# Patient Record
Sex: Female | Born: 1950 | Race: Black or African American | Hispanic: No | Marital: Single | Smoking: Never smoker
Health system: Southern US, Community
[De-identification: ages and names within clinical notes are randomized; demographics above are authoritative.]

## PROBLEM LIST (undated history)

## (undated) DIAGNOSIS — A159 Respiratory tuberculosis unspecified: Secondary | ICD-10-CM

## (undated) DIAGNOSIS — H269 Unspecified cataract: Secondary | ICD-10-CM

## (undated) DIAGNOSIS — H409 Unspecified glaucoma: Secondary | ICD-10-CM

## (undated) DIAGNOSIS — N189 Chronic kidney disease, unspecified: Secondary | ICD-10-CM

## (undated) DIAGNOSIS — M8580 Other specified disorders of bone density and structure, unspecified site: Secondary | ICD-10-CM

## (undated) HISTORY — DX: Respiratory tuberculosis unspecified: A15.9

## (undated) HISTORY — DX: Chronic kidney disease, unspecified: N18.9

## (undated) HISTORY — DX: Unspecified glaucoma: H40.9

## (undated) HISTORY — DX: Unspecified cataract: H26.9

## (undated) HISTORY — DX: Other specified disorders of bone density and structure, unspecified site: M85.80

## (undated) HISTORY — PX: TUBAL LIGATION: SHX77

---

## 2000-08-18 ENCOUNTER — Other Ambulatory Visit: Admission: RE | Admit: 2000-08-18 | Discharge: 2000-08-18 | Payer: Self-pay | Admitting: Obstetrics and Gynecology

## 2002-09-20 ENCOUNTER — Other Ambulatory Visit: Admission: RE | Admit: 2002-09-20 | Discharge: 2002-09-20 | Payer: Self-pay | Admitting: Obstetrics and Gynecology

## 2004-05-21 ENCOUNTER — Ambulatory Visit (HOSPITAL_COMMUNITY): Admission: RE | Admit: 2004-05-21 | Discharge: 2004-05-21 | Payer: Self-pay | Admitting: Obstetrics and Gynecology

## 2004-06-14 ENCOUNTER — Other Ambulatory Visit: Admission: RE | Admit: 2004-06-14 | Discharge: 2004-06-14 | Payer: Self-pay | Admitting: Obstetrics and Gynecology

## 2009-04-21 ENCOUNTER — Other Ambulatory Visit: Admission: RE | Admit: 2009-04-21 | Discharge: 2009-04-21 | Payer: Self-pay | Admitting: Obstetrics and Gynecology

## 2009-04-21 ENCOUNTER — Ambulatory Visit: Payer: Self-pay | Admitting: Obstetrics and Gynecology

## 2009-04-21 ENCOUNTER — Encounter: Payer: Self-pay | Admitting: Obstetrics and Gynecology

## 2009-05-05 ENCOUNTER — Ambulatory Visit: Payer: Self-pay | Admitting: Obstetrics and Gynecology

## 2009-06-09 ENCOUNTER — Ambulatory Visit: Payer: Self-pay | Admitting: Obstetrics and Gynecology

## 2010-04-22 ENCOUNTER — Other Ambulatory Visit: Admission: RE | Admit: 2010-04-22 | Discharge: 2010-04-22 | Payer: Self-pay | Admitting: Obstetrics and Gynecology

## 2010-04-22 ENCOUNTER — Ambulatory Visit: Payer: Self-pay | Admitting: Obstetrics and Gynecology

## 2011-08-02 ENCOUNTER — Encounter: Payer: Self-pay | Admitting: Obstetrics and Gynecology

## 2012-08-07 ENCOUNTER — Encounter: Payer: Self-pay | Admitting: Women's Health

## 2012-08-08 ENCOUNTER — Other Ambulatory Visit: Payer: Self-pay | Admitting: *Deleted

## 2012-08-08 ENCOUNTER — Encounter: Payer: Self-pay | Admitting: Women's Health

## 2012-08-08 DIAGNOSIS — R928 Other abnormal and inconclusive findings on diagnostic imaging of breast: Secondary | ICD-10-CM

## 2012-08-23 ENCOUNTER — Encounter: Payer: Self-pay | Admitting: Women's Health

## 2012-08-23 ENCOUNTER — Ambulatory Visit (INDEPENDENT_AMBULATORY_CARE_PROVIDER_SITE_OTHER): Payer: PRIVATE HEALTH INSURANCE | Admitting: Women's Health

## 2012-08-23 ENCOUNTER — Other Ambulatory Visit (HOSPITAL_COMMUNITY)
Admission: RE | Admit: 2012-08-23 | Discharge: 2012-08-23 | Disposition: A | Discharge status: Home / Self Care | Payer: PRIVATE HEALTH INSURANCE | Source: Ambulatory Visit | Attending: Obstetrics and Gynecology | Admitting: Obstetrics and Gynecology

## 2012-08-23 VITALS — BP 128/80 | Ht 66.75 in | Wt 150.0 lb

## 2012-08-23 DIAGNOSIS — Z01419 Encounter for gynecological examination (general) (routine) without abnormal findings: Secondary | ICD-10-CM

## 2012-08-23 DIAGNOSIS — M899 Disorder of bone, unspecified: Secondary | ICD-10-CM

## 2012-08-23 DIAGNOSIS — Z113 Encounter for screening for infections with a predominantly sexual mode of transmission: Secondary | ICD-10-CM

## 2012-08-23 DIAGNOSIS — Z780 Asymptomatic menopausal state: Secondary | ICD-10-CM

## 2012-08-23 DIAGNOSIS — Z833 Family history of diabetes mellitus: Secondary | ICD-10-CM

## 2012-08-23 DIAGNOSIS — Z13220 Encounter for screening for lipoid disorders: Secondary | ICD-10-CM

## 2012-08-23 DIAGNOSIS — M949 Disorder of cartilage, unspecified: Secondary | ICD-10-CM

## 2012-08-23 DIAGNOSIS — M8580 Other specified disorders of bone density and structure, unspecified site: Secondary | ICD-10-CM | POA: Insufficient documentation

## 2012-08-23 DIAGNOSIS — E079 Disorder of thyroid, unspecified: Secondary | ICD-10-CM

## 2012-08-23 LAB — TSH: TSH: 1.176 u[IU]/mL (ref 0.350–4.500)

## 2012-08-23 LAB — COMPREHENSIVE METABOLIC PANEL
ALT: 9 U/L (ref 0–35)
AST: 16 U/L (ref 0–37)
Albumin: 4.5 g/dL (ref 3.5–5.2)
Alkaline Phosphatase: 84 U/L (ref 39–117)
BUN: 15 mg/dL (ref 6–23)
Calcium: 9.7 mg/dL (ref 8.4–10.5)
Chloride: 108 mEq/L (ref 96–112)
Creat: 1.01 mg/dL (ref 0.50–1.10)
Glucose, Bld: 71 mg/dL (ref 70–99)
Potassium: 4.3 mEq/L (ref 3.5–5.3)
Total Bilirubin: 0.5 mg/dL (ref 0.3–1.2)
Total Protein: 6.8 g/dL (ref 6.0–8.3)

## 2012-08-23 LAB — CBC WITH DIFFERENTIAL/PLATELET
Basophils Absolute: 0.1 10*3/uL (ref 0.0–0.1)
Eosinophils Relative: 3 % (ref 0–5)
HCT: 41.2 % (ref 36.0–46.0)
Lymphocytes Relative: 47 % — ABNORMAL HIGH (ref 12–46)
Lymphs Abs: 1.7 10*3/uL (ref 0.7–4.0)
MCH: 28 pg (ref 26.0–34.0)
MCV: 84.1 fL (ref 78.0–100.0)
Monocytes Absolute: 0.3 10*3/uL (ref 0.1–1.0)
Neutro Abs: 1.5 10*3/uL — ABNORMAL LOW (ref 1.7–7.7)
Neutrophils Relative %: 41 % — ABNORMAL LOW (ref 43–77)
Platelets: 285 10*3/uL (ref 150–400)
RBC: 4.9 MIL/uL (ref 3.87–5.11)
RDW: 14.1 % (ref 11.5–15.5)
WBC: 3.7 10*3/uL — ABNORMAL LOW (ref 4.0–10.5)

## 2012-08-23 LAB — LIPID PANEL
HDL: 42 mg/dL (ref >39)
LDL Cholesterol: 97 mg/dL (ref 0–99)
Triglycerides: 127 mg/dL (ref <150)

## 2012-08-23 LAB — HEPATITIS C ANTIBODY: HCV Ab: NEGATIVE

## 2012-08-23 MED ORDER — CLOBETASOL PROPIONATE 0.05 % EX CREA: TOPICAL_CREAM | CUTANEOUS | Status: DC

## 2012-08-23 NOTE — Progress Notes (Signed)
REESA [name] December 09, 1950 [id number]    History:    The patient presents for annual exam.  Postmenopausal/no bleeding/ no HRT/new partner. Has not had a colonoscopy. Normal pap and mammogram history. Osteopenia/2010 - DEXA T score-1.5 at spine, total hip average -0.9.   Past medical history, past surgical history, family history and social history were all reviewed and documented in the EPIC chart. Engineer, site. 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   ROS:  A  ROS was performed and pertinent positives and negatives are included in the history.  Exam:  Filed Vitals:   08/23/12 0813  BP: 128/80    General appearance:  Normal Head/Neck:  Normal, without cervical or supraclavicular adenopathy. Thyroid:  Symmetrical, normal in size, without palpable masses or nodularity. Respiratory  Effort:  Normal  Auscultation:  Clear without wheezing or rhonchi Cardiovascular  Auscultation:  Regular rate, without rubs, murmurs or gallops  Edema/varicosities:  Not grossly evident Abdominal  Soft,nontender, without masses, guarding or rebound.  Liver/spleen:  No organomegaly noted  Hernia:  None appreciated  Skin  Inspection:  Grossly normal  Palpation:  Grossly normal Neurologic/psychiatric  Orientation:  Normal with appropriate conversation.  Mood/affect:  Normal  Genitourinary    Breasts: Examined lying and sitting.     Right: Without masses, retractions, discharge or axillary adenopathy.     Left: Without masses, retractions, discharge or axillary adenopathy.   Inguinal/mons:  Normal without inguinal adenopathy  External genitalia:  Erythematous/loss of pigment bilateral inguinal creases  BUS/Urethra/Skene's glands:  Normal  Bladder:  Normal  Vagina:  Normal  Cervix:  Normal  Uterus:   normal in size, shape and contour.  Midline and mobile  Adnexa/parametria:     Rt: Without masses or tenderness.   Lt: Without masses or tenderness.  Anus and perineum: Normal  Digital  rectal exam: Normal sphincter tone without palpated masses or tenderness  Assessment/Plan:  62 y.o. WBF G6P3 for annual exam with no complaints.     Postmenopausal/no bleeding/no HRT STD screen osteopenia  Plan: Instructed to schedule colonoscopy, reviewed importance of  screening. SBE's, schedule annual mammogram which is overdue. Reviewed importance of screening. Continue regular exercise, calcium rich diet, vitamin D 2000 daily. Home safety and fall prevention discussed. Repeat DEXA, instructed to schedule here. CBC, glucose, lipid panel, UA, Pap, GC/Chlamydia, hep C, declines HIV.Paps normal 2011, new screening guidelines reviewed. Instructed to use small amount of Temovate cream to erythematous areas on labia instructed to call if no relief of symptoms.    Denise [name] WHNP, 1:35 PM 08/23/2012

## 2012-08-23 NOTE — Patient Instructions (Signed)

## 2012-08-24 LAB — URINALYSIS W MICROSCOPIC + REFLEX CULTURE
Bacteria, UA: NONE SEEN
Bilirubin Urine: NEGATIVE
Casts: NONE SEEN
Crystals: NONE SEEN
Glucose, UA: NEGATIVE mg/dL
Hgb urine dipstick: NEGATIVE
Leukocytes, UA: NEGATIVE
Protein, ur: NEGATIVE mg/dL
Specific Gravity, Urine: 1.014 (ref 1.005–1.030)
Squamous Epithelial / HPF: NONE SEEN
Urobilinogen, UA: 0.2 mg/dL (ref 0.0–1.0)
pH: 5 (ref 5.0–8.0)

## 2012-08-24 LAB — GC/CHLAMYDIA PROBE AMP
CT Probe RNA: NEGATIVE
GC Probe RNA: NEGATIVE

## 2012-09-01 DIAGNOSIS — M8580 Other specified disorders of bone density and structure, unspecified site: Secondary | ICD-10-CM

## 2012-09-01 HISTORY — DX: Other specified disorders of bone density and structure, unspecified site: M85.80

## 2012-09-20 ENCOUNTER — Other Ambulatory Visit: Payer: Self-pay | Admitting: Gynecology

## 2012-09-20 ENCOUNTER — Encounter: Payer: Self-pay | Admitting: Gynecology

## 2012-09-20 ENCOUNTER — Ambulatory Visit (INDEPENDENT_AMBULATORY_CARE_PROVIDER_SITE_OTHER): Payer: PRIVATE HEALTH INSURANCE

## 2012-09-20 DIAGNOSIS — Z780 Asymptomatic menopausal state: Secondary | ICD-10-CM

## 2012-09-20 DIAGNOSIS — M949 Disorder of cartilage, unspecified: Secondary | ICD-10-CM

## 2012-09-20 DIAGNOSIS — M899 Disorder of bone, unspecified: Secondary | ICD-10-CM

## 2012-09-20 DIAGNOSIS — M8580 Other specified disorders of bone density and structure, unspecified site: Secondary | ICD-10-CM

## 2012-09-27 ENCOUNTER — Ambulatory Visit (INDEPENDENT_AMBULATORY_CARE_PROVIDER_SITE_OTHER): Payer: PRIVATE HEALTH INSURANCE | Admitting: Family Medicine

## 2012-09-27 VITALS — BP 110/70 | HR 72 | Temp 98.2°F | Resp 18 | Ht 66.75 in | Wt 151.8 lb

## 2012-09-27 DIAGNOSIS — J0100 Acute maxillary sinusitis, unspecified: Secondary | ICD-10-CM

## 2012-09-27 MED ORDER — BENZONATATE 100 MG PO CAPS: 100.0000 mg | ORAL_CAPSULE | Freq: Three times a day (TID) | ORAL | Status: DC | PRN

## 2012-09-27 MED ORDER — AMOXICILLIN-POT CLAVULANATE 875-125 MG PO TABS: 1.0000 | ORAL_TABLET | Freq: Two times a day (BID) | ORAL | Status: DC

## 2012-09-27 MED ORDER — IPRATROPIUM BROMIDE 0.03 % NA SOLN: 2.0000 | Freq: Two times a day (BID) | NASAL | Status: DC

## 2012-09-27 NOTE — Patient Instructions (Addendum)
Acute maxillary sinusitis - Plan: amoxicillin-clavulanate (AUGMENTIN) 875-125 MG per tablet, ipratropium (ATROVENT) 0.03 % nasal spray  Sinusitis Sinusitis is redness, soreness, and swelling (inflammation) of the paranasal sinuses. Paranasal sinuses are air pockets within the bones of your face (beneath the eyes, the middle of the forehead, or above the eyes). In healthy paranasal sinuses, mucus is able to drain out, and air is able to circulate through them by way of your nose. However, when your paranasal sinuses are inflamed, mucus and air can become trapped. This can allow bacteria and other germs to grow and cause infection. Sinusitis can develop quickly and last only a short time (acute) or continue over a long period (chronic). Sinusitis that lasts for more than 12 weeks is considered chronic.  CAUSES  Causes of sinusitis include:  Allergies.  Structural abnormalities, such as displacement of the cartilage that separates your nostrils (deviated septum), which can decrease the air flow through your nose and sinuses and affect sinus drainage.  Functional abnormalities, such as when the small hairs (cilia) that line your sinuses and help remove mucus do not work properly or are not present. SYMPTOMS  Symptoms of acute and chronic sinusitis are the same. The primary symptoms are pain and pressure around the affected sinuses. Other symptoms include:  Upper toothache.  Earache.  Headache.  Bad breath.  Decreased sense of smell and taste.  A cough, which worsens when you are lying flat.  Fatigue.  Fever.  Thick drainage from your nose, which often is green and may contain pus (purulent).  Swelling and warmth over the affected sinuses. DIAGNOSIS  Your caregiver will perform a physical exam. During the exam, your caregiver may:  Look in your nose for signs of abnormal growths in your nostrils (nasal polyps).  Tap over the affected sinus to check for signs of infection.  View  the inside of your sinuses (endoscopy) with a special imaging device with a light attached (endoscope), which is inserted into your sinuses. If your caregiver suspects that you have chronic sinusitis, one or more of the following tests may be recommended:  Allergy tests.  Nasal culture A sample of mucus is taken from your nose and sent to a lab and screened for bacteria.  Nasal cytology A sample of mucus is taken from your nose and examined by your caregiver to determine if your sinusitis is related to an allergy. TREATMENT  Most cases of acute sinusitis are related to a viral infection and will resolve on their own within 10 days. Sometimes medicines are prescribed to help relieve symptoms (pain medicine, decongestants, nasal steroid sprays, or saline sprays).  However, for sinusitis related to a bacterial infection, your caregiver will prescribe antibiotic medicines. These are medicines that will help kill the bacteria causing the infection.  Rarely, sinusitis is caused by a fungal infection. In theses cases, your caregiver will prescribe antifungal medicine. For some cases of chronic sinusitis, surgery is needed. Generally, these are cases in which sinusitis recurs more than 3 times per year, despite other treatments. HOME CARE INSTRUCTIONS   Drink plenty of water. Water helps thin the mucus so your sinuses can drain more easily.  Use a humidifier.  Inhale steam 3 to 4 times a day (for example, sit in the bathroom with the shower running).  Apply a warm, moist washcloth to your face 3 to 4 times a day, or as directed by your caregiver.  Use saline nasal sprays to help moisten and clean your sinuses.  Take  over-the-counter or prescription medicines for pain, discomfort, or fever only as directed by your caregiver. SEEK IMMEDIATE MEDICAL CARE IF:  You have increasing pain or severe headaches.  You have nausea, vomiting, or drowsiness.  You have swelling around your face.  You have  vision problems.  You have a stiff neck.  You have difficulty breathing. MAKE SURE YOU:   Understand these instructions.  Will watch your condition.  Will get help right away if you are not doing well or get worse. Document Released: 06/20/2005 Document Revised: 09/12/2011 Document Reviewed: 07/05/2011 [facility] Patient Information 2013 [place], [place].

## 2012-09-27 NOTE — Progress Notes (Signed)
[address]   [place], [place]  [address]   [id number]  Subjective:    Patient ID: Denise [name], female    DOB: 16-Jun-1951, 62 y.o.   MRN: [id number]  HPI This 62 y.o. female presents for evaluation of sinus congestion.  Onset two weeks ago.  Progressively worsening.  No fever/chills/sweats.  No headache but sinus pressure.  +R ear pain; no sore throat.   +rhinorrhea yellow green.  +nasal congestion.  +PND.  +coughing a lot; +sputum unknown.  No SOB.  No v/d.  Tried Mucinex with excessive drying.  No sneezing; no itchy eyes or nose.  No body aches.  No history of seasonal allergies.  PCP: [facility]    Review of Systems  Constitutional: Negative for fever, chills, diaphoresis and fatigue.  HENT: Positive for ear pain, congestion, rhinorrhea, voice change, postnasal drip and sinus pressure. Negative for sore throat, sneezing and trouble swallowing.   Respiratory: Positive for cough and shortness of breath. Negative for wheezing.   Gastrointestinal: Negative for nausea, vomiting and diarrhea.  Neurological: Negative for headaches.        Past Medical History  Diagnosis Date  . Osteopenia 09/2012     T score of -1.2 FRAX 6.6%/0.3%    Past Surgical History  Procedure Laterality Date  . Tubal ligation      Prior to Admission medications   Medication Sig Start Date End Date Taking? Authorizing Provider  amoxicillin-clavulanate (AUGMENTIN) 875-125 MG per tablet Take 1 tablet by mouth 2 (two) times daily. 09/27/12   Ethelda Chick, MD  benzonatate (TESSALON) 100 MG capsule Take 1-2 capsules (100-200 mg total) by mouth 3 (three) times daily as needed for cough. 09/27/12   Ethelda Chick, MD  clobetasol cream (TEMOVATE) 0.05 % Apply small amt twice daily as needed 08/23/12   Harrington Challenger, NP  ipratropium (ATROVENT) 0.03 % nasal spray Place 2 sprays into the nose 2 (two) times daily. 09/27/12   Ethelda Chick, MD    No Known Allergies  History   Social History  . Marital Status:  Single    Spouse Name: N/A    Number of Children: N/A  . Years of Education: N/A   Occupational History  . Not on file.   Social History Main Topics  . Smoking status: Never Smoker   . Smokeless tobacco: Never Used  . Alcohol Use: Yes     Comment: occ- wine  . Drug Use: No  . Sexually Active: Yes   Other Topics Concern  . Not on file   Social History Narrative  . No narrative on file    Family History  Problem Relation Age of Onset  . Hypertension Mother   . Heart disease Mother   . Diabetes Father   . Hypertension Father   . Diabetes Sister   . Tuberculosis Maternal Grandmother   . Heart disease Sister     Objective:   Physical Exam  Nursing note and vitals reviewed. Constitutional: She is oriented to person, place, and time. She appears well-developed and well-nourished. No distress.  HENT:  Head: Normocephalic and atraumatic.  Right Ear: External ear normal.  Left Ear: External ear normal.  Nose: Mucosal edema and rhinorrhea present. Right sinus exhibits no maxillary sinus tenderness and no frontal sinus tenderness. Left sinus exhibits no maxillary sinus tenderness and no frontal sinus tenderness.  Mouth/Throat: Oropharynx is clear and moist.  Eyes: Conjunctivae are normal. Pupils are equal, round, and reactive to light.  Neck: Normal range of motion. Neck supple.  Cardiovascular: Normal rate, regular rhythm and normal heart sounds.   No murmur heard. Pulmonary/Chest: Effort normal and breath sounds normal.  Lymphadenopathy:    She has no cervical adenopathy.  Neurological: She is alert and oriented to person, place, and time. No cranial nerve deficit. She exhibits normal muscle tone. Coordination normal.  Skin: She is not diaphoretic.  Psychiatric: She has a normal mood and affect. Her behavior is normal.       Assessment & Plan:  Acute maxillary sinusitis - Plan: amoxicillin-clavulanate (AUGMENTIN) 875-125 MG per tablet, ipratropium (ATROVENT) 0.03 %  nasal spray   1. Acute maxillary sinusitis:  New.  Rx for Augmentin, Tessalon Perles, Atrovent nasal spray provided. Supportive care with rest, fluids, Tylenol or Motrin.  Meds ordered this encounter  Medications  . amoxicillin-clavulanate (AUGMENTIN) 875-125 MG per tablet    Sig: Take 1 tablet by mouth 2 (two) times daily.    Dispense:  20 tablet    Refill:  0  . ipratropium (ATROVENT) 0.03 % nasal spray    Sig: Place 2 sprays into the nose 2 (two) times daily.    Dispense:  30 mL    Refill:  5  . benzonatate (TESSALON) 100 MG capsule    Sig: Take 1-2 capsules (100-200 mg total) by mouth 3 (three) times daily as needed for cough.    Dispense:  60 capsule    Refill:  0

## 2012-10-09 ENCOUNTER — Telehealth: Payer: Self-pay | Admitting: *Deleted

## 2012-10-09 NOTE — Telephone Encounter (Signed)
Pt sent a letter asking questions about her bone density since she received our summary of report. She asked what her Tscores were since she had significant decreases in all areas. I went over her hips are still in normal range and her spine is barely Osteopenic. We discussed exercise and calcium and vit D intake. KW

## 2012-10-29 ENCOUNTER — Encounter: Payer: Self-pay | Admitting: Family Medicine

## 2012-10-31 ENCOUNTER — Ambulatory Visit (INDEPENDENT_AMBULATORY_CARE_PROVIDER_SITE_OTHER): Payer: PRIVATE HEALTH INSURANCE | Admitting: Family Medicine

## 2012-10-31 VITALS — BP 130/72 | HR 66 | Temp 98.5°F | Resp 16 | Ht 67.0 in | Wt 152.0 lb

## 2012-10-31 DIAGNOSIS — R0982 Postnasal drip: Secondary | ICD-10-CM

## 2012-10-31 DIAGNOSIS — R05 Cough: Secondary | ICD-10-CM

## 2012-10-31 DIAGNOSIS — R490 Dysphonia: Secondary | ICD-10-CM

## 2012-10-31 DIAGNOSIS — R059 Cough, unspecified: Secondary | ICD-10-CM

## 2012-10-31 MED ORDER — BENZONATATE 100 MG PO CAPS: 100.0000 mg | ORAL_CAPSULE | Freq: Three times a day (TID) | ORAL | Status: DC | PRN

## 2012-10-31 MED ORDER — CETIRIZINE HCL 10 MG PO TABS: 10.0000 mg | ORAL_TABLET | Freq: Every day | ORAL | Status: DC

## 2012-10-31 MED ORDER — FLUTICASONE PROPIONATE 50 MCG/ACT NA SUSP: 2.0000 | Freq: Every day | NASAL | Status: DC

## 2012-10-31 NOTE — Progress Notes (Signed)
Subjective:    Patient ID: Denise [name], female    DOB: October 21, 1950, 62 y.o.   MRN: [id number] Chief Complaint  Patient presents with  . Cough    seen for sinusitis a month ago, can't kick the cough    HPI  Denise [name] is a delightful 62 yo woman who has been having a persistent cough and hoarseness for the past month since she was diagnosed and treated for sinusitis.  The sinus pressure and nasal congestion have been relieved.  Past Medical History  Diagnosis Date  . Osteopenia 09/2012     T score of -1.2 FRAX 6.6%/0.3%   Current Outpatient Prescriptions on File Prior to Visit  Medication Sig Dispense Refill  . clobetasol cream (TEMOVATE) 0.05 % Apply small amt twice daily as needed  30 g  1   No current facility-administered medications on file prior to visit.   No Known Allergies   Review of Systems  Constitutional: Positive for fatigue. Negative for fever, chills, diaphoresis and activity change.  HENT: Positive for trouble swallowing and voice change. Negative for congestion, sore throat and rhinorrhea.   Respiratory: Positive for cough. Negative for chest tightness, shortness of breath and wheezing.   Cardiovascular: Negative for chest pain, palpitations and leg swelling.  Musculoskeletal: Negative for back pain and arthralgias.  Hematological: Negative for adenopathy.  Psychiatric/Behavioral: Negative for sleep disturbance.      BP 130/72  Pulse 66  Temp(Src) 98.5 F (36.9 C) (Oral)  Resp 16  Ht 5\' 7"  (1.702 m)  Wt 152 lb (68.947 kg)  BMI 23.8 kg/m2  SpO2 100% Objective:   Physical Exam  Constitutional: She is oriented to person, place, and time. She appears well-developed and well-nourished. She does not appear ill. No distress.  HENT:  Head: Normocephalic and atraumatic.  Right Ear: Tympanic membrane, external ear and ear canal normal.  Left Ear: Tympanic membrane, external ear and ear canal normal.  Nose: Rhinorrhea present. No mucosal edema. Right sinus  exhibits no maxillary sinus tenderness. Left sinus exhibits no maxillary sinus tenderness.  Mouth/Throat: Uvula is midline and mucous membranes are normal. Posterior oropharyngeal erythema present. No oropharyngeal exudate or posterior oropharyngeal edema.  Hoarse gravely voice.  Eyes: Conjunctivae are normal. Right eye exhibits no discharge. Left eye exhibits no discharge. No scleral icterus.  Neck: Normal range of motion. Neck supple.  Cardiovascular: Normal rate, regular rhythm, normal heart sounds and intact distal pulses.   Pulmonary/Chest: Effort normal and breath sounds normal. Not tachypneic. No respiratory distress. She has no decreased breath sounds. She has no wheezes. She has no rhonchi. She has no rales.  Lymphadenopathy:    She has no cervical adenopathy.  Neurological: She is alert and oriented to person, place, and time.  Skin: Skin is warm and dry. She is not diaphoretic. No erythema.  Psychiatric: She has a normal mood and affect. Her behavior is normal.      Assessment & Plan:  Cough  Hoarseness, persistent  Postnasal drip - suspect sxs are due to persistent PND so start flonase and zyrtec. Cont prn tessalon as worked well prior.  If sxs continue for more than a month, rec referral to ENT.  Meds ordered this encounter  Medications  . benzonatate (TESSALON) 100 MG capsule    Sig: Take 1-2 capsules (100-200 mg total) by mouth 3 (three) times daily as needed for cough.    Dispense:  60 capsule    Refill:  0  . fluticasone (FLONASE) 50 MCG/ACT  nasal spray    Sig: Place 2 sprays into the nose daily.    Dispense:  16 g    Refill:  6  .  cetirizine (ZYRTEC) 10 MG tablet    Sig: Take 1 tablet (10 mg total) by mouth daily.    Dispense:  30 tablet    Refill:  11

## 2012-11-01 ENCOUNTER — Encounter: Payer: Self-pay | Admitting: Family Medicine

## 2012-11-02 MED ORDER — CETIRIZINE HCL 10 MG PO TABS: 10.0000 mg | ORAL_TABLET | Freq: Every day | ORAL | Status: DC

## 2012-12-11 ENCOUNTER — Ambulatory Visit (INDEPENDENT_AMBULATORY_CARE_PROVIDER_SITE_OTHER): Payer: PRIVATE HEALTH INSURANCE | Admitting: Family Medicine

## 2012-12-11 ENCOUNTER — Ambulatory Visit: Payer: PRIVATE HEALTH INSURANCE

## 2012-12-11 VITALS — BP 110/68 | HR 64 | Temp 98.2°F | Resp 16 | Ht 67.5 in | Wt 155.0 lb

## 2012-12-11 DIAGNOSIS — M545 Low back pain, unspecified: Secondary | ICD-10-CM

## 2012-12-11 MED ORDER — PREDNISONE 20 MG PO TABS: ORAL_TABLET | ORAL | Status: DC

## 2012-12-11 NOTE — Progress Notes (Addendum)
62 yo woman with low back pain since Thursday 5 days ago.  No trauma.  Getting slowly worse.   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 Made worse by bending forward. No numbness in feet No urinary problems or constipaton No past hx of LBP No fevers. No rash  Objective:   SLR:  Normal Hip ROM:  Normal Abdomen:  Soft, nontender without mass or HSM Back:  nontender Skin: no rash. [facility] reading (PRIMARY) by  Dr. Milus Glazier:  Spondylosis L5 S1.  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

## 2012-12-11 NOTE — Patient Instructions (Addendum)

## 2013-01-14 ENCOUNTER — Encounter: Payer: Self-pay | Admitting: Family Medicine

## 2013-01-14 DIAGNOSIS — R490 Dysphonia: Secondary | ICD-10-CM

## 2013-01-15 ENCOUNTER — Encounter: Payer: Self-pay | Admitting: Family Medicine

## 2013-05-09 ENCOUNTER — Other Ambulatory Visit: Payer: Self-pay

## 2013-08-12 ENCOUNTER — Encounter: Payer: Self-pay | Admitting: Women's Health

## 2013-08-26 ENCOUNTER — Encounter: Payer: PRIVATE HEALTH INSURANCE | Admitting: Women's Health

## 2013-08-27 ENCOUNTER — Encounter: Payer: Self-pay | Admitting: Women's Health

## 2013-08-29 ENCOUNTER — Encounter: Payer: Self-pay | Admitting: Women's Health

## 2013-09-02 ENCOUNTER — Ambulatory Visit: Payer: PRIVATE HEALTH INSURANCE | Admitting: Internal Medicine

## 2013-09-13 ENCOUNTER — Encounter: Payer: Self-pay | Admitting: Women's Health

## 2014-03-12 ENCOUNTER — Ambulatory Visit: Payer: PRIVATE HEALTH INSURANCE | Admitting: Internal Medicine

## 2014-03-27 ENCOUNTER — Encounter: Payer: Self-pay | Admitting: Women's Health

## 2014-03-27 ENCOUNTER — Ambulatory Visit (INDEPENDENT_AMBULATORY_CARE_PROVIDER_SITE_OTHER): Payer: PRIVATE HEALTH INSURANCE | Admitting: Women's Health

## 2014-03-27 VITALS — BP 142/74 | Ht 67.0 in | Wt 154.0 lb

## 2014-03-27 DIAGNOSIS — Z01419 Encounter for gynecological examination (general) (routine) without abnormal findings: Secondary | ICD-10-CM

## 2014-03-27 DIAGNOSIS — Z780 Asymptomatic menopausal state: Secondary | ICD-10-CM

## 2014-03-27 LAB — CBC WITH DIFFERENTIAL/PLATELET
BASOS ABS: 0 10*3/uL (ref 0.0–0.1)
BASOS PCT: 1 % (ref 0–1)
Eosinophils Absolute: 0.1 10*3/uL (ref 0.0–0.7)
Eosinophils Relative: 3 % (ref 0–5)
HCT: 40.4 % (ref 36.0–46.0)
Hemoglobin: 13.5 g/dL (ref 12.0–15.0)
Lymphocytes Relative: 43 % (ref 12–46)
Lymphs Abs: 1.6 10*3/uL (ref 0.7–4.0)
MCH: 27.5 pg (ref 26.0–34.0)
MCHC: 33.4 g/dL (ref 30.0–36.0)
MCV: 82.3 fL (ref 78.0–100.0)
Monocytes Absolute: 0.4 10*3/uL (ref 0.1–1.0)
Monocytes Relative: 10 % (ref 3–12)
Neutro Abs: 1.6 10*3/uL — ABNORMAL LOW (ref 1.7–7.7)
Neutrophils Relative %: 43 % (ref 43–77)
Platelets: 294 10*3/uL (ref 150–400)
RBC: 4.91 MIL/uL (ref 3.87–5.11)
RDW: 13.9 % (ref 11.5–15.5)
WBC: 3.8 10*3/uL — ABNORMAL LOW (ref 4.0–10.5)

## 2014-03-27 LAB — COMPREHENSIVE METABOLIC PANEL WITH GFR
ALT: 10 U/L (ref 0–35)
AST: 16 U/L (ref 0–37)
Albumin: 4.3 g/dL (ref 3.5–5.2)
Alkaline Phosphatase: 92 U/L (ref 39–117)
BUN: 18 mg/dL (ref 6–23)
CO2: 25 meq/L (ref 19–32)
Calcium: 9.4 mg/dL (ref 8.4–10.5)
Chloride: 107 meq/L (ref 96–112)
Creat: 1 mg/dL (ref 0.50–1.10)
Glucose, Bld: 93 mg/dL (ref 70–99)
Potassium: 4.1 meq/L (ref 3.5–5.3)
Sodium: 140 meq/L (ref 135–145)
Total Bilirubin: 0.5 mg/dL (ref 0.2–1.2)
Total Protein: 7 g/dL (ref 6.0–8.3)

## 2014-03-27 NOTE — Patient Instructions (Signed)
Health Recommendations for Postmenopausal Women Respected and ongoing research has looked at the most common causes of death, disability, and poor quality of life in postmenopausal women. The causes include heart disease, diseases of blood vessels, diabetes, depression, cancer, and bone loss (osteoporosis). Many things can be done to help lower the chances of developing these and other common problems. CARDIOVASCULAR DISEASE Heart Disease: A heart attack is a medical emergency. Know the signs and symptoms of a heart attack. Below are things women can do to reduce their risk for heart disease.   Do not smoke. If you smoke, quit.  Aim for a healthy weight. Being overweight causes many preventable deaths. Eat a healthy and balanced diet and drink an adequate amount of liquids.  Get moving. Make a commitment to be more physically active. Aim for 30 minutes of activity on most, if not all days of the week.  Eat for heart health. Choose a diet that is low in saturated fat and cholesterol and eliminate trans fat. Include whole grains, vegetables, and fruits. Read and understand the labels on food containers before buying.  Know your numbers. Ask your caregiver to check your blood pressure, cholesterol (total, HDL, LDL, triglycerides) and blood glucose. Work with your caregiver on improving your entire clinical picture.  High blood pressure. Limit or stop your table salt intake (try salt substitute and food seasonings). Avoid salty foods and drinks. Read labels on food containers before buying. Eating well and exercising can help control high blood pressure. STROKE  Stroke is a medical emergency. Stroke may be the result of a blood clot in a blood vessel in the brain or by a brain hemorrhage (bleeding). Know the signs and symptoms of a stroke. To lower the risk of developing a stroke:  Avoid fatty foods.  Quit smoking.  Control your diabetes, blood pressure, and irregular heart rate. THROMBOPHLEBITIS  (BLOOD CLOT) OF THE LEG  Becoming overweight and leading a stationary lifestyle may also contribute to developing blood clots. Controlling your diet and exercising will help lower the risk of developing blood clots. CANCER SCREENING  Breast Cancer: Take steps to reduce your risk of breast cancer.  You should practice "breast self-awareness." This means understanding the normal appearance and feel of your breasts and should include breast self-examination. Any changes detected, no matter how small, should be reported to your caregiver.  After age 40, you should have a clinical breast exam (CBE) every year.  Starting at age 40, you should consider having a mammogram (breast X-ray) every year.  If you have a family history of breast cancer, talk to your caregiver about genetic screening.  If you are at high risk for breast cancer, talk to your caregiver about having an MRI and a mammogram every year.  Intestinal or Stomach Cancer: Tests to consider are a rectal exam, fecal occult blood, sigmoidoscopy, and colonoscopy. Women who are high risk may need to be screened at an earlier age and more often.  Cervical Cancer:  Beginning at age 30, you should have a Pap test every 3 years as long as the past 3 Pap tests have been normal.  If you have had past treatment for cervical cancer or a condition that could lead to cancer, you need Pap tests and screening for cancer for at least 20 years after your treatment.  If you had a hysterectomy for a problem that was not cancer or a condition that could lead to cancer, then you no longer need Pap tests.    If you are between ages 65 and 70, and you have had normal Pap tests going back 10 years, you no longer need Pap tests.  If Pap tests have been discontinued, risk factors (such as a new sexual partner) need to be reassessed to determine if screening should be resumed.  Some medical problems can increase the chance of getting cervical cancer. In these  cases, your caregiver may recommend more frequent screening and Pap tests.  Uterine Cancer: If you have vaginal bleeding after reaching menopause, you should notify your caregiver.  Ovarian Cancer: Other than yearly pelvic exams, there are no reliable tests available to screen for ovarian cancer at this time except for yearly pelvic exams.  Lung Cancer: Yearly chest X-rays can detect lung cancer and should be done on high risk women, such as cigarette smokers and women with chronic lung disease (emphysema).  Skin Cancer: A complete body skin exam should be done at your yearly examination. Avoid overexposure to the sun and ultraviolet light lamps. Use a strong sun block cream when in the sun. All of these things are important for lowering the risk of skin cancer. MENOPAUSE Menopause Symptoms: Hormone therapy products are effective for treating symptoms associated with menopause:  Moderate to severe hot flashes.  Night sweats.  Mood swings.  Headaches.  Tiredness.  Loss of sex drive.  Insomnia.  Other symptoms. Hormone replacement carries certain risks, especially in older women. Women who use or are thinking about using estrogen or estrogen with progestin treatments should discuss that with their caregiver. Your caregiver will help you understand the benefits and risks. The ideal dose of hormone replacement therapy is not known. The Food and Drug Administration (FDA) has concluded that hormone therapy should be used only at the lowest doses and for the shortest amount of time to reach treatment goals.  OSTEOPOROSIS Protecting Against Bone Loss and Preventing Fracture If you use hormone therapy for prevention of bone loss (osteoporosis), the risks for bone loss must outweigh the risk of the therapy. Ask your caregiver about other medications known to be safe and effective for preventing bone loss and fractures. To guard against bone loss or fractures, the following is recommended:  If  you are younger than age 50, take 1000 mg of calcium and at least 600 mg of Vitamin D per day.  If you are older than age 50 but younger than age 70, take 1200 mg of calcium and at least 600 mg of Vitamin D per day.  If you are older than age 70, take 1200 mg of calcium and at least 800 mg of Vitamin D per day. Smoking and excessive alcohol intake increases the risk of osteoporosis. Eat foods rich in calcium and vitamin D and do weight bearing exercises several times a week as your caregiver suggests. DIABETES Diabetes Mellitus: If you have type I or type 2 diabetes, you should keep your blood sugar under control with diet, exercise, and recommended medication. Avoid starchy and fatty foods, and too many sweets. Being overweight can make diabetes control more difficult. COGNITION AND MEMORY Cognition and Memory: Menopausal hormone therapy is not recommended for the prevention of cognitive disorders such as Alzheimer's disease or memory loss.  DEPRESSION  Depression may occur at any age, but it is common in elderly women. This may be because of physical, medical, social (loneliness), or financial problems and needs. If you are experiencing depression because of medical problems and control of symptoms, talk to your caregiver about this. Physical   activity and exercise may help with mood and sleep. Community and volunteer involvement may improve your sense of value and worth. If you have depression and you feel that the problem is getting worse or becoming severe, talk to your caregiver about which treatment options are best for you. ACCIDENTS  Accidents are common and can be serious in elderly woman. Prepare your house to prevent accidents. Eliminate throw rugs, place hand bars in bath, shower, and toilet areas. Avoid wearing high heeled shoes or walking on wet, snowy, and icy areas. Limit or stop driving if you have vision or hearing problems, or if you feel you are unsteady with your movements and  reflexes. HEPATITIS C Hepatitis C is a type of viral infection affecting the liver. It is spread mainly through contact with blood from an infected person. It can be treated, but if left untreated, it can lead to severe liver damage over the years. Many people who are infected do not know that the virus is in their blood. If you are a "baby-boomer", it is recommended that you have one screening test for Hepatitis C. IMMUNIZATIONS  Several immunizations are important to consider having during your senior years, including:   Tetanus, diphtheria, and pertussis booster shot.  Influenza every year before the flu season begins.  Pneumonia vaccine.  Shingles vaccine.  Others, as indicated based on your specific needs. Talk to your caregiver about these. Document Released: 08/12/2005 Document Revised: 11/04/2013 Document Reviewed: 04/07/2008 [employer] Patient Information 2015 [employer]. This information is not intended to replace advice given to you by your health care provider. Make sure you discuss any questions you have with your health care provider.  

## 2014-03-27 NOTE — Progress Notes (Signed)
Denise [name] 1950-10-13 [id number]    History:    Presents for annual exam.  Postmenopausal/no bleeding/no HRT. Interested in scheduling colonoscopy. Normal pap and mammogram history. Osteopenia/2014 - DEXA T score-1.2 at spine FRAX 6.6%/0.3%.  No PCP.  Borderline BP today.       Past medical history, past surgical history, family history and social history were all reviewed and documented in the EPIC chart.  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   ROS:  A  12 point ROS was performed and pertinent positives and negatives are included.  Exam:  Filed Vitals:   03/27/14 0818  BP: 142/74    General appearance:  Normal Thyroid:  Symmetrical, normal in size, without palpable masses or nodularity. Respiratory  Auscultation:  Clear without wheezing or rhonchi Cardiovascular  Auscultation:  Regular rate, without rubs, murmurs or gallops  Edema/varicosities:  Not grossly evident Abdominal  Soft,nontender, without masses, guarding or rebound.  Liver/spleen:  No organomegaly noted  Hernia:  None appreciated  Skin  Inspection:  Grossly normal   Breasts: Examined lying and sitting.     Right: Without masses, retractions, discharge or axillary adenopathy.     Left: Without masses, retractions, discharge or axillary adenopathy. Gentitourinary   Inguinal/mons:  Normal without inguinal adenopathy  External genitalia:  Normal- mild erythema  BUS/Urethra/Skene's glands:  Normal  Vagina:  Normal  Cervix:  Normal  Uterus:  Normal in size, shape and contour.  Midline and mobile  Adnexa/parametria:     Rt: Without masses or tenderness.   Lt: Without masses or tenderness.  Anus and perineum: Normal  Digital rectal exam: Normal sphincter tone without palpated masses or tenderness  Assessment/Plan:  63 y.o. G6P3 WBF for annual exam.  Postmenopausal/no bleeding/no HRT  Osteopenia 2014 DEXA T  score -1.2 at spine FRAX 6.6%/0.3%. Borderline BP- no PCP   Plan: Instructed to schedule colonoscopy, reviewed importance of screening, [facility] number given.  Continue annual mammograms, SBEs, regular exercise, calcium rich diet, vitamin D 2000 daily. Home safety and fall prevention discussed.  CBC, CMP, Vitamin D,and  UA pending.  Paps normal 2014, new screening guidelines reviewed.  Encouraged to get Zostavax.  Influenza vaccine given.  Instructed to followup with primary care if blood pressure continues greater than 130/80.    Denise [name], 8:42 AM 03/27/2014

## 2014-03-28 LAB — URINALYSIS W MICROSCOPIC + REFLEX CULTURE
BILIRUBIN URINE: NEGATIVE
Bacteria, UA: NONE SEEN
Casts: NONE SEEN
Crystals: NONE SEEN
Glucose, UA: NEGATIVE mg/dL
HGB URINE DIPSTICK: NEGATIVE
KETONES UR: NEGATIVE mg/dL
Leukocytes, UA: NEGATIVE
Nitrite: NEGATIVE
PH: 5.5 (ref 5.0–8.0)
Protein, ur: NEGATIVE mg/dL
Specific Gravity, Urine: 1.012 (ref 1.005–1.030)
Squamous Epithelial / HPF: NONE SEEN
UROBILINOGEN UA: 1 mg/dL (ref 0.0–1.0)

## 2014-03-28 LAB — VITAMIN D 25 HYDROXY (VIT D DEFICIENCY, FRACTURES): Vit D, 25-Hydroxy: 27 ng/mL — ABNORMAL LOW (ref 30–89)

## 2014-03-31 ENCOUNTER — Other Ambulatory Visit: Payer: Self-pay | Admitting: Women's Health

## 2014-03-31 DIAGNOSIS — E559 Vitamin D deficiency, unspecified: Secondary | ICD-10-CM

## 2014-03-31 MED ORDER — VITAMIN D (ERGOCALCIFEROL) 1.25 MG (50000 UNIT) PO CAPS: 50000.0000 [IU] | ORAL_CAPSULE | ORAL | Status: DC

## 2014-05-05 ENCOUNTER — Encounter: Payer: Self-pay | Admitting: Women's Health

## 2014-06-30 ENCOUNTER — Other Ambulatory Visit: Payer: Self-pay

## 2014-07-01 ENCOUNTER — Other Ambulatory Visit: Payer: Self-pay

## 2014-08-05 ENCOUNTER — Other Ambulatory Visit: Payer: PRIVATE HEALTH INSURANCE

## 2014-08-05 DIAGNOSIS — E559 Vitamin D deficiency, unspecified: Secondary | ICD-10-CM

## 2014-08-07 ENCOUNTER — Encounter: Payer: Self-pay | Admitting: Women's Health

## 2014-08-08 ENCOUNTER — Telehealth: Payer: Self-pay

## 2014-08-08 ENCOUNTER — Encounter: Payer: Self-pay | Admitting: Women's Health

## 2014-08-08 LAB — VITAMIN D 1,25 DIHYDROXY
VITAMIN D2 1, 25 (OH): 31 pg/mL
Vitamin D 1, 25 (OH)2 Total: 50 pg/mL (ref 18–72)
Vitamin D3 1, 25 (OH)2: 19 pg/mL

## 2014-08-08 NOTE — Telephone Encounter (Addendum)
Patient emailed to ask about Vit D results from 08/05/14.  They appear to be normal and your previous recommendation was to have her take 2000 units Vit D from now on.  Ok to just let her know that? Just wanted to be sure you did not have anything else you wanted me to tell her.  I saved her email so that I can just reply back to her through Advice Request.

## 2014-08-08 NOTE — Telephone Encounter (Signed)
Yes, 2000 daily would be good

## 2014-08-08 NOTE — Telephone Encounter (Signed)
Emailed patient back and informed her result normal and to use 2000 Vit D3 daily.

## 2015-02-24 ENCOUNTER — Ambulatory Visit (INDEPENDENT_AMBULATORY_CARE_PROVIDER_SITE_OTHER): Payer: PRIVATE HEALTH INSURANCE | Admitting: Physician Assistant

## 2015-02-24 VITALS — BP 122/88 | HR 52 | Temp 98.3°F | Resp 16 | Ht 67.0 in | Wt 159.2 lb

## 2015-02-24 DIAGNOSIS — R21 Rash and other nonspecific skin eruption: Secondary | ICD-10-CM

## 2015-02-24 MED ORDER — CLOBETASOL PROPIONATE 0.05 % EX CREA: TOPICAL_CREAM | CUTANEOUS | Status: DC

## 2015-02-24 MED ORDER — DOXYCYCLINE HYCLATE 100 MG PO CAPS: 100.0000 mg | ORAL_CAPSULE | Freq: Two times a day (BID) | ORAL | Status: AC

## 2015-02-24 NOTE — Progress Notes (Signed)
[facility] [address] [place] [address] [phone]  Date:  02/24/2015   Name:  Denise [name]   DOB:  May 10, 1951   MRN:  [id number]  PCP:  No PCP Per Patient    History of Present Illness:  Denise [name] is a 64 y.o. female patient who presents to [facility] for for rash on abdomen and left leg for 10 days.  Patient first noticed a rash on her abdomen.  It was pruritic.  No drainage or pain.  She applied clobetasol cream once which helped.  Then 3 days ago, she had erythematous swelling that is also pruritic on the outside of left thigh.  She did not notice a bite, or tick.  It has a red ring.  There is no fever, body aches, nausea, or vomiting.  No new use of medication.  She has no pets.  She works out daily.       Patient Active Problem List   Diagnosis Date Noted  . Osteopenia 08/23/2012    Past Medical History  Diagnosis Date  . Osteopenia 09/2012     T score of -1.2 FRAX 6.6%/0.3%  . Tuberculosis     Past Surgical History  Procedure Laterality Date  . Tubal ligation      Social History  Substance Use Topics  . Smoking status: Never Smoker   . Smokeless tobacco: Never Used  . Alcohol Use: Yes     Comment: occ- wine    Family History  Problem Relation Age of Onset  . Hypertension Mother   . Heart disease Mother   . Diabetes Father   . Hypertension Father   . Diabetes Sister   . Tuberculosis Maternal Grandmother   . Heart disease Sister     No Known Allergies  Medication list has been reviewed and updated.  Current Outpatient Prescriptions on File Prior to Visit  Medication Sig Dispense Refill  . cetirizine (ZYRTEC) 10 MG tablet Take 10 mg by mouth as needed.    . fluticasone (FLONASE) 50 MCG/ACT nasal spray Place 2 sprays into the nose as needed.    . Vitamin D, Ergocalciferol, (DRISDOL) 50000 UNITS CAPS capsule Take 1 capsule (50,000 Units total) by mouth every 7 (seven) days. (Patient not taking: Reported on 02/24/2015) 12  capsule 0   No current facility-administered medications on file prior to visit.    ROS ROS otherwise unremarkable unless listed above.   Physical Examination: BP 122/88 mmHg  Pulse 52  Temp(Src) 98.3 F (36.8 C) (Oral)  Resp 16  Ht 5\' 7"  (1.702 m)  Wt 159 lb 3.2 oz (72.213 kg)  BMI 24.93 kg/m2  SpO2 92% Ideal Body Weight: Weight in (lb) to have BMI = 25: 159.3  Physical Exam  Constitutional: She is oriented to person, place, and time. She appears well-developed and well-nourished. No distress.  HENT:  Head: Normocephalic and atraumatic.  Eyes: EOM are normal. Pupils are equal, round, and reactive to light.  Cardiovascular: Normal rate and regular rhythm.  Exam reveals no gallop and no friction rub.   No murmur heard. Pulmonary/Chest: Effort normal and breath sounds normal. No respiratory distress. She has no wheezes.  Neurological: She is alert and oriented to person, place, and time.  Skin: Skin is warm and dry. She is not diaphoretic.  -Erythematous annular raised non-tender lesion on left lateral thigh with mildly erythematous annular surrounding of wider circumference..  No drainage.   -Left abdomen with three small non-raised hyperpigmented lesions.  No drainage, non-tender.  Linear formation.  Psychiatric: She has a normal mood and affect. Her behavior is normal.     Assessment and Plan: 64 year old female is here today for chief complaint of rash.  Separate rashing.  Insect (bug, tic).  Will treat for infection and possible lyme exposure.    1. Rash - doxycycline (VIBRAMYCIN) 100 MG capsule; Take 1 capsule (100 mg total) by mouth 2 (two) times daily.  Dispense: 20 capsule; Refill: 0 - clobetasol cream (TEMOVATE) 0.05 %; Apply small amt twice daily as needed  Dispense: 30 g; Refill: 1   Ivar Drape, PA-C [facility] 02/24/2015 1:15 PM

## 2015-02-24 NOTE — Patient Instructions (Addendum)
Please use the clobetasol twice per day.  Make sure you are hydrating well. Rash A rash is a change in the color or texture of your skin. There are many different types of rashes. You may have other problems that accompany your rash. CAUSES   Infections.  Allergic reactions. This can include allergies to pets or foods.  Certain medicines.  Exposure to certain chemicals, soaps, or cosmetics.  Heat.  Exposure to poisonous plants.  Tumors, both cancerous and noncancerous. SYMPTOMS   Redness.  Scaly skin.  Itchy skin.  Dry or cracked skin.  Bumps.  Blisters.  Pain. DIAGNOSIS  Your caregiver may do a physical exam to determine what type of rash you have. A skin sample (biopsy) may be taken and examined under a microscope. TREATMENT  Treatment depends on the type of rash you have. Your caregiver may prescribe certain medicines. For serious conditions, you may need to see a skin doctor (dermatologist). HOME CARE INSTRUCTIONS   Avoid the substance that caused your rash.  Do not scratch your rash. This can cause infection.  You may take cool baths to help stop itching.  Only take over-the-counter or prescription medicines as directed by your caregiver.  Keep all follow-up appointments as directed by your caregiver. SEEK IMMEDIATE MEDICAL CARE IF:  You have increasing pain, swelling, or redness.  You have a fever.  You have new or severe symptoms.  You have body aches, diarrhea, or vomiting.  Your rash is not better after 3 days. MAKE SURE YOU:  Understand these instructions.  Will watch your condition.  Will get help right away if you are not doing well or get worse. Document Released: 06/10/2002 Document Revised: 09/12/2011 Document Reviewed: 04/04/2011 [facility] Patient Information 2015 [place], [place]. This information is not intended to replace advice given to you by your health care provider. Make sure you discuss any questions you have with your health  care provider.

## 2015-03-17 ENCOUNTER — Encounter: Payer: Self-pay | Admitting: Physician Assistant

## 2015-03-17 ENCOUNTER — Ambulatory Visit (INDEPENDENT_AMBULATORY_CARE_PROVIDER_SITE_OTHER): Payer: PRIVATE HEALTH INSURANCE | Admitting: Physician Assistant

## 2015-03-17 VITALS — BP 124/70 | HR 54 | Temp 98.3°F | Resp 16 | Ht 67.0 in | Wt 155.0 lb

## 2015-03-17 DIAGNOSIS — H9193 Unspecified hearing loss, bilateral: Secondary | ICD-10-CM | POA: Diagnosis not present

## 2015-03-17 DIAGNOSIS — Z1211 Encounter for screening for malignant neoplasm of colon: Secondary | ICD-10-CM | POA: Diagnosis not present

## 2015-03-17 DIAGNOSIS — H9313 Tinnitus, bilateral: Secondary | ICD-10-CM

## 2015-03-17 DIAGNOSIS — Z23 Encounter for immunization: Secondary | ICD-10-CM

## 2015-03-17 NOTE — Patient Instructions (Addendum)
Await the contact for ear nose and throat doctor's appointment.    Colonoscopy A colonoscopy is an exam to look at your colon. This exam can help find lumps (tumors), growths (polyps), bleeding, and redness and puffiness (inflammation) in your colon.  BEFORE THE PROCEDURE  Ask your doctor about changing or stopping your regular medicines.  You may need to drink a large amount of a special liquid (oral bowel prep). You start drinking this the day before your procedure. It will cause you to have watery poop (stool). This cleans out your colon.  Do not eat or drink anything else once you have started the bowel prep, unless your doctor tells you it is safe to do so.  Make plans for someone to drive you home after the procedure. PROCEDURE  You will be given medicine to help you relax (sedative).  You will lie on your side with your knees bent.  A tube with a camera on the end is put in the opening of your butt (anus) and into your colon. Pictures are sent to a computer screen. Your doctor will look for anything that is not normal.  Your doctor may take a tissue sample (biopsy) from your colon to be looked at more closely.  The exam is finished when your doctor has viewed all of the colon. AFTER THE PROCEDURE  Do not drive for 24 hours after the exam.  You may have a small amount of blood in your poop. This is normal.  You may pass gas and have belly (abdominal) cramps. This is normal.  Ask when your test results will be ready. Make sure you get your test results. Document Released: 07/23/2010 Document Revised: 06/25/2013 Document Reviewed: 02/25/2013 [facility] Patient Information 2015 [place], [place]. This information is not intended to replace advice given to you by your health care provider. Make sure you discuss any questions you have with your health care provider.

## 2015-03-18 ENCOUNTER — Encounter: Payer: Self-pay | Admitting: Gastroenterology

## 2015-03-19 NOTE — Progress Notes (Signed)
[facility] [address], [place] [address] [phone]  Date:  03/17/2015   Name:  Denise [name]   DOB:  02/12/1951   MRN:  [id number]  PCP:  No PCP Per Patient    History of Present Illness:  Denise [name] is a 64 y.o. female patient who presents to [facility] to establish care and to discuss hearing loss.  She has difficulty with hearing for several months.  It occurs in noisy environments where it becomes hard to distinct voices.  She also complains of difficulty with hearing over the phone.  She has no ear pain, fullness, but does complain of buzzing sound.  She has no hx of ear infection or UR sxs.  No dysequilibrium.    Patient Active Problem List   Diagnosis Date Noted  . Osteopenia 08/23/2012    Past Medical History  Diagnosis Date  . Osteopenia 09/2012     T score of -1.2 FRAX 6.6%/0.3%  . Tuberculosis     Past Surgical History  Procedure Laterality Date  . Tubal ligation      Social History  Substance Use Topics  . Smoking status: Never Smoker   . Smokeless tobacco: Never Used  . Alcohol Use: Yes     Comment: occ- wine    Family History  Problem Relation Age of Onset  . Hypertension Mother   . Heart disease Mother   . Diabetes Father   . Hypertension Father   . Diabetes Sister   . Tuberculosis Maternal Grandmother   . Heart disease Sister     No Known Allergies  Medication list has been reviewed and updated.  Current Outpatient Prescriptions on File Prior to Visit  Medication Sig Dispense Refill  . CALCIUM CARBONATE PO Take 2,000 mg by mouth.    . Travoprost, BAK Free, (TRAVATAN) 0.004 % SOLN ophthalmic solution 1 drop at bedtime.     No current facility-administered medications on file prior to visit.    ROS ROS otherwise unremarkable unless listed above.   Physical Examination: BP 124/70 mmHg  Pulse 54  Temp(Src) 98.3 F (36.8 C)  Resp 16  Ht 5\' 7"  (1.702 m)  Wt 155 lb (70.308 kg)  BMI 24.27 kg/m2 Ideal Body  Weight: Weight in (lb) to have BMI = 25: 159.3  Physical Exam  Constitutional: She is oriented to person, place, and time. She appears well-developed and well-nourished. No distress.  HENT:  Head: Normocephalic and atraumatic.  Right Ear: Tympanic membrane, external ear and ear canal normal.  Left Ear: Tympanic membrane, external ear and ear canal normal.  AC>BC Successful whisper test   Eyes: Conjunctivae and EOM are normal. Pupils are equal, round, and reactive to light.  Cardiovascular: Normal rate, regular rhythm and normal heart sounds.  Exam reveals no gallop and no friction rub.   No murmur heard. Pulses:      Radial pulses are 2+ on the right side, and 2+ on the left side.       Posterior tibial pulses are 2+ on the right side, and 2+ on the left side.  Pulmonary/Chest: Effort normal. No respiratory distress.  Neurological: She is alert and oriented to person, place, and time.  Skin: She is not diaphoretic.  Psychiatric: She has a normal mood and affect. Her behavior is normal.     Assessment and Plan: 64 year old female is here today for chief complaint of hearing loss, and to establish care.  -Colonoscopy needed at this time.  She has agreed to this screening.  Referral sent -Flu test given -Referral to ENT with consult appreciated at this time.   Hearing difficulty of both ears - Plan: Ambulatory referral to ENT  Need for prophylactic vaccination and inoculation against influenza - Plan: Flu Vaccine QUAD 36+ mos IM  Tinnitus, bilateral - Plan: Ambulatory referral to ENT  Screening for colon cancer - Plan: Ambulatory referral to Gastroenterology     Ivar Drape, PA-C [facility] 03/19/2015 1:08 PM

## 2015-04-02 ENCOUNTER — Encounter: Payer: Self-pay | Admitting: Women's Health

## 2015-04-02 ENCOUNTER — Other Ambulatory Visit (HOSPITAL_COMMUNITY)
Admission: RE | Admit: 2015-04-02 | Discharge: 2015-04-02 | Disposition: A | Discharge status: Home / Self Care | Payer: PRIVATE HEALTH INSURANCE | Source: Ambulatory Visit | Attending: Women's Health | Admitting: Women's Health

## 2015-04-02 ENCOUNTER — Encounter: Payer: Self-pay | Admitting: Physician Assistant

## 2015-04-02 ENCOUNTER — Ambulatory Visit (INDEPENDENT_AMBULATORY_CARE_PROVIDER_SITE_OTHER): Payer: PRIVATE HEALTH INSURANCE | Admitting: Women's Health

## 2015-04-02 VITALS — BP 122/80 | Ht 67.0 in | Wt 154.0 lb

## 2015-04-02 DIAGNOSIS — Z1151 Encounter for screening for human papillomavirus (HPV): Secondary | ICD-10-CM | POA: Diagnosis not present

## 2015-04-02 DIAGNOSIS — Z01419 Encounter for gynecological examination (general) (routine) without abnormal findings: Secondary | ICD-10-CM | POA: Insufficient documentation

## 2015-04-02 LAB — CBC WITH DIFFERENTIAL/PLATELET
BASOS PCT: 1 % (ref 0–1)
Basophils Absolute: 0 10*3/uL (ref 0.0–0.1)
EOS ABS: 0.1 10*3/uL (ref 0.0–0.7)
Eosinophils Relative: 2 % (ref 0–5)
HCT: 42.2 % (ref 36.0–46.0)
Hemoglobin: 14.2 g/dL (ref 12.0–15.0)
Lymphocytes Relative: 43 % (ref 12–46)
Lymphs Abs: 1.6 10*3/uL (ref 0.7–4.0)
MCH: 28 pg (ref 26.0–34.0)
MCHC: 33.6 g/dL (ref 30.0–36.0)
MCV: 83.2 fL (ref 78.0–100.0)
MONO ABS: 0.2 10*3/uL (ref 0.1–1.0)
MONOS PCT: 6 % (ref 3–12)
MPV: 9.9 fL (ref 8.6–12.4)
Neutro Abs: 1.8 10*3/uL (ref 1.7–7.7)
Neutrophils Relative %: 48 % (ref 43–77)
PLATELETS: 293 10*3/uL (ref 150–400)
RBC: 5.07 MIL/uL (ref 3.87–5.11)
RDW: 14 % (ref 11.5–15.5)
WBC: 3.8 10*3/uL — AB (ref 4.0–10.5)

## 2015-04-02 LAB — COMPREHENSIVE METABOLIC PANEL
ALT: 10 U/L (ref 6–29)
AST: 15 U/L (ref 10–35)
Albumin: 4.2 g/dL (ref 3.6–5.1)
Alkaline Phosphatase: 86 U/L (ref 33–130)
BUN: 10 mg/dL (ref 7–25)
CHLORIDE: 108 mmol/L (ref 98–110)
CO2: 24 mmol/L (ref 20–31)
CREATININE: 1.04 mg/dL — AB (ref 0.50–0.99)
Calcium: 9.6 mg/dL (ref 8.6–10.4)
GLUCOSE: 90 mg/dL (ref 65–99)
Potassium: 3.8 mmol/L (ref 3.5–5.3)
SODIUM: 141 mmol/L (ref 135–146)
Total Bilirubin: 0.7 mg/dL (ref 0.2–1.2)
Total Protein: 6.8 g/dL (ref 6.1–8.1)

## 2015-04-02 LAB — LIPID PANEL
Cholesterol: 186 mg/dL (ref 125–200)
HDL: 45 mg/dL — ABNORMAL LOW (ref >=46)
LDL CALC: 122 mg/dL (ref <130)
TRIGLYCERIDES: 93 mg/dL (ref <150)
Total CHOL/HDL Ratio: 4.1 Ratio (ref <=5.0)
VLDL: 19 mg/dL (ref <30)

## 2015-04-02 LAB — TSH: TSH: 1.456 u[IU]/mL (ref 0.350–4.500)

## 2015-04-02 NOTE — Patient Instructions (Signed)
Health Recommendations for Postmenopausal Women Respected and ongoing research has looked at the most common causes of death, disability, and poor quality of life in postmenopausal women. The causes include heart disease, diseases of blood vessels, diabetes, depression, cancer, and bone loss (osteoporosis). Many things can be done to help lower the chances of developing these and other common problems. CARDIOVASCULAR DISEASE Heart Disease: A heart attack is a medical emergency. Know the signs and symptoms of a heart attack. Below are things women can do to reduce their risk for heart disease.   Do not smoke. If you smoke, quit.  Aim for a healthy weight. Being overweight causes many preventable deaths. Eat a healthy and balanced diet and drink an adequate amount of liquids.  Get moving. Make a commitment to be more physically active. Aim for 30 minutes of activity on most, if not all days of the week.  Eat for heart health. Choose a diet that is low in saturated fat and cholesterol and eliminate trans fat. Include whole grains, vegetables, and fruits. Read and understand the labels on food containers before buying.  Know your numbers. Ask your caregiver to check your blood pressure, cholesterol (total, HDL, LDL, triglycerides) and blood glucose. Work with your caregiver on improving your entire clinical picture.  High blood pressure. Limit or stop your table salt intake (try salt substitute and food seasonings). Avoid salty foods and drinks. Read labels on food containers before buying. Eating well and exercising can help control high blood pressure. STROKE  Stroke is a medical emergency. Stroke may be the result of a blood clot in a blood vessel in the brain or by a brain hemorrhage (bleeding). Know the signs and symptoms of a stroke. To lower the risk of developing a stroke:  Avoid fatty foods.  Quit smoking.  Control your diabetes, blood pressure, and irregular heart rate. THROMBOPHLEBITIS  (BLOOD CLOT) OF THE LEG  Becoming overweight and leading a stationary lifestyle may also contribute to developing blood clots. Controlling your diet and exercising will help lower the risk of developing blood clots. CANCER SCREENING  Breast Cancer: Take steps to reduce your risk of breast cancer.  You should practice "breast self-awareness." This means understanding the normal appearance and feel of your breasts and should include breast self-examination. Any changes detected, no matter how small, should be reported to your caregiver.  After age 40, you should have a clinical breast exam (CBE) every year.  Starting at age 40, you should consider having a mammogram (breast X-ray) every year.  If you have a family history of breast cancer, talk to your caregiver about genetic screening.  If you are at high risk for breast cancer, talk to your caregiver about having an MRI and a mammogram every year.  Intestinal or Stomach Cancer: Tests to consider are a rectal exam, fecal occult blood, sigmoidoscopy, and colonoscopy. Women who are high risk may need to be screened at an earlier age and more often.  Cervical Cancer:  Beginning at age 30, you should have a Pap test every 3 years as long as the past 3 Pap tests have been normal.  If you have had past treatment for cervical cancer or a condition that could lead to cancer, you need Pap tests and screening for cancer for at least 20 years after your treatment.  If you had a hysterectomy for a problem that was not cancer or a condition that could lead to cancer, then you no longer need Pap tests.    If you are between ages 65 and 70, and you have had normal Pap tests going back 10 years, you no longer need Pap tests.  If Pap tests have been discontinued, risk factors (such as a new sexual partner) need to be reassessed to determine if screening should be resumed.  Some medical problems can increase the chance of getting cervical cancer. In these  cases, your caregiver may recommend more frequent screening and Pap tests.  Uterine Cancer: If you have vaginal bleeding after reaching menopause, you should notify your caregiver.  Ovarian Cancer: Other than yearly pelvic exams, there are no reliable tests available to screen for ovarian cancer at this time except for yearly pelvic exams.  Lung Cancer: Yearly chest X-rays can detect lung cancer and should be done on high risk women, such as cigarette smokers and women with chronic lung disease (emphysema).  Skin Cancer: A complete body skin exam should be done at your yearly examination. Avoid overexposure to the sun and ultraviolet light lamps. Use a strong sun block cream when in the sun. All of these things are important for lowering the risk of skin cancer. MENOPAUSE Menopause Symptoms: Hormone therapy products are effective for treating symptoms associated with menopause:  Moderate to severe hot flashes.  Night sweats.  Mood swings.  Headaches.  Tiredness.  Loss of sex drive.  Insomnia.  Other symptoms. Hormone replacement carries certain risks, especially in older women. Women who use or are thinking about using estrogen or estrogen with progestin treatments should discuss that with their caregiver. Your caregiver will help you understand the benefits and risks. The ideal dose of hormone replacement therapy is not known. The Food and Drug Administration (FDA) has concluded that hormone therapy should be used only at the lowest doses and for the shortest amount of time to reach treatment goals.  OSTEOPOROSIS Protecting Against Bone Loss and Preventing Fracture If you use hormone therapy for prevention of bone loss (osteoporosis), the risks for bone loss must outweigh the risk of the therapy. Ask your caregiver about other medications known to be safe and effective for preventing bone loss and fractures. To guard against bone loss or fractures, the following is recommended:  If  you are younger than age 50, take 1000 mg of calcium and at least 600 mg of Vitamin D per day.  If you are older than age 50 but younger than age 70, take 1200 mg of calcium and at least 600 mg of Vitamin D per day.  If you are older than age 70, take 1200 mg of calcium and at least 800 mg of Vitamin D per day. Smoking and excessive alcohol intake increases the risk of osteoporosis. Eat foods rich in calcium and vitamin D and do weight bearing exercises several times a week as your caregiver suggests. DIABETES Diabetes Mellitus: If you have type I or type 2 diabetes, you should keep your blood sugar under control with diet, exercise, and recommended medication. Avoid starchy and fatty foods, and too many sweets. Being overweight can make diabetes control more difficult. COGNITION AND MEMORY Cognition and Memory: Menopausal hormone therapy is not recommended for the prevention of cognitive disorders such as Alzheimer's disease or memory loss.  DEPRESSION  Depression may occur at any age, but it is common in elderly women. This may be because of physical, medical, social (loneliness), or financial problems and needs. If you are experiencing depression because of medical problems and control of symptoms, talk to your caregiver about this. Physical   activity and exercise may help with mood and sleep. Community and volunteer involvement may improve your sense of value and worth. If you have depression and you feel that the problem is getting worse or becoming severe, talk to your caregiver about which treatment options are best for you. ACCIDENTS  Accidents are common and can be serious in elderly woman. Prepare your house to prevent accidents. Eliminate throw rugs, place hand bars in bath, shower, and toilet areas. Avoid wearing high heeled shoes or walking on wet, snowy, and icy areas. Limit or stop driving if you have vision or hearing problems, or if you feel you are unsteady with your movements and  reflexes. HEPATITIS C Hepatitis C is a type of viral infection affecting the liver. It is spread mainly through contact with blood from an infected person. It can be treated, but if left untreated, it can lead to severe liver damage over the years. Many people who are infected do not know that the virus is in their blood. If you are a "baby-boomer", it is recommended that you have one screening test for Hepatitis C. IMMUNIZATIONS  Several immunizations are important to consider having during your senior years, including:   Tetanus, diphtheria, and pertussis booster shot.  Influenza every year before the flu season begins.  Pneumonia vaccine.  Shingles vaccine.  Others, as indicated based on your specific needs. Talk to your caregiver about these. Document Released: 08/12/2005 Document Revised: 11/04/2013 Document Reviewed: 04/07/2008 [employer] Patient Information 2015 [employer]. This information is not intended to replace advice given to you by your health care provider. Make sure you discuss any questions you have with your health care provider.  

## 2015-04-02 NOTE — Progress Notes (Signed)
Denise [name] [birth year] [id number]    History:    Presents for annual exam.  Postmenopausal/no HRT. Normal Pap and mammogram history. Same partner greater than 5 years, widow (Husband died of heart disease and diabetes.)  09/2012 T score -1.2, hip average -0.9 FRAX 6.6%/0.3%. Has not had a colonoscopy but has it scheduled. Has not had Zostavax, has annual flu vaccine.  Past medical history, past surgical history, family history and social history were all reviewed and documented in the EPIC chart. 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 Has 3 children son in [place], another son in [place] daughter in [place]. 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  ROS:  A ROS was performed and pertinent positives and negatives are included.  Exam:  Filed Vitals:   04/02/15 0822  BP: 122/80    General appearance:  Normal Thyroid:  Symmetrical, normal in size, without palpable masses or nodularity. Respiratory  Auscultation:  Clear without wheezing or rhonchi Cardiovascular  Auscultation:  Regular rate, without rubs, murmurs or gallops  Edema/varicosities:  Not grossly evident Abdominal  Soft,nontender, without masses, guarding or rebound.  Liver/spleen:  No organomegaly noted  Hernia:  None appreciated  Skin  Inspection:  Grossly normal   Breasts: Examined lying and sitting.     Right: Without masses, retractions, discharge or axillary adenopathy.     Left: Without masses, retractions, discharge or axillary adenopathy. Gentitourinary   Inguinal/mons:  Normal without inguinal adenopathy  External genitalia:  Normal  BUS/Urethra/Skene's glands:  Normal  Vagina:  Normal  Cervix:  Normal  Uterus:  normal in size, shape and contour.  Midline and mobile  Adnexa/parametria:     Rt: Without masses or tenderness.   Lt: Without masses or tenderness.  Anus and perineum: Normal  Digital rectal exam: Normal sphincter tone without palpated masses or tenderness  Assessment/Plan:  64 y.o. WBF G3 P3 for  annual exam with no complaints.  Plan: SBE's had annual screening mammogram yesterday, calcium rich diet, vitamin D 2000 daily encouraged. Home safety, fall prevention and importance of regular weightbearing exercise reviewed. Keep scheduled colonoscopy. CBC, CMP, lipid panel, vitamin D, TSH, UA, Pap with HR HPV typing, new screening guidelines reviewed. Zostavax encouraged. Will repeat DEXA next year.  Huel Cote [facility], 8:49 AM 04/02/2015

## 2015-04-02 NOTE — Addendum Note (Signed)
Addended by: Burnett Kanaris on: 04/02/2015 09:00 AM   Modules accepted: Orders

## 2015-04-03 ENCOUNTER — Other Ambulatory Visit: Payer: Self-pay | Admitting: Women's Health

## 2015-04-03 DIAGNOSIS — R7989 Other specified abnormal findings of blood chemistry: Secondary | ICD-10-CM

## 2015-04-03 DIAGNOSIS — E559 Vitamin D deficiency, unspecified: Secondary | ICD-10-CM

## 2015-04-03 LAB — URINALYSIS W MICROSCOPIC + REFLEX CULTURE
Bilirubin Urine: NEGATIVE
Casts: NONE SEEN [LPF]
Crystals: NONE SEEN [HPF]
GLUCOSE, UA: NEGATIVE
HGB URINE DIPSTICK: NEGATIVE
Ketones, ur: NEGATIVE
NITRITE: NEGATIVE
PH: 6 (ref 5.0–8.0)
Protein, ur: NEGATIVE
RBC / HPF: NONE SEEN RBC/HPF (ref <=2)
SPECIFIC GRAVITY, URINE: 1.006 (ref 1.001–1.035)
SQUAMOUS EPITHELIAL / LPF: NONE SEEN [HPF] (ref <=5)
WBC, UA: NONE SEEN WBC/HPF (ref <=5)
YEAST: NONE SEEN [HPF]

## 2015-04-03 LAB — CYTOLOGY - PAP

## 2015-04-03 LAB — VITAMIN D 25 HYDROXY (VIT D DEFICIENCY, FRACTURES): Vit D, 25-Hydroxy: 25 ng/mL — ABNORMAL LOW (ref 30–100)

## 2015-04-03 MED ORDER — VITAMIN D (ERGOCALCIFEROL) 1.25 MG (50000 UNIT) PO CAPS: 50000.0000 [IU] | ORAL_CAPSULE | ORAL | Status: DC

## 2015-04-05 LAB — URINE CULTURE

## 2015-04-06 ENCOUNTER — Other Ambulatory Visit: Payer: Self-pay | Admitting: Women's Health

## 2015-04-06 MED ORDER — NITROFURANTOIN MONOHYD MACRO 100 MG PO CAPS: 100.0000 mg | ORAL_CAPSULE | Freq: Two times a day (BID) | ORAL | Status: DC

## 2015-04-07 ENCOUNTER — Other Ambulatory Visit: Payer: Self-pay | Admitting: Physician Assistant

## 2015-04-07 ENCOUNTER — Encounter: Payer: Self-pay | Admitting: Women's Health

## 2015-04-07 ENCOUNTER — Telehealth: Payer: Self-pay

## 2015-04-07 NOTE — Telephone Encounter (Signed)
Pt is wanting a call back from [place]

## 2015-04-07 NOTE — Telephone Encounter (Signed)
Spoke with patient, who was seen by optometry.  Patient was under the impression that the doctor felt that her transient vision change was due to possible blood flow lapse, and had the vision change into a wavy pattern that resolved.  She had assumed that she wanted to have a carotid ultrasound.   The note states that Dr. Dara Lords she thought it may be stress related.   We will proceed with the carotid ultrasound.  This has been ordered at this time.

## 2015-04-20 ENCOUNTER — Telehealth: Payer: Self-pay

## 2015-04-20 ENCOUNTER — Ambulatory Visit (AMBULATORY_SURGERY_CENTER): Payer: Self-pay | Admitting: *Deleted

## 2015-04-20 VITALS — Ht 67.0 in | Wt 155.0 lb

## 2015-04-20 DIAGNOSIS — Z1211 Encounter for screening for malignant neoplasm of colon: Secondary | ICD-10-CM

## 2015-04-20 MED ORDER — NA SULFATE-K SULFATE-MG SULF 17.5-3.13-1.6 GM/177ML PO SOLN
1.0000 | Freq: Once | ORAL | Status: DC
Start: 2015-04-20 — End: 2015-05-04

## 2015-04-20 NOTE — Telephone Encounter (Signed)
Advised documentation is in the pick up draw.

## 2015-04-20 NOTE — Telephone Encounter (Signed)
Pt is needing to get documentation that she has received the flu shot here   Best number [phone]

## 2015-04-20 NOTE — Telephone Encounter (Signed)
In pick up.

## 2015-04-20 NOTE — Progress Notes (Signed)
No egg or soy allergy No issues with past sedation No diet pills No home 02 emmi video to e mail  

## 2015-04-21 ENCOUNTER — Telehealth: Payer: Self-pay

## 2015-04-21 NOTE — Telephone Encounter (Signed)
Spoke to pt. Informed pt it is okay to have colonoscopy done before scheduling carotid ultrasound.

## 2015-04-21 NOTE — Telephone Encounter (Signed)
Pt states that stephanie english has scheduled her for a carotid ultrasound and she also has a colonoscopy scheduled is it ok to have the colonoscopy  Best number [phone]

## 2015-05-04 ENCOUNTER — Ambulatory Visit (AMBULATORY_SURGERY_CENTER): Payer: PRIVATE HEALTH INSURANCE | Admitting: Gastroenterology

## 2015-05-04 ENCOUNTER — Encounter: Payer: Self-pay | Admitting: Gastroenterology

## 2015-05-04 VITALS — BP 141/68 | HR 48 | Temp 96.2°F | Resp 15 | Ht 67.0 in | Wt 155.0 lb

## 2015-05-04 DIAGNOSIS — Z1211 Encounter for screening for malignant neoplasm of colon: Secondary | ICD-10-CM | POA: Diagnosis not present

## 2015-05-04 MED ORDER — SODIUM CHLORIDE 0.9 % IV SOLN: 500.0000 mL | INTRAVENOUS | Status: DC

## 2015-05-04 NOTE — Patient Instructions (Addendum)
Small hemorrhoids seen today, handout given.  Appointments made today.   YOU HAD AN ENDOSCOPIC PROCEDURE TODAY AT [facility]:   Refer to the procedure report that was given to you for any specific questions about what was found during the examination.  If the procedure report does not answer your questions, please call your gastroenterologist to clarify.  If you requested that your care partner not be given the details of your procedure findings, then the procedure report has been included in a sealed envelope for you to review at your convenience later.  YOU SHOULD EXPECT: Some feelings of bloating in the abdomen. Passage of more gas than usual.  Walking can help get rid of the air that was put into your GI tract during the procedure and reduce the bloating. If you had a lower endoscopy (such as a colonoscopy or flexible sigmoidoscopy) you may notice spotting of blood in your stool or on the toilet paper. If you underwent a bowel prep for your procedure, you may not have a normal bowel movement for a few days.  Please Note:  You might notice some irritation and congestion in your nose or some drainage.  This is from the oxygen used during your procedure.  There is no need for concern and it should clear up in a day or so.  SYMPTOMS TO REPORT IMMEDIATELY:   Following lower endoscopy (colonoscopy or flexible sigmoidoscopy):  Excessive amounts of blood in the stool  Significant tenderness or worsening of abdominal pains  Swelling of the abdomen that is new, acute  Fever of 100F or higher  For urgent or emergent issues, a gastroenterologist can be reached at any hour by calling [phone].   DIET: Your first meal following the procedure should be a small meal and then it is ok to progress to your normal diet. Heavy or fried foods are harder to digest and may make you feel nauseous or bloated.  Likewise, meals heavy in dairy and vegetables can increase bloating.  Drink plenty  of fluids but you should avoid alcoholic beverages for 24 hours.  ACTIVITY:  You should plan to take it easy for the rest of today and you should NOT DRIVE or use heavy machinery until tomorrow (because of the sedation medicines used during the test).    FOLLOW UP: Our staff will call the number listed on your records the next business day following your procedure to check on you and address any questions or concerns that you may have regarding the information given to you following your procedure. If we do not reach you, we will leave a message.  However, if you are feeling well and you are not experiencing any problems, there is no need to return our call.  We will assume that you have returned to your regular daily activities without incident.  If any biopsies were taken you will be contacted by phone or by letter within the next 1-3 weeks.  Please call us at [phone] if you have not heard about the biopsies in 3 weeks.    SIGNATURES/CONFIDENTIALITY: You and/or your care partner have signed paperwork which will be entered into your electronic medical record.  These signatures attest to the fact that that the information above on your After Visit Summary has been reviewed and is understood.  Full responsibility of the confidentiality of this discharge information lies with you and/or your care-partner.

## 2015-05-04 NOTE — Op Note (Signed)
[place]  [address] [place], [id number]   COLONOSCOPY PROCEDURE REPORT  PATIENT: Denise, [name]  MR#: [id number] BIRTHDATE: 08/15/1950 , 47  yrs. old GENDER: female ENDOSCOPIST: Harl Bowie, MD REFERRED ST:[id number] English, PA PROCEDURE DATE:  05/04/2015 PROCEDURE:   Colonoscopy, screening First Screening Colonoscopy - Avg.  risk and is 50 yrs.  old or older Yes.  Prior Negative Screening - Now for repeat screening. N/A  History of Adenoma - Now for follow-up colonoscopy & has been > or = to 3 yrs.  N/A  Polyps removed today? No Recommend repeat exam, <10 yrs? Yes poor prep ASA CLASS:   Class II INDICATIONS:Screening for colonic neoplasia. MEDICATIONS: Propofol 300 mg IV  DESCRIPTION OF PROCEDURE:   After the risks benefits and alternatives of the procedure were thoroughly explained, informed consent was obtained.  The digital rectal exam revealed no abnormalities of the rectum.   The LB PFC-H190 [id number]  endoscope was introduced through the anus and advanced to the terminal ileum which was intubated for a short distance. No adverse events experienced.   The quality of the prep was suboptimal  The instrument was then slowly withdrawn as the colon was fully examined. Estimated blood loss is zero unless otherwise noted in this procedure report.   COLON FINDINGS: Boston prep score 2-1-2 (5); fair prep with suboptimal visualization of mucosa, 25-30% mucosa was covered with fibrinous material and seeds which couldn't be suctioned.sigmoid diverticulosis.  Small internal hemorrhoids.  Retroflexed views revealed internal hemorrhoids. The time to cecum = 14.0 Withdrawal time = 18.3   The scope was withdrawn and the procedure completed. COMPLICATIONS: There were no immediate complications.  ENDOSCOPIC IMPRESSION: Boston prep score 2-1-2 (5); fair prep with suboptimal visualization of mucosa, 25-30% mucosa was covered with fibrinous material and seeds  which couldn't be suctioned.Sigmoid diverticulosis.  Small internal hemorrhoids  RECOMMENDATIONS: Repeat colonoscopy with extended bowel prep Avoid high fiber diet for 7-10 days prior to the procedure  eSigned:  Harl Bowie, MD 05/04/2015 10:30 AM

## 2015-05-04 NOTE — Progress Notes (Signed)
To recovery, report to Myers, RN, VSS. 

## 2015-05-05 ENCOUNTER — Telehealth: Payer: Self-pay

## 2015-05-05 NOTE — Telephone Encounter (Signed)
  Follow up Call-  Call back number 05/04/2015  Post procedure Call Back phone  # [phone]  Permission to leave phone message Yes     Patient questions:  Do you have a fever, pain , or abdominal swelling? No. Pain Score  0 *  Have you tolerated food without any problems? Yes.    Have you been able to return to your normal activities? Yes.    Do you have any questions about your discharge instructions: Diet   No. Medications  No. Follow up visit  No.  Do you have questions or concerns about your Care? No.  Actions: * If pain score is 4 or above: No action needed, pain <4.

## 2015-07-01 ENCOUNTER — Ambulatory Visit (AMBULATORY_SURGERY_CENTER): Payer: Self-pay

## 2015-07-01 VITALS — Ht 68.0 in | Wt 155.0 lb

## 2015-07-01 DIAGNOSIS — Z1211 Encounter for screening for malignant neoplasm of colon: Secondary | ICD-10-CM

## 2015-07-01 NOTE — Progress Notes (Signed)
No allergies to eggs No diet/weight loss meds No home oxygen No past problems with anesthesia  Has email and internet; refused emmi

## 2015-07-10 ENCOUNTER — Ambulatory Visit (AMBULATORY_SURGERY_CENTER): Payer: PRIVATE HEALTH INSURANCE | Admitting: Gastroenterology

## 2015-07-10 ENCOUNTER — Encounter: Payer: Self-pay | Admitting: Gastroenterology

## 2015-07-10 VITALS — BP 127/71 | HR 53 | Temp 96.4°F | Resp 20 | Ht 68.0 in | Wt 155.0 lb

## 2015-07-10 DIAGNOSIS — Z1211 Encounter for screening for malignant neoplasm of colon: Secondary | ICD-10-CM | POA: Diagnosis present

## 2015-07-10 DIAGNOSIS — K635 Polyp of colon: Secondary | ICD-10-CM | POA: Diagnosis not present

## 2015-07-10 DIAGNOSIS — D124 Benign neoplasm of descending colon: Secondary | ICD-10-CM

## 2015-07-10 DIAGNOSIS — D125 Benign neoplasm of sigmoid colon: Secondary | ICD-10-CM

## 2015-07-10 MED ORDER — SODIUM CHLORIDE 0.9 % IV SOLN: 500.0000 mL | INTRAVENOUS | Status: DC

## 2015-07-10 NOTE — Progress Notes (Signed)
No problems noted in the recovery room. maw 

## 2015-07-10 NOTE — Progress Notes (Signed)
After first set of viatal signs taken in the recovery room, pt requested to urinate.  Pt placed on the bed pan.  Pt voided and bedpan was removed. maw

## 2015-07-10 NOTE — Patient Instructions (Signed)
YOU HAD AN ENDOSCOPIC PROCEDURE TODAY AT THE [facility]:   Refer to the procedure report that was given to you for any specific questions about what was found during the examination.  If the procedure report does not answer your questions, please call your gastroenterologist to clarify.  If you requested that your care partner not be given the details of your procedure findings, then the procedure report has been included in a sealed envelope for you to review at your convenience later.  YOU SHOULD EXPECT: Some feelings of bloating in the abdomen. Passage of more gas than usual.  Walking can help get rid of the air that was put into your GI tract during the procedure and reduce the bloating. If you had a lower endoscopy (such as a colonoscopy or flexible sigmoidoscopy) you may notice spotting of blood in your stool or on the toilet paper. If you underwent a bowel prep for your procedure, you may not have a normal bowel movement for a few days.  Please Note:  You might notice some irritation and congestion in your nose or some drainage.  This is from the oxygen used during your procedure.  There is no need for concern and it should clear up in a day or so.  SYMPTOMS TO REPORT IMMEDIATELY:   Following lower endoscopy (colonoscopy or flexible sigmoidoscopy):  Excessive amounts of blood in the stool  Significant tenderness or worsening of abdominal pains  Swelling of the abdomen that is new, acute  Fever of 100F or higher   For urgent or emergent issues, a gastroenterologist can be reached at any hour by calling [phone].   DIET: Your first meal following the procedure should be a small meal and then it is ok to progress to your normal diet. Heavy or fried foods are harder to digest and may make you feel nauseous or bloated.  Likewise, meals heavy in dairy and vegetables can increase bloating.  Drink plenty of fluids but you should avoid alcoholic beverages for 24  hours.  ACTIVITY:  You should plan to take it easy for the rest of today and you should NOT DRIVE or use heavy machinery until tomorrow (because of the sedation medicines used during the test).    FOLLOW UP: Our staff will call the number listed on your records the next business day following your procedure to check on you and address any questions or concerns that you may have regarding the information given to you following your procedure. If we do not reach you, we will leave a message.  However, if you are feeling well and you are not experiencing any problems, there is no need to return our call.  We will assume that you have returned to your regular daily activities without incident.  If any biopsies were taken you will be contacted by phone or by letter within the next 1-3 weeks.  Please call us at [phone] if you have not heard about the biopsies in 3 weeks.    SIGNATURES/CONFIDENTIALITY: You and/or your care partner have signed paperwork which will be entered into your electronic medical record.  These signatures attest to the fact that that the information above on your After Visit Summary has been reviewed and is understood.  Full responsibility of the confidentiality of this discharge information lies with you and/or your care-partner.    Handouts were given to your care partner on polyps, diverticulosis, hemorrhoids, and a high fiber diet with liberal fluid intake.  You   may resume your current medications today. Await biopsy results. Please call if any questions or concerns.   

## 2015-07-10 NOTE — Progress Notes (Signed)
Called to room to assist during endoscopic procedure.  Patient ID and intended procedure confirmed with present staff. Received instructions for my participation in the procedure from the performing physician.  

## 2015-07-10 NOTE — Progress Notes (Signed)
Report to PACU, RN, vss, BBS= Clear.  

## 2015-07-10 NOTE — Op Note (Signed)
[place]  [address]. [place], [address]   COLONOSCOPY PROCEDURE REPORT  PATIENT: Denise [name], Denise [name]  MR#: [id number] BIRTHDATE: 12-02-1950 , 57  yrs. old GENDER: female ENDOSCOPIST: Harl Bowie, MD REFERRED [id number] [place], NP PROCEDURE DATE:  07/10/2015 PROCEDURE:   Colonoscopy, screening and Colonoscopy with cold biopsy polypectomy First Screening Colonoscopy - Avg.  risk and is 50 yrs.  old or older - No.  Prior Negative Screening - Now for repeat screening. Less than 10 yrs Prior Negative Screening - Now for repeat screening.  Inadequate prep  History of Adenoma - Now for follow-up colonoscopy & has been > or = to 3 yrs.  N/A  Polyps removed today? Yes ASA CLASS:   Class II INDICATIONS:Screening for colonic neoplasia and Colorectal Neoplasm Risk Assessment for this procedure is average risk. MEDICATIONS: Propofol 200 mg IV  DESCRIPTION OF PROCEDURE:   After the risks benefits and alternatives of the procedure were thoroughly explained, informed consent was obtained.  The digital rectal exam revealed no abnormalities of the rectum.   The LB PFC-H190 [id number]  endoscope was introduced through the anus and advanced to the cecum, which was identified by both the appendix and ileocecal valve. No adverse events experienced.   The quality of the prep was good.  The instrument was then slowly withdrawn as the colon was fully examined. Estimated blood loss is zero unless otherwise noted in this procedure report.   COLON FINDINGS: Two sessile polyps ranging between 3-8mm in size were found in the sigmoid colon and descending colon. Polypectomies were performed with cold forceps.  The resection was complete, the polyp tissue was completely retrieved and sent to histology.   There was mild diverticulosis noted in the sigmoid colon, descending colon, and ascending colon.  Retroflexed views revealed internal hemorrhoids. The time to cecum = 6.4  Withdrawal time = 13.9   The scope was withdrawn and the procedure completed. COMPLICATIONS: There were no immediate complications.  ENDOSCOPIC IMPRESSION: 1.   Two sessile polyps ranging between 3-34mm in size were found in the sigmoid colon and descending colon; polypectomies were performed with cold forceps 2.   Mild diverticulosis was noted in the sigmoid colon, descending colon, and ascending colon  RECOMMENDATIONS: 1.  If the polyp(s) removed today are proven to be adenomatous (pre-cancerous) polyps, you will need a repeat colonoscopy in 5 years.  Otherwise you should continue to follow colorectal cancer screening guidelines for "routine risk" patients with colonoscopy in 10 years.  You will receive a letter within 1-2 weeks with the results of your biopsy as well as final recommendations.  Please call my office if you have not received a letter after 3 weeks. 2.  Await pathology results  eSigned:  Harl Bowie, MD 07/10/2015 4:12 PM

## 2015-07-14 ENCOUNTER — Telehealth: Payer: Self-pay

## 2015-07-14 NOTE — Telephone Encounter (Signed)
  Follow up Call-  Call back number 07/10/2015 05/04/2015  Post procedure Call Back phone  # [phone]  Permission to leave phone message Yes Yes     Patient questions:  Do you have a fever, pain , or abdominal swelling? No. Pain Score  0 *  Have you tolerated food without any problems? Yes.    Have you been able to return to your normal activities? Yes.    Do you have any questions about your discharge instructions: Diet   No. Medications  No. Follow up visit  No.  Do you have questions or concerns about your Care? No.  Actions: * If pain score is 4 or above: No action needed, pain <4.

## 2015-07-27 ENCOUNTER — Encounter: Payer: Self-pay | Admitting: Gastroenterology

## 2015-08-21 ENCOUNTER — Encounter: Payer: Self-pay | Admitting: Physician Assistant

## 2015-11-12 ENCOUNTER — Ambulatory Visit (INDEPENDENT_AMBULATORY_CARE_PROVIDER_SITE_OTHER): Payer: PRIVATE HEALTH INSURANCE | Admitting: Gynecology

## 2015-11-12 ENCOUNTER — Encounter: Payer: Self-pay | Admitting: Gynecology

## 2015-11-12 VITALS — BP 118/76

## 2015-11-12 DIAGNOSIS — N814 Uterovaginal prolapse, unspecified: Secondary | ICD-10-CM

## 2015-11-12 DIAGNOSIS — N816 Rectocele: Secondary | ICD-10-CM | POA: Diagnosis not present

## 2015-11-12 DIAGNOSIS — R8271 Bacteriuria: Secondary | ICD-10-CM | POA: Diagnosis not present

## 2015-11-12 DIAGNOSIS — N952 Postmenopausal atrophic vaginitis: Secondary | ICD-10-CM | POA: Diagnosis not present

## 2015-11-12 DIAGNOSIS — N8111 Cystocele, midline: Secondary | ICD-10-CM | POA: Diagnosis not present

## 2015-11-12 LAB — URINALYSIS W MICROSCOPIC + REFLEX CULTURE
BILIRUBIN URINE: NEGATIVE
CRYSTALS: NONE SEEN [HPF]
Casts: NONE SEEN [LPF]
GLUCOSE, UA: NEGATIVE
Hgb urine dipstick: NEGATIVE
Ketones, ur: NEGATIVE
LEUKOCYTES UA: NEGATIVE
Nitrite: NEGATIVE
PH: 7 (ref 5.0–8.0)
PROTEIN: NEGATIVE
RBC / HPF: NONE SEEN RBC/HPF (ref <=2)
Specific Gravity, Urine: 1.015 (ref 1.001–1.035)
Yeast: NONE SEEN [HPF]

## 2015-11-12 MED ORDER — SULFAMETHOXAZOLE-TRIMETHOPRIM 800-160 MG PO TABS: 1.0000 | ORAL_TABLET | Freq: Two times a day (BID) | ORAL | Status: DC

## 2015-11-12 NOTE — Addendum Note (Signed)
Addended by: Nelva Nay on: 11/12/2015 12:42 PM   Modules accepted: Orders

## 2015-11-12 NOTE — Patient Instructions (Signed)
Take the antibiotic twice daily for 3 days.  Call if you want to pursue either a trial of pessary or want to proceed with surgery.

## 2015-11-12 NOTE — Progress Notes (Addendum)
    Denise [name] 05/21/1951 [id number]        64 y.o.  [id number] presents with a several week history of pelvic pressure particularly with urination. No frequency, dysuria, urgency or low back pain. No fever or chills. No persistent constipation or diarrhea. No history of same before.  Past medical history,surgical history, problem list, medications, allergies, family history and social history were all reviewed and documented in the EPIC chart.  Directed ROS with pertinent positives and negatives documented in the history of present illness/assessment and plan.  Exam: Caryn Bee assistant Filed Vitals:   11/12/15 1028  BP: 118/76   General appearance:  Normal Spine straight without CVA tenderness Abdomen soft nontender without masses guarding rebound Pelvic external BUS vagina with atrophic changes and first degree cystocele, first-degree uterine prolapse and first-degree rectocele. Cervix grossly normal. Uterus retroverted grossly normal size midline mobile nontender. Adnexa without masses or tenderness.  Assessment/Plan:  65 y.o. [id number] with pelvic relaxation as evidenced by cystocele/rectocele and uterine prolapse. Urinalysis shows 0-5 WBC with few bacteria. Will cover with Septra DS 1 by mouth twice a day 3 days to see if this is not contributing to her symptoms. I reviewed in detail to include pictures as far as what cystocele, rectocele and uterine prolapse are.  Options for management include observation, trial of pessary and surgery also reviewed. Which involved with pessary and surgery discussed. The pros/cons of each choice reviewed.  She is not having any issues as far as stool trapping, urinary incontinence or feeling something protruding from the vagina.  Patient's going to monitor her symptoms for now to see if the antibiotics help at all as well as how bothersome these symptoms are and if they continue to be an issue and she wants to pursue pessary or surgery which would  include TVH BSO A&P repair she will call and we will make arrangements for this.  Greater than 50% of my time was spent in direct face to face counseling and coordination of care with the patient.    Anastasio Auerbach MD, 10:51 AM 11/12/2015

## 2015-11-14 LAB — URINE CULTURE
COLONY COUNT: NO GROWTH
Organism ID, Bacteria: NO GROWTH

## 2016-04-05 ENCOUNTER — Encounter: Payer: Self-pay | Admitting: Women's Health

## 2016-04-05 ENCOUNTER — Ambulatory Visit (INDEPENDENT_AMBULATORY_CARE_PROVIDER_SITE_OTHER): Payer: PRIVATE HEALTH INSURANCE | Admitting: Women's Health

## 2016-04-05 VITALS — BP 112/74 | Ht 66.0 in | Wt 150.6 lb

## 2016-04-05 DIAGNOSIS — Z13220 Encounter for screening for lipoid disorders: Secondary | ICD-10-CM | POA: Diagnosis not present

## 2016-04-05 DIAGNOSIS — M8580 Other specified disorders of bone density and structure, unspecified site: Secondary | ICD-10-CM | POA: Diagnosis not present

## 2016-04-05 DIAGNOSIS — Z13820 Encounter for screening for osteoporosis: Secondary | ICD-10-CM | POA: Diagnosis not present

## 2016-04-05 DIAGNOSIS — Z01419 Encounter for gynecological examination (general) (routine) without abnormal findings: Secondary | ICD-10-CM | POA: Diagnosis not present

## 2016-04-05 LAB — CBC WITH DIFFERENTIAL/PLATELET
BASOS ABS: 39 {cells}/uL (ref 0–200)
BASOS PCT: 1 %
EOS ABS: 39 {cells}/uL (ref 15–500)
Eosinophils Relative: 1 %
HCT: 40.3 % (ref 35.0–45.0)
Hemoglobin: 13.6 g/dL (ref 11.7–15.5)
LYMPHS ABS: 1638 {cells}/uL (ref 850–3900)
Lymphocytes Relative: 42 %
MCH: 28.2 pg (ref 27.0–33.0)
MCHC: 33.7 g/dL (ref 32.0–36.0)
MCV: 83.6 fL (ref 80.0–100.0)
MONO ABS: 234 {cells}/uL (ref 200–950)
MPV: 9.8 fL (ref 7.5–12.5)
Monocytes Relative: 6 %
NEUTROS ABS: 1950 {cells}/uL (ref 1500–7800)
NEUTROS PCT: 50 %
PLATELETS: 280 10*3/uL (ref 140–400)
RBC: 4.82 MIL/uL (ref 3.80–5.10)
RDW: 13.6 % (ref 11.0–15.0)
WBC: 3.9 10*3/uL (ref 3.8–10.8)

## 2016-04-05 NOTE — Progress Notes (Signed)
Denise [name] [birth year] [id number]    History:    Presents for annual exam. Postmenopausal/no HRT/no bleeding. Normal Pap and mammogram history, had normal ultrasound follow-up after last mammogram in 2016. 2014 T score -1.2, hip average 0.9 FRAX 6.6%/0.3%. Glaucoma followed by ophthalmologist. 07/2015 2 benign colon polyps 10 year follow-up. Has not received Zostavax. Same partner greater than 5 years.  Past medical history, past surgical history, family history and social history were all reviewed and documented in the EPIC chart. [employer]. 3 children 1 lives in [place], 1 lives in [place] daughter lives here. 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  ROS:  A ROS was performed and pertinent positives and negatives are included.  Exam:  Vitals:   04/05/16 0830  BP: 112/74  Weight: 150 lb 9.6 oz (68.3 kg)  Height: 5\' 6"  (1.676 m)   Body mass index is 24.31 kg/m.   General appearance:  Normal Thyroid:  Symmetrical, normal in size, without palpable masses or nodularity. Respiratory  Auscultation:  Clear without wheezing or rhonchi Cardiovascular  Auscultation:  Regular rate, without rubs, murmurs or gallops  Edema/varicosities:  Not grossly evident Abdominal  Soft,nontender, without masses, guarding or rebound.  Liver/spleen:  No organomegaly noted  Hernia:  None appreciated  Skin  Inspection:  Grossly normal   Breasts: Examined lying and sitting.     Right: Without masses, retractions, discharge or axillary adenopathy.     Left: Without masses, retractions, discharge or axillary adenopathy. Gentitourinary   Inguinal/mons:  Normal without inguinal adenopathy  External genitalia:  Normal  BUS/Urethra/Skene's glands:  Normal  Vagina:  Normal  Cervix:  Normal  Uterus:  normal in size, shape and contour.  Midline and mobile  Adnexa/parametria:     Rt: Without masses or tenderness.   Lt: Without masses or tenderness.  Anus and perineum: Normal  Digital rectal  exam: Normal sphincter tone without palpated masses or tenderness  Assessment/Plan:  65 y.o. WBF G 6 P3 for annual exam with no complaints.  Postmenopausal/no HRT/no bleeding Glaucoma-ophthalmologist manages Osteopenia without elevated FRAX  Plan: Zostavax recommended, SBE's, continue annual screening mammogram 3-D tomography reviewed and encouraged. Exercise, calcium rich diet, vitamin D 2000 daily encouraged. Home safety, fall prevention and importance of weightbearing exercise reviewed. Repeat DEXA, instructed to schedule. CBC, lipid panel, CMP, vitamin D, UA, Pap normal with negative HR HPV new screening guidelines reviewed.  [place], 9:18 AM 04/05/2016

## 2016-04-05 NOTE — Patient Instructions (Signed)

## 2016-04-06 ENCOUNTER — Other Ambulatory Visit: Payer: Self-pay | Admitting: *Deleted

## 2016-04-06 LAB — LIPID PANEL
CHOL/HDL RATIO: 3.7 ratio (ref <=5.0)
Cholesterol: 173 mg/dL (ref 125–200)
HDL: 47 mg/dL (ref >=46)
LDL Cholesterol: 113 mg/dL (ref <130)
Triglycerides: 64 mg/dL (ref <150)
VLDL: 13 mg/dL (ref <30)

## 2016-04-06 LAB — COMPREHENSIVE METABOLIC PANEL
ALT: 10 U/L (ref 6–29)
AST: 16 U/L (ref 10–35)
Albumin: 4.3 g/dL (ref 3.6–5.1)
Alkaline Phosphatase: 80 U/L (ref 33–130)
BUN: 9 mg/dL (ref 7–25)
CO2: 25 mmol/L (ref 20–31)
Calcium: 9.1 mg/dL (ref 8.6–10.4)
Chloride: 111 mmol/L — ABNORMAL HIGH (ref 98–110)
Creat: 1.06 mg/dL — ABNORMAL HIGH (ref 0.50–0.99)
GLUCOSE: 91 mg/dL (ref 65–99)
POTASSIUM: 4 mmol/L (ref 3.5–5.3)
Sodium: 144 mmol/L (ref 135–146)
Total Bilirubin: 0.6 mg/dL (ref 0.2–1.2)
Total Protein: 6.9 g/dL (ref 6.1–8.1)

## 2016-04-06 LAB — URINALYSIS W MICROSCOPIC + REFLEX CULTURE
BILIRUBIN URINE: NEGATIVE
Bacteria, UA: NONE SEEN [HPF]
CASTS: NONE SEEN [LPF]
CRYSTALS: NONE SEEN [HPF]
Glucose, UA: NEGATIVE
Hgb urine dipstick: NEGATIVE
Ketones, ur: NEGATIVE
Leukocytes, UA: NEGATIVE
Nitrite: NEGATIVE
Protein, ur: NEGATIVE
RBC / HPF: NONE SEEN RBC/HPF (ref <=2)
SPECIFIC GRAVITY, URINE: 1.008 (ref 1.001–1.035)
Squamous Epithelial / LPF: NONE SEEN [HPF] (ref <=5)
WBC UA: NONE SEEN WBC/HPF (ref <=5)
YEAST: NONE SEEN [HPF]
pH: 5.5 (ref 5.0–8.0)

## 2016-04-06 LAB — VITAMIN D 25 HYDROXY (VIT D DEFICIENCY, FRACTURES): Vit D, 25-Hydroxy: 24 ng/mL — ABNORMAL LOW (ref 30–100)

## 2016-04-06 MED ORDER — VITAMIN D (ERGOCALCIFEROL) 1.25 MG (50000 UNIT) PO CAPS: ORAL_CAPSULE | ORAL | 0 refills | Status: DC

## 2016-04-07 ENCOUNTER — Encounter: Payer: Self-pay | Admitting: Women's Health

## 2016-04-26 ENCOUNTER — Other Ambulatory Visit: Payer: Self-pay | Admitting: Gynecology

## 2016-04-26 DIAGNOSIS — Z13820 Encounter for screening for osteoporosis: Secondary | ICD-10-CM

## 2016-04-26 DIAGNOSIS — M8580 Other specified disorders of bone density and structure, unspecified site: Secondary | ICD-10-CM

## 2016-05-07 ENCOUNTER — Encounter: Payer: Self-pay | Admitting: Women's Health

## 2016-05-10 ENCOUNTER — Encounter: Payer: Self-pay | Admitting: Physician Assistant

## 2016-05-10 ENCOUNTER — Encounter: Payer: Self-pay | Admitting: Gynecology

## 2016-05-10 ENCOUNTER — Ambulatory Visit (INDEPENDENT_AMBULATORY_CARE_PROVIDER_SITE_OTHER): Payer: PRIVATE HEALTH INSURANCE

## 2016-05-10 ENCOUNTER — Telehealth: Payer: Self-pay | Admitting: Gynecology

## 2016-05-10 DIAGNOSIS — Z13820 Encounter for screening for osteoporosis: Secondary | ICD-10-CM | POA: Diagnosis not present

## 2016-05-10 DIAGNOSIS — H93293 Other abnormal auditory perceptions, bilateral: Secondary | ICD-10-CM | POA: Insufficient documentation

## 2016-05-10 DIAGNOSIS — M8580 Other specified disorders of bone density and structure, unspecified site: Secondary | ICD-10-CM

## 2016-05-10 DIAGNOSIS — H47239 Glaucomatous optic atrophy, unspecified eye: Secondary | ICD-10-CM | POA: Insufficient documentation

## 2016-05-10 DIAGNOSIS — I7090 Unspecified atherosclerosis: Secondary | ICD-10-CM | POA: Insufficient documentation

## 2016-05-10 NOTE — Telephone Encounter (Signed)
Tell patient her bone density does show some loss at the spine. Not to the point that I would recommend treatment with medication at this point. I would make sure she is taking her extra vitamin D and recheck her vitamin D level in several months when she is done with the prescription. Plan on repeating the bone density in 2 years

## 2016-05-10 NOTE — Telephone Encounter (Signed)
Pt informed with the below note, pt understood she will need to come back to have vitamin d level rechecked.

## 2016-06-04 ENCOUNTER — Encounter: Payer: Self-pay | Admitting: Women's Health

## 2016-07-02 ENCOUNTER — Other Ambulatory Visit: Payer: Self-pay | Admitting: Women's Health

## 2017-02-03 ENCOUNTER — Encounter: Payer: Self-pay | Admitting: Physician Assistant

## 2017-04-06 ENCOUNTER — Encounter: Payer: PRIVATE HEALTH INSURANCE | Admitting: Women's Health

## 2017-04-10 ENCOUNTER — Encounter: Payer: PRIVATE HEALTH INSURANCE | Admitting: Women's Health

## 2017-05-01 ENCOUNTER — Encounter: Payer: Self-pay | Admitting: Women's Health

## 2017-05-01 ENCOUNTER — Ambulatory Visit (INDEPENDENT_AMBULATORY_CARE_PROVIDER_SITE_OTHER): Payer: PRIVATE HEALTH INSURANCE | Admitting: Women's Health

## 2017-05-01 VITALS — BP 122/80 | Ht 66.0 in | Wt 141.0 lb

## 2017-05-01 DIAGNOSIS — E559 Vitamin D deficiency, unspecified: Secondary | ICD-10-CM

## 2017-05-01 DIAGNOSIS — Z13220 Encounter for screening for lipoid disorders: Secondary | ICD-10-CM

## 2017-05-01 DIAGNOSIS — Z23 Encounter for immunization: Secondary | ICD-10-CM | POA: Diagnosis not present

## 2017-05-01 DIAGNOSIS — Z01419 Encounter for gynecological examination (general) (routine) without abnormal findings: Secondary | ICD-10-CM | POA: Diagnosis not present

## 2017-05-01 NOTE — Patient Instructions (Signed)
shingrex  Vaccine  2 series 6 mo apart  Health Maintenance for Postmenopausal Women Menopause is a normal process in which your reproductive ability comes to an end. This process happens gradually over a span of months to years, usually between the ages of 36 and 9. Menopause is complete when you have missed 12 consecutive menstrual periods. It is important to talk with your health care provider about some of the most common conditions that affect postmenopausal women, such as heart disease, cancer, and bone loss (osteoporosis). Adopting a healthy lifestyle and getting preventive care can help to promote your health and wellness. Those actions can also lower your chances of developing some of these common conditions. What should I know about menopause? During menopause, you may experience a number of symptoms, such as:  Moderate-to-severe hot flashes.  Night sweats.  Decrease in sex drive.  Mood swings.  Headaches.  Tiredness.  Irritability.  Memory problems.  Insomnia.  Choosing to treat or not to treat menopausal changes is an individual decision that you make with your health care provider. What should I know about hormone replacement therapy and supplements? Hormone therapy products are effective for treating symptoms that are associated with menopause, such as hot flashes and night sweats. Hormone replacement carries certain risks, especially as you become older. If you are thinking about using estrogen or estrogen with progestin treatments, discuss the benefits and risks with your health care provider. What should I know about heart disease and stroke? Heart disease, heart attack, and stroke become more likely as you age. This may be due, in part, to the hormonal changes that your body experiences during menopause. These can affect how your body processes dietary fats, triglycerides, and cholesterol. Heart attack and stroke are both medical emergencies. There are many things  that you can do to help prevent heart disease and stroke:  Have your blood pressure checked at least every 1-2 years. High blood pressure causes heart disease and increases the risk of stroke.  If you are 34-20 years old, ask your health care provider if you should take aspirin to prevent a heart attack or a stroke.  Do not use any tobacco products, including cigarettes, chewing tobacco, or electronic cigarettes. If you need help quitting, ask your health care provider.  It is important to eat a healthy diet and maintain a healthy weight. ? Be sure to include plenty of vegetables, fruits, low-fat dairy products, and lean protein. ? Avoid eating foods that are high in solid fats, added sugars, or salt (sodium).  Get regular exercise. This is one of the most important things that you can do for your health. ? Try to exercise for at least 150 minutes each week. The type of exercise that you do should increase your heart rate and make you sweat. This is known as moderate-intensity exercise. ? Try to do strengthening exercises at least twice each week. Do these in addition to the moderate-intensity exercise.  Know your numbers.Ask your health care provider to check your cholesterol and your blood glucose. Continue to have your blood tested as directed by your health care provider.  What should I know about cancer screening? There are several types of cancer. Take the following steps to reduce your risk and to catch any cancer development as early as possible. Breast Cancer  Practice breast self-awareness. ? This means understanding how your breasts normally appear and feel. ? It also means doing regular breast self-exams. Let your health care provider know about any  changes, no matter how small.  If you are 63 or older, have a clinician do a breast exam (clinical breast exam or CBE) every year. Depending on your age, family history, and medical history, it may be recommended that you also have  a yearly breast X-ray (mammogram).  If you have a family history of breast cancer, talk with your health care provider about genetic screening.  If you are at high risk for breast cancer, talk with your health care provider about having an MRI and a mammogram every year.  Breast cancer (BRCA) gene test is recommended for women who have family members with BRCA-related cancers. Results of the assessment will determine the need for genetic counseling and BRCA1 and for BRCA2 testing. BRCA-related cancers include these types: ? Breast. This occurs in males or females. ? Ovarian. ? Tubal. This may also be called fallopian tube cancer. ? Cancer of the abdominal or pelvic lining (peritoneal cancer). ? Prostate. ? Pancreatic.  Cervical, Uterine, and Ovarian Cancer Your health care provider may recommend that you be screened regularly for cancer of the pelvic organs. These include your ovaries, uterus, and vagina. This screening involves a pelvic exam, which includes checking for microscopic changes to the surface of your cervix (Pap test).  For women ages 21-65, health care providers may recommend a pelvic exam and a Pap test every three years. For women ages 30-65, they may recommend the Pap test and pelvic exam, combined with testing for human papilloma virus (HPV), every five years. Some types of HPV increase your risk of cervical cancer. Testing for HPV may also be done on women of any age who have unclear Pap test results.  Other health care providers may not recommend any screening for nonpregnant women who are considered low risk for pelvic cancer and have no symptoms. Ask your health care provider if a screening pelvic exam is right for you.  If you have had past treatment for cervical cancer or a condition that could lead to cancer, you need Pap tests and screening for cancer for at least 20 years after your treatment. If Pap tests have been discontinued for you, your risk factors (such as  having a new sexual partner) need to be reassessed to determine if you should start having screenings again. Some women have medical problems that increase the chance of getting cervical cancer. In these cases, your health care provider may recommend that you have screening and Pap tests more often.  If you have a family history of uterine cancer or ovarian cancer, talk with your health care provider about genetic screening.  If you have vaginal bleeding after reaching menopause, tell your health care provider.  There are currently no reliable tests available to screen for ovarian cancer.  Lung Cancer Lung cancer screening is recommended for adults 23-43 years old who are at high risk for lung cancer because of a history of smoking. A yearly low-dose CT scan of the lungs is recommended if you:  Currently smoke.  Have a history of at least 30 pack-years of smoking and you currently smoke or have quit within the past 15 years. A pack-year is smoking an average of one pack of cigarettes per day for one year.  Yearly screening should:  Continue until it has been 15 years since you quit.  Stop if you develop a health problem that would prevent you from having lung cancer treatment.  Colorectal Cancer  This type of cancer can be detected and can often  be prevented.  Routine colorectal cancer screening usually begins at age 55 and continues through age 23.  If you have risk factors for colon cancer, your health care provider may recommend that you be screened at an earlier age.  If you have a family history of colorectal cancer, talk with your health care provider about genetic screening.  Your health care provider may also recommend using home test kits to check for hidden blood in your stool.  A small camera at the end of a tube can be used to examine your colon directly (sigmoidoscopy or colonoscopy). This is done to check for the earliest forms of colorectal cancer.  Direct  examination of the colon should be repeated every 5-10 years until age 82. However, if early forms of precancerous polyps or small growths are found or if you have a family history or genetic risk for colorectal cancer, you may need to be screened more often.  Skin Cancer  Check your skin from head to toe regularly.  Monitor any moles. Be sure to tell your health care provider: ? About any new moles or changes in moles, especially if there is a change in a mole's shape or color. ? If you have a mole that is larger than the size of a pencil eraser.  If any of your family members has a history of skin cancer, especially at a young age, talk with your health care provider about genetic screening.  Always use sunscreen. Apply sunscreen liberally and repeatedly throughout the day.  Whenever you are outside, protect yourself by wearing long sleeves, pants, a wide-brimmed hat, and sunglasses.  What should I know about osteoporosis? Osteoporosis is a condition in which bone destruction happens more quickly than new bone creation. After menopause, you may be at an increased risk for osteoporosis. To help prevent osteoporosis or the bone fractures that can happen because of osteoporosis, the following is recommended:  If you are 53-79 years old, get at least 1,000 mg of calcium and at least 600 mg of vitamin D per day.  If you are older than age 60 but younger than age 20, get at least 1,200 mg of calcium and at least 600 mg of vitamin D per day.  If you are older than age 25, get at least 1,200 mg of calcium and at least 800 mg of vitamin D per day.  Smoking and excessive alcohol intake increase the risk of osteoporosis. Eat foods that are rich in calcium and vitamin D, and do weight-bearing exercises several times each week as directed by your health care provider. What should I know about how menopause affects my mental health? Depression may occur at any age, but it is more common as you become  older. Common symptoms of depression include:  Low or sad mood.  Changes in sleep patterns.  Changes in appetite or eating patterns.  Feeling an overall lack of motivation or enjoyment of activities that you previously enjoyed.  Frequent crying spells.  Talk with your health care provider if you think that you are experiencing depression. What should I know about immunizations? It is important that you get and maintain your immunizations. These include:  Tetanus, diphtheria, and pertussis (Tdap) booster vaccine.  Influenza every year before the flu season begins.  Pneumonia vaccine.  Shingles vaccine.  Your health care provider may also recommend other immunizations. This information is not intended to replace advice given to you by your health care provider. Make sure you discuss any questions  you have with your health care provider. Document Released: 08/12/2005 Document Revised: 01/08/2016 Document Reviewed: 03/24/2015 Elsevier Interactive Patient Education  2018 [employer].

## 2017-05-01 NOTE — Progress Notes (Signed)
Denise [name] May 07, 1951 [id number]    History: 66 year old SBF G6P3 presents for annual exam without complaints. Postmenopausal/no HRT/no bleeding. Normal pap and mammogram history, mammogram . Bone density 05/2016 - spine -1.6 normal at hip FRAX 6.5%/0.2%. Taking Vitamin D and calcium supplements. Colonoscopy 07/2015, benign polyps, with 10 year follow up. Received influenza vaccine. Hasn't received Shingrex or Tdap. Same partner great than 5 years. Followed by ophthalmologist for glaucoma. Has decreased hearing, not progressing. Has 9 lb intentional weight loss from last annual visit on 04/2016 due to improved diet and has fitbit, averaging 7000 steps/day.   Past medical history, past surgical history, family history and social history were all reviewed and documented in the EPIC chart. [employer]. 3 children in [place], [place], and here. 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  ROS:  A ROS was performed and pertinent positives and negatives are included.  Exam:  Vitals:   05/01/17 0838  BP: 122/80  Weight: 141 lb (64 kg)  Height: 5\' 6"  (1.676 m)   Body mass index is 22.76 kg/m.   General appearance:  Normal Thyroid:  Symmetrical, normal in size, without palpable masses or nodularity. Respiratory  Auscultation:  Clear without wheezing or rhonchi Cardiovascular  Auscultation:  Regular rate, without rubs, murmurs or gallops  Edema/varicosities:  Not grossly evident Abdominal  Soft,nontender, without masses, guarding or rebound.  Liver/spleen:  No organomegaly noted  Hernia:  None appreciated  Skin  Inspection:  Grossly normal   Breasts: Examined lying and sitting.     Right: Without masses, retractions, discharge or axillary adenopathy.     Left: Without masses, retractions, discharge or axillary adenopathy. Gentitourinary   Inguinal/mons:  Normal without inguinal adenopathy  External genitalia:  Mild atrophy and dryness   BUS/Urethra/Skene's glands:   Normal  Vagina:  Normal  Cervix:  Normal  Uterus:  normal in size, shape and contour.  Midline and mobile  Adnexa/parametria:     Rt: Without masses or tenderness.   Lt: Without masses or tenderness.  Anus and perineum: Normal/rectal exam negative   Assessment/Plan:  66 y.o.  SBF G9P3 presents for annual exam without complaints.  Postmenopasual/no HRT/without bleeding Osteopenia without elevated FRAX Glaucoma - followed by ophthalmologist Tdap  Mild CKD - elevated baseline creatinine 1.06, stable  Plan: Shingrex recommended, will establish a PCP and receive immunization there. Administered Tdap today. Encouraged SBEs, continue annual screening mammogram, annual eye exams, Vitamin D 2000 U daily, calcium rich/low protein diet, exercise, fall prevention, home and personal safety. Reviewed bone density results from 2017. Pap normal 2016, reviewed new screening guidelines. CBC, UA with culture, Vitamin D, lipid panel, CMP.    [place], 9:14 AM 05/01/2017

## 2017-05-02 ENCOUNTER — Other Ambulatory Visit: Payer: Self-pay | Admitting: Women's Health

## 2017-05-02 LAB — COMPREHENSIVE METABOLIC PANEL
AG Ratio: 1.8 (calc) (ref 1.0–2.5)
ALT: 8 U/L (ref 6–29)
AST: 13 U/L (ref 10–35)
Albumin: 4.1 g/dL (ref 3.6–5.1)
Alkaline phosphatase (APISO): 68 U/L (ref 33–130)
BILIRUBIN TOTAL: 0.5 mg/dL (ref 0.2–1.2)
BUN: 14 mg/dL (ref 7–25)
CALCIUM: 9.2 mg/dL (ref 8.6–10.4)
CO2: 27 mmol/L (ref 20–32)
CREATININE: 0.97 mg/dL (ref 0.50–0.99)
Chloride: 110 mmol/L (ref 98–110)
GLUCOSE: 102 mg/dL — AB (ref 65–99)
Globulin: 2.3 g/dL (calc) (ref 1.9–3.7)
POTASSIUM: 4.5 mmol/L (ref 3.5–5.3)
Sodium: 143 mmol/L (ref 135–146)
Total Protein: 6.4 g/dL (ref 6.1–8.1)

## 2017-05-02 LAB — CBC WITH DIFFERENTIAL/PLATELET
BASOS ABS: 50 {cells}/uL (ref 0–200)
Basophils Relative: 1 %
EOS ABS: 140 {cells}/uL (ref 15–500)
Eosinophils Relative: 2.8 %
HCT: 39.5 % (ref 35.0–45.0)
HEMOGLOBIN: 12.9 g/dL (ref 11.7–15.5)
Lymphs Abs: 1835 cells/uL (ref 850–3900)
MCH: 27.5 pg (ref 27.0–33.0)
MCHC: 32.7 g/dL (ref 32.0–36.0)
MCV: 84.2 fL (ref 80.0–100.0)
MONOS PCT: 7.4 %
MPV: 10.7 fL (ref 7.5–12.5)
NEUTROS ABS: 2605 {cells}/uL (ref 1500–7800)
Neutrophils Relative %: 52.1 %
Platelets: 299 10*3/uL (ref 140–400)
RBC: 4.69 10*6/uL (ref 3.80–5.10)
RDW: 12.8 % (ref 11.0–15.0)
Total Lymphocyte: 36.7 %
WBC: 5 10*3/uL (ref 3.8–10.8)
WBCMIX: 370 {cells}/uL (ref 200–950)

## 2017-05-02 LAB — LIPID PANEL
CHOL/HDL RATIO: 3.2 (calc) (ref <5.0)
Cholesterol: 172 mg/dL (ref <200)
HDL: 53 mg/dL (ref >50)
LDL CHOLESTEROL (CALC): 103 mg/dL — AB
NON-HDL CHOLESTEROL (CALC): 119 mg/dL (ref <130)
TRIGLYCERIDES: 75 mg/dL (ref <150)

## 2017-05-02 LAB — VITAMIN D 25 HYDROXY (VIT D DEFICIENCY, FRACTURES): VIT D 25 HYDROXY: 22 ng/mL — AB (ref 30–100)

## 2017-05-02 MED ORDER — VITAMIN D (ERGOCALCIFEROL) 1.25 MG (50000 UNIT) PO CAPS: 50000.0000 [IU] | ORAL_CAPSULE | ORAL | 0 refills | Status: DC

## 2017-05-03 LAB — URINALYSIS W MICROSCOPIC + REFLEX CULTURE
BACTERIA UA: NONE SEEN /HPF
Bilirubin Urine: NEGATIVE
Glucose, UA: NEGATIVE
Hgb urine dipstick: NEGATIVE
Hyaline Cast: NONE SEEN /LPF
KETONES UR: NEGATIVE
Nitrites, Initial: NEGATIVE
Protein, ur: NEGATIVE
SPECIFIC GRAVITY, URINE: 1.019 (ref 1.001–1.03)
Squamous Epithelial / LPF: NONE SEEN /HPF (ref <=5)

## 2017-05-03 LAB — URINE CULTURE
MICRO NUMBER: 81214854
RESULT: NO GROWTH
SPECIMEN QUALITY: ADEQUATE

## 2017-05-03 LAB — CULTURE INDICATED

## 2017-05-04 ENCOUNTER — Encounter: Payer: PRIVATE HEALTH INSURANCE | Admitting: Physician Assistant

## 2017-07-22 ENCOUNTER — Telehealth: Payer: Self-pay | Admitting: Physician Assistant

## 2017-07-22 NOTE — Telephone Encounter (Signed)
**  TRIED TO CALL PT TO RESCHEDULE--NO VOICEMAIL SET UP COULD NOT LEAVE MESSAGE--PT NEEDS TO BE RESCHDULE TO A DIFFERENT DAY OR A DIFFERENT PROVIDER**CC

## 2017-07-24 ENCOUNTER — Inpatient Hospital Stay: Payer: PRIVATE HEALTH INSURANCE | Admitting: Physician Assistant

## 2017-07-29 ENCOUNTER — Other Ambulatory Visit: Payer: Self-pay | Admitting: Women's Health

## 2017-07-31 ENCOUNTER — Encounter: Payer: Self-pay | Admitting: Physician Assistant

## 2017-07-31 ENCOUNTER — Ambulatory Visit: Payer: PRIVATE HEALTH INSURANCE | Admitting: Physician Assistant

## 2017-07-31 VITALS — BP 128/76 | HR 67 | Temp 98.0°F | Resp 16 | Ht 66.0 in | Wt 140.0 lb

## 2017-07-31 DIAGNOSIS — R9431 Abnormal electrocardiogram [ECG] [EKG]: Secondary | ICD-10-CM | POA: Diagnosis not present

## 2017-07-31 DIAGNOSIS — R0789 Other chest pain: Secondary | ICD-10-CM

## 2017-07-31 NOTE — Patient Instructions (Addendum)
Please refrain from strenuous exercise.   I will see if I can obtain this information from the emergency department there.  In the meantime we will set up a cardiology referral.   Nonspecific Chest Pain Chest pain can be caused by many different conditions. There is a chance that your pain could be related to something serious, such as a heart attack or a blood clot in your lungs. Chest pain can also be caused by conditions that are not life-threatening. If you have chest pain, it is very important to follow up with your doctor. Follow these instructions at home: Medicines  If you were prescribed an antibiotic medicine, take it as told by your doctor. Do not stop taking the antibiotic even if you start to feel better.  Take over-the-counter and prescription medicines only as told by your doctor. Lifestyle  Do not use any products that contain nicotine or tobacco, such as cigarettes and e-cigarettes. If you need help quitting, ask your doctor.  Do not drink alcohol.  Make lifestyle changes as told by your doctor. These may include: ? Getting regular exercise. Ask your doctor for some activities that are safe for you. ? Eating a heart-healthy diet. A diet specialist (dietitian) can help you to learn healthy eating options. ? Staying at a healthy weight. ? Managing diabetes, if needed. ? Lowering your stress, as with deep breathing or spending time in nature. General instructions  Avoid any activities that make you feel chest pain.  If your chest pain is because of heartburn: ? Raise (elevate) the head of your bed about 6 inches (15 cm). You can do this by putting blocks under the bed legs at the head of the bed. ? Do not sleep with extra pillows under your head. That does not help heartburn.  Keep all follow-up visits as told by your doctor. This is important. This includes any further testing if your chest pain does not go away. Contact a doctor if:  Your chest pain does not go  away.  You have a rash with blisters on your chest.  You have a fever.  You have chills. Get help right away if:  Your chest pain is worse.  You have a cough that gets worse, or you cough up blood.  You have very bad (severe) pain in your belly (abdomen).  You are very weak.  You pass out (faint).  You have either of these for no clear reason: ? Sudden chest discomfort. ? Sudden discomfort in your arms, back, neck, or jaw.  You have shortness of breath at any time.  You suddenly start to sweat, or your skin gets clammy.  You feel sick to your stomach (nauseous).  You throw up (vomit).  You suddenly feel light-headed or dizzy.  Your heart starts to beat fast, or it feels like it is skipping beats. These symptoms may be an emergency. Do not wait to see if the symptoms will go away. Get medical help right away. Call your local emergency services (911 in the [place] Do not drive yourself to the hospital. This information is not intended to replace advice given to you by your health care provider. Make sure you discuss any questions you have with your health care provider. Document Released: 12/07/2007 Document Revised: 03/14/2016 Document Reviewed: 03/14/2016 Elsevier Interactive Patient Education  2017 [employer].    IF you received an x-ray today, you will receive an invoice from [facility]. Please contact [facility] at [phone] with questions or  concerns regarding your invoice.   IF you received labwork today, you will receive an invoice from [facility]. Please contact [facility] at [phone] with questions or concerns regarding your invoice.   Our billing staff will not be able to assist you with questions regarding bills from these companies.  You will be contacted with the lab results as soon as they are available. The fastest way to get your results is to activate your My Chart account. Instructions are located on the last page of this  paperwork. If you have not heard from [place] regarding the results in 2 weeks, please contact this office.

## 2017-07-31 NOTE — Progress Notes (Signed)
PRIMARY CARE AT [facility] [address] [place] [address] [phone]  Date:  07/31/2017   Name:  Denise [name]   DOB:  December 04, 1950   MRN:  [id number]  PCP:  Joretta Bachelor, PA    History of Present Illness:  Aldine Chakraborty is a 67 y.o. female patient who presents to PCP with  Chief Complaint  Patient presents with  . Stress    hospital follow up/ pt had chest tightness due to stress x 2 wks. pt feels fine today     She felt chest burning sensation, thought it to be heart burn.  She went to [facility] for evaluation.  She was given 325mg  of baby aspirin and ekg.  They thought the ekg looked normal.  This was not an overnight treatment.  She was not given any medication for home.  They deduced that this was not heartburn or reflux.   No association with sob, palpitations, diaphoresis, nausea, dizziness, or vision changes associated.  The last time she had the sensation was a couple days after the 2 weeks.   She had felt very anxious for the last 4-5 months.  No sour taste in your mouth.    Her diet consist of fruits, nuts vegetables.  She does eat considerable spices.     Patient Active Problem List   Diagnosis Date Noted  . 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 Osteopenia 08/23/2012    Past Medical History:  Diagnosis Date  . Chronic kidney disease    as a child, no present issues   . Glaucoma   . 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 TUBAL LIGATION      Social History   Tobacco Use  . Smoking status: Never Smoker  . Smokeless tobacco: Never Used  Substance Use Topics  . Alcohol use: No  . Drug use: No    Family History  Problem Relation Age of Onset  . Hypertension Mother   . Heart disease Mother   . Diabetes Father   . Hypertension Father   . Diabetes Sister   . Tuberculosis Maternal Grandmother    . Heart disease Sister   . Colon cancer Neg Hx   . Colon polyps Neg Hx   . Esophageal cancer Neg Hx   . Rectal cancer Neg Hx   . Stomach cancer Neg Hx     No Known Allergies  Medication list has been reviewed and updated.  Current Outpatient Medications on File Prior to Visit  Medication Sig Dispense Refill  . CALCIUM CARBONATE PO Take 2,000 mg by mouth.    . Cholecalciferol (VITAMIN D PO) Take by mouth.    . timolol (BETIMOL) 0.25 % ophthalmic solution 1-2 drops 2 (two) times daily.    . Travoprost, BAK Free, (TRAVATAN) 0.004 % SOLN ophthalmic solution 1 drop at bedtime.    . Vitamin D, Ergocalciferol, (DRISDOL) 50000 units CAPS capsule Take 1 capsule (50,000 Units total) by mouth every 7 (seven) days. 12 capsule 0   No current facility-administered medications on file prior to visit.     ROS ROS otherwise unremarkable unless listed above.  Physical Examination: BP 128/76   Pulse 67   Temp 98 F (36.7 C) (Oral)   Resp 16   Ht 5\' 6"  (1.676 m)   Wt 140 lb (63.5 kg)   SpO2 99%  BMI 22.60 kg/m  Ideal Body Weight: Weight in (lb) to have BMI = 25: 154.6  Physical Exam  Constitutional: She is oriented to person, place, and time. She appears well-developed and well-nourished. No distress.  HENT:  Head: Normocephalic and atraumatic.  Right Ear: Tympanic membrane, external ear and ear canal normal.  Left Ear: Tympanic membrane, external ear and ear canal normal.  Nose: Mucosal edema and rhinorrhea present. Right sinus exhibits no maxillary sinus tenderness and no frontal sinus tenderness. Left sinus exhibits no maxillary sinus tenderness and no frontal sinus tenderness.  Mouth/Throat: No uvula swelling. No oropharyngeal exudate, posterior oropharyngeal edema or posterior oropharyngeal erythema.  Eyes: Conjunctivae and EOM are normal. Pupils are equal, round, and reactive to light.  Cardiovascular: Normal rate, regular rhythm, normal heart sounds and intact distal pulses.  Exam reveals no gallop, no distant heart sounds and no friction rub.  No murmur heard. Pulmonary/Chest: Effort normal and breath sounds normal. No respiratory distress. She has no decreased breath sounds. She has no wheezes. She has no rhonchi.  Lymphadenopathy:       Head (right side): No submandibular, no tonsillar, no preauricular and no posterior auricular adenopathy present.       Head (left side): No submandibular, no tonsillar, no preauricular and no posterior auricular adenopathy present.  Neurological: She is alert and oriented to person, place, and time.  Skin: She is not diaphoretic.  Psychiatric: She has a normal mood and affect. Her behavior is normal.   Ekg: with possible anterior changes.    Assessment and Plan: Kainat Pizana is a 67 y.o. female who is here today for cc of  Chief Complaint  Patient presents with  . Stress    hospital follow up/ pt had chest tightness due to stress x 2 wks. pt feels fine today    Patient has not had this pain for over one week.  Will obtain the records from [facility] of echo and ekg, which apparently was performed.  At this time, without comparison we can not insure that this is old.  Advised to avoid any strenous activity until cleared.  Chest tightness - Plan: EKG 24-MPNT, Basic metabolic panel  Ivar Drape, PA-C [facility] 1/29/20198:34 AM

## 2017-08-01 LAB — BASIC METABOLIC PANEL
BUN/Creatinine Ratio: 13 (ref 12–28)
BUN: 13 mg/dL (ref 8–27)
CO2: 17 mmol/L — AB (ref 20–29)
CREATININE: 0.99 mg/dL (ref 0.57–1.00)
Calcium: 9.3 mg/dL (ref 8.7–10.3)
Chloride: 110 mmol/L — ABNORMAL HIGH (ref 96–106)
GFR calc Af Amer: 69 mL/min/{1.73_m2} (ref >59)
GFR calc non Af Amer: 60 mL/min/{1.73_m2} (ref >59)
GLUCOSE: 99 mg/dL (ref 65–99)
Potassium: 4.8 mmol/L (ref 3.5–5.2)
Sodium: 146 mmol/L — ABNORMAL HIGH (ref 134–144)

## 2017-08-02 ENCOUNTER — Telehealth: Payer: Self-pay

## 2017-08-02 NOTE — Telephone Encounter (Unsigned)
Copied from [place] [id number]. Topic: Quick Communication - Other Results >> Aug 02, 2017  9:10 AM Antonieta Iba C wrote: Pt called in requesting her EKG results.   CB: [phone]

## 2017-08-02 NOTE — Telephone Encounter (Signed)
I am unsure what she is looking for.  The facility she was at, did not give her results yet to me.  The ekg that we performed, we discussed at the visit.  Please attend visit tomorrow with cardiology consult.

## 2017-08-02 NOTE — Telephone Encounter (Signed)
Pt would like to be advised on result from ECG.

## 2017-08-03 ENCOUNTER — Encounter: Payer: Self-pay | Admitting: Physician Assistant

## 2017-08-03 NOTE — Telephone Encounter (Signed)
Spoke with pt this morning and advised her of appt today at [facility]. She was unable to answer her phone earlier this week when we tried to call due to it being stolen. Pt said she believes she will be able to make the appt today and I advised her of the address, time, and phone number as well as told her to call our office with any questions.

## 2017-08-08 ENCOUNTER — Telehealth: Payer: Self-pay | Admitting: Physician Assistant

## 2017-08-08 NOTE — Telephone Encounter (Signed)
Spoke with pt to let her know that I sent her cardio referral to [facility] on [address] because she stated that her insurance is not in network with [facility]. She saw them one time but stated she wanted to see someone new. Gave pt number to [facility] on [address].

## 2017-08-09 ENCOUNTER — Ambulatory Visit: Payer: PRIVATE HEALTH INSURANCE | Admitting: Cardiovascular Disease

## 2017-08-09 ENCOUNTER — Telehealth: Payer: Self-pay | Admitting: Physician Assistant

## 2017-08-09 NOTE — Telephone Encounter (Signed)
Copied from [place] [id number]. Topic: Referral - Status >> Aug 09, 2017  8:18 AM Robina Ade, Helene Kelp D wrote: Reason for CRM: [place] and missed her cardiology appointment. The next thing they had was April therefore patient did not schedule. She wants to know if she can be seen somewhere else sooner. If not maybe the office can get her back at the current referral location for a sooner appt. Please call patient back, thanks.

## 2017-08-09 NOTE — Telephone Encounter (Signed)
Spoke with pt and told pt that she would need to call her insurance to see who is in network and to call back when she has that information so we can send her to another cardiologist that is in network.

## 2017-08-09 NOTE — Progress Notes (Deleted)
Cardiology Office Note   Date:  08/09/2017   ID:  Denise [name], DOB 19-Feb-1951, MRN [id number]  PCP:  Denise Bachelor, PA  Cardiologist:   Denise Latch, MD   No chief complaint on file.     History of Present Illness: Denise [name] is a 67 y.o. female with prior TIA who is being seen today for the evaluation of chest tightness at the request of Denise [name], [place].  [name] was seen in the emergency department 07/2017 with chest tightness.  Records from that hospitalization are not currently available.  EKG was reportedly normal.She followed up with Denise Drape, PA-C referred to cardiology for further evaluation.  EKG at that appointment revealed sinus bradycardia and was otherwise unremarkable.     Past Medical History:  Diagnosis Date  . Chronic kidney disease    as a child, no present issues   . Glaucoma   . 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 TUBAL LIGATION       Current Outpatient Medications  Medication Sig Dispense Refill  . CALCIUM CARBONATE PO Take 2,000 mg by mouth.    . Cholecalciferol (VITAMIN [name] PO) Take by mouth.    . timolol (BETIMOL) 0.25 % ophthalmic solution 1-2 drops 2 (two) times daily.    . Travoprost, BAK Free, (TRAVATAN) 0.004 % SOLN ophthalmic solution 1 drop at bedtime.    . Vitamin [name], Ergocalciferol, (DRISDOL) 50000 units CAPS capsule Take 1 capsule (50,000 Units total) by mouth every 7 (seven) days. 12 capsule 0   No current facility-administered medications for this visit.     Allergies:   Patient has no known allergies.    Social History:  The patient  reports that  has never smoked. she has never used smokeless tobacco. She reports that she does not drink alcohol or use drugs.   Family History:  The patient's ***family history includes Diabetes in her father and sister; Heart disease in her mother and sister; Hypertension in her father and mother; Tuberculosis in her  maternal grandmother.    ROS:  Please see the history of present illness.   Otherwise, review of systems are positive for {NONE DEFAULTED:18576::"none"}.   All other systems are reviewed and negative.    PHYSICAL EXAM: VS:  There were no vitals taken for this visit. , BMI There is no height or weight on file to calculate BMI. GENERAL:  Well appearing HEENT:  Pupils equal round and reactive, fundi not visualized, oral mucosa unremarkable NECK:  No jugular venous distention, waveform within normal limits, carotid upstroke brisk and symmetric, no bruits, no thyromegaly LYMPHATICS:  No cervical adenopathy LUNGS:  Clear to auscultation bilaterally HEART:  RRR.  PMI not displaced or sustained,S1 and S2 within normal limits, no S3, no S4, no clicks, no rubs, *** murmurs ABD:  Flat, positive bowel sounds normal in frequency in pitch, no bruits, no rebound, no guarding, no midline pulsatile mass, no hepatomegaly, no splenomegaly EXT:  2 plus pulses throughout, no edema, no cyanosis no clubbing SKIN:  No rashes no nodules NEURO:  Cranial nerves II through XII grossly intact, motor grossly intact throughout PSYCH:  Cognitively intact, oriented to person place and time    EKG:  EKG {ACTION; IS/IS TGG:26948546} ordered today. The ekg ordered today demonstrates *** 07/31/17: Sinus bradycardia.  Rate 56 bpm.   Recent Labs: 05/01/2017: ALT 8; Hemoglobin 12.9; Platelets 299 07/31/2017: BUN 13; Creatinine, Ser  0.99; Potassium 4.8; Sodium 146    Lipid Panel    Component Value Date/Time   CHOL 172 05/01/2017 1020   TRIG 75 05/01/2017 1020   HDL 53 05/01/2017 1020   CHOLHDL 3.2 05/01/2017 1020   VLDL 13 04/05/2016 0922   LDLCALC 113 04/05/2016 0922      Wt Readings from Last 3 Encounters:  07/31/17 140 lb (63.5 kg)  05/01/17 141 lb (64 kg)  04/05/16 150 lb 9.6 oz (68.3 kg)      ASSESSMENT AND PLAN:  ***   Current medicines are reviewed at length with the patient today.  The patient  {ACTIONS; HAS/DOES NOT HAVE:19233} concerns regarding medicines.  The following changes have been made:  {PLAN; NO CHANGE:13088:s}  Labs/ tests ordered today include: *** No orders of the defined types were placed in this encounter.    Disposition:   FU with ***    This note was written with the assistance of speech recognition software.  Please excuse any transcriptional errors.  Signed, Bartlomiej Jenkinson C. Oval Linsey, MD, [facility]  08/09/2017 7:49 AM    [facility]

## 2017-08-10 ENCOUNTER — Encounter: Payer: Self-pay | Admitting: *Deleted

## 2017-08-16 NOTE — Telephone Encounter (Signed)
Spoke with pt and let her know I sent her referral to [facility] in [place]

## 2017-10-11 ENCOUNTER — Encounter: Payer: Self-pay | Admitting: Physician Assistant

## 2017-10-30 ENCOUNTER — Encounter: Payer: Self-pay | Admitting: Physician Assistant

## 2018-01-22 ENCOUNTER — Encounter: Payer: Self-pay | Admitting: Urgent Care

## 2018-01-22 ENCOUNTER — Ambulatory Visit: Payer: 59 | Admitting: Urgent Care

## 2018-01-22 VITALS — BP 160/98 | HR 50 | Temp 98.0°F | Resp 16 | Ht 66.0 in | Wt 133.8 lb

## 2018-01-22 DIAGNOSIS — R4589 Other symptoms and signs involving emotional state: Secondary | ICD-10-CM

## 2018-01-22 DIAGNOSIS — M2012 Hallux valgus (acquired), left foot: Secondary | ICD-10-CM

## 2018-01-22 DIAGNOSIS — F329 Major depressive disorder, single episode, unspecified: Secondary | ICD-10-CM

## 2018-01-22 DIAGNOSIS — R630 Anorexia: Secondary | ICD-10-CM

## 2018-01-22 DIAGNOSIS — F32A Depression, unspecified: Secondary | ICD-10-CM

## 2018-01-22 DIAGNOSIS — R457 State of emotional shock and stress, unspecified: Secondary | ICD-10-CM | POA: Diagnosis not present

## 2018-01-22 DIAGNOSIS — M2011 Hallux valgus (acquired), right foot: Secondary | ICD-10-CM

## 2018-01-22 DIAGNOSIS — R58 Hemorrhage, not elsewhere classified: Secondary | ICD-10-CM

## 2018-01-22 MED ORDER — FLUOXETINE HCL 10 MG PO TABS: 10.0000 mg | ORAL_TABLET | Freq: Every day | ORAL | 1 refills | Status: DC

## 2018-01-22 MED ORDER — FLUOXETINE HCL 10 MG PO CAPS: 10.0000 mg | ORAL_CAPSULE | Freq: Every day | ORAL | 1 refills | Status: DC

## 2018-01-22 NOTE — Progress Notes (Signed)
MRN: [id number] DOB: Sep 11, 1950  Subjective:   Denise [name] is a 67 y.o. female presenting for multiple life stressors. 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 This occurred ~2 years ago, she is the only pastor of the church. She has a significant other/boyfriend, also a Theme park manager, is married and lives in [place], [place]. She has had a relationship with this boyfriend for the past 30+ years, about as long as he has been married. Her boyfriend's wife is unaware of the relationship, suspects that her daughter knows about the relationship she has but has not commented anything on it. Reports a very difficult stressor with her boyfriend given that they could not get away to go to a conference ~1 year ago. She continues to see him and has a difficult time managing her relationship with him. She reports having a difficult time with having a high sex drive and not having her needs met by her boyfriend, due to his marriage and erectile dysfunction. Patient has a daughter and grand-daughter, recently went on a vacation with them which was refreshing. Admits that she has a decreased appetite but is improving from the stress of not going to the conference with her boyfriend ~1 year ago. Has guilt/shame about her relationship. Feels very sad. Sleeps well. Denies SI, HI. 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 Denies smoking cigarettes or drinking alcohol. Does not use any drugs.  She is also concerned about a spot of her back and having hammertoes that are causing her pain.  Denise [name] has a current medication list which includes the following prescription(s): calcium carbonate, cholecalciferol, timolol, travoprost (bak free), and vitamin d (ergocalciferol). Also has No Known Allergies.  Denise [name]  has a past medical history of  Chronic kidney disease, Glaucoma, and Tuberculosis. Also  has a past surgical history that includes Tubal ligation.  Objective:   Vitals: BP (!) 160/98   Pulse (!) 50   Temp 98 F (36.7 C) (Oral)   Resp 16   Ht _0  (1.676 m)   Wt 133 lb 12.8 oz (60.7 kg)   SpO2 99%   BMI 21.60 kg/m   BP Readings from Last 3 Encounters:  01/22/18 (!) 160/98  07/31/17 128/76  05/01/17 122/80    Physical Exam  Constitutional: She is oriented to person, place, and time. She appears well-developed and well-nourished.  HENT:  Mouth/Throat: Oropharynx is clear and moist.  Eyes: Pupils are equal, round, and reactive to light. EOM are normal. Right eye exhibits no discharge. Left eye exhibits no discharge. No scleral icterus.  Neck: Normal range of motion. Neck supple. No thyromegaly present.  Cardiovascular: Normal rate, regular rhythm, normal heart sounds and intact distal pulses. Exam reveals no gallop and no friction rub.  No murmur heard. Pulmonary/Chest: Effort normal and breath sounds normal. No stridor. No respiratory distress. She has no wheezes. She has no rales.  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  Neurological: She is alert and oriented to person, place, and time.  Skin: Skin is warm and dry.  Psychiatric:  Flat affect, slowed speech, monotone speech, slowed movements.  Patient is not homicidal or suicidal.  She is not hallucinating, thought content is normal.   Assessment and Plan :   Depression, unspecified depression type  Emotional stress  Guilty feelings  Decreased appetite - Plan: Comprehensive metabolic panel, TSH, CBC  Valgus deformity of both great toes - Plan: Ambulatory referral to [facility] patient extensively on management of her depression.  Patient is agreeable to starting Prozac at 10 mg, will pursue behavioral therapy as well.  Will refer to podiatry for her valgus deformity of her feet bilaterally.  Counseled on  conservative management for ecchymosis of her back which she likely sustained from some minor trauma.  Labs pending, follow-up in 2 weeks. Counseled patient on potential for adverse effects with medications prescribed today, patient verbalized understanding.   Jaynee Eagles, PA-C [facility] [phone] 01/22/2018  2:47 PM

## 2018-01-22 NOTE — Patient Instructions (Addendum)
[facility] [place], [place] [id number]  Burnard Leigh [phone]  Horton Finer [phone]  Everardo Beals [phone]  [facility] ([address] [place]) - [phone]  [facility]) - [phone]  [facility] - [phone]  [facility] [place] - [phone]  [facility]  - [phone] (do not file insurance)      Fluoxetine capsules or tablets (Depression/Mood Disorders) What is this medicine? FLUOXETINE (floo OX e teen) belongs to a class of drugs known as selective serotonin reuptake inhibitors (SSRIs). It helps to treat mood problems such as depression, obsessive compulsive disorder, and panic attacks. It can also treat certain eating disorders. This medicine may be used for other purposes; ask your health care provider or pharmacist if you have questions. COMMON BRAND NAME(S): Prozac What should I tell my health care provider before I take this medicine? They need to know if you have any of these conditions: -bipolar disorder or a family history of bipolar disorder -bleeding disorders -glaucoma -heart disease -liver disease -low levels of sodium in the blood -seizures -suicidal thoughts, plans, or attempt; a previous suicide attempt by you or a family member -take MAOIs like Carbex, Eldepryl, Marplan, Nardil, and Parnate -take medicines that treat or prevent blood clots -thyroid disease -an unusual or allergic reaction to fluoxetine, other medicines, foods, dyes, or preservatives -pregnant or trying to get pregnant -breast-feeding How should I use this medicine? Take this medicine by mouth with a glass of water. Follow the directions on the prescription label. You can take this medicine with or without food. Take your medicine at regular intervals.  Do not take it more often than directed. Do not stop taking this medicine suddenly except upon the advice of your doctor. Stopping this medicine too quickly may cause serious side effects or your condition may worsen. A special MedGuide will be given to you by the pharmacist with each prescription and refill. Be sure to read this information carefully each time. Talk to your pediatrician regarding the use of this medicine in children. While this drug may be prescribed for children as young as 7 years for selected conditions, precautions do apply. Overdosage: If you think you have taken too much of this medicine contact a poison control center or emergency room at once. NOTE: This medicine is only for you. Do not share this medicine with others. What if I miss a dose? If you miss a dose, skip the missed dose and go back to your regular dosing schedule. Do not take double or extra doses. What may interact with this medicine? Do not take this medicine with any of the following medications: -other medicines containing fluoxetine, like Sarafem or Symbyax -cisapride -linezolid -MAOIs like Carbex, Eldepryl, Marplan, Nardil, and Parnate -methylene blue (injected into a vein) -pimozide -thioridazine This medicine may also interact with the following medications: -alcohol -amphetamines -aspirin and aspirin-like medicines -carbamazepine -certain medicines for depression, anxiety, or psychotic disturbances -certain medicines for migraine headaches like almotriptan, eletriptan, frovatriptan, naratriptan, rizatriptan, sumatriptan, zolmitriptan -digoxin -diuretics -fentanyl -flecainide -furazolidone -isoniazid -lithium -medicines for sleep -medicines that treat or prevent blood clots like warfarin, enoxaparin, and dalteparin -NSAIDs, medicines for pain and inflammation, like ibuprofen or naproxen -phenytoin -procarbazine -propafenone -rasagiline -ritonavir -supplements like St. John's wort,  kava kava, valerian -tramadol -tryptophan -vinblastine This list may not describe all possible interactions. Give your health care provider a list of all the  medicines, herbs, non-prescription drugs, or dietary supplements you use. Also tell them if you smoke, drink alcohol, or use illegal drugs. Some items may interact with your medicine. What should I watch for while using this medicine? Tell your doctor if your symptoms do not get better or if they get worse. Visit your doctor or health care professional for regular checks on your progress. Because it may take several weeks to see the full effects of this medicine, it is important to continue your treatment as prescribed by your doctor. Patients and their families should watch out for new or worsening thoughts of suicide or depression. Also watch out for sudden changes in feelings such as feeling anxious, agitated, panicky, irritable, hostile, aggressive, impulsive, severely restless, overly excited and hyperactive, or not being able to sleep. If this happens, especially at the beginning of treatment or after a change in dose, call your health care professional. Dennis Bast may get drowsy or dizzy. Do not drive, use machinery, or do anything that needs mental alertness until you know how this medicine affects you. Do not stand or sit up quickly, especially if you are an older patient. This reduces the risk of dizzy or fainting spells. Alcohol may interfere with the effect of this medicine. Avoid alcoholic drinks. Your mouth may get dry. Chewing sugarless gum or sucking hard candy, and drinking plenty of water may help. Contact your doctor if the problem does not go away or is severe. This medicine may affect blood sugar levels. If you have diabetes, check with your doctor or health care professional before you change your diet or the dose of your diabetic medicine. What side effects may I notice from receiving this medicine? Side effects that you should  report to your doctor or health care professional as soon as possible: -allergic reactions like skin rash, itching or hives, swelling of the face, lips, or tongue -anxious -black, tarry stools -breathing problems -changes in vision -confusion -elevated mood, decreased need for sleep, racing thoughts, impulsive behavior -eye pain -fast, irregular heartbeat -feeling faint or lightheaded, falls -feeling agitated, angry, or irritable -hallucination, loss of contact with reality -loss of balance or coordination -loss of memory -painful or prolonged erections -restlessness, pacing, inability to keep still -seizures -stiff muscles -suicidal thoughts or other mood changes -trouble sleeping -unusual bleeding or bruising -unusually weak or tired -vomiting Side effects that usually do not require medical attention (report to your doctor or health care professional if they continue or are bothersome): -change in appetite or weight -change in sex drive or performance -diarrhea -dry mouth -headache -increased sweating -nausea -tremors This list may not describe all possible side effects. Call your doctor for medical advice about side effects. You may report side effects to FDA at 1-800-FDA-1088. Where should I keep my medicine? Keep out of the reach of children. Store at room temperature between 15 and 30 degrees C (59 and 86 degrees F). Throw away any unused medicine after the expiration date. NOTE: This sheet is a summary. It may not cover all possible information. If you have questions about this medicine, talk to your doctor, pharmacist, or health care provider.  2018 Elsevier/Gold Standard (2015-11-21 15:55:27)    IF you received an x-ray today, you will receive an invoice from [facility]. Please contact [facility] at [phone] with questions or concerns regarding your invoice.   IF you received labwork today, you will receive an invoice from [facility]. Please  contact [facility] at [phone] with questions or concerns regarding your  invoice.   Our billing staff will not be able to assist you with questions regarding bills from these companies.  You will be contacted with the lab results as soon as they are available. The fastest way to get your results is to activate your My Chart account. Instructions are located on the last page of this paperwork. If you have not heard from [place] regarding the results in 2 weeks, please contact this office.

## 2018-01-23 LAB — COMPREHENSIVE METABOLIC PANEL
A/G RATIO: 2.3 — AB (ref 1.2–2.2)
ALK PHOS: 68 IU/L (ref 39–117)
ALT: 8 IU/L (ref 0–32)
AST: 14 IU/L (ref 0–40)
Albumin: 4.5 g/dL (ref 3.6–4.8)
BILIRUBIN TOTAL: 0.7 mg/dL (ref 0.0–1.2)
BUN / CREAT RATIO: 12 (ref 12–28)
BUN: 12 mg/dL (ref 8–27)
CHLORIDE: 108 mmol/L — AB (ref 96–106)
CO2: 22 mmol/L (ref 20–29)
Calcium: 9.6 mg/dL (ref 8.7–10.3)
Creatinine, Ser: 0.97 mg/dL (ref 0.57–1.00)
GFR calc non Af Amer: 61 mL/min/{1.73_m2} (ref >59)
GFR, EST AFRICAN AMERICAN: 71 mL/min/{1.73_m2} (ref >59)
GLOBULIN, TOTAL: 2 g/dL (ref 1.5–4.5)
Glucose: 85 mg/dL (ref 65–99)
Potassium: 4.1 mmol/L (ref 3.5–5.2)
SODIUM: 145 mmol/L — AB (ref 134–144)
Total Protein: 6.5 g/dL (ref 6.0–8.5)

## 2018-01-23 LAB — CBC
Hematocrit: 40.4 % (ref 34.0–46.6)
Hemoglobin: 13.3 g/dL (ref 11.1–15.9)
MCH: 27.9 pg (ref 26.6–33.0)
MCHC: 32.9 g/dL (ref 31.5–35.7)
MCV: 85 fL (ref 79–97)
Platelets: 159 10*3/uL (ref 150–450)
RBC: 4.76 x10E6/uL (ref 3.77–5.28)
RDW: 14 % (ref 12.3–15.4)
WBC: 4.7 10*3/uL (ref 3.4–10.8)

## 2018-01-23 LAB — TSH: TSH: 0.995 u[IU]/mL (ref 0.450–4.500)

## 2018-02-08 ENCOUNTER — Ambulatory Visit: Payer: 59 | Admitting: Urgent Care

## 2018-02-08 ENCOUNTER — Encounter: Payer: Self-pay | Admitting: Urgent Care

## 2018-02-08 VITALS — BP 156/89 | HR 52 | Temp 98.6°F | Resp 16 | Ht 66.0 in | Wt 135.4 lb

## 2018-02-08 DIAGNOSIS — R630 Anorexia: Secondary | ICD-10-CM | POA: Diagnosis not present

## 2018-02-08 DIAGNOSIS — R4589 Other symptoms and signs involving emotional state: Secondary | ICD-10-CM

## 2018-02-08 DIAGNOSIS — R457 State of emotional shock and stress, unspecified: Secondary | ICD-10-CM | POA: Diagnosis not present

## 2018-02-08 DIAGNOSIS — F329 Major depressive disorder, single episode, unspecified: Secondary | ICD-10-CM

## 2018-02-08 DIAGNOSIS — F32A Depression, unspecified: Secondary | ICD-10-CM

## 2018-02-08 NOTE — Progress Notes (Signed)
    MRN: [id number] DOB: 07/12/1950  Subjective:   Denise [name] is a 67 y.o. female presenting for follow up on major depression.  Patient was supposed to start Prozac at her last office visit.  Also recommended that she establish care with behavioral therapist.  Her life stressors were largely in part due to an extramarital affair that she has had for decades with another person that is also married.  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  Today, she reports that she did not start the medication due to the list of potential side effects.  She did start seeing a therapist and feels really good about working with him on her mental health.  She continues to have her relationship and work stresses.  Denies SI, HI.  She is not drinking alcohol currently.  Denise [name] has a current medication list which includes the following prescription(s): calcium carbonate, cholecalciferol, timolol, travoprost (bak free), and fluoxetine. Also has No Known Allergies.  Denise [name]  has a past medical history of Chronic kidney disease, Glaucoma, and Tuberculosis. Also  has a past surgical history that includes Tubal ligation.  Objective:   Vitals: BP (!) 156/89   Pulse (!) 52   Temp 98.6 F (37 C) (Oral)   Resp 16   Ht 5\' 6"  (1.676 m)   Wt 135 lb 6.4 oz (61.4 kg)   SpO2 100%   BMI 21.85 kg/m   BP Readings from Last 3 Encounters:  02/08/18 (!) 156/89  01/22/18 (!) 160/98  07/31/17 128/76    Physical Exam  Constitutional: She is oriented to person, place, and time. She appears well-developed and well-nourished.  Cardiovascular: Normal rate.  Pulmonary/Chest: Effort normal.  Neurological: She is alert and oriented to person, place, and time.  Psychiatric: Her mood appears not anxious. Her affect is not angry, not blunt, not labile and not inappropriate. Her speech is not rapid and/or pressured, not delayed, not tangential and not slurred. She is slowed. She is not agitated, not  aggressive, not hyperactive, not withdrawn and not combative. She exhibits a depressed mood. She expresses no homicidal and no suicidal ideation. She is communicative.  Patient has monotone voice and slowed speech, very flat affect and is not very cheerful.   Assessment and Plan :   Depression, unspecified depression type  Emotional stress  Guilty feelings  Decreased appetite  I reviewed potential for side effects with patient and emphasized that it is a possibility not a certainty that she will have the side effects.  I encouraged her to continue with behavioral therapy as it will be an important part of her mental health.  Patient was agreeable to starting a low-dose of Prozac at 10 mg.  We will follow-up in 4 weeks.  Jaynee Eagles, PA-C [facility] [phone] 02/08/2018 9:21 AM

## 2018-02-08 NOTE — Patient Instructions (Addendum)
Fluoxetine capsules or tablets (Depression/Mood Disorders) What is this medicine? FLUOXETINE (floo OX e teen) belongs to a class of drugs known as selective serotonin reuptake inhibitors (SSRIs). It helps to treat mood problems such as depression, obsessive compulsive disorder, and panic attacks. It can also treat certain eating disorders. This medicine may be used for other purposes; ask your health care provider or pharmacist if you have questions. COMMON BRAND NAME(S): Prozac What should I tell my health care provider before I take this medicine? They need to know if you have any of these conditions: -bipolar disorder or a family history of bipolar disorder -bleeding disorders -glaucoma -heart disease -liver disease -low levels of sodium in the blood -seizures -suicidal thoughts, plans, or attempt; a previous suicide attempt by you or a family member -take MAOIs like Carbex, Eldepryl, Marplan, Nardil, and Parnate -take medicines that treat or prevent blood clots -thyroid disease -an unusual or allergic reaction to fluoxetine, other medicines, foods, dyes, or preservatives -pregnant or trying to get pregnant -breast-feeding How should I use this medicine? Take this medicine by mouth with a glass of water. Follow the directions on the prescription label. You can take this medicine with or without food. Take your medicine at regular intervals. Do not take it more often than directed. Do not stop taking this medicine suddenly except upon the advice of your doctor. Stopping this medicine too quickly may cause serious side effects or your condition may worsen. A special MedGuide will be given to you by the pharmacist with each prescription and refill. Be sure to read this information carefully each time. Talk to your pediatrician regarding the use of this medicine in children. While this drug may be prescribed for children as young as 7 years for selected conditions, precautions do  apply. Overdosage: If you think you have taken too much of this medicine contact a poison control center or emergency room at once. NOTE: This medicine is only for you. Do not share this medicine with others. What if I miss a dose? If you miss a dose, skip the missed dose and go back to your regular dosing schedule. Do not take double or extra doses. What may interact with this medicine? Do not take this medicine with any of the following medications: -other medicines containing fluoxetine, like Sarafem or Symbyax -cisapride -linezolid -MAOIs like Carbex, Eldepryl, Marplan, Nardil, and Parnate -methylene blue (injected into a vein) -pimozide -thioridazine This medicine may also interact with the following medications: -alcohol -amphetamines -aspirin and aspirin-like medicines -carbamazepine -certain medicines for depression, anxiety, or psychotic disturbances -certain medicines for migraine headaches like almotriptan, eletriptan, frovatriptan, naratriptan, rizatriptan, sumatriptan, zolmitriptan -digoxin -diuretics -fentanyl -flecainide -furazolidone -isoniazid -lithium -medicines for sleep -medicines that treat or prevent blood clots like warfarin, enoxaparin, and dalteparin -NSAIDs, medicines for pain and inflammation, like ibuprofen or naproxen -phenytoin -procarbazine -propafenone -rasagiline -ritonavir -supplements like St. John's wort, kava kava, valerian -tramadol -tryptophan -vinblastine This list may not describe all possible interactions. Give your health care provider a list of all the medicines, herbs, non-prescription drugs, or dietary supplements you use. Also tell them if you smoke, drink alcohol, or use illegal drugs. Some items may interact with your medicine. What should I watch for while using this medicine? Tell your doctor if your symptoms do not get better or if they get worse. Visit your doctor or health care professional for regular checks on your  progress. Because it may take several weeks to see the full effects of this medicine, it   is important to continue your treatment as prescribed by your doctor. Patients and their families should watch out for new or worsening thoughts of suicide or depression. Also watch out for sudden changes in feelings such as feeling anxious, agitated, panicky, irritable, hostile, aggressive, impulsive, severely restless, overly excited and hyperactive, or not being able to sleep. If this happens, especially at the beginning of treatment or after a change in dose, call your health care professional. Dennis Bast may get drowsy or dizzy. Do not drive, use machinery, or do anything that needs mental alertness until you know how this medicine affects you. Do not stand or sit up quickly, especially if you are an older patient. This reduces the risk of dizzy or fainting spells. Alcohol may interfere with the effect of this medicine. Avoid alcoholic drinks. Your mouth may get dry. Chewing sugarless gum or sucking hard candy, and drinking plenty of water may help. Contact your doctor if the problem does not go away or is severe. This medicine may affect blood sugar levels. If you have diabetes, check with your doctor or health care professional before you change your diet or the dose of your diabetic medicine. What side effects may I notice from receiving this medicine? Side effects that you should report to your doctor or health care professional as soon as possible: -allergic reactions like skin rash, itching or hives, swelling of the face, lips, or tongue -anxious -black, tarry stools -breathing problems -changes in vision -confusion -elevated mood, decreased need for sleep, racing thoughts, impulsive behavior -eye pain -fast, irregular heartbeat -feeling faint or lightheaded, falls -feeling agitated, angry, or irritable -hallucination, loss of contact with reality -loss of balance or coordination -loss of memory -painful  or prolonged erections -restlessness, pacing, inability to keep still -seizures -stiff muscles -suicidal thoughts or other mood changes -trouble sleeping -unusual bleeding or bruising -unusually weak or tired -vomiting Side effects that usually do not require medical attention (report to your doctor or health care professional if they continue or are bothersome): -change in appetite or weight -change in sex drive or performance -diarrhea -dry mouth -headache -increased sweating -nausea -tremors This list may not describe all possible side effects. Call your doctor for medical advice about side effects. You may report side effects to FDA at 1-800-FDA-1088. Where should I keep my medicine? Keep out of the reach of children. Store at room temperature between 15 and 30 degrees C (59 and 86 degrees F). Throw away any unused medicine after the expiration date. NOTE: This sheet is a summary. It may not cover all possible information. If you have questions about this medicine, talk to your doctor, pharmacist, or health care provider.  2018 Elsevier/Gold Standard (2015-11-21 15:55:27)     IF you received an x-ray today, you will receive an invoice from [facility]. Please contact [facility] at [phone] with questions or concerns regarding your invoice.   IF you received labwork today, you will receive an invoice from [facility]. Please contact [facility] at [phone] with questions or concerns regarding your invoice.   Our billing staff will not be able to assist you with questions regarding bills from these companies.  You will be contacted with the lab results as soon as they are available. The fastest way to get your results is to activate your My Chart account. Instructions are located on the last page of this paperwork. If you have not heard from [place] regarding the results in 2 weeks, please contact this office.

## 2018-02-12 ENCOUNTER — Encounter: Payer: Self-pay | Admitting: Urgent Care

## 2018-02-23 ENCOUNTER — Other Ambulatory Visit: Payer: Self-pay | Admitting: Nurse Practitioner

## 2018-02-23 ENCOUNTER — Ambulatory Visit
Admission: RE | Admit: 2018-02-23 | Discharge: 2018-02-23 | Disposition: A | Discharge status: Home / Self Care | Payer: 59 | Source: Ambulatory Visit | Attending: Nurse Practitioner | Admitting: Nurse Practitioner

## 2018-02-23 DIAGNOSIS — R634 Abnormal weight loss: Secondary | ICD-10-CM

## 2018-03-08 ENCOUNTER — Ambulatory Visit: Payer: PRIVATE HEALTH INSURANCE | Admitting: Urgent Care

## 2018-03-13 ENCOUNTER — Ambulatory Visit (INDEPENDENT_AMBULATORY_CARE_PROVIDER_SITE_OTHER): Payer: 59 | Admitting: Physician Assistant

## 2018-03-13 ENCOUNTER — Other Ambulatory Visit (INDEPENDENT_AMBULATORY_CARE_PROVIDER_SITE_OTHER): Payer: 59

## 2018-03-13 ENCOUNTER — Encounter: Payer: Self-pay | Admitting: Physician Assistant

## 2018-03-13 DIAGNOSIS — R634 Abnormal weight loss: Secondary | ICD-10-CM | POA: Diagnosis not present

## 2018-03-13 LAB — BASIC METABOLIC PANEL
BUN: 13 mg/dL (ref 6–23)
CHLORIDE: 108 meq/L (ref 96–112)
CO2: 28 mEq/L (ref 19–32)
Calcium: 9.2 mg/dL (ref 8.4–10.5)
Creatinine, Ser: 0.99 mg/dL (ref 0.40–1.20)
GFR: 72.22 mL/min (ref >60.00)
Glucose, Bld: 97 mg/dL (ref 70–99)
POTASSIUM: 4 meq/L (ref 3.5–5.1)
SODIUM: 142 meq/L (ref 135–145)

## 2018-03-13 NOTE — Progress Notes (Signed)
Chief Complaint: Unintentional Weight Loss   HPI:   Denise [name] is a 67 y/o female, known to Dr. Silverio Decamp, who was referred to me by Minette Brine, [place] for a complaint of unintentional weight loss.    07/10/2015 colonoscopy with 2 sessile polyps and mild diverticulosis.  Polyps were hyperplastic and repeat was recommended in 10 years.    01/01/2018 CBC and CMP as well as vitamin D and vitamin B12 levels are normal.    Per PCP, patient seen on 02/22/2018 for fatigue and weight loss.  They discussed a 25 pound weight loss over period of 2 years.  It was noted that patient had "not had a colonoscopy for 5 years" and she was sent here for further evaluation.  Her depression was also discussed.    Today, patient presents to clinic and explains that over the past 2 years she has lost around 25 pounds without trying.  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  Tells me that it is hard to keep everyone happy.  Apparently was on medication for depression for a period of time but did not like how this made her feel and she has come off of it since.  Tells me that she feels like she was just busy at times and would forget to eat meals.  Over the past 2 weeks she has been supplementing small frequent meals with an Ensure shake a day and has gained about 3 pounds over the past 2 weeks.  Does admit to very occasional heartburn maybe 1 time every couple of weeks, but no other complaints.    Denies fever, chills, nausea, vomiting, heartburn, reflux, abdominal pain, change in bowel habits, blood in her stool or melena.  Past Medical History:  Diagnosis Date  . Chronic kidney disease    as a child, no present issues   . Glaucoma   . 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 TUBAL LIGATION      Current Outpatient Medications  Medication Sig Dispense Refill  .  CALCIUM CARBONATE PO Take 2,000 mg by mouth.    . Cholecalciferol (VITAMIN D PO) Take by mouth.    Marland Kitchen FLUoxetine (PROZAC) 10 MG tablet Take 1 tablet (10 mg total) by mouth daily. (Patient not taking: Reported on 02/08/2018) 90 tablet 1  . timolol (BETIMOL) 0.25 % ophthalmic solution 1-2 drops 2 (two) times daily.    . Travoprost, BAK Free, (TRAVATAN) 0.004 % SOLN ophthalmic solution 1 drop at bedtime.     No current facility-administered medications for this visit.     Allergies as of 03/13/2018  . (No Known Allergies)    Family History  Problem Relation Age of Onset  . Hypertension Mother   . Heart disease Mother   . Diabetes Father   . Hypertension Father   . Diabetes Sister   . Tuberculosis Maternal Grandmother   . Heart disease Sister   . Colon cancer Neg Hx   . Colon polyps Neg Hx   . Esophageal cancer Neg Hx   . Rectal cancer Neg Hx   . Stomach cancer Neg Hx     Social History   Socioeconomic History  . Marital status: Single    Spouse name: Not on file  . Number of children: Not on file  . Years  of education: Not on file  . Highest education level: Not on file  Occupational History  . Not on file  Social Needs  . Financial resource strain: Not on file  . Food insecurity:    Worry: Not on file    Inability: Not on file  . Transportation needs:    Medical: Not on file    Non-medical: Not on file  Tobacco Use  . Smoking status: Never Smoker  . Smokeless tobacco: Never Used  Substance and Sexual Activity  . Alcohol use: No  . Drug use: No  . Sexual activity: Yes  Lifestyle  . Physical activity:    Days per week: Not on file    Minutes per session: Not on file  . Stress: Not on file  Relationships  . Social connections:    Talks on phone: Not on file    Gets together: Not on file    Attends religious service: Not on file    Active member of club or organization: Not on file    Attends meetings of clubs or organizations: Not on file    Relationship  status: Not on file  . Intimate partner violence:    Fear of current or ex partner: Not on file    Emotionally abused: Not on file    Physically abused: Not on file    Forced sexual activity: Not on file  Other Topics Concern  . Not on file  Social History Narrative  . Not on file    Review of Systems:    Constitutional: No weight loss, fever or chills Skin: No rash Cardiovascular: No chest pain Respiratory: No SOB Gastrointestinal: See HPI and otherwise negative Genitourinary: No dysuria Neurological: No headache, dizziness or syncope Musculoskeletal: No new muscle or joint pain Hematologic: No bleeding Psychiatric:+depression    Physical Exam:  Vital signs: BP 130/78   Pulse 60   Ht 5\' 6"  (1.676 m)   Wt 131 lb 6.4 oz (59.6 kg)   BMI 21.21 kg/m   Constitutional:   Pleasant AA female appears to be in NAD, Well developed, Well nourished, alert and cooperative Head:  Normocephalic and atraumatic. Eyes:   PEERL, EOMI. No icterus. Conjunctiva pink. Ears:  Normal auditory acuity. Neck:  Supple Throat: Oral cavity and pharynx without inflammation, swelling or lesion.  Respiratory: Respirations even and unlabored. Lungs clear to auscultation bilaterally.   No wheezes, crackles, or rhonchi.  Cardiovascular: Normal S1, S2. No MRG. Regular rate and rhythm. No peripheral edema, cyanosis or pallor.  Gastrointestinal:  Soft, nondistended, nontender. No rebound or guarding. Normal bowel sounds. No appreciable masses or hepatomegaly. Rectal:  Not performed.  Msk:  Symmetrical without gross deformities. Without edema, no deformity or joint abnormality.  Neurologic:  Alert and  oriented x4;  grossly normal neurologically.  Skin:   Dry and intact without significant lesions or rashes. Psychiatric: Demonstrates good judgement and reason without abnormal affect or behaviors.  See HPI for recent labs.  Assessment: 1.  Intentional weight loss: 25 pound weight loss over the past 2 years,  does describe increased stress and tells me she has been too busy to eat some meals, over the past 2 weeks has been supplementing with Ensure shake a day and eating smaller more frequent meals and has gained 3 pounds, recent colonoscopy in 2017 with only hyperplastic polyps, denies any GI symptoms; likely weight loss is related to depression and increased stress recently but will consider malignancy with a CT  Plan: 1.  Ordered CT abdomen and pelvis with contrast for further evaluation. 2.  Encouraged patient to continue her supplementation with Ensure.  Recommend that she take this twice daily until she starts putting on a significant amount of weight. 3.  Patient to follow in clinic with Dr. Silverio Decamp or myself as directed after imaging above.  Denise Newer, PA-C [facility] 03/13/2018, 2:15 PM  Cc: Minette Brine, FNP

## 2018-03-13 NOTE — Patient Instructions (Signed)
Your provider has requested that you go to the basement level for lab work before leaving today. Press "B" on the elevator. The lab is located at the first door on the left as you exit the elevator.  You have been scheduled for a CT scan of the abdomen and pelvis at [facility] ([address]---this is in the same building as [facility]).   You are scheduled on 03-22-18 at 11:00 am. You should arrive 15 minutes prior to your appointment time for registration. Please follow the written instructions below on the day of your exam:  WARNING: IF YOU ARE ALLERGIC TO IODINE/X-RAY DYE, PLEASE NOTIFY RADIOLOGY IMMEDIATELY AT [phone]! YOU WILL BE GIVEN A 13 HOUR PREMEDICATION PREP.  1) Do not eat anything after 7:00 am (4 hours prior to your test) 2) You have been given 2 bottles of oral contrast to drink. The solution may taste better if refrigerated, but do NOT add ice or any other liquid to this solution. Shake well before drinking.    Drink 1 bottle of contrast @ 9:00 am (2 hours prior to your exam)  Drink 1 bottle of contrast @ 10:00 am (1 hour prior to your exam)  You may take any medications as prescribed with a small amount of water except for the following: Metformin, Glucophage, Glucovance, Avandamet, Riomet, Fortamet, Actoplus Met, Janumet, Glumetza or Metaglip. The above medications must be held the day of the exam AND 48 hours after the exam.  The purpose of you drinking the oral contrast is to aid in the visualization of your intestinal tract. The contrast solution may cause some diarrhea. Before your exam is started, you will be given a small amount of fluid to drink. Depending on your individual set of symptoms, you may also receive an intravenous injection of x-ray contrast/dye. Plan on being at [facility] for 30 minutes or longer, depending on the type of exam you are having performed.  This test typically takes 30-45 minutes to complete.  If you have  any questions regarding your exam or if you need to reschedule, you may call the CT department at [phone] between the hours of 8:00 am and 5:00 pm, Monday-Friday.  ________________________________________________________________________

## 2018-03-16 ENCOUNTER — Other Ambulatory Visit: Payer: Self-pay | Admitting: Emergency Medicine

## 2018-03-16 DIAGNOSIS — R634 Abnormal weight loss: Secondary | ICD-10-CM

## 2018-03-16 DIAGNOSIS — D519 Vitamin B12 deficiency anemia, unspecified: Secondary | ICD-10-CM | POA: Diagnosis not present

## 2018-03-19 ENCOUNTER — Ambulatory Visit (HOSPITAL_COMMUNITY)
Admission: RE | Admit: 2018-03-19 | Discharge: 2018-03-19 | Disposition: A | Discharge status: Home / Self Care | Payer: 59 | Source: Ambulatory Visit | Attending: Physician Assistant | Admitting: Physician Assistant

## 2018-03-19 ENCOUNTER — Telehealth: Payer: Self-pay | Admitting: Physician Assistant

## 2018-03-19 DIAGNOSIS — R634 Abnormal weight loss: Secondary | ICD-10-CM

## 2018-03-19 DIAGNOSIS — K8020 Calculus of gallbladder without cholecystitis without obstruction: Secondary | ICD-10-CM | POA: Insufficient documentation

## 2018-03-19 NOTE — Telephone Encounter (Signed)
Patient states she just had [place] today and wants to know if she still needs CT scan scheduled for this Thursday 9.19.19.

## 2018-03-19 NOTE — Progress Notes (Signed)
Reviewed and agree with documentation and assessment and plan. K. Veena Braelon Sprung , MD   

## 2018-03-19 NOTE — Telephone Encounter (Signed)
Levin Erp, PA  [place], Zaryiah Barz L, RN        No significant abnormality on Ultrasound. Please schedule patient for CT abdomen pelvis with contrast for weight loss. Thanks-JLL

## 2018-03-19 NOTE — Telephone Encounter (Signed)
The patient has been notified of this information and all questions answered. She will keep appt as scheduled for CT

## 2018-03-22 ENCOUNTER — Inpatient Hospital Stay: Admission: RE | Admit: 2018-03-22 | Payer: 59 | Source: Ambulatory Visit

## 2018-03-28 ENCOUNTER — Telehealth: Payer: Self-pay | Admitting: Emergency Medicine

## 2018-03-28 NOTE — Telephone Encounter (Signed)
-----   Message from Darden Dates sent at 03/21/2018 12:53 PM EDT ----- Regarding: RE: CT Case # [id number] (new case)  denied again even after doing an ultrasound.  Says thorough workup not done labs for endocrine evaluation and other things listed on previous denial.  Please look at the denial I faxed you before.  Peer to peer can be done, but it has to be set up.  You have to call and set up a time for their dr to call [facility]. [phone]. Pt scheduled for tomorrow. So frustrating! I will be going to lunch at 1:30.  You can call me before then with questions. Thanks, Amy ----- Message ----- From: Wyline Beady, CMA Sent: 03/15/2018  10:30 AM EDT To: Darden Dates Subject: RE: CT                                         Hey Amy I spoke to Sarita and she is ok with her having an abd [place] before the CT. I will let patient know.    ----- Message ----- From: Darden Dates Sent: 03/15/2018   7:48 AM EDT To: Wyline Beady, CMA Subject: FW: CT                                         Obinna Ehresman, This case is still denied after reconsideration due to the previous reasons even after going over chest xray and labs.  I have 3 other cases like this... Ultimately if an ultrasound has not been done prior to a CT, it will get denied. Now Anderson Malta may be able to call to see if she can do a peer to peer.  Just give her the fax I sent for you.  I'll be here today, but not tomorrow, so just let me know what she decides. Thanks, Amy   CPT Codes CPT Code Units Description CPT Status Denial Rationale Description Cpt Modifier [phone] 1 CT ABDOMEN and PELVIS; with contrast Denied Based on [employer] Imaging Guidelines, we cannot approve this request. Guidelines may support advanced imaging if a thorough workup fails to reveal the cause of weight loss. A thorough workup should include laboratory tests including a thorough endocrine evaluation, upper and lower GI endoscopy, abdominal ultrasound, and a  chest x-ray. The clinical information provided does not show proof of a thorough workup and, therefore, the request is not indicated at this time.  [id number] 1 CT ABDOMEN and PELVIS; with contrast Denied Following consideration of the original adverse decision, the initial decision is upheld with the same rationale. ----- Message ----- From: Darden Dates Sent: 03/14/2018   1:13 PM EDT To: Wyline Beady, CMA Subject: CT                                             Going to fax you a denial on CT.  They want her to have some other test done first. (no ultrasound, etc... ) All indicated on the fax. Pt not sched till the 19th so we have a little time. Thanks, Amy

## 2018-03-28 NOTE — Telephone Encounter (Signed)
Dr. Laurance Flatten,   Patients insurance will not cover her CT scan at this point they are asking that patient has more of a work up done as far as endocrinology. Patient informed. Nothing else from a GI standpoint at this time per Ellouise Newer, PA-C.    Thank you,   Tinnie Gens, [place]

## 2018-04-04 NOTE — Telephone Encounter (Signed)
Hi Desiree, Thank you for the information about this patient. Unfortunately we are just beginning on Epic yesterday so I am just seeing this message.  Was the CT scan/Ultrasound ordered from GI?  I see they are wanting more of an endocrine work up, are there any specific labs they are requesting?

## 2018-04-05 NOTE — Telephone Encounter (Addendum)
We did order the CT but insurance will not cover it, so we ordered an abd ultrasound which was unremarkable. Insurance did not indicate which labs unfortunately they were pretty vague.   Thanks for your help!

## 2018-04-16 ENCOUNTER — Ambulatory Visit (INDEPENDENT_AMBULATORY_CARE_PROVIDER_SITE_OTHER): Payer: PRIVATE HEALTH INSURANCE | Admitting: Nurse Practitioner

## 2018-04-16 VITALS — BP 138/90 | HR 74 | Temp 98.2°F | Ht 66.0 in | Wt 132.6 lb

## 2018-04-16 DIAGNOSIS — D519 Vitamin B12 deficiency anemia, unspecified: Secondary | ICD-10-CM | POA: Diagnosis not present

## 2018-04-16 MED ORDER — CYANOCOBALAMIN 1000 MCG/ML IJ SOLN
1000.0000 ug | Freq: Once | INTRAMUSCULAR | Status: AC
  Administered 2018-04-16: 1000 ug via INTRAMUSCULAR

## 2018-04-26 ENCOUNTER — Telehealth: Payer: Self-pay

## 2018-04-26 NOTE — Telephone Encounter (Signed)
I went to [facility]. They said they were not affiliated with [facility]. Asked if i had been there before. De Nandigam did my colonoscopy. Shelah Lewandowsky did the ultrasound my insurance ordered instead of CT scan. Called me next day to say all was well.   I noticed that [facility] uses the My Chart portal and [facility] uses [facility]. Should i have returned to you for further instructions. What do i do next God's blessings Wallace Cogliano  This message was in the patient portal on [facility] please advise. YRL,RMA

## 2018-04-27 ENCOUNTER — Telehealth: Payer: Self-pay

## 2018-04-27 ENCOUNTER — Encounter: Payer: Self-pay | Admitting: Nurse Practitioner

## 2018-04-27 NOTE — Telephone Encounter (Signed)
Patient called she stated she has an appointment on 11/4 with her obgyn and she wanted to know should she keep her appointment with them or just have you check everything when she comes in for her appointment.  Returned pt call and advised her to keep her appointment with the obgyn. YRL,RMA

## 2018-05-04 ENCOUNTER — Encounter: Payer: Self-pay | Admitting: Women's Health

## 2018-05-04 LAB — HM MAMMOGRAPHY: HM MAMMO: NORMAL (ref 0–4)

## 2018-05-07 ENCOUNTER — Encounter: Payer: Self-pay | Admitting: Women's Health

## 2018-05-07 ENCOUNTER — Ambulatory Visit: Payer: PRIVATE HEALTH INSURANCE | Admitting: Women's Health

## 2018-05-07 VITALS — BP 120/76 | Ht 67.0 in | Wt 133.0 lb

## 2018-05-07 DIAGNOSIS — Z13820 Encounter for screening for osteoporosis: Secondary | ICD-10-CM

## 2018-05-07 DIAGNOSIS — Z01419 Encounter for gynecological examination (general) (routine) without abnormal findings: Secondary | ICD-10-CM | POA: Diagnosis not present

## 2018-05-07 DIAGNOSIS — Z23 Encounter for immunization: Secondary | ICD-10-CM | POA: Diagnosis not present

## 2018-05-07 DIAGNOSIS — Z13220 Encounter for screening for lipoid disorders: Secondary | ICD-10-CM | POA: Diagnosis not present

## 2018-05-07 DIAGNOSIS — E559 Vitamin D deficiency, unspecified: Secondary | ICD-10-CM

## 2018-05-07 DIAGNOSIS — Z1151 Encounter for screening for human papillomavirus (HPV): Secondary | ICD-10-CM

## 2018-05-07 NOTE — Patient Instructions (Addendum)
shingrex vaccine    Health Maintenance for Postmenopausal Women Menopause is a normal process in which your reproductive ability comes to an end. This process happens gradually over a span of months to years, usually between the ages of 29 and 74. Menopause is complete when you have missed 12 consecutive menstrual periods. It is important to talk with your health care provider about some of the most common conditions that affect postmenopausal women, such as heart disease, cancer, and bone loss (osteoporosis). Adopting a healthy lifestyle and getting preventive care can help to promote your health and wellness. Those actions can also lower your chances of developing some of these common conditions. What should I know about menopause? During menopause, you may experience a number of symptoms, such as:  Moderate-to-severe hot flashes.  Night sweats.  Decrease in sex drive.  Mood swings.  Headaches.  Tiredness.  Irritability.  Memory problems.  Insomnia.  Choosing to treat or not to treat menopausal changes is an individual decision that you make with your health care provider. What should I know about hormone replacement therapy and supplements? Hormone therapy products are effective for treating symptoms that are associated with menopause, such as hot flashes and night sweats. Hormone replacement carries certain risks, especially as you become older. If you are thinking about using estrogen or estrogen with progestin treatments, discuss the benefits and risks with your health care provider. What should I know about heart disease and stroke? Heart disease, heart attack, and stroke become more likely as you age. This may be due, in part, to the hormonal changes that your body experiences during menopause. These can affect how your body processes dietary fats, triglycerides, and cholesterol. Heart attack and stroke are both medical emergencies. There are many things that you can do to help  prevent heart disease and stroke:  Have your blood pressure checked at least every 1-2 years. High blood pressure causes heart disease and increases the risk of stroke.  If you are 60-55 years old, ask your health care provider if you should take aspirin to prevent a heart attack or a stroke.  Do not use any tobacco products, including cigarettes, chewing tobacco, or electronic cigarettes. If you need help quitting, ask your health care provider.  It is important to eat a healthy diet and maintain a healthy weight. ? Be sure to include plenty of vegetables, fruits, low-fat dairy products, and lean protein. ? Avoid eating foods that are high in solid fats, added sugars, or salt (sodium).  Get regular exercise. This is one of the most important things that you can do for your health. ? Try to exercise for at least 150 minutes each week. The type of exercise that you do should increase your heart rate and make you sweat. This is known as moderate-intensity exercise. ? Try to do strengthening exercises at least twice each week. Do these in addition to the moderate-intensity exercise.  Know your numbers.Ask your health care provider to check your cholesterol and your blood glucose. Continue to have your blood tested as directed by your health care provider.  What should I know about cancer screening? There are several types of cancer. Take the following steps to reduce your risk and to catch any cancer development as early as possible. Breast Cancer  Practice breast self-awareness. ? This means understanding how your breasts normally appear and feel. ? It also means doing regular breast self-exams. Let your health care provider know about any changes, no matter how small.  you are 40 or older, have a clinician do a breast exam (clinical breast exam or CBE) every year. Depending on your age, family history, and medical history, it may be recommended that you also have a yearly breast X-ray  (mammogram).  If you have a family history of breast cancer, talk with your health care provider about genetic screening.  If you are at high risk for breast cancer, talk with your health care provider about having an MRI and a mammogram every year.  Breast cancer (BRCA) gene test is recommended for women who have family members with BRCA-related cancers. Results of the assessment will determine the need for genetic counseling and BRCA1 and for BRCA2 testing. BRCA-related cancers include these types: ? Breast. This occurs in males or females. ? Ovarian. ? Tubal. This may also be called fallopian tube cancer. ? Cancer of the abdominal or pelvic lining (peritoneal cancer). ? Prostate. ? Pancreatic.  Cervical, Uterine, and Ovarian Cancer Your health care provider may recommend that you be screened regularly for cancer of the pelvic organs. These include your ovaries, uterus, and vagina. This screening involves a pelvic exam, which includes checking for microscopic changes to the surface of your cervix (Pap test).  For women ages 21-65, health care providers may recommend a pelvic exam and a Pap test every three years. For women ages 30-65, they may recommend the Pap test and pelvic exam, combined with testing for human papilloma virus (HPV), every five years. Some types of HPV increase your risk of cervical cancer. Testing for HPV may also be done on women of any age who have unclear Pap test results.  Other health care providers may not recommend any screening for nonpregnant women who are considered low risk for pelvic cancer and have no symptoms. Ask your health care provider if a screening pelvic exam is right for you.  If you have had past treatment for cervical cancer or a condition that could lead to cancer, you need Pap tests and screening for cancer for at least 20 years after your treatment. If Pap tests have been discontinued for you, your risk factors (such as having a new sexual  partner) need to be reassessed to determine if you should start having screenings again. Some women have medical problems that increase the chance of getting cervical cancer. In these cases, your health care provider may recommend that you have screening and Pap tests more often.  If you have a family history of uterine cancer or ovarian cancer, talk with your health care provider about genetic screening.  If you have vaginal bleeding after reaching menopause, tell your health care provider.  There are currently no reliable tests available to screen for ovarian cancer.  Lung Cancer Lung cancer screening is recommended for adults 55-80 years old who are at high risk for lung cancer because of a history of smoking. A yearly low-dose CT scan of the lungs is recommended if you:  Currently smoke.  Have a history of at least 30 pack-years of smoking and you currently smoke or have quit within the past 15 years. A pack-year is smoking an average of one pack of cigarettes per day for one year.  Yearly screening should:  Continue until it has been 15 years since you quit.  Stop if you develop a health problem that would prevent you from having lung cancer treatment.  Colorectal Cancer  This type of cancer can be detected and can often be prevented.  Routine colorectal cancer screening   screening usually begins at age 50 and continues through age 75.  If you have risk factors for colon cancer, your health care provider may recommend that you be screened at an earlier age.  If you have a family history of colorectal cancer, talk with your health care provider about genetic screening.  Your health care provider may also recommend using home test kits to check for hidden blood in your stool.  A small camera at the end of a tube can be used to examine your colon directly (sigmoidoscopy or colonoscopy). This is done to check for the earliest forms of colorectal cancer.  Direct examination of the colon  should be repeated every 5-10 years until age 75. However, if early forms of precancerous polyps or small growths are found or if you have a family history or genetic risk for colorectal cancer, you may need to be screened more often.  Skin Cancer  Check your skin from head to toe regularly.  Monitor any moles. Be sure to tell your health care provider: ? About any new moles or changes in moles, especially if there is a change in a mole's shape or color. ? If you have a mole that is larger than the size of a pencil eraser.  If any of your family members has a history of skin cancer, especially at a young age, talk with your health care provider about genetic screening.  Always use sunscreen. Apply sunscreen liberally and repeatedly throughout the day.  Whenever you are outside, protect yourself by wearing long sleeves, pants, a wide-brimmed hat, and sunglasses.  What should I know about osteoporosis? Osteoporosis is a condition in which bone destruction happens more quickly than new bone creation. After menopause, you may be at an increased risk for osteoporosis. To help prevent osteoporosis or the bone fractures that can happen because of osteoporosis, the following is recommended:  If you are 19-50 years old, get at least 1,000 mg of calcium and at least 600 mg of vitamin D per day.  If you are older than age 50 but younger than age 70, get at least 1,200 mg of calcium and at least 600 mg of vitamin D per day.  If you are older than age 70, get at least 1,200 mg of calcium and at least 800 mg of vitamin D per day.  Smoking and excessive alcohol intake increase the risk of osteoporosis. Eat foods that are rich in calcium and vitamin D, and do weight-bearing exercises several times each week as directed by your health care provider. What should I know about how menopause affects my mental health? Depression may occur at any age, but it is more common as you become older. Common symptoms of  depression include:  Low or sad mood.  Changes in sleep patterns.  Changes in appetite or eating patterns.  Feeling an overall lack of motivation or enjoyment of activities that you previously enjoyed.  Frequent crying spells.  Talk with your health care provider if you think that you are experiencing depression. What should I know about immunizations? It is important that you get and maintain your immunizations. These include:  Tetanus, diphtheria, and pertussis (Tdap) booster vaccine.  Influenza every year before the flu season begins.  Pneumonia vaccine.  Shingles vaccine.  Your health care provider may also recommend other immunizations. This information is not intended to replace advice given to you by your health care provider. Make sure you discuss any questions you have with your health care   provider. Document Released: 08/12/2005 Document Revised: 01/08/2016 Document Reviewed: 03/24/2015 Elsevier Interactive Patient Education  2018 [employer].

## 2018-05-07 NOTE — Progress Notes (Signed)
Denise [name] April 05, 1951 [id number]    History:    Presents for annual exam. postmenopausal on no HRT with no bleeding.  Normal Pap and mammogram history.  2017 benign colon polyp.  2017 T score -1.6 at spine normal at hip FRAX 6.5% / 0.2%.  Same partner.  History of glaucoma, TIAs.  Past medical history, past surgical history, family history and social history were all reviewed and documented in the EPIC chart.  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  3 children.  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  ROS:  A ROS was performed and pertinent positives and negatives are included.  Exam:  Vitals:   05/07/18 0843  Weight: 133 lb (60.3 kg)  Height: 5\' 7"  (1.702 m)   Body mass index is 20.83 kg/m.   General appearance:  Normal Thyroid:  Symmetrical, normal in size, without palpable masses or nodularity. Respiratory  Auscultation:  Clear without wheezing or rhonchi Cardiovascular  Auscultation:  Regular rate, without rubs, murmurs or gallops  Edema/varicosities:  Not grossly evident Abdominal  Soft,nontender, without masses, guarding or rebound.  Liver/spleen:  No organomegaly noted  Hernia:  None appreciated  Skin  Inspection:  Grossly normal   Breasts: Examined lying and sitting.     Right: Without masses, retractions, discharge or axillary adenopathy.     Left: Without masses, retractions, discharge or axillary adenopathy. Gentitourinary   Inguinal/mons:  Normal without inguinal adenopathy  External genitalia:  Normal  BUS/Urethra/Skene's glands:  Normal  Vagina:  Normal  Cervix:  Normal  Uterus:   normal in size, shape and contour.  Midline and mobile  Adnexa/parametria:     Rt: Without masses or tenderness.   Lt: Without masses or tenderness.  Anus and perineum: Normal  Digital rectal exam: Normal sphincter tone without palpated masses or  tenderness  Assessment/Plan:  67 y.o. SBF G6, P3 for annual exam with no complaints.  Postmenopausal/no HRT/no bleeding Osteopenia without elevated FRAX Glaucoma-ophthalmologist manages  Plan: SBE's, continue annual 3D screening mammogram had one last week which was overdue.  Reviewed importance of increasing regular bearing and balance type exercise.  Home safety, fall prevention discussed.  Calcium rich diet and vitamin D 2000 daily encouraged.  Repeat DEXA.  Shingrex vaccine reviewed and encouraged.  Instructed to follow-up with primary care for Pneumovax.  Flu vaccine given today.  CBC, CMP, lipid panel, vitamin D, Pap with HR HPV typing, new screening guidelines reviewed.   [place], 8:46 AM 05/07/2018

## 2018-05-07 NOTE — Addendum Note (Signed)
Addended by: Nelva Nay on: 05/07/2018 09:50 AM   Modules accepted: Orders

## 2018-05-08 LAB — VITAMIN D 25 HYDROXY (VIT D DEFICIENCY, FRACTURES): VIT D 25 HYDROXY: 57 ng/mL (ref 30–100)

## 2018-05-08 LAB — COMPREHENSIVE METABOLIC PANEL
AG RATIO: 1.5 (calc) (ref 1.0–2.5)
ALT: 11 U/L (ref 6–29)
AST: 16 U/L (ref 10–35)
Albumin: 4 g/dL (ref 3.6–5.1)
Alkaline phosphatase (APISO): 63 U/L (ref 33–130)
BILIRUBIN TOTAL: 0.7 mg/dL (ref 0.2–1.2)
BUN/Creatinine Ratio: 14 (calc) (ref 6–22)
BUN: 15 mg/dL (ref 7–25)
CALCIUM: 9.4 mg/dL (ref 8.6–10.4)
CO2: 29 mmol/L (ref 20–32)
Chloride: 107 mmol/L (ref 98–110)
Creat: 1.05 mg/dL — ABNORMAL HIGH (ref 0.50–0.99)
Globulin: 2.6 g/dL (calc) (ref 1.9–3.7)
Glucose, Bld: 82 mg/dL (ref 65–99)
Potassium: 3.8 mmol/L (ref 3.5–5.3)
Sodium: 142 mmol/L (ref 135–146)
TOTAL PROTEIN: 6.6 g/dL (ref 6.1–8.1)

## 2018-05-08 LAB — CBC WITH DIFFERENTIAL/PLATELET
BASOS ABS: 30 {cells}/uL (ref 0–200)
Basophils Relative: 0.7 %
EOS ABS: 103 {cells}/uL (ref 15–500)
EOS PCT: 2.4 %
HEMATOCRIT: 39.3 % (ref 35.0–45.0)
HEMOGLOBIN: 13.2 g/dL (ref 11.7–15.5)
Lymphs Abs: 1668 cells/uL (ref 850–3900)
MCH: 28 pg (ref 27.0–33.0)
MCHC: 33.6 g/dL (ref 32.0–36.0)
MCV: 83.4 fL (ref 80.0–100.0)
MONOS PCT: 6.1 %
MPV: 10.5 fL (ref 7.5–12.5)
NEUTROS ABS: 2236 {cells}/uL (ref 1500–7800)
Neutrophils Relative %: 52 %
Platelets: 322 10*3/uL (ref 140–400)
RBC: 4.71 10*6/uL (ref 3.80–5.10)
RDW: 12.7 % (ref 11.0–15.0)
Total Lymphocyte: 38.8 %
WBC mixed population: 262 cells/uL (ref 200–950)
WBC: 4.3 10*3/uL (ref 3.8–10.8)

## 2018-05-08 LAB — PAP, TP IMAGING W/ HPV RNA, RFLX HPV TYPE 16,18/45: HPV DNA High Risk: NOT DETECTED

## 2018-05-08 LAB — LIPID PANEL
CHOLESTEROL: 163 mg/dL (ref <200)
HDL: 43 mg/dL — AB (ref >50)
LDL Cholesterol (Calc): 104 mg/dL (calc) — ABNORMAL HIGH
Non-HDL Cholesterol (Calc): 120 mg/dL (calc) (ref <130)
TRIGLYCERIDES: 75 mg/dL (ref <150)
Total CHOL/HDL Ratio: 3.8 (calc) (ref <5.0)

## 2018-05-24 ENCOUNTER — Encounter: Payer: Self-pay | Admitting: Nurse Practitioner

## 2018-05-24 ENCOUNTER — Ambulatory Visit (INDEPENDENT_AMBULATORY_CARE_PROVIDER_SITE_OTHER): Payer: 59 | Admitting: Nurse Practitioner

## 2018-05-24 VITALS — BP 122/76 | HR 62 | Temp 97.5°F | Ht 65.5 in | Wt 135.2 lb

## 2018-05-24 DIAGNOSIS — E559 Vitamin D deficiency, unspecified: Secondary | ICD-10-CM

## 2018-05-24 DIAGNOSIS — E538 Deficiency of other specified B group vitamins: Secondary | ICD-10-CM

## 2018-05-24 DIAGNOSIS — Z0000 Encounter for general adult medical examination without abnormal findings: Secondary | ICD-10-CM

## 2018-05-24 DIAGNOSIS — Z23 Encounter for immunization: Secondary | ICD-10-CM

## 2018-05-24 DIAGNOSIS — K5900 Constipation, unspecified: Secondary | ICD-10-CM

## 2018-05-24 DIAGNOSIS — R7303 Prediabetes: Secondary | ICD-10-CM

## 2018-05-24 LAB — POCT URINALYSIS DIPSTICK
Bilirubin, UA: NEGATIVE
GLUCOSE UA: NEGATIVE
KETONES UA: NEGATIVE
Nitrite, UA: NEGATIVE
PH UA: 7 (ref 5.0–8.0)
Protein, UA: NEGATIVE
Spec Grav, UA: 1.015 (ref 1.010–1.025)
UROBILINOGEN UA: 0.2 U/dL

## 2018-05-24 NOTE — Patient Instructions (Addendum)
Health Maintenance, Female Adopting a healthy lifestyle and getting preventive care can go a long way to promote health and wellness. Talk with your health care provider about what schedule of regular examinations is right for you. This is a good chance for you to check in with your provider about disease prevention and staying healthy. In between checkups, there are plenty of things you can do on your own. Experts have done a lot of research about which lifestyle changes and preventive measures are most likely to keep you healthy. Ask your health care provider for more information. Weight and diet Eat a healthy diet  Be sure to include plenty of vegetables, fruits, low-fat dairy products, and lean protein.  Do not eat a lot of foods high in solid fats, added sugars, or salt.  Get regular exercise. This is one of the most important things you can do for your health. ? Most adults should exercise for at least 150 minutes each week. The exercise should increase your heart rate and make you sweat (moderate-intensity exercise). ? Most adults should also do strengthening exercises at least twice a week. This is in addition to the moderate-intensity exercise.  Maintain a healthy weight  Body mass index (BMI) is a measurement that can be used to identify possible weight problems. It estimates body fat based on height and weight. Your health care provider can help determine your BMI and help you achieve or maintain a healthy weight.  For females 20 years of age and older: ? A BMI below 18.5 is considered underweight. ? A BMI of 18.5 to 24.9 is normal. ? A BMI of 25 to 29.9 is considered overweight. ? A BMI of 30 and above is considered obese.  Watch levels of cholesterol and blood lipids  You should start having your blood tested for lipids and cholesterol at 66 years of age, then have this test every 5 years.  You may need to have your cholesterol levels checked more often if: ? Your lipid or  cholesterol levels are high. ? You are older than 66 years of age. ? You are at high risk for heart disease.  Cancer screening Lung Cancer  Lung cancer screening is recommended for adults 55-80 years old who are at high risk for lung cancer because of a history of smoking.  A yearly low-dose CT scan of the lungs is recommended for people who: ? Currently smoke. ? Have quit within the past 15 years. ? Have at least a 30-pack-year history of smoking. A pack year is smoking an average of one pack of cigarettes a day for 1 year.  Yearly screening should continue until it has been 15 years since you quit.  Yearly screening should stop if you develop a health problem that would prevent you from having lung cancer treatment.  Breast Cancer  Practice breast self-awareness. This means understanding how your breasts normally appear and feel.  It also means doing regular breast self-exams. Let your health care provider know about any changes, no matter how small.  If you are in your 20s or 30s, you should have a clinical breast exam (CBE) by a health care provider every 1-3 years as part of a regular health exam.  If you are 40 or older, have a CBE every year. Also consider having a breast X-ray (mammogram) every year.  If you have a family history of breast cancer, talk to your health care provider about genetic screening.  If you are at high risk   for breast cancer, talk to your health care provider about having an MRI and a mammogram every year.  Breast cancer gene (BRCA) assessment is recommended for women who have family members with BRCA-related cancers. BRCA-related cancers include: ? Breast. ? Ovarian. ? Tubal. ? Peritoneal cancers.  Results of the assessment will determine the need for genetic counseling and BRCA1 and BRCA2 testing.  Cervical Cancer Your health care provider may recommend that you be screened regularly for cancer of the pelvic organs (ovaries, uterus, and  vagina). This screening involves a pelvic examination, including checking for microscopic changes to the surface of your cervix (Pap test). You may be encouraged to have this screening done every 3 years, beginning at age 22.  For women ages 56-65, health care providers may recommend pelvic exams and Pap testing every 3 years, or they may recommend the Pap and pelvic exam, combined with testing for human papilloma virus (HPV), every 5 years. Some types of HPV increase your risk of cervical cancer. Testing for HPV may also be done on women of any age with unclear Pap test results.  Other health care providers may not recommend any screening for nonpregnant women who are considered low risk for pelvic cancer and who do not have symptoms. Ask your health care provider if a screening pelvic exam is right for you.  If you have had past treatment for cervical cancer or a condition that could lead to cancer, you need Pap tests and screening for cancer for at least 20 years after your treatment. If Pap tests have been discontinued, your risk factors (such as having a new sexual partner) need to be reassessed to determine if screening should resume. Some women have medical problems that increase the chance of getting cervical cancer. In these cases, your health care provider may recommend more frequent screening and Pap tests.  Colorectal Cancer  This type of cancer can be detected and often prevented.  Routine colorectal cancer screening usually begins at 66 years of age and continues through 66 years of age.  Your health care provider may recommend screening at an earlier age if you have risk factors for colon cancer.  Your health care provider may also recommend using home test kits to check for hidden blood in the stool.  A small camera at the end of a tube can be used to examine your colon directly (sigmoidoscopy or colonoscopy). This is done to check for the earliest forms of colorectal  cancer.  Routine screening usually begins at age 33.  Direct examination of the colon should be repeated every 5-10 years through 66 years of age. However, you may need to be screened more often if early forms of precancerous polyps or small growths are found.  Skin Cancer  Check your skin from head to toe regularly.  Tell your health care provider about any new moles or changes in moles, especially if there is a change in a mole's shape or color.  Also tell your health care provider if you have a mole that is larger than the size of a pencil eraser.  Always use sunscreen. Apply sunscreen liberally and repeatedly throughout the day.  Protect yourself by wearing long sleeves, pants, a wide-brimmed hat, and sunglasses whenever you are outside.  Heart disease, diabetes, and high blood pressure  High blood pressure causes heart disease and increases the risk of stroke. High blood pressure is more likely to develop in: ? People who have blood pressure in the high end of  the normal range (130-139/85-89 mm Hg). ? People who are overweight or obese. ? People who are African American.  If you are 21-29 years of age, have your blood pressure checked every 3-5 years. If you are 3 years of age or older, have your blood pressure checked every year. You should have your blood pressure measured twice-once when you are at a hospital or clinic, and once when you are not at a hospital or clinic. Record the average of the two measurements. To check your blood pressure when you are not at a hospital or clinic, you can use: ? An automated blood pressure machine at a pharmacy. ? A home blood pressure monitor.  If you are between 17 years and 37 years old, ask your health care provider if you should take aspirin to prevent strokes.  Have regular diabetes screenings. This involves taking a blood sample to check your fasting blood sugar level. ? If you are at a normal weight and have a low risk for diabetes,  have this test once every three years after 66 years of age. ? If you are overweight and have a high risk for diabetes, consider being tested at a younger age or more often. Preventing infection Hepatitis B  If you have a higher risk for hepatitis B, you should be screened for this virus. You are considered at high risk for hepatitis B if: ? You were born in a country where hepatitis B is common. Ask your health care provider which countries are considered high risk. ? Your parents were born in a high-risk country, and you have not been immunized against hepatitis B (hepatitis B vaccine). ? You have HIV or AIDS. ? You use needles to inject street drugs. ? You live with someone who has hepatitis B. ? You have had sex with someone who has hepatitis B. ? You get hemodialysis treatment. ? You take certain medicines for conditions, including cancer, organ transplantation, and autoimmune conditions.  Hepatitis C  Blood testing is recommended for: ? Everyone born from [birth year] through 1964. ? Anyone with known risk factors for hepatitis C.  Sexually transmitted infections (STIs)  You should be screened for sexually transmitted infections (STIs) including gonorrhea and chlamydia if: ? You are sexually active and are younger than 66 years of age. ? You are older than 66 years of age and your health care provider tells you that you are at risk for this type of infection. ? Your sexual activity has changed since you were last screened and you are at an increased risk for chlamydia or gonorrhea. Ask your health care provider if you are at risk.  If you do not have HIV, but are at risk, it may be recommended that you take a prescription medicine daily to prevent HIV infection. This is called pre-exposure prophylaxis (PrEP). You are considered at risk if: ? You are sexually active and do not regularly use condoms or know the HIV status of your partner(s). ? You take drugs by injection. ? You are  sexually active with a partner who has HIV.  Talk with your health care provider about whether you are at high risk of being infected with HIV. If you choose to begin PrEP, you should first be tested for HIV. You should then be tested every 3 months for as long as you are taking PrEP. Pregnancy  If you are premenopausal and you may become pregnant, ask your health care provider about preconception counseling.  If you may become  pregnant, take 400 to 800 micrograms (mcg) of folic acid every day.  If you want to prevent pregnancy, talk to your health care provider about birth control (contraception). Osteoporosis and menopause  Osteoporosis is a disease in which the bones lose minerals and strength with aging. This can result in serious bone fractures. Your risk for osteoporosis can be identified using a bone density scan.  If you are 3 years of age or older, or if you are at risk for osteoporosis and fractures, ask your health care provider if you should be screened.  Ask your health care provider whether you should take a calcium or vitamin D supplement to lower your risk for osteoporosis.  Menopause may have certain physical symptoms and risks.  Hormone replacement therapy may reduce some of these symptoms and risks. Talk to your health care provider about whether hormone replacement therapy is right for you. Follow these instructions at home:  Schedule regular health, dental, and eye exams.  Stay current with your immunizations.  Do not use any tobacco products including cigarettes, chewing tobacco, or electronic cigarettes.  If you are pregnant, do not drink alcohol.  If you are breastfeeding, limit how much and how often you drink alcohol.  Limit alcohol intake to no more than 1 drink per day for nonpregnant women. One drink equals 12 ounces of beer, 5 ounces of wine, or 1 ounces of hard liquor.  Do not use street drugs.  Do not share needles.  Ask your health care  provider for help if you need support or information about quitting drugs.  Tell your health care provider if you often feel depressed.  Tell your health care provider if you have ever been abused or do not feel safe at home. This information is not intended to replace advice given to you by your health care provider. Make sure you discuss any questions you have with your health care provider. Document Released: 01/03/2011 Document Revised: 11/26/2015 Document Reviewed: 03/24/2015 Elsevier Interactive Patient Education  2018 [employer] Maintenance  Topic Date Due  . PNA vac Low Risk Adult (1 of 2 - PCV13) 06/04/2017  . MAMMOGRAM  04/05/2018  . PAP SMEAR  05/07/2021  . COLONOSCOPY  07/09/2025  . TETANUS/TDAP  05/02/2027  . INFLUENZA VACCINE  Completed  . DEXA SCAN  Completed  . Hepatitis C Screening  Completed  . HIV Screening  Completed    Constipation, Adult Constipation is when a person:  Poops (has a bowel movement) fewer times in a week than normal.  Has a hard time pooping.  Has poop that is dry, hard, or bigger than normal.  Follow these instructions at home: Eating and drinking   Eat foods that have a lot of fiber, such as: ? Fresh fruits and vegetables. ? Whole grains. ? Beans.  Eat less of foods that are high in fat, low in fiber, or overly processed, such as: ? [place] fries. ? Hamburgers. ? Cookies. ? Candy. ? Soda.  Drink enough fluid to keep your pee (urine) clear or pale yellow. General instructions  Exercise regularly or as told by your doctor.  Go to the restroom when you feel like you need to poop. Do not hold it in.  Take over-the-counter and prescription medicines only as told by your doctor. These include any fiber supplements.  Do pelvic floor retraining exercises, such as: ? Doing deep breathing while relaxing your lower belly (abdomen). ? Relaxing your pelvic floor while pooping.  Watch your  condition for any  changes.  Keep all follow-up visits as told by your doctor. This is important. Contact a doctor if:  You have pain that gets worse.  You have a fever.  You have not pooped for 4 days.  You throw up (vomit).  You are not hungry.  You lose weight.  You are bleeding from the anus.  You have thin, pencil-like poop (stool). Get help right away if:  You have a fever, and your symptoms suddenly get worse.  You leak poop or have blood in your poop.  Your belly feels hard or bigger than normal (is bloated).  You have very bad belly pain.  You feel dizzy or you faint. This information is not intended to replace advice given to you by your health care provider. Make sure you discuss any questions you have with your health care provider. Document Released: 12/07/2007 Document Revised: 01/08/2016 Document Reviewed: 12/09/2015 Elsevier Interactive Patient Education  2018 [employer].

## 2018-05-24 NOTE — Progress Notes (Addendum)
Subjective:     Patient ID: Denise [name] , female    DOB: 1951-06-07 , 67 y.o.   MRN: [id number]   Chief Complaint  Patient presents with  . Annual Exam   The patient states she uses abstinence and post menopausal status for birth control. Last LMP was No LMP recorded. Patient is postmenopausal.. Negative for Dysmenorrhea and Negative for Menorrhagia Mammogram last done 2-3 weeks at [facility] - normal per patient.  [facility].  Negative for: breast discharge, breast lump(s), breast pain and breast self exam.  Pertinent negatives include abnormal bleeding (hematology), anxiety, decreased libido, depression, difficulty falling sleep, dyspareunia, history of infertility, nocturia, sexual dysfunction, sleep disturbances, urinary incontinence, urinary urgency, vaginal discharge and vaginal itching. Diet regular.The patient states her exercise level is  3 times per week   . The patient's tobacco use is:  Social History   Tobacco Use  Smoking Status Never Smoker  Smokeless Tobacco Never Used  . She has been exposed to passive smoke. The patient's alcohol use is:  Social History   Substance and Sexual Activity  Alcohol Use Yes   Comment: Rare  . Additional information: Last pap October 2019, next one scheduled for 2020.   HPI  HPI   Past Medical History:  Diagnosis Date  . Chronic kidney disease    as a child, no present issues   . Glaucoma   . 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 Hypertension Mother   . Heart disease Mother   . Diabetes Father   . Hypertension Father   . Diabetes Sister   . Tuberculosis Maternal Grandmother   . Heart disease Sister   . Colitis Daughter   . Colon cancer Neg Hx   . Colon polyps Neg Hx   . Esophageal cancer Neg Hx   . Rectal cancer Neg Hx   . Stomach cancer Neg Hx      Current Outpatient Medications:  .  Cholecalciferol (VITAMIN D PO), Take by mouth., Disp: , Rfl:  .  timolol (BETIMOL)  0.25 % ophthalmic solution, 1-2 drops 2 (two) times daily., Disp: , Rfl:  .  Travoprost, BAK Free, (TRAVATAN) 0.004 % SOLN ophthalmic solution, 1 drop at bedtime., Disp: , Rfl:    No Known Allergies   Review of Systems  Constitutional: Positive for fatigue.  HENT: Negative.   Eyes: Negative.   Respiratory: Negative.   Cardiovascular: Negative.   Gastrointestinal: Positive for constipation.  Endocrine: Negative.   Genitourinary: Negative.   Musculoskeletal: Negative.   Skin: Negative.   Allergic/Immunologic: Negative.   Neurological: Negative.   Hematological: Negative.   Psychiatric/Behavioral: Negative.      Today's Vitals   05/24/18 1129  BP: 122/76  Pulse: 62  Temp: (!) 97.5 F (36.4 C)  TempSrc: Oral  SpO2: 93%  Weight: 135 lb 3.2 oz (61.3 kg)  Height: 5' 5.5" (1.664 m)  PainSc: 0-No pain   Body mass index is 22.16 kg/m.   Objective:  Physical Exam Vitals signs reviewed.  Constitutional:      Appearance: She is well-developed.  HENT:     Head: Normocephalic and atraumatic.     Right Ear: Hearing, tympanic membrane, ear canal and external ear normal.     Left Ear: Hearing, tympanic membrane, ear canal and external ear normal.     Nose: Nose normal.     Mouth/Throat:     Mouth: Mucous membranes  are moist.  Eyes:     General: Lids are normal.     Conjunctiva/sclera: Conjunctivae normal.     Pupils: Pupils are equal, round, and reactive to light.     Funduscopic exam:    Right eye: No papilledema.        Left eye: No papilledema.  Neck:     Musculoskeletal: Full passive range of motion without pain, normal range of motion and neck supple.     Thyroid: No thyroid mass.     Vascular: No carotid bruit.  Cardiovascular:     Rate and Rhythm: Normal rate and regular rhythm.     Pulses: Normal pulses.     Heart sounds: Normal heart sounds. No murmur.  Pulmonary:     Effort: Pulmonary effort is normal. No respiratory distress.     Breath sounds: Normal breath  sounds.  Abdominal:     General: Abdomen is flat. Bowel sounds are normal.     Palpations: Abdomen is soft.  Musculoskeletal: Normal range of motion.  Skin:    General: Skin is warm and dry.     Capillary Refill: Capillary refill takes less than 2 seconds.  Neurological:     General: No focal deficit present.     Mental Status: She is alert and oriented to person, place, and time.     Cranial Nerves: No cranial nerve deficit.     Sensory: No sensory deficit.  Psychiatric:        Behavior: Behavior normal.        Thought Content: Thought content normal.        Judgment: Judgment normal.         Assessment And Plan:     1. Encounter for general adult medical examination w/o abnormal findings . Behavior modifications discussed and diet history reviewed.   . Pt will continue to exercise regularly and modify diet with low GI, plant based foods and decrease intake of processed foods.  . Recommend intake of daily multivitamin, Vitamin D, and calcium.  . Recommend mammogram (up to date) and colonoscopy (up to date) for preventive screenings, as well as recommend immunizations that include influenza, TDAP, and Shingles  - POCT Urinalysis Dipstick (81002)  2. Encounter for immunization  Pneumonia 23 injection given in office  Discussed will get second one in 1 year  Also sent Rx for Shingrix to pharmacy advised to wait at least a month and do not go when feeling sick.  3. Vitamin B12 deficiency  Treated with vitamin b12 injections  Feels better  Will recheck levels today and consider continued injections  4. Vitamin D deficiency  Will recheck levels  5. Prediabetes  Chronic  No medications at this time.   6. Constipation, unspecified constipation type  Encouraged to increase water intake and stool softner as needed       Minette Brine, FNP

## 2018-05-25 LAB — BMP8+EGFR
BUN/Creatinine Ratio: 15 (ref 12–28)
BUN: 17 mg/dL (ref 8–27)
CO2: 23 mmol/L (ref 20–29)
Calcium: 9.5 mg/dL (ref 8.7–10.3)
Chloride: 105 mmol/L (ref 96–106)
Creatinine, Ser: 1.17 mg/dL — ABNORMAL HIGH (ref 0.57–1.00)
GFR calc Af Amer: 57 mL/min/{1.73_m2} — ABNORMAL LOW (ref >59)
GFR calc non Af Amer: 49 mL/min/{1.73_m2} — ABNORMAL LOW (ref >59)
GLUCOSE: 98 mg/dL (ref 65–99)
POTASSIUM: 4.3 mmol/L (ref 3.5–5.2)
Sodium: 145 mmol/L — ABNORMAL HIGH (ref 134–144)

## 2018-05-25 LAB — HEMOGLOBIN A1C
ESTIMATED AVERAGE GLUCOSE: 126 mg/dL
Hgb A1c MFr Bld: 6 % — ABNORMAL HIGH (ref 4.8–5.6)

## 2018-05-25 LAB — VITAMIN D 25 HYDROXY (VIT D DEFICIENCY, FRACTURES): Vit D, 25-Hydroxy: 66.7 ng/mL (ref 30.0–100.0)

## 2018-05-25 LAB — VITAMIN B12: Vitamin B-12: 370 pg/mL (ref 232–1245)

## 2018-05-27 ENCOUNTER — Encounter: Payer: Self-pay | Admitting: Nurse Practitioner

## 2018-05-28 ENCOUNTER — Encounter: Payer: Self-pay | Admitting: Nurse Practitioner

## 2018-06-05 ENCOUNTER — Encounter: Payer: Self-pay | Admitting: Nurse Practitioner

## 2018-06-07 ENCOUNTER — Encounter: Payer: Self-pay | Admitting: Nurse Practitioner

## 2018-06-21 ENCOUNTER — Other Ambulatory Visit: Payer: Self-pay | Admitting: Gynecology

## 2018-06-21 ENCOUNTER — Ambulatory Visit: Payer: 59

## 2018-06-21 ENCOUNTER — Encounter: Payer: Self-pay | Admitting: Nurse Practitioner

## 2018-06-21 ENCOUNTER — Encounter: Payer: Self-pay | Admitting: Gynecology

## 2018-06-21 DIAGNOSIS — M8589 Other specified disorders of bone density and structure, multiple sites: Secondary | ICD-10-CM

## 2018-06-21 DIAGNOSIS — Z780 Asymptomatic menopausal state: Secondary | ICD-10-CM

## 2018-06-21 DIAGNOSIS — Z13820 Encounter for screening for osteoporosis: Secondary | ICD-10-CM

## 2018-06-24 MED ORDER — ZOSTER VAC RECOMB ADJUVANTED 50 MCG/0.5ML IM SUSR: 0.5000 mL | Freq: Once | INTRAMUSCULAR | 0 refills | Status: AC

## 2018-08-04 ENCOUNTER — Other Ambulatory Visit: Payer: Self-pay | Admitting: Nurse Practitioner

## 2018-08-08 ENCOUNTER — Encounter: Payer: Self-pay | Admitting: Nurse Practitioner

## 2018-08-08 NOTE — Telephone Encounter (Signed)
Call patient and schedule weekly B12 injections for 3 weeks then once monthly x 2 months.

## 2018-08-17 ENCOUNTER — Ambulatory Visit (INDEPENDENT_AMBULATORY_CARE_PROVIDER_SITE_OTHER): Payer: 59 | Admitting: Nurse Practitioner

## 2018-08-17 VITALS — BP 130/80 | HR 69 | Temp 98.1°F | Ht 64.25 in | Wt 135.4 lb

## 2018-08-17 DIAGNOSIS — E538 Deficiency of other specified B group vitamins: Secondary | ICD-10-CM | POA: Diagnosis not present

## 2018-08-17 DIAGNOSIS — D519 Vitamin B12 deficiency anemia, unspecified: Secondary | ICD-10-CM | POA: Diagnosis not present

## 2018-08-17 MED ORDER — CYANOCOBALAMIN 1000 MCG/ML IJ SOLN
1000.0000 ug | Freq: Once | INTRAMUSCULAR | Status: AC
  Administered 2018-08-17: 1000 ug via INTRAMUSCULAR

## 2018-08-17 NOTE — Progress Notes (Signed)
  Subjective:     Patient ID: Denise [name] , female    DOB: Sep 07, 1950 , 68 y.o.   MRN: [id number]   Chief Complaint  Patient presents with  . B12 Injection    patient presents today to have her first b12 injection    HPI  She is here today for her 1st Vitamin B12 injection    Past Medical History:  Diagnosis Date  . Chronic kidney disease    as a child, no present issues   . Glaucoma   . Osteopenia 06/2018   T score -1.9 FRAX 3% / 0.2%  . 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 Hypertension Mother   . Heart disease Mother   . Diabetes Father   . Hypertension Father   . Diabetes Sister   . Tuberculosis Maternal Grandmother   . Heart disease Sister   . Colitis Daughter   . Colon cancer Neg Hx   . Colon polyps Neg Hx   . Esophageal cancer Neg Hx   . Rectal cancer Neg Hx   . Stomach cancer Neg Hx      Current Outpatient Medications:  .  cyanocobalamin 100 MCG tablet, Take 100 mcg by mouth daily., Disp: , Rfl:  .  timolol (BETIMOL) 0.25 % ophthalmic solution, 1-2 drops 2 (two) times daily., Disp: , Rfl:  .  Travoprost, BAK Free, (TRAVATAN) 0.004 % SOLN ophthalmic solution, 1 drop at bedtime., Disp: , Rfl:  .  Vitamin D, Ergocalciferol, (DRISDOL) 1.25 MG (50000 UT) CAPS capsule, TAKE 1 CAPSULE BY MOUTH 2 TIMES EVERY WEEK, Disp: 24 capsule, Rfl: 1   No Known Allergies   Review of Systems  Constitutional: Negative for fatigue.  Respiratory: Negative.  Negative for cough.   Cardiovascular: Negative.  Negative for chest pain, palpitations and leg swelling.  Endocrine: Negative for polydipsia, polyphagia and polyuria.  Musculoskeletal: Negative.      Today's Vitals   08/17/18 1439  BP: 130/80  Pulse: 69  Temp: 98.1 F (36.7 C)  TempSrc: Oral  SpO2: 97%  Weight: 135 lb 6.4 oz (61.4 kg)  Height: 5' 4.25" (1.632 m)   Body mass index is 23.06 kg/m.   Objective:  Physical Exam Vitals signs reviewed.  Constitutional:       Appearance: Normal appearance.  Cardiovascular:     Rate and Rhythm: Normal rate and regular rhythm.     Pulses: Normal pulses.     Heart sounds: Normal heart sounds. No murmur.  Pulmonary:     Effort: Pulmonary effort is normal.     Breath sounds: Normal breath sounds.  Neurological:     Mental Status: She is alert.         Assessment And Plan:     1. Vitamin B12 deficiency  Continues to have a sub therapeutic vitmain B12 at 370  Will do the vitamin b12 injections once a week for 3 weeks then once per month until April will recheck levels at that time.    Minette Brine, FNP

## 2018-08-17 NOTE — Patient Instructions (Signed)
Vitamin B12 Deficiency Vitamin B12 deficiency occurs when the body does not have enough vitamin B12. Vitamin B12 is an important vitamin. The body needs vitamin B12:  To make red blood cells.  To make DNA. This is the genetic material inside cells.  To help the nerves work properly so they can carry messages from the brain to the body. Vitamin B12 deficiency can cause various health problems, such as a low red blood cell count (anemia) or nerve damage. What are the causes? This condition may be caused by:  Not eating enough foods that contain vitamin B12.  Not having enough stomach acid and digestive fluids to properly absorb vitamin B12 from the food that you eat.  Certain digestive system diseases that make it hard to absorb vitamin B12. These diseases include Crohn disease, chronic pancreatitis, and cystic fibrosis.  Pernicious anemia. This is a condition in which the body does not make enough of a protein (intrinsic factor), resulting in too few red blood cells.  Having a surgery in which part of the stomach or small intestine is removed.  Taking certain medicines that make it hard for the body to absorb vitamin B12. These medicines include: ? Heartburn medicine (antacids and proton pump inhibitors). ? An antibiotic medicine called neomycin. ? Some medicines that are used to treat diabetes, tuberculosis, gout, or high cholesterol. What increases the risk? The following factors may make you more likely to develop a B12 deficiency:  Being older than age 69.  Eating a vegetarian or vegan diet, especially while you are pregnant.  Eating a poor diet while you are pregnant.  Taking certain drugs.  Having alcoholism. What are the signs or symptoms? In some cases, there are no symptoms of this condition. If the condition leads to anemia or nerve damage, various symptoms can occur, such as:  Weakness.  Fatigue.  Loss of appetite.  Weight loss.  Numbness or tingling in your  hands and feet.  Redness and burning of the tongue.  Confusion or memory problems.  Depression.  Sensory problems, such as color blindness, ringing in the ears, or loss of taste.  Diarrhea or constipation.  Trouble walking. If anemia is severe, symptoms can include:  Shortness of breath.  Dizziness.  Rapid heart rate (tachycardia).  How is this diagnosed? This condition may be diagnosed with a blood test to measure the level of vitamin B12 in your blood. You may have other tests to help find the cause of your vitamin B12 deficiency. These tests may include:  A complete blood count (CBC). This is a group of tests that measure certain characteristics of blood cells.  A blood test to measure intrinsic factor.  An endoscopy. In this procedure, a thin tube with a camera on the end is used to look into your stomach or intestines. How is this treated? Treatment for this condition depends on the cause. Common treatment options include:  Changing your eating and drinking habits, such as: ? Eating more foods that contain vitamin B12. ? Drinking less alcohol or no alcohol.  Taking vitamin B12 supplements. Your health care provider will tell you which dosage is best for you.  Getting vitamin B12 injections. Follow these instructions at home:  Take supplements only as told by your health care provider. Follow the directions carefully.  Get any injections that are prescribed by your health care provider.  Do not miss your appointments.  Eat lots of healthy foods that contain vitamin B12. Ask your health care provider if  Follow these instructions at home:   Take supplements only as told by your health care provider. Follow the directions carefully.   Get any injections that are prescribed by your health care provider.  Do not miss your appointments.   Eat lots of healthy foods that contain vitamin B12. Ask your health care provider if you should work with a dietitian. Foods that contain vitamin B12 include:  ? Meat.  ? Meat from birds (poultry).  ? Fish.  ? Eggs.  ? Cereal and dairy products that are fortified. This means that vitamin B12 has been added to the food. Check the label on the package to see if the food is fortified.   Do not abuse alcohol.   Keep all follow-up visits as told by your  health care provider. This is important.  Contact a health care provider if:   Your symptoms come back.  Get help right away if:   You develop shortness of breath.   You have chest pain.   You become dizzy or you lose consciousness.  This information is not intended to replace advice given to you by your health care provider. Make sure you discuss any questions you have with your health care provider.  Document Released: 09/12/2011 Document Revised: 12/02/2015 Document Reviewed: 11/05/2014  Elsevier Interactive Patient Education  2019 [employer]vier Inc.

## 2018-08-24 ENCOUNTER — Encounter: Payer: Self-pay | Admitting: Nurse Practitioner

## 2018-08-24 ENCOUNTER — Ambulatory Visit (INDEPENDENT_AMBULATORY_CARE_PROVIDER_SITE_OTHER): Payer: 59 | Admitting: Nurse Practitioner

## 2018-08-24 ENCOUNTER — Other Ambulatory Visit: Payer: Self-pay

## 2018-08-24 VITALS — BP 122/66 | HR 73 | Temp 98.2°F | Ht 66.0 in | Wt 136.8 lb

## 2018-08-24 DIAGNOSIS — E538 Deficiency of other specified B group vitamins: Secondary | ICD-10-CM

## 2018-08-24 DIAGNOSIS — R350 Frequency of micturition: Secondary | ICD-10-CM | POA: Diagnosis not present

## 2018-08-24 MED ORDER — CYANOCOBALAMIN 1000 MCG/ML IJ SOLN
1000.0000 ug | Freq: Once | INTRAMUSCULAR | Status: AC
  Administered 2018-08-24: 1000 ug via INTRAMUSCULAR

## 2018-08-24 NOTE — Progress Notes (Addendum)
Subjective:     Patient ID: Denise [name] , female    DOB: 17-Sep-1950 , 67 y.o.   MRN: [id number]   Chief Complaint  Patient presents with  . B12 Injection    HPI  Here for Vitamin B12 injection - she feels her brain skills are sharpening.  She is having  Urinary Frequency   This is a new problem. The current episode started in the past 7 days. The problem occurs intermittently. The problem has been unchanged. She is not sexually active. There is no history of pyelonephritis. Associated symptoms include frequency. She has tried nothing for the symptoms.     Past Medical History:  Diagnosis Date  . Chronic kidney disease    as a child, no present issues   . Glaucoma   . Osteopenia 06/2018   T score -1.9 FRAX 3% / 0.2%  . 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 Hypertension Mother   . Heart disease Mother   . Diabetes Father   . Hypertension Father   . Diabetes Sister   . Tuberculosis Maternal Grandmother   . Heart disease Sister   . Colitis Daughter   . Colon cancer Neg Hx   . Colon polyps Neg Hx   . Esophageal cancer Neg Hx   . Rectal cancer Neg Hx   . Stomach cancer Neg Hx      Current Outpatient Medications:  .  timolol (BETIMOL) 0.25 % ophthalmic solution, 1-2 drops 2 (two) times daily., Disp: , Rfl:  .  Travoprost, BAK Free, (TRAVATAN) 0.004 % SOLN ophthalmic solution, 1 drop at bedtime., Disp: , Rfl:  .  Vitamin D, Ergocalciferol, (DRISDOL) 1.25 MG (50000 UT) CAPS capsule, TAKE 1 CAPSULE BY MOUTH 2 TIMES EVERY WEEK, Disp: 24 capsule, Rfl: 1 .  cyanocobalamin 100 MCG tablet, Take 100 mcg by mouth daily., Disp: , Rfl:    No Known Allergies   Review of Systems  Constitutional: Negative for fatigue.  HENT: Negative for sore throat.   Respiratory: Negative for cough.   Cardiovascular: Negative.  Negative for chest pain, palpitations and leg swelling.  Genitourinary: Positive for difficulty urinating and frequency.      Today's Vitals   08/24/18 1435  BP: 122/66  Pulse: 73  Temp: 98.2 F (36.8 C)  TempSrc: Oral  SpO2: 95%  Weight: 136 lb 12.8 oz (62.1 kg)  Height: 5\' 6"  (1.676 m)   Body mass index is 22.08 kg/m.   Objective:  Physical Exam Vitals signs reviewed.  Constitutional:      Appearance: Normal appearance. She is well-developed.  Eyes:     Pupils: Pupils are equal, round, and reactive to light.  Cardiovascular:     Rate and Rhythm: Normal rate and regular rhythm.     Pulses: Normal pulses.     Heart sounds: Normal heart sounds. No murmur.  Pulmonary:     Effort: Pulmonary effort is normal.     Breath sounds: Normal breath sounds.  Skin:    General: Skin is warm and dry.     Capillary Refill: Capillary refill takes less than 2 seconds.  Neurological:     General: No focal deficit present.     Mental Status: She is alert and oriented to person, place, and time.     Cranial Nerves: No cranial nerve deficit.  Psychiatric:        Mood and Affect: Mood normal.  Behavior: Behavior normal.        Thought Content: Thought content normal.        Judgment: Judgment normal.         Assessment And Plan:     1. Vitamin B12 deficiency  2nd vitamin B12 injection today, she will have her third in one week then monthly  Feels has helped with her memory and circulation of her hands - cyanocobalamin ((VITAMIN B-12)) injection 1,000 mcg    2. Urinary frequency  Encouraged to increase water intake.   If symptoms worsen will check urinalysis    Minette Brine, FNP

## 2018-08-24 NOTE — Progress Notes (Signed)
b12

## 2018-08-28 ENCOUNTER — Encounter: Payer: Self-pay | Admitting: Nurse Practitioner

## 2018-08-31 ENCOUNTER — Ambulatory Visit: Payer: 59 | Admitting: Nurse Practitioner

## 2018-09-03 ENCOUNTER — Ambulatory Visit: Payer: 59 | Admitting: Nurse Practitioner

## 2018-09-05 ENCOUNTER — Encounter: Payer: Self-pay | Admitting: Nurse Practitioner

## 2018-09-05 ENCOUNTER — Ambulatory Visit (INDEPENDENT_AMBULATORY_CARE_PROVIDER_SITE_OTHER): Payer: 59 | Admitting: Nurse Practitioner

## 2018-09-05 VITALS — BP 100/60 | HR 61 | Ht 66.0 in | Wt 134.2 lb

## 2018-09-05 DIAGNOSIS — D519 Vitamin B12 deficiency anemia, unspecified: Secondary | ICD-10-CM | POA: Diagnosis not present

## 2018-09-05 MED ORDER — CYANOCOBALAMIN 1000 MCG/ML IJ SOLN
1000.0000 ug | Freq: Once | INTRAMUSCULAR | Status: AC
  Administered 2018-09-05: 1000 ug via INTRAMUSCULAR

## 2018-09-15 ENCOUNTER — Encounter: Payer: Self-pay | Admitting: Nurse Practitioner

## 2018-09-25 ENCOUNTER — Encounter: Payer: Self-pay | Admitting: Nurse Practitioner

## 2018-09-25 NOTE — Progress Notes (Signed)
Patient was not seen by me today  Subjective:     Patient ID: Denise [name] , female    DOB: 1950-11-26 , 68 y.o.   MRN: [id number]   Chief Complaint  Patient presents with  . vitamin b12    HPI  Here for vitamin B12 injection, she is doing well and feels her memory is better    Past Medical History:  Diagnosis Date  . Chronic kidney disease    as a child, no present issues   . Glaucoma   . Osteopenia 06/2018   T score -1.9 FRAX 3% / 0.2%  . 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 Hypertension Mother   . Heart disease Mother   . Diabetes Father   . Hypertension Father   . Diabetes Sister   . Tuberculosis Maternal Grandmother   . Heart disease Sister   . Colitis Daughter   . Colon cancer Neg Hx   . Colon polyps Neg Hx   . Esophageal cancer Neg Hx   . Rectal cancer Neg Hx   . Stomach cancer Neg Hx      Current Outpatient Medications:  .  timolol (BETIMOL) 0.25 % ophthalmic solution, 1-2 drops 2 (two) times daily., Disp: , Rfl:  .  Travoprost, BAK Free, (TRAVATAN) 0.004 % SOLN ophthalmic solution, 1 drop at bedtime., Disp: , Rfl:  .  Vitamin D, Ergocalciferol, (DRISDOL) 1.25 MG (50000 UT) CAPS capsule, TAKE 1 CAPSULE BY MOUTH 2 TIMES EVERY WEEK, Disp: 24 capsule, Rfl: 1   No Known Allergies   Review of Systems  Constitutional: Negative for fatigue.  HENT: Negative for sore throat.   Respiratory: Negative for cough.   Cardiovascular: Negative.  Negative for chest pain, palpitations and leg swelling.  Genitourinary: Negative for difficulty urinating.  Neurological: Negative for dizziness.  Psychiatric/Behavioral: Negative.      Today's Vitals   09/05/18 1428  BP: 100/60  Pulse: 61  SpO2: 95%  Weight: 134 lb 3.2 oz (60.9 kg)  Height: 5\' 6"  (1.676 m)   Body mass index is 21.66 kg/m.   Objective:  Physical Exam Vitals signs reviewed.  Constitutional:      Appearance: Normal appearance.  Cardiovascular:   Rate and Rhythm: Normal rate and regular rhythm.     Pulses: Normal pulses.     Heart sounds: Normal heart sounds. No murmur.  Pulmonary:     Effort: Pulmonary effort is normal.     Breath sounds: Normal breath sounds.  Skin:    General: Skin is warm.     Capillary Refill: Capillary refill takes less than 2 seconds.  Neurological:     Mental Status: She is alert.         Assessment And Plan:     1. Anemia due to vitamin B12 deficiency, unspecified B12 deficiency type  Doing well  Will check her vitamin b12 at next visit - cyanocobalamin ((VITAMIN B-12)) injection 1,000 mcg      Minette Brine, FNP

## 2018-10-01 ENCOUNTER — Other Ambulatory Visit: Payer: Self-pay | Admitting: Nurse Practitioner

## 2018-10-01 ENCOUNTER — Other Ambulatory Visit: Payer: Self-pay

## 2018-10-01 ENCOUNTER — Other Ambulatory Visit: Payer: PRIVATE HEALTH INSURANCE

## 2018-10-01 DIAGNOSIS — E538 Deficiency of other specified B group vitamins: Secondary | ICD-10-CM

## 2018-10-01 DIAGNOSIS — R350 Frequency of micturition: Secondary | ICD-10-CM | POA: Insufficient documentation

## 2018-10-02 LAB — VITAMIN B12: Vitamin B-12: 555 pg/mL (ref 232–1245)

## 2018-11-22 ENCOUNTER — Ambulatory Visit: Payer: 59 | Admitting: Nurse Practitioner

## 2018-12-03 ENCOUNTER — Encounter: Payer: Self-pay | Admitting: Nurse Practitioner

## 2018-12-03 ENCOUNTER — Other Ambulatory Visit: Payer: Self-pay

## 2018-12-03 ENCOUNTER — Ambulatory Visit: Payer: PRIVATE HEALTH INSURANCE | Admitting: Nurse Practitioner

## 2018-12-03 VITALS — BP 110/78 | HR 63 | Temp 98.5°F | Ht 66.0 in | Wt 130.2 lb

## 2018-12-03 DIAGNOSIS — E559 Vitamin D deficiency, unspecified: Secondary | ICD-10-CM | POA: Diagnosis not present

## 2018-12-03 DIAGNOSIS — R7303 Prediabetes: Secondary | ICD-10-CM

## 2018-12-03 DIAGNOSIS — R5383 Other fatigue: Secondary | ICD-10-CM | POA: Insufficient documentation

## 2018-12-03 DIAGNOSIS — R634 Abnormal weight loss: Secondary | ICD-10-CM

## 2018-12-03 DIAGNOSIS — D519 Vitamin B12 deficiency anemia, unspecified: Secondary | ICD-10-CM

## 2018-12-03 NOTE — Progress Notes (Signed)
Subjective:     Patient ID: Denise [name] , female    DOB: [birth year] , 68 y.o.   MRN: [id number]   Chief Complaint  Patient presents with  . Anemia    patient presents today for a recheck on her Vitamin B12    HPI  She is taking over the counter vitamin B 12.  Feels like   Anemia  Presents for follow-up (vitamin b deficiency) visit. Symptoms include malaise/fatigue. There has been no abdominal pain, anorexia or palpitations. There are no compliance problems.  Side effects of medications include fatigue.     Past Medical History:  Diagnosis Date  . Chronic kidney disease    as a child, no present issues   . Glaucoma   . Osteopenia 06/2018   T score -1.9 FRAX 3% / 0.2%  . 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 Hypertension Mother   . Heart disease Mother   . Diabetes Father   . Hypertension Father   . Diabetes Sister   . Tuberculosis Maternal Grandmother   . Heart disease Sister   . Colitis Daughter   . Colon cancer Neg Hx   . Colon polyps Neg Hx   . Esophageal cancer Neg Hx   . Rectal cancer Neg Hx   . Stomach cancer Neg Hx      Current Outpatient Medications:  .  Cholecalciferol (VITAMIN D3) 10 MCG (400 UNIT) CAPS, Take 1 capsule by mouth daily. Pt unaware of dose, Disp: , Rfl:  .  timolol (BETIMOL) 0.25 % ophthalmic solution, 1-2 drops daily. , Disp: , Rfl:  .  Travoprost, BAK Free, (TRAVATAN) 0.004 % SOLN ophthalmic solution, 1 drop at bedtime., Disp: , Rfl:  .  Vitamin D, Ergocalciferol, (DRISDOL) 1.25 MG (50000 UT) CAPS capsule, TAKE 1 CAPSULE BY MOUTH 2 TIMES EVERY WEEK (Patient not taking: Reported on 12/03/2018), Disp: 24 capsule, Rfl: 1   No Known Allergies   Review of Systems  Constitutional: Positive for fatigue and malaise/fatigue.  Respiratory: Negative for cough and wheezing.   Cardiovascular: Negative for chest pain, palpitations and leg swelling.  Gastrointestinal: Negative for abdominal pain and  anorexia.  Endocrine: Negative for polydipsia, polyphagia and polyuria.  Neurological: Negative for dizziness and headaches.     Today's Vitals   12/03/18 1140  BP: 110/78  Pulse: 63  Temp: 98.5 F (36.9 C)  TempSrc: Oral  Weight: 130 lb 3.2 oz (59.1 kg)  Height: '5\' 6"'  (1.676 m)  PainSc: 0-No pain   Body mass index is 21.01 kg/m.   Objective:  Physical Exam Vitals signs reviewed.  Constitutional:      Appearance: Normal appearance.  Cardiovascular:     Rate and Rhythm: Normal rate and regular rhythm.     Pulses: Normal pulses.     Heart sounds: Normal heart sounds.  Skin:    Capillary Refill: Capillary refill takes less than 2 seconds.  Neurological:     General: No focal deficit present.     Mental Status: She is alert and oriented to person, place, and time.  Psychiatric:        Mood and Affect: Mood normal.         Assessment And Plan:   1. Anemia due to vitamin B12 deficiency, unspecified B12 deficiency type  Had improved at last visit  She is now having the fatigue again   Taking an over the counter supplement - Vitamin  B12  2. Vitamin D deficiency  Will check vitamin D level and supplement as needed.     Also encouraged to spend 15 minutes in the sun daily.  - Vitamin D (25 hydroxy)  3. Prediabetes  Chronic, controlled  Continue with current medications  Encouraged to limit intake of sugary foods and drinks  Encouraged to increase physical activity to 150 minutes per week as tolerated - BMP8+eGFR  4. Fatigue, unspecified type  Reoccurring, will recheck her vitamin B12 as this could be the cause   Minette Brine, FNP    THE PATIENT IS ENCOURAGED TO PRACTICE SOCIAL DISTANCING DUE TO THE COVID-19 PANDEMIC.

## 2018-12-03 NOTE — Patient Instructions (Signed)
Vitamin B12 Deficiency Vitamin B12 deficiency means that your body is not getting enough vitamin B12. Your body needs vitamin B12 for important bodily functions. If you do not have enough vitamin B12 in your body, you can have health problems. Follow these instructions at home:  Take supplements only as told by your doctor. Follow the directions carefully.  Get any shots (injections) as told by your doctor. Do not miss your visits to the doctor.  Eat lots of healthy foods that contain vitamin B12. Ask your doctor if you should work with someone who is trained in how food affects health (dietitian). Foods that contain vitamin B12 include: ? Meat. ? Meat from birds (poultry). ? Fish. ? Eggs. ? Cereal and dairy products that are fortified. This means that vitamin B12 has been added to the food. Check the label on the package to see if the food is fortified.  Do not drink too much (do not abuse) alcohol.  Keep all follow-up visits as told by your doctor. This is important. Contact a doctor if:  Your symptoms come back. Get help right away if:  You have trouble breathing.  You have chest pain.  You get dizzy.  You pass out (lose consciousness). This information is not intended to replace advice given to you by your health care provider. Make sure you discuss any questions you have with your health care provider. Document Released: 06/09/2011 Document Revised: 11/26/2015 Document Reviewed: 11/05/2014 Elsevier Interactive Patient Education  2019 Elsevier Inc.  

## 2018-12-04 LAB — HEMOGLOBIN A1C
Est. average glucose Bld gHb Est-mCnc: 117 mg/dL
Hgb A1c MFr Bld: 5.7 % — ABNORMAL HIGH (ref 4.8–5.6)

## 2018-12-04 LAB — BMP8+EGFR
BUN/Creatinine Ratio: 15 (ref 12–28)
BUN: 14 mg/dL (ref 8–27)
CO2: 20 mmol/L (ref 20–29)
Calcium: 10.1 mg/dL (ref 8.7–10.3)
Chloride: 108 mmol/L — ABNORMAL HIGH (ref 96–106)
Creatinine, Ser: 0.91 mg/dL (ref 0.57–1.00)
GFR calc Af Amer: 76 mL/min/{1.73_m2} (ref >59)
GFR calc non Af Amer: 66 mL/min/{1.73_m2} (ref >59)
Glucose: 103 mg/dL — ABNORMAL HIGH (ref 65–99)
Potassium: 5 mmol/L (ref 3.5–5.2)
Sodium: 145 mmol/L — ABNORMAL HIGH (ref 134–144)

## 2018-12-04 LAB — VITAMIN B12: Vitamin B-12: 409 pg/mL (ref 232–1245)

## 2018-12-04 LAB — VITAMIN D 25 HYDROXY (VIT D DEFICIENCY, FRACTURES): Vit D, 25-Hydroxy: 33.5 ng/mL (ref 30.0–100.0)

## 2019-01-09 ENCOUNTER — Encounter: Payer: 59 | Attending: Nurse Practitioner | Admitting: Registered"

## 2019-01-09 ENCOUNTER — Other Ambulatory Visit: Payer: Self-pay

## 2019-01-09 DIAGNOSIS — R634 Abnormal weight loss: Secondary | ICD-10-CM | POA: Diagnosis present

## 2019-01-09 NOTE — Patient Instructions (Addendum)
[email or web address] for evaluating supplements  Ideas for higher calorie foods while also keeping carbohydrates under 40 g: Use higher fat dressings, gravies, sauces. Chicken nuggets in ranch dressing When having the trout dinner, have side that will add calories, butter on your potatoes, extra sauce if it comes with the fish. Continue using whole milk on your cereal. Look for whole milk yogurt product Perfect Protein bar (peanut butter) found in the refrigerated section may be a convenient way to get over 300 calories Nut butters  Boost High protein (not MAX) or similar product

## 2019-01-09 NOTE — Progress Notes (Signed)
Medical Nutrition Therapy:  Appt start time: 4076 end time:  1200.   Assessment:  Primary concerns today: Concerned about weight loss, would like to get up to at least 140 lb.   Wt Readings from Last 3 Encounters:  01/09/19 132 lb 11.2 oz (60.2 kg)  12/03/18 130 lb 3.2 oz (59.1 kg)  09/05/18 134 lb 3.2 oz (60.9 kg)   Pt states because of some stress she was experiencing she lost a significant amount of weight, dropped from 150 lbs to 130 lbs. Pt states she has a good friend that has helped her de-stress. 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  Pt states sometimes she forgets to eat, gets busy in between meals. Pt states she still has an appetite because when she does sit down for a meal doesn't have an issue eating. Pt states she feels she needs some better habits, lives alone.  RD observed clavicle protrusion, appears malnourished. Pt states she believes this has been a physical change with her recent weight loss.  Sleep: 6-7 hours. sometimes busy or watching TV. Usually goes to sleep 12:30 - 1 am, habit developed a long time ago when husband stayed up late watching movies.  Patient also had question about buying supplements at the [facility] store.  Relevant problems: prediabetes: A1c 5.7%  MEDICATIONS: reviewed, mostly supplements   DIETARY INTAKE:  24-hr recall:  B ( AM): Boost Plus or Original  Snk (11 AM): fruit or cheezit crackers or nabs, chocolate  L ( PM): whopper, fries, fruit punch Snk ( PM): whatever is in the fridge, peppers (variety of colors) D (8 PM): taco salad OR vegetables from [employer] OR trout, turnip greens and red potatoes OR Ingles meat & 2 vegetables Snk ( PM): chips Beverages: fruit punch, water   Usual physical activity: "not as much as I should" walk around in carport, walk hallway in house, planks  Provided sample Orgain protein snack bar: Best by: 10/03/2019 Lot #: [id number]  Estimated energy needs to gain weight: 1600-1800 calories   Progress Towards Goal(s):   New goals.   Nutritional Diagnosis:  NI-1.4 Inadequate energy intake As related to low calorie meals/ skipped meals.  As evidenced by dietary recall and unintended weight loss.    Intervention:  Nutrition Education. Discussed was to increase calories without increasing carbohydrate intake too much d/t pre-diabetes. Discussed need for increased protein while regaining weight. Discussed role of sleep in overall health.  Plan: [email or web address] for evaluating supplements  Ideas for higher calorie foods while also keeping carbohydrates under 40 g: Use higher fat dressings, gravies, sauces. Chicken nuggets in ranch dressing When having the trout dinner, have side that will add calories, butter on your potatoes, extra sauce if it comes with the fish. Continue using whole milk on your cereal. Look for whole milk yogurt product Perfect Protein bar (peanut butter) found in the refrigerated section may be a convenient way to get over 300 calories Nut butters Boost High protein (not MAX) or similar product  Teaching Method Utilized:  Visual Auditory  Handouts given during visit include:  Sleep Hygiene  Barriers to learning/adherence to lifestyle change: none  Demonstrated degree of understanding via:  Teach Back   Monitoring/Evaluation:  Dietary intake, exercise, and body weight prn.

## 2019-04-10 ENCOUNTER — Encounter: Payer: Self-pay | Admitting: Gynecology

## 2019-05-13 ENCOUNTER — Ambulatory Visit (INDEPENDENT_AMBULATORY_CARE_PROVIDER_SITE_OTHER): Payer: 59 | Admitting: Women's Health

## 2019-05-13 ENCOUNTER — Encounter: Payer: Self-pay | Admitting: Women's Health

## 2019-05-13 ENCOUNTER — Other Ambulatory Visit: Payer: Self-pay

## 2019-05-13 VITALS — BP 124/72 | Ht 65.0 in | Wt 132.0 lb

## 2019-05-13 DIAGNOSIS — E538 Deficiency of other specified B group vitamins: Secondary | ICD-10-CM

## 2019-05-13 DIAGNOSIS — Z01419 Encounter for gynecological examination (general) (routine) without abnormal findings: Secondary | ICD-10-CM

## 2019-05-13 DIAGNOSIS — Z23 Encounter for immunization: Secondary | ICD-10-CM

## 2019-05-13 DIAGNOSIS — Z13220 Encounter for screening for lipoid disorders: Secondary | ICD-10-CM

## 2019-05-13 IMAGING — US US ABDOMEN COMPLETE
1 series · 14 of 25 positions shown · non-contrast
Comparison: None.

CLINICAL DATA: Weight loss

EXAM:
ABDOMEN ULTRASOUND COMPLETE

[Series 1: us abdomen complete · 0.17mm/px · 14 of 111 slices shown]
[im 1/111]
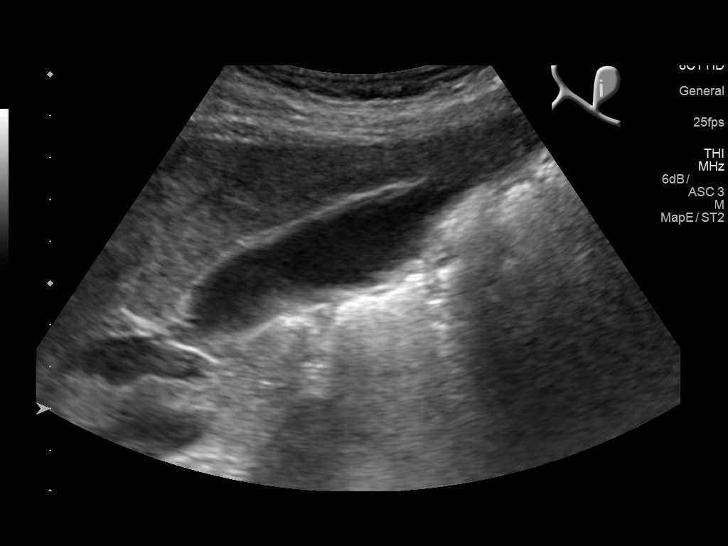
[im 10/111]
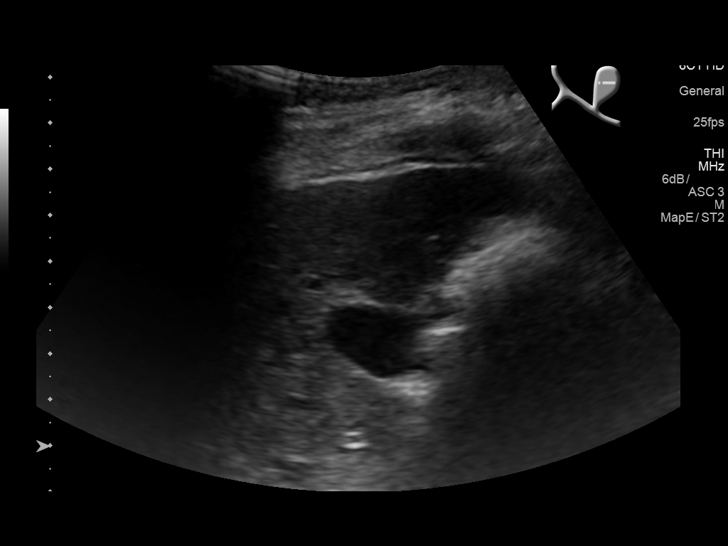
[im 19/111]
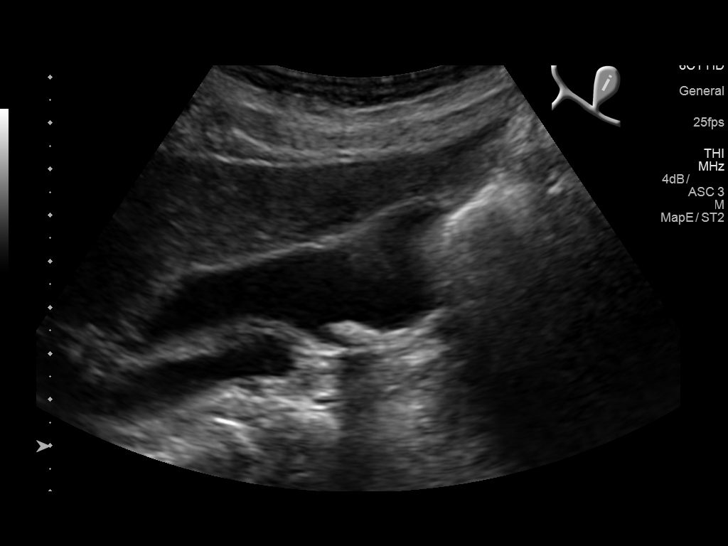
[im 28/111]
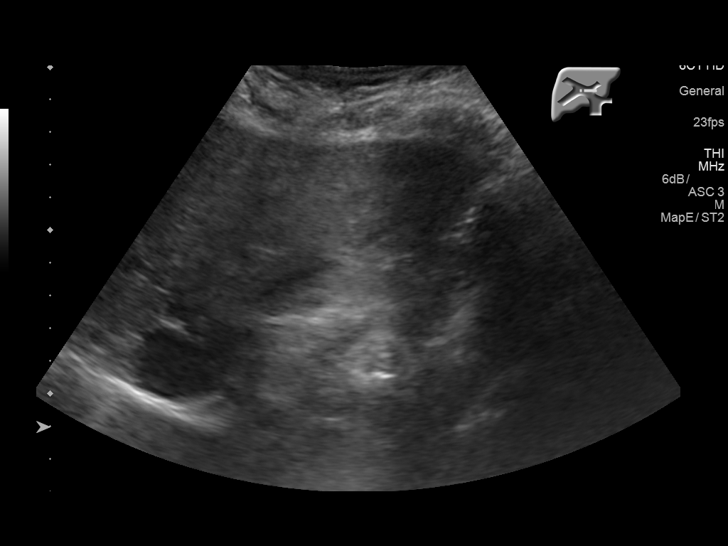
[im 37/111]
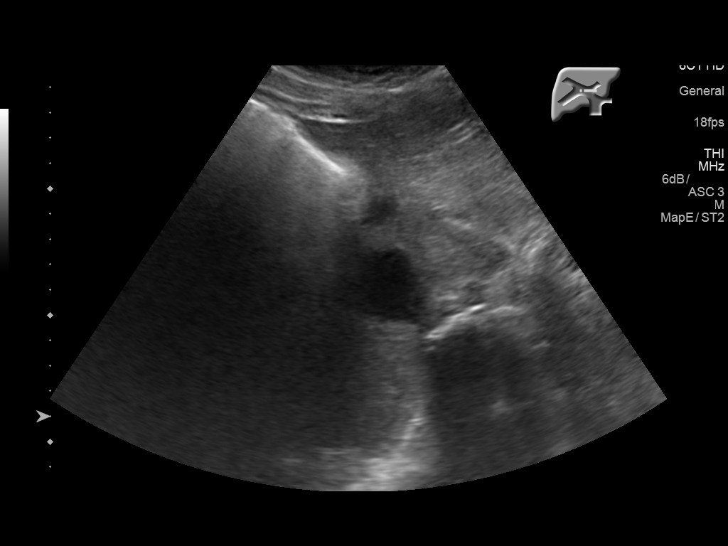
[im 42/111]
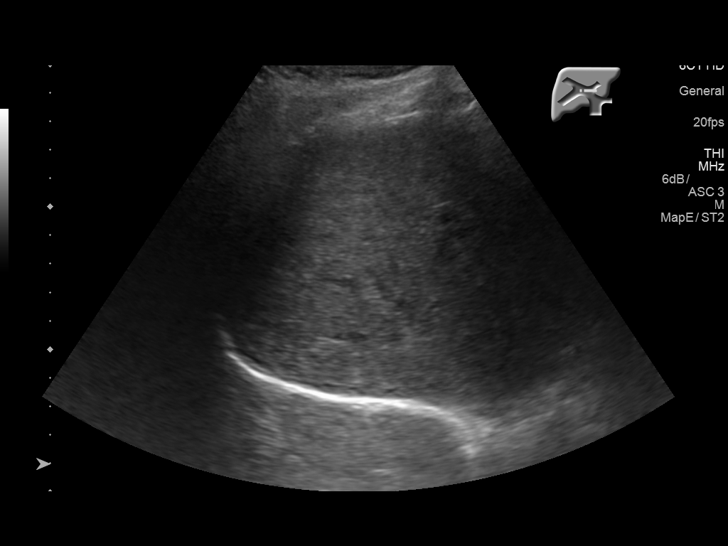
[im 51/111]
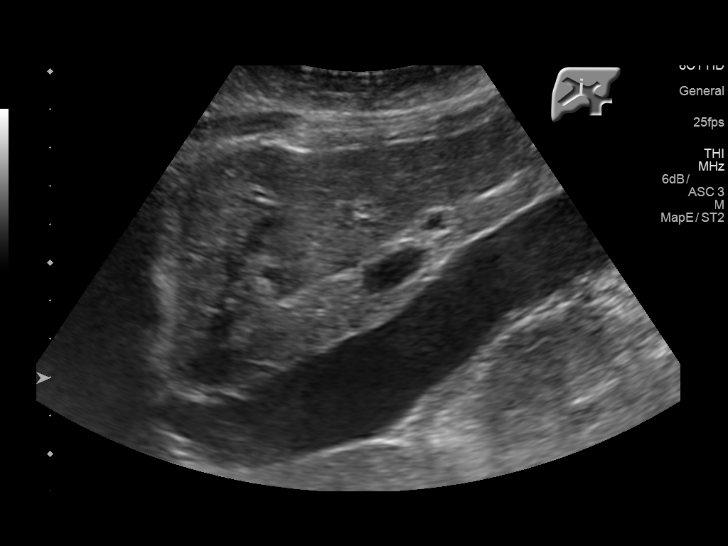
[im 60/111]
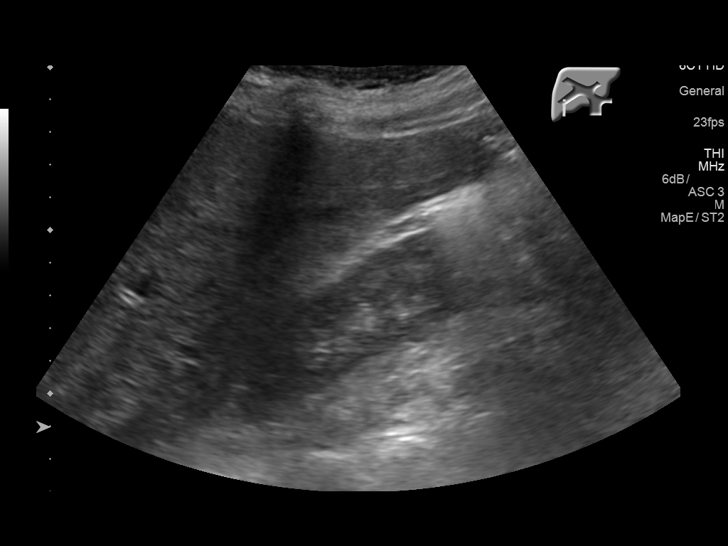
[im 69/111]
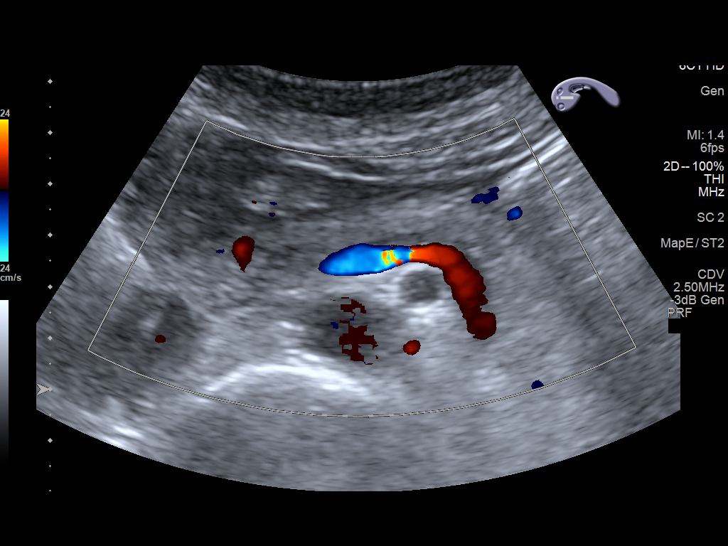
[im 74/111]
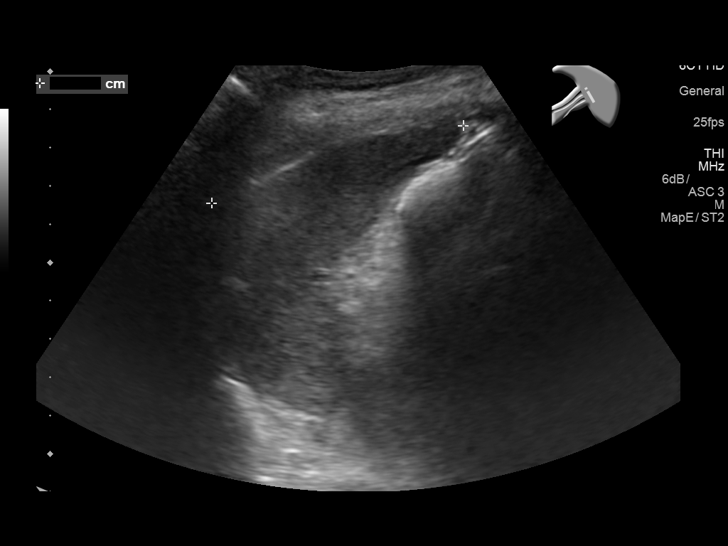
[im 83/111]
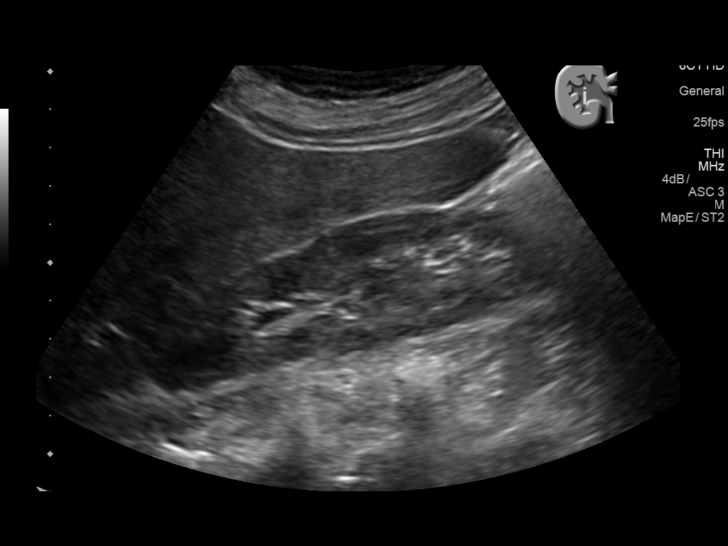
[im 92/111]
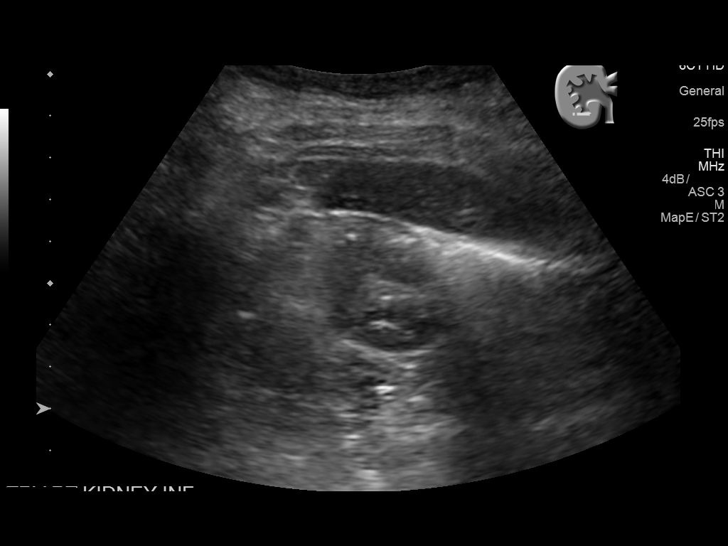
[im 101/111]
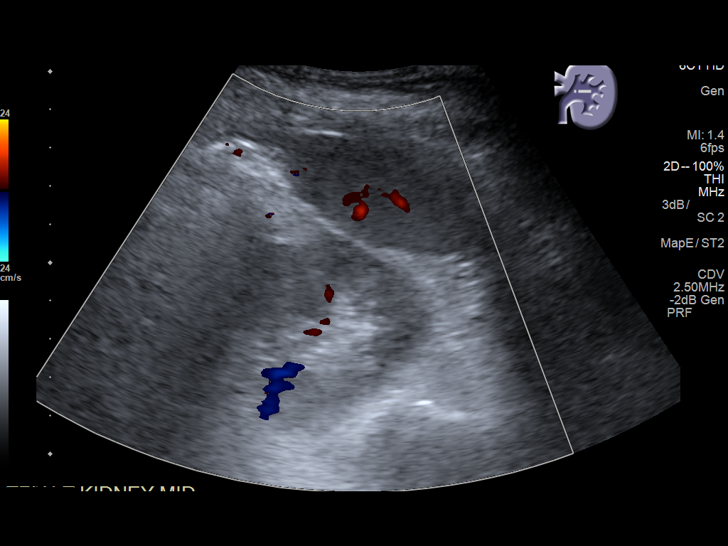
[im 111/111]
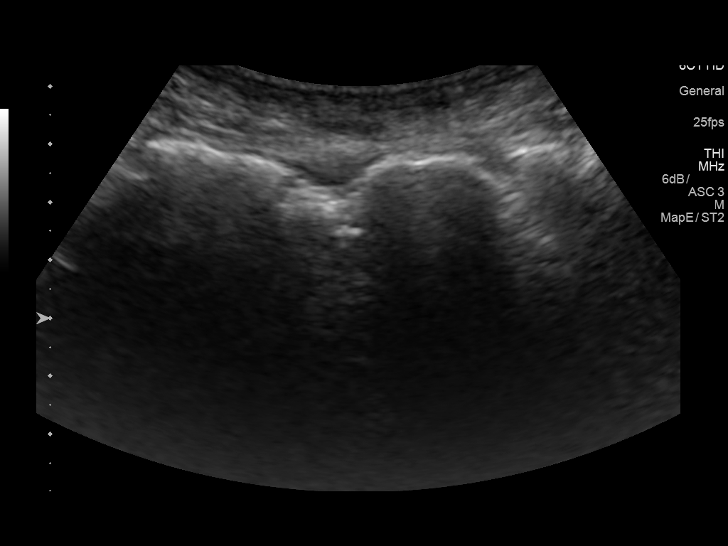

[14 of 25 positions shown; findings below may reference images not displayed]

FINDINGS: Gallbladder: The gallbladder is adequately distended. There are
multiple echogenic mobile shadowing stones measuring up to 5 mm in
diameter. There is no gallbladder wall thickening, pericholecystic
fluid, or positive sonographic Murphy's sign.

Common bile duct: Diameter: 2 mm

Liver: No focal lesion identified. Within normal limits in
parenchymal echogenicity. Portal vein is patent on color Doppler
imaging with normal direction of blood flow towards the liver.

IVC: No abnormality visualized.

Pancreas: Visualized portion unremarkable.

Spleen: Size and appearance within normal limits.

Right Kidney: Length: 11.6 cm. Echogenicity within normal limits. No
mass or hydronephrosis visualized.

Left Kidney: Length: 10.6 cm. Echogenicity within normal limits. No
mass or hydronephrosis visualized.

Abdominal aorta: No aneurysm visualized.

Other findings: There is no ascites.
IMPRESSION: Gallstones without sonographic evidence of acute cholecystitis.

No acute abnormality observed elsewhere within the abdomen.

## 2019-05-13 NOTE — Patient Instructions (Signed)
shingrex vaccine  2 series vaccine - get at pharmacy  Pnuemonia vaccine you get at a primary care  Good to see you today!  Low-Sodium Eating Plan Sodium, which is an element that makes up salt, helps you maintain a healthy balance of fluids in your body. Too much sodium can increase your blood pressure and cause fluid and waste to be held in your body. Your health care provider or dietitian may recommend following this plan if you have high blood pressure (hypertension), kidney disease, liver disease, or heart failure. Eating less sodium can help lower your blood pressure, reduce swelling, and protect your heart, liver, and kidneys. What are tips for following this plan? General guidelines  Most people on this plan should limit their sodium intake to 1,500-2,000 mg (milligrams) of sodium each day. Reading food labels   The Nutrition Facts label lists the amount of sodium in one serving of the food. If you eat more than one serving, you must multiply the listed amount of sodium by the number of servings.  Choose foods with less than 140 mg of sodium per serving.  Avoid foods with 300 mg of sodium or more per serving. Shopping  Look for lower-sodium products, often labeled as "low-sodium" or "no salt added."  Always check the sodium content even if foods are labeled as "unsalted" or "no salt added".  Buy fresh foods. ? Avoid canned foods and premade or frozen meals. ? Avoid canned, cured, or processed meats  Buy breads that have less than 80 mg of sodium per slice. Cooking  Eat more home-cooked food and less restaurant, buffet, and fast food.  Avoid adding salt when cooking. Use salt-free seasonings or herbs instead of table salt or sea salt. Check with your health care provider or pharmacist before using salt substitutes.  Cook with plant-based oils, such as canola, sunflower, or olive oil. Meal planning  When eating at a restaurant, ask that your food be prepared with less  salt or no salt, if possible.  Avoid foods that contain MSG (monosodium glutamate). MSG is sometimes added to [place] food, bouillon, and some canned foods. What foods are recommended? The items listed may not be a complete list. Talk with your dietitian about what dietary choices are best for you. Grains Low-sodium cereals, including oats, puffed wheat and rice, and shredded wheat. Low-sodium crackers. Unsalted rice. Unsalted pasta. Low-sodium bread. Whole-grain breads and whole-grain pasta. Vegetables Fresh or frozen vegetables. "No salt added" canned vegetables. "No salt added" tomato sauce and paste. Low-sodium or reduced-sodium tomato and vegetable juice. Fruits Fresh, frozen, or canned fruit. Fruit juice. Meats and other protein foods Fresh or frozen (no salt added) meat, poultry, seafood, and fish. Low-sodium canned tuna and salmon. Unsalted nuts. Dried peas, beans, and lentils without added salt. Unsalted canned beans. Eggs. Unsalted nut butters. Dairy Milk. Soy milk. Cheese that is naturally low in sodium, such as ricotta cheese, fresh mozzarella, or Swiss cheese Low-sodium or reduced-sodium cheese. Cream cheese. Yogurt. Fats and oils Unsalted butter. Unsalted margarine with no trans fat. Vegetable oils such as canola or olive oils. Seasonings and other foods Fresh and dried herbs and spices. Salt-free seasonings. Low-sodium mustard and ketchup. Sodium-free salad dressing. Sodium-free light mayonnaise. Fresh or refrigerated horseradish. Lemon juice. Vinegar. Homemade, reduced-sodium, or low-sodium soups. Unsalted popcorn and pretzels. Low-salt or salt-free chips. What foods are not recommended? The items listed may not be a complete list. Talk with your dietitian about what dietary choices are best for you. Grains  Instant hot cereals. Bread stuffing, pancake, and biscuit mixes. Croutons. Seasoned rice or pasta mixes. Noodle soup cups. Boxed or frozen macaroni and cheese. Regular salted  crackers. Self-rising flour. Vegetables Sauerkraut, pickled vegetables, and relishes. Olives. [place] fries. Onion rings. Regular canned vegetables (not low-sodium or reduced-sodium). Regular canned tomato sauce and paste (not low-sodium or reduced-sodium). Regular tomato and vegetable juice (not low-sodium or reduced-sodium). Frozen vegetables in sauces. Meats and other protein foods Meat or fish that is salted, canned, smoked, spiced, or pickled. Bacon, ham, sausage, hotdogs, corned beef, chipped beef, packaged lunch meats, salt pork, jerky, pickled herring, anchovies, regular canned tuna, sardines, salted nuts. Dairy Processed cheese and cheese spreads. Cheese curds. Blue cheese. Feta cheese. String cheese. Regular cottage cheese. Buttermilk. Canned milk. Fats and oils Salted butter. Regular margarine. Ghee. Bacon fat. Seasonings and other foods Onion salt, garlic salt, seasoned salt, table salt, and sea salt. Canned and packaged gravies. Worcestershire sauce. Tartar sauce. Barbecue sauce. Teriyaki sauce. Soy sauce, including reduced-sodium. Steak sauce. Fish sauce. Oyster sauce. Cocktail sauce. Horseradish that you find on the shelf. Regular ketchup and mustard. Meat flavorings and tenderizers. Bouillon cubes. Hot sauce and Tabasco sauce. Premade or packaged marinades. Premade or packaged taco seasonings. Relishes. Regular salad dressings. Salsa. Potato and tortilla chips. Corn chips and puffs. Salted popcorn and pretzels. Canned or dried soups. Pizza. Frozen entrees and pot pies. Summary  Eating less sodium can help lower your blood pressure, reduce swelling, and protect your heart, liver, and kidneys.  Most people on this plan should limit their sodium intake to 1,500-2,000 mg (milligrams) of sodium each day.  Canned, boxed, and frozen foods are high in sodium. Restaurant foods, fast foods, and pizza are also very high in sodium. You also get sodium by adding salt to food.  Try to cook at  home, eat more fresh fruits and vegetables, and eat less fast food, canned, processed, or prepared foods. This information is not intended to replace advice given to you by your health care provider. Make sure you discuss any questions you have with your health care provider. Document Released: 12/10/2001 Document Revised: 06/02/2017 Document Reviewed: 06/13/2016 Elsevier Patient Education  2020 [place] Maintenance After Age 33 After age 58, you are at a higher risk for certain long-term diseases and infections as well as injuries from falls. Falls are a major cause of broken bones and head injuries in people who are older than age 72. Getting regular preventive care can help to keep you healthy and well. Preventive care includes getting regular testing and making lifestyle changes as recommended by your health care provider. Talk with your health care provider about:  Which screenings and tests you should have. A screening is a test that checks for a disease when you have no symptoms.  A diet and exercise plan that is right for you. What should I know about screenings and tests to prevent falls? Screening and testing are the best ways to find a health problem early. Early diagnosis and treatment give you the best chance of managing medical conditions that are common after age 12. Certain conditions and lifestyle choices may make you more likely to have a fall. Your health care provider may recommend:  Regular vision checks. Poor vision and conditions such as cataracts can make you more likely to have a fall. If you wear glasses, make sure to get your prescription updated if your vision changes.  Medicine review. Work with your health care provider to regularly  review all of the medicines you are taking, including over-the-counter medicines. Ask your health care provider about any side effects that may make you more likely to have a fall. Tell your health care provider if any medicines  that you take make you feel dizzy or sleepy.  Osteoporosis screening. Osteoporosis is a condition that causes the bones to get weaker. This can make the bones weak and cause them to break more easily.  Blood pressure screening. Blood pressure changes and medicines to control blood pressure can make you feel dizzy.  Strength and balance checks. Your health care provider may recommend certain tests to check your strength and balance while standing, walking, or changing positions.  Foot health exam. Foot pain and numbness, as well as not wearing proper footwear, can make you more likely to have a fall.  Depression screening. You may be more likely to have a fall if you have a fear of falling, feel emotionally low, or feel unable to do activities that you used to do.  Alcohol use screening. Using too much alcohol can affect your balance and may make you more likely to have a fall. What actions can I take to lower my risk of falls? General instructions  Talk with your health care provider about your risks for falling. Tell your health care provider if: ? You fall. Be sure to tell your health care provider about all falls, even ones that seem minor. ? You feel dizzy, sleepy, or off-balance.  Take over-the-counter and prescription medicines only as told by your health care provider. These include any supplements.  Eat a healthy diet and maintain a healthy weight. A healthy diet includes low-fat dairy products, low-fat (lean) meats, and fiber from whole grains, beans, and lots of fruits and vegetables. Home safety  Remove any tripping hazards, such as rugs, cords, and clutter.  Install safety equipment such as grab bars in bathrooms and safety rails on stairs.  Keep rooms and walkways well-lit. Activity   Follow a regular exercise program to stay fit. This will help you maintain your balance. Ask your health care provider what types of exercise are appropriate for you.  If you need a cane  or walker, use it as recommended by your health care provider.  Wear supportive shoes that have nonskid soles. Lifestyle  Do not drink alcohol if your health care provider tells you not to drink.  If you drink alcohol, limit how much you have: ? 0-1 drink a day for women. ? 0-2 drinks a day for men.  Be aware of how much alcohol is in your drink. In the [place] one drink equals one typical bottle of beer (12 oz), one-half glass of wine (5 oz), or one shot of hard liquor (1 oz).  Do not use any products that contain nicotine or tobacco, such as cigarettes and e-cigarettes. If you need help quitting, ask your health care provider. Summary  Having a healthy lifestyle and getting preventive care can help to protect your health and wellness after age 37.  Screening and testing are the best way to find a health problem early and help you avoid having a fall. Early diagnosis and treatment give you the best chance for managing medical conditions that are more common for people who are older than age 35.  Falls are a major cause of broken bones and head injuries in people who are older than age 74. Take precautions to prevent a fall at home.  Work with your health care  provider to learn what changes you can make to improve your health and wellness and to prevent falls. This information is not intended to replace advice given to you by your health care provider. Make sure you discuss any questions you have with your health care provider. Document Released: 05/03/2017 Document Revised: 10/11/2018 Document Reviewed: 05/03/2017 Elsevier Patient Education  2020 [employer].

## 2019-05-13 NOTE — Progress Notes (Signed)
Denise [name] 1950-08-07 [id number]    History:    Presents for annual exam.  Postmenopausal on no HRT with no bleeding.  Normal Pap and mammogram history.  Same partner many years.  2019 T score -1.9 at right hip, -1.5 hip average FRAX 3.2% / 0.2%.  2017 benign colon polyp 5-year follow-up.  Has has not had Shingrix or Pneumovax.  Past medical history, past surgical history, family history and social history were all reviewed and documented in the EPIC chart.  [employer].  3 children all doing well.  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  ROS:  A ROS was performed and pertinent positives and negatives are included.  Exam:  Vitals:   05/13/19 0838  BP: 124/72  Weight: 132 lb (59.9 kg)  Height: 5\' 5"  (1.651 m)   Body mass index is 21.97 kg/m.   General appearance:  Normal Thyroid:  Symmetrical, normal in size, without palpable masses or nodularity. Respiratory  Auscultation:  Clear without wheezing or rhonchi Cardiovascular  Auscultation:  Regular rate, without rubs, murmurs or gallops  Edema/varicosities: Bilateral ankle edema states always, worse in the evening  Abdominal  Soft,nontender, without masses, guarding or rebound.  Liver/spleen:  No organomegaly noted  Hernia:  None appreciated  Skin  Inspection:  Grossly normal   Breasts: Examined lying and sitting.     Right: Without masses, retractions, discharge or axillary adenopathy.     Left: Without masses, retractions, discharge or axillary adenopathy. Gentitourinary   Inguinal/mons:  Normal without inguinal adenopathy  External genitalia:  Normal  BUS/Urethra/Skene's glands:  Normal  Vagina:  Normal  Cervix:  Normal +1 cystocele  Uterus: normal in size, shape and contour.  Midline and mobile  Adnexa/parametria:     Rt: Without masses or tenderness.   Lt: Without masses or tenderness.  Anus and perineum: Normal  Digital rectal exam: Normal sphincter tone without palpated masses or  tenderness  Assessment/Plan:  68 y.o. S BF G6 P3 for annual exam with no complaints.  Postmenopausal on no HRT with no bleeding +1 cystocele mild stress incontinence Osteopenia without elevated FRAX Bilateral ankle edema-instructed to follow-up with primary care Glaucoma-ophthalmologist manages  Plan: Low-sodium diet, follow-up with primary care due to ankle edema which she states has been a problem for years.  Reviewed blood pressure normal and has been.  SBEs, continue annual screening mammogram due this month instructed to schedule.  Encouraged  weightbearing and balance type exercise such as yoga.  Home safety, fall prevention discussed.  Vitamin D 1000 daily encouraged, history of normal vitamin D.  Shingrix and Pneumovax discussed, instructed to get Shingrix at the pharmacy and Pneumovax at primary care.  Flu vaccine given today.  CBC, CMP, lipid panel, B12, history of vitamin B12 deficiency.  Pap normal 2019, new screening guidelines reviewed.   [place], 9:07 AM 05/13/2019

## 2019-05-14 ENCOUNTER — Encounter: Payer: PRIVATE HEALTH INSURANCE | Admitting: Women's Health

## 2019-05-14 ENCOUNTER — Other Ambulatory Visit (INDEPENDENT_AMBULATORY_CARE_PROVIDER_SITE_OTHER): Payer: Self-pay | Admitting: Nurse Practitioner

## 2019-05-14 LAB — LIPID PANEL
Cholesterol: 154 mg/dL (ref <200)
HDL: 57 mg/dL (ref >=50)
LDL Cholesterol (Calc): 84 mg/dL (calc)
Non-HDL Cholesterol (Calc): 97 mg/dL (calc) (ref <130)
Total CHOL/HDL Ratio: 2.7 (calc) (ref <5.0)
Triglycerides: 44 mg/dL (ref <150)

## 2019-05-14 LAB — COMPREHENSIVE METABOLIC PANEL
AG Ratio: 1.8 (calc) (ref 1.0–2.5)
ALT: 9 U/L (ref 6–29)
AST: 15 U/L (ref 10–35)
Albumin: 3.9 g/dL (ref 3.6–5.1)
Alkaline phosphatase (APISO): 55 U/L (ref 37–153)
BUN/Creatinine Ratio: 13 (calc) (ref 6–22)
BUN: 13 mg/dL (ref 7–25)
CO2: 26 mmol/L (ref 20–32)
Calcium: 9.4 mg/dL (ref 8.6–10.4)
Chloride: 109 mmol/L (ref 98–110)
Creat: 1.04 mg/dL — ABNORMAL HIGH (ref 0.50–0.99)
Globulin: 2.2 g/dL (calc) (ref 1.9–3.7)
Glucose, Bld: 102 mg/dL — ABNORMAL HIGH (ref 65–99)
Potassium: 4.1 mmol/L (ref 3.5–5.3)
Sodium: 142 mmol/L (ref 135–146)
Total Bilirubin: 0.7 mg/dL (ref 0.2–1.2)
Total Protein: 6.1 g/dL (ref 6.1–8.1)

## 2019-05-14 LAB — CBC WITH DIFFERENTIAL/PLATELET
Absolute Monocytes: 312 cells/uL (ref 200–950)
Basophils Absolute: 40 cells/uL (ref 0–200)
Basophils Relative: 1 %
Eosinophils Absolute: 112 cells/uL (ref 15–500)
Eosinophils Relative: 2.8 %
HCT: 38.3 % (ref 35.0–45.0)
Hemoglobin: 12.7 g/dL (ref 11.7–15.5)
Lymphs Abs: 1380 cells/uL (ref 850–3900)
MCH: 28.8 pg (ref 27.0–33.0)
MCHC: 33.2 g/dL (ref 32.0–36.0)
MCV: 86.8 fL (ref 80.0–100.0)
MPV: 10.7 fL (ref 7.5–12.5)
Monocytes Relative: 7.8 %
Neutro Abs: 2156 cells/uL (ref 1500–7800)
Neutrophils Relative %: 53.9 %
Platelets: 288 10*3/uL (ref 140–400)
RBC: 4.41 10*6/uL (ref 3.80–5.10)
RDW: 12.7 % (ref 11.0–15.0)
Total Lymphocyte: 34.5 %
WBC: 4 10*3/uL (ref 3.8–10.8)

## 2019-05-14 LAB — VITAMIN B12: Vitamin B-12: 429 pg/mL (ref 200–1100)

## 2019-05-27 ENCOUNTER — Encounter: Payer: 59 | Admitting: Nurse Practitioner

## 2019-06-06 LAB — HM MAMMOGRAPHY

## 2019-06-17 ENCOUNTER — Encounter: Payer: Self-pay | Admitting: Nurse Practitioner

## 2019-07-04 ENCOUNTER — Telehealth: Payer: Self-pay

## 2019-07-04 ENCOUNTER — Encounter: Payer: 59 | Admitting: Nurse Practitioner

## 2019-07-04 NOTE — Telephone Encounter (Signed)
LVM for pt to call the office to reschedule missed physical appt

## 2019-07-17 ENCOUNTER — Encounter: Payer: Self-pay | Admitting: Nurse Practitioner

## 2019-07-31 ENCOUNTER — Other Ambulatory Visit: Payer: Self-pay

## 2019-07-31 ENCOUNTER — Encounter: Payer: Self-pay | Admitting: Nurse Practitioner

## 2019-07-31 ENCOUNTER — Ambulatory Visit (INDEPENDENT_AMBULATORY_CARE_PROVIDER_SITE_OTHER): Payer: 59 | Admitting: Nurse Practitioner

## 2019-07-31 VITALS — BP 112/76 | HR 71 | Temp 98.9°F | Ht 66.0 in | Wt 136.8 lb

## 2019-07-31 DIAGNOSIS — R600 Localized edema: Secondary | ICD-10-CM

## 2019-07-31 DIAGNOSIS — Z0000 Encounter for general adult medical examination without abnormal findings: Secondary | ICD-10-CM

## 2019-07-31 DIAGNOSIS — Z23 Encounter for immunization: Secondary | ICD-10-CM

## 2019-07-31 LAB — POCT URINALYSIS DIPSTICK
Bilirubin, UA: NEGATIVE
Glucose, UA: NEGATIVE
Ketones, UA: NEGATIVE
Leukocytes, UA: NEGATIVE
Nitrite, UA: NEGATIVE
Protein, UA: NEGATIVE
Spec Grav, UA: >=1.03 — AB (ref 1.010–1.025)
Urobilinogen, UA: 1 E.U./dL
pH, UA: 6.5 (ref 5.0–8.0)

## 2019-07-31 MED ORDER — PNEUMOCOCCAL 13-VAL CONJ VACC IM SUSP: 0.5000 mL | INTRAMUSCULAR | 0 refills | Status: AC

## 2019-07-31 NOTE — Progress Notes (Signed)
Subjective:     Patient ID: Denise [name] , female    DOB: 05/05/1951 , 69 y.o.   MRN: [id number]   Chief Complaint  Patient presents with  . Annual Exam   The patient states she uses abstinence and post menopausal status for birth control.  Mammogram last  at [facility] - on 06/06/2019. She also had her GYN visit with  Elon Alas.  Negative for: breast discharge, breast lump(s), breast pain and breast self exam.  Pertinent negatives include abnormal bleeding (hematology), anxiety, decreased libido, depression, difficulty falling sleep, dyspareunia, history of infertility, nocturia, sexual dysfunction, sleep disturbances, urinary incontinence, urinary urgency, vaginal discharge and vaginal itching. Diet regular. The patient states her exercise level is minimal she is going to the chiropractor for her low back pain.    The patient's tobacco use is:  Social History   Tobacco Use  Smoking Status Never Smoker  Smokeless Tobacco Never Used   She has been exposed to passive smoke. The patient's alcohol use is:  Social History   Substance and Sexual Activity  Alcohol Use Yes   Comment: Rare   Additional information: Last pap October 2019.  HPI  Here for HM  She is taking an oral vitamin B supplement.  She feels she is doing well with her memory and fatigue.  Wt Readings from Last 3 Encounters: 07/31/19 : 136 lb 12.8 oz (62.1 kg) 05/13/19 : 132 lb (59.9 kg) 01/09/19 : 132 lb 11.2 oz (60.2 kg)     Past Medical History:  Diagnosis Date  . Chronic kidney disease    as a child, no present issues   . Glaucoma   . Osteopenia 06/2018   T score -1.9 FRAX 3% / 0.2%  . 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 Hypertension Mother   . Heart disease Mother   . Diabetes Father   . Hypertension Father   . Diabetes Sister   . Tuberculosis Maternal Grandmother   . Heart disease Sister   . Colitis Daughter   . Colon cancer Neg Hx   . Colon polyps  Neg Hx   . Esophageal cancer Neg Hx   . Rectal cancer Neg Hx   . Stomach cancer Neg Hx      Current Outpatient Medications:  .  Cholecalciferol (VITAMIN D3) 10 MCG (400 UNIT) CAPS, Take 1 capsule by mouth daily. Pt unaware of dose, Disp: , Rfl:  .  timolol (BETIMOL) 0.25 % ophthalmic solution, 1-2 drops daily. , Disp: , Rfl:  .  Travoprost, BAK Free, (TRAVATAN) 0.004 % SOLN ophthalmic solution, 1 drop at bedtime., Disp: , Rfl:    No Known Allergies   Review of Systems  Constitutional: Negative.  Negative for fatigue.  HENT: Negative.   Eyes: Negative.   Respiratory: Negative.   Cardiovascular: Negative.   Gastrointestinal: Negative.  Negative for constipation.  Endocrine: Negative.   Genitourinary: Negative.   Musculoskeletal: Negative.   Skin: Negative.   Allergic/Immunologic: Negative.   Neurological: Negative.   Hematological: Negative.   Psychiatric/Behavioral: Negative.      Today's Vitals   07/31/19 1409  BP: 112/76  Pulse: 71  Temp: 98.9 F (37.2 C)  TempSrc: Oral  Weight: 136 lb 12.8 oz (62.1 kg)  Height: 5\' 6"  (1.676 m)  PainSc: 0-No pain   Body mass index is 22.08 kg/m.   Objective:  Physical Exam Vitals reviewed.  Constitutional:  Appearance: She is well-developed.  HENT:     Head: Normocephalic and atraumatic.     Right Ear: Hearing, tympanic membrane, ear canal and external ear normal. There is no impacted cerumen.     Left Ear: Hearing, tympanic membrane, ear canal and external ear normal. There is no impacted cerumen.  Eyes:     General: Lids are normal.     Extraocular Movements: Extraocular movements intact.     Conjunctiva/sclera: Conjunctivae normal.     Funduscopic exam:    Right eye: No papilledema.        Left eye: No papilledema.  Neck:     Thyroid: No thyroid mass.     Vascular: No carotid bruit.  Cardiovascular:     Rate and Rhythm: Normal rate and regular rhythm.     Pulses: Normal pulses.     Heart sounds: Normal heart  sounds. No murmur.  Pulmonary:     Effort: Pulmonary effort is normal. No respiratory distress.     Breath sounds: Normal breath sounds.  Abdominal:     General: Abdomen is flat. Bowel sounds are normal.     Palpations: Abdomen is soft.  Musculoskeletal:        General: Normal range of motion.     Cervical back: Full passive range of motion without pain, normal range of motion and neck supple.     Right lower leg: Edema (1+) present.     Left lower leg: Edema (1+) present.  Skin:    General: Skin is warm and dry.     Capillary Refill: Capillary refill takes less than 2 seconds.  Neurological:     General: No focal deficit present.     Mental Status: She is alert and oriented to person, place, and time.     Cranial Nerves: No cranial nerve deficit.     Sensory: No sensory deficit.  Psychiatric:        Behavior: Behavior normal.        Thought Content: Thought content normal.        Judgment: Judgment normal.         Assessment And Plan:     1. Encounter for general adult medical examination w/o abnormal findings . Behavior modifications discussed and diet history reviewed.   . Pt will continue to exercise regularly and modify diet with low GI, plant based foods and decrease intake of processed foods.  . Recommend intake of daily multivitamin, Vitamin D, and calcium.  . Recommend mammogram (up to date) and colonoscopy (up to date) for preventive screenings, as well as recommend immunizations that include influenza, TDAP, and Shingles  - POCT Urinalysis Dipstick (81002)  2. Encounter for immunization  Pneumonia 13 immunization sent to pharmacy.   3. Localized edema  Bilateral lower extremities with 1 + edema  Negative for dyspnea, wheezes, or crackles  Advised to elevate her feet when possible and limit salt intake  She is to also get support socks.      Minette Brine, FNP

## 2019-07-31 NOTE — Patient Instructions (Signed)
Health Maintenance  Topic Date Due  . PNA vac Low Risk Adult (1 of 2 - PCV13) 06/04/2017  . MAMMOGRAM  06/05/2021  . COLONOSCOPY  07/09/2025  . TETANUS/TDAP  05/02/2027  . INFLUENZA VACCINE  Completed  . DEXA SCAN  Completed  . Hepatitis C Screening  Completed   Health Maintenance After Age 68 After age 27, you are at a higher risk for certain long-term diseases and infections as well as injuries from falls. Falls are a major cause of broken bones and head injuries in people who are older than age 53. Getting regular preventive care can help to keep you healthy and well. Preventive care includes getting regular testing and making lifestyle changes as recommended by your health care provider. Talk with your health care provider about:  Which screenings and tests you should have. A screening is a test that checks for a disease when you have no symptoms.  A diet and exercise plan that is right for you. What should I know about screenings and tests to prevent falls? Screening and testing are the best ways to find a health problem early. Early diagnosis and treatment give you the best chance of managing medical conditions that are common after age 56. Certain conditions and lifestyle choices may make you more likely to have a fall. Your health care provider may recommend:  Regular vision checks. Poor vision and conditions such as cataracts can make you more likely to have a fall. If you wear glasses, make sure to get your prescription updated if your vision changes.  Medicine review. Work with your health care provider to regularly review all of the medicines you are taking, including over-the-counter medicines. Ask your health care provider about any side effects that may make you more likely to have a fall. Tell your health care provider if any medicines that you take make you feel dizzy or sleepy.  Osteoporosis screening. Osteoporosis is a condition that causes the bones to get weaker. This can  make the bones weak and cause them to break more easily.  Blood pressure screening. Blood pressure changes and medicines to control blood pressure can make you feel dizzy.  Strength and balance checks. Your health care provider may recommend certain tests to check your strength and balance while standing, walking, or changing positions.  Foot health exam. Foot pain and numbness, as well as not wearing proper footwear, can make you more likely to have a fall.  Depression screening. You may be more likely to have a fall if you have a fear of falling, feel emotionally low, or feel unable to do activities that you used to do.  Alcohol use screening. Using too much alcohol can affect your balance and may make you more likely to have a fall. What actions can I take to lower my risk of falls? General instructions  Talk with your health care provider about your risks for falling. Tell your health care provider if: ? You fall. Be sure to tell your health care provider about all falls, even ones that seem minor. ? You feel dizzy, sleepy, or off-balance.  Take over-the-counter and prescription medicines only as told by your health care provider. These include any supplements.  Eat a healthy diet and maintain a healthy weight. A healthy diet includes low-fat dairy products, low-fat (lean) meats, and fiber from whole grains, beans, and lots of fruits and vegetables. Home safety  Remove any tripping hazards, such as rugs, cords, and clutter.  Install safety equipment  such as grab bars in bathrooms and safety rails on stairs.  Keep rooms and walkways well-lit. Activity   Follow a regular exercise program to stay fit. This will help you maintain your balance. Ask your health care provider what types of exercise are appropriate for you.  If you need a cane or walker, use it as recommended by your health care provider.  Wear supportive shoes that have nonskid soles. Lifestyle  Do not drink  alcohol if your health care provider tells you not to drink.  If you drink alcohol, limit how much you have: ? 0-1 drink a day for women. ? 0-2 drinks a day for men.  Be aware of how much alcohol is in your drink. In the [place] one drink equals one typical bottle of beer (12 oz), one-half glass of wine (5 oz), or one shot of hard liquor (1 oz).  Do not use any products that contain nicotine or tobacco, such as cigarettes and e-cigarettes. If you need help quitting, ask your health care provider. Summary  Having a healthy lifestyle and getting preventive care can help to protect your health and wellness after age 35.  Screening and testing are the best way to find a health problem early and help you avoid having a fall. Early diagnosis and treatment give you the best chance for managing medical conditions that are more common for people who are older than age 74.  Falls are a major cause of broken bones and head injuries in people who are older than age 39. Take precautions to prevent a fall at home.  Work with your health care provider to learn what changes you can make to improve your health and wellness and to prevent falls. This information is not intended to replace advice given to you by your health care provider. Make sure you discuss any questions you have with your health care provider. Document Revised: 10/11/2018 Document Reviewed: 05/03/2017 Elsevier Patient Education  [place].   Edema  Edema is when you have too much fluid in your body or under your skin. Edema may make your legs, feet, and ankles swell up. Swelling is also common in looser tissues, like around your eyes. This is a common condition. It gets more common as you get older. There are many possible causes of edema. Eating too much salt (sodium) and being on your feet or sitting for a long time can cause edema in your legs, feet, and ankles. Hot weather may make edema worse. Edema is usually painless. Your  skin may look swollen or shiny. Follow these instructions at home:  Keep the swollen body part raised (elevated) above the level of your heart when you are sitting or lying down.  Do not sit still or stand for a long time.  Do not wear tight clothes. Do not wear garters on your upper legs.  Exercise your legs. This can help the swelling go down.  Wear elastic bandages or support stockings as told by your doctor.  Eat a low-salt (low-sodium) diet to reduce fluid as told by your doctor.  Depending on the cause of your swelling, you may need to limit how much fluid you drink (fluid restriction).  Take over-the-counter and prescription medicines only as told by your doctor. Contact a doctor if:  Treatment is not working.  You have heart, liver, or kidney disease and have symptoms of edema.  You have sudden and unexplained weight gain. Get help right away if:  You have  shortness of breath or chest pain.  You cannot breathe when you lie down.  You have pain, redness, or warmth in the swollen areas.  You have heart, liver, or kidney disease and get edema all of a sudden.  You have a fever and your symptoms get worse all of a sudden. Summary  Edema is when you have too much fluid in your body or under your skin.  Edema may make your legs, feet, and ankles swell up. Swelling is also common in looser tissues, like around your eyes.  Raise (elevate) the swollen body part above the level of your heart when you are sitting or lying down.  Follow your doctor's instructions about diet and how much fluid you can drink (fluid restriction). This information is not intended to replace advice given to you by your health care provider. Make sure you discuss any questions you have with your health care provider. Document Revised: 06/23/2017 Document Reviewed: 07/08/2016 Elsevier Patient Education  2020 [place] SUPPORT SOCKS DURING THE DAY AND KEEP FEET ELEVATED WHEN POSSIBLE    IF ANY SHORTNESS OF BREATH OR NO IMPROVEMENT OVERNIGHT WHEN ELEVATING RETURN TO OFFICE.

## 2019-11-11 ENCOUNTER — Encounter: Payer: Self-pay | Admitting: Nurse Practitioner

## 2019-11-11 ENCOUNTER — Ambulatory Visit: Payer: 59 | Admitting: Nurse Practitioner

## 2019-11-11 ENCOUNTER — Other Ambulatory Visit: Payer: Self-pay

## 2019-11-11 VITALS — BP 132/88 | HR 87 | Temp 98.5°F | Ht 66.0 in | Wt 131.2 lb

## 2019-11-11 DIAGNOSIS — M2560 Stiffness of unspecified joint, not elsewhere classified: Secondary | ICD-10-CM | POA: Diagnosis not present

## 2019-11-11 NOTE — Progress Notes (Signed)
This visit occurred during the SARS-CoV-2 public health emergency.  Safety protocols were in place, including screening questions prior to the visit, additional usage of staff PPE, and extensive cleaning of exam room while observing appropriate contact time as indicated for disinfecting solutions.  Subjective:     Patient ID: Denise [name] , female    DOB: [birth year] , 69 y.o.   MRN: [id number]   Chief Complaint  Patient presents with  . stiffness    patient's daughter stated she noticed her mom has been tensing up when walking.  . mole    patient's has a spot on her back that she wants to be looked at     HPI  She has a "mole" on her back.  She reports having stiffness. Reports in the last 2-3 weeks.  She is walking around the garage. She is going to the chiropractor, took xrays and working with him for the last 6 months.  Sometimes feels like a backpack on her back but knows not anything.  Some days she does better with water.    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 She is writing smaller, taking her longer to get dressed.      Past Medical History:  Diagnosis Date  . Chronic kidney disease    as a child, no present issues   . Glaucoma   . Osteopenia 06/2018   T score -1.9 FRAX 3% / 0.2%  . 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 Hypertension Mother   . Heart disease Mother   . Diabetes Father   . Hypertension Father   . Diabetes Sister   . Tuberculosis Maternal Grandmother   . Heart disease Sister   . Colitis Daughter   . Colon cancer Neg Hx   . Colon polyps Neg Hx   . Esophageal cancer Neg Hx   . Rectal cancer Neg Hx   . Stomach cancer Neg Hx      Current Outpatient Medications:  .  Cholecalciferol (VITAMIN D3) 10 MCG (400 UNIT) CAPS, Take 1 capsule by mouth daily. Pt unaware of dose, Disp: , Rfl:  .  timolol (BETIMOL) 0.25 % ophthalmic solution, 1-2 drops daily. , Disp: , Rfl:   .  Travoprost, BAK Free, (TRAVATAN) 0.004 % SOLN ophthalmic solution, 1 drop at bedtime., Disp: , Rfl:    No Known Allergies   Review of Systems  Constitutional: Negative.   Respiratory: Negative.   Cardiovascular: Negative.  Negative for chest pain, palpitations and leg swelling.  Musculoskeletal:       Gait is slower per daughter and patient  Neurological: Negative for dizziness and headaches.  Psychiatric/Behavioral: Negative.  Negative for behavioral problems.     Today's Vitals   11/11/19 1212  BP: 132/88  Pulse: 87  Temp: 98.5 F (36.9 C)  TempSrc: Oral  Weight: 131 lb 3.2 oz (59.5 kg)  Height: '5\' 6"'  (1.676 m)  PainSc: 0-No pain   Body mass index is 21.18 kg/m.   Objective:  Physical Exam Vitals reviewed.  Constitutional:      General: She is not in acute distress.    Appearance: Normal appearance. She is obese.  Cardiovascular:     Rate and Rhythm: Normal rate and regular rhythm.     Pulses: Normal pulses.     Heart sounds: Normal heart sounds. No murmur.  Pulmonary:     Effort:  Pulmonary effort is normal. No respiratory distress.     Breath sounds: Normal breath sounds.  Skin:    Capillary Refill: Capillary refill takes less than 2 seconds.  Neurological:     General: No focal deficit present.     Mental Status: She is alert and oriented to person, place, and time.     Cranial Nerves: No cranial nerve deficit.     Coordination: Romberg sign positive (slightly positive).     Comments: She is slow to respond with some of her questions.  Mild cogwheeling to her right wrist.   Psychiatric:        Mood and Affect: Mood normal.        Behavior: Behavior normal.        Thought Content: Thought content normal.        Judgment: Judgment normal.         Assessment And Plan:     1. Joint stiffness  I will check for autoimmune panel.   She has mild small steps when walking   She has mild cogwheeling to right wrist.   - CMP14+EGFR - Vitamin B12 -  TSH - Ambulatory referral to Neurology - Autoimmune Profile - Sed Rate (ESR)   Minette Brine, FNP    THE PATIENT IS ENCOURAGED TO PRACTICE SOCIAL DISTANCING DUE TO THE COVID-19 PANDEMIC.

## 2019-11-12 LAB — CMP14+EGFR
ALT: 9 IU/L (ref 0–32)
AST: 20 IU/L (ref 0–40)
Albumin/Globulin Ratio: 1.6 (ref 1.2–2.2)
Albumin: 4.2 g/dL (ref 3.8–4.8)
Alkaline Phosphatase: 78 IU/L (ref 39–117)
BUN/Creatinine Ratio: 14 (ref 12–28)
BUN: 14 mg/dL (ref 8–27)
Bilirubin Total: 0.4 mg/dL (ref 0.0–1.2)
CO2: 23 mmol/L (ref 20–29)
Calcium: 9.1 mg/dL (ref 8.7–10.3)
Chloride: 107 mmol/L — ABNORMAL HIGH (ref 96–106)
Creatinine, Ser: 0.99 mg/dL (ref 0.57–1.00)
GFR calc Af Amer: 68 mL/min/{1.73_m2} (ref >59)
GFR calc non Af Amer: 59 mL/min/{1.73_m2} — ABNORMAL LOW (ref >59)
Globulin, Total: 2.6 g/dL (ref 1.5–4.5)
Glucose: 98 mg/dL (ref 65–99)
Potassium: 4.4 mmol/L (ref 3.5–5.2)
Sodium: 143 mmol/L (ref 134–144)
Total Protein: 6.8 g/dL (ref 6.0–8.5)

## 2019-11-12 LAB — VITAMIN B12: Vitamin B-12: 299 pg/mL (ref 232–1245)

## 2019-11-12 LAB — AUTOIMMUNE PROFILE
Anti Nuclear Antibody (ANA): NEGATIVE
Complement C3, Serum: 107 mg/dL (ref 82–167)
dsDNA Ab: <1 IU/mL (ref 0–9)

## 2019-11-12 LAB — SEDIMENTATION RATE: Sed Rate: 2 mm/hr (ref 0–40)

## 2019-11-12 LAB — TSH: TSH: 0.942 u[IU]/mL (ref 0.450–4.500)

## 2019-11-13 ENCOUNTER — Encounter: Payer: Self-pay | Admitting: Nurse Practitioner

## 2019-11-21 ENCOUNTER — Ambulatory Visit: Payer: 59 | Admitting: Nurse Practitioner

## 2019-11-28 ENCOUNTER — Ambulatory Visit (INDEPENDENT_AMBULATORY_CARE_PROVIDER_SITE_OTHER): Payer: 59 | Admitting: Nurse Practitioner

## 2019-11-28 ENCOUNTER — Other Ambulatory Visit: Payer: Self-pay

## 2019-11-28 VITALS — BP 132/84 | HR 78 | Temp 98.5°F | Ht 63.6 in | Wt 134.6 lb

## 2019-11-28 DIAGNOSIS — D519 Vitamin B12 deficiency anemia, unspecified: Secondary | ICD-10-CM | POA: Diagnosis not present

## 2019-11-28 MED ORDER — CYANOCOBALAMIN 1000 MCG/ML IJ SOLN
1000.0000 ug | Freq: Once | INTRAMUSCULAR | Status: AC
  Administered 2019-11-28: 1000 ug via INTRAMUSCULAR

## 2019-12-04 ENCOUNTER — Telehealth: Payer: Self-pay | Admitting: Neurology

## 2019-12-04 ENCOUNTER — Other Ambulatory Visit: Payer: Self-pay

## 2019-12-04 ENCOUNTER — Ambulatory Visit: Payer: 59 | Admitting: Neurology

## 2019-12-04 ENCOUNTER — Encounter: Payer: Self-pay | Admitting: Neurology

## 2019-12-04 VITALS — BP 135/86 | HR 71 | Ht 66.0 in | Wt 134.0 lb

## 2019-12-04 DIAGNOSIS — G20 Parkinson's disease: Secondary | ICD-10-CM

## 2019-12-04 DIAGNOSIS — R2689 Other abnormalities of gait and mobility: Secondary | ICD-10-CM | POA: Diagnosis not present

## 2019-12-04 DIAGNOSIS — R269 Unspecified abnormalities of gait and mobility: Secondary | ICD-10-CM | POA: Diagnosis not present

## 2019-12-04 NOTE — Telephone Encounter (Signed)
cigna order sent to [facility]. They will obtain the auth and reach out to the patient to schedule.  °

## 2019-12-04 NOTE — Progress Notes (Signed)
[id number]    Provider:  Dr Jaynee Eagles Requesting Provider: Minette Brine, FNP Primary Care Provider:  Minette Brine, FNP  CC:  Unsteady gait  HPI:  Denise [name] is a 69 y.o. female here as requested by Minette Brine, [place] for trouble ambulating. PMHx CKD, obesity, TB, osteopenia, glaucoma with optic atrophy, B12 deficiency, fatigue, pre-diabetes.  I reviewed Denise [name]'s notes, daughter and nursing has her mom has been "tensing up when walking", stiffness, she has been going to a chiropractor and working with them for 6 months, sometimes she feels like a backpack on her back but no did not anything, someday she does better with water, she is writing smaller, taking longer to get dressed, gait slower, examination was largely unremarkable including cardiovascular, pulmonary, however a neurologic exam Romberg sign was slightly positive, slow to respond with some of her questions, mild cogwheeling to her right wrist.  She is here alone. She reports her best friend noticed her walking was "off" a little, she is doing better since seeing a chiropractor, she reports 3 years ago she was under a lot of stress and she kept to herself and that's when she started noticing symptoms, slowly progressive,, her daughter also noticed. She is a poor historian, she walks slumped over, she is slower than she used to be, she gets in the habit of not picking feet up enough, no tremors, no falls, she does not use a cane, her handwriting is getting smaller, she has always had a soft voise, no vivid dreams,no dizziness or postural symptoms, smell impaired for a long time, about 20 years lost sense of smell, no problem with taste, she wears adult briefs for incontinence, no difficulty swallowing, she has low B12 and she is going to take injections, no constipation, no vision changes. No history of dopamine blocking drugs. No neck pain. No other focal neurologic deficits, associated symptoms, inciting events or  modifiable factors.  Reviewed notes, labs and imaging from outside physicians, which showed:  IMPRESSION: Personally reviewed images from 2014 x-ray lumbar spine and agree with the following  1.  Lateral projection is off axis significantly reducing sensitivity and specificity.  Suspected anterolisthesis at L5-S1. I cannot exclude pars defects at L5.  Lower lumbar facet arthropathy. 2. Prominence of stool throughout the colon suggests constipation. 3.  Expanded left pubic body is suggested on the frontal projection, possibly post-traumatic or from spurring.  Sed rate 2, CMP unremarkable slightly decreased GFR, vitamin B12 was 299 (in the normal range but may still be B12 deficiency), TSH normal, autoimmune panel double-stranded DNA/complement C3/ANA all negative or within normal range. Review of Systems: Patient complains of symptoms per HPI as well as the following symptoms: walking is "off". Pertinent negatives and positives per HPI. All others negative.   Social History   Socioeconomic History  . Marital status: Single    Spouse name: Not on file  . Number of children: 3  . Years of education: Not on file  . Highest education level: Master's degree (e.g., MA, MS, MEng, MEd, MSW, MBA)  Occupational History  . Not on file  Tobacco Use  . Smoking status: Never Smoker  . Smokeless tobacco: Never Used  Substance and Sexual Activity  . Alcohol use: Yes    Comment: Rare  . Drug use: No  . Sexual activity: Yes    Birth control/protection: Post-menopausal  Other Topics Concern  . Not on file  Social History Narrative   Lives alone   Right handed  Caffeine: none   Social Determinants of Radio broadcast assistant Strain:   . Difficulty of Paying Living Expenses:   Food Insecurity:   . Worried About Charity fundraiser in the Last Year:   . Arboriculturist in the Last Year:   Transportation Needs:   . Film/video editor (Medical):   Marland Kitchen Lack of Transportation  (Non-Medical):   Physical Activity:   . Days of Exercise per Week:   . Minutes of Exercise per Session:   Stress:   . Feeling of Stress :   Social Connections:   . Frequency of Communication with Friends and Family:   . Frequency of Social Gatherings with Friends and Family:   . Attends Religious Services:   . Active Member of Clubs or Organizations:   . Attends Archivist Meetings:   Marland Kitchen Marital Status:   Intimate Partner Violence:   . Fear of Current or Ex-Partner:   . Emotionally Abused:   Marland Kitchen Physically Abused:   . Sexually Abused:     Family History  Problem Relation Age of Onset  . Hypertension Mother   . Heart disease Mother   . Diabetes Father   . Hypertension Father   . Other Father        had a condition that was "a cousin" to ALS   . Diabetes Sister   . Tuberculosis Maternal Grandmother   . Heart disease Sister   . Colitis Daughter   . Colon cancer Neg Hx   . Colon polyps Neg Hx   . Esophageal cancer Neg Hx   . Rectal cancer Neg Hx   . Stomach cancer Neg Hx     Past Medical History:  Diagnosis Date  . Chronic kidney disease    as a child, no present issues   . Glaucoma   . Osteopenia 06/2018   T score -1.9 FRAX 3% / 0.2%  . 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 Gait abnormality 12/04/2019  . Prediabetes 12/03/2018  . Vitamin D deficiency 12/03/2018  . Anemia due to vitamin B12 deficiency 12/03/2018  . Fatigue 12/03/2018  . Urinary frequency 10/01/2018  . Vitamin B12 deficiency 10/01/2018  . 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 Osteopenia 08/23/2012    Past Surgical History:  Procedure Laterality Date  . TUBAL LIGATION      Current Outpatient Medications  Medication Sig Dispense Refill  . Cholecalciferol (VITAMIN D3) 10 MCG (400 UNIT) CAPS Take 1 capsule by mouth daily. Pt unaware of  dose    . timolol (BETIMOL) 0.25 % ophthalmic solution 1-2 drops daily.     . Travoprost, BAK Free, (TRAVATAN) 0.004 % SOLN ophthalmic solution 1 drop at bedtime.     No current facility-administered medications for this visit.    Allergies as of 12/04/2019  . (No Known Allergies)    Vitals: BP 135/86 (BP Location: Left Arm, Patient Position: Sitting)   Pulse 71   Ht 5\' 6"  (1.676 m)   Wt 134 lb (60.8 kg)   BMI 21.63 kg/m  Last Weight:  Wt Readings from Last 1 Encounters:  12/04/19 134 lb (60.8 kg)   Last Height:   Ht Readings from Last 1 Encounters:  12/04/19 5\' 6"  (1.676 m)     Physical exam: Exam: Gen: NAD, conversant, thin and appears  slightly rail            CV: RRR, no MRG. No Carotid Bruits. + peripheral edema distally, warm, nontender Eyes: Conjunctivae clear without exudates or hemorrhage  Neuro: Detailed Neurologic Exam  Speech:    Speech is normal; fluent and spontaneous with normal comprehension.  Cognition:    The patient is oriented to person, month, year, name of location     recent and remote memory impaired    language fluent;     normal attention, concentration,     fund of knowledge: (knows president, VP), overall may be decreased slightly Cranial Nerves:    The pupils are equal, round, and reactive to light.Attempted fundoscopy could not visulize due to small pupils.  Visual fields are full to finger confrontation. Extraocular movements are intact. Trigeminal sensation is intact and the muscles of mastication are normal. The face is symmetric. Hypomimia. The palate elevates in the midline. Hearing intact to voice. Voice is nypophonic. Shoulder shrug is normal. The tongue has normal motion without fasciculations.   Coordination:    Normal finger to nose without dysmetria or ataxia but bradykinetic,  rapid alternating movements not significantly impaired  Gait:    Stooped, reduced arm swing, slightly shuffling low clearance and narrow gait.    Motor Observation:    Postural tremor, high frequency low amplitude Tone:    Possibly some very minimal cogwheeling right arm with left hand facilitation  Posture:    Posture is normal. normal erect    Strength:    Strength is V/V in the upper and lower limbs.      Sensation: intact to LT     Reflex Exam:  DTR's:    Deep tendon reflexes in the upper and lower extremities are slightly brisk bilaterally.   Toes:    The toes are equivocal bilaterally.   Clonus:    Clonus is absent.    Assessment/Plan:  69 y.o. female here as requested by Minette Brine, FNP for trouble ambulating. PMHx CKD, obesity, TB, osteopenia, glaucoma with optic atrophy, B12 deficiency, fatigue, pre-diabetes.  Patient with hypophonia, hypokalemia, micrographia, shuffling gait, decreased arm swing, stooped posture, bradykinetic, suspect disorder in the Parkinson's disease spectrum(idiopathic PD, lewy body etc)  or possibly NPH or vascular dementia causing gait apraxia.  I discussed with patient, we will order an MRI of the brain with and without contrast as well as a DaTscan.  MRI of the brain w/wo contrast to evaluate for causes of parkinsonism such as vascular disease or NPH NM DAT Scan to evaluate for Parkinson's Disease vs other cause of symptoms such as previous medication use (no hx as far as she know of dopamine blocking drugs but she is a poor historian and here alone), she also has a postural tremor (essential tremor?) not a resting tremor.  Discussed fall risks and precautions  Will see her back in 3-4 months and will schedule an extended appointment to review findings. Recommend she come with daughter.  Orders Placed This Encounter  Procedures  . MR BRAIN W WO CONTRAST  . NM BRAIN DATSCAN TUMOR LOC INFLAM SPECT 1 DAY   Cc: Minette Brine, FNP,  Minette Brine, FNP  Sarina Ill, [facility] [address] [place], [place] [address]  Phone [phone] Fax  [phone]

## 2019-12-04 NOTE — Patient Instructions (Addendum)
MRI of the brain to examine the brain structure DAT Scan to evaluate for Parkinson's Disease   Fall Prevention in the Home, Adult Falls can cause injuries. They can happen to people of all ages. There are many things you can do to make your home safe and to help prevent falls. Ask for help when making these changes, if needed. What actions can I take to prevent falls? General Instructions  Use good lighting in all rooms. Replace any light bulbs that burn out.  Turn on the lights when you go into a dark area. Use night-lights.  Keep items that you use often in easy-to-reach places. Lower the shelves around your home if necessary.  Set up your furniture so you have a clear path. Avoid moving your furniture around.  Do not have throw rugs and other things on the floor that can make you trip.  Avoid walking on wet floors.  If any of your floors are uneven, fix them.  Add color or contrast paint or tape to clearly mark and help you see: ? Any grab bars or handrails. ? First and last steps of stairways. ? Where the edge of each step is.  If you use a stepladder: ? Make sure that it is fully opened. Do not climb a closed stepladder. ? Make sure that both sides of the stepladder are locked into place. ? Ask someone to hold the stepladder for you while you use it.  If there are any pets around you, be aware of where they are. What can I do in the bathroom?      Keep the floor dry. Clean up any water that spills onto the floor as soon as it happens.  Remove soap buildup in the tub or shower regularly.  Use non-skid mats or decals on the floor of the tub or shower.  Attach bath mats securely with double-sided, non-slip rug tape.  If you need to sit down in the shower, use a plastic, non-slip stool.  Install grab bars by the toilet and in the tub and shower. Do not use towel bars as grab bars. What can I do in the bedroom?  Make sure that you have a light by your bed that is  easy to reach.  Do not use any sheets or blankets that are too big for your bed. They should not hang down onto the floor.  Have a firm chair that has side arms. You can use this for support while you get dressed. What can I do in the kitchen?  Clean up any spills right away.  If you need to reach something above you, use a strong step stool that has a grab bar.  Keep electrical cords out of the way.  Do not use floor polish or wax that makes floors slippery. If you must use wax, use non-skid floor wax. What can I do with my stairs?  Do not leave any items on the stairs.  Make sure that you have a light switch at the top of the stairs and the bottom of the stairs. If you do not have them, ask someone to add them for you.  Make sure that there are handrails on both sides of the stairs, and use them. Fix handrails that are broken or loose. Make sure that handrails are as long as the stairways.  Install non-slip stair treads on all stairs in your home.  Avoid having throw rugs at the top or bottom of the stairs. If you  do have throw rugs, attach them to the floor with carpet tape.  Choose a carpet that does not hide the edge of the steps on the stairway.  Check any carpeting to make sure that it is firmly attached to the stairs. Fix any carpet that is loose or worn. What can I do on the outside of my home?  Use bright outdoor lighting.  Regularly fix the edges of walkways and driveways and fix any cracks.  Remove anything that might make you trip as you walk through a door, such as a raised step or threshold.  Trim any bushes or trees on the path to your home.  Regularly check to see if handrails are loose or broken. Make sure that both sides of any steps have handrails.  Install guardrails along the edges of any raised decks and porches.  Clear walking paths of anything that might make someone trip, such as tools or rocks.  Have any leaves, snow, or ice cleared  regularly.  Use sand or salt on walking paths during winter.  Clean up any spills in your garage right away. This includes grease or oil spills. What other actions can I take?  Wear shoes that: ? Have a low heel. Do not wear high heels. ? Have rubber bottoms. ? Are comfortable and fit you well. ? Are closed at the toe. Do not wear open-toe sandals.  Use tools that help you move around (mobility aids) if they are needed. These include: ? Canes. ? Walkers. ? Scooters. ? Crutches.  Review your medicines with your doctor. Some medicines can make you feel dizzy. This can increase your chance of falling. Ask your doctor what other things you can do to help prevent falls. Where to find more information  Centers for Disease Control and Prevention, STEADI: https://garcia.biz/  [employer] on Aging: BrainJudge.co.uk Contact a doctor if:  You are afraid of falling at home.  You feel weak, drowsy, or dizzy at home.  You fall at home. Summary  There are many simple things that you can do to make your home safe and to help prevent falls.  Ways to make your home safe include removing tripping hazards and installing grab bars in the bathroom.  Ask for help when making these changes in your home. This information is not intended to replace advice given to you by your health care provider. Make sure you discuss any questions you have with your health care provider. Document Revised: 10/11/2018 Document Reviewed: 02/02/2017 Elsevier Patient Education  2020 [employer].

## 2019-12-05 ENCOUNTER — Ambulatory Visit: Payer: 59 | Admitting: Nurse Practitioner

## 2019-12-05 NOTE — Telephone Encounter (Signed)
Novella RobAY:[id number] (exp. 12/05/19 to 03/04/20)

## 2019-12-06 ENCOUNTER — Encounter: Payer: Self-pay | Admitting: Nurse Practitioner

## 2019-12-06 ENCOUNTER — Telehealth: Payer: Self-pay | Admitting: Neurology

## 2019-12-06 ENCOUNTER — Ambulatory Visit
Admission: RE | Admit: 2019-12-06 | Discharge: 2019-12-06 | Disposition: A | Discharge status: Home / Self Care | Payer: 59 | Source: Ambulatory Visit | Attending: Neurology | Admitting: Neurology

## 2019-12-06 DIAGNOSIS — G20 Parkinson's disease: Secondary | ICD-10-CM | POA: Diagnosis not present

## 2019-12-06 MED ORDER — GADOBENATE DIMEGLUMINE 529 MG/ML IV SOLN
12.0000 mL | Freq: Once | INTRAVENOUS | Status: AC | PRN
  Administered 2019-12-06: 12 mL via INTRAVENOUS

## 2019-12-06 NOTE — Progress Notes (Signed)
  Subjective:     Patient ID: Denise [name] , female    DOB: 08-19-1950 , 70 y.o.   MRN: [id number]   No chief complaint on file.   HPI  She is here today for her 1st of 4 weekly Vitamin B12 injection. She is to see Neurology for evaluation of her gait    Past Medical History:  Diagnosis Date  . Chronic kidney disease    as a child, no present issues   . Glaucoma   . Osteopenia 06/2018   T score -1.9 FRAX 3% / 0.2%  . 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 Hypertension Mother   . Heart disease Mother   . Diabetes Father   . Hypertension Father   . Other Father        had a condition that was "a cousin" to ALS   . Diabetes Sister   . Tuberculosis Maternal Grandmother   . Heart disease Sister   . Colitis Daughter   . Colon cancer Neg Hx   . Colon polyps Neg Hx   . Esophageal cancer Neg Hx   . Rectal cancer Neg Hx   . Stomach cancer Neg Hx      Current Outpatient Medications:  .  Cholecalciferol (VITAMIN D3) 10 MCG (400 UNIT) CAPS, Take 1 capsule by mouth daily. Pt unaware of dose, Disp: , Rfl:  .  timolol (BETIMOL) 0.25 % ophthalmic solution, 1-2 drops daily. , Disp: , Rfl:  .  Travoprost, BAK Free, (TRAVATAN) 0.004 % SOLN ophthalmic solution, 1 drop at bedtime., Disp: , Rfl:    No Known Allergies   Review of Systems  Constitutional: Negative for fatigue.  Respiratory: Negative.  Negative for cough.   Cardiovascular: Negative.  Negative for chest pain, palpitations and leg swelling.  Endocrine: Negative for polydipsia, polyphagia and polyuria.  Musculoskeletal: Negative.      Today's Vitals   11/28/19 1149  BP: 132/84  Pulse: 78  Temp: 98.5 F (36.9 C)  TempSrc: Oral  SpO2: 94%  Weight: 134 lb 9.6 oz (61.1 kg)  Height: 5' 3.6" (1.615 m)   Body mass index is 23.4 kg/m.   Objective:  Physical Exam Vitals reviewed.  Constitutional:      Appearance: Normal appearance.  Cardiovascular:     Rate and Rhythm:  Normal rate and regular rhythm.     Pulses: Normal pulses.     Heart sounds: Normal heart sounds. No murmur.  Pulmonary:     Effort: Pulmonary effort is normal.     Breath sounds: Normal breath sounds.  Skin:    General: Skin is warm and dry.     Capillary Refill: Capillary refill takes less than 2 seconds.  Neurological:     General: No focal deficit present.     Mental Status: She is alert and oriented to person, place, and time.  Psychiatric:        Mood and Affect: Mood normal.        Behavior: Behavior normal.        Thought Content: Thought content normal.        Judgment: Judgment normal.         Assessment And Plan:     1. Vitamin B12 deficiency  This is her second time around getting vitamin B12 injections, she is having fatigue   She will return in one week    Minette Brine, FNP

## 2019-12-06 NOTE — Telephone Encounter (Signed)
Pt has called stating Dr Jaynee Eagles told her that she would call in something to help her relax for her upcoming MRI.  Pt received a call with an opportunity to have her MRI this afternoon.  Pt is asking if On call Dr can call in something for her to take for the afternoon MRI today. Pt uses [id number] , please call

## 2019-12-07 MED ORDER — ALPRAZOLAM 0.5 MG PO TABS
ORAL_TABLET | ORAL | 0 refills | Status: DC
Start: 2019-12-07 — End: 2019-12-30

## 2019-12-07 NOTE — Addendum Note (Signed)
Addended by: Marcial Pacas on: 12/07/2019 09:52 AM   Modules accepted: Orders

## 2019-12-07 NOTE — Telephone Encounter (Signed)
I erx xanax 0.5mg  3 tablets.  Take 1-2 tablets 30 minutes prior to MRI, may repeat once as needed. Must have driver.

## 2019-12-10 ENCOUNTER — Encounter: Payer: Self-pay | Admitting: Nurse Practitioner

## 2019-12-10 ENCOUNTER — Telehealth: Payer: Self-pay | Admitting: *Deleted

## 2019-12-10 ENCOUNTER — Ambulatory Visit (INDEPENDENT_AMBULATORY_CARE_PROVIDER_SITE_OTHER): Payer: 59 | Admitting: Nurse Practitioner

## 2019-12-10 ENCOUNTER — Other Ambulatory Visit: Payer: Self-pay

## 2019-12-10 VITALS — BP 130/72 | HR 82 | Temp 98.2°F | Ht 63.6 in | Wt 131.8 lb

## 2019-12-10 DIAGNOSIS — R609 Edema, unspecified: Secondary | ICD-10-CM

## 2019-12-10 DIAGNOSIS — D519 Vitamin B12 deficiency anemia, unspecified: Secondary | ICD-10-CM

## 2019-12-10 MED ORDER — CYANOCOBALAMIN 1000 MCG/ML IJ SOLN
1000.0000 ug | Freq: Once | INTRAMUSCULAR | Status: AC
  Administered 2019-12-10: 1000 ug via INTRAMUSCULAR

## 2019-12-10 NOTE — Telephone Encounter (Signed)
Spoke with pt and discussed MRI results per Dr. Brett Fairy indicating no acute findings. Described findings consistent with age but we did see general atrophy most noted in the temporal lobes. Pt aware the DAT scan Josem Kaufmann will be worked on by Hinton Dyer and we will follow up in October with Dr. Jaynee Eagles. Pt verbalized appreciation and understanding. Her questions were answered.

## 2019-12-10 NOTE — Patient Instructions (Signed)
Edema  Edema is when you have too much fluid in your body or under your skin. Edema may make your legs, feet, and ankles swell up. Swelling is also common in looser tissues, like around your eyes. This is a common condition. It gets more common as you get older. There are many possible causes of edema. Eating too much salt (sodium) and being on your feet or sitting for a long time can cause edema in your legs, feet, and ankles. Hot weather may make edema worse. Edema is usually painless. Your skin may look swollen or shiny. Follow these instructions at home:  Keep the swollen body part raised (elevated) above the level of your heart when you are sitting or lying down.  Do not sit still or stand for a long time.  Do not wear tight clothes. Do not wear garters on your upper legs.  Exercise your legs. This can help the swelling go down.  Wear elastic bandages or support stockings as told by your doctor.  Eat a low-salt (low-sodium) diet to reduce fluid as told by your doctor.  Depending on the cause of your swelling, you may need to limit how much fluid you drink (fluid restriction).  Take over-the-counter and prescription medicines only as told by your doctor. Contact a doctor if:  Treatment is not working.  You have heart, liver, or kidney disease and have symptoms of edema.  You have sudden and unexplained weight gain. Get help right away if:  You have shortness of breath or chest pain.  You cannot breathe when you lie down.  You have pain, redness, or warmth in the swollen areas.  You have heart, liver, or kidney disease and get edema all of a sudden.  You have a fever and your symptoms get worse all of a sudden. Summary  Edema is when you have too much fluid in your body or under your skin.  Edema may make your legs, feet, and ankles swell up. Swelling is also common in looser tissues, like around your eyes.  Raise (elevate) the swollen body part above the level of your  heart when you are sitting or lying down.  Follow your doctor's instructions about diet and how much fluid you can drink (fluid restriction). This information is not intended to replace advice given to you by your health care provider. Make sure you discuss any questions you have with your health care provider. Document Revised: 06/23/2017 Document Reviewed: 07/08/2016 Elsevier Patient Education  2020 Elsevier Inc.  

## 2019-12-10 NOTE — Telephone Encounter (Signed)
-----   Message from Larey Seat, MD sent at 12/10/2019 12:47 PM EDT ----- This MRI of the brain with and without contrast shows the following: 1.   Some scattered T2/FLAIR hyperintense foci in the subcortical and deep white matter of the hemispheres.  This is nonspecific but most likely represents very mild chronic microvascular ischemic change. 2.   Mild generalized cortical atrophy most noted in the medial temporal lobes. 3.   There are no acute findings and there is a normal enhancement pattern.   INTERPRETING PHYSICIAN:  Richard A. Felecia Shelling, MD, PhD, Charlynn Grimes

## 2019-12-10 NOTE — Progress Notes (Signed)
Subjective:     Patient ID: Denise [name] , female    DOB: [birth year] , 69 y.o.   MRN: [id number]   Chief Complaint  Patient presents with  . B12 Injection    patient presents today for her second dose of vitamin B12  . MRI Results    patient had her a MRI done on Friday.     HPI  She is here today for her 2nd of 4 weekly Vitamin B12 injection, she can feel an improvement in her energy and maybe a little in her memory. She is here today with her daughter. She is also awaiting the results of Dr. Jaynee Eagles    Past Medical History:  Diagnosis Date  . Chronic kidney disease    as a child, no present issues   . Glaucoma   . Osteopenia 06/2018   T score -1.9 FRAX 3% / 0.2%  . 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 Hypertension Mother   . Heart disease Mother   . Diabetes Father   . Hypertension Father   . Other Father        had a condition that was "a cousin" to ALS   . Diabetes Sister   . Tuberculosis Maternal Grandmother   . Heart disease Sister   . Colitis Daughter   . Colon cancer Neg Hx   . Colon polyps Neg Hx   . Esophageal cancer Neg Hx   . Rectal cancer Neg Hx   . Stomach cancer Neg Hx      Current Outpatient Medications:  .  Cholecalciferol (VITAMIN D3) 10 MCG (400 UNIT) CAPS, Take 1 capsule by mouth daily. Pt unaware of dose, Disp: , Rfl:  .  timolol (BETIMOL) 0.25 % ophthalmic solution, 1-2 drops daily. , Disp: , Rfl:  .  Travoprost, BAK Free, (TRAVATAN) 0.004 % SOLN ophthalmic solution, 1 drop at bedtime., Disp: , Rfl:  .  ALPRAZolam (XANAX) 0.5 MG tablet, Take 1-2 tablets 30 minutes prior to MRI, may repeat once as needed. Must have driver. (Patient not taking: Reported on 12/10/2019), Disp: 3 tablet, Rfl: 0   No Known Allergies   Review of Systems  Constitutional: Negative for fatigue.  Respiratory: Negative.  Negative for cough.   Cardiovascular: Negative.  Negative for chest pain, palpitations and leg swelling.   Endocrine: Negative for polydipsia, polyphagia and polyuria.  Musculoskeletal: Negative.   Psychiatric/Behavioral: Negative.      Today's Vitals   12/10/19 0910  BP: 130/72  Pulse: 82  Temp: 98.2 F (36.8 C)  Weight: 131 lb 12.8 oz (59.8 kg)  Height: 5' 3.6" (1.615 m)  PainSc: 0-No pain   Body mass index is 22.91 kg/m.   Objective:  Physical Exam Vitals reviewed.  Constitutional:      Appearance: Normal appearance.     Comments: thin  Cardiovascular:     Rate and Rhythm: Normal rate and regular rhythm.     Pulses: Normal pulses.     Heart sounds: Normal heart sounds. No murmur.  Pulmonary:     Effort: Pulmonary effort is normal.     Breath sounds: Normal breath sounds.  Musculoskeletal:     Comments: Gait remains slow and steady with her daughter by her side  Skin:    General: Skin is warm and dry.     Capillary Refill: Capillary refill takes less than 2 seconds.  Neurological:     General: No  focal deficit present.     Mental Status: She is alert and oriented to person, place, and time.  Psychiatric:        Mood and Affect: Mood normal.        Behavior: Behavior normal.        Thought Content: Thought content normal.        Judgment: Judgment normal.         Assessment And Plan:     1. Anemia due to vitamin B12 deficiency, unspecified B12 deficiency type  She had #2 of 4 weekly vitamin B12 injections  She will return on 6/21 when I return from [place] - cyanocobalamin ((VITAMIN B-12)) injection 1,000 mcg  2. Edema, unspecified type  She has 1+ non pitting edema  Advised to elevate when possible and wear support socks  She is to also increase her water intake  Limit salt intake.    Minette Brine, FNP

## 2019-12-10 NOTE — Progress Notes (Signed)
This MRI of the brain with and without contrast shows the following: 1.   Some scattered T2/FLAIR hyperintense foci in the subcortical and deep white matter of the hemispheres.  This is nonspecific but most likely represents very mild chronic microvascular ischemic change. 2.   Mild generalized cortical atrophy most noted in the medial temporal lobes. 3.   There are no acute findings and there is a normal enhancement pattern.   INTERPRETING PHYSICIAN:  Richard A. Felecia Shelling, MD, PhD, Charlynn Grimes

## 2019-12-17 ENCOUNTER — Encounter: Payer: Self-pay | Admitting: Nurse Practitioner

## 2019-12-23 ENCOUNTER — Other Ambulatory Visit: Payer: Self-pay

## 2019-12-23 ENCOUNTER — Encounter: Payer: Self-pay | Admitting: Nurse Practitioner

## 2019-12-23 ENCOUNTER — Ambulatory Visit (INDEPENDENT_AMBULATORY_CARE_PROVIDER_SITE_OTHER): Payer: 59 | Admitting: Nurse Practitioner

## 2019-12-23 VITALS — BP 116/80 | HR 62 | Temp 98.5°F | Ht 63.6 in | Wt 132.4 lb

## 2019-12-23 DIAGNOSIS — R601 Generalized edema: Secondary | ICD-10-CM | POA: Diagnosis not present

## 2019-12-23 DIAGNOSIS — D519 Vitamin B12 deficiency anemia, unspecified: Secondary | ICD-10-CM | POA: Diagnosis not present

## 2019-12-23 DIAGNOSIS — R21 Rash and other nonspecific skin eruption: Secondary | ICD-10-CM | POA: Diagnosis not present

## 2019-12-23 MED ORDER — NYSTATIN-TRIAMCINOLONE 100000-0.1 UNIT/GM-% EX OINT: 1.0000 "application " | TOPICAL_OINTMENT | Freq: Two times a day (BID) | CUTANEOUS | 0 refills | Status: DC

## 2019-12-23 MED ORDER — CYANOCOBALAMIN 1000 MCG/ML IJ SOLN
1000.0000 ug | Freq: Once | INTRAMUSCULAR | Status: AC
  Administered 2019-12-23: 1000 ug via INTRAMUSCULAR

## 2019-12-23 NOTE — Patient Instructions (Signed)
   Elevate your feet when sitting for long periods  Wear support socks during the day remove at night.

## 2019-12-23 NOTE — Progress Notes (Addendum)
Subjective:     Patient ID: Denise [name] , female    DOB: 07/07/1950 , 69 y.o.   MRN: [id number]   Chief Complaint  Patient presents with  . B12 Injection    patient presents today for her 3rd injection    HPI  She is here today for her 3rd weekly Vitamin B12 injection. She states that she notices some changes in memory and energy level. She got the report for her MRI from the radiologist and they stated that the scan looked normal. She is going to have an addition l test and is awaiting insurance and scheduling. She is walking in her driveway and tolerating the walking fine. She is elevating her legs at night and during the day and is encouraged to drink waters throughout the day. She continues to have some swelling in her legs.     Past Medical History:  Diagnosis Date  . Chronic kidney disease    as a child, no present issues   . Glaucoma   . Osteopenia 06/2018   T score -1.9 FRAX 3% / 0.2%  . 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 Hypertension Mother   . Heart disease Mother   . Diabetes Father   . Hypertension Father   . Other Father        had a condition that was "a cousin" to ALS   . Diabetes Sister   . Tuberculosis Maternal Grandmother   . Heart disease Sister   . Colitis Daughter   . Colon cancer Neg Hx   . Colon polyps Neg Hx   . Esophageal cancer Neg Hx   . Rectal cancer Neg Hx   . Stomach cancer Neg Hx      Current Outpatient Medications:  .  Cholecalciferol (VITAMIN D3) 10 MCG (400 UNIT) CAPS, Take 1 capsule by mouth daily. Pt unaware of dose, Disp: , Rfl:  .  timolol (BETIMOL) 0.25 % ophthalmic solution, 1-2 drops daily. , Disp: , Rfl:  .  Travoprost, BAK Free, (TRAVATAN) 0.004 % SOLN ophthalmic solution, 1 drop at bedtime., Disp: , Rfl:  .  vitamin B-12 (CYANOCOBALAMIN) 50 MCG tablet, Take 50 mcg by mouth daily., Disp: , Rfl:  .  ALPRAZolam (XANAX) 0.5 MG tablet, Take 1-2 tablets 30 minutes prior to MRI, may  repeat once as needed. Must have driver. (Patient not taking: Reported on 12/10/2019), Disp: 3 tablet, Rfl: 0  Current Facility-Administered Medications:  .  cyanocobalamin ((VITAMIN B-12)) injection 1,000 mcg, 1,000 mcg, Intramuscular, Once, Minette Brine, FNP   No Known Allergies   Review of Systems  Respiratory: Negative.   Cardiovascular: Positive for leg swelling. Negative for chest pain and palpitations.  Gastrointestinal: Negative.   Endocrine: Negative for polydipsia, polyphagia and polyuria.  Genitourinary: Negative.   Musculoskeletal: Negative.   Skin: Negative.   Allergic/Immunologic: Negative.   Neurological: Negative.  Negative for tremors, speech difficulty and weakness.  Hematological: Negative.   Psychiatric/Behavioral: Negative.      Today's Vitals   12/23/19 1148  BP: 116/80  Pulse: 62  Temp: 98.5 F (36.9 C)  TempSrc: Oral  Weight: 132 lb 6.4 oz (60.1 kg)  Height: 5' 3.6" (1.615 m)  PainSc: 0-No pain   Body mass index is 23.01 kg/m.   Objective:  Physical Exam Vitals reviewed.  Constitutional:      General: She is not in acute distress.    Appearance: Normal appearance.  Comments: thin  Cardiovascular:     Rate and Rhythm: Normal rate and regular rhythm.     Pulses: Normal pulses.     Heart sounds: Normal heart sounds. No murmur heard.   Pulmonary:     Effort: Pulmonary effort is normal. No respiratory distress.     Breath sounds: Normal breath sounds.  Musculoskeletal:     Right lower leg: Edema (trace - 1+) present.     Left lower leg: Edema (trace - 1+) present.  Skin:    General: Skin is warm and dry.     Capillary Refill: Capillary refill takes less than 2 seconds.     Findings: Rash (lower extremity with erythema area) present.  Neurological:     General: No focal deficit present.     Mental Status: She is alert and oriented to person, place, and time.     Cranial Nerves: No cranial nerve deficit.  Psychiatric:        Mood and  Affect: Mood normal.        Behavior: Behavior normal.        Thought Content: Thought content normal.        Judgment: Judgment normal.         Assessment And Plan:     1. Anemia due to vitamin B12 deficiency, unspecified B12 deficiency type  Doing well with vitamin B12 injection, this is her 3rd one for weekly  Will check levels next visit - cyanocobalamin ((VITAMIN B-12)) injection 1,000 mcg  2. Rash and nonspecific skin eruption  Erythematous rash to her lower extremity  - nystatin-triamcinolone ointment (MYCOLOG); Apply 1 application topically 2 (two) times daily.  Dispense: 30 g; Refill: 0  3. Generalized edema  Bilateral lower extremity edema trace - 1+, this is slightly improved since her last visit.  I have advised her again to wear support socks throughout the day and take off when going to bed      Marylu Lund, RN    Minette Brine, DNP, FNP-BC

## 2019-12-24 ENCOUNTER — Telehealth: Payer: Self-pay | Admitting: Neurology

## 2019-12-24 NOTE — Telephone Encounter (Signed)
MRI results sent to PCP's in-basket Othelia Pulling NP).

## 2019-12-24 NOTE — Telephone Encounter (Signed)
Pt is asking that once the results to her MRI are available that the results please be sent to her PCP Dr Minette Brine

## 2019-12-30 ENCOUNTER — Encounter: Payer: Self-pay | Admitting: Nurse Practitioner

## 2019-12-30 ENCOUNTER — Ambulatory Visit (INDEPENDENT_AMBULATORY_CARE_PROVIDER_SITE_OTHER): Payer: 59 | Admitting: Nurse Practitioner

## 2019-12-30 ENCOUNTER — Other Ambulatory Visit: Payer: Self-pay

## 2019-12-30 VITALS — BP 120/62 | HR 60 | Temp 98.1°F | Ht 64.8 in | Wt 130.2 lb

## 2019-12-30 DIAGNOSIS — D519 Vitamin B12 deficiency anemia, unspecified: Secondary | ICD-10-CM

## 2019-12-30 DIAGNOSIS — R601 Generalized edema: Secondary | ICD-10-CM | POA: Diagnosis not present

## 2019-12-30 DIAGNOSIS — L03119 Cellulitis of unspecified part of limb: Secondary | ICD-10-CM

## 2019-12-30 MED ORDER — CEPHALEXIN 500 MG PO CAPS: 500.0000 mg | ORAL_CAPSULE | Freq: Four times a day (QID) | ORAL | 0 refills | Status: DC

## 2019-12-30 MED ORDER — HYDROCHLOROTHIAZIDE 12.5 MG PO TABS: 12.5000 mg | ORAL_TABLET | Freq: Every day | ORAL | 2 refills | Status: DC

## 2019-12-30 MED ORDER — TRIAMCINOLONE ACETONIDE 0.5 % EX OINT: 1.0000 "application " | TOPICAL_OINTMENT | Freq: Two times a day (BID) | CUTANEOUS | 0 refills | Status: DC

## 2019-12-30 MED ORDER — CYANOCOBALAMIN 1000 MCG/ML IJ SOLN
1000.0000 ug | Freq: Once | INTRAMUSCULAR | Status: AC
  Administered 2019-12-30: 1000 ug via INTRAMUSCULAR

## 2019-12-30 NOTE — Progress Notes (Signed)
Subjective:     Patient ID: Denise [name] , female    DOB: 04/19/1951 , 69 y.o.   MRN: [id number]   Chief Complaint  Patient presents with   B12 Injection   Ankle Pain    HPI  She is here today for her 4th weekly Vitamin B12 injection. She states that she notices some changes in memory and energy level. She has a very edematous and weeping rash that erupted after she used the new cream. She does report using apple cider vinegar on the areas and  also used a burn that she cannot name. She does report that her memory is improving and activity level is better.     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 Pt unaware of dose, Disp: , Rfl:    nystatin-triamcinolone ointment (MYCOLOG), Apply 1 application topically 2 (two) times daily., Disp: 30 g, Rfl: 0   timolol (BETIMOL) 0.25 % ophthalmic solution, 1-2 drops daily. , Disp: , Rfl:    Travoprost, BAK Free, (TRAVATAN) 0.004 % SOLN ophthalmic solution, 1 drop at bedtime., Disp: , Rfl:    vitamin B-12 (CYANOCOBALAMIN) 50 MCG tablet, Take 50 mcg by mouth daily., Disp: , Rfl:   Current Facility-Administered Medications:    cyanocobalamin ((VITAMIN B-12)) injection 1,000 mcg, 1,000 mcg,  Intramuscular, Once, Minette Brine, FNP   No Known Allergies   Review of Systems  Constitutional: Negative for fatigue.  Respiratory: Negative.   Cardiovascular: Positive for leg swelling. Negative for chest pain and palpitations.  Gastrointestinal: Negative.   Genitourinary: Negative.   Musculoskeletal: Negative.  Negative for arthralgias and gait problem.  Skin: Positive for color change and rash.       Edematous and weeping rash on bilateral feet and ankles.  Allergic/Immunologic: Negative.   Neurological: Negative.  Negative for tremors, speech difficulty and weakness.  Hematological: Negative.   Psychiatric/Behavioral: Negative.      Today's Vitals   12/30/19 1159  BP: 120/62  Pulse: 60  Temp: 98.1 F (36.7 C)  Weight: 130 lb 3.2 oz (59.1 kg)  Height: 5' 4.8" (1.646 m)  PainSc: 0-No pain   Body mass index is 21.8 kg/m.   Objective:  Physical Exam Vitals reviewed.  Constitutional:      Appearance: Normal appearance.     Comments: thin  Cardiovascular:     Rate and Rhythm: Normal rate and regular rhythm.     Pulses: Normal pulses.  Heart sounds: Normal heart sounds. No murmur heard.   Pulmonary:     Effort: Pulmonary effort is normal. No respiratory distress.     Breath sounds: Normal breath sounds.  Musculoskeletal:     Right lower leg: Edema (trace - 1+) present.     Left lower leg: Edema (trace - 1+) present.  Skin:    General: Skin is warm and dry.     Capillary Refill: Capillary refill takes less than 2 seconds.     Findings: Lesion and rash (lower extremity with erythema area) present. Rash is crusting, pustular and vesicular.     Comments: Weeping erythematous rash  Neurological:     General: No focal deficit present.     Mental Status: She is alert and oriented to person, place, and time.     Cranial Nerves: No cranial nerve deficit.  Psychiatric:        Mood and Affect: Mood normal.        Behavior: Behavior normal.        Thought Content:  Thought content normal.        Judgment: Judgment normal.         Assessment And Plan:   1. Anemia due to vitamin B12 deficiency, unspecified B12 deficiency type  Will check vitamin B12 levels today and administer injection  Memory and fatigue improving - cyanocobalamin ((VITAMIN B-12)) injection 1,000 mcg - Vitamin B12  2. Cellulitis of lower extremity, unspecified laterality  Bilateral lower extremities with erythema present up to bottom of calves  Will treat with triamcinolone cream  3. Generalized edema  She is to continue to keep her feet elevated and wear support socks for a few hours per day as tolerated.     Marylu Lund, RN    Minette Brine, DNP, FNP-BC

## 2019-12-30 NOTE — Patient Instructions (Addendum)
   Cleanse legs with warm dial soap and water by patting the area one to two times   Do not shower to aggravate the blisters  Apply cream only to reddened area.   Elevate your feet over the next few days.

## 2019-12-31 LAB — VITAMIN B12: Vitamin B-12: 908 pg/mL (ref 232–1245)

## 2020-01-02 ENCOUNTER — Ambulatory Visit (INDEPENDENT_AMBULATORY_CARE_PROVIDER_SITE_OTHER): Payer: 59 | Admitting: Nurse Practitioner

## 2020-01-02 ENCOUNTER — Other Ambulatory Visit: Payer: Self-pay

## 2020-01-02 ENCOUNTER — Encounter: Payer: Self-pay | Admitting: Nurse Practitioner

## 2020-01-02 VITALS — BP 120/72 | HR 68 | Temp 98.1°F | Ht 64.8 in | Wt 130.3 lb

## 2020-01-02 DIAGNOSIS — R238 Other skin changes: Secondary | ICD-10-CM

## 2020-01-02 DIAGNOSIS — L03119 Cellulitis of unspecified part of limb: Secondary | ICD-10-CM | POA: Diagnosis not present

## 2020-01-02 MED ORDER — CEFTRIAXONE SODIUM 1 G IJ SOLR
1.0000 g | Freq: Once | INTRAMUSCULAR | Status: AC
  Administered 2020-01-02: 1 g via INTRAMUSCULAR

## 2020-01-02 NOTE — Progress Notes (Signed)
This visit occurred during the SARS-CoV-2 public health emergency.  Safety protocols were in place, including screening questions prior to the visit, additional usage of staff PPE, and extensive cleaning of exam room while observing appropriate contact time as indicated for disinfecting solutions.  Subjective:     Patient ID: Denise [name] , female    DOB: [birth year] , 69 y.o.   MRN: [id number]   Chief Complaint  Patient presents with  . Recurrent Skin Infections    HPI  Here to have her legs rechecked which have become worse. Now she has worsening redness going up the leg and more blisters. She also has itching present  She is here today with her daughter.      Past Medical History:  Diagnosis Date  . Chronic kidney disease    as a child, no present issues   . Glaucoma   . Osteopenia 06/2018   T score -1.9 FRAX 3% / 0.2%  . 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 Hypertension Mother   . Heart disease Mother   . Diabetes Father   . Hypertension Father   . Other Father        had a condition that was "a cousin" to ALS   . Diabetes Sister   . Tuberculosis Maternal Grandmother   . Heart disease Sister   . Colitis Daughter   . Colon cancer Neg Hx   . Colon polyps Neg Hx   . Esophageal cancer Neg Hx   . Rectal cancer Neg Hx   . Stomach cancer Neg Hx      Current Outpatient Medications:  .  cephALEXin (KEFLEX) 500 MG capsule, Take 1 capsule (500 mg total) by mouth 4 (four) times daily for 10 days., Disp: 40 capsule, Rfl: 0 .  Cholecalciferol (VITAMIN D3) 10 MCG (400 UNIT) CAPS, Take 1 capsule by mouth daily. Pt unaware of dose, Disp: , Rfl:  .  hydrochlorothiazide (HYDRODIURIL) 12.5 MG tablet, Take 1 tablet (12.5 mg total) by mouth daily., Disp: 30 tablet, Rfl: 2 .  nystatin-triamcinolone ointment (MYCOLOG), Apply 1 application topically 2 (two) times daily., Disp: 30 g, Rfl: 0 .  timolol (BETIMOL) 0.25 % ophthalmic solution,  1-2 drops daily. , Disp: , Rfl:  .  Travoprost, BAK Free, (TRAVATAN) 0.004 % SOLN ophthalmic solution, 1 drop at bedtime., Disp: , Rfl:  .  triamcinolone ointment (KENALOG) 0.5 %, Apply 1 application topically 2 (two) times daily., Disp: 30 g, Rfl: 0 .  vitamin B-12 (CYANOCOBALAMIN) 50 MCG tablet, Take 50 mcg by mouth daily., Disp: , Rfl:   Current Facility-Administered Medications:  .  cefTRIAXone (ROCEPHIN) injection 1 g, 1 g, Intramuscular, Once, Minette Brine, FNP   No Known Allergies   Review of Systems  Constitutional: Negative.   Respiratory: Negative.   Cardiovascular: Negative.   Skin: Positive for rash.       Blisters to both feet and redness going up leg with redness Also complaining of itching to her legs  Psychiatric/Behavioral: Negative.      Today's Vitals   01/02/20 1220  BP: 120/72  Pulse: 68  Temp: 98.1 F (36.7 C)  TempSrc: Oral  Weight: 130 lb 4.7 oz (59.1 kg)  Height: 5' 4.8" (1.646 m)  PainSc: 0-No pain   Body mass index is 21.82 kg/m.   Objective:  Physical Exam Constitutional:      General: She is not in acute distress.  Appearance: Normal appearance.  Skin:    General: Skin is warm.     Capillary Refill: Capillary refill takes less than 2 seconds.     Findings: Erythema (bilateral lower extremties has erythema present) and wound (blisters to both lower extremities, some areas are gray in color and she has an open area to her right malleolus area.  ) present.          Comments: She has more blisters on her feet with some graying and yellow color  Neurological:     General: No focal deficit present.     Mental Status: She is alert and oriented to person, place, and time.     Cranial Nerves: No cranial nerve deficit.  Psychiatric:        Mood and Affect: Mood normal.        Behavior: Behavior normal.        Thought Content: Thought content normal.        Judgment: Judgment normal.         Assessment And Plan:     1. Cellulitis of  lower extremity, unspecified laterality  Continues to have erythema to bilateral legs appears to moving up leg.  Will treat her with rocephin 1 gram and she is to continue cephalexin - Ambulatory referral to [facility] - cefTRIAXone (ROCEPHIN) injection 1 g  2. Blisters of multiple sites  Multiple blisters to bilateral ankles and feet few are open  Will refer to home health for wound care.   She is to follow up with me next week - Ambulatory referral to [place], [place] DISTANCING DUE TO THE COVID-19 PANDEMIC.

## 2020-01-03 ENCOUNTER — Encounter: Payer: Self-pay | Admitting: Nurse Practitioner

## 2020-01-03 ENCOUNTER — Other Ambulatory Visit: Payer: Self-pay | Admitting: Nurse Practitioner

## 2020-01-03 DIAGNOSIS — R601 Generalized edema: Secondary | ICD-10-CM

## 2020-01-03 DIAGNOSIS — L03119 Cellulitis of unspecified part of limb: Secondary | ICD-10-CM

## 2020-01-07 ENCOUNTER — Ambulatory Visit (INDEPENDENT_AMBULATORY_CARE_PROVIDER_SITE_OTHER): Payer: 59 | Admitting: Nurse Practitioner

## 2020-01-07 ENCOUNTER — Other Ambulatory Visit: Payer: Self-pay

## 2020-01-07 VITALS — BP 120/60 | HR 74 | Temp 98.9°F | Ht 64.8 in | Wt 126.2 lb

## 2020-01-07 DIAGNOSIS — R600 Localized edema: Secondary | ICD-10-CM | POA: Diagnosis not present

## 2020-01-07 DIAGNOSIS — L299 Pruritus, unspecified: Secondary | ICD-10-CM | POA: Diagnosis not present

## 2020-01-07 DIAGNOSIS — R21 Rash and other nonspecific skin eruption: Secondary | ICD-10-CM

## 2020-01-07 NOTE — Progress Notes (Signed)
This visit occurred during the SARS-CoV-2 public health emergency.  Safety protocols were in place, including screening questions prior to the visit, additional usage of staff PPE, and extensive cleaning of exam room while observing appropriate contact time as indicated for disinfecting solutions.  Subjective:     Patient ID: Denise [name] , female    DOB: 08-04-1950 , 69 y.o.   MRN: [id number]   Chief Complaint  Patient presents with  . Recurrent Skin Infections    HPI  She is here today to follow up with her rash to her bilateral lower extremities.  Over the weekend after having her Rocephin injection she had a worsening rash to the groin and her back. She was advised to take Benadryl and was changed from cephalexin to doxycycline. So far she is doing better. She continues to intermittently taking benadryl for the itching. She is here today with her daughter    Past Medical History:  Diagnosis Date  . Chronic kidney disease    as a child, no present issues   . Glaucoma   . Osteopenia 06/2018   T score -1.9 FRAX 3% / 0.2%  . 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 Hypertension Mother   . Heart disease Mother   . Diabetes Father   . Hypertension Father   . Other Father        had a condition that was "a cousin" to ALS   . Diabetes Sister   . Tuberculosis Maternal Grandmother   . Heart disease Sister   . Colitis Daughter   . Colon cancer Neg Hx   . Colon polyps Neg Hx   . Esophageal cancer Neg Hx   . Rectal cancer Neg Hx   . Stomach cancer Neg Hx      Current Outpatient Medications:  .  Cholecalciferol (VITAMIN D3) 10 MCG (400 UNIT) CAPS, Take 1 capsule by mouth daily. Pt unaware of dose, Disp: , Rfl:  .  diphenhydrAMINE HCl (BENADRYL ALLERGY PO), Take by mouth., Disp: , Rfl:  .  doxycycline (VIBRAMYCIN) 50 MG capsule, Take 50 mg by mouth 2 (two) times daily., Disp: , Rfl:  .  hydrochlorothiazide (HYDRODIURIL) 12.5 MG tablet,  TAKE 1 TABLET BY MOUTH EVERY DAY, Disp: 90 tablet, Rfl: 1 .  timolol (BETIMOL) 0.25 % ophthalmic solution, 1-2 drops daily. , Disp: , Rfl:  .  Travoprost, BAK Free, (TRAVATAN) 0.004 % SOLN ophthalmic solution, 1 drop at bedtime., Disp: , Rfl:  .  vitamin B-12 (CYANOCOBALAMIN) 50 MCG tablet, Take 50 mcg by mouth daily., Disp: , Rfl:  .  triamcinolone ointment (KENALOG) 0.5 %, APPLY TO AFFECTED AREA TWICE A DAY (Patient not taking: Reported on 01/07/2020), Disp: 30 g, Rfl: 0   Allergies  Allergen Reactions  . Nystatin-Triamcinolone Swelling    BLISTERS  . Cephalexin Itching and Rash     Review of Systems  Constitutional: Negative.   Respiratory: Negative.  Negative for cough and shortness of breath.   Cardiovascular: Positive for leg swelling (bilateral leg swelling). Negative for chest pain and palpitations.  Skin: Positive for rash (bilateral lower extremities).       Erythema to bilateral lower extremities     Today's Vitals   01/07/20 1130  BP: 120/60  Pulse: 74  Temp: 98.9 F (37.2 C)  TempSrc: Oral  Weight: 126 lb 3.2 oz (57.2 kg)  Height: 5' 4.8" (1.646 m)  PainSc: 0-No pain  Body mass index is 21.13 kg/m.   Objective:  Physical Exam Constitutional:      General: She is not in acute distress.    Appearance: Normal appearance.  Pulmonary:     Effort: Pulmonary effort is normal.  Skin:    General: Skin is warm.     Capillary Refill: Capillary refill takes less than 2 seconds.     Findings: Erythema (bilateral lower extremties has erythema present with stocking glove appearance. she has improved blisters but still has skin peeling with some scabbed areas.) and wound (blisters to both lower extremities, some areas are gray in color and she has an open area to her right malleolus area.  ) present.          Comments: Darkened scabbing area  Neurological:     General: No focal deficit present.     Mental Status: She is alert and oriented to person, place, and time.    Psychiatric:        Mood and Affect: Mood normal.        Behavior: Behavior normal.        Thought Content: Thought content normal.        Judgment: Judgment normal.         Assessment And Plan:     1. Rash and nonspecific skin eruption  Erythematous rash to bilateral lower extremities with stocking glove appearance  Improved since last visit but still has some gray area with scabbing and peeling skin  She is to use dial soap to cleanse lightly and apply bacitracin to open areas.  - Ambulatory referral to Dermatology  2. Pruritus  Continues to have bullous rash present to feet - Ambulatory referral to Dermatology  3. Lower extremity edema  This is improving however concerns about vascular issues.   Encouraged to continue to elevate feet when possible.  - Ambulatory referral to Vascular Surgery   Minette Brine, FNP    THE PATIENT IS ENCOURAGED TO PRACTICE SOCIAL DISTANCING DUE TO THE COVID-19 PANDEMIC.

## 2020-01-07 NOTE — Patient Instructions (Signed)
Vitamin B12 Deficiency Vitamin B12 deficiency occurs when the body does not have enough vitamin B12, which is an important vitamin. The body needs this vitamin:  To make red blood cells.  To make DNA. This is the genetic material inside cells.  To help the nerves work properly so they can carry messages from the brain to the body. Vitamin B12 deficiency can cause various health problems, such as a low red blood cell count (anemia) or nerve damage. What are the causes? This condition may be caused by:  Not eating enough foods that contain vitamin B12.  Not having enough stomach acid and digestive fluids to properly absorb vitamin B12 from the food that you eat.  Certain digestive system diseases that make it hard to absorb vitamin B12. These diseases include Crohn's disease, chronic pancreatitis, and cystic fibrosis.  A condition in which the body does not make enough of a protein (intrinsic factor), resulting in too few red blood cells (pernicious anemia).  Having a surgery in which part of the stomach or small intestine is removed.  Taking certain medicines that make it hard for the body to absorb vitamin B12. These medicines include: ? Heartburn medicines (antacids and proton pump inhibitors). ? Certain antibiotic medicines. ? Some medicines that are used to treat diabetes, tuberculosis, gout, or high cholesterol. What increases the risk? The following factors may make you more likely to develop a B12 deficiency:  Being older than age 69.  Eating a vegetarian or vegan diet, especially while you are pregnant.  Eating a poor diet while you are pregnant.  Taking certain medicines.  Having alcoholism. What are the signs or symptoms? In some cases, there are no symptoms of this condition. If the condition leads to anemia or nerve damage, various symptoms can occur, such as:  Weakness.  Fatigue.  Loss of appetite.  Weight loss.  Numbness or tingling in your hands and  feet.  Redness and burning of the tongue.  Confusion or memory problems.  Depression.  Sensory problems, such as color blindness, ringing in the ears, or loss of taste.  Diarrhea or constipation.  Trouble walking. If anemia is severe, symptoms can include:  Shortness of breath.  Dizziness.  Rapid heart rate (tachycardia). How is this diagnosed? This condition may be diagnosed with a blood test to measure the level of vitamin B12 in your blood. You may also have other tests, including:  A group of tests that measure certain characteristics of blood cells (complete blood count, CBC).  A blood test to measure intrinsic factor.  A procedure where a thin tube with a camera on the end is used to look into your stomach or intestines (endoscopy). Other tests may be needed to discover the cause of B12 deficiency. How is this treated? Treatment for this condition depends on the cause. This condition may be treated by:  Changing your eating and drinking habits, such as: ? Eating more foods that contain vitamin B12. ? Drinking less alcohol or no alcohol.  Getting vitamin B12 injections.  Taking vitamin B12 supplements. Your health care provider will tell you which dosage is best for you. Follow these instructions at home: Eating and drinking   Eat lots of healthy foods that contain vitamin B12, including: ? Meats and poultry. This includes beef, pork, chicken, [place], and organ meats, such as liver. ? Seafood. This includes clams, rainbow trout, salmon, tuna, and haddock. ? Eggs. ? Cereal and dairy products that are fortified. This means that vitamin B12  has been added to the food. Check the label on the package to see if the food is fortified. The items listed above may not be a complete list of recommended foods and beverages. Contact a dietitian for more information. General instructions  Get any injections that are prescribed by your health care provider.  Take  supplements only as told by your health care provider. Follow the directions carefully.  Do not drink alcohol if your health care provider tells you not to. In some cases, you may only be asked to limit alcohol use.  Keep all follow-up visits as told by your health care provider. This is important. Contact a health care provider if:  Your symptoms come back. Get help right away if you:  Develop shortness of breath.  Have a rapid heart rate.  Have chest pain.  Become dizzy or lose consciousness. Summary  Vitamin B12 deficiency occurs when the body does not have enough vitamin B12.  The main causes of vitamin B12 deficiency include dietary deficiency, digestive diseases, pernicious anemia, and having a surgery in which part of the stomach or small intestine is removed.  In some cases, there are no symptoms of this condition. If the condition leads to anemia or nerve damage, various symptoms can occur, such as weakness, shortness of breath, and numbness.  Treatment may include getting vitamin B12 injections or taking vitamin B12 supplements. Eat lots of healthy foods that contain vitamin B12. This information is not intended to replace advice given to you by your health care provider. Make sure you discuss any questions you have with your health care provider. Document Revised: 12/07/2018 Document Reviewed: 02/27/2018 Elsevier Patient Education  [name].   Take the liquid or sublingual type of vitamin B12 supplement.

## 2020-01-08 ENCOUNTER — Encounter: Payer: Self-pay | Admitting: Nurse Practitioner

## 2020-01-19 ENCOUNTER — Encounter: Payer: Self-pay | Admitting: Nurse Practitioner

## 2020-01-23 ENCOUNTER — Encounter: Payer: Self-pay | Admitting: Nurse Practitioner

## 2020-01-28 ENCOUNTER — Ambulatory Visit: Payer: 59 | Admitting: Nurse Practitioner

## 2020-01-30 ENCOUNTER — Ambulatory Visit (HOSPITAL_COMMUNITY)
Admission: RE | Admit: 2020-01-30 | Discharge: 2020-01-30 | Disposition: A | Discharge status: Home / Self Care | Payer: 59 | Source: Ambulatory Visit | Attending: Neurology | Admitting: Neurology

## 2020-01-30 ENCOUNTER — Other Ambulatory Visit: Payer: Self-pay

## 2020-01-30 DIAGNOSIS — G20 Parkinson's disease: Secondary | ICD-10-CM | POA: Insufficient documentation

## 2020-01-30 DIAGNOSIS — R269 Unspecified abnormalities of gait and mobility: Secondary | ICD-10-CM

## 2020-01-30 DIAGNOSIS — R2689 Other abnormalities of gait and mobility: Secondary | ICD-10-CM

## 2020-01-30 DIAGNOSIS — G20C Parkinsonism, unspecified: Secondary | ICD-10-CM

## 2020-01-30 MED ORDER — IODINE STRONG (LUGOLS) 5 % PO SOLN: 0.8000 mL | Freq: Once | ORAL | Status: AC

## 2020-01-30 MED ORDER — IOFLUPANE I 123 185 MBQ/2.5ML IV SOLN
4.1800 | Freq: Once | INTRAVENOUS | Status: AC | PRN
  Administered 2020-01-30: 4.18 via INTRAVENOUS
  Filled 2020-01-30: qty 5

## 2020-01-30 MED ORDER — IODINE STRONG (LUGOLS) 5 % PO SOLN
ORAL | Status: AC
  Administered 2020-01-30: 0.8 mL via ORAL
  Filled 2020-01-30: qty 1

## 2020-02-03 ENCOUNTER — Telehealth: Payer: Self-pay | Admitting: Neurology

## 2020-02-03 NOTE — Telephone Encounter (Signed)
I called Denise [name] back at [facility] and spoke with Truman Hayward. They have sent the results to Dr Jaynee Eagles and wanted to make sure they were received. They have been received electronically. D/t provider being out of office, I am sending result to Dr. Brett Fairy to review and complete result note for the patient.

## 2020-02-03 NOTE — Telephone Encounter (Signed)
Pt calling to check on results. Would like a call from the nurse.

## 2020-02-03 NOTE — Progress Notes (Signed)
Parkinson's disease is likely present given the absence of tracer in the putamina, posterior striata,

## 2020-02-03 NOTE — Telephone Encounter (Signed)
[email or web address]  is asking for a call from [facility] to give results. Please call [phone] and anyone can provide results

## 2020-02-03 NOTE — Telephone Encounter (Signed)
I called the pt and let her know that the results will be reviewed by a [facility] MD first and then we will call her with the results. May take a couple days or so. She verbalized appreciation.

## 2020-02-05 ENCOUNTER — Telehealth: Payer: Self-pay | Admitting: Neurology

## 2020-02-05 NOTE — Telephone Encounter (Signed)
Pt calling for results. Would like a call from the nurse.

## 2020-02-05 NOTE — Telephone Encounter (Signed)
I called the pt and spoke with her and her daughter (?). I discussed the results of the DAT scan but also advised the patient that this is not in and of itself a diagnosis of Parkinson's disease and that the results of the test along with other clinical findings will need to be discussed with Dr. Jaynee Eagles at the office as Parkinson's disease requires a clinical diagnosis. The current f/u appt is scheduled for 04/09/20 but they would really like to come in sooner. At this time we did not have sooner openings so I advised they would be placed on a wait list and I would discuss w/ Dr. Jaynee Eagles when she returns to the office. Their questions were answered during the call and they verbalized appreciation.   Result note from Dr. Brett Fairy: Parkinson's disease is likely present given the absence of tracer in the putamina, posterior striata,

## 2020-02-10 NOTE — Telephone Encounter (Signed)
Wednesday 930 so I have a little extra time thanks

## 2020-02-10 NOTE — Telephone Encounter (Signed)
I called the pt and offered appt this Wed 8/11 at 9:30 AM arrival 9:15 AM. Pt will check with her daughter and let me know asap.

## 2020-02-10 NOTE — Telephone Encounter (Signed)
I spoke with the pt. Her daughter is unable to come on Wednesdays and 1/2 day Thursday. I spoke with Dr. Jaynee Eagles. Pt was scheduled for next Monday 02/17/20 @ 3:30 pm arrival 3:15. She will call if this conflicts with her daughter's schedule. She verbalized appreciation.

## 2020-02-16 NOTE — Progress Notes (Signed)
[facility]    Provider:  Dr Jaynee Eagles Requesting Provider: Minette Brine, FNP Primary Care Provider:  Minette Brine, FNP  CC:  Unsteady gait  Interval history February 17, 2020: Patient was seen alone last time for gait abnormality and parkinsonism, she is here today for follow-up and discussion and also brings her daughter.  Daughter provides much information today as well.  We discussed findings which included MRI of the brain which showed some mild chronic microvascular changes and mild atrophy.  However DaTscan was consistent with a parkinsonian syndrome.  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  We had an extended visit today, reviewed case with daughter, discussed findings on MRI of the brain and findings on DaTscan.  We discussed pathophysiology of Parkinson's disease, natural progression, treatment, answered all questions.  We discussed different treatment options, deep brain stimulation, clinical trials, skin checks for cancer which are increased in Parkinson's disease use, risk for falls, we reviewed thoughts of information with local resources including Parkinson's classes, Parkinson's support groups, online information.  We also discussed side effects of medications, dyskinesias, signs and symptoms of Parkinson's disease, she is not experiencing any swallowing difficulties at this time, we discussed safety and falls.  We had a very long discussion, we started her on Sinemet, answered all questions, we will see her back in about 6 weeks to see how she is doing.   HPI:  Denise [name] is a 69 y.o. female here as requested by Minette Brine, [place] for trouble ambulating. PMHx CKD, obesity, TB, osteopenia, glaucoma with optic atrophy, B12 deficiency, fatigue, pre-diabetes.  I reviewed Denise [name]'s notes, daughter and nursing has her mom has been "tensing up when walking", stiffness, she has been going to a chiropractor and working with  them for 6 months, sometimes she feels like a backpack on her back but no did not anything, someday she does better with water, she is writing smaller, taking longer to get dressed, gait slower, examination was largely unremarkable including cardiovascular, pulmonary, however a neurologic exam Romberg sign was slightly positive, slow to respond with some of her questions, mild cogwheeling to her right wrist.  She is here alone. She reports her best friend noticed her walking was "off" a little, she is doing better since seeing a chiropractor, she reports 3 years ago she was under a lot of stress and she kept to herself and that's when she started noticing symptoms, slowly progressive,, her daughter also noticed. She is a poor historian, she walks slumped over, she is slower than she used to be, she gets in the habit of not picking feet up enough, no tremors, no falls, she does not use a cane, her handwriting is getting smaller, she has always had a soft voise, no vivid dreams,no dizziness or postural symptoms, smell impaired for a long time, about 20 years lost sense of smell, no problem with taste, she wears adult briefs for incontinence, no difficulty swallowing, she has low B12 and she is going to take injections, no constipation, no vision changes. No history of dopamine blocking drugs. No neck pain. No other focal neurologic deficits, associated symptoms, inciting events or modifiable factors.  Reviewed notes, labs and imaging from outside physicians, which showed:  IMPRESSION: Personally reviewed images from 2014 x-ray lumbar spine and agree with the following  1.  Lateral projection is off axis significantly reducing sensitivity and specificity.  Suspected anterolisthesis at L5-S1. I cannot exclude pars defects at L5.  Lower  lumbar facet arthropathy. 2. Prominence of stool throughout the colon suggests constipation. 3.  Expanded left pubic body is suggested on the frontal projection, possibly  post-traumatic or from spurring.  Sed rate 2, CMP unremarkable slightly decreased GFR, vitamin B12 was 299 (in the normal range but may still be B12 deficiency), TSH normal, autoimmune panel double-stranded DNA/complement C3/ANA all negative or within normal range. Review of Systems: Patient complains of symptoms per HPI as well as the following symptoms: walking is "off". Pertinent negatives and positives per HPI. All others negative.   Social History   Socioeconomic History  . Marital status: Single    Spouse name: Not on file  . Number of children: 3  . Years of education: Not on file  . Highest education level: Master's degree (e.g., MA, MS, MEng, MEd, MSW, MBA)  Occupational History  . Not on file  Tobacco Use  . Smoking status: Never Smoker  . Smokeless tobacco: Never Used  Vaping Use  . Vaping Use: Never used  Substance and Sexual Activity  . Alcohol use: Never  . Drug use: No  . Sexual activity: Yes    Birth control/protection: Post-menopausal  Other Topics Concern  . Not on file  Social History Narrative   Lives alone   Right handed   Caffeine: none   Social Determinants of Health   Financial Resource Strain:   . Difficulty of Paying Living Expenses:   Food Insecurity:   . Worried About Charity fundraiser in the Last Year:   . Arboriculturist in the Last Year:   Transportation Needs:   . Film/video editor (Medical):   Marland Kitchen Lack of Transportation (Non-Medical):   Physical Activity:   . Days of Exercise per Week:   . Minutes of Exercise per Session:   Stress:   . Feeling of Stress :   Social Connections:   . Frequency of Communication with Friends and Family:   . Frequency of Social Gatherings with Friends and Family:   . Attends Religious Services:   . Active Member of Clubs or Organizations:   . Attends Archivist Meetings:   Marland Kitchen Marital Status:   Intimate Partner Violence:   . Fear of Current or Ex-Partner:   . Emotionally Abused:   Marland Kitchen  Physically Abused:   . Sexually Abused:     Family History  Problem Relation Age of Onset  . Hypertension Mother   . Heart disease Mother   . Diabetes Father   . Hypertension Father   . Other Father        had a condition that was "a cousin" to ALS   . Diabetes Sister   . Tuberculosis Maternal Grandmother   . Heart disease Sister   . Colitis Daughter   . Colon cancer Neg Hx   . Colon polyps Neg Hx   . Esophageal cancer Neg Hx   . Rectal cancer Neg Hx   . Stomach cancer Neg Hx     Past Medical History:  Diagnosis Date  . Chronic kidney disease    as a child, no present issues   . Glaucoma   . Osteopenia 06/2018   T score -1.9 FRAX 3% / 0.2%  . 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 Parkinson's disease ([place]) 02/17/2020  . Gait abnormality 12/04/2019  . Prediabetes 12/03/2018  . Vitamin D deficiency 12/03/2018  . Anemia due to  vitamin B12 deficiency 12/03/2018  . Fatigue 12/03/2018  . Urinary frequency 10/01/2018  . Vitamin B12 deficiency 10/01/2018  . 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 Osteopenia 08/23/2012    Past Surgical History:  Procedure Laterality Date  . TUBAL LIGATION      Current Outpatient Medications  Medication Sig Dispense Refill  . Cholecalciferol (VITAMIN D3) 10 MCG (400 UNIT) CAPS Take 1 capsule by mouth daily. Pt unaware of dose    . hydrochlorothiazide (HYDRODIURIL) 12.5 MG tablet TAKE 1 TABLET BY MOUTH EVERY DAY 90 tablet 1  . timolol (BETIMOL) 0.25 % ophthalmic solution 1-2 drops daily.     . Travoprost, BAK Free, (TRAVATAN) 0.004 % SOLN ophthalmic solution 1 drop at bedtime.    . triamcinolone ointment (KENALOG) 0.5 % APPLY TO AFFECTED AREA TWICE A DAY 30 g 0  . carbidopa-levodopa (SINEMET IR) 25-100 MG tablet Take 1 tablet by mouth 3 (three) times daily. Take 4 hours apart such as 8am,  noon and 4pm. Separate from protein. Can each with crackers or bread or juice. 60 tablet 2   No current facility-administered medications for this visit.    Allergies as of 02/17/2020 - Review Complete 02/17/2020  Allergen Reaction Noted  . Nystatin-triamcinolone Swelling 01/02/2020  . Cephalexin Itching and Rash 01/07/2020    Vitals: BP 132/74 (BP Location: Right Arm, Patient Position: Sitting)   Pulse 60   Ht 5\' 6"  (1.676 m)   Wt 128 lb (58.1 kg)   BMI 20.66 kg/m  Last Weight:  Wt Readings from Last 1 Encounters:  02/17/20 128 lb (58.1 kg)   Last Height:   Ht Readings from Last 1 Encounters:  02/17/20 5\' 6"  (1.676 m)     Physical exam: Exam: Gen: NAD, conversant, thin and appears slightly rail            CV: RRR, no MRG. No Carotid Bruits. + peripheral edema distally, warm, nontender Eyes: Conjunctivae clear without exudates or hemorrhage  Neuro: Detailed Neurologic Exam  Speech:    Speech is normal; fluent and spontaneous with normal comprehension.  Cognition:    The patient is oriented to person, month, year, name of location     recent and remote memory impaired    language fluent;     normal attention, concentration,     fund of knowledge: (knows president, VP), overall may be decreased slightly Cranial Nerves:    The pupils are equal, round, and reactive to light.Attempted fundoscopy could not visulize due to small pupils.  Visual fields are full to finger confrontation. Extraocular movements are intact. Trigeminal sensation is intact and the muscles of mastication are normal. The face is symmetric. Hypomimia. The palate elevates in the midline. Hearing intact to voice. Voice is nypophonic. Shoulder shrug is normal. The tongue has normal motion without fasciculations.   Coordination:    Normal finger to nose without dysmetria or ataxia but bradykinetic,  rapid alternating movements not significantly impaired  Gait:    Stooped, reduced arm swing, slightly  shuffling low clearance and narrow gait.   Motor Observation:    Postural tremor, high frequency low amplitude Tone:    Possibly some very minimal cogwheeling right arm with left hand facilitation  Posture:    Posture is normal. normal erect    Strength:    Strength is V/V in the upper and lower limbs.      Sensation: intact to LT  Reflex Exam:  DTR's:    Deep tendon reflexes in the upper and lower extremities are slightly brisk bilaterally.   Toes:    The toes are equivocal bilaterally.   Clonus:    Clonus is absent.    Assessment/Plan:  69 y.o. female here as requested by Minette Brine, FNP for trouble ambulating. PMHx CKD, obesity, TB, osteopenia, glaucoma with optic atrophy, B12 deficiency, fatigue, pre-diabetes.  Patient with hypophonia, hypokalemia, micrographia, shuffling gait, decreased arm swing, stooped posture, bradykinetic, suspect disorder in the Parkinson's disease spectrum(idiopathic PD, lewy body etc)  or possibly NPH or vascular dementia causing gait apraxia.  I discussed with patient, DaTScan c/w Parkinsonian disorder and correlates with clinical findings.   MRI of the brain w/wo contrast: very mild chronic microvascular ischemic change and mild generalized cortical atrophy most noted in the medial temporal lobes.  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  PT: [place] for gait abnormality and Parkinson's disease [place]: Young patient with likely idiopathic Parkinson's Disease, will refer to [place] to establish care, evaluation and treatment, to discuss future DBS,genetic testing and she is also interested in any clinical trials available) but I will continue to follow her as well  Discussed: Start  Carbidopa/Levodopa Regular skin checks Exercise can slow progression of PD, gave information on local resources, PD groups and classes in the area Discussed safety with falls in the home and outside the home, drive during the day only to close places you know well, may consider using a walking aid, at night keep lights on maybe even a commode by the bed Hydrate to avoid dehydration urinary tract infections  Discussed fall risks and precautions  Will see her back in 6 weeks   Orders Placed This Encounter  Procedures  . Ambulatory referral to Physical Therapy  . Ambulatory referral to Neurology   Meds ordered this encounter  Medications  . DISCONTD: carbidopa-levodopa (SINEMET IR) 25-100 MG tablet    Sig: Take 1 tablet by mouth 3 (three) times daily. Take 4 hours apart such as 8am, noon and 4pm. Separate from protein. Can each with crackers or bread or juice.    Dispense:  60 tablet    Refill:  2  . carbidopa-levodopa (SINEMET IR) 25-100 MG tablet    Sig: Take 1 tablet by mouth 3 (three) times daily. Take 4 hours apart such as 8am, noon and 4pm. Separate from protein. Can each with crackers or bread or juice.    Dispense:  60 tablet    Refill:  2    Cc: Minette Brine, FNP,  Minette Brine, FNP  [facility], [facility] [address] [place], [place] [address]  Phone [phone] Fax [phone]  I spent more than 60 minutes of face-to-face and non-face-to-face time with patient on the  1. Parkinson's disease ([place])   2. Gait abnormality    diagnosis.  This included previsit chart review, lab review, study review, order entry, electronic health record documentation, patient education on the different diagnostic and therapeutic options, counseling and coordination of care, risks and benefits of management, compliance, or risk factor reduction

## 2020-02-17 ENCOUNTER — Encounter: Payer: Self-pay | Admitting: Neurology

## 2020-02-17 ENCOUNTER — Ambulatory Visit: Payer: 59 | Admitting: Neurology

## 2020-02-17 VITALS — BP 132/74 | HR 60 | Ht 66.0 in | Wt 128.0 lb

## 2020-02-17 DIAGNOSIS — G20 Parkinson's disease: Secondary | ICD-10-CM

## 2020-02-17 DIAGNOSIS — R269 Unspecified abnormalities of gait and mobility: Secondary | ICD-10-CM

## 2020-02-17 DIAGNOSIS — G20A1 Parkinson's disease without dyskinesia, without mention of fluctuations: Secondary | ICD-10-CM | POA: Insufficient documentation

## 2020-02-17 MED ORDER — CARBIDOPA-LEVODOPA 25-100 MG PO TABS: 1.0000 | ORAL_TABLET | Freq: Three times a day (TID) | ORAL | 2 refills | Status: DC

## 2020-02-17 MED ORDER — CARBIDOPA-LEVODOPA 25-100 MG PO TABS
1.0000 | ORAL_TABLET | Freq: Three times a day (TID) | ORAL | 2 refills | Status: DC
Start: 2020-02-17 — End: 2020-02-17

## 2020-02-17 NOTE — Patient Instructions (Addendum)
Physical Therapy: [facility] Start Carbidopa/Levodopa Regular skin checks Hydrate Exercise in PT as well as in the documents provided Safety with falls in the home and outside the home, drive during the day only to close places you know well, may consider using a walking aid, at night keep lights on maybe even a commode by the bed Fall safety Hydrate to avoid dehydration urinary tract infections Referral to [place] team (genetic testing, clinical trials, Deep Brain Stimulation in the future)  Parkinson's Disease Parkinson's disease is a type of movement disorder. It is a long-term condition that gets worse over time (is progressive). Each person with Parkinson's disease is affected differently. This condition limits your ability to control movements and move your body normally. The condition can range from mild to severe. Parkinson's disease tends to get worse slowly over several years. What are the causes? Parkinson's disease results from a loss of brain cells (neurons) that make a brain chemical called dopamine. Dopamine is needed to control movement. As the condition gets worse, neurons make less dopamine. This makes it hard to move or control your movements. The exact cause of the loss of neurons and why they make less dopamine is not known. Factors related to genes and the environment may contribute to the cause of Parkinson's disease. What increases the risk? The following factors may make you more likely to develop this condition:  Being female.  Being age 65 or older.  Having a family history of Parkinson's disease.  Having had a traumatic brain injury.  Having experienced depression.  Having been exposed to toxins, such as pesticides. What are the signs or symptoms? Symptoms of this condition can vary. The main symptoms are related to movement. These include:  A tremor or shaking while you are resting that you cannot control.  Stiffness in your neck, arms,  and legs (rigidity).  Slowing of movement. You may lose facial expressions and have trouble making small movements that are needed to button clothing or brush your teeth.  An abnormal walk. You may walk with short, shuffling steps.  Loss of balance and stability when standing. You may sway, fall backward, and have trouble making turns. Other symptoms include:  Mental or cognitive changes including depression, anxiety, having false beliefs (delusions), or seeing, hearing, or feeling things that do not exist (hallucinations).  Trouble speaking or swallowing.  Changes in bowel or bladder functions including constipation, having to go urgently or frequently, or not being able to control your bowel or bladder.  Changes in sleep habits or trouble sleeping. Parkinson's disease may be graded by severity of your condition as mild, moderate, or advanced. Parkinson's disease progression is different for everyone. You may not progress to the advanced stage.  Mild Parkinson's disease involves: ? Movement problems that do not affect daily activities. ? Movement problems on one side of the body.  Moderate Parkinson's disease involves: ? Movement problems on both sides of the body. ? Slowing of movement. ? Coordination and balance problems.  Advanced Parkinson's disease involves: ? Extreme difficulty walking. ? Inability to live alone safely. ? Signs of dementia, such as having trouble remembering things, doing daily tasks such as getting dressed, and problem solving. How is this diagnosed? This condition is diagnosed by a specialist. A diagnosis may be made based on symptoms, your medical history, and a physical exam. You may also have brain imaging tests to check for a loss of dopamine-producing areas of the brain. How is this treated? There is no  cure for Parkinson's disease. Treatment focuses on managing your symptoms. Treatment may include:  Medicines. Everyone responds to medicines  differently. Your response may change over time. Work with your health care provider to find the best medicines for you.  Speech, occupational, and physical therapy.  Deep brain stimulation surgery to reduce tremors and other involuntary movements. Follow these instructions at home: Medicines  Take over-the-counter and prescription medicines only as told by your health care provider.  Avoid taking medicines that can affect thinking, such as pain or sleeping medicines. Eating and drinking  Follow instructions from your health care provider about eating or drinking restrictions.  Do not drink alcohol. Activity  Talk with your health care provider about if it is safe for you to drive.  Do exercises as told by your health care provider or physical therapist. Lifestyle      Install grab bars and railings in your home to prevent falls.  Do not use any products that contain nicotine or tobacco, such as cigarettes, e-cigarettes, and chewing tobacco. If you need help quitting, ask your health care provider.  Consider joining a support group for people with Parkinson's disease. General instructions  Work with your health care provider to determine what you need help with and what your safety needs are.  Keep all follow-up visits as told by your health care provider, including any visits with a physical therapist, speech therapist, or occupational therapist. This is important. Contact a health care provider if:  Medicines do not help your symptoms.  You are unsteady or have fallen at home.  You need more support to function well at home.  You have trouble swallowing.  You have severe constipation.  You are having problems with side effects from your medicines.  You feel confused, anxious, or depressed. Get help right away if you:  Are injured after a fall.  See or hear things that are not real.  Cannot swallow without choking.  Have chest pain or trouble  breathing.  Do not feel safe at home.  Have thoughts about hurting yourself or others. If you ever feel like you may hurt yourself or others, or have thoughts about taking your own life, get help right away. You can go to your nearest emergency department or call:  Your local emergency services (911 in the [place]  A suicide crisis helpline, such as the [facility] at (959) 272-6124. This is open 24 hours a day. Summary  Parkinson's disease is a long-term condition that gets worse over time. This condition limits your ability to control your movements and move your body normally.  There is no cure for Parkinson's disease. Treatment focuses on managing your symptoms.  Work with your health care provider to determine what you need help with and what your safety needs are.  Keep all follow-up visits as told by your health care provider, including any visits with a physical therapist, speech therapist, or occupational therapist. This is important. This information is not intended to replace advice given to you by your health care provider. Make sure you discuss any questions you have with your health care provider. Document Revised: 09/06/2018 Document Reviewed: 09/06/2018 Elsevier Patient Education  [place]; Levodopa tablets What is this medicine? CARBIDOPA;LEVODOPA (kar bi DOE pa; lee voe DOE pa) is used to treat the symptoms of Parkinson's disease. This medicine may be used for other purposes; ask your health care provider or pharmacist if you have questions. COMMON BRAND NAME(S):  Atamet, SINEMET What should I tell my health care provider before I take this medicine? They need to know if you have any of these conditions:  depression or other mental illness  diabetes  glaucoma  heart disease, including history of a heart attack  history of irregular heartbeat  kidney disease  liver disease  lung or breathing disease, like  asthma  narcolepsy  sleep apnea  stomach or intestine problems  an unusual or allergic reaction to levodopa, carbidopa, other medicines, foods, dyes, or preservatives  pregnant or trying to get pregnant  breast-feeding How should I use this medicine? Take this medicine by mouth with a glass of water. Follow the directions on the prescription label. Take your doses at regular intervals. Do not take your medicine more often than directed. Do not stop taking except on the advice of your doctor or health care professional. Talk to your pediatrician regarding the use of this medicine in children. Special care may be needed. Overdosage: If you think you have taken too much of this medicine contact a poison control center or emergency room at once. NOTE: This medicine is only for you. Do not share this medicine with others. What if I miss a dose? If you miss a dose, take it as soon as you can. If it is almost time for your next dose, take only that dose. Do not take double or extra doses. What may interact with this medicine? Do not take this medicine with any of the following medications:  MAOIs like Marplan, Nardil, and Parnate  reserpine  tetrabenazine This medicine may also interact with the following medications:  alcohol  droperidol  entacapone  iron supplements or multivitamins with iron  isoniazid, INH  linezolid  medicines for depression, anxiety, or psychotic disturbances  medicines for high blood pressure  medicines for sleep  metoclopramide  papaverine  procarbazine  tedizolid  rasagiline  selegiline  tolcapone This list may not describe all possible interactions. Give your health care provider a list of all the medicines, herbs, non-prescription drugs, or dietary supplements you use. Also tell them if you smoke, drink alcohol, or use illegal drugs. Some items may interact with your medicine. What should I watch for while using this medicine? Visit  your health care professional for regular checks on your progress. Tell your health care professional if your symptoms do not start to get better or if they get worse. Do not stop taking except on your health care professional's advice. You may develop a severe reaction. Your health care professional will tell you how much medicine to take. You may experience a wearing of effect prior to the time for your next dose of this medicine. You may also experience an on-off effect where the medicine apparently stops working for anything from a minute to several hours, then suddenly starts working again. Tell your doctor or health care professional if any of these symptoms happen to you. Your dose may need to be changed. A high protein diet can slow or prevent absorption of this medicine. Avoid high protein foods near the time of taking this medicine to help to prevent these problems. Take this medicine at least 30 minutes before eating or one hour after meals. You may want to eat higher protein foods later in the day or in small amounts. Discuss your diet with your doctor or health care professional or nutritionist. You may get drowsy or dizzy. Do not drive, use machinery, or do anything that needs mental alertness until  you know how this drug affects you. Do not stand or sit up quickly, especially if you are an older patient. This reduces the risk of dizzy or fainting spells. Alcohol may interfere with the effect of this medicine. Avoid alcoholic drinks. When taking this medicine, you may fall asleep without notice. You may be doing activities like driving a car, talking, or eating. You may not feel drowsy before it happens. Contact your health care provider right away if this happens to you. There have been reports of increased sexual urges or other strong urges such as gambling while taking this medicine. If you experience any of these while taking this medicine, you should report this to your health care provider  as soon as possible. If you are diabetic, this medicine may interfere with the accuracy of some tests for sugar or ketones in the urine (does not interfere with blood tests). Check with your doctor or health care professional before changing the dose of your diabetic medicine. This medicine may discolor the urine or sweat, making it look darker or red in color. This is of no cause for concern. However, this may stain clothing or fabrics. This medicine may cause a decrease in vitamin B6. You should make sure that you get enough vitamin B6 while you are taking this medicine. Discuss the foods you eat and the vitamins you take with your health care professional. What side effects may I notice from receiving this medicine? Side effects that you should report to your doctor or health care professional as soon as possible:  allergic reactions like skin rash, itching or hives, swelling of the face, lips, or tongue  changes in emotions or moods  falling asleep during normal activities like driving  fast, irregular heartbeat  feeling faint or lightheaded, falls  fever  hallucinations  new or increased gambling urges, sexual urges, uncontrolled spending, binge or compulsive eating, or other urges  stomach pain  trouble passing urine or change in the amount of urine  uncontrollable movements of the arms, face, head, mouth, neck, or upper body Side effects that usually do not require medical attention (report to your doctor or health care professional if they continue or are bothersome):  dizziness  headache  loss of appetite  nausea  trouble sleeping This list may not describe all possible side effects. Call your doctor for medical advice about side effects. You may report side effects to FDA at 1-800-FDA-1088. Where should I keep my medicine? Keep out of the reach of children. Store at room temperature between 15 and 30 degrees C (59 and 86 degrees F). Protect from light. Throw away  any unused medicine after the expiration date. NOTE: This sheet is a summary. It may not cover all possible information. If you have questions about this medicine, talk to your doctor, pharmacist, or health care provider.  2020 Elsevier/Gold Standard (2019-02-25 19:49:59)

## 2020-02-24 ENCOUNTER — Ambulatory Visit: Payer: 59 | Admitting: Physical Therapy

## 2020-02-28 ENCOUNTER — Other Ambulatory Visit: Payer: Self-pay

## 2020-02-28 ENCOUNTER — Ambulatory Visit: Payer: 59 | Attending: Neurology | Admitting: Physical Therapy

## 2020-02-28 DIAGNOSIS — R2689 Other abnormalities of gait and mobility: Secondary | ICD-10-CM | POA: Insufficient documentation

## 2020-02-28 DIAGNOSIS — R29818 Other symptoms and signs involving the nervous system: Secondary | ICD-10-CM | POA: Diagnosis present

## 2020-02-28 DIAGNOSIS — R2681 Unsteadiness on feet: Secondary | ICD-10-CM | POA: Diagnosis present

## 2020-02-28 DIAGNOSIS — R293 Abnormal posture: Secondary | ICD-10-CM | POA: Insufficient documentation

## 2020-02-28 DIAGNOSIS — M6281 Muscle weakness (generalized): Secondary | ICD-10-CM | POA: Insufficient documentation

## 2020-02-28 NOTE — Therapy (Signed)
[facility] [address] [place], [place], [address] Phone: [phone]   Fax:  [phone]  Physical Therapy Evaluation  Patient Details  Name: Denise [name] MRN: [id number] Date of Birth: 10/12/1950 Referring Provider (PT): Dr. Jaynee Eagles   Encounter Date: 02/28/2020   PT End of Session - 02/28/20 1422    Visit Number 1    Number of Visits 17    Date for PT Re-Evaluation 17/51/02   90 day cert for 8-wk POC   Authorization Type [place]; VL 30    PT Start Time 0803    PT Stop Time 0845    PT Time Calculation (min) 42 min    Activity Tolerance Patient tolerated treatment well    Behavior During Therapy [facility] for tasks assessed/performed           Past Medical History:  Diagnosis Date  . Chronic kidney disease    as a child, no present issues   . Glaucoma   . Osteopenia 06/2018   T score -1.9 FRAX 3% / 0.2%  . 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 TUBAL LIGATION      There were no vitals filed for this visit.    Subjective Assessment - 02/28/20 0805    Subjective I shuffle my feet sometimes, freeze up sometimes.  Try to get up early to loosen up some.  With the Sinemet, I am not having as much of the freezing episodes.  This has been going on at least 6 months. Feel my left side is stiffer and harder to move.  No falls.  Does not use assistive device    Pertinent History glaucoma, osteopenia, TB (as a child)    Patient Stated Goals Pt's goals for therapy are to get stronger in legs and hands.    Currently in Pain? --   Sometimes pain with waking up or when needing more medication (calves)             [facility] PT Assessment - 02/28/20 0811      Assessment   Medical Diagnosis Parkinson's disease    Referring Provider (PT) Dr. Jaynee Eagles    Onset Date/Surgical Date 02/17/20    Hand Dominance Right      Precautions   Precautions Fall      Balance Screen    Has the patient fallen in the past 6 months No    Has the patient had a decrease in activity level because of a fear of falling?  Yes    Is the patient reluctant to leave their home because of a fear of falling?  No      Home Environment   Living Environment Private residence    Living Arrangements Alone    Available Help at Discharge Family;Friend(s)    Type of [place] to enter    Entrance Stairs-Number of Steps 2    Entrance Stairs-Rails None    Home Layout One level    Home Equipment None    Additional Comments Sometimes get "stuck" on the steps coming into/out of home      Prior Function   Level of Independence Independent    Vocation Full time employment    Systems analyst; has to stand for sermons    Leisure Enjoys walking (long driveway), bowling      Observation/Other Assessments   Focus on Therapeutic Outcomes (FOTO)  NA      Posture/Postural Control   Posture/Postural Control Postural limitations    Postural Limitations Forward head;Rounded Shoulders      Tone   Assessment Location Right Lower Extremity;Left Lower Extremity      ROM / Strength   AROM / PROM / Strength AROM;Strength      AROM   Overall AROM  Deficits    Overall AROM Comments Not full L knee extension (sitting), decreased ankle dorsiflexion LLE      Strength   Overall Strength Deficits    Strength Assessment Site Hip;Knee;Ankle    Right/Left Hip Right;Left    Right Hip Flexion 4+/5    Left Hip Flexion 4+/5    Right/Left Knee Right;Left    Right Knee Flexion 4+/5    Right Knee Extension 4+/5    Left Knee Flexion 4+/5    Left Knee Extension 4+/5    Right/Left Ankle Right;Left    Right Ankle Dorsiflexion 4/5    Left Ankle Dorsiflexion 4/5      Transfers   Transfers Sit to Stand;Stand to Sit    Sit to Stand 6: Modified independent (Device/Increase time);Without upper extremity assist;From chair/3-in-1    Five time sit to stand comments  15.85    Stand  to Sit 6: Modified independent (Device/Increase time);Without upper extremity assist;To chair/3-in-1    Comments Upon standing, pt's hips/knees remain flexed      Ambulation/Gait   Ambulation/Gait Yes    Ambulation/Gait Assistance 6: Modified independent (Device/Increase time)    Ambulation Distance (Feet) 200 Feet    Assistive device None    Gait Pattern Step-through pattern;Decreased arm swing - left;Decreased step length - right;Decreased step length - left;Right foot flat;Left foot flat;Shuffle;Festinating;Decreased trunk rotation;Trunk flexed;Narrow base of support;Poor foot clearance - left;Poor foot clearance - right   holds LUE in flexion, shoulder hiked    Ambulation Surface Level;Indoor    Gait velocity 12.25 sec = 2.68 ft/sec    Gait Comments Cued gait for longer step length, to "not hear feet scuffing the floor", with pt noted to have improved foot clearance and step length, x 60 ft, 2 reps      Standardized Balance Assessment   Standardized Balance Assessment Mini-BESTest;Timed Up and Go Test      Mini-BESTest   Sit To Stand Normal: Comes to stand without use of hands and stabilizes independently.    Rise to Toes < 3 s.   1.74   Stand on one leg (left) Moderate: < 20 s   0.56, 1.94   Stand on one leg (right) Moderate: < 20 s   1.69, 2.13   Stand on one leg - lowest score 1    Compensatory Stepping Correction - Forward Moderate: More than one step is required to recover equilibrium    Compensatory Stepping Correction - Backward No step, OR would fall if not caught, OR falls spontaneously.    Compensatory Stepping Correction - Left Lateral Moderate: Several steps to recover equilibrium    Compensatory Stepping Correction - Right Lateral Moderate: Several steps to recover equilibrium    Stepping Corredtion Lateral - lowest score 1    Stance - Feet together, eyes open, firm surface  Normal: 30s    Stance - Feet together, eyes closed, foam surface  Severe: Unable    Incline -  Eyes Closed Moderate: Stands independently < 30s OR aligns with surface    Change in Gait Speed Normal: Significantly changes walkling speed without imbalance    Walk  with head turns - Horizontal Moderate: performs head turns with reduction in gait speed.    Walk with pivot turns Moderate:Turns with feet close SLOW (>4 steps) with good balance.    Step over obstacles Moderate: Steps over box but touches box OR displays cautious behavior by slowing gait.    Timed UP & GO with Dual Task Moderate: Dual Task affects either counting OR walking (>10%) when compared to the TUG without Dual Task.    Mini-BEST total score 14      Timed Up and Go Test   Normal TUG (seconds) 17.03    Manual TUG (seconds) 16.66    Cognitive TUG (seconds) 20.28    TUG Comments Scores >13.5-15 seconds indicates increased fall risk.      RLE Tone   RLE Tone Within Functional Limits      LLE Tone   LLE Tone Mild                      Objective measurements completed on examination: See above findings.               PT Education - 02/28/20 1422    Education Details Eval results, POC    Person(s) Educated Patient    Methods Explanation    Comprehension Verbalized understanding            PT Short Term Goals - 02/28/20 1433      PT SHORT TERM GOAL #1   Title Pt will be independent with HEP for improved strength, balance, transfers, and gait.  TARGET 03/27/2020 (may be delayed due to scheduling)    Time 4    Period Weeks    Status New      PT SHORT TERM GOAL #2   Title Pt will improve 5x sit<>stand to less than or equal to 12.5 sec to demonstrate improved functional strength and transfer efficiency.    Baseline 15.85    Time 4    Period Weeks    Status New      PT SHORT TERM GOAL #3   Title Pt will improve TUG score to less than or equal to 13.5 seconds for decreased fall risk.    Baseline 17.03    Time 4    Period Weeks    Status New      PT SHORT TERM GOAL #4   Title Pt  will recover posterior balance in push and release test in 2 or less steps independently, for improved balance recovery.    Baseline would fall if unaided at eval    Time 4    Period Weeks    Status New      PT SHORT TERM GOAL #5   Title Pt will verbalize understanding of tips to reduce freezing with gait, steps, and turns.    Baseline reports freezing at times with gait and with 2 steps/no rail to enter home    Time 4    Period Weeks    Status New             PT Long Term Goals - 02/28/20 1437      PT LONG TERM GOAL #1   Title Pt will be independent with HEP for improved strength, balance, transfers, and gait. 04/24/2020    Time 8    Period Weeks    Status New      PT LONG TERM GOAL #2   Title Pt will perform 8 of 10 reps of sit<>stand  from <18" surface with minimal to no UE support and no LOB, for improved functional strength and transfer efficiency.    Time 8    Period Weeks    Status New      PT LONG TERM GOAL #3   Title Pt will improve TUG cognitive score to less than or equal to 15 seconds for decreased fall risk.    Baseline 20.28 sec    Time 8    Period Weeks    Status New      PT LONG TERM GOAL #4   Title Pt will improve MiniBESTest score to at least 20/28 for decreased fall risk.    Baseline 14/28 at eval    Time 8    Period Weeks    Status New      PT LONG TERM GOAL #5   Title Pt will ambulate at least 1000 ft, indoors/outdoor surfaces independently (versus appropriate assistive device) for improved community gait.    Time 8    Period Weeks    Status New      Additional Long Term Goals   Additional Long Term Goals Yes      PT LONG TERM GOAL #6   Title Pt will verbalize understanding of local Parkinson's disease resources, including options for continue community fitness.    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  Pt has recently started Sinemet in the past several weeks.  She presents with decreased balance, decreased functional muscle strength, decreased timing and coordination of gait, decreased ability for dual tasking with gait, bradykinesia, abnormal posture, postural instability.  Pt is at high risk per falls per TUG and MiniBESTest scores.  She is a Theme park manager, working full-time and enjoys walking outside in her driveway for exercise.  She will continue to benefit from skilled PT to address the above stated deficits to decrease fall risk and improve overall functional mobility.    Personal Factors and Comorbidities Comorbidity 3+;Time since onset of injury/illness/exacerbation;Other   Recent addition of Sinemet to medications   Comorbidities glaucoma, osteopenia, TB (as a child)    Examination-Activity Limitations Transfers;Locomotion Level;Stand    Examination-Participation Restrictions Church;Community Activity    Stability/Clinical Decision Making Evolving/Moderate complexity    Clinical Decision Making Moderate    Rehab Potential Good    PT Frequency 2x / week    PT Duration 8 weeks   plus eval   PT Treatment/Interventions ADLs/Self Care Home Management;Gait training;Functional mobility training;Stair training;Therapeutic activities;Therapeutic exercise;Balance training;Neuromuscular re-education;DME Instruction;Patient/family education    PT Next Visit Plan Initiate PWR! Moves in sitting, standing; gait training with focus on posture, step length, and arm swing.    Recommended Other Services Occupational therapy to address UE coordination, handwriting , fine motor (requested OT order in eval to Dr. Jaynee Eagles)           Patient will benefit from skilled therapeutic intervention in order to improve the following deficits and impairments:  Abnormal gait, Difficulty walking, Impaired tone, Decreased balance, Impaired flexibility,  Decreased mobility, Decreased strength, Postural dysfunction  Visit Diagnosis: Other abnormalities of gait and mobility  Unsteadiness on feet  Muscle weakness (generalized)  Abnormal posture  Other symptoms and signs  involving the nervous system     Problem List Patient Active Problem List   Diagnosis Date Noted  . Parkinson's disease ([place]) 02/17/2020  . Gait abnormality 12/04/2019  . Prediabetes 12/03/2018  . Vitamin D deficiency 12/03/2018  . Anemia due to vitamin B12 deficiency 12/03/2018  . Fatigue 12/03/2018  . Urinary frequency 10/01/2018  . Vitamin B12 deficiency 10/01/2018  . 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 Osteopenia 08/23/2012    Gisela Lea W. 02/28/2020, 2:42 PM  Frazier Butt., PT   [facility] [address] [place], [place], [address] Phone: [phone]   Fax:  [phone]  Name: Braedyn Riggle MRN: [id number] Date of Birth: Feb 10, 1951

## 2020-03-02 ENCOUNTER — Ambulatory Visit: Payer: 59 | Admitting: Physical Therapy

## 2020-03-02 ENCOUNTER — Telehealth: Payer: Self-pay | Admitting: Physical Therapy

## 2020-03-02 ENCOUNTER — Other Ambulatory Visit: Payer: Self-pay

## 2020-03-02 DIAGNOSIS — R2681 Unsteadiness on feet: Secondary | ICD-10-CM

## 2020-03-02 DIAGNOSIS — R2689 Other abnormalities of gait and mobility: Secondary | ICD-10-CM

## 2020-03-02 DIAGNOSIS — R29818 Other symptoms and signs involving the nervous system: Secondary | ICD-10-CM

## 2020-03-02 DIAGNOSIS — M6281 Muscle weakness (generalized): Secondary | ICD-10-CM

## 2020-03-02 DIAGNOSIS — R293 Abnormal posture: Secondary | ICD-10-CM

## 2020-03-02 NOTE — Telephone Encounter (Signed)
Dr. Jaynee Eagles, [name] was evaluated by PT at [facility]. The patient would benefit from an OT evaluation for  UE coordination, handwriting, fine motor tasks.   If you agree, please place an order in [facility] workque in [facility] or fax the order to [phone]. Thank you, [name], PT, DPT 03/02/20 6:57 PM   [facility] [address] [place], [place]  [address] Phone:  [phone] Fax:  [phone]

## 2020-03-02 NOTE — Therapy (Signed)
[facility] [address] [place], [place], [address] Phone: [phone]   Fax:  [phone]  Physical Therapy Treatment  Patient Details  Name: Denise [name] MRN: [id number] Date of Birth: [birth year] Referring Provider (PT): Dr. Jaynee Eagles   Encounter Date: 03/02/2020   PT End of Session - 03/02/20 1312    Visit Number 2    Number of Visits 17    Date for PT Re-Evaluation 63/33/54   90 day cert for 8-wk [place]; VL 30    PT Start Time 1230    PT Stop Time 1311    PT Time Calculation (min) 41 min    Activity Tolerance Patient tolerated treatment well    Behavior During Therapy [facility] for tasks assessed/performed           Past Medical History:  Diagnosis Date  . Chronic kidney disease    as a child, no present issues   . Glaucoma   . Osteopenia 06/2018   T score -1.9 FRAX 3% / 0.2%  . 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 TUBAL LIGATION      There were no vitals filed for this visit.   Subjective Assessment - 03/02/20 1233    Subjective No changes/no falls since eval on Friday. Being more attentive to pick her feet up.    Pertinent History glaucoma, osteopenia, TB (as a child)    Patient Stated Goals Pt's goals for therapy are to get stronger in legs and hands.    Currently in Pain? No/denies                    NMR:     Pt performs PWR! Moves in sitting position:    PWR! Up for improved posture 2 x 10 reps   PWR! Rock for improved weighshifting 2 x 5 reps B, alternating sides, cues to look up at hand   PWR! Twist for improved trunk rotation 2 x 5 reps B cues to reset tall in the middle before twisting to opposite side.   PWR! Step for improved step initiation 2 x 5 reps B, step out and out and then step in and in, cues for incr foot clearance, esp stepping out to the L   Demo and verbal cues provided for  technique, intensity of movement.  Cues throughout to perform with wide hands/fingers. Pt rating RPE at 7/10 when working. Educated patient on purpose and rationale of each exercise and how it relates to functional mobility/impairments related to PD. Pt reporting feel good with each exercise.        [facility] PT Treatment/Exercise - 03/02/20 0001      Ambulation/Gait   Ambulation/Gait Yes    Ambulation/Gait Assistance 6: Modified independent (Device/Increase time)    Assistive device None    Gait Pattern Step-through pattern;Decreased arm swing - left;Decreased step length - right;Decreased step length - left;Right foot flat;Left foot flat;Shuffle;Festinating;Decreased trunk rotation;Trunk flexed;Narrow base of support;Poor foot clearance - left;Poor foot clearance - right    Ambulation Surface Level;Indoor    Gait Comments cues for heel strike and stride length ambulating in and out of clinic                   PT Education - 03/02/20 1312    Education Details seated PWR moves for HEP. pt reporting incr difficulty with handwriting - discussed OT  referral and purpose of OT (per eval, order was placed to Dr. Jaynee Eagles for OT, however unable to see in [facility], therapist to send another request)    Person(s) Educated Patient    Methods Explanation;Demonstration;Handout;Verbal cues    Comprehension Verbalized understanding;Returned demonstration;Verbal cues required            PT Short Term Goals - 02/28/20 1433      PT SHORT TERM GOAL #1   Title Pt will be independent with HEP for improved strength, balance, transfers, and gait.  TARGET 03/27/2020 (may be delayed due to scheduling)    Time 4    Period Weeks    Status New      PT SHORT TERM GOAL #2   Title Pt will improve 5x sit<>stand to less than or equal to 12.5 sec to demonstrate improved functional strength and transfer efficiency.    Baseline 15.85    Time 4    Period Weeks    Status New      PT SHORT TERM GOAL #3    Title Pt will improve TUG score to less than or equal to 13.5 seconds for decreased fall risk.    Baseline 17.03    Time 4    Period Weeks    Status New      PT SHORT TERM GOAL #4   Title Pt will recover posterior balance in push and release test in 2 or less steps independently, for improved balance recovery.    Baseline would fall if unaided at eval    Time 4    Period Weeks    Status New      PT SHORT TERM GOAL #5   Title Pt will verbalize understanding of tips to reduce freezing with gait, steps, and turns.    Baseline reports freezing at times with gait and with 2 steps/no rail to enter home    Time 4    Period Weeks    Status New             PT Long Term Goals - 02/28/20 1437      PT LONG TERM GOAL #1   Title Pt will be independent with HEP for improved strength, balance, transfers, and gait. 04/24/2020    Time 8    Period Weeks    Status New      PT LONG TERM GOAL #2   Title Pt will perform 8 of 10 reps of sit<>stand from <18" surface with minimal to no UE support and no LOB, for improved functional strength and transfer efficiency.    Time 8    Period Weeks    Status New      PT LONG TERM GOAL #3   Title Pt will improve TUG cognitive score to less than or equal to 15 seconds for decreased fall risk.    Baseline 20.28 sec    Time 8    Period Weeks    Status New      PT LONG TERM GOAL #4   Title Pt will improve MiniBESTest score to at least 20/28 for decreased fall risk.    Baseline 14/28 at eval    Time 8    Period Weeks    Status New      PT LONG TERM GOAL #5   Title Pt will ambulate at least 1000 ft, indoors/outdoor surfaces independently (versus appropriate assistive device) for improved community gait.    Time 8    Period Weeks    Status  New      Additional Long Term Goals   Additional Long Term Goals Yes      PT LONG TERM GOAL #6   Title Pt will verbalize understanding of local Parkinson's disease resources, including options for continue  community fitness.    Time 8    Period Weeks    Status New                  Patient will benefit from skilled therapeutic intervention in order to improve the following deficits and impairments:     Visit Diagnosis: Other abnormalities of gait and mobility  Unsteadiness on feet  Muscle weakness (generalized)  Abnormal posture  Other symptoms and signs involving the nervous system     Problem List Patient Active Problem List   Diagnosis Date Noted  . Parkinson's disease ([place]) 02/17/2020  . Gait abnormality 12/04/2019  . Prediabetes 12/03/2018  . Vitamin D deficiency 12/03/2018  . Anemia due to vitamin B12 deficiency 12/03/2018  . Fatigue 12/03/2018  . Urinary frequency 10/01/2018  . Vitamin B12 deficiency 10/01/2018  . 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 Osteopenia 08/23/2012    Arliss Journey, PT, DPT  03/02/2020, 1:19 PM  [facility] [address] [place], [place], [address] Phone: [phone]   Fax:  [phone]  Name: Denise [name] MRN: [id number] Date of Birth: 1950-10-04

## 2020-03-02 NOTE — Patient Instructions (Addendum)
° °  PWR Up x10 reps Step, Twist, Step - x5 reps B  1-2x per day.

## 2020-03-03 ENCOUNTER — Other Ambulatory Visit: Payer: Self-pay | Admitting: Neurology

## 2020-03-03 DIAGNOSIS — Z736 Limitation of activities due to disability: Secondary | ICD-10-CM

## 2020-03-03 DIAGNOSIS — R278 Other lack of coordination: Secondary | ICD-10-CM

## 2020-03-03 NOTE — Telephone Encounter (Signed)
Of course, THANK YOU ! Done. Denise [name]

## 2020-03-05 ENCOUNTER — Ambulatory Visit: Payer: 59 | Attending: Neurology | Admitting: Physical Therapy

## 2020-03-05 ENCOUNTER — Encounter: Payer: Self-pay | Admitting: Physical Therapy

## 2020-03-05 ENCOUNTER — Other Ambulatory Visit: Payer: Self-pay

## 2020-03-05 DIAGNOSIS — M6281 Muscle weakness (generalized): Secondary | ICD-10-CM | POA: Diagnosis present

## 2020-03-05 DIAGNOSIS — R29818 Other symptoms and signs involving the nervous system: Secondary | ICD-10-CM

## 2020-03-05 DIAGNOSIS — R293 Abnormal posture: Secondary | ICD-10-CM | POA: Diagnosis present

## 2020-03-05 DIAGNOSIS — R278 Other lack of coordination: Secondary | ICD-10-CM | POA: Insufficient documentation

## 2020-03-05 DIAGNOSIS — R2689 Other abnormalities of gait and mobility: Secondary | ICD-10-CM | POA: Diagnosis not present

## 2020-03-05 DIAGNOSIS — R2681 Unsteadiness on feet: Secondary | ICD-10-CM | POA: Diagnosis present

## 2020-03-05 NOTE — Patient Instructions (Signed)
PELVIC TILT: Anterior    Start in slumped position. Roll pelvis forward to arch back. 10 reps per set, 3 sets per day, 7 days per week   Copyright  VHI. All rights reserved.   Exercise for stooped posture    Stand, back to wall with head, shoulders, buttocks and heels all touching wall. Hold position 30___ seconds, then take two steps away from wall. Step back to wall and correct position if needed. Repeat __2__ times. Do _2___ sessions per day.  CAN PLACE CHAIR IN FRONT OF YOU FOR STABILITY   http://[email or web address].exer.us/688   Copyright  VHI. All rights reserved.

## 2020-03-05 NOTE — Therapy (Signed)
[facility] [address] [place], [place], [address] Phone: [phone]   Fax:  [phone]  Physical Therapy Treatment  Patient Details  Name: Denise [name] MRN: [id number] Date of Birth: 05-20-1951 Referring Provider (PT): Dr. 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   Subjective Assessment - 03/05/20 1236    Subjective Pt reports feeling "really good" after last session.  Denies falls or changes.    Pertinent History glaucoma, osteopenia, TB (as a child)    Patient Stated Goals Pt's goals for therapy are to get stronger in legs and hands.    Currently in Pain? No/denies                             [facility] Adult PT Treatment/Exercise - 03/05/20 0001      Transfers   Transfers Sit to Stand;Stand to Sit    Sit to Stand 5: Supervision    Stand to Sit 5: Supervision      Ambulation/Gait   Ambulation/Gait Yes    Ambulation/Gait Assistance 5: Supervision    Ambulation/Gait Assistance Details in/out of clinic and between activities    Assistive device None    Gait Pattern Step-through pattern;Decreased arm swing - left;Decreased step length - right;Decreased step length - left;Right foot flat;Left  foot flat;Shuffle;Festinating;Decreased trunk rotation;Trunk flexed;Narrow base of support;Poor foot clearance - left;Poor foot clearance - right    Ambulation Surface Level;Indoor      Posture/Postural Control   Posture/Postural Control Postural limitations    Postural Limitations Forward head;Rounded Shoulders    Posture Comments Performed seated anterior/posterior tilts x 15 reps.  Pt tends to sit with significant posterior tilt but can correct with cuest.  Provided as HEP.  Performed standing at wall for upright posture x 60 sec x 2 reps.  Provided as HEP and discussed proper posture with pt.      Exercises   Exercises Knee/Hip      Knee/Hip Exercises: Aerobic   Nustep Workload 1 all 4 extremities x 8 minutes with steps per minute>75 through out.               [facility]) - 03/05/20 1559    PWR! exercises Moves in sitting    PWR! Up 10    PWR! Rock 10    PWR! Twist 10    PWR! Step 10    Comments Pt required verbal tactile and visual cues for technique and intensity.  Tends to have difficulty with multiple steps and remembering all components.                 PT Short Term Goals - 02/28/20 1433      PT SHORT TERM GOAL #1   Title Pt will be independent with HEP for improved strength, balance, transfers, and gait.  TARGET 03/27/2020 (may be delayed due to scheduling)    Time 4    Period Weeks    Status New      PT SHORT TERM GOAL #2   Title Pt will improve 5x sit<>stand to less than or equal to 12.5 sec to demonstrate improved functional strength and transfer efficiency.    Baseline 15.85    Time 4    Period Weeks    Status New      PT SHORT TERM GOAL #3   Title Pt will improve TUG score to less than or equal to 13.5 seconds for decreased fall risk.    Baseline 17.03    Time 4    Period Weeks    Status New      PT SHORT TERM GOAL #4   Title Pt will recover posterior balance in push and release test in 2 or less steps independently, for improved balance  recovery.    Baseline would fall if unaided at eval    Time 4    Period Weeks    Status New      PT SHORT TERM GOAL #5   Title Pt will verbalize understanding of tips to reduce freezing with gait, steps, and turns.    Baseline reports freezing at times with gait and with 2 steps/no rail to enter home    Time 4    Period Weeks    Status New             PT Long Term Goals - 02/28/20 1437      PT LONG TERM GOAL #1   Title Pt will be independent with HEP for improved strength, balance, transfers, and gait. 04/24/2020    Time 8    Period Weeks    Status New      PT LONG TERM GOAL #2   Title Pt will perform 8 of 10 reps of sit<>stand from <18" surface with minimal to no UE support and no LOB, for improved functional strength and transfer efficiency.    Time 8    Period Weeks    Status New      PT LONG TERM GOAL #3   Title Pt will improve TUG cognitive score to less than or equal to 15 seconds for decreased fall risk.    Baseline 20.28 sec    Time 8    Period Weeks    Status New      PT LONG TERM GOAL #4   Title Pt will improve MiniBESTest score to at least 20/28 for decreased fall risk.    Baseline 14/28 at eval    Time 8    Period Weeks    Status New      PT LONG TERM GOAL #5   Title Pt will ambulate at least 1000 ft, indoors/outdoor surfaces independently (versus appropriate assistive device) for improved community gait.    Time 8    Period Weeks    Status New      Additional Long Term Goals   Additional Long Term Goals Yes      PT LONG TERM GOAL #6   Title Pt  will verbalize understanding of local Parkinson's disease resources, including options for continue community fitness.    Time 8    Period Weeks    Status New                 Plan - 03/05/20 1601    Clinical Impression Statement Session focused on endurance, posture and PWR Moves in sitting.  Pt responds wells to cues for technique and posture.  Cont PT per POC.    Personal Factors and  Comorbidities Comorbidity 3+;Time since onset of injury/illness/exacerbation;Other   Recent addition of Sinemet to medications   Comorbidities glaucoma, osteopenia, TB (as a child)    Examination-Activity Limitations Transfers;Locomotion Level;Stand    Examination-Participation Restrictions Church;Community Activity    Stability/Clinical Decision Making Evolving/Moderate complexity    Rehab Potential Good    PT Frequency 2x / week    PT Duration 8 weeks   plus eval   PT Treatment/Interventions ADLs/Self Care Home Management;Gait training;Functional mobility training;Stair training;Therapeutic activities;Therapeutic exercise;Balance training;Neuromuscular re-education;DME Instruction;Patient/family education    PT Next Visit Plan Review Postural HEP provided last session, trial standing PWR moves; gait training with focus on posture, step length, and arm swing.    Consulted and Agree with Plan of Care Patient           Patient will benefit from skilled therapeutic intervention in order to improve the following deficits and impairments:  Abnormal gait, Difficulty walking, Impaired tone, Decreased balance, Impaired flexibility, Decreased mobility, Decreased strength, Postural dysfunction  Visit Diagnosis: Other abnormalities of gait and mobility  Unsteadiness on feet  Muscle weakness (generalized)  Abnormal posture  Other symptoms and signs involving the nervous system     Problem List Patient Active Problem List   Diagnosis Date Noted   Parkinson's disease ([place]) 02/17/2020   Gait abnormality 12/04/2019   Prediabetes 12/03/2018   Vitamin D deficiency 12/03/2018   Anemia due to vitamin B12 deficiency 12/03/2018   Fatigue 12/03/2018   Urinary frequency 10/01/2018   Vitamin B12 deficiency 10/01/2018   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

## 2020-03-10 ENCOUNTER — Ambulatory Visit: Payer: 59 | Admitting: Occupational Therapy

## 2020-03-11 ENCOUNTER — Other Ambulatory Visit: Payer: Self-pay

## 2020-03-11 DIAGNOSIS — M7989 Other specified soft tissue disorders: Secondary | ICD-10-CM

## 2020-03-13 ENCOUNTER — Other Ambulatory Visit: Payer: Self-pay

## 2020-03-13 ENCOUNTER — Ambulatory Visit: Payer: 59 | Admitting: Occupational Therapy

## 2020-03-13 ENCOUNTER — Encounter: Payer: Self-pay | Admitting: Occupational Therapy

## 2020-03-13 DIAGNOSIS — R2681 Unsteadiness on feet: Secondary | ICD-10-CM

## 2020-03-13 DIAGNOSIS — R293 Abnormal posture: Secondary | ICD-10-CM

## 2020-03-13 DIAGNOSIS — R278 Other lack of coordination: Secondary | ICD-10-CM

## 2020-03-13 DIAGNOSIS — R2689 Other abnormalities of gait and mobility: Secondary | ICD-10-CM | POA: Diagnosis not present

## 2020-03-13 DIAGNOSIS — R29818 Other symptoms and signs involving the nervous system: Secondary | ICD-10-CM

## 2020-03-13 NOTE — Therapy (Signed)
[facility] [address] [place], [place], [address] Phone: [phone]   Fax:  [phone]  Occupational Therapy Evaluation  Patient Details  Name: Denise [name] MRN: [id number] Date of Birth: January 22, 1951 Referring Provider (OT): Dr. Jaynee Eagles   Encounter Date: 03/13/2020   OT End of Session - 03/13/20 1358    Visit Number 1    Number of Visits 9    Date for OT Re-Evaluation 05/08/20    Authorization Type [employer]    OT Start Time 1237    OT Stop Time 1315    OT Time Calculation (min) 38 min    Behavior During Therapy [facility] for tasks assessed/performed           Past Medical History:  Diagnosis Date  . Chronic kidney disease    as a child, no present issues   . Glaucoma   . Osteopenia 06/2018   T score -1.9 FRAX 3% / 0.2%  . 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 TUBAL LIGATION      There were no vitals filed for this visit.   Subjective Assessment - 03/13/20 1241    Subjective  Pt reports she has not taken her medication yet as she has not eaten, therapist discussed with pt not to take meds with protein    Patient Stated Goals improve handwriting,    Currently in Pain? No/denies             [facility] OT Assessment - 03/13/20 1245      Assessment   Medical Diagnosis Parkinson's disease    Referring Provider (OT) Dr. Jaynee Eagles    Onset Date/Surgical Date 03/03/20    Hand Dominance Right      Precautions   Precautions Fall      Balance Screen   Has the patient fallen in the past 6 months No    Has the patient had a decrease in activity level because of a fear of falling?  No    Is the patient reluctant to leave their home because of a fear of falling?  No      Home  Environment   Family/patient expects to be discharged to: Private residence    [place] One level    Alternate Level Stairs - Number of Steps 1    Bathroom  Shower/Tub Walk-in Shower    Lives With Alone      Prior Function   Level of Independence Independent    Vocation Full time employment    Systems analyst; has to stand for [employer] Enjoys walking (long driveway), bowling      ADL   Eating/Feeding Modified independent   difficulty cutting food   Grooming Modified independent    Upper Body Bathing Modified independent   increased time   Lower Body Bathing Modified independent   increased time   Upper Body Dressing Increased time;Independent    Lower Body Dressing Increased time;Modified independent    Toilet Transfer Modified independent    Tub/Shower Transfer Modified independent    ADL comments increased time for ADLs      IADL   Shopping Takes care of all shopping needs independently    [place] alone or with occasional assistance    Meal Prep Able to complete simple warm meal prep    Medication Management Is responsible for taking medication  in correct dosages at correct time    Financial Management Manages financial matters independently (budgets, writes checks, pays rent, bills goes to bank), collects and keeps track of income      Mobility   Mobility Status Independent      Written Expression   Dominant Hand Right    Handwriting 90% legible   moderate micrographia     Vision Assessment   Vision Assessment Vision not tested      Cognition   Overall Cognitive Status Within Functional Limits for tasks assessed      Observation/Other Assessments   Simulated Eating Time (seconds) 12.50    Donning Doffing Jacket Time (seconds) 18.19    Donning Doffing Jacket Comments 3 button/ unbutton 27.69      Posture/Postural Control   Posture/Postural Control Postural limitations    Postural Limitations Forward head;Rounded Shoulders      Coordination   9 Hole Peg Test Right;Left    Right 9 Hole Peg Test 29.09    Left 9 Hole Peg Test 32.87    Box and Blocks RUE 50, LUE 52     Coordination bradykinesia in RUE with box/blocks      ROM / Strength   AROM / PROM / Strength AROM      AROM   Overall AROM  Deficits    Overall AROM Comments RUE shoulder flexion 140, elbow ext -15, LUE shoulder flexion 135, elbow extension -5, remaining ROM grossly WFLs                                OT Long Term Goals - 03/13/20 1346      OT LONG TERM GOAL #1   Title I with PD specific HEP    Time 4    Period Weeks    Status New    Target Date 05/08/20   delayed start in tx     OT LONG TERM GOAL #2   Title Pt will verbalize understanding of adapted strategies to maximize safety and I with ADLs/ IADLs .    Time 4    Period Weeks    Status New      OT LONG TERM GOAL #3   Title Pt will verbalize understanding of ways to prevent future PD related complications and PD community resources.    Time 4    Period Weeks    Status New      OT LONG TERM GOAL #4   Title Pt will write a short paragraph with 100% legibility and no significant decrease in letter size    Time 4    Period Weeks    Status New      OT LONG TERM GOAL #5   Title Pt will demonstrate increased ease with dressing as evidenced  by decreasing PPT#4(don/ doff jacket) to 15 secs or less    Baseline 18.19 secs    Time 4    Period Weeks    Status New      Long Term Additional Goals   Additional Long Term Goals Yes      OT LONG TERM GOAL #6   Title Pt will demonstrate ability to retrieve a lightweight object from overhead shelf with  -10 elbow extension with RUE    Baseline sh flex140, elbow ext -15    Time 4    Period Weeks    Status New  Plan - 03/13/20 1342    Clinical Impression Statement Pt is a 69 year old female who presents to [facility] with history of recently diagnosed Parkinson's disease, 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  She presents with decreased balance, decreased coordination, bradykinesia, abnormal posture, decreased ROM which impedes performance of ADLs/  IADLS, Pt can benefit from skilled OT to address these deficits in order to maximize safety and I with daily activities..    She is a Theme park manager, working full-time and enjoys walking outside in her driveway for exercise.    OT Occupational Profile and History Problem Focused Assessment - Including review of records relating to presenting problem    Occupational performance deficits (Please refer to evaluation for details): ADL's;IADL's;Work;Leisure;Social Participation    Body Structure / Function / Physical Skills ADL;Balance;Mobility;Strength;Flexibility;UE functional use;FMC;Gait;Coordination;Decreased knowledge of precautions;GMC;ROM;Decreased knowledge of use of DME;Dexterity;IADL    Rehab Potential Good    Clinical Decision Making Limited treatment options, no task modification necessary    Comorbidities Affecting Occupational Performance: May have comorbidities impacting occupational performance    Modification or Assistance to Complete Evaluation  No modification of tasks or assist necessary to complete eval    OT Frequency 2x / week    OT Duration 4 weeks    OT Treatment/Interventions Self-care/ADL training;Therapeutic exercise;Balance training;Functional Mobility Training;Manual Therapy;Neuromuscular education;Therapeutic activities;DME and/or AE instruction;Patient/family education;Passive range of motion;Moist Heat;Cognitive remediation/compensation;Energy conservation    Plan PD specific HEP: coordination, PWR! moves, handwriting strategies    Consulted and Agree with Plan of Care Patient           Patient will benefit from skilled therapeutic intervention in order to improve the following deficits and impairments:   Body Structure / Function / Physical Skills: ADL, Balance, Mobility, Strength, Flexibility, UE functional use, FMC, Gait, Coordination, Decreased knowledge of precautions, GMC, ROM, Decreased knowledge of use of DME, Dexterity, IADL       Visit Diagnosis: Other symptoms  and signs involving the nervous system  Other lack of coordination  Other abnormalities of gait and mobility  Unsteadiness on feet  Abnormal posture    Problem List Patient Active Problem List   Diagnosis Date Noted  . Parkinson's disease ([place]) 02/17/2020  . Gait abnormality 12/04/2019  . Prediabetes 12/03/2018  . Vitamin D deficiency 12/03/2018  . Anemia due to vitamin B12 deficiency 12/03/2018  . Fatigue 12/03/2018  . Urinary frequency 10/01/2018  . Vitamin B12 deficiency 10/01/2018  . 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 Osteopenia 08/23/2012    Ritu Gagliardo 03/13/2020, 2:00 PM Theone Murdoch, OTR/L [phone] Phone: [phone] 2:01 PM 03/13/20 [facility] [address] [place], [place], [address] Phone: [phone]   Fax:  [phone]  Name: Brandelyn Henne MRN: [id number] Date of Birth: 28-Mar-1951

## 2020-03-17 ENCOUNTER — Ambulatory Visit: Payer: 59 | Admitting: Physical Therapy

## 2020-03-17 ENCOUNTER — Other Ambulatory Visit: Payer: Self-pay

## 2020-03-17 DIAGNOSIS — R2689 Other abnormalities of gait and mobility: Secondary | ICD-10-CM

## 2020-03-17 DIAGNOSIS — R293 Abnormal posture: Secondary | ICD-10-CM

## 2020-03-17 DIAGNOSIS — R29818 Other symptoms and signs involving the nervous system: Secondary | ICD-10-CM

## 2020-03-17 DIAGNOSIS — R2681 Unsteadiness on feet: Secondary | ICD-10-CM

## 2020-03-17 DIAGNOSIS — R278 Other lack of coordination: Secondary | ICD-10-CM

## 2020-03-17 NOTE — Therapy (Signed)
[facility] [address] [place], [place], [address] Phone: [phone]   Fax:  [phone]  Physical Therapy Treatment  Patient Details  Name: Denise [name] MRN: [id number] Date of Birth: 10/05/1950 Referring Provider (PT): Dr. Jaynee Eagles   Encounter Date: 03/17/2020   PT End of Session - 03/17/20 1314    Visit Number 4    Number of Visits 17    Date for PT Re-Evaluation 53/66/44   90 day cert for 8-wk [place]; VL 30    Authorization - Visit Number 4    Authorization - Number of Visits 30    PT Start Time 1231   3 minutes non billable due to pt using restroom   PT Stop Time 1313    PT Time Calculation (min) 42 min    Activity Tolerance Patient tolerated treatment well    Behavior During Therapy [facility] for tasks assessed/performed           Past Medical History:  Diagnosis Date  . Chronic kidney disease    as a child, no present issues   . Glaucoma   . Osteopenia 06/2018   T score -1.9 FRAX 3% / 0.2%  . 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 TUBAL LIGATION      There were no vitals filed for this visit.   Subjective Assessment - 03/17/20 1233    Subjective Still feeling a little bit stiff in the morning. Exercises are going wonderful. People have even told her that she is walking a little bit straighter.    Pertinent History glaucoma, osteopenia, TB (as a child)    Patient Stated Goals Pt's goals for therapy are to get stronger in legs and hands.                             [facility] Adult PT Treatment/Exercise - 03/17/20 1247      Ambulation/Gait   Ambulation/Gait Yes    Ambulation/Gait Assistance 5: Supervision    Ambulation/Gait Assistance Details cues for posture, picking up feet (heel strike) and incr step length B, during 2nd lap of gait pt beginning to demonstrate recirprocal arm swing B. during 1st lap, pt  with decr arm swing B and needing frequent cues to look up. Attempted walking poles with gait for therapist to facilitate arm swing and trunk rotation, however pt demonstrated smaller steps. Did better without use of walking poles.     Ambulation Distance (Feet) 460 Feet   plus 1 x 230' at end of session   Assistive device None    Gait Pattern Step-through pattern;Decreased arm swing - left;Decreased step length - right;Decreased step length - left;Right foot flat;Left foot flat;Shuffle;Festinating;Decreased trunk rotation;Trunk flexed;Narrow base of support;Poor foot clearance - left;Poor foot clearance - right    Ambulation Surface Level;Indoor      Exercises   Exercises Other Exercises    Other Exercises  Reviewed seated anterior/posterior pelvic tilts from HEP x10 reps. Also reviewed standing at wall for upright posture, 3x 30 seconds, cues for proper technique and to relax shoulders and perform with a wider BOS            Pt performs PWR! Moves in standing position   PWR! Up for improved posture 2 x 10 reps   PWR! Rock for improved weighshifting x10 reps weight shifting through hips and  then adding in arm reach and looking up towards hands 2 x 5 reps B  PWR! Twist for improved trunk rotation 2 x5 reps B  PWR! Step for improved step initiation 2 x 5 reps B, needing verbal and demo cues for incr foot clearance and technique, use of chair in front for fingertips for balance for improved foot clearance   Demo and verbal cues provided for technique and intensity of movement.          PT Education - 03/17/20 1314    Education Details gait training, reviewed postural exercises added to HEP from last session    Person(s) Educated Patient    Methods Explanation;Demonstration;Verbal cues    Comprehension Verbalized understanding;Returned demonstration            PT Short Term Goals - 02/28/20 1433      PT SHORT TERM GOAL #1   Title Pt will be independent with HEP for improved  strength, balance, transfers, and gait.  TARGET 03/27/2020 (may be delayed due to scheduling)    Time 4    Period Weeks    Status New      PT SHORT TERM GOAL #2   Title Pt will improve 5x sit<>stand to less than or equal to 12.5 sec to demonstrate improved functional strength and transfer efficiency.    Baseline 15.85    Time 4    Period Weeks    Status New      PT SHORT TERM GOAL #3   Title Pt will improve TUG score to less than or equal to 13.5 seconds for decreased fall risk.    Baseline 17.03    Time 4    Period Weeks    Status New      PT SHORT TERM GOAL #4   Title Pt will recover posterior balance in push and release test in 2 or less steps independently, for improved balance recovery.    Baseline would fall if unaided at eval    Time 4    Period Weeks    Status New      PT SHORT TERM GOAL #5   Title Pt will verbalize understanding of tips to reduce freezing with gait, steps, and turns.    Baseline reports freezing at times with gait and with 2 steps/no rail to enter home    Time 4    Period Weeks    Status New             PT Long Term Goals - 02/28/20 1437      PT LONG TERM GOAL #1   Title Pt will be independent with HEP for improved strength, balance, transfers, and gait. 04/24/2020    Time 8    Period Weeks    Status New      PT LONG TERM GOAL #2   Title Pt will perform 8 of 10 reps of sit<>stand from <18" surface with minimal to no UE support and no LOB, for improved functional strength and transfer efficiency.    Time 8    Period Weeks    Status New      PT LONG TERM GOAL #3   Title Pt will improve TUG cognitive score to less than or equal to 15 seconds for decreased fall risk.    Baseline 20.28 sec    Time 8    Period Weeks    Status New      PT LONG TERM GOAL #4   Title Pt will improve  MiniBESTest score to at least 20/28 for decreased fall risk.    Baseline 14/28 at eval    Time 8    Period Weeks    Status New      PT LONG TERM GOAL #5    Title Pt will ambulate at least 1000 ft, indoors/outdoor surfaces independently (versus appropriate assistive device) for improved community gait.    Time 8    Period Weeks    Status New      Additional Long Term Goals   Additional Long Term Goals Yes      PT LONG TERM GOAL #6   Title Pt will verbalize understanding of local Parkinson's disease resources, including options for continue community fitness.    Time 8    Period Weeks    Status New                 Plan - 03/17/20 1318    Clinical Impression Statement Today's skilled session focused on gait training and initiating PWR moves in standing.Pt with improvements in gait at end of session with reciprocal arm swing, posture, and step length/foot clearance B. Pt with incr difficulty with foot clearance with standing PWR! step for step initiation - needing single UE support for balance. Pt reports feeling much better at the end of the session. Will continue to progress towards LTGs.    Personal Factors and Comorbidities Comorbidity 3+;Time since onset of injury/illness/exacerbation;Other   Recent addition of Sinemet to medications   Comorbidities glaucoma, osteopenia, TB (as a child)    Examination-Activity Limitations Transfers;Locomotion Level;Stand    Examination-Participation Restrictions Church;Community Activity    Stability/Clinical Decision Making Evolving/Moderate complexity    Rehab Potential Good    PT Frequency 2x / week    PT Duration 8 weeks   plus eval   PT Treatment/Interventions ADLs/Self Care Home Management;Gait training;Functional mobility training;Stair training;Therapeutic activities;Therapeutic exercise;Balance training;Neuromuscular re-education;DME Instruction;Patient/family education    PT Next Visit Plan did pt get scheduled for PT for last 2 weeks of october (back to back with OT?) standing PWR moves; gait training with focus on posture, step length, and arm swing (add walking program for home as  appropriate and maybe try gait with metronome?)    Consulted and Agree with Plan of Care Patient           Patient will benefit from skilled therapeutic intervention in order to improve the following deficits and impairments:  Abnormal gait, Difficulty walking, Impaired tone, Decreased balance, Impaired flexibility, Decreased mobility, Decreased strength, Postural dysfunction  Visit Diagnosis: Other symptoms and signs involving the nervous system  Other lack of coordination  Other abnormalities of gait and mobility  Unsteadiness on feet  Abnormal posture     Problem List Patient Active Problem List   Diagnosis Date Noted  . Parkinson's disease ([place]) 02/17/2020  . Gait abnormality 12/04/2019  . Prediabetes 12/03/2018  . Vitamin D deficiency 12/03/2018  . Anemia due to vitamin B12 deficiency 12/03/2018  . Fatigue 12/03/2018  . Urinary frequency 10/01/2018  . Vitamin B12 deficiency 10/01/2018  . 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 Osteopenia 08/23/2012    Arliss Journey, PT, DPT  03/17/2020, 2:14 PM  [facility] [address] [place], [place], [address] Phone: [phone]   Fax:  [phone]  Name: Denise [name] MRN: [id number] Date of Birth: Mar 26, 1951

## 2020-03-19 ENCOUNTER — Ambulatory Visit: Payer: 59 | Admitting: Physical Therapy

## 2020-03-19 ENCOUNTER — Other Ambulatory Visit: Payer: Self-pay

## 2020-03-19 DIAGNOSIS — R29818 Other symptoms and signs involving the nervous system: Secondary | ICD-10-CM

## 2020-03-19 DIAGNOSIS — R2681 Unsteadiness on feet: Secondary | ICD-10-CM

## 2020-03-19 DIAGNOSIS — R2689 Other abnormalities of gait and mobility: Secondary | ICD-10-CM | POA: Diagnosis not present

## 2020-03-19 NOTE — Therapy (Signed)
[facility] [address] [place], [place], [address] Phone: [phone]   Fax:  [phone]  Physical Therapy Treatment  Patient Details  Name: Denise [name] MRN: [id number] Date of Birth: 04-16-1951 Referring Provider (PT): Dr. Jaynee Eagles   Encounter Date: 03/19/2020   PT End of Session - 03/19/20 2103    Visit Number 5    Number of Visits 17    Date for PT Re-Evaluation 17/00/17   90 day cert for 8-wk [place]; VL 30    Authorization - Visit Number 5    Authorization - Number of Visits 30    PT Start Time 4944    PT Stop Time 1443    PT Time Calculation (min) 41 min    Activity Tolerance Patient tolerated treatment well    Behavior During Therapy [facility] for tasks assessed/performed           Past Medical History:  Diagnosis Date  . Chronic kidney disease    as a child, no present issues   . Glaucoma   . Osteopenia 06/2018   T score -1.9 FRAX 3% / 0.2%  . 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 TUBAL LIGATION      There were no vitals filed for this visit.   Subjective Assessment - 03/19/20 1405    Subjective Exercises going well.  No changes since last visit.    Pertinent History glaucoma, osteopenia, TB (as a child)    Patient Stated Goals Pt's goals for therapy are to get stronger in legs and hands.    Currently in Pain? Yes    Pain Score 2     Pain Location Hip    Pain Orientation Right    Pain Descriptors / Indicators Numbness    Pain Onset More than a month ago    Pain Frequency Intermittent    Aggravating Factors  riding/sitting long periods (getting up into standing)    Pain Relieving Factors improving posture, moving and exercises                             [facility] Adult PT Treatment/Exercise - 03/19/20 0001      Ambulation/Gait   Ambulation/Gait Yes    Ambulation/Gait Assistance 5: Supervision     Ambulation/Gait Assistance Details Cues for posture and step length    Ambulation Distance (Feet) 400 Feet   230, 50 ft x 4, 50 ft x 2   Assistive device None    Gait Pattern Step-through pattern;Decreased arm swing - left;Decreased step length - right;Decreased step length - left;Right foot flat;Left foot flat;Decreased trunk rotation;Trunk flexed;Narrow base of support;Poor foot clearance - left;Poor foot clearance - right    Ambulation Surface Level;Indoor    Gait Comments Cues provided for reciprocal arm swing to coordinate with increased step length.  Discussed walking program for home with attention to posture, arm swing, step length.      Neuro Re-ed    Neuro Re-ed Details  Stagger stance rocking forward and back with added single arm swing (1 UE support) x 10 reps, then reciprocal arm swing with rocking x 5 reps each foot position            Pt performs PWR! Moves in standing position    PWR! Up for improved posture, initial 3 reps slow sustained, then 10 reps  PWR! Rock for improved weighshifting x10 reps weight shifting through hips with added arm reach and looking up towards hands    PWR! Twist for improved trunk rotation 2 x5 reps bilat   PWR! Step for improved step initiation 10 reps bilat, needing verbal and demo cues for incr foot clearance and technique, use of chair in front for fingertips for balance for improved foot clearance    Demo and verbal cues provided for technique and intensity of movement     Balance Exercises - 03/19/20 0001      Balance Exercises: Standing   Stepping Strategy Anterior;Lateral;UE support;10 reps   Stepping over 2" flat obstacle for improved step length   Retro Gait Upper extremity support;3 reps   Forward/back walking at [facility] Solid surface;Upper extremity assist 1;Dynamic;Forwards   4 reps   Other Standing Exercises --             PT Education - 03/19/20 2103    Education Details Walking program for home     Person(s) Educated Patient    Methods Explanation;Demonstration;Handout    Comprehension Verbalized understanding;Returned demonstration            PT Short Term Goals - 02/28/20 1433      PT SHORT TERM GOAL #1   Title Pt will be independent with HEP for improved strength, balance, transfers, and gait.  TARGET 03/27/2020 (may be delayed due to scheduling)    Time 4    Period Weeks    Status New      PT SHORT TERM GOAL #2   Title Pt will improve 5x sit<>stand to less than or equal to 12.5 sec to demonstrate improved functional strength and transfer efficiency.    Baseline 15.85    Time 4    Period Weeks    Status New      PT SHORT TERM GOAL #3   Title Pt will improve TUG score to less than or equal to 13.5 seconds for decreased fall risk.    Baseline 17.03    Time 4    Period Weeks    Status New      PT SHORT TERM GOAL #4   Title Pt will recover posterior balance in push and release test in 2 or less steps independently, for improved balance recovery.    Baseline would fall if unaided at eval    Time 4    Period Weeks    Status New      PT SHORT TERM GOAL #5   Title Pt will verbalize understanding of tips to reduce freezing with gait, steps, and turns.    Baseline reports freezing at times with gait and with 2 steps/no rail to enter home    Time 4    Period Weeks    Status New             PT Long Term Goals - 02/28/20 1437      PT LONG TERM GOAL #1   Title Pt will be independent with HEP for improved strength, balance, transfers, and gait. 04/24/2020    Time 8    Period Weeks    Status New      PT LONG TERM GOAL #2   Title Pt will perform 8 of 10 reps of sit<>stand from <18" surface with minimal to no UE support and no LOB, for improved functional strength and transfer efficiency.    Time 8    Period Weeks    Status  New      PT LONG TERM GOAL #3   Title Pt will improve TUG cognitive score to less than or equal to 15 seconds for decreased fall risk.     Baseline 20.28 sec    Time 8    Period Weeks    Status New      PT LONG TERM GOAL #4   Title Pt will improve MiniBESTest score to at least 20/28 for decreased fall risk.    Baseline 14/28 at eval    Time 8    Period Weeks    Status New      PT LONG TERM GOAL #5   Title Pt will ambulate at least 1000 ft, indoors/outdoor surfaces independently (versus appropriate assistive device) for improved community gait.    Time 8    Period Weeks    Status New      Additional Long Term Goals   Additional Long Term Goals Yes      PT LONG TERM GOAL #6   Title Pt will verbalize understanding of local Parkinson's disease resources, including options for continue community fitness.    Time 8    Period Weeks    Status New                 Plan - 03/19/20 2104    Clinical Impression Statement Skilled PT focused on standing PWR! Moves and standing exercises for arm swing and step length as well as gait training.  With cues for attention to posture, arm swing and step lenght, pt albe to note improved gait and provided instructions fofr walking program for home.  Will need 1-2 more sessions of practice and then give HEP for standing PWR! Moves.    Personal Factors and Comorbidities Comorbidity 3+;Time since onset of injury/illness/exacerbation;Other   Recent addition of Sinemet to medications   Comorbidities glaucoma, osteopenia, TB (as a child)    Examination-Activity Limitations Transfers;Locomotion Level;Stand    Examination-Participation Restrictions Church;Community Activity    Stability/Clinical Decision Making Evolving/Moderate complexity    Rehab Potential Good    PT Frequency 2x / week    PT Duration 8 weeks   plus eval   PT Treatment/Interventions ADLs/Self Care Home Management;Gait training;Functional mobility training;Stair training;Therapeutic activities;Therapeutic exercise;Balance training;Neuromuscular re-education;DME Instruction;Patient/family education    PT Next Visit Plan  Standing PWR moves (add to HEP when appropriate); gait training with focus on posture, step length, and arm swing (review walking program for home)    Consulted and Agree with Plan of Care Patient           Patient will benefit from skilled therapeutic intervention in order to improve the following deficits and impairments:  Abnormal gait, Difficulty walking, Impaired tone, Decreased balance, Impaired flexibility, Decreased mobility, Decreased strength, Postural dysfunction  Visit Diagnosis: Other abnormalities of gait and mobility  Unsteadiness on feet  Other symptoms and signs involving the nervous system     Problem List Patient Active Problem List   Diagnosis Date Noted  . Parkinson's disease ([place]) 02/17/2020  . Gait abnormality 12/04/2019  . Prediabetes 12/03/2018  . Vitamin D deficiency 12/03/2018  . Anemia due to vitamin B12 deficiency 12/03/2018  . Fatigue 12/03/2018  . Urinary frequency 10/01/2018  . Vitamin B12 deficiency 10/01/2018  . 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 Osteopenia 08/23/2012    Kathaleen Dudziak W. 03/19/2020, 9:07 PM  Frazier Butt., PT   [facility] [address] [place], [place], [address] Phone: [phone]   Fax:  [phone]  Name: Ellar Hakala MRN: [id number] Date of Birth: 1950-10-07

## 2020-03-19 NOTE — Patient Instructions (Signed)
WALKING  Walking is a great form of exercise to increase your strength, endurance and overall fitness.  A walking program can help you start slowly and gradually build endurance as you go.  Everyone's ability is different, so each person's starting point will be different.  You do not have to follow them exactly.  The are just samples. You should simply find out what's right for you and stick to that program.   In the beginning, you'll start off walking 2-3 times a day for short distances.  As you get stronger, you'll be walking further at just 1-2 times per day.  A. You Can Walk For A Certain Length Of Time Each Day    Walk 2-3 minutes 3 times per day.  Increase 1-2 minutes every 2 days (3 times per day).  Work up to 15-20 minutes (1-2 times per day).   Example:   Day 1-2 2-3 minutes 3 times per day   Day 7-8 6-8 minutes 2-3 times per day   Day 13-14 12-15 minutes 1-2 times per day  B. You Can Walk For a Certain Distance Each Day     Distance can be substituted for time.  Gradually increase your distance every several days.    Example:   4 laps in the house    4 lengths of the driveway   3 trips around the block

## 2020-03-23 ENCOUNTER — Other Ambulatory Visit: Payer: Self-pay

## 2020-03-23 ENCOUNTER — Ambulatory Visit (HOSPITAL_COMMUNITY)
Admission: RE | Admit: 2020-03-23 | Discharge: 2020-03-23 | Disposition: A | Discharge status: Home / Self Care | Payer: 59 | Source: Ambulatory Visit | Attending: Surgery | Admitting: Surgery

## 2020-03-23 ENCOUNTER — Ambulatory Visit: Payer: 59 | Admitting: Physician Assistant

## 2020-03-23 VITALS — BP 159/88 | HR 49 | Temp 98.8°F | Resp 20 | Ht 66.0 in | Wt 127.7 lb

## 2020-03-23 DIAGNOSIS — M7989 Other specified soft tissue disorders: Secondary | ICD-10-CM | POA: Diagnosis not present

## 2020-03-23 NOTE — Progress Notes (Signed)
Requested by:  Minette Brine, [place],  [place] [address]  Reason for consultation: Bilateral lower extremity edema  History of Present Illness   Denise [name] is a 69 y.o. (1950/09/08) female who presents for evaluation of bilateral lower ankle and foot edema. The patient states this is worse in the evening and has resolved with elevation. She denies lower extremity pain claudication or rest pain. No history of cardiac disease. Her daughter accompanies her today. She states her mother was treated recently for skin condition of the lower legs and the steroid cream prescribed caused an allergic reaction. This has been discontinued and the patient and her daughter state her skin is much improved.  Venous symptoms include: bilateral ankle and foot swelling. Negative bleeding, ulcer  Onset/duration:  6 months Occupation:  Local [employer] Aggravating factors:  + sitting, standing Alleviating factors:  + elevation Compression:  yes Helps:  intermittently Pain medications:  none Previous vein procedures:  None  History of DVT:  no  Recent diagnosis of Parkinson's disease  Past Medical History:  Diagnosis Date  . Chronic kidney disease    as a child, no present issues   . Glaucoma   . Osteopenia 06/2018   T score -1.9 FRAX 3% / 0.2%  . 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 TUBAL LIGATION      Social History   Socioeconomic History  . Marital status: Single    Spouse name: Not on file  . Number of children: 3  . Years of education: Not on file  . Highest education level: Master's degree (e.g., MA, MS, MEng, MEd, MSW, MBA)  Occupational History  . Not on file  Tobacco Use  . Smoking status: Never Smoker  . Smokeless tobacco: Never Used  Vaping Use  . Vaping Use: Never used  Substance and Sexual Activity  . Alcohol use: Never  . Drug use: No  . Sexual activity: Yes    Birth control/protection:  Post-menopausal  Other Topics Concern  . Not on file  Social History Narrative   Lives alone   Right handed   Caffeine: none   Social Determinants of Health   Financial Resource Strain:   . Difficulty of Paying Living Expenses: Not on file  Food Insecurity:   . Worried About Charity fundraiser in the Last Year: Not on file  . Ran Out of Food in the Last Year: Not on file  Transportation Needs:   . Lack of Transportation (Medical): Not on file  . Lack of Transportation (Non-Medical): Not on file  Physical Activity:   . Days of Exercise per Week: Not on file  . Minutes of Exercise per Session: Not on file  Stress:   . Feeling of Stress : Not on file  Social Connections:   . Frequency of Communication with Friends and Family: Not on file  . Frequency of Social Gatherings with Friends and Family: Not on file  . Attends Religious Services: Not on file  . Active Member of Clubs or Organizations: Not on file  . Attends Archivist Meetings: Not on file  . Marital Status: Not on file  Intimate Partner Violence:   . Fear of Current or Ex-Partner: Not on file  . Emotionally Abused: Not on file  . Physically Abused: Not on file  . Sexually Abused: Not on file    Family History  Problem Relation  Age of Onset  . Hypertension Mother   . Heart disease Mother   . Diabetes Father   . Hypertension Father   . Other Father        had a condition that was "a cousin" to ALS   . Diabetes Sister   . Tuberculosis Maternal Grandmother   . Heart disease Sister   . Colitis Daughter   . Colon cancer Neg Hx   . Colon polyps Neg Hx   . Esophageal cancer Neg Hx   . Rectal cancer Neg Hx   . Stomach cancer Neg Hx     Current Outpatient Medications  Medication Sig Dispense Refill  . carbidopa-levodopa (SINEMET IR) 25-100 MG tablet Take 1 tablet by mouth 3 (three) times daily. Take 4 hours apart such as 8am, noon and 4pm. Separate from protein. Can each with crackers or bread or  juice. 60 tablet 2  . Cholecalciferol (VITAMIN D3) 10 MCG (400 UNIT) CAPS Take 1 capsule by mouth daily. Pt unaware of dose    . hydrochlorothiazide (HYDRODIURIL) 12.5 MG tablet TAKE 1 TABLET BY MOUTH EVERY DAY 90 tablet 1  . timolol (BETIMOL) 0.25 % ophthalmic solution 1-2 drops daily.     . Travoprost, BAK Free, (TRAVATAN) 0.004 % SOLN ophthalmic solution 1 drop at bedtime.    . triamcinolone ointment (KENALOG) 0.5 % APPLY TO AFFECTED AREA TWICE A DAY (Patient not taking: Reported on 02/28/2020) 30 g 0   No current facility-administered medications for this visit.    Allergies  Allergen Reactions  . Nystatin-Triamcinolone Swelling    BLISTERS  . Cephalexin Itching and Rash    REVIEW OF SYSTEMS (negative unless checked):   Cardiac:  []  Chest pain or chest pressure? []  Shortness of breath upon activity? []  Shortness of breath when lying flat? []  Irregular heart rhythm?  Vascular:  []  Pain in calf, thigh, or hip brought on by walking? []  Pain in feet at night that wakes you up from your sleep? []  Blood clot in your veins? [x]  Leg swelling?  Pulmonary:  []  Oxygen at home? []  Productive cough? []  Wheezing?  Neurologic:  []  Sudden weakness in arms or legs? []  Sudden numbness in arms or legs? []  Sudden onset of difficult speaking or slurred speech? []  Temporary loss of vision in one eye? []  Problems with dizziness?  Gastrointestinal:  []  Blood in stool? []  Vomited blood?  Genitourinary:  []  Burning when urinating? []  Blood in urine?  Psychiatric:  []  Major depression  Hematologic:  []  Bleeding problems? []  Problems with blood clotting?  Dermatologic:  []  Rashes or ulcers?  Constitutional:  []  Fever or chills?  Ear/Nose/Throat:  []  Change in hearing? []  Nose bleeds? []  Sore throat?  Musculoskeletal:  []  Back pain? []  Joint pain? []  Muscle pain?   Physical Examination     Vitals:   03/23/20 1239  BP: (!) 159/88  Pulse: (!) 49  Resp: 20  Temp:  98.8 F (37.1 C)  SpO2: 100%   General:  WDWN in NAD; vital signs documented above Gait: Not observed HENT: WNL, normocephalic Pulmonary: normal non-labored breathing , without Rales, rhonchi,  wheezing Cardiac: regular HR, without  Murmurs without carotid bruits Abdomen: soft, NT, no masses Skin: without rashes Vascular Exam/Pulses:  Right Left  Radial 2+ (normal) 2+ (normal)  Ulnar  not evaluated  not evaluated  Femoral 2+ (normal) 2+ (normal)  Popliteal 1+ (weak) 1+ (weak)  DP 2+ (normal) 2+ (normal)  PT  not palpable  not palpable  Extremities: without varicose veins, without reticular veins, with edema, without stasis pigmentation, without lipodermatosclerosis, without ulcers Musculoskeletal: no muscle wasting or atrophy  Neurologic: A&O X 3;  No focal weakness or paresthesias are detected Psychiatric:  The pt has Normal affect.  Non-invasive Vascular Imaging   BLE Venous Insufficiency Duplex (03/23/2020):  Right:  - No evidence of deep vein thrombosis from the common femoral through the  popliteal veins.  - No evidence of superficial venous thrombosis.  - The deep venous system is not competent.  - The great saphenous vein is competent.  - The small saphenous vein is competent.    Left:  - No evidence of deep vein thrombosis from the common femoral through the  popliteal veins.  - No evidence of superficial venous thrombosis.  - The great saphenous vein is competent.  - The small saphenous vein is competent.  - The deep system is competent.    Medical Decision Making    Denise [name] is a 69 y.o. female who presents with: BLE chronic venous insufficiency, right lower extremity deep venous incompetency, reflux. No evidence of DVT. No suggestion of arterial insufficiency.   Based on the patient's history and examination, I recommend: Elevation and continued use of compressive stockings. We discussed applying these prior to getting out of bed in the  morning.  Recommend daily elevation and monitoring lower extremity skin for nonhealing wounds.  Provided patient with written education material. Follow-up as necessary.  Thank you for allowing [place] to participate in this patient's care.   Barbie Banner, PA-C [facility] Office: [phone]  03/23/2020, 12:20 PM  Clinic MD: Trula Slade

## 2020-03-24 ENCOUNTER — Ambulatory Visit: Payer: 59 | Admitting: Physical Therapy

## 2020-03-26 ENCOUNTER — Ambulatory Visit: Payer: 59 | Admitting: Physical Therapy

## 2020-03-26 ENCOUNTER — Other Ambulatory Visit: Payer: Self-pay

## 2020-03-26 ENCOUNTER — Encounter: Payer: Self-pay | Admitting: Physical Therapy

## 2020-03-26 DIAGNOSIS — R2681 Unsteadiness on feet: Secondary | ICD-10-CM

## 2020-03-26 DIAGNOSIS — R2689 Other abnormalities of gait and mobility: Secondary | ICD-10-CM | POA: Diagnosis not present

## 2020-03-26 DIAGNOSIS — R293 Abnormal posture: Secondary | ICD-10-CM
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
## 2020-03-26 NOTE — Patient Instructions (Signed)
  Perform once per day, 10-20 reps (provided paper handout)

## 2020-03-30 ENCOUNTER — Encounter: Payer: Self-pay | Admitting: Physical Therapy

## 2020-03-30 ENCOUNTER — Ambulatory Visit: Payer: 59 | Admitting: Physical Therapy

## 2020-03-30 ENCOUNTER — Other Ambulatory Visit: Payer: Self-pay

## 2020-03-30 DIAGNOSIS — R2689 Other abnormalities of gait and mobility: Secondary | ICD-10-CM | POA: Diagnosis not present

## 2020-03-30 DIAGNOSIS — R2681 Unsteadiness on feet: Secondary | ICD-10-CM

## 2020-03-30 DIAGNOSIS — R293 Abnormal posture: Secondary | ICD-10-CM

## 2020-03-30 NOTE — Therapy (Signed)
[facility] [address] [place], [place], [address] Phone: [phone]   Fax:  [phone]  Physical Therapy Treatment  Patient Details  Name: Denise [name] MRN: [id number] Date of Birth: 01/20/1951 Referring Provider (PT): Dr. Ahern   Encounter Date: 03/30/2020   PT End of Session - 03/30/20 1604    Visit Number 7    Number of Visits 17    Date for PT Re-Evaluation 05/28/20   90 day cert for 8-wk POC   Authorization Type [employer]; VL 30    Authorization - Visit Number 7    Authorization - Number of Visits 30    PT Start Time 1233    PT Stop Time 1315    PT Time Calculation (min) 42 min    Activity Tolerance Patient tolerated treatment well    Behavior During Therapy WFL for tasks assessed/performed           Past Medical History:  Diagnosis Date  . Chronic kidney disease    as a child, no present issues   . Glaucoma   . Osteopenia 06/2018   T score -1.9 FRAX 3% / 0.2%  . 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 TUBAL LIGATION      There were no vitals filed for this visit.   Subjective Assessment - 03/30/20 1239    Subjective Denies any falls or changes.  Denies any pain.  States she was going to a chiropractor 1x/week prior to starting PT because he said she was "out of alignment" but denies any pain.    Pertinent History glaucoma, osteopenia, TB (as a child)    Patient Stated Goals Pt's goals for therapy are to get stronger in legs and hands.    Currently in Pain? No/denies    Pain Onset More than a month ago                             [facility] Adult PT Treatment/Exercise - 03/30/20 0001      Transfers   Transfers Sit to Stand;Stand to Sit    Sit to Stand 5: Supervision    Five time sit to stand comments  13.40    Stand to Sit 5: Supervision    Comments tends to keep bil knees and hips bent upon standing and also places LLE in  increased flexion, almost under chair to stabilitze to stand      Posture/Postural Control   Posture/Postural Control Postural limitations    Postural Limitations Forward head;Rounded Shoulders    Posture Comments Pt tends to sit with significant posterior tilt but can correct with cues.    Performed standing at wall for upright posture x 60 sec x 2 reps.      Standardized Balance Assessment   Standardized Balance Assessment Timed Up and Go Test      Timed Up and Go Test   TUG Normal TUG    Normal TUG (seconds) 9.47      Exercises   Exercises Other Exercises    Other Exercises  seated anterior/posterior pelvic tilts x 15 then AA x 15 with PTA using sheet.  Performed seated edge of mat.             PWR ([facility]) - 03/30/20 1602    PWR! exercises Moves in sitting    PWR! Up 20 sitting    PWR!   Rock 20 sitting    PWR! Twist 20 sitting    PWR! Step 20 sitting    Comments Needed verbal and visual cues for technique and intensity                 PT Short Term Goals - 03/30/20 1248      PT SHORT TERM GOAL #1   Title Pt will be independent with HEP for improved strength, balance, transfers, and gait.  TARGET 03/27/2020 (may be delayed due to scheduling)    Time 4    Period Weeks    Status Achieved      PT SHORT TERM GOAL #2   Title Pt will improve 5x sit<>stand to less than or equal to 12.5 sec to demonstrate improved functional strength and transfer efficiency.    Baseline 13.40 seconds 03/30/20    Time 4    Period Weeks    Status Not Met      PT SHORT TERM GOAL #3   Title Pt will improve TUG score to less than or equal to 13.5 seconds for decreased fall risk.    Baseline 9.47 seconds    Time 4    Period Weeks    Status Achieved      PT SHORT TERM GOAL #4   Title Pt will recover posterior balance in push and release test in 2 or less steps independently, for improved balance recovery.    Baseline would fall if unaided at eval    Time 4    Period Weeks    Status  New      PT SHORT TERM GOAL #5   Title Pt will verbalize understanding of tips to reduce freezing with gait, steps, and turns.    Baseline reports freezing at times with gait and with 2 steps/no rail to enter home    Time 4    Period Weeks    Status New             PT Long Term Goals - 02/28/20 1437      PT LONG TERM GOAL #1   Title Pt will be independent with HEP for improved strength, balance, transfers, and gait. 04/24/2020    Time 8    Period Weeks    Status New      PT LONG TERM GOAL #2   Title Pt will perform 8 of 10 reps of sit<>stand from <18" surface with minimal to no UE support and no LOB, for improved functional strength and transfer efficiency.    Time 8    Period Weeks    Status New      PT LONG TERM GOAL #3   Title Pt will improve TUG cognitive score to less than or equal to 15 seconds for decreased fall risk.    Baseline 20.28 sec    Time 8    Period Weeks    Status New      PT LONG TERM GOAL #4   Title Pt will improve MiniBESTest score to at least 20/28 for decreased fall risk.    Baseline 14/28 at eval    Time 8    Period Weeks    Status New      PT LONG TERM GOAL #5   Title Pt will ambulate at least 1000 ft, indoors/outdoor surfaces independently (versus appropriate assistive device) for improved community gait.    Time 8    Period Weeks    Status New      Additional Long  Term Goals   Additional Long Term Goals Yes      PT LONG TERM GOAL #6   Title Pt will verbalize understanding of local Parkinson's disease resources, including options for continue community fitness.    Time 8    Period Weeks    Status New                 Plan - 03/30/20 1607    Clinical Impression Statement Pt met STG 1 and 3.  STG 2 unmet due to pt pushing back with L leg under chair and not coming up to full upright standing.  Need to check 2 remaining STG's.  Cont per poc.    Personal Factors and Comorbidities Comorbidity 3+;Time since onset of  injury/illness/exacerbation;Other   Recent addition of Sinemet to medications   Comorbidities glaucoma, osteopenia, TB (as a child)    Examination-Activity Limitations Transfers;Locomotion Level;Stand    Examination-Participation Restrictions Church;Community Activity    Stability/Clinical Decision Making Evolving/Moderate complexity    Rehab Potential Good    PT Frequency 2x / week    PT Duration 8 weeks   plus eval   PT Treatment/Interventions ADLs/Self Care Home Management;Gait training;Functional mobility training;Stair training;Therapeutic activities;Therapeutic exercise;Balance training;Neuromuscular re-education;DME Instruction;Patient/family education    PT Next Visit Plan Check STG 4 and 5. Review standing PWR moves (may need to make sure she is working at high enough intensity); gait training with focus on posture, step length, and arm swing (try again cues for taking less # of steps)    Consulted and Agree with Plan of Care Patient           Patient will benefit from skilled therapeutic intervention in order to improve the following deficits and impairments:  Abnormal gait, Difficulty walking, Impaired tone, Decreased balance, Impaired flexibility, Decreased mobility, Decreased strength, Postural dysfunction  Visit Diagnosis: Other abnormalities of gait and mobility  Unsteadiness on feet  Abnormal posture     Problem List Patient Active Problem List   Diagnosis Date Noted  . Parkinson's disease ([facility]) 02/17/2020  . Gait abnormality 12/04/2019  . Prediabetes 12/03/2018  . Vitamin D deficiency 12/03/2018  . Anemia due to vitamin B12 deficiency 12/03/2018  . Fatigue 12/03/2018  . Urinary frequency 10/01/2018  . Vitamin B12 deficiency 10/01/2018  . 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 Osteopenia 08/23/2012     Terry , PTA [facility] 03/30/20 4:10 PM Phone: [phone] Fax: [phone]   [facility] [address] [place], [place], [address] Phone: [phone]   Fax:  [phone]  Name: Denise [name] MRN: [id number] Date of Birth: 05/13/1951   

## 2020-04-02 ENCOUNTER — Encounter: Payer: Self-pay | Admitting: Occupational Therapy

## 2020-04-02 ENCOUNTER — Other Ambulatory Visit: Payer: Self-pay

## 2020-04-02 ENCOUNTER — Ambulatory Visit: Payer: 59 | Admitting: Physical Therapy

## 2020-04-02 ENCOUNTER — Ambulatory Visit: Payer: 59 | Admitting: Occupational Therapy

## 2020-04-02 DIAGNOSIS — R2689 Other abnormalities of gait and mobility: Secondary | ICD-10-CM

## 2020-04-02 DIAGNOSIS — R278 Other lack of coordination: Secondary | ICD-10-CM

## 2020-04-02 DIAGNOSIS — M6281 Muscle weakness (generalized): Secondary | ICD-10-CM

## 2020-04-02 DIAGNOSIS — R2681 Unsteadiness on feet: Secondary | ICD-10-CM

## 2020-04-02 DIAGNOSIS — R29818 Other symptoms and signs involving the nervous system: Secondary | ICD-10-CM

## 2020-04-02 DIAGNOSIS — R293 Abnormal posture: Secondary | ICD-10-CM

## 2020-04-02 NOTE — Patient Instructions (Signed)

## 2020-04-02 NOTE — Therapy (Signed)
[facility] [address] [place], [place], [address] Phone: [phone]   Fax:  [phone]  Occupational Therapy Treatment  Patient Details  Name: Denise [name] MRN: [id number] Date of Birth: Oct 30, 1950 Referring Provider (OT): Dr. Jaynee Eagles   Encounter Date: 04/02/2020   OT End of Session - 04/02/20 1455    Visit Number 2    Number of Visits 9    Date for OT Re-Evaluation 05/08/20    Authorization Type [employer]    OT Start Time 0240    OT Stop Time 1530    OT Time Calculation (min) 43 min           Past Medical History:  Diagnosis Date  . Chronic kidney disease    as a child, no present issues   . Glaucoma   . Osteopenia 06/2018   T score -1.9 FRAX 3% / 0.2%  . 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 TUBAL LIGATION      There were no vitals filed for this visit.   Subjective Assessment - 04/02/20 1520    Subjective  Pt reports she had hip pain this morning but it has since subsided and she denies pain currently.    Patient Stated Goals improve handwriting,    Currently in Pain? No/denies               Treatment:  Coordination HEP see pt instructions  Handwriting Changes and practices with Parkinson's. Issued red foam grip  Small Pegboard design and RUE coordination - no drops and required increased time.        OT Education - 04/02/20 1513    Education Details Provided Red foam grip for pen. Issued Coordination HEP    Person(s) Educated Patient    Methods Explanation;Demonstration    Comprehension Verbalized understanding;Returned demonstration               OT Long Term Goals - 03/13/20 1346      OT LONG TERM GOAL #1   Title I with PD specific HEP    Time 4    Period Weeks    Status New    Target Date 05/08/20   delayed start in tx     OT LONG TERM GOAL #2   Title Pt will verbalize understanding of adapted  strategies to maximize safety and I with ADLs/ IADLs .    Time 4    Period Weeks    Status New      OT LONG TERM GOAL #3   Title Pt will verbalize understanding of ways to prevent future PD related complications and PD community resources.    Time 4    Period Weeks    Status New      OT LONG TERM GOAL #4   Title Pt will write a short paragraph with 100% legibility and no significant decrease in letter size    Time 4    Period Weeks    Status New      OT LONG TERM GOAL #5   Title Pt will demonstrate increased ease with dressing as eveidnced by decreasing PPT#4(don/ doff jacket) to 15 secs or less    Baseline 18.19 secs    Time 4    Period Weeks    Status New      Long Term Additional Goals   Additional Long Term Goals Yes      OT LONG  TERM GOAL #6   Title Pt will demonstrate ability to retrieve a lightweight object from overhead shelf with  -10 elbow extension with RUE    Baseline sh flex140, elbow ext -15    Time 4    Period Weeks    Status New                 Plan - 04/02/20 1457    Clinical Impression Statement Patient is working towards goals. Started with HEP coordination today and working on handwriting. Continues to benefit from skilled OT for addressing needs.    OT Occupational Profile and History Problem Focused Assessment - Including review of records relating to presenting problem    Occupational performance deficits (Please refer to evaluation for details): ADL's;IADL's;Work;Leisure;Social Participation    Body Structure / Function / Physical Skills ADL;Balance;Mobility;Strength;Flexibility;UE functional use;FMC;Gait;Coordination;Decreased knowledge of precautions;GMC;ROM;Decreased knowledge of use of DME;Dexterity;IADL    Rehab Potential Good    Clinical Decision Making Limited treatment options, no task modification necessary    Comorbidities Affecting Occupational Performance: May have comorbidities impacting occupational performance    Modification  or Assistance to Complete Evaluation  No modification of tasks or assist necessary to complete eval    OT Frequency 2x / week    OT Duration 4 weeks    OT Treatment/Interventions Self-care/ADL training;Therapeutic exercise;Balance training;Functional Mobility Training;Manual Therapy;Neuromuscular education;Therapeutic activities;DME and/or AE instruction;Patient/family education;Passive range of motion;Moist Heat;Cognitive remediation/compensation;Energy conservation    Plan PWR! moves, handwriting strategies, coordination RUE    Consulted and Agree with Plan of Care Patient           Patient will benefit from skilled therapeutic intervention in order to improve the following deficits and impairments:   Body Structure / Function / Physical Skills: ADL, Balance, Mobility, Strength, Flexibility, UE functional use, FMC, Gait, Coordination, Decreased knowledge of precautions, GMC, ROM, Decreased knowledge of use of DME, Dexterity, IADL       Visit Diagnosis: Muscle weakness (generalized)  Other lack of coordination  Other abnormalities of gait and mobility  Abnormal posture    Problem List Patient Active Problem List   Diagnosis Date Noted  . Parkinson's disease ([place]) 02/17/2020  . Gait abnormality 12/04/2019  . Prediabetes 12/03/2018  . Vitamin D deficiency 12/03/2018  . Anemia due to vitamin B12 deficiency 12/03/2018  . Fatigue 12/03/2018  . Urinary frequency 10/01/2018  . Vitamin B12 deficiency 10/01/2018  . 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 Osteopenia 08/23/2012    Zachery Conch MOT, OTR/L  04/02/2020, 3:32 PM  [facility] [address] [place], [place], [address] Phone: [phone]   Fax:  [phone]  Name: Denise [name] MRN: [id number] Date of Birth: Sep 27, 1950

## 2020-04-02 NOTE — Therapy (Signed)
[facility] [address] [place], [place], [address] Phone: [phone]   Fax:  [phone]  Physical Therapy Treatment  Patient Details  Name: Denise [name] MRN: [id number] Date of Birth: [birth year] Referring Provider (PT): Dr. Jaynee Eagles   Encounter Date: 04/02/2020   PT End of Session - 04/02/20 1449    Visit Number 8    Number of Visits 17    Date for PT Re-Evaluation 68/03/21   90 day cert for 8-wk [facility]; VL 30    Authorization - Visit Number 8    Authorization - Number of Visits 30    PT Start Time 2248    PT Stop Time 1358    PT Time Calculation (min) 42 min    Activity Tolerance Patient tolerated treatment well    Behavior During Therapy [facility] for tasks assessed/performed           Past Medical History:  Diagnosis Date  . Chronic kidney disease    as a child, no present issues   . Glaucoma   . Osteopenia 06/2018   T score -1.9 FRAX 3% / 0.2%  . 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 TUBAL LIGATION      There were no vitals filed for this visit.   Subjective Assessment - 04/02/20 1319    Subjective reports the exercises are going well. had one episode yesterday when she felt like she got stuck. notices that she does not feel as stiff when she does the exercises.    Pertinent History glaucoma, osteopenia, TB (as a child)    Patient Stated Goals Pt's goals for therapy are to get stronger in legs and hands.    Currently in Pain? No/denies    Pain Onset More than a month ago                             [facility] Adult PT Treatment/Exercise - 04/02/20 1332      High Level Balance   High Level Balance Comments posterior push and release: 3 small steps x3 reps, anterior push and release: 2 smaller shuffled steps x2 reps - educated on purpose of stepping strategy for balance      Therapeutic Activites     Therapeutic Activities Other Therapeutic Activities    Other Therapeutic Activities provided handout on ways to prevent freezing episodes, reviewed each with pt and pt verbalized understanding using teach back method, able to demo understanding              Pt performs PWR! Moves in standing position:    PWR! Up for improved posture x10 reps   PWR! Rock for improved weighshifting x10 reps B  PWR! Twist for improved trunk rotation x5 reps B  PWR! Step for improved step initiation x5 reps B - verbal cues for incr foot clearance as pt with tendency to scuff feet on the floor, x5 reps B with use of 2" foam beams for incr foot clearance, then performed x5 reps B over 4" beams for incr foot clearance - one time pt caught L foot on beam but able to recorrect balance   Cues provided for intensity and "big" hands throughout and cues to count out loud with therapist.    NMR: -alternating forward stepping over 2" black foam beam for incr foot clearance x10 reps B and  then progressed to x10 reps B stepping over 4" beam -alternating posterior stepping strategy x10 reps B, cues for incr foot clearance, posture and step length     Balance Exercises - 04/02/20 0001      Balance Exercises: Standing   Stepping Strategy Anterior;Posterior;Foam/compliant surface;10 reps;Limitations    Stepping Strategy Limitations on blue foam beam, x10 reps B each direction, cues for sequencing and incr foot clearance    Other Standing Exercises Comments forward stepping and then backwards stepping (skipping midline) x10 reps B -cues for posture             PT Education - 04/02/20 1327    Education Details handout to decr freezing episodes, progress towards goals    Person(s) Educated Patient    Methods Explanation;Demonstration;Handout    Comprehension Verbalized understanding;Returned demonstration            PT Short Term Goals - 04/02/20 1327      PT SHORT TERM GOAL #1   Title Pt will be  independent with HEP for improved strength, balance, transfers, and gait.  TARGET 03/27/2020 (may be delayed due to scheduling)    Time 4    Period Weeks    Status Achieved      PT SHORT TERM GOAL #2   Title Pt will improve 5x sit<>stand to less than or equal to 12.5 sec to demonstrate improved functional strength and transfer efficiency.    Baseline 13.40 seconds 03/30/20    Time 4    Period Weeks    Status Not Met      PT SHORT TERM GOAL #3   Title Pt will improve TUG score to less than or equal to 13.5 seconds for decreased fall risk.    Baseline 9.47 seconds    Time 4    Period Weeks    Status Achieved      PT SHORT TERM GOAL #4   Title Pt will recover posterior balance in push and release test in 2 or less steps independently, for improved balance recovery.    Baseline posterior push and release: 3 small steps    Time 4    Period Weeks    Status Not Met      PT SHORT TERM GOAL #5   Title Pt will verbalize understanding of tips to reduce freezing with gait, steps, and turns.    Baseline educated on 04/02/20 and provided handout, pt able to verbalize understanding using teach back method    Time 4    Period Weeks    Status Achieved             PT Long Term Goals - 02/28/20 1437      PT LONG TERM GOAL #1   Title Pt will be independent with HEP for improved strength, balance, transfers, and gait. 04/24/2020    Time 8    Period Weeks    Status New      PT LONG TERM GOAL #2   Title Pt will perform 8 of 10 reps of sit<>stand from <18" surface with minimal to no UE support and no LOB, for improved functional strength and transfer efficiency.    Time 8    Period Weeks    Status New      PT LONG TERM GOAL #3   Title Pt will improve TUG cognitive score to less than or equal to 15 seconds for decreased fall risk.    Baseline 20.28 sec    Time 8  Period Weeks    Status New      PT LONG TERM GOAL #4   Title Pt will improve MiniBESTest score to at least 20/28 for  decreased fall risk.    Baseline 14/28 at eval    Time 8    Period Weeks    Status New      PT LONG TERM GOAL #5   Title Pt will ambulate at least 1000 ft, indoors/outdoor surfaces independently (versus appropriate assistive device) for improved community gait.    Time 8    Period Weeks    Status New      Additional Long Term Goals   Additional Long Term Goals Yes      PT LONG TERM GOAL #6   Title Pt will verbalize understanding of local Parkinson's disease resources, including options for continue community fitness.    Time 8    Period Weeks    Status New                 Plan - 04/02/20 1452    Clinical Impression Statement Checked remaining STGs today. Provided pt with handout for tips to overcome freezing episodes, pt able to verbalize and demo understanding. Did not meet LTG #4 with posterior push and release test - had an improvement from eval, but took 3 small steps in order to regain balance. Remainder of session focuesd on PWR standing moves and balance strategies for stepping and incr foot clearance. Will continue to progress towards LTGs.    Personal Factors and Comorbidities Comorbidity 3+;Time since onset of injury/illness/exacerbation;Other   Recent addition of Sinemet to medications   Comorbidities glaucoma, osteopenia, TB (as a child)    Examination-Activity Limitations Transfers;Locomotion Level;Stand    Examination-Participation Restrictions Church;Community Activity    Stability/Clinical Decision Making Evolving/Moderate complexity    Rehab Potential Good    PT Frequency 2x / week    PT Duration 8 weeks   plus eval   PT Treatment/Interventions ADLs/Self Care Home Management;Gait training;Functional mobility training;Stair training;Therapeutic activities;Therapeutic exercise;Balance training;Neuromuscular re-education;DME Instruction;Patient/family education    PT Next Visit Plan stepping strategies. try standing PWR flow. gait training with focus on posture,  step length, and arm swing (try again cues for taking less # of steps). postural exercises    Consulted and Agree with Plan of Care Patient           Patient will benefit from skilled therapeutic intervention in order to improve the following deficits and impairments:  Abnormal gait, Difficulty walking, Impaired tone, Decreased balance, Impaired flexibility, Decreased mobility, Decreased strength, Postural dysfunction  Visit Diagnosis: Other abnormalities of gait and mobility  Unsteadiness on feet  Abnormal posture  Other symptoms and signs involving the nervous system     Problem List Patient Active Problem List   Diagnosis Date Noted  . Parkinson's disease ([place]) 02/17/2020  . Gait abnormality 12/04/2019  . Prediabetes 12/03/2018  . Vitamin D deficiency 12/03/2018  . Anemia due to vitamin B12 deficiency 12/03/2018  . Fatigue 12/03/2018  . Urinary frequency 10/01/2018  . Vitamin B12 deficiency 10/01/2018  . 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 Osteopenia 08/23/2012    Arliss Journey, PT, DPT  04/02/2020, 2:54 PM  [facility] [address] [place], [place], [address] Phone: [phone]   Fax:  [phone]  Name: Yameli Delamater MRN: [id number] Date of Birth: 13-Dec-1950

## 2020-04-02 NOTE — Patient Instructions (Signed)
  Coordination Activities  Perform the following activities for 20 minutes 1-2 times per day with right hand(s).   Rotate ball in fingertips (clockwise and counter-clockwise).  Toss ball between hands.  Toss ball in air and catch with the same hand.  Flip cards 1 at a time as fast as you can.  Deal cards with your thumb (Hold deck in hand and push card off top with thumb).  Shuffle cards.  Pick up coins, buttons, marbles, dried beans/pasta of different sizes and place in container.  Pick up coins and place in container or coin bank.  Pick up coins and stack.  Pick up coins one at a time until you get 5-10 in your hand, then move coins from palm to fingertips to stack one at a time.  Practice writing and/or typing.

## 2020-04-06 ENCOUNTER — Other Ambulatory Visit: Payer: Self-pay

## 2020-04-06 ENCOUNTER — Ambulatory Visit: Payer: 59 | Attending: Neurology | Admitting: Physical Therapy

## 2020-04-06 DIAGNOSIS — R278 Other lack of coordination: Secondary | ICD-10-CM | POA: Insufficient documentation

## 2020-04-06 DIAGNOSIS — M6281 Muscle weakness (generalized): Secondary | ICD-10-CM | POA: Diagnosis present

## 2020-04-06 DIAGNOSIS — R2681 Unsteadiness on feet: Secondary | ICD-10-CM | POA: Diagnosis present

## 2020-04-06 DIAGNOSIS — R2689 Other abnormalities of gait and mobility: Secondary | ICD-10-CM | POA: Insufficient documentation

## 2020-04-06 DIAGNOSIS — R29818 Other symptoms and signs involving the nervous system: Secondary | ICD-10-CM | POA: Diagnosis present

## 2020-04-06 DIAGNOSIS — R293 Abnormal posture: Secondary | ICD-10-CM | POA: Insufficient documentation

## 2020-04-06 NOTE — Therapy (Signed)
[facility] [address] [place], [place], [address] Phone: [phone]   Fax:  [phone]  Physical Therapy Treatment  Patient Details  Name: Denise [name] MRN: [id number] Date of Birth: 02-24-1951 Referring Provider (PT): Dr. Jaynee Eagles   Encounter Date: 04/06/2020   PT End of Session - 04/06/20 1314    Visit Number 9    Number of Visits 17    Date for PT Re-Evaluation 71/06/26   90 day cert for 8-wk [place]; VL 30    Authorization - Visit Number 9    Authorization - Number of Visits 30    PT Start Time 1230    PT Stop Time 1310    PT Time Calculation (min) 40 min    Activity Tolerance Patient tolerated treatment well    Behavior During Therapy [facility] for tasks assessed/performed           Past Medical History:  Diagnosis Date  . Chronic kidney disease    as a child, no present issues   . Glaucoma   . Osteopenia 06/2018   T score -1.9 FRAX 3% / 0.2%  . 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 TUBAL LIGATION      There were no vitals filed for this visit.   Subjective Assessment - 04/06/20 1233    Subjective No changes, no pain since last visit.  No falls.    Pertinent History glaucoma, osteopenia, TB (as a child)    Patient Stated Goals Pt's goals for therapy are to get stronger in legs and hands.    Currently in Pain? No/denies    Pain Onset More than a month ago                             [facility] Adult PT Treatment/Exercise - 04/06/20 0001      Ambulation/Gait   Ambulation/Gait Yes    Ambulation/Gait Assistance 5: Supervision    Ambulation/Gait Assistance Details Used bilateral walking poles to facilitate arm swing x 2 laps, then 1 lap poles removed.  1st lap, pt takes small steps, able to increase stride length 2nd and 3rd laps    Ambulation Distance (Feet) 345 Feet   480 ft-cues for counting strides    Assistive device None   bilateral walking poles   Gait Pattern Step-through pattern;Decreased arm swing - left;Decreased step length - right;Decreased step length - left;Right foot flat;Left foot flat;Decreased trunk rotation;Trunk flexed;Narrow base of support;Poor foot clearance - left;Poor foot clearance - right    Ambulation Surface Level;Indoor    Gait Comments Gait along hallway, 30-40 ft, turning at end of hallway.  For distance of 20 ft, PT counts steps (without cues)-pt takes 13 steps; with VCs to take longer strides, pt takes 10-11 steps in same distance.               Balance Exercises - 04/06/20 0001      Balance Exercises: Standing   Stepping Strategy Anterior;Posterior;Foam/compliant surface;10 reps;Limitations    Stepping Strategy Limitations on Airex x10 reps Bilat each direction, cues for sequencing, weightshift, and incr foot clearance    Rockerboard Anterior/posterior;EO;10 reps;Limitations    Rockerboard Limitations Ankle, hip strategy x 10 reps each, then forward step off board/return to midline x 10, back step off board, return to midline x 10 reps with UE support.  Other Standing Exercises Standing on Airex, forward>back step and weightshift x 10 reps with cues for foot clearance, step length.    Other Standing Exercises Comments On solid surface posterior step and weigthshift for step strategy, x 10 reps each leg.  Then progressed to alternating legs with light perturbations x 10 reps each.  Cues for widened BOS upon return to midline for more stable balance.             PT Education - 04/06/20 1309    Education Details Counting steps (for taking longer/less steps) in the hallway in the house    Person(s) Educated Patient    Methods Explanation;Demonstration;Verbal cues    Comprehension Verbalized understanding;Returned demonstration            PT Short Term Goals - 04/02/20 1327      PT SHORT TERM GOAL #1   Title Pt will be independent with HEP for  improved strength, balance, transfers, and gait.  TARGET 03/27/2020 (may be delayed due to scheduling)    Time 4    Period Weeks    Status Achieved      PT SHORT TERM GOAL #2   Title Pt will improve 5x sit<>stand to less than or equal to 12.5 sec to demonstrate improved functional strength and transfer efficiency.    Baseline 13.40 seconds 03/30/20    Time 4    Period Weeks    Status Not Met      PT SHORT TERM GOAL #3   Title Pt will improve TUG score to less than or equal to 13.5 seconds for decreased fall risk.    Baseline 9.47 seconds    Time 4    Period Weeks    Status Achieved      PT SHORT TERM GOAL #4   Title Pt will recover posterior balance in push and release test in 2 or less steps independently, for improved balance recovery.    Baseline posterior push and release: 3 small steps    Time 4    Period Weeks    Status Not Met      PT SHORT TERM GOAL #5   Title Pt will verbalize understanding of tips to reduce freezing with gait, steps, and turns.    Baseline educated on 04/02/20 and provided handout, pt able to verbalize understanding using teach back method    Time 4    Period Weeks    Status Achieved             PT Long Term Goals - 02/28/20 1437      PT LONG TERM GOAL #1   Title Pt will be independent with HEP for improved strength, balance, transfers, and gait. 04/24/2020    Time 8    Period Weeks    Status New      PT LONG TERM GOAL #2   Title Pt will perform 8 of 10 reps of sit<>stand from <18" surface with minimal to no UE support and no LOB, for improved functional strength and transfer efficiency.    Time 8    Period Weeks    Status New      PT LONG TERM GOAL #3   Title Pt will improve TUG cognitive score to less than or equal to 15 seconds for decreased fall risk.    Baseline 20.28 sec    Time 8    Period Weeks    Status New      PT LONG TERM GOAL #4  Title Pt will improve MiniBESTest score to at least 20/28 for decreased fall risk.     Baseline 14/28 at eval    Time 8    Period Weeks    Status New      PT LONG TERM GOAL #5   Title Pt will ambulate at least 1000 ft, indoors/outdoor surfaces independently (versus appropriate assistive device) for improved community gait.    Time 8    Period Weeks    Status New      Additional Long Term Goals   Additional Long Term Goals Yes      PT LONG TERM GOAL #6   Title Pt will verbalize understanding of local Parkinson's disease resources, including options for continue community fitness.    Time 8    Period Weeks    Status New                 Plan - 04/06/20 1314    Clinical Impression Statement Treatment session focused heavily on step and weigthshifting for improved step strategies in posterior (and anterior) direction.  Pt with UE support throughout, with added unpredictability towards end of session.  Also, based on pt's request, worked on gait with increased step lengtha nd reciprocal arm swing.  Pt will continue to benefit from skilled PT to further address balance and gait for improved mobility and decreased falls.    Personal Factors and Comorbidities Comorbidity 3+;Time since onset of injury/illness/exacerbation;Other   Recent addition of Sinemet to medications   Comorbidities glaucoma, osteopenia, TB (as a child)    Examination-Activity Limitations Transfers;Locomotion Level;Stand    Examination-Participation Restrictions Church;Community Activity    Stability/Clinical Decision Making Evolving/Moderate complexity    Rehab Potential Good    PT Frequency 2x / week    PT Duration 8 weeks   plus eval   PT Treatment/Interventions ADLs/Self Care Home Management;Gait training;Functional mobility training;Stair training;Therapeutic activities;Therapeutic exercise;Balance training;Neuromuscular re-education;DME Instruction;Patient/family education    PT Next Visit Plan 10th visit progress note; stepping strategies (especially posterior-add to HEP). try standing PWR  flow. gait training with focus on posture, step length, and arm swing (try again cues for taking less # of steps). postural exercises    Consulted and Agree with Plan of Care Patient           Patient will benefit from skilled therapeutic intervention in order to improve the following deficits and impairments:  Abnormal gait, Difficulty walking, Impaired tone, Decreased balance, Impaired flexibility, Decreased mobility, Decreased strength, Postural dysfunction  Visit Diagnosis: Other abnormalities of gait and mobility  Unsteadiness on feet  Other symptoms and signs involving the nervous system     Problem List Patient Active Problem List   Diagnosis Date Noted  . Parkinson's disease ([place]) 02/17/2020  . Gait abnormality 12/04/2019  . Prediabetes 12/03/2018  . Vitamin D deficiency 12/03/2018  . Anemia due to vitamin B12 deficiency 12/03/2018  . Fatigue 12/03/2018  . Urinary frequency 10/01/2018  . Vitamin B12 deficiency 10/01/2018  . 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 Osteopenia 08/23/2012    Demani Mcbrien W. 04/06/2020, 9:57 PM  Frazier Butt., PT   [facility] [address] [place], [place], [address] Phone: [phone]   Fax:  [phone]  Name: Verdene Creson MRN: [id number] Date of Birth: Aug 30, 1950

## 2020-04-07 ENCOUNTER — Ambulatory Visit (INDEPENDENT_AMBULATORY_CARE_PROVIDER_SITE_OTHER): Payer: 59 | Admitting: Nurse Practitioner

## 2020-04-07 ENCOUNTER — Telehealth: Payer: Self-pay | Admitting: *Deleted

## 2020-04-07 ENCOUNTER — Encounter: Payer: Self-pay | Admitting: Nurse Practitioner

## 2020-04-07 VITALS — BP 116/80 | HR 63 | Temp 98.0°F | Ht 66.0 in | Wt 128.2 lb

## 2020-04-07 DIAGNOSIS — I2510 Atherosclerotic heart disease of native coronary artery without angina pectoris: Secondary | ICD-10-CM

## 2020-04-07 DIAGNOSIS — D519 Vitamin B12 deficiency anemia, unspecified: Secondary | ICD-10-CM | POA: Diagnosis not present

## 2020-04-07 DIAGNOSIS — Z23 Encounter for immunization: Secondary | ICD-10-CM | POA: Diagnosis not present

## 2020-04-07 MED ORDER — ATORVASTATIN CALCIUM 10 MG PO TABS: ORAL_TABLET | ORAL | 1 refills | Status: DC

## 2020-04-07 MED ORDER — PNEUMOCOCCAL 13-VAL CONJ VACC IM SUSP: 0.5000 mL | INTRAMUSCULAR | 0 refills | Status: AC

## 2020-04-07 NOTE — Patient Instructions (Signed)
Influenza Virus Vaccine (Flucelvax) What is this medicine? INFLUENZA VIRUS VACCINE (in floo EN zuh VAHY ruhs vak SEEN) helps to reduce the risk of getting influenza also known as the flu. The vaccine only helps protect you against some strains of the flu. This medicine may be used for other purposes; ask your health care provider or pharmacist if you have questions. COMMON BRAND NAME(S): FLUCELVAX What should I tell my health care provider before I take this medicine? They need to know if you have any of these conditions:  bleeding disorder like hemophilia  fever or infection  Guillain-Barre syndrome or other neurological problems  immune system problems  infection with the human immunodeficiency virus (HIV) or AIDS  low blood platelet counts  multiple sclerosis  an unusual or allergic reaction to influenza virus vaccine, other medicines, foods, dyes or preservatives  pregnant or trying to get pregnant  breast-feeding How should I use this medicine? This vaccine is for injection into a muscle. It is given by a health care professional. A copy of Vaccine Information Statements will be given before each vaccination. Read this sheet carefully each time. The sheet may change frequently. Talk to your pediatrician regarding the use of this medicine in children. Special care may be needed. Overdosage: If you think you've taken too much of this medicine contact a poison control center or emergency room at once. Overdosage: If you think you have taken too much of this medicine contact a poison control center or emergency room at once. NOTE: This medicine is only for you. Do not share this medicine with others. What if I miss a dose? This does not apply. What may interact with this medicine?  chemotherapy or radiation therapy  medicines that lower your immune system like etanercept, anakinra, infliximab, and adalimumab  medicines that treat or prevent blood clots like  warfarin  phenytoin  steroid medicines like prednisone or cortisone  theophylline  vaccines This list may not describe all possible interactions. Give your health care provider a list of all the medicines, herbs, non-prescription drugs, or dietary supplements you use. Also tell them if you smoke, drink alcohol, or use illegal drugs. Some items may interact with your medicine. What should I watch for while using this medicine? Report any side effects that do not go away within 3 days to your doctor or health care professional. Call your health care provider if any unusual symptoms occur within 6 weeks of receiving this vaccine. You may still catch the flu, but the illness is not usually as bad. You cannot get the flu from the vaccine. The vaccine will not protect against colds or other illnesses that may cause fever. The vaccine is needed every year. What side effects may I notice from receiving this medicine? Side effects that you should report to your doctor or health care professional as soon as possible:  allergic reactions like skin rash, itching or hives, swelling of the face, lips, or tongue Side effects that usually do not require medical attention (Report these to your doctor or health care professional if they continue or are bothersome.):  fever  headache  muscle aches and pains  pain, tenderness, redness, or swelling at the injection site  tiredness This list may not describe all possible side effects. Call your doctor for medical advice about side effects. You may report side effects to FDA at 1-800-FDA-1088. Where should I keep my medicine? The vaccine will be given by a health care professional in a clinic, pharmacy, doctor's   office, or other health care setting. You will not be given vaccine doses to store at home. NOTE: This sheet is a summary. It may not cover all possible information. If you have questions about this medicine, talk to your doctor, pharmacist, or  health care provider.  2020 Elsevier/Gold Standard (2011-06-01 14:06:47)  

## 2020-04-07 NOTE — Progress Notes (Signed)
I,Yamilka Roman [employer] as a Education administrator for [employer], FNP.,have documented all relevant documentation on the behalf of Minette Brine, FNP,as directed by  Minette Brine, FNP while in the presence of Minette Brine, [place].  This visit occurred during the SARS-CoV-2 public health emergency.  Safety protocols were in place, including screening questions prior to the visit, additional usage of staff PPE, and extensive cleaning of exam room while observing appropriate contact time as indicated for disinfecting solutions.  Subjective:     Patient ID: Denise [name] , female    DOB: Jan 10, 1951 , 69 y.o.   MRN: [id number]   Chief Complaint  Patient presents with  . B12 Injection    HPI  Patient presents today for a vitamin b12 injection, she does not feel as tired as before. She does not feel she needs an injection today. Her last visit her Vitamin B12 was within normal range.   She is scheduled to see Dr. Jaynee Eagles for her parkinsonism, she is currently on carbidopa/levodopa and feels like she is doing well.   Her bilateral legs are looking better since her last visit and she has seen the Dermatologist.   She is here today with her daughter.     Past Medical History:  Diagnosis Date  . Chronic kidney disease    as a child, no present issues   . Glaucoma   . Osteopenia 06/2018   T score -1.9 FRAX 3% / 0.2%  . 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 Hypertension Mother   . Heart disease Mother   . Diabetes Father   . Hypertension Father   . Other Father        had a condition that was "a cousin" to ALS   . Diabetes Sister   . Tuberculosis Maternal Grandmother   . Heart disease Sister   . Colitis Daughter   . Colon cancer Neg Hx   . Colon polyps Neg Hx   . Esophageal cancer Neg Hx   . Rectal cancer Neg Hx   . Stomach cancer Neg Hx      Current Outpatient Medications:  .  Cholecalciferol (VITAMIN D3) 10 MCG (400 UNIT) CAPS, Take 1  capsule by mouth daily. Pt unaware of dose, Disp: , Rfl:  .  hydrochlorothiazide (HYDRODIURIL) 12.5 MG tablet, TAKE 1 TABLET BY MOUTH EVERY DAY, Disp: 90 tablet, Rfl: 1 .  timolol (BETIMOL) 0.25 % ophthalmic solution, 1-2 drops daily. , Disp: , Rfl:  .  Travoprost, BAK Free, (TRAVATAN) 0.004 % SOLN ophthalmic solution, 1 drop at bedtime., Disp: , Rfl:  .  atorvastatin (LIPITOR) 10 MG tablet, Take 1 tablet by mouth at bedtime Mon - Fri, Disp: 60 tablet, Rfl: 1 .  carbidopa-levodopa (SINEMET IR) 25-100 MG tablet, TAKE 1 TABLET BY MOUTH 3 TIMES DAILY. TAKE 4 HOURS APART SUCH AS 8AM, NOON AND 4PM. SEPARATE FROM PROTEIN. CAN EACH WITH CRACKERS OR BREAD OR JUICE., Disp: 12 tablet, Rfl: 0 .  triamcinolone ointment (KENALOG) 0.5 %, APPLY TO AFFECTED AREA TWICE A DAY (Patient not taking: Reported on 02/28/2020), Disp: 30 g, Rfl: 0   Allergies  Allergen Reactions  . Nystatin-Triamcinolone Swelling    BLISTERS  . Cephalexin Itching and Rash     Review of Systems  Constitutional: Negative.   Respiratory: Negative.   Cardiovascular: Negative.  Negative for chest pain, palpitations and leg swelling.  Neurological: Negative for dizziness and headaches.  Psychiatric/Behavioral: Negative.  Today's Vitals   04/07/20 1525  BP: 116/80  Pulse: 63  Temp: 98 F (36.7 C)  TempSrc: Oral  Weight: 128 lb 3.2 oz (58.2 kg)  Height: 5\' 6"  (1.676 m)  PainSc: 0-No pain   Body mass index is 20.69 kg/m.   Objective:  Physical Exam Vitals reviewed.  Constitutional:      General: She is not in acute distress.    Appearance: Normal appearance.  Cardiovascular:     Rate and Rhythm: Normal rate and regular rhythm.     Pulses: Normal pulses.     Heart sounds: Normal heart sounds. No murmur heard.   Pulmonary:     Effort: Pulmonary effort is normal. No respiratory distress.     Breath sounds: Normal breath sounds. No wheezing.  Skin:    General: Skin is warm and dry.  Neurological:     General: No  focal deficit present.     Mental Status: She is alert and oriented to person, place, and time.     Cranial Nerves: No cranial nerve deficit.  Psychiatric:        Mood and Affect: Mood normal.        Behavior: Behavior normal.        Thought Content: Thought content normal.        Judgment: Judgment normal.         Assessment And Plan:     1. Anemia due to vitamin B12 deficiency, unspecified B12 deficiency type  Chronic, she is not receiving a Vitamin B12 injection today she feels okay.    2. Atherosclerotic cardiovascular disease  She is to take atorvastatin M-F  She is encouraged to avoid fried and fatty foods.  - atorvastatin (LIPITOR) 10 MG tablet; Take 1 tablet by mouth at bedtime Mon - Fri  Dispense: 60 tablet; Refill: 1  3. Need for influenza vaccination  Influenza vaccine administered  Encouraged to take Tylenol as needed for fever or muscle aches. - Flu Vaccine QUAD High Dose(Fluad)  4. Encounter for immunization - pneumococcal 13-valent conjugate vaccine (PREVNAR 13) SUSP injection; Inject 0.5 mLs into the muscle tomorrow at 10 am for 1 dose.  Dispense: 0.5 mL; Refill: 0    Patient was given opportunity to ask questions. Patient verbalized understanding of the plan and was able to repeat key elements of the plan. All questions were answered to their satisfaction.   Teola Bradley, FNP, have reviewed all documentation for this visit. The documentation on 04/26/20 for the exam, diagnosis, procedures, and orders are all accurate and complete.   THE PATIENT IS ENCOURAGED TO PRACTICE SOCIAL DISTANCING DUE TO THE COVID-19 PANDEMIC.

## 2020-04-07 NOTE — Telephone Encounter (Signed)
Not able to reach pt left voice mail.

## 2020-04-08 ENCOUNTER — Ambulatory Visit: Payer: 59 | Admitting: Nurse Practitioner

## 2020-04-09 ENCOUNTER — Other Ambulatory Visit: Payer: Self-pay

## 2020-04-09 ENCOUNTER — Ambulatory Visit: Payer: 59 | Admitting: Neurology

## 2020-04-09 ENCOUNTER — Ambulatory Visit: Payer: 59 | Admitting: Physical Therapy

## 2020-04-09 DIAGNOSIS — R2689 Other abnormalities of gait and mobility: Secondary | ICD-10-CM | POA: Diagnosis not present

## 2020-04-09 DIAGNOSIS — R29818 Other symptoms and signs involving the nervous system: Secondary | ICD-10-CM

## 2020-04-09 DIAGNOSIS — R2681 Unsteadiness on feet: Secondary | ICD-10-CM

## 2020-04-09 NOTE — Patient Instructions (Signed)
Access Code: [email or web address] URL: [email or web address] Date: 04/09/2020 Prepared by: Mady Haagensen  Exercises Alternating Step Backward with Support - 1 x daily - 5 x weekly - 1-2 sets - 10 reps

## 2020-04-09 NOTE — Therapy (Signed)
[facility] [address] [place], [place], [address] Phone: [phone]   Fax:  [phone]  Physical Therapy Treatment/10th Visit Progress Note  Patient Details  Name: Denise [name] MRN: [id number] Date of Birth: 02/11/1951 Referring Provider (PT): Dr. Jaynee Eagles   Encounter Date: 04/09/2020   PT End of Session - 04/09/20 1725    Visit Number 10    Number of Visits 17    Date for PT Re-Evaluation 63/84/53   90 day cert for 8-wk [place]; VL 30    Authorization - Visit Number 10    Authorization - Number of Visits 30    PT Start Time 6468    PT Stop Time 1446    PT Time Calculation (min) 40 min    Activity Tolerance Patient tolerated treatment well    Behavior During Therapy [facility] for tasks assessed/performed           Past Medical History:  Diagnosis Date  . Chronic kidney disease    as a child, no present issues   . Glaucoma   . Osteopenia 06/2018   T score -1.9 FRAX 3% / 0.2%  . 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 TUBAL LIGATION      There were no vitals filed for this visit.   Subjective Assessment - 04/09/20 1406    Subjective Working on focusing on counting steps for longer strides.    Pertinent History glaucoma, osteopenia, TB (as a child)    Patient Stated Goals Pt's goals for therapy are to get stronger in legs and hands.    Currently in Pain? No/denies    Pain Onset --                             [facility] Adult PT Treatment/Exercise - 04/09/20 0001      Ambulation/Gait   Ambulation/Gait Yes    Ambulation/Gait Assistance 5: Supervision    Ambulation Distance (Feet) 400 Feet   x 2   Assistive device None    Gait Pattern Step-through pattern;Decreased arm swing - left;Decreased step length - right;Decreased step length - left;Right foot flat;Left foot flat;Decreased trunk rotation;Trunk flexed;Narrow  base of support;Poor foot clearance - left;Poor foot clearance - right    Ambulation Surface Level;Indoor    Gait Comments  For distance of 20 ft in hallway, PT counts steps 9-10 in distance (last visit 10-13 steps).           Neuro Re-education:  Standing PWR! Moves Flow: PWR! Up>PWR! Rock>PWR! Twist>PWR! Step, x 5 reps with cues for technique, intensity  Multidirectional stepping activities, using visual/verbal prompts, then progressing to pt calling out stepping direction and performing.  Cues to maintain posture and intensity of stepping.  Explained stepping activities to help prevent LOB, including trying to make intense, robust step with return to midline, working on step strategy for balance recovery.     Balance Exercises - 04/09/20 0001      Balance Exercises: Standing   Stepping Strategy Anterior;Posterior;Lateral;UE support;Foam/compliant surface;Limitations;10 reps   Solid surface x 10, foam surface x 10   Stepping Strategy Limitations on Airex x10 reps, alternating legs each direction, cues for sequencing, weightshift, and incr foot clearance    Other Standing Exercises Standing on Airex, forward>back step and weightshift x 10 reps with cues for foot clearance, step length.  Other Standing Exercises Comments On solid surface, PT provides light perturbations x 10 reps for patient to respond with posterior step and weigthshift, light UE support at counter.  Cues for widened BOS upon return to midline and improved foot clearance for more stable balance.             PT Education - 04/09/20 1725    Education Details Provided handout for posterior step and weigthshift; verbally discussed adding PWR! Moves Flow in standing once a week to HEP routine    Person(s) Educated Patient    Methods Explanation;Demonstration;Handout    Comprehension Verbalized understanding;Returned demonstration            PT Short Term Goals - 04/02/20 1327      PT SHORT TERM GOAL #1   Title  Pt will be independent with HEP for improved strength, balance, transfers, and gait.  TARGET 03/27/2020 (may be delayed due to scheduling)    Time 4    Period Weeks    Status Achieved      PT SHORT TERM GOAL #2   Title Pt will improve 5x sit<>stand to less than or equal to 12.5 sec to demonstrate improved functional strength and transfer efficiency.    Baseline 13.40 seconds 03/30/20    Time 4    Period Weeks    Status Not Met      PT SHORT TERM GOAL #3   Title Pt will improve TUG score to less than or equal to 13.5 seconds for decreased fall risk.    Baseline 9.47 seconds    Time 4    Period Weeks    Status Achieved      PT SHORT TERM GOAL #4   Title Pt will recover posterior balance in push and release test in 2 or less steps independently, for improved balance recovery.    Baseline posterior push and release: 3 small steps    Time 4    Period Weeks    Status Not Met      PT SHORT TERM GOAL #5   Title Pt will verbalize understanding of tips to reduce freezing with gait, steps, and turns.    Baseline educated on 04/02/20 and provided handout, pt able to verbalize understanding using teach back method    Time 4    Period Weeks    Status Achieved             PT Long Term Goals - 02/28/20 1437      PT LONG TERM GOAL #1   Title Pt will be independent with HEP for improved strength, balance, transfers, and gait. 04/24/2020    Time 8    Period Weeks    Status New      PT LONG TERM GOAL #2   Title Pt will perform 8 of 10 reps of sit<>stand from <18" surface with minimal to no UE support and no LOB, for improved functional strength and transfer efficiency.    Time 8    Period Weeks    Status New      PT LONG TERM GOAL #3   Title Pt will improve TUG cognitive score to less than or equal to 15 seconds for decreased fall risk.    Baseline 20.28 sec    Time 8    Period Weeks    Status New      PT LONG TERM GOAL #4   Title Pt will improve MiniBESTest score to at least  20/28 for decreased fall risk.  Baseline 14/28 at eval    Time 8    Period Weeks    Status New      PT LONG TERM GOAL #5   Title Pt will ambulate at least 1000 ft, indoors/outdoor surfaces independently (versus appropriate assistive device) for improved community gait.    Time 8    Period Weeks    Status New      Additional Long Term Goals   Additional Long Term Goals Yes      PT LONG TERM GOAL #6   Title Pt will verbalize understanding of local Parkinson's disease resources, including options for continue community fitness.    Time 8    Period Weeks    Status New                 Plan - 04/09/20 1729    Clinical Impression Statement 10th Visit Progress Note, covering dates 02/28/2020-04/09/2020.  Subjective reports:  Pt feels she is moving better when she's focused on larger movement patterns.  Objective measures:  TUG 9.47 seconds (improved from 17.03 sec), 5x sit<>stand:  13.4 sec (improved from 15.85 sec).  Pt takes >2 steps to recover in posterior direction in push and release test.  Pt met 3 of 5 STGs assessed last week and is progressing towards LTGs.  Pt responds well to cues from therapist and appears to be putting them into practice at home, and she does better with repetition in sessions.  With novel tasks and dual tasks, she slows pace of movements patterns.  She will continue to benefit from skilled PT to further address balance/gait deficits and work towards [place].    Personal Factors and Comorbidities Comorbidity 3+;Time since onset of injury/illness/exacerbation;Other   Recent addition of Sinemet to medications   Comorbidities glaucoma, osteopenia, TB (as a child)    Examination-Activity Limitations Transfers;Locomotion Level;Stand    Examination-Participation Restrictions Church;Community Activity    Stability/Clinical Decision Making Evolving/Moderate complexity    Rehab Potential Good    PT Frequency 2x / week    PT Duration 8 weeks   plus eval   PT  Treatment/Interventions ADLs/Self Care Home Management;Gait training;Functional mobility training;Stair training;Therapeutic activities;Therapeutic exercise;Balance training;Neuromuscular re-education;DME Instruction;Patient/family education    PT Next Visit Plan Continue stepping strategies (especially posterior-review addition to HEP). try standing PWR flow again; gait training with focus on posture, step length, and arm swing (responds well to cues for taking less # of steps). Dual tasking activities, working towards [place].    Consulted and Agree with Plan of Care Patient           Patient will benefit from skilled therapeutic intervention in order to improve the following deficits and impairments:  Abnormal gait, Difficulty walking, Impaired tone, Decreased balance, Impaired flexibility, Decreased mobility, Decreased strength, Postural dysfunction  Visit Diagnosis: Other abnormalities of gait and mobility  Unsteadiness on feet  Other symptoms and signs involving the nervous system     Problem List Patient Active Problem List   Diagnosis Date Noted  . Parkinson's disease ([place]) 02/17/2020  . Gait abnormality 12/04/2019  . Prediabetes 12/03/2018  . Vitamin D deficiency 12/03/2018  . Anemia due to vitamin B12 deficiency 12/03/2018  . Fatigue 12/03/2018  . Urinary frequency 10/01/2018  . Vitamin B12 deficiency 10/01/2018  . 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 Osteopenia 08/23/2012    Bensen [place]adderdon W. 04/09/2020, 5:35 PM Frazier Butt., PT  [facility]  [facility] [address] [place], [place], [address] Phone: [phone]   Fax:  [phone]  Name: Cindel Daugherty MRN: [id number] Date of Birth: 01/16/1951

## 2020-04-13 ENCOUNTER — Ambulatory Visit: Payer: 59 | Admitting: Physical Therapy

## 2020-04-14 ENCOUNTER — Other Ambulatory Visit: Payer: Self-pay

## 2020-04-14 ENCOUNTER — Encounter: Payer: 59 | Admitting: Occupational Therapy

## 2020-04-14 ENCOUNTER — Ambulatory Visit: Payer: 59 | Admitting: Physical Therapy

## 2020-04-14 DIAGNOSIS — R2689 Other abnormalities of gait and mobility: Secondary | ICD-10-CM | POA: Diagnosis not present

## 2020-04-14 DIAGNOSIS — R29818 Other symptoms and signs involving the nervous system: Secondary | ICD-10-CM

## 2020-04-14 DIAGNOSIS — R2681 Unsteadiness on feet: Secondary | ICD-10-CM

## 2020-04-14 DIAGNOSIS — M6281 Muscle weakness (generalized): Secondary | ICD-10-CM

## 2020-04-14 NOTE — Therapy (Signed)
[facility] [address] [place], [place], [address] Phone: [phone]   Fax:  [phone]  Physical Therapy Treatment  Patient Details  Name: Denise [name] MRN: [id number] Date of Birth: [birth year] Referring Provider (PT): Dr. Jaynee Eagles   Encounter Date: 04/14/2020   PT End of Session - 04/14/20 1026    Visit Number 11    Number of Visits 17    Date for PT Re-Evaluation 88/28/00   90 day cert for 8-wk [place]; VL 30    Authorization - Visit Number 11    Authorization - Number of Visits 30    PT Start Time [phone]    PT Stop Time 1014    PT Time Calculation (min) 43 min    Activity Tolerance Patient tolerated treatment well    Behavior During Therapy Denise [name] for tasks assessed/performed           Past Medical History:  Diagnosis Date  . Chronic kidney disease    as a child, no present issues   . Glaucoma   . Osteopenia 06/2018   T score -1.9 FRAX 3% / 0.2%  . 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 TUBAL LIGATION      There were no vitals filed for this visit.   Subjective Assessment - 04/14/20 0933    Subjective Things are going well at home. No changes. Has been counting her steps in her head and it has been making a difference.    Pertinent History glaucoma, osteopenia, TB (as a child)    Patient Stated Goals Pt's goals for therapy are to get stronger in legs and hands.    Currently in Pain? No/denies                             [facility] Adult PT Treatment/Exercise - 04/14/20 0957      Ambulation/Gait   Ambulation/Gait Yes    Ambulation/Gait Assistance 5: Supervision    Ambulation/Gait Assistance Details with dual tasking - asking pt to name animals from [place], pt demonstrating more slower pace at times, with verbal and demo cues intermittently to maintain larger movement patterns during gait, pt with difficulty  thinking of an animal at times needing verbal cues from therapist    Ambulation Distance (Feet) 300 Feet    Assistive device None    Gait Pattern Step-through pattern;Decreased arm swing - left;Decreased step length - right;Decreased step length - left;Right foot flat;Left foot flat;Decreased trunk rotation;Trunk flexed;Narrow base of support;Poor foot clearance - left;Poor foot clearance - right    Ambulation Surface Level;Indoor    Gait Comments for 20' in hallway x3 reps - counting pt steps, able to perform in 9-10 (same as last session on 04/09/20)                Balance Exercises - 04/14/20 0001      Balance Exercises: Standing   Stepping Strategy Anterior;Posterior;Foam/compliant surface;Limitations    Stepping Strategy Limitations on blue foam beam - stepping fwd > backwards (skipping midline) x12 reps B, cues for weight shift and stepping strategy, beginning with single UE support and progressing to none    Rockerboard Anterior/posterior    Rockerboard Limitations Alternating stepping on and off board in both posterior direction x10 reps and anterior direction x10 reps, cues for wider BOS on board, single UE support and  progressing to no UE support   Step Over Hurdles / Cones alternating stepping over 2 forward smaller orange hurdles x10 reps alternating B, cues for posture and foot clearance   Other Standing Exercises standing with orange hurdle anteriorly and 2 foam beams of 4" height laterally, naming out directions for pt to step over obstacles and in backwards direction, multiple reps, attempted 3 step commands vs. 2 with pt with incr difficulty remembering 3rd step and needing reminder cues            NMR:  Standing PWR! Moves Flow: PWR! Up>PWR! Rock>PWR! Twist>PWR! Step, x 6 reps with cues for technique, intensity   -reviewed posterior stepping exercise to pt's HEP: with single UE support at countertop and stepping back, cues for wider BOS between feet for improved  balance x10 reps B      PT Short Term Goals - 04/02/20 1327      PT SHORT TERM GOAL #1   Title Pt will be independent with HEP for improved strength, balance, transfers, and gait.  TARGET 03/27/2020 (may be delayed due to scheduling)    Time 4    Period Weeks    Status Achieved      PT SHORT TERM GOAL #2   Title Pt will improve 5x sit<>stand to less than or equal to 12.5 sec to demonstrate improved functional strength and transfer efficiency.    Baseline 13.40 seconds 03/30/20    Time 4    Period Weeks    Status Not Met      PT SHORT TERM GOAL #3   Title Pt will improve TUG score to less than or equal to 13.5 seconds for decreased fall risk.    Baseline 9.47 seconds    Time 4    Period Weeks    Status Achieved      PT SHORT TERM GOAL #4   Title Pt will recover posterior balance in push and release test in 2 or less steps independently, for improved balance recovery.    Baseline posterior push and release: 3 small steps    Time 4    Period Weeks    Status Not Met      PT SHORT TERM GOAL #5   Title Pt will verbalize understanding of tips to reduce freezing with gait, steps, and turns.    Baseline educated on 04/02/20 and provided handout, pt able to verbalize understanding using teach back method    Time 4    Period Weeks    Status Achieved             PT Long Term Goals - 02/28/20 1437      PT LONG TERM GOAL #1   Title Pt will be independent with HEP for improved strength, balance, transfers, and gait. 04/24/2020    Time 8    Period Weeks    Status New      PT LONG TERM GOAL #2   Title Pt will perform 8 of 10 reps of sit<>stand from <18" surface with minimal to no UE support and no LOB, for improved functional strength and transfer efficiency.    Time 8    Period Weeks    Status New      PT LONG TERM GOAL #3   Title Pt will improve TUG cognitive score to less than or equal to 15 seconds for decreased fall risk.    Baseline 20.28 sec    Time 8    Period Weeks  Status New      PT LONG TERM GOAL #4   Title Pt will improve MiniBESTest score to at least 20/28 for decreased fall risk.    Baseline 14/28 at eval    Time 8    Period Weeks    Status New      PT LONG TERM GOAL #5   Title Pt will ambulate at least 1000 ft, indoors/outdoor surfaces independently (versus appropriate assistive device) for improved community gait.    Time 8    Period Weeks    Status New      Additional Long Term Goals   Additional Long Term Goals Yes      PT LONG TERM GOAL #6   Title Pt will verbalize understanding of local Parkinson's disease resources, including options for continue community fitness.    Time 8    Period Weeks    Status New                 Plan - 04/14/20 1031    Clinical Impression Statement Today's skilled session continued to work on cognitive dual tasking with gait and balance activities and stepping strategies for balance. With cognitive dual tasking with gait pt does demo decr LUE arm swing and more slowed movement patterns without visual/demo cues. Pt with incr difficulty with following 3 step commands for additional cognitive challenge with stepping strategies for balance. Will continue to progress towards LTGs.    Personal Factors and Comorbidities Comorbidity 3+;Time since onset of injury/illness/exacerbation;Other   Recent addition of Sinemet to medications   Comorbidities glaucoma, osteopenia, TB (as a child)    Examination-Activity Limitations Transfers;Locomotion Level;Stand    Examination-Participation Restrictions Church;Community Activity    Stability/Clinical Decision Making Evolving/Moderate complexity    Rehab Potential Good    PT Frequency 2x / week    PT Duration 8 weeks   plus eval   PT Treatment/Interventions ADLs/Self Care Home Management;Gait training;Functional mobility training;Stair training;Therapeutic activities;Therapeutic exercise;Balance training;Neuromuscular re-education;DME Instruction;Patient/family  education    PT Next Visit Plan Continue stepping strategies. try standing PWR flow again or try PWR on compliant surfaces for balance; gait training with focus on posture, step length, and arm swing (responds well to cues for taking less # of steps). Dual tasking activities, working towards [place].    Consulted and Agree with Plan of Care Patient           Patient will benefit from skilled therapeutic intervention in order to improve the following deficits and impairments:  Abnormal gait, Difficulty walking, Impaired tone, Decreased balance, Impaired flexibility, Decreased mobility, Decreased strength, Postural dysfunction  Visit Diagnosis: Other abnormalities of gait and mobility  Unsteadiness on feet  Other symptoms and signs involving the nervous system  Muscle weakness (generalized)     Problem List Patient Active Problem List   Diagnosis Date Noted  . Parkinson's disease ([place]) 02/17/2020  . Gait abnormality 12/04/2019  . Prediabetes 12/03/2018  . Vitamin D deficiency 12/03/2018  . Anemia due to vitamin B12 deficiency 12/03/2018  . Fatigue 12/03/2018  . Urinary frequency 10/01/2018  . Vitamin B12 deficiency 10/01/2018  . 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 Osteopenia 08/23/2012    Arliss Journey, PT, DPT  04/14/2020, 10:35 AM  [facility] [address] [place], [place], [address] Phone: [phone]   Fax:  [phone]  Name: Shaneque Merkle MRN: [id number] Date of Birth:  Oct 28, 1950

## 2020-04-17 ENCOUNTER — Other Ambulatory Visit: Payer: Self-pay

## 2020-04-17 ENCOUNTER — Ambulatory Visit: Payer: 59 | Admitting: Physical Therapy

## 2020-04-17 ENCOUNTER — Ambulatory Visit: Payer: 59 | Admitting: Occupational Therapy

## 2020-04-17 DIAGNOSIS — R278 Other lack of coordination: Secondary | ICD-10-CM

## 2020-04-17 DIAGNOSIS — R293 Abnormal posture: Secondary | ICD-10-CM

## 2020-04-17 DIAGNOSIS — M6281 Muscle weakness (generalized): Secondary | ICD-10-CM

## 2020-04-17 DIAGNOSIS — R2689 Other abnormalities of gait and mobility: Secondary | ICD-10-CM | POA: Diagnosis not present

## 2020-04-17 DIAGNOSIS — R29818 Other symptoms and signs involving the nervous system: Secondary | ICD-10-CM

## 2020-04-17 NOTE — Therapy (Signed)
[facility] [address] [place], [place], [address] Phone: [phone]   Fax:  [phone]  Physical Therapy Treatment  Patient Details  Name: Denise [name] MRN: [id number] Date of Birth: January 20, 1951 Referring Provider (PT): Dr. Jaynee Eagles   Encounter Date: 04/17/2020   PT End of Session - 04/17/20 1014    Visit Number 12    Number of Visits 17    Date for PT Re-Evaluation 61/22/44   90 day cert for 8-wk [place]; VL 30    Authorization - Visit Number 12    Authorization - Number of Visits 30    PT Start Time 9753    PT Stop Time 0051   pt needing to leave early to go to a funeral   PT Time Calculation (min) 30 min    Equipment Utilized During Treatment Gait belt    Activity Tolerance Patient tolerated treatment well    Behavior During Therapy [facility] for tasks assessed/performed           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   Subjective Assessment - 04/17/20 0936    Subjective Balance feels like its getting better. Has been incorporating the standing PWR flow.    Pertinent History glaucoma, osteopenia, TB (as a child)    Patient Stated Goals Pt's goals for therapy are to get stronger in legs and hands.    Currently in Pain? No/denies                       NMR:    Pt performs PWR! Moves in standing position standing on blue air ex for compliant surface. Chair anteriorly in case needed for balance.   PWR! Up for improved posture x10 reps  PWR! Rock for improved weighshifting x10 reps both sides, cues to look up at hands  Cues for loud counting for each rep.       [facility]  Treatment/Exercise - 04/17/20 0001      Ambulation/Gait   Ambulation/Gait Yes    Ambulation/Gait Assistance 5: Supervision    Ambulation/Gait Assistance Details on grass surfaces - working on big walking with verbal and demo cues for arm swing, then performing dual tasking on grass focusing on big steps while tossing small ball, cues for big hands when throwing - pt slowly down gait speed. then performing gait on pavement and into clinic - naming foods alphabetically, needing cues throughout for arm swing, pt with tendency to lose arm swing during cognitive tasks    Ambulation Distance (Feet) 400 Feet   x1, 500 x 1   Assistive device None    Gait Pattern Step-through pattern;Decreased arm swing - left;Decreased step length - right;Decreased step length - left;Right foot flat;Left foot flat;Decreased trunk rotation;Trunk flexed;Narrow base of support;Poor foot clearance - left;Poor foot clearance - right    Ambulation Surface Level;Indoor;Unlevel;Outdoor;Paved;Grass      Neuro Re-ed    Neuro Re-ed Details  multi directional stepping activity following instructions on sheet: reading forwards down and then backwards up and progressing to doing opposite  of right/left direction, cues to say out loud the stepping direction and for incr foot clearance and weight shift                  PT Education - 04/17/20 1014    Education Details performing standing PWR flow 3x per week.    Person(s) Educated Patient    Methods Explanation    Comprehension Verbalized understanding            PT Short Term Goals - 04/02/20 1327      PT SHORT TERM GOAL #1   Title Pt will be independent with HEP for improved strength, balance, transfers, and gait.  TARGET 03/27/2020 (may be delayed due to scheduling)    Time 4    Period Weeks    Status Achieved      PT SHORT TERM GOAL #2   Title Pt will improve 5x sit<>stand to less than or equal to 12.5 sec to demonstrate improved functional strength and transfer  efficiency.    Baseline 13.40 seconds 03/30/20    Time 4    Period Weeks    Status Not Met      PT SHORT TERM GOAL #3   Title Pt will improve TUG score to less than or equal to 13.5 seconds for decreased fall risk.    Baseline 9.47 seconds    Time 4    Period Weeks    Status Achieved      PT SHORT TERM GOAL #4   Title Pt will recover posterior balance in push and release test in 2 or less steps independently, for improved balance recovery.    Baseline posterior push and release: 3 small steps    Time 4    Period Weeks    Status Not Met      PT SHORT TERM GOAL #5   Title Pt will verbalize understanding of tips to reduce freezing with gait, steps, and turns.    Baseline educated on 04/02/20 and provided handout, pt able to verbalize understanding using teach back method    Time 4    Period Weeks    Status Achieved             PT Long Term Goals - 02/28/20 1437      PT LONG TERM GOAL #1   Title Pt will be independent with HEP for improved strength, balance, transfers, and gait. 04/24/2020    Time 8    Period Weeks    Status New      PT LONG TERM GOAL #2   Title Pt will perform 8 of 10 reps of sit<>stand from <18" surface with minimal to no UE support and no LOB, for improved functional strength and transfer efficiency.    Time 8    Period Weeks    Status New      PT LONG TERM GOAL #3   Title Pt will improve TUG cognitive score to less than or equal to 15 seconds for decreased fall risk.    Baseline 20.28 sec    Time 8    Period Weeks    Status New      PT LONG TERM GOAL #4   Title Pt will improve MiniBESTest score to at least 20/28 for decreased fall risk.    Baseline 14/28 at eval    Time 8    Period Weeks    Status New      PT LONG TERM GOAL #5   Title Pt will  ambulate at least 1000 ft, indoors/outdoor surfaces independently (versus appropriate assistive device) for improved community gait.    Time 8    Period Weeks    Status New      Additional Long  Term Goals   Additional Long Term Goals Yes      PT LONG TERM GOAL #6   Title Pt will verbalize understanding of local Parkinson's disease resources, including options for continue community fitness.    Time 8    Period Weeks    Status New                 Plan - 04/17/20 1022    Clinical Impression Statement Pt needing to leave session early to make it in time for a funeral. Today's skilled session focused on standing PWR moves on compliant surfaces, gait and balance training with cognitive dual tasking. With cognitive task, pt demonstrates decr L>R arm swing during gait, needing verbal and demo cues to maintain arm swing. Pt also with slower gait speed with dual tasking of tossing and catching a ball. Will continue to progress towards LTGs.    Personal Factors and Comorbidities Comorbidity 3+;Time since onset of injury/illness/exacerbation;Other   Recent addition of Sinemet to medications   Comorbidities glaucoma, osteopenia, TB (as a child)    Examination-Activity Limitations Transfers;Locomotion Level;Stand    Examination-Participation Restrictions Church;Community Activity    Stability/Clinical Decision Making Evolving/Moderate complexity    Rehab Potential Good    PT Frequency 2x / week    PT Duration 8 weeks   plus eval   PT Treatment/Interventions ADLs/Self Care Home Management;Gait training;Functional mobility training;Stair training;Therapeutic activities;Therapeutic exercise;Balance training;Neuromuscular re-education;DME Instruction;Patient/family education    PT Next Visit Plan pt has 3 sessions left- begin to finalize HEP and community program. Continue stepping strategies. dual tasking with gait/balance. try PWR on compliant surfaces for balance; gait training with focus on posture, step length, and arm swing (responds well to cues for taking less # of steps). working towards [employer].    Consulted and Agree with Plan of Care Patient           Patient will benefit from  skilled therapeutic intervention in order to improve the following deficits and impairments:  Abnormal gait, Difficulty walking, Impaired tone, Decreased balance, Impaired flexibility, Decreased mobility, Decreased strength, Postural dysfunction  Visit Diagnosis: Other lack of coordination  Muscle weakness (generalized)  Abnormal posture  Other symptoms and signs involving the nervous system     Problem List Patient Active Problem List   Diagnosis Date Noted   Parkinson's disease ([place]) 02/17/2020   Gait abnormality 12/04/2019   Prediabetes 12/03/2018   Vitamin D deficiency 12/03/2018   Anemia due to vitamin B12 deficiency 12/03/2018   Fatigue 12/03/2018   Urinary frequency 10/01/2018   Vitamin B12 deficiency 10/01/2018   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

## 2020-04-17 NOTE — Therapy (Signed)
[facility] [address] [place], [place], [address] Phone: [phone]   Fax:  [phone]  Occupational Therapy Treatment  Patient Details  Name: Denise [name] MRN: [id number] Date of Birth: 21-Dec-1950 Referring Provider (OT): Dr. Jaynee Eagles   Encounter Date: 04/17/2020   OT End of Session - 04/17/20 0905    Visit Number 3    Number of Visits 9    Date for OT Re-Evaluation 05/08/20    Authorization Type [employer]    OT Start Time [phone]    OT Stop Time 0930    OT Time Calculation (min) 40 min           Past Medical History:  Diagnosis Date  . Chronic kidney disease    as a child, no present issues   . Glaucoma   . Osteopenia 06/2018   T score -1.9 FRAX 3% / 0.2%  . 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 TUBAL LIGATION      There were no vitals filed for this visit.   Subjective Assessment - 04/17/20 0920    Subjective  [name] pain    Pertinent History Newly diagnosed PD    Limitations Pt drives from an hour away    Patient Stated Goals improve handwriting,    Currently in Pain? No/denies              Treatment:Reviewed coordination HEP and discussed how the movements can carryover to functional activities.                  OT Education - 04/17/20 0910    Education Details Reviewed PWR! basic 4 seated, pt has handout at home 10 reps, PWR hands 5-10 reps each, reveiwed coordination HEP with big movements    Person(s) Educated Patient    Methods Explanation;Demonstration;Handout    Comprehension Verbalized understanding;Returned demonstration               OT Long Term Goals - 03/13/20 1346      OT LONG TERM GOAL #1   Title I with PD specific HEP    Time 4    Period Weeks    Status New    Target Date 05/08/20   delayed start in tx     OT LONG TERM GOAL #2   Title Pt will verbalize understanding of adapted strategies  to maximize safety and I with ADLs/ IADLs .    Time 4    Period Weeks    Status New      OT LONG TERM GOAL #3   Title Pt will verbalize understanding of ways to prevent future PD related complications and PD community resources.    Time 4    Period Weeks    Status New      OT LONG TERM GOAL #4   Title Pt will write a short paragraph with 100% legibility and no significant decrease in letter size    Time 4    Period Weeks    Status New      OT LONG TERM GOAL #5   Title Pt will demonstrate increased ease with dressing as eveidnced by decreasing PPT#4(don/ doff jacket) to 15 secs or less    Baseline 18.19 secs    Time 4    Period Weeks    Status New      Long Term Additional Goals   Additional Long Term Goals Yes  OT LONG TERM GOAL #6   Title Pt will demonstrate ability to retrieve a lightweight object from overhead shelf with  -10 elbow extension with RUE    Baseline sh flex140, elbow ext -15    Time 4    Period Weeks    Status New                 Plan - 04/17/20 0912    Clinical Impression Statement Pt is progressing towards goals. She responds well to verbal cues for larger amplitude movements with functional activity.    OT Occupational Profile and History Problem Focused Assessment - Including review of records relating to presenting problem    Occupational performance deficits (Please refer to evaluation for details): ADL's;IADL's;Work;Leisure;Social Participation    Body Structure / Function / Physical Skills ADL;Balance;Mobility;Strength;Flexibility;UE functional use;FMC;Gait;Coordination;Decreased knowledge of precautions;GMC;ROM;Decreased knowledge of use of DME;Dexterity;IADL    Rehab Potential Good    Clinical Decision Making Limited treatment options, no task modification necessary    Comorbidities Affecting Occupational Performance: May have comorbidities impacting occupational performance    Modification or Assistance to Complete Evaluation  No  modification of tasks or assist necessary to complete eval    OT Frequency 2x / week    OT Duration 4 weeks    OT Treatment/Interventions Self-care/ADL training;Therapeutic exercise;Balance training;Functional Mobility Training;Manual Therapy;Neuromuscular education;Therapeutic activities;DME and/or AE instruction;Patient/family education;Passive range of motion;Moist Heat;Cognitive remediation/compensation;Energy conservation    Plan consider PWR! modified quadraped, big movements with ADLs, pt is newly diagnosed with PD- pt drives from 1 hr away, only scheduled for 4 weeks   Consulted and Agree with Plan of Care Patient           Patient will benefit from skilled therapeutic intervention in order to improve the following deficits and impairments:   Body Structure / Function / Physical Skills: ADL, Balance, Mobility, Strength, Flexibility, UE functional use, FMC, Gait, Coordination, Decreased knowledge of precautions, GMC, ROM, Decreased knowledge of use of DME, Dexterity, IADL       Visit Diagnosis: Muscle weakness (generalized)  Other lack of coordination  Abnormal posture  Other symptoms and signs involving the nervous system    Problem List Patient Active Problem List   Diagnosis Date Noted  . Parkinson's disease ([place]) 02/17/2020  . Gait abnormality 12/04/2019  . Prediabetes 12/03/2018  . Vitamin D deficiency 12/03/2018  . Anemia due to vitamin B12 deficiency 12/03/2018  . Fatigue 12/03/2018  . Urinary frequency 10/01/2018  . Vitamin B12 deficiency 10/01/2018  . 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 Osteopenia 08/23/2012    Denise [name] 04/17/2020, 9:21 AM Theone Murdoch, OTR/L [phone] Phone: [phone] 9:25 AM 04/17/20 [facility] [address] [place], [place], [address] Phone:  [phone]   Fax:  [phone]  Name: Denise [name] MRN: [id number] Date of Birth: 06-02-1951

## 2020-04-17 NOTE — Patient Instructions (Addendum)

## 2020-04-20 ENCOUNTER — Other Ambulatory Visit: Payer: Self-pay

## 2020-04-20 ENCOUNTER — Ambulatory Visit: Payer: 59 | Admitting: Occupational Therapy

## 2020-04-20 ENCOUNTER — Encounter: Payer: Self-pay | Admitting: Occupational Therapy

## 2020-04-20 DIAGNOSIS — R293 Abnormal posture: Secondary | ICD-10-CM

## 2020-04-20 DIAGNOSIS — R278 Other lack of coordination: Secondary | ICD-10-CM

## 2020-04-20 DIAGNOSIS — R29818 Other symptoms and signs involving the nervous system: Secondary | ICD-10-CM

## 2020-04-20 DIAGNOSIS — R2689 Other abnormalities of gait and mobility: Secondary | ICD-10-CM | POA: Diagnosis not present

## 2020-04-20 DIAGNOSIS — R2681 Unsteadiness on feet: Secondary | ICD-10-CM

## 2020-04-20 NOTE — Therapy (Signed)
[facility] [address] [place], [place], [address] Phone: [phone]   Fax:  [phone]  Occupational Therapy Treatment  Patient Details  Name: Denise [name] MRN: [id number] Date of Birth: 1950-12-01 Referring Provider (OT): Dr. Jaynee Eagles   Encounter Date: 04/20/2020   OT End of Session - 04/20/20 1107    Visit Number 4    Number of Visits 9    Date for OT Re-Evaluation 05/08/20    Authorization Type [employer]    OT Start Time 1106    OT Stop Time 1145    OT Time Calculation (min) 39 min    Activity Tolerance Patient tolerated treatment well    Behavior During Therapy [facility] for tasks assessed/performed           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   Subjective Assessment - 04/20/20 1107    Subjective  "wow, this is great"    Pertinent History Newly diagnosed PD    Limitations Pt drives from an hour away    Patient Stated Goals improve handwriting,    Currently in Pain? No/denies              Practiced the following with use of large amplitude movement strategies after initial instruction with min cueing:  Opening bottles, grasping cylinder objects, simulated eating, donning/doffing jacket, fastening/unfastening buttons, sit>stand, scooting from table, functional reach.    Reviewed flipping cards and dealing cards with thumb with each hand with use of large amplitude movement strategies and pt returned demo with min cueing for incr movement amplitude.         OT Education - 04/20/20 1211    Education Details Large amplitude movement strategies for ADLs/IADLs; PD movement symptoms and use of large ampllitude  movements to decr risk for future complications, exercise for brain change, and incr ease    Person(s) Educated Patient    Methods Explanation;Demonstration;Verbal cues;Handout    Comprehension Returned demonstration;Verbalized understanding;Verbal cues required               OT Long Term Goals - 03/13/20 1346      OT LONG TERM GOAL #1   Title I with PD specific HEP    Time 4    Period Weeks    Status New    Target Date 05/08/20   delayed start in tx     OT LONG TERM GOAL #2   Title Pt will verbalize understanding of adapted strategies to maximize safety and I with ADLs/ IADLs .    Time 4    Period Weeks    Status New      OT LONG TERM GOAL #3   Title Pt will verbalize understanding of ways to prevent future PD related complications and PD community resources.    Time 4    Period Weeks    Status New      OT LONG TERM GOAL #4   Title Pt will write a short paragraph with 100% legibility and no significant decrease in letter size    Time 4    Period Weeks  Status New      OT LONG TERM GOAL #5   Title Pt will demonstrate increased ease with dressing as eveidnced by decreasing PPT#4(don/ doff jacket) to 15 secs or less    Baseline 18.19 secs    Time 4    Period Weeks    Status New      Long Term Additional Goals   Additional Long Term Goals Yes      OT LONG TERM GOAL #6   Title Pt will demonstrate ability to retrieve a lightweight object from overhead shelf with  -10 elbow extension with RUE    Baseline sh flex140, elbow ext -15    Time 4    Period Weeks    Status New                 Plan - 04/20/20 1212    Clinical Impression Statement Pt demo improved ease/movement quality with use of large amplitude movement strategies for ADLs/functional tasks after instruction.  Pt will benefit from reinforcement and cueing for carryover.    OT Occupational Profile and History Problem Focused Assessment - Including review of records relating to presenting problem      Occupational performance deficits (Please refer to evaluation for details): ADL's;IADL's;Work;Leisure;Social Participation    Body Structure / Function / Physical Skills ADL;Balance;Mobility;Strength;Flexibility;UE functional use;FMC;Gait;Coordination;Decreased knowledge of precautions;GMC;ROM;Decreased knowledge of use of DME;Dexterity;IADL    Rehab Potential Good    Clinical Decision Making Limited treatment options, no task modification necessary    Comorbidities Affecting Occupational Performance: May have comorbidities impacting occupational performance    Modification or Assistance to Complete Evaluation  No modification of tasks or assist necessary to complete eval    OT Frequency 2x / week    OT Duration 4 weeks    OT Treatment/Interventions Self-care/ADL training;Therapeutic exercise;Balance training;Functional Mobility Training;Manual Therapy;Neuromuscular education;Therapeutic activities;DME and/or AE instruction;Patient/family education;Passive range of motion;Moist Heat;Cognitive remediation/compensation;Energy conservation    Plan consider PWR! modified quadraped, continue with big movements with ADLs.    Consulted and Agree with Plan of Care Patient           Patient will benefit from skilled therapeutic intervention in order to improve the following deficits and impairments:   Body Structure / Function / Physical Skills: ADL, Balance, Mobility, Strength, Flexibility, UE functional use, FMC, Gait, Coordination, Decreased knowledge of precautions, GMC, ROM, Decreased knowledge of use of DME, Dexterity, IADL       Visit Diagnosis: Other lack of coordination  Abnormal posture  Other symptoms and signs involving the nervous system  Other abnormalities of gait and mobility  Unsteadiness on feet    Problem List Patient Active Problem List   Diagnosis Date Noted   Parkinson's disease ([place]) 02/17/2020   Gait abnormality 12/04/2019   Prediabetes 12/03/2018    Vitamin D deficiency 12/03/2018   Anemia due to vitamin B12 deficiency 12/03/2018   Fatigue 12/03/2018   Urinary frequency 10/01/2018   Vitamin B12 deficiency 10/01/2018   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 [place], [place], [address] Phone: [phone]   Fax:  [phone]  Name: Denise [name] MRN: [id number] Date of Birth: 1951/04/27   Vianne Bulls, OTR/L [facility] [address] [place], [place]  [address] [phone] phone [phone] 04/20/20 12:18 PM

## 2020-04-20 NOTE — Patient Instructions (Signed)
    Performing Daily Activities with Big Movements  Pick at least 2 activities a day and perform with BIG, DELIBERATE movements/effort.  Try different activities each day. This can make the activity easier and turn daily activities into exercise to prevent problems in the future!  If you are standing during the activity, make sure to keep feet apart and stand with good/big/PWR! UP posture.  Examples:  Dressing - Push arms in sleeves, twist when putting on jacket, push foot into pants, open hands to pull down shirt/put on socks/pull up pants  Buttoning - Open hands big (PWR! Hands) before fastening each button  Bathing - Wash/dry with long strokes  Brushing your teeth - Big, slow movements  Cutting food - Long deliberate cuts  Eating - Hold utensil in the middle, not the end  Picking up a cup/bottle - Open hand up big and get object all the way in palm  Opening jar/bottle - Move as much as you can with each turn  Putting on seatbelt - Twist when reaching  Hanging up clothes/getting clothes down from closet - Reach with big effort  Putting away groceries/dishes - Reach with big effort  Wiping counter/table - Move in big, long strokes  Stirring while cooking - Exaggerate movement  Cleaning windows - Move in big, long strokes  Sweeping - Move arms in big, long strokes  Vacuuming - Push with big movement  Folding clothes - Exaggerate arm movements  Washing car - Move in big, long strokes  Raking - Move arms in big, long strokes  Changing light bulb - Move as much as you can with each turn  Using a screwdriver - Move as much as you can with each turn  Walking into a store/restaurant - Walk with big steps, swing arms if able  Standing up from a chair/recliner/sofa - Scoot forward, lean forward, and stand with big effort

## 2020-04-22 ENCOUNTER — Other Ambulatory Visit: Payer: Self-pay | Admitting: Neurology

## 2020-04-22 ENCOUNTER — Telehealth: Payer: Self-pay | Admitting: Neurology

## 2020-04-22 MED ORDER — CARBIDOPA-LEVODOPA 25-100 MG PO TABS: ORAL_TABLET | ORAL | 0 refills | Status: DC

## 2020-04-22 NOTE — Addendum Note (Signed)
Addended by: Gildardo Griffes on: 04/22/2020 01:41 PM   Modules accepted: Orders

## 2020-04-22 NOTE — Telephone Encounter (Signed)
Pt has called to report she is down to 4 carbidopa-levodopa (SINEMET IR) 25-100 MG tablet which means she has enough for today but would 12 called in to last her until Monday the 25th.  Pt is asking they be called into [id number]

## 2020-04-22 NOTE — Telephone Encounter (Signed)
Refills sent to [facility].

## 2020-04-23 ENCOUNTER — Ambulatory Visit: Payer: 59 | Admitting: Occupational Therapy

## 2020-04-23 ENCOUNTER — Ambulatory Visit: Payer: 59 | Admitting: Physical Therapy

## 2020-04-23 ENCOUNTER — Other Ambulatory Visit: Payer: Self-pay

## 2020-04-23 DIAGNOSIS — R2689 Other abnormalities of gait and mobility: Secondary | ICD-10-CM | POA: Diagnosis not present

## 2020-04-23 DIAGNOSIS — R278 Other lack of coordination: Secondary | ICD-10-CM

## 2020-04-23 DIAGNOSIS — R29818 Other symptoms and signs involving the nervous system: Secondary | ICD-10-CM

## 2020-04-23 DIAGNOSIS — M6281 Muscle weakness (generalized): Secondary | ICD-10-CM

## 2020-04-23 DIAGNOSIS — R293 Abnormal posture: Secondary | ICD-10-CM

## 2020-04-23 DIAGNOSIS — R2681 Unsteadiness on feet: Secondary | ICD-10-CM

## 2020-04-23 NOTE — Therapy (Signed)
[facility] [address] [place], [place], [address] Phone: [phone]   Fax:  [phone]  Occupational Therapy Treatment  Patient Details  Name: Denise [name] MRN: [id number] Date of Birth: 07/27/1950 Referring Provider (OT): Dr. Jaynee Eagles   Encounter Date: 04/23/2020   OT End of Session - 04/23/20 1216    Visit Number 5    Number of Visits 9    Date for OT Re-Evaluation 05/08/20    Authorization Type [employer]    Authorization Time Period week 3/4, plan to renew after next week, will need to check progress towards goals    OT Start Time 1152    OT Stop Time 1230    OT Time Calculation (min) 38 min    Activity Tolerance Patient tolerated treatment well    Behavior During Therapy [facility] for tasks assessed/performed           Past Medical History:  Diagnosis Date  . Chronic kidney disease    as a child, no present issues   . Glaucoma   . Osteopenia 06/2018   T score -1.9 FRAX 3% / 0.2%  . 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 TUBAL LIGATION      There were no vitals filed for this visit.   Subjective Assessment - 04/23/20 1201    Subjective  deneis pain    Pertinent History Newly diagnosed PD    Limitations Pt drives from an hour away    Patient Stated Goals improve handwriting,    Currently in Pain? No/denies                     Treatment : Arm bike x 5 mins level 1 for conditioning.           OT Education - 04/23/20 1155    Education Details PWR! basic 4 supine 10 reps each, min v.c and demonstration, reveiwed handwriting strategies and coban wrapped pen issued, reveiwed strategies for cutting food with big movements    Person(s) Educated Patient    Methods Explanation;Demonstration;Verbal cues;Handout    Comprehension Returned demonstration;Verbalized understanding;Verbal cues required               OT Long Term  Goals - 04/23/20 1304      OT LONG TERM GOAL #1   Title I with PD specific HEP    Time 4    Period Weeks    Status New      OT LONG TERM GOAL #2   Title Pt will verbalize understanding of adapted strategies to maximize safety and I with ADLs/ IADLs .    Time 4    Period Weeks    Status New      OT LONG TERM GOAL #3   Title Pt will verbalize understanding of ways to prevent future PD related complications and PD community resources.    Time 4    Period Weeks    Status New      OT LONG TERM GOAL #4   Title Pt will write a short paragraph with 100% legibility and no significant decrease in letter size    Time 4    Period Weeks    Status Achieved      OT LONG TERM GOAL #5   Title Pt will demonstrate increased ease with dressing as eveidnced by decreasing PPT#4(don/ doff jacket) to 15 secs or less    Baseline  18.19 secs    Time 4    Period Weeks    Status New      OT LONG TERM GOAL #6   Title Pt will demonstrate ability to retrieve a lightweight object from overhead shelf with  -10 elbow extension with RUE    Baseline sh flex140, elbow ext -15    Time 4    Period Weeks    Status New                  Patient will benefit from skilled therapeutic intervention in order to improve the following deficits and impairments:           Visit Diagnosis: Other lack of coordination  Abnormal posture  Other symptoms and signs involving the nervous system  Other abnormalities of gait and mobility    Problem List Patient Active Problem List   Diagnosis Date Noted  . Parkinson's disease ([place]) 02/17/2020  . Gait abnormality 12/04/2019  . Prediabetes 12/03/2018  . Vitamin D deficiency 12/03/2018  . Anemia due to vitamin B12 deficiency 12/03/2018  . Fatigue 12/03/2018  . Urinary frequency 10/01/2018  . Vitamin B12 deficiency 10/01/2018  . 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 Abnormal auditory perception of both  ears 05/10/2016  . Osteopenia 08/23/2012    Denise [name] 04/23/2020, 1:04 PM  [facility] [address] [place], [place], [address] Phone: [phone]   Fax:  [phone]  Name: Denise [name] MRN: [id number] Date of Birth: [birth year]
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
## 2020-04-23 NOTE — Patient Instructions (Signed)
Optimal Fitness Program after Therapy for People with Parkinson's Disease  1)  Therapy Home Exercise Program  -Do these Exercises DAILY as instructed by your therapist  -Big, deliberate effort with exercises  -These exercises are important to perform consistently, even when therapist has  finished, because these therapy exercises often address your specific  Parkinson's difficulties   2)  Walking  -  Work up to walking 3-5 times per week, 20-30 minutes per day  -This can be done at home, driveway, quiet street or an indoor track  -Focus should be on your Best posture, arm swing, step length for your best walking pattern  3)  Aerobic Exercise  -Work up to 3-5 times per week, 30 minutes per day  -This can be stationary bike, seated stepper machine, elliptical machine  -Work up to 7-8/10 intensity during the exercise, at minimal to moderate     Resistance

## 2020-04-27 ENCOUNTER — Other Ambulatory Visit: Payer: Self-pay

## 2020-04-27 ENCOUNTER — Ambulatory Visit: Payer: 59 | Admitting: Occupational Therapy

## 2020-04-27 ENCOUNTER — Encounter: Payer: Self-pay | Admitting: Occupational Therapy

## 2020-04-27 ENCOUNTER — Ambulatory Visit: Payer: 59 | Admitting: Physical Therapy

## 2020-04-27 DIAGNOSIS — R2689 Other abnormalities of gait and mobility: Secondary | ICD-10-CM

## 2020-04-27 DIAGNOSIS — R278 Other lack of coordination: Secondary | ICD-10-CM

## 2020-04-27 DIAGNOSIS — R2681 Unsteadiness on feet: Secondary | ICD-10-CM

## 2020-04-27 DIAGNOSIS — R29818 Other symptoms and signs involving the nervous system: Secondary | ICD-10-CM

## 2020-04-27 DIAGNOSIS — M6281 Muscle weakness (generalized): Secondary | ICD-10-CM

## 2020-04-27 DIAGNOSIS — R293 Abnormal posture: Secondary | ICD-10-CM

## 2020-04-27 NOTE — Therapy (Signed)
[facility] [address] [place], [place], [address] Phone: [phone]   Fax:  [phone]  Physical Therapy Treatment  Patient Details  Name: Sakina Briones MRN: [id number] Date of Birth: 1950/08/30 Referring Provider (PT): Dr. Jaynee Eagles   Encounter Date: 04/27/2020   PT End of Session - 04/27/20 2232    Visit Number 14    Number of Visits 17    Date for PT Re-Evaluation 73/22/02   90 day cert for 8-wk [place]; VL 30 for PT    Authorization - Visit Number 14    Authorization - Number of Visits 30    PT Start Time 1232    PT Stop Time 1313    PT Time Calculation (min) 41 min    Equipment Utilized During Treatment Gait belt    Activity Tolerance Patient tolerated treatment well    Behavior During Therapy WFL for tasks assessed/performed           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   Subjective Assessment - 04/27/20 1235    Subjective Feels like she still needs work on her balance - does her exercises everyday. Still has to look into the [employer] membership.    Pertinent History glaucoma, osteopenia, TB (as a child)    Patient Stated Goals Pt's goals for therapy are to get stronger in legs and hands.    Currently in Pain? No/denies              [facility] PT Assessment - 04/27/20 1243      Mini-BESTest   Sit To Stand Normal: Comes to stand without use of hands and stabilizes independently.    Rise to Toes Moderate: Heels up, but not full range (smaller than when holding hands), OR noticeable instability for 3 s.   5.37 seconds, not full range   Stand on one leg (left) Moderate: < 20 s    4.44, 6.66   Stand on one leg (right) Moderate: < 20 s   5.12, 3.84   Stand on one leg - lowest score 1    Compensatory Stepping Correction - Forward Normal: Recovers independently with a single, large step (second realignement is allowed).    Compensatory Stepping Correction - Backward Moderate: More than one step is required to recover equilibrium   takes shorter, more shuffled steps   Compensatory Stepping Correction - Left Lateral Moderate: Several steps to recover equilibrium    Compensatory Stepping Correction - Right Lateral Moderate: Several steps to recover equilibrium    Stepping Corredtion Lateral - lowest score 1    Stance - Feet together, eyes open, firm surface  Normal: 30s    Stance - Feet together, eyes closed, foam surface  Moderate: < 30s   12.91 seconds   Incline - Eyes Closed Moderate: Stands independently < 30s OR aligns with surface   17.31 seconds   Change in Gait Speed Normal: Significantly changes walkling speed without imbalance    Walk with head turns - Horizontal Normal: performs head turns with no change in gait speed and good  balance    Walk with pivot turns Moderate:Turns with feet close SLOW (>4 steps) with good balance.    Step over obstacles Normal: Able to step over box with minimal change of gait speed and with good balance.    Timed UP & GO with Dual Task Moderate: Dual Task affects either counting OR walking (>10%) when compared to the TUG without Dual Task.    Mini-BEST total score 20      Timed Up and Go Test   Normal TUG (seconds) 12.41    Cognitive TUG (seconds) 19.94   starting at the number 77, counting back by 3                        [facility] Adult PT Treatment/Exercise - 04/27/20 1243      Therapeutic Activites    Therapeutic Activities Other Therapeutic Activities    Other Therapeutic Activities Discussed community fitness plan post discharge - pt reports that she has not yet had the chance to look into the [employer] or silver  sneakers. Provided pt with handout of local PD resources (pt states that she has misplaced one that she received previously). Pt asking if anyone will be following up with her from this clinic after discharge - therapist educating the purpose of PD screens vs. evals performed in ~6 months after discharge as a check in or to re-continue with therapy. Pt stating that she would not be ready to discharge later this week as she feels like she still needs to work on her balance. After beginning to assess goals, discussed with pt adding additional visits to [place]. Therapist double checked and pt's visit limit of 30 is for PT only vs. PT and OT combined                    PT Education - 04/27/20 1241    Education Details provided handout for community resources for PD (pt states that she lost her previous handout), extending POC to continue to work on dynamic gait, dual tasking, and balance strategies to decr fall risk    Person(s) Educated Patient    Methods Explanation;Handout    Comprehension Verbalized understanding            PT Short Term Goals - 04/02/20 1327      PT SHORT TERM GOAL #1   Title Pt will be independent with HEP for improved strength, balance, transfers, and gait.  TARGET 03/27/2020 (may be delayed due to scheduling)    Time 4    Period Weeks    Status Achieved      PT SHORT TERM GOAL #2   Title Pt will improve 5x sit<>stand to less than or equal to 12.5 sec to demonstrate improved functional strength and transfer efficiency.    Baseline 13.40 seconds 03/30/20    Time 4    Period Weeks    Status Not Met      PT SHORT TERM GOAL #3   Title Pt will improve TUG score to less than or equal to 13.5 seconds for decreased fall risk.    Baseline 9.47 seconds    Time 4    Period Weeks    Status Achieved      PT SHORT TERM GOAL #4   Title Pt will recover posterior balance in push and release test in 2 or less steps independently, for improved balance recovery.    Baseline  posterior push and release: 3 small steps  Time 4    Period Weeks    Status Not Met      PT SHORT TERM GOAL #5   Title Pt will verbalize understanding of tips to reduce freezing with gait, steps, and turns.    Baseline educated on 04/02/20 and provided handout, pt able to verbalize understanding using teach back method    Time 4    Period Weeks    Status Achieved             PT Long Term Goals - 04/27/20 1311      PT LONG TERM GOAL #1   Title Pt will be independent with HEP for improved strength, balance, transfers, and gait. Extended to 05/01/2020, as 1 week remains in POC    Time 8    Period Weeks    Status New      PT LONG TERM GOAL #2   Title Pt will perform 8 of 10 reps of sit<>stand from <18" surface with minimal to no UE support and no LOB, for improved functional strength and transfer efficiency.    Time 8    Period Weeks    Status New      PT LONG TERM GOAL #3   Title Pt will improve TUG cognitive score to less than or equal to 15 seconds for decreased fall risk.    Baseline 19.94 seconds on 04/27/20    Time 8    Period Weeks    Status Not Met      PT LONG TERM GOAL #4   Title Pt will improve MiniBESTest score to at least 20/28 for decreased fall risk.    Baseline 14/28 at eval, 20/28 on 04/27/20    Time 8    Period Weeks    Status Achieved      PT LONG TERM GOAL #5   Title Pt will ambulate at least 1000 ft, indoors/outdoor surfaces independently (versus appropriate assistive device) for improved community gait.    Time 8    Period Weeks    Status New      PT LONG TERM GOAL #6   Title Pt will verbalize understanding of local Parkinson's disease resources, including options for continue community fitness.    Time 8    Period Weeks    Status New                 Plan - 04/27/20 2246    Clinical Impression Statement Began to check pt's LTGs today - pt scoring a 20/28 on the miniBEST and pt meeting LTG #4. However, pt continues with difficulty  with stepping strategies in the posterior and lateral directions, needing to take 2-3 small shuffled steps in order to maintain balance. Pt also with difficulty with turns with performing with feet scuffing the floor, slow steps and with a more narrow BOS. Pt continues with difficulty with cognitive dual tasking during gait, pt's time of 19.94 seconds during the cog TUG indicates that pt is at an incr risk for falls. Was originally planning to D/C later this week, but after beginning to check LTGs and pt reporting she is not ready for discharge, will plan to re-cert for an additional 2x week for 3 weeks to continue to work on gait, dual tasking, and balance strategies to improve functional mobility and decr fall risk. Will assess remaining LTGs at next visit.    Personal Factors and Comorbidities Comorbidity 3+;Time since onset of injury/illness/exacerbation;Other   Recent addition of Sinemet to medications  Comorbidities glaucoma, osteopenia, TB (as a child)    Examination-Activity Limitations Transfers;Locomotion Level;Stand    Examination-Participation Restrictions Church;Community Activity    Stability/Clinical Decision Making Evolving/Moderate complexity    Rehab Potential Good    PT Frequency 2x / week    PT Duration 8 weeks   plus eval   PT Treatment/Interventions ADLs/Self Care Home Management;Gait training;Functional mobility training;Stair training;Therapeutic activities;Therapeutic exercise;Balance training;Neuromuscular re-education;DME Instruction;Patient/family education    PT Next Visit Plan check remaining LTGs. plan to re-cert for additional 2x week for 3 weeks. stepping strategies, cognitive dual tasking, turning practice.    Consulted and Agree with Plan of Care Patient           Patient will benefit from skilled therapeutic intervention in order to improve the following deficits and impairments:  Abnormal gait, Difficulty walking, Impaired tone, Decreased balance, Impaired  flexibility, Decreased mobility, Decreased strength, Postural dysfunction  Visit Diagnosis: Other symptoms and signs involving the nervous system  Other lack of coordination  Muscle weakness (generalized)  Unsteadiness on feet     Problem List Patient Active Problem List   Diagnosis Date Noted   Parkinson's disease ([place]) 02/17/2020   Gait abnormality 12/04/2019   Prediabetes 12/03/2018   Vitamin D deficiency 12/03/2018   Anemia due to vitamin B12 deficiency 12/03/2018   Fatigue 12/03/2018   Urinary frequency 10/01/2018   Vitamin B12 deficiency 10/01/2018   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

## 2020-04-27 NOTE — Therapy (Signed)
[facility] [address] [place], [place], [address] Phone: [phone]   Fax:  [phone]  Occupational Therapy Treatment  Patient Details  Name: Denise [name] MRN: [id number] Date of Birth: [birth year] Referring Provider (OT): Dr. Jaynee Eagles   Encounter Date: 04/27/2020   OT End of Session - 04/27/20 1106    Visit Number 6    Number of Visits 9    Date for OT Re-Evaluation 05/08/20    Authorization Type [employer]    Authorization Time Period week 4/4, plan to renew after this week, will need to check progress towards goals    OT Start Time 1106    OT Stop Time 1145    OT Time Calculation (min) 39 min    Activity Tolerance Patient tolerated treatment well    Behavior During Therapy [facility] for tasks assessed/performed           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   Subjective Assessment - 04/27/20 1106    Subjective  pt reports incr ease with use of large amplitude movements    Pertinent History Newly diagnosed PD    Limitations Pt drives from an hour away    Patient Stated Goals improve handwriting,    Currently in Pain? No/denies           Began checking goals and discussing progress--see below.  Practiced donning/doffing turtleneck sweater with min cueing for incr movement amplitude with hands and keeping feet apart.  Also recommended pt try putting head in first.  Practiced simulated donning/doffing jacket with large amplitude movements with min cueing initially for technique.  Picking up items from floor with min v.c. for large amplitude movement strategies after initial instruction and for big reach.  Making  direction turns in small space while carrying light item in BUEs with min cueing for large amplitude movement strategies after initial education.  Standing, diagonal step and reach with trunk rotation and wt. Shift with functional reach to grasp/release cylinder objects with min cueing for large amplitude movements  Standing, functional reaching with each UE to grasp/release cylinder objects with trunk rotation with min cueing for incr movement amplitude.      OT Long Term Goals - 04/27/20 1129      OT LONG TERM GOAL #1   Title I with PD specific HEP    Time 4    Period Weeks    Status New      OT LONG TERM GOAL #2   Title Pt will verbalize understanding of adapted strategies to maximize safety and I with ADLs/ IADLs .    Time 4    Period Weeks    Status New      OT LONG TERM GOAL #3   Title Pt will verbalize understanding of ways to prevent future PD related complications and PD community resources.    Time 4    Period Weeks    Status New      OT LONG TERM GOAL #4   Title Pt will write a short paragraph with 100% legibility and no significant decrease in letter size  Time 4    Period Weeks    Status Achieved      OT LONG TERM GOAL #5   Title Pt will demonstrate increased ease with dressing as eveidnced by decreasing PPT#4(don/ doff jacket) to 15 secs or less    Baseline 18.19 secs    Time 4    Period Weeks    Status On-going   04/27/20:  17.75sec     OT LONG TERM GOAL #6   Title Pt will demonstrate ability to retrieve a lightweight object from overhead shelf with  -10 elbow extension with RUE    Baseline sh flex140, elbow ext -15    Time 4    Period Weeks    Status Achieved   04/27/20:  145* with elbow ext WNL                Plan - 04/27/20 1107    Clinical Impression Statement Pt is progressing towards goals with improved movement amplitude and quality.  Pt would benefit from continued reinforcement in a variety of contexts to improve carryover.  Pt met  LTG #6.    OT Occupational Profile and History Problem Focused Assessment - Including review of records relating to presenting problem    Occupational performance deficits (Please refer to evaluation for details): ADL's;IADL's;Work;Leisure;Social Participation    Body Structure / Function / Physical Skills ADL;Balance;Mobility;Strength;Flexibility;UE functional use;FMC;Gait;Coordination;Decreased knowledge of precautions;GMC;ROM;Decreased knowledge of use of DME;Dexterity;IADL    Rehab Potential Good    Clinical Decision Making Limited treatment options, no task modification necessary    Comorbidities Affecting Occupational Performance: May have comorbidities impacting occupational performance    Modification or Assistance to Complete Evaluation  No modification of tasks or assist necessary to complete eval    OT Frequency 2x / week    OT Duration 4 weeks    OT Treatment/Interventions Self-care/ADL training;Therapeutic exercise;Balance training;Functional Mobility Training;Manual Therapy;Neuromuscular education;Therapeutic activities;DME and/or AE instruction;Patient/family education;Passive range of motion;Moist Heat;Cognitive remediation/compensation;Energy conservation    Plan check remaining goals and renew.  Consider PWR! modified quadraped, continue with big movements with ADLs (step and reach, ambulation with direction changes, picking up items from floor, etc).    Consulted and Agree with Plan of Care Patient           Patient will benefit from skilled therapeutic intervention in order to improve the following deficits and impairments:   Body Structure / Function / Physical Skills: ADL, Balance, Mobility, Strength, Flexibility, UE functional use, FMC, Gait, Coordination, Decreased knowledge of precautions, GMC, ROM, Decreased knowledge of use of DME, Dexterity, IADL       Visit Diagnosis: Other symptoms and signs involving the nervous system  Other lack of coordination  Muscle  weakness (generalized)  Unsteadiness on feet  Other abnormalities of gait and mobility  Abnormal posture    Problem List Patient Active Problem List   Diagnosis Date Noted   Parkinson's disease ([place]) 02/17/2020   Gait abnormality 12/04/2019   Prediabetes 12/03/2018   Vitamin D deficiency 12/03/2018   Anemia due to vitamin B12 deficiency 12/03/2018   Fatigue 12/03/2018   Urinary frequency 10/01/2018   Vitamin B12 deficiency 10/01/2018   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 [place], [place], [address] Phone: [phone]   Fax:  [phone]  Name: Ezzie Senat MRN: [id number]  Date of Birth: 01-14-1951   Vianne Bulls, OTR/L [facility] [address] [place], [place]  [address] [phone] phone [phone] 04/27/20 3:25 PM

## 2020-04-30 ENCOUNTER — Encounter: Payer: Self-pay | Admitting: Occupational Therapy

## 2020-04-30 ENCOUNTER — Other Ambulatory Visit: Payer: Self-pay

## 2020-04-30 ENCOUNTER — Ambulatory Visit: Payer: 59 | Admitting: Physical Therapy

## 2020-04-30 ENCOUNTER — Ambulatory Visit: Payer: 59 | Admitting: Occupational Therapy

## 2020-04-30 DIAGNOSIS — R29818 Other symptoms and signs involving the nervous system: Secondary | ICD-10-CM

## 2020-04-30 DIAGNOSIS — R2689 Other abnormalities of gait and mobility: Secondary | ICD-10-CM

## 2020-04-30 DIAGNOSIS — M6281 Muscle weakness (generalized): Secondary | ICD-10-CM

## 2020-04-30 DIAGNOSIS — R2681 Unsteadiness on feet: Secondary | ICD-10-CM

## 2020-04-30 DIAGNOSIS — R278 Other lack of coordination: Secondary | ICD-10-CM

## 2020-04-30 NOTE — Therapy (Signed)
[facility] [address] [place], [place], [address] Phone: [phone]   Fax:  [phone]  Occupational Therapy Treatment  Patient Details  Name: Denise [name] MRN: [id number] Date of Birth: 23-Sep-1950 Referring Provider (OT): Dr. Jaynee Eagles   Encounter Date: 04/30/2020   OT End of Session - 04/30/20 1628    Visit Number 7    Number of Visits 15    Date for OT Re-Evaluation 05/08/20    Authorization Type [employer]    Authorization Time Period renewed 04/30/20    Authorization - Visit Number 7    Authorization - Number of Visits 30    OT Start Time 7939    OT Stop Time 1145    OT Time Calculation (min) 40 min    Activity Tolerance Patient tolerated treatment well    Behavior During Therapy [facility] for tasks assessed/performed           Past Medical History:  Diagnosis Date  . Chronic kidney disease    as a child, no present issues   . Glaucoma   . Osteopenia 06/2018   T score -1.9 FRAX 3% / 0.2%  . 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 TUBAL LIGATION      There were no vitals filed for this visit.   Subjective Assessment - 04/30/20 1105    Subjective  pt reports incr ease with use of large amplitude movements    Pertinent History Newly diagnosed PD    Limitations Pt drives from an hour away    Patient Stated Goals improve handwriting,    Currently in Pain? No/denies                   Treatment:Dynamic step and reach to flip large playing cards, min v.c for larger amplitude movements Therapist checked standing functional reach and  discussed plans to continue therapy. Arm bike x 6 mins level 1 for conditioning, pt maintained 40 rpm Stacking coins with right and left UE's, min v.c for larger amplitude movements             OT Education - 04/30/20 1113    Education Details modified quadraped basic 4 standing at tabletop/ sink, 10  reps each min v.c    Person(s) Educated Patient    Methods Explanation;Demonstration;Verbal cues;Handout    Comprehension Returned demonstration;Verbalized understanding;Verbal cues required               OT Long Term Goals - 04/30/20 1106      OT LONG TERM GOAL #1   Title I with PD specific HEP    Time 5    Period Weeks    Status On-going   ongoing needs reinforcement 04/29/20- pt demonstrates understanding of inital     OT LONG TERM GOAL #2   Title Pt will verbalize understanding of adapted strategies to maximize safety and I with ADLs/ IADLs .    Time 5    Period Weeks    Status On-going   can benefit from continued reinforcement-04/30/20   Target Date 06/04/20      OT LONG TERM GOAL #3   Title Pt will verbalize understanding of ways to prevent future PD related complications and PD community resources.    Time 5    Period Weeks    Status On-going      OT LONG TERM GOAL #4   Title Pt will write a short  paragraph with 100% legibility and no significant decrease in letter size    Time --    Period Weeks    Status Achieved      OT LONG TERM GOAL #5   Title Pt will demonstrate increased ease with dressing as eveidnced by decreasing PPT#4(don/ doff jacket) to 15 secs or less    Baseline 18.19 secs    Time 5    Period Weeks    Status On-going   04/27/20:  17.75sec     Long Term Additional Goals   Additional Long Term Goals Yes      OT LONG TERM GOAL #6   Title Pt will demonstrate ability to retrieve a lightweight object from overhead shelf with  -10 elbow extension with RUE    Baseline sh flex140, elbow ext -15    Time --    Period Weeks    Status Achieved   04/27/20:  145* with elbow ext WNL     OT LONG TERM GOAL #7   Title Pt will increase bilateral UE standing functional reach to 10 inches or greater to minimize fall risk and to increase I with ADLs.    Baseline RUE 8 in, LUE 9 inches    Time 5    Period Weeks    Status New                 Plan  - 04/30/20 1125    Clinical Impression Statement Pt demonstrates excellent overall progress. She can benefit from continued skilled occupational therapy to reinfoce ADL strategies and larger amplitude movements with functional activity.    OT Occupational Profile and History Problem Focused Assessment - Including review of records relating to presenting problem    Occupational performance deficits (Please refer to evaluation for details): ADL's;IADL's;Work;Leisure;Social Participation    Body Structure / Function / Physical Skills ADL;Balance;Mobility;Strength;Flexibility;UE functional use;FMC;Gait;Coordination;Decreased knowledge of precautions;GMC;ROM;Decreased knowledge of use of DME;Dexterity;IADL    OT Frequency 2x / week   starting next week or 8 visits total   OT Duration 4 weeks    OT Treatment/Interventions Self-care/ADL training;Therapeutic exercise;Balance training;Functional Mobility Training;Manual Therapy;Neuromuscular education;Therapeutic activities;DME and/or AE instruction;Patient/family education;Passive range of motion;Moist Heat;Cognitive remediation/compensation;Energy conservation    Plan check remaining goals and renew.  Consider PWR! modified quadraped, continue with big movements with ADLs (step and reach, ambulation with direction changes, picking up items from floor, etc).           Patient will benefit from skilled therapeutic intervention in order to improve the following deficits and impairments:   Body Structure / Function / Physical Skills: ADL, Balance, Mobility, Strength, Flexibility, UE functional use, FMC, Gait, Coordination, Decreased knowledge of precautions, GMC, ROM, Decreased knowledge of use of DME, Dexterity, IADL       Visit Diagnosis: Other symptoms and signs involving the nervous system  Other lack of coordination  Muscle weakness (generalized)  Unsteadiness on feet    Problem List Patient Active Problem List   Diagnosis Date Noted  .  Parkinson's disease ([place]) 02/17/2020  . Gait abnormality 12/04/2019  . Prediabetes 12/03/2018  . Vitamin D deficiency 12/03/2018  . Anemia due to vitamin B12 deficiency 12/03/2018  . Fatigue 12/03/2018  . Urinary frequency 10/01/2018  . Vitamin B12 deficiency 10/01/2018  . 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 Osteopenia 08/23/2012    Mekel Haverstock 04/30/2020, 4:29 PM  [facility] [address]  [place], [place], [address] Phone: [phone]   Fax:  [phone]  Name: Tonesha Tsou MRN: [id number] Date of Birth: [birth year]

## 2020-05-01 NOTE — Therapy (Signed)
[facility] [address] [place], [place], [address] Phone: [phone]   Fax:  [phone]  Physical Therapy Treatment  Patient Details  Name: Denise [name] MRN: [id number] Date of Birth: [birth year] Referring Provider (PT): Dr. Jaynee Eagles   Encounter Date: 04/30/2020   PT End of Session - 05/01/20 1351    Visit Number 15    Number of Visits 23    Date for PT Re-Evaluation 49/70/26   60 day recert for 4-wk POC   Authorization Type [employer]; VL 30 for PT    Authorization - Visit Number 15    Authorization - Number of Visits 30    PT Start Time 1020    PT Stop Time 1100    PT Time Calculation (min) 40 min    Equipment Utilized During Treatment Gait belt    Activity Tolerance Patient tolerated treatment well    Behavior During Therapy [facility] for tasks assessed/performed           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   Subjective Assessment - 04/30/20 1026    Subjective Doing exercises everyday; no pain.    Pertinent History glaucoma, osteopenia, TB (as a child)    Patient Stated Goals Pt's goals for therapy are to get stronger in legs and hands.    Currently in Pain? No/denies                             [facility] Adult PT Treatment/Exercise - 04/30/20 1020      Transfers   Transfers Sit to Stand;Stand to Sit    Sit to Stand 6: Modified independent (Device/Increase time);Without upper extremity assist;From bed;From chair/3-in-1    Stand to Sit 6: Modified independent (Device/Increase time);Without upper extremity assist;To bed;To chair/3-in-1    Number of Reps 10 reps;Other sets (comment)   3 sets, from bed, 18"  chair, 16" chair   Transfer Cueing Provided cues for ease of lower surface transfers:  use of rocking/momentum, make sure to get "nose over toes", forward boost with arms for added momentum to stand.    Comments From 16" surface, pt needs cues and extra time/reps for increased forward lean for upright standing.      Ambulation/Gait   Ambulation/Gait Yes    Ambulation/Gait Assistance 5: Supervision;6: Modified independent (Device/Increase time)    Ambulation/Gait Assistance Details Gait 80 ft x 6 reps, including turning practice and cues for attention to increased step length, arm swing    Ambulation Distance (Feet) 400 Feet    Assistive device None    Gait Pattern Step-through pattern;Decreased arm swing - left;Decreased step length - right;Decreased step length - left;Right foot flat;Left foot flat;Decreased trunk rotation;Trunk flexed;Narrow base of support;Poor foot clearance - left;Poor foot clearance - right    Ambulation Surface Level;Indoor    Gait Comments Cues for turning to change directions, for slower pace, marching/weigthshfiting turn to avoid pivot turn.  With cues and practice, pt able to return demo marching turn for improved turning to change directions.  Neuro Re-education: Performed standing PWR! Moves Flow, near counter for support if needed.  PWR! UP>PWR! Rock>PWR! Twist>PWR! Step, x 5 reps in sequence.  Pt needs verbal, visual cues for sequencing, big effort/intensity, and deliberate foot placement, extended elbows and open hands.    Balance Exercises - 04/30/20 1020      Balance Exercises: Standing   Stepping Strategy Anterior;Posterior;Foam/compliant surface;Limitations    Stepping Strategy Limitations On [employer], cues for foot clearance, deliberate stepping               PT Short Term Goals - 04/02/20 1327      PT SHORT TERM GOAL #1   Title Pt will be independent with HEP for improved strength, balance, transfers, and gait.  TARGET 03/27/2020 (may  be delayed due to scheduling)    Time 4    Period Weeks    Status Achieved      PT SHORT TERM GOAL #2   Title Pt will improve 5x sit<>stand to less than or equal to 12.5 sec to demonstrate improved functional strength and transfer efficiency.    Baseline 13.40 seconds 03/30/20    Time 4    Period Weeks    Status Not Met      PT SHORT TERM GOAL #3   Title Pt will improve TUG score to less than or equal to 13.5 seconds for decreased fall risk.    Baseline 9.47 seconds    Time 4    Period Weeks    Status Achieved      PT SHORT TERM GOAL #4   Title Pt will recover posterior balance in push and release test in 2 or less steps independently, for improved balance recovery.    Baseline posterior push and release: 3 small steps    Time 4    Period Weeks    Status Not Met      PT SHORT TERM GOAL #5   Title Pt will verbalize understanding of tips to reduce freezing with gait, steps, and turns.    Baseline educated on 04/02/20 and provided handout, pt able to verbalize understanding using teach back method    Time 4    Period Weeks    Status Achieved             PT Long Term Goals - 05/01/20 1352      PT LONG TERM GOAL #1   Title Pt will be independent with HEP for improved strength, balance, transfers, and gait. Extended to 05/01/2020, as 1 week remains in [place]    Baseline Cues at times needed for PWR! Moves Flow (most recent update to HEP)    Time 8    Period Weeks    Status Partially Met      PT LONG TERM GOAL #2   Title Pt will perform 8 of 10 reps of sit<>stand from <18" surface with minimal to no UE support and no LOB, for improved functional strength and transfer efficiency.    Baseline able to perform, but with extra time    Time 8    Period Weeks    Status Partially Met      PT LONG TERM GOAL #3   Title Pt will improve TUG cognitive score to less than or equal to 15 seconds for decreased fall risk.    Baseline 19.94 seconds on 04/27/20    Time 8    Period Weeks     Status Not Met      PT LONG TERM GOAL #  4   Title Pt will improve MiniBESTest score to at least 20/28 for decreased fall risk.    Baseline 14/28 at eval, 20/28 on 04/27/20    Time 8    Period Weeks    Status Achieved      PT LONG TERM GOAL #5   Title Pt will ambulate at least 1000 ft, indoors/outdoor surfaces independently (versus appropriate assistive device) for improved community gait.    Baseline mod I (increased time)    Time 8    Period Weeks    Status Partially Met      PT LONG TERM GOAL #6   Title Pt will verbalize understanding of local Parkinson's disease resources, including options for continue community fitness.    Baseline Initiated, needs reinforcement    Time 8    Period Weeks    Status On-going          Updated LTGs:      PT Long Term Goals - 05/01/20 1402      PT LONG TERM GOAL #1   Title Pt will be independent with HEP for improved strength, balance, transfers, and gait.  TARGET 05/29/2020    Baseline Cues at times needed for PWR! Moves Flow (most recent update to HEP)    Time 4    Period Weeks    Status On-going      PT LONG TERM GOAL #2   Title Pt will perform 8 of 10 reps of sit<>stand from <18" surface with minimal to no UE support and no LOB, for improved functional strength and transfer efficiency.    Baseline able to perform, but with extra time    Time 4    Period Weeks    Status On-going      PT LONG TERM GOAL #3   Title Pt will improve TUG cognitive score to less than or equal to 15 seconds for decreased fall risk.    Baseline 19.94 seconds on 04/27/20    Time 4    Period Weeks    Status On-going      PT LONG TERM GOAL #4   Title Pt will improve MiniBESTest score to at least 23/28 for decreased fall risk.    Baseline 14/28 at eval, 20/28 on 04/27/20    Time 4    Period Weeks    Status Revised      PT LONG TERM GOAL #5   Title Pt will ambulate at least 1000 ft, indoors/outdoor surfaces independently (versus appropriate assistive  device) for improved community gait.    Baseline mod I (increased time)    Time 4    Period Weeks    Status On-going      PT LONG TERM GOAL #6   Title Pt will verbalize understanding of local Parkinson's disease resources, including options for continue community fitness.    Baseline Initiated, needs reinforcement    Time 4    Period Weeks    Status On-going                Plan - 05/01/20 1354    Clinical Impression Statement Checked remaining LTGs this visit, with pt partially meeting LTG 1 for HEP, LTG 2 for sit<>stand, and LTG 5 for Parkinson's resources.  She will benefit from reinforcement with each of these activities, as well as balance and dual tasking activities, as noted by MiniBESTest score of 20/28 and TUG cognitive score of 19.94 sec.  She demonstrates motivation for exercise performance at home, but with  addition of cognitive or challenging tasks, she slows pace of overall movement patterns.  She will continue to benefit from additional skilled PT to continue to address gait, dual tasking, balance for improved overall functional mobility and decreased fall risk.    Personal Factors and Comorbidities Comorbidity 3+;Time since onset of injury/illness/exacerbation;Other   Recent addition of Sinemet to medications   Comorbidities glaucoma, osteopenia, TB (as a child)    Examination-Activity Limitations Transfers;Locomotion Level;Stand    Examination-Participation Restrictions Church;Community Activity    Stability/Clinical Decision Making Evolving/Moderate complexity    Rehab Potential Good    PT Frequency 2x / week    PT Duration 4 weeks   per recert 69/45/0388   PT Treatment/Interventions ADLs/Self Care Home Management;Gait training;Functional mobility training;Stair training;Therapeutic activities;Therapeutic exercise;Balance training;Neuromuscular re-education;DME Instruction;Patient/family education    PT Next Visit Plan Note updated LTS; recert completed.  Continue  work on stepping and balance strategies, cognitive dual tasking, turning practice    Consulted and Agree with Plan of Care Patient           Patient will benefit from skilled therapeutic intervention in order to improve the following deficits and impairments:  Abnormal gait, Difficulty walking, Impaired tone, Decreased balance, Impaired flexibility, Decreased mobility, Decreased strength, Postural dysfunction  Visit Diagnosis: Other abnormalities of gait and mobility  Unsteadiness on feet  Muscle weakness (generalized)  Other symptoms and signs involving the nervous system     Problem List Patient Active Problem List   Diagnosis Date Noted   Parkinson's disease ([place]) 02/17/2020   Gait abnormality 12/04/2019   Prediabetes 12/03/2018   Vitamin D deficiency 12/03/2018   Anemia due to vitamin B12 deficiency 12/03/2018   Fatigue 12/03/2018   Urinary frequency 10/01/2018   Vitamin B12 deficiency 10/01/2018   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 05/01/2020, 1:58 PM Frazier Butt., PT   [facility] [address] [place], [place], [address] Phone: [phone]   Fax:  [phone]  Name: Denise [name] MRN: [id number] Date of Birth: [birth year]

## 2020-05-06 ENCOUNTER — Other Ambulatory Visit: Payer: Self-pay

## 2020-05-06 ENCOUNTER — Encounter: Payer: Self-pay | Admitting: Physical Therapy

## 2020-05-06 ENCOUNTER — Ambulatory Visit: Payer: 59 | Attending: Neurology | Admitting: Physical Therapy

## 2020-05-06 DIAGNOSIS — M6281 Muscle weakness (generalized): Secondary | ICD-10-CM

## 2020-05-06 DIAGNOSIS — R2689 Other abnormalities of gait and mobility: Secondary | ICD-10-CM

## 2020-05-06 DIAGNOSIS — R293 Abnormal posture: Secondary | ICD-10-CM | POA: Diagnosis present

## 2020-05-06 DIAGNOSIS — R278 Other lack of coordination: Secondary | ICD-10-CM | POA: Diagnosis present

## 2020-05-06 DIAGNOSIS — R2681 Unsteadiness on feet: Secondary | ICD-10-CM | POA: Insufficient documentation

## 2020-05-06 DIAGNOSIS — R29818 Other symptoms and signs involving the nervous system: Secondary | ICD-10-CM | POA: Diagnosis present

## 2020-05-06 NOTE — Therapy (Signed)
[facility] [address] [place], [place], [address] Phone: [phone]   Fax:  [phone]  Physical Therapy Treatment  Patient Details  Name: Denise [name] MRN: [id number] Date of Birth: September 07, 1950 Referring Provider (PT): Dr. Jaynee Eagles   Encounter Date: 05/06/2020   PT End of Session - 05/06/20 1648    Visit Number 16    Number of Visits 23    Date for PT Re-Evaluation 16/07/37   60 day recert for 4-wk [place]; VL 30 for PT    Authorization - Visit Number 16    Authorization - Number of Visits 30    PT Start Time 1400    PT Stop Time 1442    PT Time Calculation (min) 42 min    Activity Tolerance Patient tolerated treatment well    Behavior During Therapy [facility] for tasks assessed/performed           Past Medical History:  Diagnosis Date  . Chronic kidney disease    as a child, no present issues   . Glaucoma   . Osteopenia 06/2018   T score -1.9 FRAX 3% / 0.2%  . 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 TUBAL LIGATION      There were no vitals filed for this visit.   Subjective Assessment - 05/06/20 1402    Subjective No falls. Has not had the chance to look into the [employer]/silver sneakers.    Pertinent History glaucoma, osteopenia, TB (as a child)    Patient Stated Goals Pt's goals for therapy are to get stronger in legs and hands.    Currently in Pain? No/denies                       Neuro Re-education: Performed standing PWR! Moves Flow, near counter for support if needed.  PWR! UP>PWR! Rock>PWR! Twist>PWR! Step, x 5 reps in sequence.  Pt needs verbal, visual cues for sequencing, big effort/intensity, and deliberate foot placement, extended elbows and open hands. Pt performing PWR Step with decr B foot clearance despite cueing to hold onto the counter for UE support.  Performed an additional x10 reps B PWR Step with  use of 4" foam beam on each side of pt for incr foot clearance B, pt demonstrating improvements in foot clearance, removed foam beam and performed an additional x5 reps B with pt able to continue with incr foot clearance and intensity of movement. Pt reports she will focus more on this when performing at home.        [facility] Adult PT Treatment/Exercise - 05/06/20 1430      Transfers   Comments TUG shuttle: x3 reps with focus on big walking and marching while turning to sit back down to avoid pivoting on feet. then performing 3 reps with pt naming food alphabetical order a-z with pt decr gait speed and performing with decr arm swing and taking incr time.      High Level Balance   High Level Balance Comments multi directional stepping: following direction sheet, stepping over small obstacles in forward and lateral directions (2" obstacle) for incr foot clearance with stepping - beginning reading instructions top to bottom and then bottom to top (with pt with incr difficulty in this direction, taking multiple tries to perform correctly), removed obstacles and then adding in arm movements (arms out in PWR Up for forward and lateral  and forward for stepping backwards), with pt needing verbal and demo cues for proper sequencing and cues for incr foot clearance and keeping a wider BOS when stepping back to midline.                    PT Short Term Goals - 04/02/20 1327      PT SHORT TERM GOAL #1   Title Pt will be independent with HEP for improved strength, balance, transfers, and gait.  TARGET 03/27/2020 (may be delayed due to scheduling)    Time 4    Period Weeks    Status Achieved      PT SHORT TERM GOAL #2   Title Pt will improve 5x sit<>stand to less than or equal to 12.5 sec to demonstrate improved functional strength and transfer efficiency.    Baseline 13.40 seconds 03/30/20    Time 4    Period Weeks    Status Not Met      PT SHORT TERM GOAL #3   Title Pt will improve TUG score  to less than or equal to 13.5 seconds for decreased fall risk.    Baseline 9.47 seconds    Time 4    Period Weeks    Status Achieved      PT SHORT TERM GOAL #4   Title Pt will recover posterior balance in push and release test in 2 or less steps independently, for improved balance recovery.    Baseline posterior push and release: 3 small steps    Time 4    Period Weeks    Status Not Met      PT SHORT TERM GOAL #5   Title Pt will verbalize understanding of tips to reduce freezing with gait, steps, and turns.    Baseline educated on 04/02/20 and provided handout, pt able to verbalize understanding using teach back method    Time 4    Period Weeks    Status Achieved             PT Long Term Goals - 05/01/20 1402      PT LONG TERM GOAL #1   Title Pt will be independent with HEP for improved strength, balance, transfers, and gait.  TARGET 05/29/2020    Baseline Cues at times needed for PWR! Moves Flow (most recent update to HEP)    Time 4    Period Weeks    Status On-going      PT LONG TERM GOAL #2   Title Pt will perform 8 of 10 reps of sit<>stand from <18" surface with minimal to no UE support and no LOB, for improved functional strength and transfer efficiency.    Baseline able to perform, but with extra time    Time 4    Period Weeks    Status On-going      PT LONG TERM GOAL #3   Title Pt will improve TUG cognitive score to less than or equal to 15 seconds for decreased fall risk.    Baseline 19.94 seconds on 04/27/20    Time 4    Period Weeks    Status On-going      PT LONG TERM GOAL #4   Title Pt will improve MiniBESTest score to at least 23/28 for decreased fall risk.    Baseline 14/28 at eval, 20/28 on 04/27/20    Time 4    Period Weeks    Status Revised      PT LONG TERM GOAL #5  Title Pt will ambulate at least 1000 ft, indoors/outdoor surfaces independently (versus appropriate assistive device) for improved community gait.    Baseline mod I (increased  time)    Time 4    Period Weeks    Status On-going      PT LONG TERM GOAL #6   Title Pt will verbalize understanding of local Parkinson's disease resources, including options for continue community fitness.    Baseline Initiated, needs reinforcement    Time 4    Period Weeks    Status On-going                 Plan - 05/06/20 1653    Clinical Impression Statement During PWR moves flow, pt demonstrating decr foot clearance B with PWR Step! Performed PWR Step with 4" obstacle to step over laterally for improved foot clearance and pt able to maintain foot clearance and intensity of movement with obstacle removed. Pt with difficulty with cog dual tasking during TUG shuttle activity with slowed gait and decr arm swing. Will continue to progress towards LTGs.    Personal Factors and Comorbidities Comorbidity 3+;Time since onset of injury/illness/exacerbation;Other   Recent addition of Sinemet to medications   Comorbidities glaucoma, osteopenia, TB (as a child)    Examination-Activity Limitations Transfers;Locomotion Level;Stand    Examination-Participation Restrictions Church;Community Activity    Stability/Clinical Decision Making Evolving/Moderate complexity    Rehab Potential Good    PT Frequency 2x / week    PT Duration 4 weeks   per recert 67/73/7366   PT Treatment/Interventions ADLs/Self Care Home Management;Gait training;Functional mobility training;Stair training;Therapeutic activities;Therapeutic exercise;Balance training;Neuromuscular re-education;DME Instruction;Patient/family education    PT Next Visit Plan try PWR moves on compliant surfaces, try sit <> stands from lower surfaces.  Continue work on stepping and balance strategies, cognitive dual tasking, turning practice    Consulted and Agree with Plan of Care Patient           Patient will benefit from skilled therapeutic intervention in order to improve the following deficits and impairments:  Abnormal gait, Difficulty  walking, Impaired tone, Decreased balance, Impaired flexibility, Decreased mobility, Decreased strength, Postural dysfunction  Visit Diagnosis: Other abnormalities of gait and mobility  Unsteadiness on feet  Muscle weakness (generalized)  Other symptoms and signs involving the nervous system     Problem List Patient Active Problem List   Diagnosis Date Noted  . Parkinson's disease ([place]) 02/17/2020  . Gait abnormality 12/04/2019  . Prediabetes 12/03/2018  . Vitamin D deficiency 12/03/2018  . Anemia due to vitamin B12 deficiency 12/03/2018  . Fatigue 12/03/2018  . Urinary frequency 10/01/2018  . Vitamin B12 deficiency 10/01/2018  . 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 Osteopenia 08/23/2012    Arliss Journey, PT, DPT  05/06/2020, 4:54 PM  [facility] [address] [place], [place], [address] Phone: [phone]   Fax:  [phone]  Name: Denise [name] MRN: [id number] Date of Birth: 1950/12/25

## 2020-05-13 ENCOUNTER — Ambulatory Visit: Payer: 59 | Admitting: Physical Therapy

## 2020-05-13 ENCOUNTER — Other Ambulatory Visit: Payer: Self-pay

## 2020-05-13 DIAGNOSIS — R2689 Other abnormalities of gait and mobility: Secondary | ICD-10-CM | POA: Diagnosis not present

## 2020-05-13 DIAGNOSIS — M6281 Muscle weakness (generalized): Secondary | ICD-10-CM

## 2020-05-13 DIAGNOSIS — R2681 Unsteadiness on feet: Secondary | ICD-10-CM

## 2020-05-13 DIAGNOSIS — R29818 Other symptoms and signs involving the nervous system: Secondary | ICD-10-CM

## 2020-05-13 NOTE — Therapy (Signed)
[facility] [address] [place], [place], [address] Phone: [phone]   Fax:  [phone]  Physical Therapy Treatment  Patient Details  Name: Denise [name] MRN: [id number] Date of Birth: [birth year] Referring Provider (PT): Dr. Jaynee Eagles   Encounter Date: 05/13/2020   PT End of Session - 05/13/20 1501    Visit Number 17    Number of Visits 23    Date for PT Re-Evaluation 26/41/58   60 day recert for 4-wk [place]; VL 30 for PT    Authorization - Visit Number 17    Authorization - Number of Visits 30    PT Start Time 1406    PT Stop Time 1447    PT Time Calculation (min) 41 min    Equipment Utilized During Treatment Gait belt    Activity Tolerance Patient tolerated treatment well    Behavior During Therapy WFL for tasks assessed/performed           Past Medical History:  Diagnosis Date  . Chronic kidney disease    as a child, no present issues   . Glaucoma   . Osteopenia 06/2018   T score -1.9 FRAX 3% / 0.2%  . 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 TUBAL LIGATION      There were no vitals filed for this visit.   Subjective Assessment - 05/13/20 1408    Subjective Exercises have been going well. Has been looking into the silver sneakers at the [facility] in [place].    Pertinent History glaucoma, osteopenia, TB (as a child)    Patient Stated Goals Pt's goals for therapy are to get stronger in legs and hands.    Currently in Pain? No/denies                             [facility] Adult PT Treatment/Exercise - 05/13/20 0001      Ambulation/Gait   Ambulation/Gait Yes    Ambulation/Gait Assistance 5: Supervision;4: Min guard    Ambulation/Gait Assistance Details outdoors on grass/paved surfaces - demo and verbal cues initially for big walking and performing cognitive dual tasking (naming animals in the ocean, animals found in  [place], starting at 67 and counting backwards by 3), pt needing cues intermittently to maintain arm swing, pt with tendency to have decr L arm swing and slowed gait    Ambulation Distance (Feet) 500 Feet    Assistive device None    Gait Pattern Step-through pattern;Decreased arm swing - left;Decreased step length - right;Decreased step length - left;Right foot flat;Left foot flat;Decreased trunk rotation;Trunk flexed;Narrow base of support;Poor foot clearance - left;Poor foot clearance - right    Ambulation Surface Level;Indoor;Unlevel;Outdoor;Paved;Grass    Gait Comments performing additional dynamic gait x50' forward marching with cues for incr step height for SLS - min guard,and 2 x 50' retro gait with cues for incr step length B - min guard for balance            Pt performs PWR! Moves in standing position on air ex for compliant surface:    PWR! Up for improved posture x10 reps- initial demo cues   PWR! Rock for improved weighshifting -x10 reps B with cues to look up at hand, chair anteriorly as needed for balance   PWR! Twist for improved trunk rotation -x10 reps B, therapist posterior to pt for  balance and providing verbal/tactile cues for tall posture in between each  PWR! Step for improved step initiation - x10 reps B, holding onto counter for support, cues for incr foot clearance.       At countertop on level ground: PWR! Rock x10 reps B lifting up opposite leg for dynamic SLS, pt needing to have one hand on counter for balance support.     Balance Exercises - 05/13/20 0001      Balance Exercises: Standing   Wall Bumps Hip;Eyes opened;Eyes closed;Limitations    Wall Bumps Limitations on blue air ex, x10 reps eyes open, 2 x 5 reps eyes closed    Other Standing Exercises standing on blue foam beam at bottom of stair case: alternating SLS taps to 2nd step, beginning with UE support and progressing to none x10 reps B with min guard for balance    Other Standing Exercises  Comments on blue air ex: x10 reps heel toe raises with BUE support             PT Education - 05/13/20 1502    Education Details pt asking what exercises for HEP work on her balance - discussed about standing PWR moves and posterior stepping for stepping balance strategy, discussed with pt adding additional balance work for home at next session.    Person(s) Educated Patient    Methods Explanation    Comprehension Verbalized understanding            PT Short Term Goals - 04/02/20 1327      PT SHORT TERM GOAL #1   Title Pt will be independent with HEP for improved strength, balance, transfers, and gait.  TARGET 03/27/2020 (may be delayed due to scheduling)    Time 4    Period Weeks    Status Achieved      PT SHORT TERM GOAL #2   Title Pt will improve 5x sit<>stand to less than or equal to 12.5 sec to demonstrate improved functional strength and transfer efficiency.    Baseline 13.40 seconds 03/30/20    Time 4    Period Weeks    Status Not Met      PT SHORT TERM GOAL #3   Title Pt will improve TUG score to less than or equal to 13.5 seconds for decreased fall risk.    Baseline 9.47 seconds    Time 4    Period Weeks    Status Achieved      PT SHORT TERM GOAL #4   Title Pt will recover posterior balance in push and release test in 2 or less steps independently, for improved balance recovery.    Baseline posterior push and release: 3 small steps    Time 4    Period Weeks    Status Not Met      PT SHORT TERM GOAL #5   Title Pt will verbalize understanding of tips to reduce freezing with gait, steps, and turns.    Baseline educated on 04/02/20 and provided handout, pt able to verbalize understanding using teach back method    Time 4    Period Weeks    Status Achieved             PT Long Term Goals - 05/01/20 1402      PT LONG TERM GOAL #1   Title Pt will be independent with HEP for improved strength, balance, transfers, and gait.  TARGET 05/29/2020    Baseline Cues  at times needed for PWR! Moves Flow (  most recent update to HEP)    Time 4    Period Weeks    Status On-going      PT LONG TERM GOAL #2   Title Pt will perform 8 of 10 reps of sit<>stand from <18" surface with minimal to no UE support and no LOB, for improved functional strength and transfer efficiency.    Baseline able to perform, but with extra time    Time 4    Period Weeks    Status On-going      PT LONG TERM GOAL #3   Title Pt will improve TUG cognitive score to less than or equal to 15 seconds for decreased fall risk.    Baseline 19.94 seconds on 04/27/20    Time 4    Period Weeks    Status On-going      PT LONG TERM GOAL #4   Title Pt will improve MiniBESTest score to at least 23/28 for decreased fall risk.    Baseline 14/28 at eval, 20/28 on 04/27/20    Time 4    Period Weeks    Status Revised      PT LONG TERM GOAL #5   Title Pt will ambulate at least 1000 ft, indoors/outdoor surfaces independently (versus appropriate assistive device) for improved community gait.    Baseline mod I (increased time)    Time 4    Period Weeks    Status On-going      PT LONG TERM GOAL #6   Title Pt will verbalize understanding of local Parkinson's disease resources, including options for continue community fitness.    Baseline Initiated, needs reinforcement    Time 4    Period Weeks    Status On-going                 Plan - 05/13/20 1510    Clinical Impression Statement Today's skilled session focused on balance strategies on compliant surfaces and dynamic gait/cog dual tasking on outdoor paved and grass surfaces. With cognitive dual tasking - pt with tendency to demonstrate more slowed gait and decr L arm swing, esp when ambulating on unlevel surfaces such as grass, but no LOB. Will continue to progress towards LTGs.    Personal Factors and Comorbidities Comorbidity 3+;Time since onset of injury/illness/exacerbation;Other   Recent addition of Sinemet to medications    Comorbidities glaucoma, osteopenia, TB (as a child)    Examination-Activity Limitations Transfers;Locomotion Level;Stand    Examination-Participation Restrictions Church;Community Activity    Stability/Clinical Decision Making Evolving/Moderate complexity    Rehab Potential Good    PT Frequency 2x / week    PT Duration 4 weeks   per recert 03/26/3006   PT Treatment/Interventions ADLs/Self Care Home Management;Gait training;Functional mobility training;Stair training;Therapeutic activities;Therapeutic exercise;Balance training;Neuromuscular re-education;DME Instruction;Patient/family education    PT Next Visit Plan add additional balance exerciess for home - try compliant surfaces/SLS. continue PWR moves on compliant surfaces, try sit <> stands from lower surfaces.  Continue work on stepping/SLS and balance strategies, cognitive dual tasking, turning practice    Consulted and Agree with Plan of Care Patient           Patient will benefit from skilled therapeutic intervention in order to improve the following deficits and impairments:  Abnormal gait, Difficulty walking, Impaired tone, Decreased balance, Impaired flexibility, Decreased mobility, Decreased strength, Postural dysfunction  Visit Diagnosis: Other abnormalities of gait and mobility  Unsteadiness on feet  Muscle weakness (generalized)  Other symptoms and signs involving the nervous system  Problem List Patient Active Problem List   Diagnosis Date Noted  . Parkinson's disease ([place]) 02/17/2020  . Gait abnormality 12/04/2019  . Prediabetes 12/03/2018  . Vitamin D deficiency 12/03/2018  . Anemia due to vitamin B12 deficiency 12/03/2018  . Fatigue 12/03/2018  . Urinary frequency 10/01/2018  . Vitamin B12 deficiency 10/01/2018  . 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 Osteopenia 08/23/2012    Arliss Journey, PT,  DPT  05/13/2020, 3:12 PM  [facility] [address] [place], [place], [address] Phone: [phone]   Fax:  [phone]  Name: Denise [name] MRN: [id number] Date of Birth: [birth year]

## 2020-05-19 ENCOUNTER — Ambulatory Visit: Payer: 59 | Admitting: Occupational Therapy

## 2020-05-19 ENCOUNTER — Encounter: Payer: Self-pay | Admitting: Physical Therapy

## 2020-05-19 ENCOUNTER — Ambulatory Visit: Payer: 59 | Admitting: Physical Therapy

## 2020-05-19 ENCOUNTER — Other Ambulatory Visit: Payer: Self-pay

## 2020-05-19 ENCOUNTER — Encounter: Payer: Self-pay | Admitting: Occupational Therapy

## 2020-05-19 DIAGNOSIS — R2681 Unsteadiness on feet: Secondary | ICD-10-CM

## 2020-05-19 DIAGNOSIS — R29818 Other symptoms and signs involving the nervous system: Secondary | ICD-10-CM

## 2020-05-19 DIAGNOSIS — R2689 Other abnormalities of gait and mobility: Secondary | ICD-10-CM

## 2020-05-19 DIAGNOSIS — R293 Abnormal posture: Secondary | ICD-10-CM

## 2020-05-19 DIAGNOSIS — R278 Other lack of coordination: Secondary | ICD-10-CM

## 2020-05-19 NOTE — Patient Instructions (Addendum)
Ways to prevent future Parkinson's related complications:  1.   Exercise regularly.  Perform your therapy exercises and incorporate safe aerobic exercise when possible (swimming, stationary bike, arm bike, seated stepper)  2.   Focus on BIGGER movements during daily activities- really reach overhead, straighten elbows and extend fingers  3.   When dressing or reaching for your seatbelt make sure to use your body to assist by twisting and looking at where you are reaching while you reach--this can help to minimize stress on the shoulder and reduce the risk of a rotator cuff tear  4.   Anytime you reach or move shoulder, make sure you have good upright posture.  5.  When you reach for something overhead, make sure your thumb is facing up.  This is a better position for your shoulder.  6.  Swing your arms when you walk!  People with PD are at increased risk for frozen shoulder and swinging your arms can reduce this risk.  7.  Keep you feet apart when you are standing to allow you to have better balance and reach further (which can help with shoulder rigidity).  Also make sure your feet are apart when you are sitting before you stand up.        Online Resources for Power over Parkinson's Group November 2021  . Local [place] Online Groups  o Power over [employer] Group :   - Power Over Parkinson's Patient Education Group will be Wednesday, November 10th at 2pm via [place].   - Upcoming Power over Parkinson's Meetings:  2nd Wednesdays of the month at 2 pm:       December 8th, January 12th - Amy Marriott, PT at [facility] has resumed the lead of this group starting in July.  Contact Amy at [email or web address] if interested in participating in this online group o Parkinson's Care Partners Group:    3rd Mondays, Contact Corwin Levins o Atypical Parkinsonian Patient Group:   4th Wednesdays, Contact Corwin Levins o If you are interested in  participating in these online groups with Judson Roch, please contact her directly for how to join those meetings.  Her contact information is [email or web address].  She will send you a link to join the [employer].  (Please note that Corwin Levins , MSW, LCSW, has resigned her position at [facility], but will continue to lead the online groups temporarily)  . [place]:  [email or web address] o PD Health at Home continues:  Mindfulness Mondays, Expert Briefing Tuesdays, Wellness Wednesdays, Take Time Thursdays, Fitness Fridays  o Upcoming Webinar:  The Skinny on Skin and Bone Health in Parkinson's.  Wednesday, December 1st at 1 pm o Please check out their website to sign up for emails and see their full online offerings  . [place]:  [email or web address]  o Upcoming Webinar:   Steps Closer to Stopping Parkinson's:  2021 Research Review, Thursday, November 18th at 12 noon. o Check out additional information on their website to see their full online offerings  . [place]:  [email or web address] o Upcoming Webinar:  Non-Motor Symptom Medications in Parkinson's.  Wednesday, November 10th at 2 pm. o Care Partner Monthly Meetup.  With Robin Searing Phinney.  First Tuesday of each month, 2 pm o Check out additional information to Live Well Today on their website  . Parkinson and Movement Disorders (PMD) Alliance:  www.pmdalliance.org o [employer]Life Online:  Online Education Events o Sign up for emails, which are sent weekly to give you  updates on programming and online offerings  . Parkinson's Association of the [place]:  www.parkinsonassociation.org o Information on online support groups, online exercises including Yoga, Parkinson's exercises and more-LOTS of information on links to PD resources and online events o Virtual Support Group through Parkinson's Association of the [place]; next one is scheduled for Wednesday, June 03, 2020  at 2 pm. (These are typically scheduled for the 1st Wednesday of the month at 2 pm).  Visit website for details.  . Additional links for movement activities: o PWR! Moves Classes at [place] RESUMED, at a limited capacity.  We have several openings for Wednesday 10 am and 11 am classes.  Contact Amy Marriott, PT [email or web address] or [phone] if interested o Here is a link to the PWR!Moves classes on Zoom from [place] - Daily Mon-Sat at 10:00. Via Zoom, FREE and open to all.  There is also a link below via Facebook if you use that platform. - [email or web address].si - [email or web address]://www.[email or web address].no o Parkinson's Wellness Recovery (PWR! Moves)  www.pwr4life.org - Info on the PWR! Virtual Experience:  You will have access to our expertise through self-assessment, guided plans that start with the PD-specific fundamentals, educational content, tips, Q&A with an expert, and a growing Art therapist of PD-specific pre-recorded and live exercise classes of varying types and intensity - both physical and cognitive! If that is not enough, we offer 1:1 wellness consultations (in-person or virtual) to personalize your PWR! Research scientist (medical).  - Check out the PWR! Move of the month on the [place] [facility] website:  [email or web address] o [employer] Fridays:  - As part of the PD Health @ Home program, this free video series focuses each week on one aspect of fitness designed to support people living with Parkinson's.  -  HollywoodSale.dk o Dance for PD website is offering free, live-stream classes throughout the week, as well as links to [employer] of classes:   [email or web address].[email or web address]/ o Transport planner for Parkinson's Class:  Jones is back this Fall!  Free offering for people with Parkinson's and care partners; virtual class this Fall. The class will be Wednesdays 4-5pm beginning 10/13.  Classes will run for 9 weeks 10/13-12/15, with no class on 11/24.  Register below: o https://app.[email or web address]/danceprojectinc/[email or web address]?page=Enroll&meth=search&SEASON=Parkinsons+Dance-Fall+2021  o For more information, contact [phone] or email Ruffin Frederick at [email or web address] o Virtual dance and Pilates for Parkinson's classes: Click on the Community Tab> Parkinson's Movement Initiative Tab.  To register for classes and for more information, visit www.[email or web address].za and click the "community" tab.  o [employer] Parkinson's Cycling Classes  - [facility] [employer]: 1pm on Fridays-Live classes at [facility] [employer] at [email or web address] or ([phone]) [facility] [employer]: Virtual Classes Mondays and Thursdays (contact [place] at Rafael [email or web address] or [phone])  o [place] levels of classes are offered Tuesdays and Thursdays:  10:30 am,  12 noon & 1:45 pm at [facility]. To observe a class or for  more information, call [phone] or email [email or web address] . Well-Spring Solutions: o Chief Technology Officer Opportunities:  www.well-springsolutions.org/caregiver-education/caregiver-support-group.  You may also contact Vickki Muff at [email or web address] or [phone].   o Caregiver Virtual Event:   Well-Spring is Partnering with [employer], on Friday, November 19th from 11:30-12:30 for a virtual event - Contact 07-15-1998 (above) for details o Well-Spring Navigator:  Just1Navigator program, a free service to help individuals and families through the journey of determining care for older adults.  The "Navigator" is a  Vickki Muff, Education officer, museum,  who will speak with a prospective client and/or loved ones to provide an assessment of the situation and a set of recommendations for a personalized care plan -- all free of charge, and whether Well[employer]Spring [employer] offers the needed service or not. If the need is not a service we provide, we are well-connected with reputable programs in town that we can refer you to.  www.well-springsolutions.org or to speak with the Navigator, call [phone].

## 2020-05-19 NOTE — Therapy (Signed)
[facility] [address] [place], [place], [address] Phone: [phone]   Fax:  [phone]  Occupational Therapy Treatment  Patient Details  Name: Denise [name] MRN: [id number] Date of Birth: July 08, 1950 Referring Provider (OT): Dr. Jaynee Eagles   Encounter Date: 05/19/2020   OT End of Session - 05/19/20 1325    Visit Number 8    Number of Visits 15    Date for OT Re-Evaluation 05/08/20    Authorization Type [employer]    Authorization Time Period renewed 04/30/20    Authorization - Visit Number 8    Authorization - Number of Visits 30    OT Start Time 4481    OT Stop Time 1406    OT Time Calculation (min) 43 min    Activity Tolerance Patient tolerated treatment well    Behavior During Therapy  [facility] for tasks assessed/performed           Past Medical History:  Diagnosis Date  . Chronic kidney disease    as a child, no present issues   . Glaucoma   . Osteopenia 06/2018   T score -1.9 FRAX 3% / 0.2%  . 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 TUBAL LIGATION      There were no vitals filed for this visit.   Subjective Assessment - 05/19/20 1324    Subjective  Pt reports that she is trying to use large amplitude movement strategies at home.    Pertinent History Newly diagnosed PD    Limitations Pt drives from an hour away    Patient Stated Goals improve handwriting,    Currently in Pain? No/denies                                OT Education - 05/19/20 1427    Education Details PD [employer]; Ways to decr risk of future complications related to PD;  Reviewed supine PWR! moves (basic 4)--pt returned demo each; Supine>sitting transfer using large amplitude movement strategies--pt returned demo    Person(s) Educated Patient    Methods Explanation;Demonstration;Verbal cues;Handout    Comprehension Returned demonstration;Verbalized  understanding;Verbal cues required               OT Long Term Goals - 04/30/20 1106      OT LONG TERM GOAL #1   Title I with PD specific HEP    Time 5    Period Weeks    Status On-going   ongoing needs reinforcement 04/29/20- pt demonstrates understanding of inital     OT LONG TERM GOAL #2   Title Pt will verbalize understanding of adapted strategies to maximize safety and I with ADLs/ IADLs .    Time 5    Period Weeks    Status On-going   can benefit from continued reinforcement-04/30/20   Target Date 06/04/20      OT LONG TERM GOAL #3   Title Pt will verbalize understanding of ways to prevent future PD related complications and PD community resources.    Time 5    Period Weeks    Status On-going      OT LONG TERM GOAL #4   Title Pt will write a short paragraph with 100% legibility and no significant decrease in letter size    Time --    Period Weeks    Status Achieved  OT LONG TERM GOAL #5   Title Pt will demonstrate increased ease with dressing as eveidnced by decreasing PPT#4(don/ doff jacket) to 15 secs or less    Baseline 18.19 secs    Time 5    Period Weeks    Status On-going   04/27/20:  17.75sec     Long Term Additional Goals   Additional Long Term Goals Yes      OT LONG TERM GOAL #6   Title Pt will demonstrate ability to retrieve a lightweight object from overhead shelf with  -10 elbow extension with RUE    Baseline sh flex140, elbow ext -15    Time --    Period Weeks    Status Achieved   04/27/20:  145* with elbow ext WNL     OT LONG TERM GOAL #7   Title Pt will increase bilateral UE standing functional reach to 10 inches or greater to minimize fall risk and to increase I with ADLs.    Baseline RUE 8 in, LUE 9 inches    Time 5    Period Weeks    Status New                 Plan - 05/19/20 1325    Clinical Impression Statement Pt is progressing towards goals with improved understanding of PWR! supine and supine>sitting tranfer  strategies once reviewed.  However, pt would benefit from further review/reinforcement.    OT Occupational Profile and History Problem Focused Assessment - Including review of records relating to presenting problem    Occupational performance deficits (Please refer to evaluation for details): ADL's;IADL's;Work;Leisure;Social Participation    Body Structure / Function / Physical Skills ADL;Balance;Mobility;Strength;Flexibility;UE functional use;FMC;Gait;Coordination;Decreased knowledge of precautions;GMC;ROM;Decreased knowledge of use of DME;Dexterity;IADL    OT Frequency 2x / week   starting next week or 8 visits total   OT Duration 4 weeks    OT Treatment/Interventions Self-care/ADL training;Therapeutic exercise;Balance training;Functional Mobility Training;Manual Therapy;Neuromuscular education;Therapeutic activities;DME and/or AE instruction;Patient/family education;Passive range of motion;Moist Heat;Cognitive remediation/compensation;Energy conservation    Plan PD exercise chart; Consider PWR! modified quadraped, continue with big movements with ADLs (step and reach, ambulation with direction changes, picking up items from floor, etc).    Consulted and Agree with Plan of Care Patient           Patient will benefit from skilled therapeutic intervention in order to improve the following deficits and impairments:   Body Structure / Function / Physical Skills: ADL, Balance, Mobility, Strength, Flexibility, UE functional use, FMC, Gait, Coordination, Decreased knowledge of precautions, GMC, ROM, Decreased knowledge of use of DME, Dexterity, IADL       Visit Diagnosis: Other symptoms and signs involving the nervous system  Other lack of coordination  Abnormal posture  Unsteadiness on feet  Other abnormalities of gait and mobility    Problem List Patient Active Problem List   Diagnosis Date Noted  . Parkinson's disease ([place]) 02/17/2020  . Gait abnormality 12/04/2019  . Prediabetes  12/03/2018  . Vitamin D deficiency 12/03/2018  . Anemia due to vitamin B12 deficiency 12/03/2018  . Fatigue 12/03/2018  . Urinary frequency 10/01/2018  . Vitamin B12 deficiency 10/01/2018  . 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 Osteopenia 08/23/2012    [facility] 05/19/2020, 2:35 PM  [facility] [address] [place], [place], [address] Phone: [phone]   Fax:  [phone]  Name: Denise  [name] MRN: [id number] Date of Birth: [birth year]   Vianne Bulls, OTR/L [facility] [address] [place], [place]  [address] [phone] phone [phone] 05/19/20 2:35 PM

## 2020-05-19 NOTE — Patient Instructions (Signed)
Provided handout for corner balance exercises:  -Standing on pillow/towel feet apart:  EO head turns/head nods x 5; EC head turns/head nods x 5 -standing on pillow/towel feet together:  EO head turns/head nods x 5; EC head turns/head nods x 5

## 2020-05-19 NOTE — Therapy (Signed)
[facility] [address] [place], [place], [address] Phone: [phone]   Fax:  [phone]  Physical Therapy Treatment  Patient Details  Name: Denise [name] MRN: [id number] Date of Birth: [birth year] Referring Provider (PT): Dr. Jaynee Eagles   Encounter Date: 05/19/2020   PT End of Session - 05/19/20 1235    Visit Number 18    Number of Visits 23    Date for PT Re-Evaluation 81/01/75   60 day recert for 4-wk POC   Authorization Type [place]; VL 30 for PT    Authorization - Visit Number 18    Authorization - Number of Visits 30    PT Start Time 1025    PT Stop Time 1313    PT Time Calculation (min) 40 min    Equipment Utilized During Treatment Gait belt    Activity Tolerance Patient tolerated treatment well    Behavior During Therapy WFL for tasks assessed/performed           Past Medical History:  Diagnosis Date  . Chronic kidney disease    as a child, no present issues   . Glaucoma   . Osteopenia 06/2018   T score -1.9 FRAX 3% / 0.2%  . 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 TUBAL LIGATION      There were no vitals filed for this visit.   Subjective Assessment - 05/19/20 1234    Subjective No pain, no changes since last visit.  Would like to work on my balance.    Pertinent History glaucoma, osteopenia, TB (as a child)    Patient Stated Goals Pt's goals for therapy are to get stronger in legs and hands.    Currently in Pain? No/denies                             [facility] Adult PT Treatment/Exercise - 05/19/20 0001      Ambulation/Gait   Ambulation/Gait Yes    Ambulation/Gait Assistance 5: Supervision    Ambulation Distance (Feet) 115 Feet   230; 60 ft x 2   Assistive device None    Gait Pattern Step-through pattern;Decreased arm swing - left;Decreased step length - right;Decreased step length - left;Right foot flat;Left foot  flat;Decreased trunk rotation;Trunk flexed;Narrow base of support;Poor foot clearance - left;Poor foot clearance - right    Ambulation Surface Level;Indoor    Gait Comments Cues for increased arm swing, increased step length.  Performed with quick stop/start cues and change of directions (foward/back walking)               Balance Exercises - 05/19/20 0001      Balance Exercises: Standing   Standing Eyes Opened Wide (BOA);Foam/compliant surface;Head turns;Limitations;Narrow base of support (BOS)    Standing Eyes Opened Limitations Head turns/nods x 5 reps each 1 UE support at chair    Standing Eyes Closed Foam/compliant surface;Wide (BOA);Narrow base of support (BOS);Limitations    Standing Eyes Closed Limitations Head turns/head nods x 5 reps each with UE support at chair    Tandem Stance Eyes open;Intermittent upper extremity support;1 rep;30 secs;Foam/compliant surface    SLS with Vectors Foam/compliant surface;Upper extremity assist 2;Limitations    SLS with Vectors Limitations Alt step taps to 12" step, standing on Airex.  BUE support x 10>1 UE support x 10 reps.    Standing, One Foot on a Step Eyes  open;Limitations    Standing, One Foot on a Step Limitations Standing with one foot propped for SLS:  alt UE raises x 10 reps, then head turns x 5; head nods x 5.  Min guard and cues for upright posture through SLS leg    Wall Bumps Hip;Eyes opened;Limitations    Wall Bumps Limitations on 2 pillows, x10 reps eyes open    Marching Foam/compliant surface;Upper extremity assist 2;Upper extremity assist 1;Static;10 reps;Limitations    Marching Limitations Performed 2 sets x 10 reps on pillows with UE support at chair in front; then standing beside counter on Airex x 10 reps    Heel Raises Both;10 reps;Limitations    Heel Raises Limitations 2 sets; on pillows with chair UE support    Toe Raise Both;10 reps;Limitations    Toe Raise Limitations 2 sets, on pillows, chair UE support           Standing on Airex:  Forward step and weigthshift x 10, forward<>back step and weightshift x 10 (cues for increased step length); facing counter on Airex:  Back step and weightshift x 10, side step and weightshift x 10.   PT Education - 05/19/20 1510    Education Details Updates to HEP to include corner balance exercises    Person(s) Educated Patient    Methods Explanation;Demonstration;Handout    Comprehension Verbalized understanding;Returned demonstration;Verbal cues required               PT Long Term Goals - 05/01/20 1402      PT LONG TERM GOAL #1   Title Pt will be independent with HEP for improved strength, balance, transfers, and gait.  TARGET 05/29/2020    Baseline Cues at times needed for PWR! Moves Flow (most recent update to HEP)    Time 4    Period Weeks    Status On-going      PT LONG TERM GOAL #2   Title Pt will perform 8 of 10 reps of sit<>stand from <18" surface with minimal to no UE support and no LOB, for improved functional strength and transfer efficiency.    Baseline able to perform, but with extra time    Time 4    Period Weeks    Status On-going      PT LONG TERM GOAL #3   Title Pt will improve TUG cognitive score to less than or equal to 15 seconds for decreased fall risk.    Baseline 19.94 seconds on 04/27/20    Time 4    Period Weeks    Status On-going      PT LONG TERM GOAL #4   Title Pt will improve MiniBESTest score to at least 23/28 for decreased fall risk.    Baseline 14/28 at eval, 20/28 on 04/27/20    Time 4    Period Weeks    Status Revised      PT LONG TERM GOAL #5   Title Pt will ambulate at least 1000 ft, indoors/outdoor surfaces independently (versus appropriate assistive device) for improved community gait.    Baseline mod I (increased time)    Time 4    Period Weeks    Status On-going      PT LONG TERM GOAL #6   Title Pt will verbalize understanding of local Parkinson's disease resources, including options for continue  community fitness.    Baseline Initiated, needs reinforcement    Time 4    Period Weeks    Status On-going  Plan - 05/19/20 1511    Clinical Impression Statement Focus of today's skilled PT session on compliant surface balance work and SLS.  Added corner balance exercises to HEP.  On compliant surfaces, pt tends to require at least 1 UE support.  She will continue to benefit from skilled PT to work towards [place].    Personal Factors and Comorbidities Comorbidity 3+;Time since onset of injury/illness/exacerbation;Other   Recent addition of Sinemet to medications   Comorbidities glaucoma, osteopenia, TB (as a child)    Examination-Activity Limitations Transfers;Locomotion Level;Stand    Examination-Participation Restrictions Church;Community Activity    Stability/Clinical Decision Making Evolving/Moderate complexity    Rehab Potential Good    PT Frequency 2x / week    PT Duration 4 weeks   per recert 59/93/5701   PT Treatment/Interventions ADLs/Self Care Home Management;Gait training;Functional mobility training;Stair training;Therapeutic activities;Therapeutic exercise;Balance training;Neuromuscular re-education;DME Instruction;Patient/family education    PT Next Visit Plan Review HEP for corner balance exercises and add to if needed; continue to work on SLS on compliant surfaces, PWR! Moves on compliant surfaces, balance strategies; turns, sit<>stand from lower surfaces   05/19/20:  Need to monitor her POC/goals, as this is wk 3 of 4 in POC and she is scheduled much farther out   Consulted and Agree with Plan of Care Patient           Patient will benefit from skilled therapeutic intervention in order to improve the following deficits and impairments:  Abnormal gait, Difficulty walking, Impaired tone, Decreased balance, Impaired flexibility, Decreased mobility, Decreased strength, Postural dysfunction  Visit Diagnosis: Unsteadiness on feet  Other abnormalities of  gait and mobility     Problem List Patient Active Problem List   Diagnosis Date Noted  . Parkinson's disease ([place]) 02/17/2020  . Gait abnormality 12/04/2019  . Prediabetes 12/03/2018  . Vitamin D deficiency 12/03/2018  . Anemia due to vitamin B12 deficiency 12/03/2018  . Fatigue 12/03/2018  . Urinary frequency 10/01/2018  . Vitamin B12 deficiency 10/01/2018  . 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 Osteopenia 08/23/2012    Eunie Lawn W. 05/19/2020, 3:16 PM  Frazier Butt., PT   [facility] [address] [place], [place], [address] Phone: [phone]   Fax:  [phone]  Name: Denise [name] MRN: [id number] Date of Birth: 11-29-1950

## 2020-05-21 ENCOUNTER — Encounter: Payer: Self-pay | Admitting: Occupational Therapy

## 2020-05-21 ENCOUNTER — Ambulatory Visit: Payer: 59 | Admitting: Occupational Therapy

## 2020-05-21 ENCOUNTER — Other Ambulatory Visit: Payer: Self-pay

## 2020-05-21 DIAGNOSIS — R2681 Unsteadiness on feet: Secondary | ICD-10-CM

## 2020-05-21 DIAGNOSIS — R2689 Other abnormalities of gait and mobility: Secondary | ICD-10-CM | POA: Diagnosis not present

## 2020-05-21 DIAGNOSIS — R278 Other lack of coordination: Secondary | ICD-10-CM

## 2020-05-21 DIAGNOSIS — R29818 Other symptoms and signs involving the nervous system: Secondary | ICD-10-CM

## 2020-05-21 DIAGNOSIS — R293 Abnormal posture: Secondary | ICD-10-CM

## 2020-05-21 NOTE — Patient Instructions (Addendum)
(  Exercise) Monday Tuesday Wednesday Thursday Friday Saturday Sunday   PWR! standing           PWR! sitting           PWR! Lying on back           PWR! With hands on table           PWR! hands           Coordination

## 2020-05-21 NOTE — Therapy (Signed)
[facility] [address] [place], [place], [address] Phone: [phone]   Fax:  [phone]  Occupational Therapy Treatment  Patient Details  Name: Denise [name] MRN: [id number] Date of Birth: Oct 23, 1950 Referring Provider (OT): Dr. Jaynee Eagles   Encounter Date: 05/21/2020   OT End of Session - 05/21/20 1320    Visit Number 9    Number of Visits 15    Date for OT Re-Evaluation 05/08/20    Authorization Type [employer]    Authorization Time Period renewed 04/30/20    Authorization - Visit Number 9    Authorization - Number of Visits 30    OT Start Time 6962    OT Stop Time 1405    OT Time Calculation (min) 43 min    Activity Tolerance Patient tolerated treatment well    Behavior During Therapy [facility] for tasks assessed/performed           Past Medical History:  Diagnosis Date  . Chronic kidney disease    as a child, no present issues   . Glaucoma   . Osteopenia 06/2018   T score -1.9 FRAX 3% / 0.2%  . 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 TUBAL LIGATION      There were no vitals filed for this visit.   Subjective Assessment - 05/21/20 1320    Subjective  This is really helpful and makes me feel better    Pertinent History Newly diagnosed PD    Limitations Pt drives from an hour away    Patient Stated Goals improve handwriting,    Currently in Pain? No/denies             PWR! Moves in supine (basic 4) x10-20 each with min cueing for incr movement amplitude  PWR! Moves in modified quadruped (basic 4) x10-20 each with min cueing for incr movement amplitude.    Reviewed large amplitude movement strategies to pick up objects from floor and practiced with various objects with improvement noted after repetition and initial cueing.         OT Education - 05/21/20 1412    Education Details PD Exercise Chart and Ways to Use; Using Exercise for Brain  Change/Exercise as Medicine    Person(s) Educated Patient    Methods Explanation;Handout    Comprehension Verbalized understanding               OT Long Term Goals - 04/30/20 1106      OT LONG TERM GOAL #1   Title I with PD specific HEP    Time 5    Period Weeks    Status On-going   ongoing needs reinforcement 04/29/20- pt demonstrates understanding of inital     OT LONG TERM GOAL #2   Title Pt will verbalize understanding of adapted strategies to maximize safety and I with ADLs/ IADLs .    Time 5    Period Weeks    Status On-going   can benefit from continued reinforcement-04/30/20   Target Date 06/04/20      OT LONG TERM GOAL #3   Title Pt will verbalize understanding of ways to prevent future PD related complications and PD community resources.    Time 5    Period Weeks    Status On-going      OT LONG TERM GOAL #4   Title Pt will write a short paragraph with 100% legibility and no significant decrease  in letter size    Time --    Period Weeks    Status Achieved      OT LONG TERM GOAL #5   Title Pt will demonstrate increased ease with dressing as eveidnced by decreasing PPT#4(don/ doff jacket) to 15 secs or less    Baseline 18.19 secs    Time 5    Period Weeks    Status On-going   04/27/20:  17.75sec     Long Term Additional Goals   Additional Long Term Goals Yes      OT LONG TERM GOAL #6   Title Pt will demonstrate ability to retrieve a lightweight object from overhead shelf with  -10 elbow extension with RUE    Baseline sh flex140, elbow ext -15    Time --    Period Weeks    Status Achieved   04/27/20:  145* with elbow ext WNL     OT LONG TERM GOAL #7   Title Pt will increase bilateral UE standing functional reach to 10 inches or greater to minimize fall risk and to increase I with ADLs.    Baseline RUE 8 in, LUE 9 inches    Time 5    Period Weeks    Status New                 Plan - 05/21/20 1322    Clinical Impression Statement Pt is  progressing towards goals with improving movement amplitude and incr carryover with larger amplitude movements for functional tasks.    OT Occupational Profile and History Problem Focused Assessment - Including review of records relating to presenting problem    Occupational performance deficits (Please refer to evaluation for details): ADL's;IADL's;Work;Leisure;Social Participation    Body Structure / Function / Physical Skills ADL;Balance;Mobility;Strength;Flexibility;UE functional use;FMC;Gait;Coordination;Decreased knowledge of precautions;GMC;ROM;Decreased knowledge of use of DME;Dexterity;IADL    OT Frequency 2x / week   starting next week or 8 visits total   OT Duration 4 weeks    OT Treatment/Interventions Self-care/ADL training;Therapeutic exercise;Balance training;Functional Mobility Training;Manual Therapy;Neuromuscular education;Therapeutic activities;DME and/or AE instruction;Patient/family education;Passive range of motion;Moist Heat;Cognitive remediation/compensation;Energy conservation    Plan Progress Note; continue with big movements with ADLs (step and reach, ambulation with direction changes, etc), Requested pt to bring in HEP handouts/folder for review prn    Consulted and Agree with Plan of Care Patient           Patient will benefit from skilled therapeutic intervention in order to improve the following deficits and impairments:   Body Structure / Function / Physical Skills: ADL, Balance, Mobility, Strength, Flexibility, UE functional use, FMC, Gait, Coordination, Decreased knowledge of precautions, GMC, ROM, Decreased knowledge of use of DME, Dexterity, IADL       Visit Diagnosis: Other symptoms and signs involving the nervous system  Other lack of coordination  Abnormal posture  Unsteadiness on feet  Other abnormalities of gait and mobility    Problem List Patient Active Problem List   Diagnosis Date Noted  . Parkinson's disease ([place]) 02/17/2020  . Gait  abnormality 12/04/2019  . Prediabetes 12/03/2018  . Vitamin D deficiency 12/03/2018  . Anemia due to vitamin B12 deficiency 12/03/2018  . Fatigue 12/03/2018  . Urinary frequency 10/01/2018  . Vitamin B12 deficiency 10/01/2018  . 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 Osteopenia 08/23/2012    [facility] 05/21/2020, 2:19 PM  [facility] [phone]  [place] [address], [place], [address] Phone: [phone]   Fax:  [phone]  Name: Denise [name] MRN: [id number] Date of Birth: Nov 06, 1950   Vianne Bulls, OTR/L [facility] [address]. [place], [place]  [address] [phone] phone [phone] 05/21/20 2:19 PM

## 2020-05-25 ENCOUNTER — Ambulatory Visit: Payer: 59 | Admitting: Occupational Therapy

## 2020-05-25 ENCOUNTER — Ambulatory Visit: Payer: 59 | Admitting: Physical Therapy

## 2020-05-26 ENCOUNTER — Ambulatory Visit: Payer: 59 | Admitting: Occupational Therapy

## 2020-05-26 ENCOUNTER — Other Ambulatory Visit: Payer: Self-pay

## 2020-05-26 ENCOUNTER — Encounter: Payer: Self-pay | Admitting: Occupational Therapy

## 2020-05-26 ENCOUNTER — Ambulatory Visit: Payer: 59 | Admitting: Physical Therapy

## 2020-05-26 DIAGNOSIS — R2689 Other abnormalities of gait and mobility: Secondary | ICD-10-CM

## 2020-05-26 DIAGNOSIS — R293 Abnormal posture: Secondary | ICD-10-CM

## 2020-05-26 DIAGNOSIS — R2681 Unsteadiness on feet: Secondary | ICD-10-CM

## 2020-05-26 DIAGNOSIS — R278 Other lack of coordination: Secondary | ICD-10-CM

## 2020-05-26 DIAGNOSIS — R29818 Other symptoms and signs involving the nervous system: Secondary | ICD-10-CM

## 2020-05-26 NOTE — Patient Instructions (Signed)
Sunday Monday Tuesday Wednesday Thursday Friday Saturday  Sunday   PWR! standing           PWR! sitting           PWR! Lying on back           PWR! With hands on table           PWR! hands           Coordination           Balance (Pillow & Stepping)           Posture           Walking

## 2020-05-26 NOTE — Therapy (Signed)
[facility] [address] [place], [place], [address] Phone: [phone]   Fax:  [phone]  Physical Therapy Treatment/ReCert  Patient Details  Name: Denise [name] MRN: [id number] Date of Birth: 1950-08-28 Referring Provider (PT): Dr. Jaynee Eagles   Encounter Date: 05/26/2020   PT End of Session - 05/26/20 1239    Visit Number 19    Number of Visits 23    Date for PT Re-Evaluation 14/43/15   recert for additional 2x week for 2 weeks   Authorization Type [place]; VL 30 for PT    Authorization - Visit Number 19    Authorization - Number of Visits 30    PT Start Time 1150    PT Stop Time 1232   3 mins non billable due to pt using restroom   PT Time Calculation (min) 42 min    Equipment Utilized During Treatment Gait belt    Activity Tolerance Patient tolerated treatment well    Behavior During Therapy WFL for tasks assessed/performed           Past Medical History:  Diagnosis Date  . Chronic kidney disease    as a child, no present issues   . Glaucoma   . Osteopenia 06/2018   T score -1.9 FRAX 3% / 0.2%  . 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 TUBAL LIGATION      There were no vitals filed for this visit.   Subjective Assessment - 05/26/20 1152    Subjective No changes. Got off schedule with her medication over the weekend, but is now back on schedule.    Pertinent History glaucoma, osteopenia, TB (as a child)    Patient Stated Goals Pt's goals for therapy are to get stronger in legs and hands.    Currently in Pain? No/denies              [facility] PT Assessment - 05/26/20 0001      Assessment   Medical Diagnosis Parkinson's disease    Referring Provider (PT) Dr. Jaynee Eagles      Prior Function   Level of Independence Independent                              Balance Exercises - 05/26/20 0001      Balance Exercises: Standing    Standing Eyes Closed Foam/compliant surface;Wide (BOA);Narrow base of support (BOS);Limitations    Standing Eyes Closed Limitations on blue air ex: wider BOS 2 x 30 seconds, with feet together 2 x 30 seconds with intermittent taps to chsir for balance    Wall Bumps Hip;Eyes opened;Limitations    Wall Bumps Limitations on blue air ex: x10 reps eyes open, 2 x 5 reps eyes closeed    Tandem Gait Forward;Intermittent upper extremity support;Limitations    Tandem Gait Limitations at countertop down and back on blue foam beam, intermittent UE support    Heel Raises Both;10 reps;Limitations    Heel Raises Limitations on blue air ex, no UE support    Toe Raise Both;10 reps;Limitations    Toe Raise Limitations on blue air ex, no UE support    Other Standing Exercises on blue foam at counter: standing PWR rock x12 reps B with verbal cues for technique, intermittent taps to counter for balance, weight shift laterally and raising leg up for dynamic SLS - intermittent UE support x10 reps B  Other Standing Exercises Comments SLS stepping on 6 stepping stones down and back at countertop x4 reps - beginning with single UE support and progressing to none, cues for incr step length, min guard/min A at times for balance          Reviewed new additions to HEP: -Standing on pillow/towel feet apart:  EO head turns/head nods x 5; EC head turns/head nods x 5 -standing on pillow/towel feet together:  EO head turns/head nods x 5; EC head turns/head nods x 5    PT Education - 05/26/20 1241    Education Details reviewed corner balance, pt asking about if she should be focusing on the speed of her movement, discussed with pt that focus is on big amplitude/bigger movements for larger movement patterns vs. focus on speed which could result in smaller steps/smaller movements.    Person(s) Educated Patient    Methods Explanation;Demonstration    Comprehension Verbalized understanding;Returned demonstration             PT Short Term Goals - 05/26/20 1248      PT SHORT TERM GOAL #1   Title ALL STGS = LTGS             PT Long Term Goals - 05/01/20 1402      PT LONG TERM GOAL #1   Title Pt will be independent with HEP for improved strength, balance, transfers, and gait.  TARGET 05/29/2020    Baseline Cues at times needed for PWR! Moves Flow (most recent update to HEP)    Time 4    Period Weeks    Status On-going      PT LONG TERM GOAL #2   Title Pt will perform 8 of 10 reps of sit<>stand from <18" surface with minimal to no UE support and no LOB, for improved functional strength and transfer efficiency.    Baseline able to perform, but with extra time    Time 4    Period Weeks    Status On-going      PT LONG TERM GOAL #3   Title Pt will improve TUG cognitive score to less than or equal to 15 seconds for decreased fall risk.    Baseline 19.94 seconds on 04/27/20    Time 4    Period Weeks    Status On-going      PT LONG TERM GOAL #4   Title Pt will improve MiniBESTest score to at least 23/28 for decreased fall risk.    Baseline 14/28 at eval, 20/28 on 04/27/20    Time 4    Period Weeks    Status Revised      PT LONG TERM GOAL #5   Title Pt will ambulate at least 1000 ft, indoors/outdoor surfaces independently (versus appropriate assistive device) for improved community gait.    Baseline mod I (increased time)    Time 4    Period Weeks    Status On-going      PT LONG TERM GOAL #6   Title Pt will verbalize understanding of local Parkinson's disease resources, including options for continue community fitness.    Baseline Initiated, needs reinforcement    Time 4    Period Weeks    Status On-going           Ongoing LTGs for re-cert: (due to original frequency not met - pt supposed to be seen 2x week for 4 weeks, pt has only been seen 1x week due to scheduling)  PT Long Term Goals -  05/26/20 1251      PT LONG TERM GOAL #1   Title Pt will be independent with HEP for improved  strength, balance, transfers, and gait.  TARGET 06/11/20    Baseline Cues at times needed for PWR! Moves Flow (most recent update to HEP)    Time 2    Period Weeks    Status On-going    Target Date 06/11/20      PT LONG TERM GOAL #2   Title Pt will perform 8 of 10 reps of sit<>stand from <18" surface with minimal to no UE support and no LOB, for improved functional strength and transfer efficiency.    Baseline able to perform, but with extra time    Time 2    Period Weeks    Status On-going      PT LONG TERM GOAL #3   Title Pt will improve TUG cognitive score to less than or equal to 15 seconds for decreased fall risk.    Baseline 19.94 seconds on 04/27/20    Time 2    Period Weeks    Status On-going      PT LONG TERM GOAL #4   Title Pt will improve MiniBESTest score to at least 23/28 for decreased fall risk.    Baseline 14/28 at eval, 20/28 on 04/27/20    Time 2    Period Weeks    Status Revised      PT LONG TERM GOAL #5   Title Pt will ambulate at least 1000 ft, indoors/outdoor surfaces independently (versus appropriate assistive device) for improved community gait.    Baseline mod I (increased time)    Time 2    Period Weeks    Status On-going      PT LONG TERM GOAL #6   Title Pt will verbalize understanding of local Parkinson's disease resources, including options for continue community fitness.    Baseline Initiated, needs reinforcement    Time 2    Period Weeks    Status On-going                Plan - 05/26/20 1246    Clinical Impression Statement Performing re-cert today for additional 2x week for 2 weeks due to pt not meeting original frequency of 2x week for 4 weeks (pt only seen for 1x/week). All LTGs are on-going. Today's session continued to focus on standing balance strategies on compliant surfaces. Pt able to decr UE support today with eyes closed balance and with SLS activities. Will continue to progress towards LTGs.    Personal Factors and  Comorbidities Comorbidity 3+;Time since onset of injury/illness/exacerbation;Other   Recent addition of Sinemet to medications   Comorbidities glaucoma, osteopenia, TB (as a child)    Examination-Activity Limitations Transfers;Locomotion Level;Stand    Examination-Participation Restrictions Church;Community Activity    Stability/Clinical Decision Making Evolving/Moderate complexity    Rehab Potential Good    PT Frequency 2x / week    PT Duration 2 weeks   \   PT Treatment/Interventions ADLs/Self Care Home Management;Gait training;Functional mobility training;Stair training;Therapeutic activities;Therapeutic exercise;Balance training;Neuromuscular re-education;DME Instruction;Patient/family education    PT Next Visit Plan continue to work on SLS on compliant surfaces, PWR! Moves on compliant surfaces, balance strategies; turns, sit<>stand from lower surfaces    Consulted and Agree with Plan of Care Patient           Patient will benefit from skilled therapeutic intervention in order to improve the following deficits and impairments:  Abnormal gait, Difficulty walking,  Impaired tone, Decreased balance, Impaired flexibility, Decreased mobility, Decreased strength, Postural dysfunction  Visit Diagnosis: Other symptoms and signs involving the nervous system  Abnormal posture  Unsteadiness on feet  Other abnormalities of gait and mobility  Other lack of coordination     Problem List Patient Active Problem List   Diagnosis Date Noted  . Parkinson's disease ([place]) 02/17/2020  . Gait abnormality 12/04/2019  . Prediabetes 12/03/2018  . Vitamin D deficiency 12/03/2018  . Anemia due to vitamin B12 deficiency 12/03/2018  . Fatigue 12/03/2018  . Urinary frequency 10/01/2018  . Vitamin B12 deficiency 10/01/2018  . 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 Osteopenia 08/23/2012    Arliss Journey, PT, DPT  05/26/2020, 12:50 PM  [facility] [address] [place], [place], [address] Phone: [phone]   Fax:  [phone]  Name: Erilyn Pearman MRN: [id number] Date of Birth: 1951/01/25

## 2020-05-26 NOTE — Therapy (Signed)
[facility] [address] [place], [place], [address] Phone: [phone]   Fax:  [phone]  Occupational Therapy Treatment  Patient Details  Name: Denise [name] MRN: [id number] Date of Birth: May 28, 1951 Referring Provider (OT): Dr. Jaynee Eagles   Encounter Date: 05/26/2020   OT End of Session - 05/26/20 1411    Visit Number 10    Number of Visits 15    Date for OT Re-Evaluation 05/08/20    Authorization Type [employer]--30 visit limit combined (OT/PT count as 1 if on same day)    Authorization Time Period renewed 04/30/20    Authorization - Visit Number 10    Authorization - Number of Visits 30    OT Start Time 1232    OT Stop Time 1315    OT Time Calculation (min) 43 min    Activity Tolerance Patient tolerated treatment well    Behavior During Therapy [facility] for tasks assessed/performed           Past Medical History:  Diagnosis Date  . Chronic kidney disease    as a child, no present issues   . Glaucoma   . Osteopenia 06/2018   T score -1.9 FRAX 3% / 0.2%  . 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 TUBAL LIGATION      There were no vitals filed for this visit.   Subjective Assessment - 05/26/20 1327    Subjective  Pt reports that strategies for picking things up off floor really help    Pertinent History Newly diagnosed PD    Limitations Pt drives from an hour away    Patient Stated Goals improve handwriting,    Currently in Pain? No/denies             Reviewed PWR! Moves (basic 4) in supine and modified quadruped.  Pt returned demo each 10-20x with min-mod cueing for large amplitude movements and sequencing.  Made additional notes on handouts for cueing at home.  Practiced functional mobility for kitchen tasks using large amplitude movement strategies including:  Getting items in out of oven, carrying pan from counter>table with direction changes,  opening fridge and reaching in fridge.  Pt verbalized understanding of strategies for large amplitude movements and returned demo with min cueing.     OT Education - 05/26/20 1410    Education Details Organized and Updated PD Exercise Chart and issued multiple copies (as pt brought in all handouts) and gave pt sample schedule    Person(s) Educated Patient    Methods Explanation;Handout    Comprehension Verbalized understanding               OT Long Term Goals - 05/26/20 1423      OT LONG TERM GOAL #1   Title I with PD specific HEP    Time 5    Period Weeks    Status On-going   05/26/20:  needs reinforcement of updates to HEP     OT LONG TERM GOAL #2   Title Pt will verbalize understanding of adapted strategies to maximize safety and I with ADLs/ IADLs .    Time 5    Period Weeks    Status On-going   can benefit from continued reinforcement-04/30/20     OT LONG TERM GOAL #3   Title Pt will verbalize understanding of ways to prevent future PD related complications and PD community resources.    Time 5  Period Weeks    Status Achieved   05/26/20     OT LONG TERM GOAL #4   Title Pt will write a short paragraph with 100% legibility and no significant decrease in letter size    Period Weeks    Status Achieved      OT LONG TERM GOAL #5   Title Pt will demonstrate increased ease with dressing as eveidnced by decreasing PPT#4(don/ doff jacket) to 15 secs or less    Baseline 18.19 secs    Time 5    Period Weeks    Status On-going   04/27/20:  17.75sec     OT LONG TERM GOAL #6   Title Pt will demonstrate ability to retrieve a lightweight object from overhead shelf with  -10 elbow extension with RUE    Baseline sh flex140, elbow ext -15    Period Weeks    Status Achieved   04/27/20:  145* with elbow ext WNL     OT LONG TERM GOAL #7   Title Pt will increase bilateral UE standing functional reach to 10 inches or greater to minimize fall risk and to increase I with ADLs.      Baseline RUE 8 in, LUE 9 inches    Time 5    Period Weeks    Status On-going                 Plan - 05/26/20 1417    Clinical Impression Statement Progress Note Reporting Period 03/13/20-05/26/20:  Pt is progressing towards goals with improving movement amplitude and understanding of large amplitude movement strategies.  However, pt will benefit from continued reinforcement and review to incr carryover into ADLs/IADLs and decr risk for future complications.    OT Occupational Profile and History Problem Focused Assessment - Including review of records relating to presenting problem    Occupational performance deficits (Please refer to evaluation for details): ADL's;IADL's;Work;Leisure;Social Participation    Body Structure / Function / Physical Skills ADL;Balance;Mobility;Strength;Flexibility;UE functional use;FMC;Gait;Coordination;Decreased knowledge of precautions;GMC;ROM;Decreased knowledge of use of DME;Dexterity;IADL    OT Frequency 2x / week   starting next week or 8 visits total   OT Duration 4 weeks    OT Treatment/Interventions Self-care/ADL training;Therapeutic exercise;Balance training;Functional Mobility Training;Manual Therapy;Neuromuscular education;Therapeutic activities;DME and/or AE instruction;Patient/family education;Passive range of motion;Moist Heat;Cognitive remediation/compensation;Energy conservation    Plan continue with big movements with ADLs/IADLs (practice strategies), step and reach with large amplitude movements    Consulted and Agree with Plan of Care Patient           Patient will benefit from skilled therapeutic intervention in order to improve the following deficits and impairments:   Body Structure / Function / Physical Skills: ADL, Balance, Mobility, Strength, Flexibility, UE functional use, FMC, Gait, Coordination, Decreased knowledge of precautions, GMC, ROM, Decreased knowledge of use of DME, Dexterity, IADL       Visit Diagnosis: Other  symptoms and signs involving the nervous system  Other lack of coordination  Abnormal posture  Unsteadiness on feet  Other abnormalities of gait and mobility    Problem List Patient Active Problem List   Diagnosis Date Noted  . Parkinson's disease ([place]) 02/17/2020  . Gait abnormality 12/04/2019  . Prediabetes 12/03/2018  . Vitamin D deficiency 12/03/2018  . Anemia due to vitamin B12 deficiency 12/03/2018  . Fatigue 12/03/2018  . Urinary frequency 10/01/2018  . Vitamin B12 deficiency 10/01/2018  . 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 Abnormal auditory perception  of both ears 05/10/2016  . Osteopenia 08/23/2012    [facility] 05/26/2020, 2:25 PM  [facility] [address] [place], [place], [address] Phone: [phone]   Fax:  [phone]  Name: Denise [name] MRN: [id number] Date of Birth: May 15, 1951   Vianne Bulls, OTR/L [facility] [address] [place], [place]  [address] [phone] phone [phone] 05/26/20 2:25 PM

## 2020-06-01 ENCOUNTER — Ambulatory Visit: Payer: 59 | Admitting: Physical Therapy

## 2020-06-01 ENCOUNTER — Ambulatory Visit: Payer: 59 | Admitting: Occupational Therapy

## 2020-06-04 ENCOUNTER — Other Ambulatory Visit: Payer: Self-pay | Admitting: Nurse Practitioner

## 2020-06-04 ENCOUNTER — Ambulatory Visit: Payer: 59 | Attending: Neurology | Admitting: Occupational Therapy

## 2020-06-04 ENCOUNTER — Other Ambulatory Visit: Payer: Self-pay

## 2020-06-04 ENCOUNTER — Other Ambulatory Visit: Payer: Self-pay | Admitting: Neurology

## 2020-06-04 ENCOUNTER — Encounter: Payer: Self-pay | Admitting: Occupational Therapy

## 2020-06-04 ENCOUNTER — Ambulatory Visit: Payer: 59 | Admitting: Physical Therapy

## 2020-06-04 DIAGNOSIS — R2689 Other abnormalities of gait and mobility: Secondary | ICD-10-CM | POA: Diagnosis present

## 2020-06-04 DIAGNOSIS — R293 Abnormal posture: Secondary | ICD-10-CM | POA: Diagnosis present

## 2020-06-04 DIAGNOSIS — R278 Other lack of coordination: Secondary | ICD-10-CM

## 2020-06-04 DIAGNOSIS — R29818 Other symptoms and signs involving the nervous system: Secondary | ICD-10-CM

## 2020-06-04 DIAGNOSIS — R2681 Unsteadiness on feet: Secondary | ICD-10-CM | POA: Insufficient documentation

## 2020-06-04 DIAGNOSIS — I2510 Atherosclerotic heart disease of native coronary artery without angina pectoris: Secondary | ICD-10-CM

## 2020-06-04 NOTE — Therapy (Signed)
[facility] [address] [place], [place], [address] Phone: [phone]   Fax:  [phone]  Physical Therapy Treatment  Patient Details  Name: Denise [name] MRN: [id number] Date of Birth: 04/19/1951 Referring Provider (PT): Dr. Jaynee Eagles   Encounter Date: 06/04/2020   PT End of Session - 06/04/20 2100    Visit Number 20    Number of Visits 23    Date for PT Re-Evaluation 22/48/25   recert for additional 2x week for 2 weeks   Authorization Type [place]; VL 30 for PT    Authorization - Visit Number 23   Amended 06/04/2020 to include OT visits   Authorization - Number of Visits 30    PT Start Time 0037    PT Stop Time 1316    PT Time Calculation (min) 41 min    Equipment Utilized During Treatment Gait belt    Activity Tolerance Patient tolerated treatment well    Behavior During Therapy [facility] for tasks assessed/performed           Past Medical History:  Diagnosis Date  . Chronic kidney disease    as a child, no present issues   . Glaucoma   . Osteopenia 06/2018   T score -1.9 FRAX 3% / 0.2%  . 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 TUBAL LIGATION      There were no vitals filed for this visit.   Subjective Assessment - 06/04/20 1237    Subjective Went to see neurologist (at [facility]) yesterday; no major changes.  She wants me to keep exercising.    Pertinent History glaucoma, osteopenia, TB (as a child)    Patient Stated Goals Pt's goals for therapy are to get stronger in legs and hands.    Currently in Pain? No/denies                             [facility] Adult PT Treatment/Exercise - 06/04/20 1246      Transfers   Transfers Sit to Stand;Stand to Sit    Sit to Stand 6: Modified independent (Device/Increase time);Without upper extremity assist;From bed;From chair/3-in-1    Stand to Sit 6: Modified independent (Device/Increase  time);Without upper extremity assist;To bed;To chair/3-in-1    Number of Reps 10 reps;Other sets (comment);Other reps (comment)    Comments 10 reps from mat height, then 5 reps from 16" chair height (no armrests), with PT providing initial cues for increased forward lean (including use of forward arm position to increase momentum)      Ambulation/Gait   Ambulation/Gait Yes    Ambulation/Gait Assistance 5: Supervision    Ambulation/Gait Assistance Details Gait indoors with conversation, with speed changes fast/slow    Ambulation Distance (Feet) 400 Feet    Assistive device None    Gait Pattern Step-through pattern;Decreased arm swing - left;Decreased step length - right;Decreased step length - left;Right foot flat;Left foot flat;Decreased trunk rotation;Trunk flexed;Narrow base of support;Poor foot clearance - left;Poor foot clearance - right    Ambulation Surface Level;Indoor               Balance Exercises - 06/04/20 0001      Balance Exercises: Standing   SLS with Vectors Limitations Alt step taps to 6" step, then 12" step, standing on Airex.  BUE support x 10>1 UE support x 10 reps.    Step Ups Forward;6  inch;UE support 1;Limitations    Step Ups Limitations Single limb step up, 3 sec hold, starting from Airex    Heel Raises Both;10 reps;Limitations   2 sets   Heel Raises Limitations on blue air ex, 1 UE support    Toe Raise Both;10 reps;Limitations   2 sets   Toe Raise Limitations on blue air ex, 1 UE support    Other Standing Exercises On blue/red mats:  forward/back walking x 3 reps, with cues for increased foot clearance and step length with min guard assist.  Sidestepping on mats R and L x 2 reps with cues for foot clearance.  Sidestepping with step taps to cones, R and L x 1 with min guard assist.    Other Standing Exercises Comments Standing on Airex facing counter:  PWR! Moves in standing:  PWR! Up x 20 reps, PWR! Rock x 10 reps each side, PWR! Twist x 10 reps each side; PWR!  Step from mat to floor and return to mat with 1 UE support x 10 each side.  Supervision of therapist, no overt LOB, but cues for increased intensity/amplitude of movement patterns.               PT Short Term Goals - 05/26/20 1248      PT SHORT TERM GOAL #1   Title ALL STGS = LTGS             PT Long Term Goals - 05/26/20 1251      PT LONG TERM GOAL #1   Title Pt will be independent with HEP for improved strength, balance, transfers, and gait.  TARGET 06/11/20    Baseline Cues at times needed for PWR! Moves Flow (most recent update to HEP)    Time 2    Period Weeks    Status On-going    Target Date 06/11/20      PT LONG TERM GOAL #2   Title Pt will perform 8 of 10 reps of sit<>stand from <18" surface with minimal to no UE support and no LOB, for improved functional strength and transfer efficiency.    Baseline able to perform, but with extra time    Time 2    Period Weeks    Status On-going      PT LONG TERM GOAL #3   Title Pt will improve TUG cognitive score to less than or equal to 15 seconds for decreased fall risk.    Baseline 19.94 seconds on 04/27/20    Time 2    Period Weeks    Status On-going      PT LONG TERM GOAL #4   Title Pt will improve MiniBESTest score to at least 23/28 for decreased fall risk.    Baseline 14/28 at eval, 20/28 on 04/27/20    Time 2    Period Weeks    Status Revised      PT LONG TERM GOAL #5   Title Pt will ambulate at least 1000 ft, indoors/outdoor surfaces independently (versus appropriate assistive device) for improved community gait.    Baseline mod I (increased time)    Time 2    Period Weeks    Status On-going      PT LONG TERM GOAL #6   Title Pt will verbalize understanding of local Parkinson's disease resources, including options for continue community fitness.    Baseline Initiated, needs reinforcement    Time 2    Period Weeks    Status On-going  Plan - 06/04/20 2103    Clinical Impression  Statement Skilled PT session focused today on balance on compliant surfaces, including PWR! Moves and single limb stace activities.  On mat surfaces simulating outdoor surfaces, she demonstrates decreased step length and more forward flexed posture, needing cues for improved foot clearance.  She will benefit from continued skilled PT to further improve balance and gait towards LTGs.    Personal Factors and Comorbidities Comorbidity 3+;Time since onset of injury/illness/exacerbation;Other   Recent addition of Sinemet to medications   Comorbidities glaucoma, osteopenia, TB (as a child)    Examination-Activity Limitations Transfers;Locomotion Level;Stand    Examination-Participation Restrictions Church;Community Activity    Stability/Clinical Decision Making Evolving/Moderate complexity    Rehab Potential Good    PT Frequency 2x / week    PT Duration 2 weeks   \   PT Treatment/Interventions ADLs/Self Care Home Management;Gait training;Functional mobility training;Stair training;Therapeutic activities;Therapeutic exercise;Balance training;Neuromuscular re-education;DME Instruction;Patient/family education    PT Next Visit Plan continue to work on SLS on compliant surfaces, PWR! Moves on compliant surfaces, balance strategies; turns, sit<>stand from lower surfaces; work towards LTGs to be checked next week    Consulted and Agree with Plan of Care Patient           Patient will benefit from skilled therapeutic intervention in order to improve the following deficits and impairments:  Abnormal gait, Difficulty walking, Impaired tone, Decreased balance, Impaired flexibility, Decreased mobility, Decreased strength, Postural dysfunction  Visit Diagnosis: Unsteadiness on feet  Other abnormalities of gait and mobility     Problem List Patient Active Problem List   Diagnosis Date Noted  . Parkinson's disease ([place]) 02/17/2020  . Gait abnormality 12/04/2019  . Prediabetes 12/03/2018  . Vitamin D  deficiency 12/03/2018  . Anemia due to vitamin B12 deficiency 12/03/2018  . Fatigue 12/03/2018  . Urinary frequency 10/01/2018  . Vitamin B12 deficiency 10/01/2018  . 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 Osteopenia 08/23/2012    Denise Fair W. 06/04/2020, 9:09 PM  Frazier Butt., PT   [facility] [address] [place], [place], [address] Phone: [phone]   Fax:  [phone]  Name: Denise [name] MRN: [id number] Date of Birth: 11/23/1950

## 2020-06-04 NOTE — Therapy (Signed)
[facility] [address] [place], [place], [address] Phone: [phone]   Fax:  [phone]  Occupational Therapy Treatment  Patient Details  Name: Denise [name] MRN: [id number] Date of Birth: Jan 12, 1951 Referring Provider (OT): Dr. Jaynee Eagles   Encounter Date: 06/04/2020   OT End of Session - 06/04/20 1309    Visit Number 11    Number of Visits 15    Date for OT Re-Evaluation 05/08/20    Authorization Type [employer]--30 visit limit combined (OT/PT count as 1 if on same day)    Authorization Time Period renewed 04/30/20    Authorization - Visit Number 23    Authorization - Number of Visits 30    OT Start Time 0630    OT Stop Time 1400    OT Time Calculation (min) 43 min    Activity Tolerance Patient tolerated treatment well    Behavior During Therapy [facility] for tasks assessed/performed           Past Medical History:  Diagnosis Date  . Chronic kidney disease    as a child, no present issues   . Glaucoma   . Osteopenia 06/2018   T score -1.9 FRAX 3% / 0.2%  . 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 TUBAL LIGATION      There were no vitals filed for this visit.   Subjective Assessment - 06/04/20 1308    Subjective  Pt reports difficulty going through paper and turning pages    Pertinent History Newly diagnosed PD    Limitations Pt drives from an hour away    Patient Stated Goals improve handwriting,    Currently in Pain? No/denies            Checked goals and discussed progress.--see below   Pt instructed in strategies for handling/manipulating paper and turning pages.  Pt returned demo after initial min-mod cueing.  Flipping cards with R hand with min cueing for large amplitude movements for finger ext and supination.  Per pt questions, education provided regarding Parkinson etiology.  Also educated pt on OT, PT, ST indications for return to  therapy/possible ADL/IADL, speech and balance changes and recommended OT re-evaluation in approx 6-8 months.  Pt issued Multi-d questionnaire as a reference, but recommended journaling symptoms/questions for MD appts and therapy re-evals.  Reviewed ways to decr risk for future complications related to PD.  Pt verbalized understanding.         OT Long Term Goals - 06/04/20 1337      OT LONG TERM GOAL #1   Title I with PD specific HEP    Time 5    Period Weeks    Status Achieved   05/26/20:  needs reinforcement of updates to HEP     OT LONG TERM GOAL #2   Title Pt will verbalize understanding of adapted strategies to maximize safety and I with ADLs/ IADLs .    Time 5    Period Weeks    Status Achieved   can benefit from continued reinforcement-04/30/20     OT LONG TERM GOAL #3   Title Pt will verbalize understanding of ways to prevent future PD related complications and PD community resources.    Time 5    Period Weeks    Status Achieved   05/26/20     OT LONG TERM GOAL #4   Title Pt will write a short paragraph with 100% legibility  and no significant decrease in letter size    Period Weeks    Status Achieved      OT LONG TERM GOAL #5   Title Pt will demonstrate increased ease with dressing as eveidnced by decreasing PPT#4(don/ doff jacket) to 15 secs or less    Baseline 18.19 secs    Time 5    Period Weeks    Status Achieved   04/27/20:  17.75sec, 06/04/20:  14.25sec     OT LONG TERM GOAL #6   Title Pt will demonstrate ability to retrieve a lightweight object from overhead shelf with  -10 elbow extension with RUE    Baseline sh flex140, elbow ext -15    Period Weeks    Status Achieved   04/27/20:  145* with elbow ext WNL     OT LONG TERM GOAL #7   Title Pt will increase bilateral UE standing functional reach to 10 inches or greater to minimize fall risk and to increase I with ADLs.    Baseline RUE 8 in, LUE 9 inches    Time 5    Period Weeks    Status Achieved    06/04/20;  R-13.5", L-14"                Plan - 06/04/20 1309    Clinical Impression Statement Pt has made good progress with improved movement amplitude and performance of ADL strategies.  Pt is appropriate for d/c at this time.  All goals met.    OT Occupational Profile and History Problem Focused Assessment - Including review of records relating to presenting problem    Occupational performance deficits (Please refer to evaluation for details): ADL's;IADL's;Work;Leisure;Social Participation    Body Structure / Function / Physical Skills ADL;Balance;Mobility;Strength;Flexibility;UE functional use;FMC;Gait;Coordination;Decreased knowledge of precautions;GMC;ROM;Decreased knowledge of use of DME;Dexterity;IADL    OT Frequency 2x / week   starting next week or 8 visits total   OT Duration 4 weeks    OT Treatment/Interventions Self-care/ADL training;Therapeutic exercise;Balance training;Functional Mobility Training;Manual Therapy;Neuromuscular education;Therapeutic activities;DME and/or AE instruction;Patient/family education;Passive range of motion;Moist Heat;Cognitive remediation/compensation;Energy conservation    Plan d/c OT; recommend OT re-eval in aprox 6-8 months    Consulted and Agree with Plan of Care Patient           Patient will benefit from skilled therapeutic intervention in order to improve the following deficits and impairments:   Body Structure / Function / Physical Skills: ADL, Balance, Mobility, Strength, Flexibility, UE functional use, FMC, Gait, Coordination, Decreased knowledge of precautions, GMC, ROM, Decreased knowledge of use of DME, Dexterity, IADL       Visit Diagnosis: Other symptoms and signs involving the nervous system  Other lack of coordination  Abnormal posture  Unsteadiness on feet  Other abnormalities of gait and mobility    Problem List Patient Active Problem List   Diagnosis Date Noted  . Parkinson's disease ([place]) 02/17/2020  . Gait  abnormality 12/04/2019  . Prediabetes 12/03/2018  . Vitamin D deficiency 12/03/2018  . Anemia due to vitamin B12 deficiency 12/03/2018  . Fatigue 12/03/2018  . Urinary frequency 10/01/2018  . Vitamin B12 deficiency 10/01/2018  . 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 Osteopenia 08/23/2012    OCCUPATIONAL THERAPY DISCHARGE SUMMARY  Visits from Start of Care: 11  Current functional level related to goals / functional outcomes: See above   Remaining deficits: Bradykinesia, rigidity, decr coordination, abnormal posture, decr balance/functional  mobility   Education / Equipment: Pt was instructed in the following:  PD-specific HEP, adaptive strategies for ADLs/IADLs, ways to prevent future complications, and appropriate community resources.  Pt verbalized understanding of all education provided.   Plan: Patient agrees to discharge.  Patient goals were  met. Patient is being discharged due to being pleased with the current functional level.  Pt would benefit from occupational therapy evaluation in approx 6-8 months to assess for need for further therapy/functional changes due to progressive nature of diagnosis.     [facility] 06/04/2020, 2:20 PM  [facility] [address] [place], [place], [address] Phone: [phone]   Fax:  [phone]  Name: Denise [name] MRN: [id number] Date of Birth: 1951/06/13   Vianne Bulls, OTR/L [facility] [address] [place], [place]  [address] [phone] phone [phone] 06/04/20 2:20 PM

## 2020-06-08 ENCOUNTER — Ambulatory Visit: Payer: 59 | Admitting: Neurology

## 2020-06-08 ENCOUNTER — Encounter: Payer: Self-pay | Admitting: Neurology

## 2020-06-08 VITALS — BP 145/76 | HR 55 | Ht 66.0 in | Wt 132.0 lb

## 2020-06-08 DIAGNOSIS — G20 Parkinson's disease: Secondary | ICD-10-CM

## 2020-06-08 LAB — HM MAMMOGRAPHY

## 2020-06-08 MED ORDER — CARBIDOPA-LEVODOPA 25-100 MG PO TABS: ORAL_TABLET | ORAL | 4 refills | Status: DC

## 2020-06-08 NOTE — Progress Notes (Signed)
[id number]    Provider:  Dr Jaynee Eagles Requesting Provider: Minette Brine, FNP Primary Care Provider:  Minette Brine, FNP  CC:  Unsteady gait  Interval history 06/08/2020:   69 y.o. female here as requested by Minette Brine, FNP for trouble ambulating. PMHx CKD, obesity, TB, osteopenia, glaucoma with optic atrophy, B12 deficiency, fatigue, pre-diabetes.  Patient with hypophonia, hypokalemia, micrographia, shuffling gait, decreased arm swing, stooped posture, bradykinetic, suspect disorder in the Parkinson's disease spectrum(idiopathic PD, lewy body etc)  or possibly NPH or vascular dementia causing gait apraxia.  I discussed with patient, DaTScan c/w Parkinsonian disorder and correlates with clinical findings.    She is doing well. She was seen by the Movement Disorder team at [facility] at my suggestion. She is taking B12. On exam there is bradykinesia/parkinsonism with decreased stride and freezing of gait has improved on Sinemet. Continue Sinemet and regular exercise was advised by [facility] and f/u in one year. She found the meeting helpful and we are just stay the course. She has no side effects to the medication. No dizziness, no difficulty swallowing, no falls. Freezing gait has improved.   Interval history February 17, 2020: Patient was seen alone last time for gait abnormality and parkinsonism, she is here today for follow-up and discussion and also brings her daughter.  Daughter provides much information today as well.  We discussed findings which included MRI of the brain which showed some mild chronic microvascular changes and mild atrophy.  However DaTscan was consistent with a parkinsonian syndrome.  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  We had an extended visit today, reviewed case with daughter, discussed findings on MRI of the brain and findings on DaTscan.  We discussed pathophysiology of Parkinson's disease, natural  progression, treatment, answered all questions.  We discussed different treatment options, deep brain stimulation, clinical trials, skin checks for cancer which are increased in Parkinson's disease use, risk for falls, we reviewed thoughts of information with local resources including Parkinson's classes, Parkinson's support groups, online information.  We also discussed side effects of medications, dyskinesias, signs and symptoms of Parkinson's disease, she is not experiencing any swallowing difficulties at this time, we discussed safety and falls.  We had a very long discussion, we started her on Sinemet, answered all questions, we will see her back in about 6 weeks to see how she is doing.   HPI:  Cailin Gebel is a 69 y.o. female here as requested by Minette Brine, [place] for trouble ambulating. PMHx CKD, obesity, TB, osteopenia, glaucoma with optic atrophy, B12 deficiency, fatigue, pre-diabetes.  I reviewed [name]'s notes, daughter and nursing has her mom has been "tensing up when walking", stiffness, she has been going to a chiropractor and working with them for 6 months, sometimes she feels like a backpack on her back but no did not anything, someday she does better with water, she is writing smaller, taking longer to get dressed, gait slower, examination was largely unremarkable including cardiovascular, pulmonary, however a neurologic exam Romberg sign was slightly positive, slow to respond with some of her questions, mild cogwheeling to her right wrist.  She is here alone. She reports her best friend noticed her walking was "off" a little, she is doing better since seeing a chiropractor, she reports 3 years ago she was under a lot of stress and she kept to herself and that's when she started noticing symptoms, slowly progressive,, her daughter also noticed. She is a poor historian,  she walks slumped over, she is slower than she used to be, she gets in the habit of not picking feet up enough, no  tremors, no falls, she does not use a cane, her handwriting is getting smaller, she has always had a soft voise, no vivid dreams,no dizziness or postural symptoms, smell impaired for a long time, about 20 years lost sense of smell, no problem with taste, she wears adult briefs for incontinence, no difficulty swallowing, she has low B12 and she is going to take injections, no constipation, no vision changes. No history of dopamine blocking drugs. No neck pain. No other focal neurologic deficits, associated symptoms, inciting events or modifiable factors.  Reviewed notes, labs and imaging from outside physicians, which showed:  IMPRESSION: Personally reviewed images from 2014 x-ray lumbar spine and agree with the following  1.  Lateral projection is off axis significantly reducing sensitivity and specificity.  Suspected anterolisthesis at L5-S1. I cannot exclude pars defects at L5.  Lower lumbar facet arthropathy. 2. Prominence of stool throughout the colon suggests constipation. 3.  Expanded left pubic body is suggested on the frontal projection, possibly post-traumatic or from spurring.  Sed rate 2, CMP unremarkable slightly decreased GFR, vitamin B12 was 299 (in the normal range but may still be B12 deficiency), TSH normal, autoimmune panel double-stranded DNA/complement C3/ANA all negative or within normal range. Review of Systems: Patient complains of symptoms per HPI as well as the following symptoms: freezing,. Pertinent negatives and positives per HPI. All others negative.   Social History   Socioeconomic History  . Marital status: Single    Spouse name: Not on file  . Number of children: 3  . Years of education: Not on file  . Highest education level: Master's degree (e.g., MA, MS, MEng, MEd, MSW, MBA)  Occupational History  . Not on file  Tobacco Use  . Smoking status: Never Smoker  . Smokeless tobacco: Never Used  Vaping Use  . Vaping Use: Never used  Substance and Sexual  Activity  . Alcohol use: Never  . Drug use: No  . Sexual activity: Yes    Birth control/protection: Post-menopausal  Other Topics Concern  . Not on file  Social History Narrative   Lives alone   Right handed   Caffeine: none   Social Determinants of Health   Financial Resource Strain:   . Difficulty of Paying Living Expenses: Not on file  Food Insecurity:   . Worried About Charity fundraiser in the Last Year: Not on file  . Ran Out of Food in the Last Year: Not on file  Transportation Needs:   . Lack of Transportation (Medical): Not on file  . Lack of Transportation (Non-Medical): Not on file  Physical Activity:   . Days of Exercise per Week: Not on file  . Minutes of Exercise per Session: Not on file  Stress:   . Feeling of Stress : Not on file  Social Connections:   . Frequency of Communication with Friends and Family: Not on file  . Frequency of Social Gatherings with Friends and Family: Not on file  . Attends Religious Services: Not on file  . Active Member of Clubs or Organizations: Not on file  . Attends Archivist Meetings: Not on file  . Marital Status: Not on file  Intimate Partner Violence:   . Fear of Current or Ex-Partner: Not on file  . Emotionally Abused: Not on file  . Physically Abused: Not on file  .  Sexually Abused: Not on file    Family History  Problem Relation Age of Onset  . Hypertension Mother   . Heart disease Mother   . Diabetes Father   . Hypertension Father   . Other Father        had a condition that was "a cousin" to ALS   . Diabetes Sister   . Tuberculosis Maternal Grandmother   . Heart disease Sister   . Colitis Daughter   . Colon cancer Neg Hx   . Colon polyps Neg Hx   . Esophageal cancer Neg Hx   . Rectal cancer Neg Hx   . Stomach cancer Neg Hx     Past Medical History:  Diagnosis Date  . Chronic kidney disease    as a child, no present issues   . Glaucoma   . Osteopenia 06/2018   T score -1.9 FRAX 3% / 0.2%   . 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 Parkinson's disease ([place]) 02/17/2020  . Gait abnormality 12/04/2019  . Prediabetes 12/03/2018  . Vitamin D deficiency 12/03/2018  . Anemia due to vitamin B12 deficiency 12/03/2018  . Fatigue 12/03/2018  . Urinary frequency 10/01/2018  . Vitamin B12 deficiency 10/01/2018  . 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 Osteopenia 08/23/2012    Past Surgical History:  Procedure Laterality Date  . TUBAL LIGATION      Current Outpatient Medications  Medication Sig Dispense Refill  . atorvastatin (LIPITOR) 10 MG tablet TAKE 1 TABLET BY MOUTH AT BEDTIME MON - FRI 90 tablet 1  . carbidopa-levodopa (SINEMET IR) 25-100 MG tablet Take at 8,12 and 4pm. Try to avoid protein. 270 tablet 4  . Cholecalciferol (VITAMIN D3) 10 MCG (400 UNIT) CAPS Take 1 capsule by mouth daily. Pt unaware of dose    . hydrochlorothiazide (HYDRODIURIL) 12.5 MG tablet TAKE 1 TABLET BY MOUTH EVERY DAY 90 tablet 1  . timolol (BETIMOL) 0.25 % ophthalmic solution 1-2 drops daily.     . Travoprost, BAK Free, (TRAVATAN) 0.004 % SOLN ophthalmic solution 1 drop at bedtime.     No current facility-administered medications for this visit.    Allergies as of 06/08/2020 - Review Complete 06/08/2020  Allergen Reaction Noted  . Nystatin-triamcinolone Swelling 01/02/2020  . Triamcinolone acetonide  06/08/2020  . Cephalexin Itching and Rash 01/07/2020    Vitals: BP (!) 145/76 (BP Location: Left Arm, Patient Position: Sitting)   Pulse (!) 55   Ht 5\' 6"  (1.676 m)   Wt 132 lb (59.9 kg)   BMI 21.31 kg/m  Last Weight:  Wt Readings from Last 1 Encounters:  06/08/20 132 lb (59.9 kg)   Last Height:   Ht Readings from Last 1 Encounters:  06/08/20 5\' 6"  (1.676 m)     Physical exam: Exam: Gen: NAD, conversant, thin and appears slightly  rail            CV: RRR, no MRG. No Carotid Bruits. + peripheral edema distally, warm, nontender Eyes: Conjunctivae clear without exudates or hemorrhage  Neuro: Detailed Neurologic Exam  Speech:    Speech is normal; fluent and spontaneous with normal comprehension.  Cognition:    The patient is oriented to person, month, year, name of location     recent and remote memory impaired    language fluent;     normal attention,  concentration,     fund of knowledge: (knows president, VP), overall may be decreased slightly Cranial Nerves:    The pupils are equal, round, and reactive to light.Attempted fundoscopy could not visulize due to small pupils.  Visual fields are full to finger confrontation. Extraocular movements are intact. Trigeminal sensation is intact and the muscles of mastication are normal. The face is symmetric. Hypomimia. The palate elevates in the midline. Hearing intact to voice. Voice is nypophonic. Shoulder shrug is normal. The tongue has normal motion without fasciculations.   Coordination:    Normal finger to nose without dysmetria or ataxia but bradykinetic,  rapid alternating movements not significantly impaired  Gait:    Stooped, reduced arm swing, decreased stride shuffling low clearance and narrow gait. Freezing improved.    Motor Observation:    Postural tremor, high frequency low amplitude Tone:    Possibly some very minimal cogwheeling right arm with left hand facilitation, improved  Posture:    Posture is normal. normal erect    Strength:    Strength is V/V in the upper and lower limbs.      Sensation: intact to LT     Reflex Exam:  DTR's:    Deep tendon reflexes in the upper and lower extremities are slightly brisk bilaterally.   Toes:    The toes are equivocal bilaterally.   Clonus:    Clonus is absent.    Assessment/Plan:  69 y.o. female here as requested by Minette Brine, FNP for trouble ambulating. PMHx CKD, obesity, TB, osteopenia, glaucoma  with optic atrophy, B12 deficiency, fatigue, pre-diabetes.  Patient with hypophonia, hypokalemia, micrographia, shuffling gait, decreased arm swing, stooped posture, bradykinetic, suspect disorder in the Parkinson's disease spectrum(idiopathic PD, lewy body etc)  or possibly NPH or vascular dementia causing gait apraxia.  I discussed with patient, DaTScan c/w Parkinsonian disorder and correlates with clinical findings.   MRI of the brain w/wo contrast: very mild chronic microvascular ischemic change and mild generalized cortical atrophy most noted in the medial temporal lobes.  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  PT: [facility] for gait abnormality and Parkinson's disease - continues [facility]: Saw then 12/1, f/u one year. Young patient with likely idiopathic Parkinson's Disease, will refer to [place] to establish care, evaluation and treatment, to discuss future DBS,genetic testing and she is also interested in any clinical trials available) but I will continue to follow her as well  Discussed : Continue Carbidopa/Levodopa Regular skin checks Exercise can slow progression of PD, gave information on local resources, PD groups and classes in the area, she continues with PT Discussed safety with falls in the home and outside the home, drive during the day only to close places you know well, may consider using a walking aid, at night keep lights on maybe even a commode by the bed Hydrate to avoid dehydration urinary tract infections  Discussed fall risks and precautions again  Will see her back in 6 months. She will see [facility] again in 1 year and we will alternate.  No orders of the defined types were placed in this encounter.  Meds ordered this  encounter  Medications  . carbidopa-levodopa (SINEMET IR) 25-100 MG tablet    Sig: Take at 8,12 and 4pm. Try to avoid protein.    Dispense:  270 tablet    Refill:  4    Cc: Minette Brine, FNP,  Minette Brine, FNP  Sarina Ill, MD  [facility] [address] [place], [place] [address]  Phone [phone] Fax [phone]  I spent 25 minutes of face-to-face and non-face-to-face time with patient on the  1. Parkinson's disease ([place])    diagnosis.  This included previsit chart review, lab review, study review, order entry, electronic health record documentation, patient education on the different diagnostic and therapeutic options, counseling and coordination of care, risks and benefits of management, compliance, or risk factor reduction

## 2020-06-09 ENCOUNTER — Ambulatory Visit: Payer: 59 | Admitting: Physical Therapy

## 2020-06-09 ENCOUNTER — Other Ambulatory Visit: Payer: Self-pay

## 2020-06-09 DIAGNOSIS — R2689 Other abnormalities of gait and mobility: Secondary | ICD-10-CM

## 2020-06-09 DIAGNOSIS — R2681 Unsteadiness on feet: Secondary | ICD-10-CM

## 2020-06-09 DIAGNOSIS — R29818 Other symptoms and signs involving the nervous system: Secondary | ICD-10-CM | POA: Diagnosis not present

## 2020-06-09 NOTE — Therapy (Signed)
[facility] [address] [place], [place], [address] Phone: [phone]   Fax:  [phone]  Physical Therapy Treatment  Patient Details  Name: Denise [name] MRN: [id number] Date of Birth: 04-13-1951 Referring Provider (PT): Dr. Jaynee Eagles   Encounter Date: 06/09/2020   PT End of Session - 06/09/20 1437    Visit Number 21    Number of Visits 23    Date for PT Re-Evaluation 01/77/93   recert for additional 2x week for 2 weeks   Authorization Type [place]; VL 30 for PT    Authorization - Visit Number 24   Amended 06/04/2020 to include OT visits   Authorization - Number of Visits 30    PT Start Time 9030    PT Stop Time 1400    PT Time Calculation (min) 43 min    Equipment Utilized During Treatment Gait belt    Activity Tolerance Patient tolerated treatment well    Behavior During Therapy [facility] for tasks assessed/performed           Past Medical History:  Diagnosis Date  . Chronic kidney disease    as a child, no present issues   . Glaucoma   . Osteopenia 06/2018   T score -1.9 FRAX 3% / 0.2%  . 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 TUBAL LIGATION      There were no vitals filed for this visit.   Subjective Assessment - 06/09/20 1319    Subjective No changes, nothing new to report.  To check back with Dr. Jaynee Eagles in 6 months or so.  Still need to get in touch with [employer] or [employer].    Pertinent History glaucoma, osteopenia, TB (as a child)    Patient Stated Goals Pt's goals for therapy are to get stronger in legs and hands.    Currently in Pain? No/denies                             [facility] Adult PT Treatment/Exercise - 06/09/20 0001      Ambulation/Gait   Ambulation/Gait Yes    Ambulation/Gait Assistance 5: Supervision    Ambulation/Gait Assistance Details Gait with conversation tasks    Ambulation Distance (Feet) 1000 Feet     Assistive device None    Gait Pattern Step-through pattern;Decreased arm swing - left;Decreased step length - right;Decreased step length - left;Right foot flat;Left foot flat;Decreased trunk rotation;Trunk flexed;Narrow base of support;Poor foot clearance - left;Poor foot clearance - right    Ambulation Surface Level;Indoor    Gait Comments Discussed options for walking activities upon d/c:  in home, in driveway, in church hallway or fellowship hall.  Reminded pt of ongoing exercise handout previously given, which including walking as part of ongoing HEP.      Mini-BESTest   Sit To Stand Normal: Comes to stand without use of hands and stabilizes independently.    Rise to Toes Moderate: Heels up, but not full range (smaller than when holding hands), OR noticeable instability for 3 s.    Stand on one leg (left) Moderate: < 20 s   2.06, 5.75   Stand on one leg (right) Moderate: < 20 s   1.87, 3.34   Stand on one leg - lowest score 1    Compensatory Stepping Correction - Forward Normal: Recovers independently with a single, large step (second realignement is allowed).  Compensatory Stepping Correction - Backward Moderate: More than one step is required to recover equilibrium    Compensatory Stepping Correction - Left Lateral Normal: Recovers independently with 1 step (crossover or lateral OK)    Compensatory Stepping Correction - Right Lateral Normal: Recovers independently with 1 step (crossover or lateral OK)    Stepping Corredtion Lateral - lowest score 2    Stance - Feet together, eyes open, firm surface  Normal: 30s    Stance - Feet together, eyes closed, foam surface  Moderate: < 30s   20.62   Incline - Eyes Closed Moderate: Stands independently < 30s OR aligns with surface    Change in Gait Speed Normal: Significantly changes walkling speed without imbalance    Walk with head turns - Horizontal Normal: performs head turns with no change in gait speed and good balance    Walk with pivot  turns Normal: Turns with feet close FAST (< 3 steps) with good balance.    Step over obstacles Normal: Able to step over box with minimal change of gait speed and with good balance.    Timed UP & GO with Dual Task Moderate: Dual Task affects either counting OR walking (>10%) when compared to the TUG without Dual Task.    Mini-BEST total score 22          Reviewed corner balance exercises as HEP:  Pt return demo understanding, with cues for holding support at chair for eyes closed  Standing on pillow/towel feet apart:  EO head turns/head nods x 5; EC head turns/head nods x 5 -standing on pillow/towel feet together:  EO head turns/head nods x 5; EC head turns/head nods x 5          PT Short Term Goals - 05/26/20 1248      PT SHORT TERM GOAL #1   Title ALL STGS = LTGS             PT Long Term Goals - 06/09/20 1438      PT LONG TERM GOAL #1   Title Pt will be independent with HEP for improved strength, balance, transfers, and gait.  TARGET 06/11/20    Baseline Cues at times needed for PWR! Moves Flow (most recent update to HEP)    Time 2    Period Weeks    Status On-going      PT LONG TERM GOAL #2   Title Pt will perform 8 of 10 reps of sit<>stand from <18" surface with minimal to no UE support and no LOB, for improved functional strength and transfer efficiency.    Baseline able to perform, but with extra time    Time 2    Period Weeks    Status On-going      PT LONG TERM GOAL #3   Title Pt will improve TUG cognitive score to less than or equal to 15 seconds for decreased fall risk.    Baseline 19.94 seconds on 04/27/20    Time 2    Period Weeks    Status On-going      PT LONG TERM GOAL #4   Title Pt will improve MiniBESTest score to at least 23/28 for decreased fall risk.    Baseline 14/28 at eval, 20/28 on 04/27/20; 22/28 06/09/2020    Time 2    Period Weeks    Status Not Met      PT LONG TERM GOAL #5   Title Pt will ambulate at least 1000 ft, indoors/outdoor  surfaces  independently (versus appropriate assistive device) for improved community gait.    Baseline mod I (increased time)    Time 2    Period Weeks    Status On-going      PT LONG TERM GOAL #6   Title Pt will verbalize understanding of local Parkinson's disease resources, including options for continue community fitness.    Baseline Initiated, needs reinforcement    Time 2    Period Weeks    Status Achieved                 Plan - 06/09/20 1438    Clinical Impression Statement Assessed MiniBESTest today, with improved score to 22/28, just not to goal level.  Provided again updated handout for Power over Parkinson's resources (and pt agrees to give email address for POP email list for reminders).  LTG 4 not met; LTG 6 met.  Pt working towards remaining goals and for d/c next visit.    Personal Factors and Comorbidities Comorbidity 3+;Time since onset of injury/illness/exacerbation;Other   Recent addition of Sinemet to medications   Comorbidities glaucoma, osteopenia, TB (as a child)    Examination-Activity Limitations Transfers;Locomotion Level;Stand    Examination-Participation Restrictions Church;Community Activity    Stability/Clinical Decision Making Evolving/Moderate complexity    Rehab Potential Good    PT Frequency 2x / week    PT Duration 2 weeks   \   PT Treatment/Interventions ADLs/Self Care Home Management;Gait training;Functional mobility training;Stair training;Therapeutic activities;Therapeutic exercise;Balance training;Neuromuscular re-education;DME Instruction;Patient/family education    PT Next Visit Plan Check remaining goals and plan for d/c next visit.  Will need to review PWR! Moves Flow in standing; discuss return PT eval in 6 months    Consulted and Agree with Plan of Care Patient           Patient will benefit from skilled therapeutic intervention in order to improve the following deficits and impairments:  Abnormal gait, Difficulty walking, Impaired  tone, Decreased balance, Impaired flexibility, Decreased mobility, Decreased strength, Postural dysfunction  Visit Diagnosis: Other abnormalities of gait and mobility  Unsteadiness on feet     Problem List Patient Active Problem List   Diagnosis Date Noted  . Parkinson's disease ([place]) 02/17/2020  . Gait abnormality 12/04/2019  . Prediabetes 12/03/2018  . Vitamin D deficiency 12/03/2018  . Anemia due to vitamin B12 deficiency 12/03/2018  . Fatigue 12/03/2018  . Urinary frequency 10/01/2018  . Vitamin B12 deficiency 10/01/2018  . 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 Osteopenia 08/23/2012    MARRIOTT,AMY W. 06/09/2020, 2:43 PM  Frazier Butt., PT   [place] [facility] [address] [place], [place], [address] Phone: [phone]   Fax:  [phone]  Name: Denise [name] MRN: [id number] Date of Birth: 10/03/1950

## 2020-06-11 ENCOUNTER — Other Ambulatory Visit: Payer: Self-pay

## 2020-06-11 ENCOUNTER — Ambulatory Visit: Payer: 59 | Admitting: Physical Therapy

## 2020-06-11 DIAGNOSIS — R29818 Other symptoms and signs involving the nervous system: Secondary | ICD-10-CM

## 2020-06-11 DIAGNOSIS — R2681 Unsteadiness on feet: Secondary | ICD-10-CM

## 2020-06-11 DIAGNOSIS — R2689 Other abnormalities of gait and mobility: Secondary | ICD-10-CM

## 2020-06-11 NOTE — Therapy (Signed)
[facility] [address] [place], [place], [address] Phone: [phone]   Fax:  [phone]  Physical Therapy Treatment/Discharge Summary  Patient Details  Name: Denise [name] MRN: [id number] Date of Birth: [birth year] Referring Provider (PT): Dr. Jaynee Eagles   Encounter Date: 06/11/2020   PT End of Session - 06/11/20 1402    Visit Number 22    Number of Visits 23    Date for PT Re-Evaluation 96/28/36   recert for additional 2x week for 2 weeks   Authorization Type [place]; VL 30 for PT    Authorization - Visit Number 25   Amended 06/04/2020 to include OT visits   Authorization - Number of Visits 30    PT Start Time 1022    PT Stop Time 1100    PT Time Calculation (min) 38 min    Equipment Utilized During Treatment Gait belt    Activity Tolerance Patient tolerated treatment well    Behavior During Therapy WFL for tasks assessed/performed           Past Medical History:  Diagnosis Date  . Chronic kidney disease    as a child, no present issues   . Glaucoma   . Osteopenia 06/2018   T score -1.9 FRAX 3% / 0.2%  . 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 TUBAL LIGATION      There were no vitals filed for this visit.   Subjective Assessment - 06/11/20 1025    Subjective No changes, nothing new.  Therapy has been very helpful.    Pertinent History glaucoma, osteopenia, TB (as a child)    Patient Stated Goals Pt's goals for therapy are to get stronger in legs and hands.    Currently in Pain? No/denies                             [facility] Adult PT Treatment/Exercise - 06/11/20 0001      Transfers   Transfers Sit to Stand;Stand to Sit    Sit to Stand 6: Modified independent (Device/Increase time);Without upper extremity assist;From bed;From chair/3-in-1    Stand to Sit 6: Modified independent (Device/Increase time);Without upper extremity assist;To  bed;To chair/3-in-1    Number of Reps 10 reps;2 sets   from 18" chair, 16" chair, No LOB     Ambulation/Gait   Ambulation/Gait Yes    Ambulation/Gait Assistance 6: Modified independent (Device/Increase time)    Ambulation Distance (Feet) 1000 Feet    Assistive device None    Gait Pattern Step-through pattern;Decreased arm swing - left;Decreased step length - right;Decreased step length - left;Right foot flat;Left foot flat;Decreased trunk rotation;Trunk flexed;Narrow base of support;Poor foot clearance - left;Poor foot clearance - right    Ambulation Surface Level;Indoor      Standardized Balance Assessment   Standardized Balance Assessment Timed Up and Go Test      Timed Up and Go Test   TUG Cognitive TUG    Cognitive TUG (seconds) 16      Self-Care   Self-Care Other Self-Care Comments    Other Self-Care Comments  Discussed progress towards goals, POC and plans for d/c this visit.  Discussed return screen for PT, OT, speech; discussed replacing therapy time with time at [employer] or [employer] and continue HEP and walking at home.           Neuro Re-education:  Pt  performs standing PWR! Moves Flow, as review of HEP.  PWR! Up>PWR! Rock>PWR! Twist>PWR! Step x 10 reps with cues for step and weightshift for increased step length and foot clearance.  Discussed she can do these ex as PWR! Moves flow or each exercise 10 reps.          PT Short Term Goals - 05/26/20 1248      PT SHORT TERM GOAL #1   Title ALL STGS = LTGS             PT Long Term Goals - 06/11/20 1041      PT LONG TERM GOAL #1   Title Pt will be independent with HEP for improved strength, balance, transfers, and gait.  TARGET 06/11/20    Time 2    Period Weeks    Status Achieved      PT LONG TERM GOAL #2   Title Pt will perform 8 of 10 reps of sit<>stand from <18" surface with minimal to no UE support and no LOB, for improved functional strength and transfer efficiency.    Time 2    Period Weeks     Status Achieved      PT LONG TERM GOAL #3   Title Pt will improve TUG cognitive score to less than or equal to 15 seconds for decreased fall risk.    Baseline 19.94 seconds on 04/27/20; 16.9    Time 2    Period Weeks    Status Not Met      PT LONG TERM GOAL #4   Title Pt will improve MiniBESTest score to at least 23/28 for decreased fall risk.    Baseline 14/28 at eval, 20/28 on 04/27/20; 22/28 06/09/2020    Time 2    Period Weeks    Status Not Met      PT LONG TERM GOAL #5   Title Pt will ambulate at least 1000 ft, indoors/outdoor surfaces independently (versus appropriate assistive device) for improved community gait.    Baseline mod I (increased time); simulated outdoor surfaces indoors due to cold weather    Time 2    Period Weeks    Status Achieved      PT LONG TERM GOAL #6   Title Pt will verbalize understanding of local Parkinson's disease resources, including options for continue community fitness.    Baseline Initiated, needs reinforcement    Time 2    Period Weeks    Status Achieved                 Plan - 06/11/20 1405    Clinical Impression Statement Assessed remaining goals this visit.  Pt has met 4 of 6 LTGs.  She has met LTG 1, 2, 5 and 6.  LTG 3 not met for TUG cognitive score, though it has improved, just not to goal level.  She has made improvements since initiation of PT in balance and overall mobility scores.  She is appropriate for d/c at this time.    Personal Factors and Comorbidities Comorbidity 3+;Time since onset of injury/illness/exacerbation;Other   Recent addition of Sinemet to medications   Comorbidities glaucoma, osteopenia, TB (as a child)    Examination-Activity Limitations Transfers;Locomotion Level;Stand    Examination-Participation Restrictions [employer];Community Activity    Stability/Clinical Decision Making Evolving/Moderate complexity    Rehab Potential Good    PT Frequency 2x / week    PT Duration 2 weeks   \   PT  Treatment/Interventions ADLs/Self Care Home Management;Gait training;Functional mobility  training;Stair training;Therapeutic activities;Therapeutic exercise;Balance training;Neuromuscular re-education;DME Instruction;Patient/family education    PT Next Visit Plan Discharge this visit; pt agrees to screens in 6 months.    Consulted and Agree with Plan of Care Patient           Patient will benefit from skilled therapeutic intervention in order to improve the following deficits and impairments:  Abnormal gait,Difficulty walking,Impaired tone,Decreased balance,Impaired flexibility,Decreased mobility,Decreased strength,Postural dysfunction  Visit Diagnosis: Other abnormalities of gait and mobility  Unsteadiness on feet  Other symptoms and signs involving the nervous system     Problem List Patient Active Problem List   Diagnosis Date Noted  . Parkinson's disease ([place]) 02/17/2020  . Gait abnormality 12/04/2019  . Prediabetes 12/03/2018  . Vitamin D deficiency 12/03/2018  . Anemia due to vitamin B12 deficiency 12/03/2018  . Fatigue 12/03/2018  . Urinary frequency 10/01/2018  . Vitamin B12 deficiency 10/01/2018  . 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 Osteopenia 08/23/2012    Tierria Watson W. 06/11/2020, 2:07 PM Frazier Butt., PT  [facility] [address] [place], [place], [address] Phone: [phone]   Fax:  [phone]  Name: Denise [name] MRN: [id number] Date of Birth: [birth year]   PHYSICAL THERAPY DISCHARGE SUMMARY  Visits from Start of Care: 22  Current functional level related to goals / functional outcomes:  PT Long Term Goals - 06/11/20 1041      PT LONG TERM GOAL #1   Title Pt will be independent with HEP for improved strength, balance, transfers, and gait.  TARGET 06/11/20    Time 2    Period  Weeks    Status Achieved      PT LONG TERM GOAL #2   Title Pt will perform 8 of 10 reps of sit<>stand from <18" surface with minimal to no UE support and no LOB, for improved functional strength and transfer efficiency.    Time 2    Period Weeks    Status Achieved      PT LONG TERM GOAL #3   Title Pt will improve TUG cognitive score to less than or equal to 15 seconds for decreased fall risk.    Baseline 19.94 seconds on 04/27/20; 16.9    Time 2    Period Weeks    Status Not Met      PT LONG TERM GOAL #4   Title Pt will improve MiniBESTest score to at least 23/28 for decreased fall risk.    Baseline 14/28 at eval, 20/28 on 04/27/20; 22/28 06/09/2020    Time 2    Period Weeks    Status Not Met      PT LONG TERM GOAL #5   Title Pt will ambulate at least 1000 ft, indoors/outdoor surfaces independently (versus appropriate assistive device) for improved community gait.    Baseline mod I (increased time); simulated outdoor surfaces indoors due to cold weather    Time 2    Period Weeks    Status Achieved      PT LONG TERM GOAL #6   Title Pt will verbalize understanding of local Parkinson's disease resources, including options for continue community fitness.    Baseline Initiated, needs reinforcement    Time 2    Period Weeks    Status Achieved          Pt has met 4 of 6 LTGs.   Remaining deficits: Posture, bradykinesia, balanc  Education / Equipment: Educated in [employer], fall prevention, local Parkinson's disease resources.  Plan: Patient agrees to discharge.  Patient goals were partially met. Patient is being discharged due to being pleased with the current functional level.  ?????  Recommend return PT, OT, speech therapy screens in 6 months due to progressive nature of disease process.    Frazier Butt., PT

## 2020-06-18 ENCOUNTER — Encounter: Payer: Self-pay | Admitting: Nurse Practitioner

## 2020-08-04 ENCOUNTER — Other Ambulatory Visit: Payer: Self-pay

## 2020-08-04 ENCOUNTER — Other Ambulatory Visit: Payer: Self-pay | Admitting: Nurse Practitioner

## 2020-08-04 ENCOUNTER — Ambulatory Visit (INDEPENDENT_AMBULATORY_CARE_PROVIDER_SITE_OTHER): Payer: 59 | Admitting: Nurse Practitioner

## 2020-08-04 ENCOUNTER — Encounter: Payer: Self-pay | Admitting: Nurse Practitioner

## 2020-08-04 VITALS — BP 126/88 | HR 64 | Temp 98.3°F | Ht 66.0 in | Wt 132.6 lb

## 2020-08-04 DIAGNOSIS — Z0000 Encounter for general adult medical examination without abnormal findings: Secondary | ICD-10-CM

## 2020-08-04 DIAGNOSIS — M8580 Other specified disorders of bone density and structure, unspecified site: Secondary | ICD-10-CM

## 2020-08-04 DIAGNOSIS — R7303 Prediabetes: Secondary | ICD-10-CM | POA: Diagnosis not present

## 2020-08-04 DIAGNOSIS — E559 Vitamin D deficiency, unspecified: Secondary | ICD-10-CM

## 2020-08-04 DIAGNOSIS — R600 Localized edema: Secondary | ICD-10-CM

## 2020-08-04 DIAGNOSIS — Z23 Encounter for immunization: Secondary | ICD-10-CM

## 2020-08-04 DIAGNOSIS — R601 Generalized edema: Secondary | ICD-10-CM

## 2020-08-04 MED ORDER — PNEUMOCOCCAL 13-VAL CONJ VACC IM SUSP: 0.5000 mL | INTRAMUSCULAR | 0 refills | Status: AC

## 2020-08-04 MED ORDER — HYDROCHLOROTHIAZIDE 12.5 MG PO TABS: ORAL_TABLET | ORAL | 1 refills | Status: DC

## 2020-08-04 NOTE — Patient Instructions (Signed)
Health Maintenance, Female Adopting a healthy lifestyle and getting preventive care are important in promoting health and wellness. Ask your health care provider about:  The right schedule for you to have regular tests and exams.  Things you can do on your own to prevent diseases and keep yourself healthy. What should I know about diet, weight, and exercise? Eat a healthy diet  Eat a diet that includes plenty of vegetables, fruits, low-fat dairy products, and lean protein.  Do not eat a lot of foods that are high in solid fats, added sugars, or sodium.   Maintain a healthy weight Body mass index (BMI) is used to identify weight problems. It estimates body fat based on height and weight. Your health care provider can help determine your BMI and help you achieve or maintain a healthy weight. Get regular exercise Get regular exercise. This is one of the most important things you can do for your health. Most adults should:  Exercise for at least 150 minutes each week. The exercise should increase your heart rate and make you sweat (moderate-intensity exercise).  Do strengthening exercises at least twice a week. This is in addition to the moderate-intensity exercise.  Spend less time sitting. Even light physical activity can be beneficial. Watch cholesterol and blood lipids Have your blood tested for lipids and cholesterol at 69 years of age, then have this test every 5 years. Have your cholesterol levels checked more often if:  Your lipid or cholesterol levels are high.  You are older than 69 years of age.  You are at high risk for heart disease. What should I know about cancer screening? Depending on your health history and family history, you may need to have cancer screening at various ages. This may include screening for:  Breast cancer.  Cervical cancer.  Colorectal cancer.  Skin cancer.  Lung cancer. What should I know about heart disease, diabetes, and high blood  pressure? Blood pressure and heart disease  High blood pressure causes heart disease and increases the risk of stroke. This is more likely to develop in people who have high blood pressure readings, are of African descent, or are overweight.  Have your blood pressure checked: ? Every 3-5 years if you are 18-39 years of age. ? Every year if you are 40 years old or older. Diabetes Have regular diabetes screenings. This checks your fasting blood sugar level. Have the screening done:  Once every three years after age 40 if you are at a normal weight and have a low risk for diabetes.  More often and at a younger age if you are overweight or have a high risk for diabetes. What should I know about preventing infection? Hepatitis B If you have a higher risk for hepatitis B, you should be screened for this virus. Talk with your health care provider to find out if you are at risk for hepatitis B infection. Hepatitis C Testing is recommended for:  Everyone born from 1945 through 1965.  Anyone with known risk factors for hepatitis C. Sexually transmitted infections (STIs)  Get screened for STIs, including gonorrhea and chlamydia, if: ? You are sexually active and are younger than 69 years of age. ? You are older than 69 years of age and your health care provider tells you that you are at risk for this type of infection. ? Your sexual activity has changed since you were last screened, and you are at increased risk for chlamydia or gonorrhea. Ask your health care provider   if you are at risk.  Ask your health care provider about whether you are at high risk for HIV. Your health care provider may recommend a prescription medicine to help prevent HIV infection. If you choose to take medicine to prevent HIV, you should first get tested for HIV. You should then be tested every 3 months for as long as you are taking the medicine. Pregnancy  If you are about to stop having your period (premenopausal) and  you may become pregnant, seek counseling before you get pregnant.  Take 400 to 800 micrograms (mcg) of folic acid every day if you become pregnant.  Ask for birth control (contraception) if you want to prevent pregnancy. Osteoporosis and menopause Osteoporosis is a disease in which the bones lose minerals and strength with aging. This can result in bone fractures. If you are 65 years old or older, or if you are at risk for osteoporosis and fractures, ask your health care provider if you should:  Be screened for bone loss.  Take a calcium or vitamin D supplement to lower your risk of fractures.  Be given hormone replacement therapy (HRT) to treat symptoms of menopause. Follow these instructions at home: Lifestyle  Do not use any products that contain nicotine or tobacco, such as cigarettes, e-cigarettes, and chewing tobacco. If you need help quitting, ask your health care provider.  Do not use street drugs.  Do not share needles.  Ask your health care provider for help if you need support or information about quitting drugs. Alcohol use  Do not drink alcohol if: ? Your health care provider tells you not to drink. ? You are pregnant, may be pregnant, or are planning to become pregnant.  If you drink alcohol: ? Limit how much you use to 0-1 drink a day. ? Limit intake if you are breastfeeding.  Be aware of how much alcohol is in your drink. In the [place] one drink equals one 12 oz bottle of beer (355 mL), one 5 oz glass of wine (148 mL), or one 1 oz glass of hard liquor (44 mL). General instructions  Schedule regular health, dental, and eye exams.  Stay current with your vaccines.  Tell your health care provider if: ? You often feel depressed. ? You have ever been abused or do not feel safe at home. Summary  Adopting a healthy lifestyle and getting preventive care are important in promoting health and wellness.  Follow your health care provider's instructions about healthy  diet, exercising, and getting tested or screened for diseases.  Follow your health care provider's instructions on monitoring your cholesterol and blood pressure. This information is not intended to replace advice given to you by your health care provider. Make sure you discuss any questions you have with your health care provider. Document Revised: 06/13/2018 Document Reviewed: 06/13/2018 Elsevier Patient Education  2021 [employer]  

## 2020-08-04 NOTE — Progress Notes (Signed)
I,Yamilka [employer] as a Education administrator for [employer], FNP.,have documented all relevant documentation on the behalf of Minette Brine, FNP,as directed by  Minette Brine, FNP while in the presence of Minette Brine, [place]. This visit occurred during the SARS-CoV-2 public health emergency.  Safety protocols were in place, including screening questions prior to the visit, additional usage of staff PPE, and extensive cleaning of exam room while observing appropriate contact time as indicated for disinfecting solutions.  Subjective:     Patient ID: Denise [name] , female    DOB: January 15, 1951 , 70 y.o.   MRN: [id number]   Chief Complaint  Patient presents with  . Annual Exam    HPI  Here for hm.  She has seen Dr. Jaynee Eagles who has referred to [facility] where they specialist. She is no longer going to Therapy.  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  She is currently residing in [place].    Past Medical History:  Diagnosis Date  . Chronic kidney disease    as a child, no present issues   . Glaucoma   . Osteopenia 06/2018   T score -1.9 FRAX 3% / 0.2%  . 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 Hypertension Mother   . Heart disease Mother   . Diabetes Father   . Hypertension Father   . Other Father        had a condition that was "a cousin" to ALS   . Diabetes Sister   . Tuberculosis Maternal Grandmother   . Heart disease Sister   . Colitis Daughter   . Colon cancer Neg Hx   . Colon polyps Neg Hx   . Esophageal cancer Neg Hx   . Rectal cancer Neg Hx   . Stomach cancer Neg Hx      Current Outpatient Medications:  .  atorvastatin (LIPITOR) 10 MG tablet, TAKE 1 TABLET BY MOUTH AT BEDTIME MON - FRI, Disp: 90 tablet, Rfl: 1 .  carbidopa-levodopa (SINEMET IR) 25-100 MG tablet, Take at 8,12 and 4pm. Try to avoid protein., Disp: 270 tablet, Rfl: 4 .   Cholecalciferol (VITAMIN D3) 10 MCG (400 UNIT) CAPS, Take 1 capsule by mouth daily. Pt unaware of dose, Disp: , Rfl:  .  timolol (BETIMOL) 0.25 % ophthalmic solution, 1-2 drops daily. , Disp: , Rfl:  .  Travoprost, BAK Free, (TRAVATAN) 0.004 % SOLN ophthalmic solution, 1 drop at bedtime., Disp: , Rfl:  .  hydrochlorothiazide (HYDRODIURIL) 12.5 MG tablet, Take 1 tablet by mouth daily as needed for leg swelling, Disp: 90 tablet, Rfl: 1   Allergies  Allergen Reactions  . Nystatin-Triamcinolone Swelling    BLISTERS  . Triamcinolone Acetonide     Blisters   . Cephalexin Itching and Rash      The patient states she uses post menopausal status for birth control.  No LMP recorded. Patient is postmenopausal.. Negative for Dysmenorrhea and Negative for Menorrhagia. Negative for: breast discharge, breast lump(s), breast pain and breast self exam. Associated symptoms include abnormal vaginal bleeding. Pertinent negatives include abnormal bleeding (hematology), anxiety, decreased libido, depression, difficulty falling sleep, dyspareunia, history of infertility, nocturia, sexual dysfunction, sleep disturbances, urinary incontinence, urinary urgency, vaginal discharge and vaginal itching. Diet regular. The patient states her exercise level is minimal - she is stretching for her Parkinson's regimen and will  walking for approximately 1 hour at least 4 days a week.    The patient's tobacco use is:  Social History   Tobacco Use  Smoking Status Never Smoker  Smokeless Tobacco Never Used   She has been exposed to passive smoke. The patient's alcohol use is:  Social History   Substance and Sexual Activity  Alcohol Use Never      Review of Systems  Constitutional: Negative.   HENT: Negative.   Eyes: Negative.   Respiratory: Negative.   Cardiovascular: Negative.  Negative for leg swelling.  Gastrointestinal: Negative.   Endocrine: Negative.   Genitourinary: Negative.   Musculoskeletal: Positive for  gait problem (occasional shuffle gait - currently taking sinemet for parkinsons).  Skin: Negative.   Allergic/Immunologic: Negative.   Neurological: Negative for dizziness, tremors and headaches.  Hematological: Negative.   Psychiatric/Behavioral: Negative.      Today's Vitals   08/04/20 1006  BP: 126/88  Pulse: 64  Temp: 98.3 F (36.8 C)  TempSrc: Oral  Weight: 132 lb 9.6 oz (60.1 kg)  Height: '5\' 6"'  (1.676 m)  PainSc: 0-No pain   Body mass index is 21.4 kg/m.   Objective:  Physical Exam Constitutional:      General: She is not in acute distress.    Appearance: Normal appearance. She is well-developed and normal weight.     Comments: Thin   HENT:     Head: Normocephalic and atraumatic.     Right Ear: Hearing, tympanic membrane, ear canal and external ear normal. There is no impacted cerumen.     Left Ear: Hearing, tympanic membrane, ear canal and external ear normal. There is no impacted cerumen.     Nose:     Comments: Deferred - masked    Mouth/Throat:     Comments: Deferred - masked Eyes:     General: Lids are normal.     Extraocular Movements: Extraocular movements intact.     Conjunctiva/sclera: Conjunctivae normal.     Pupils: Pupils are equal, round, and reactive to light.     Funduscopic exam:    Right eye: No papilledema.        Left eye: No papilledema.  Neck:     Thyroid: No thyroid mass.     Vascular: No carotid bruit.  Cardiovascular:     Rate and Rhythm: Normal rate and regular rhythm.     Pulses: Normal pulses.     Heart sounds: Normal heart sounds. No murmur heard.   Pulmonary:     Effort: Pulmonary effort is normal. No respiratory distress.     Breath sounds: Normal breath sounds. No wheezing.  Chest:     Chest wall: No mass.  Breasts:     Tanner Score is 5.     Right: Normal. No mass, tenderness, axillary adenopathy or supraclavicular adenopathy.     Left: Normal. No mass, tenderness, axillary adenopathy or supraclavicular adenopathy.     Abdominal:     General: Abdomen is flat. Bowel sounds are normal. There is no distension.     Palpations: Abdomen is soft.     Tenderness: There is no abdominal tenderness.  Musculoskeletal:        General: No swelling or tenderness. Normal range of motion.     Cervical back: Full passive range of motion without pain, normal range of motion and neck supple.     Right lower leg: No edema.     Left lower leg: No edema.     Comments: She is  wearing support socks, gait is improved  Lymphadenopathy:     Upper Body:     Right upper body: No supraclavicular, axillary or pectoral adenopathy.     Left upper body: No supraclavicular, axillary or pectoral adenopathy.  Skin:    General: Skin is warm and dry.     Capillary Refill: Capillary refill takes less than 2 seconds.  Neurological:     General: No focal deficit present.     Mental Status: She is alert and oriented to person, place, and time.     Cranial Nerves: No cranial nerve deficit.     Sensory: No sensory deficit.     Motor: No weakness.  Psychiatric:        Mood and Affect: Mood normal.        Behavior: Behavior normal.        Thought Content: Thought content normal.        Judgment: Judgment normal.         Assessment And Plan:     1. Encounter for general adult medical examination w/o abnormal findings . Behavior modifications discussed and diet history reviewed.   . Pt will continue to exercise regularly and modify diet with low GI, plant based foods and decrease intake of processed foods.  . Recommend intake of daily multivitamin, Vitamin D, and calcium.  . Recommend mammogram and colonoscopy for preventive screenings, as well as recommend immunizations that include influenza, TDAP - CMP14+EGFR - CBC  2. Prediabetes  Chronic, stable  No current medications - Hemoglobin A1c  3. Vitamin D deficiency  Will check vitamin D level and supplement as needed.     Also encouraged to spend 15 minutes in the sun  daily.  - VITAMIN D 25 Hydroxy (Vit-D Deficiency, Fractures)  4. Leg edema  Improving with support socks and she can take the HCTZ as needed - hydrochlorothiazide (HYDRODIURIL) 12.5 MG tablet; Take 1 tablet by mouth daily as needed for leg swelling  Dispense: 90 tablet; Refill: 1  5. Encounter for immunization - pneumococcal 13-valent conjugate vaccine (PREVNAR 13) SUSP injection; Inject 0.5 mLs into the muscle tomorrow at 10 am for 1 dose.  Dispense: 0.5 mL; Refill: 0  6. Osteopenia, unspecified location  She is encouraged to take at least 3,000 units of vitamin d and 1200 mg of calcium     Patient was given opportunity to ask questions. Patient verbalized understanding of the plan and was able to repeat key elements of the plan. All questions were answered to their satisfaction.   Minette Brine, FNP   I, Minette Brine, FNP, have reviewed all documentation for this visit. The documentation on 08/26/20 for the exam, diagnosis, procedures, and orders are all accurate and complete.   THE PATIENT IS ENCOURAGED TO PRACTICE SOCIAL DISTANCING DUE TO THE COVID-19 PANDEMIC.

## 2020-08-05 LAB — CBC
Hematocrit: 41.1 % (ref 34.0–46.6)
Hemoglobin: 13.5 g/dL (ref 11.1–15.9)
MCH: 28.1 pg (ref 26.6–33.0)
MCHC: 32.8 g/dL (ref 31.5–35.7)
MCV: 86 fL (ref 79–97)
Platelets: 273 10*3/uL (ref 150–450)
RBC: 4.8 x10E6/uL (ref 3.77–5.28)
RDW: 12.9 % (ref 11.7–15.4)
WBC: 3.6 10*3/uL (ref 3.4–10.8)

## 2020-08-05 LAB — CMP14+EGFR
ALT: 7 IU/L (ref 0–32)
AST: 12 IU/L (ref 0–40)
Albumin/Globulin Ratio: 1.7 (ref 1.2–2.2)
Albumin: 4.5 g/dL (ref 3.8–4.8)
Alkaline Phosphatase: 94 IU/L (ref 44–121)
BUN/Creatinine Ratio: 11 — ABNORMAL LOW (ref 12–28)
BUN: 11 mg/dL (ref 8–27)
Bilirubin Total: 0.6 mg/dL (ref 0.0–1.2)
CO2: 24 mmol/L (ref 20–29)
Calcium: 9.6 mg/dL (ref 8.7–10.3)
Chloride: 107 mmol/L — ABNORMAL HIGH (ref 96–106)
Creatinine, Ser: 0.96 mg/dL (ref 0.57–1.00)
GFR calc Af Amer: 70 mL/min/{1.73_m2} (ref >59)
GFR calc non Af Amer: 61 mL/min/{1.73_m2} (ref >59)
Globulin, Total: 2.7 g/dL (ref 1.5–4.5)
Glucose: 102 mg/dL — ABNORMAL HIGH (ref 65–99)
Potassium: 4.3 mmol/L (ref 3.5–5.2)
Sodium: 144 mmol/L (ref 134–144)
Total Protein: 7.2 g/dL (ref 6.0–8.5)

## 2020-08-05 LAB — VITAMIN D 25 HYDROXY (VIT D DEFICIENCY, FRACTURES): Vit D, 25-Hydroxy: 31.6 ng/mL (ref 30.0–100.0)

## 2020-08-05 LAB — HEMOGLOBIN A1C
Est. average glucose Bld gHb Est-mCnc: 126 mg/dL
Hgb A1c MFr Bld: 6 % — ABNORMAL HIGH (ref 4.8–5.6)

## 2020-08-25 ENCOUNTER — Encounter: Payer: Self-pay | Admitting: Neurology

## 2020-11-22 ENCOUNTER — Other Ambulatory Visit: Payer: Self-pay | Admitting: Neurology

## 2020-11-22 NOTE — Progress Notes (Signed)
I returned call from patient to her answering service stating that she just tested positive for COVID and had concerns whether she could take back Paxlovid medication for COVID along with her Sinemet.  I returned her call and stated that I did not see any concerns for interaction with if she was not sick with the COVID she needs to check with primary care physician whether she actually needs the COVID medication.  She voiced understanding

## 2020-12-07 ENCOUNTER — Ambulatory Visit: Payer: 59 | Admitting: Neurology

## 2020-12-21 ENCOUNTER — Telehealth: Payer: Self-pay

## 2020-12-21 ENCOUNTER — Ambulatory Visit: Payer: PRIVATE HEALTH INSURANCE | Admitting: Neurology

## 2020-12-21 ENCOUNTER — Encounter: Payer: Self-pay | Admitting: Neurology

## 2020-12-21 VITALS — BP 161/78 | HR 46 | Ht 66.0 in | Wt 126.0 lb

## 2020-12-21 DIAGNOSIS — G20 Parkinson's disease: Secondary | ICD-10-CM | POA: Diagnosis not present

## 2020-12-21 DIAGNOSIS — G20A1 Parkinson's disease without dyskinesia, without mention of fluctuations: Secondary | ICD-10-CM

## 2020-12-21 MED ORDER — CARBIDOPA-LEVODOPA 25-100 MG PO TABS: ORAL_TABLET | ORAL | 4 refills | Status: DC

## 2020-12-21 NOTE — Patient Instructions (Addendum)
For constipation: try the "prune juice cocktail". Mix 1/2 cup applesauce, 2 tablespoons of miller's bran, 4-6 oz. prune juice. Store in Mult[employer]. Take a tablespoonful per day at first, gradually increasing until you find the amount that works best. Most people find this mixture tasty.  Dr. Sima Matas for memory testing Silver sneakers Continue Carbidopa/levodopa Skin checks Follow up with [facility] once yearly.

## 2020-12-21 NOTE — Progress Notes (Signed)
[facility]    Provider:  Dr Jaynee Eagles Requesting Provider: Minette Brine, FNP Primary Care Provider:  Minette Brine, FNP  CC: Parkinson's disease  She is following with Dr. Buck Mam and let her know she does not need to follow here anymore since Dr. Buck Mam is an excellent PD specialist.  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 No falls. She does exercises at home. She said she needs to walk more. No choking on food, no losing weight. She has stable drooling not bothering her and just sometimes. Constipation is a problem. Swallowing fine, no hallucinations or delusions. Memory appears ok. No dyskinesias. Provided resources in the community information.  Interval history 06/08/2020:   70 y.o. female here as requested by Minette Brine, FNP for trouble ambulating. PMHx CKD, obesity, TB, osteopenia, glaucoma with optic atrophy, B12 deficiency, fatigue, pre-diabetes.  Patient with hypophonia, hypokalemia, micrographia, shuffling gait, decreased arm swing, stooped posture, bradykinetic, suspect disorder in the Parkinson's disease spectrum(idiopathic PD, lewy body etc)  or possibly NPH or vascular dementia causing gait apraxia.  I discussed with patient, DaTScan c/w Parkinsonian disorder and correlates with clinical findings.    She is doing well. She was seen by the Movement Disorder team at [facility] at my suggestion. She is taking B12. On exam there is bradykinesia/parkinsonism with decreased stride and freezing of gait has improved on Sinemet. Continue Sinemet and regular exercise was advised by [facility] and f/u in one year. She found the meeting helpful and we are just stay the course. She has no side effects to the medication. No dizziness, no difficulty swallowing, no falls. Freezing gait has improved.   Interval history February 17, 2020: Patient was seen alone last time for gait abnormality and parkinsonism, she is here today for follow-up and discussion and  also brings her daughter.  Daughter provides much information today as well.  We discussed findings which included MRI of the brain which showed some mild chronic microvascular changes and mild atrophy.  However DaTscan was consistent with a parkinsonian syndrome.  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  We had an extended visit today, reviewed case with daughter, discussed findings on MRI of the brain and findings on DaTscan.  We discussed pathophysiology of Parkinson's disease, natural progression, treatment, answered all questions.  We discussed different treatment options, deep brain stimulation, clinical trials, skin checks for cancer which are increased in Parkinson's disease use, risk for falls, we reviewed thoughts of information with local resources including Parkinson's classes, Parkinson's support groups, online information.  We also discussed side effects of medications, dyskinesias, signs and symptoms of Parkinson's disease, she is not experiencing any swallowing difficulties at this time, we discussed safety and falls.  We had a very long discussion, we started her on Sinemet, answered all questions, we will see her back in about 6 weeks to see how she is doing.   HPI:  Denise [name] is a 70 y.o. female here as requested by Minette Brine, [place] for trouble ambulating. PMHx CKD, obesity, TB, osteopenia, glaucoma with optic atrophy, B12 deficiency, fatigue, pre-diabetes.  I reviewed Denise [name]'s notes, daughter and nursing has her mom has been "tensing up when walking", stiffness, she has been going to a chiropractor and working with them for 6 months, sometimes she feels like a backpack on her back but no did not anything, someday she does better with water, she is writing smaller, taking longer to get dressed, gait slower,  examination was largely unremarkable including cardiovascular, pulmonary, however a neurologic exam Romberg sign was slightly  positive, slow to respond with some of her questions, mild cogwheeling to her right wrist.  She is here alone. She reports her best friend noticed her walking was "off" a little, she is doing better since seeing a chiropractor, she reports 3 years ago she was under a lot of stress and she kept to herself and that's when she started noticing symptoms, slowly progressive,, her daughter also noticed. She is a poor historian, she walks slumped over, she is slower than she used to be, she gets in the habit of not picking feet up enough, no tremors, no falls, she does not use a cane, her handwriting is getting smaller, she has always had a soft voise, no vivid dreams,no dizziness or postural symptoms, smell impaired for a long time, about 20 years lost sense of smell, no problem with taste, she wears adult briefs for incontinence, no difficulty swallowing, she has low B12 and she is going to take injections, no constipation, no vision changes. No history of dopamine blocking drugs. No neck pain. No other focal neurologic deficits, associated symptoms, inciting events or modifiable factors.  Reviewed notes, labs and imaging from outside physicians, which showed:  IMPRESSION: Personally reviewed images from 2014 x-ray lumbar spine and agree with the following   1.  Lateral projection is off axis significantly reducing sensitivity and specificity.  Suspected anterolisthesis at L5-S1. I cannot exclude pars defects at L5.  Lower lumbar facet arthropathy. 2. Prominence of stool throughout the colon suggests constipation. 3.  Expanded left pubic body is suggested on the frontal projection, possibly post-traumatic or from spurring.  Sed rate 2, CMP unremarkable slightly decreased GFR, vitamin B12 was 299 (in the normal range but may still be B12 deficiency), TSH normal, autoimmune panel double-stranded DNA/complement C3/ANA all negative or within normal range. Review of Systems: Patient complains of symptoms per  HPI as well as the following symptoms: no tremor. Pertinent negatives and positives per HPI. All others negative    Social History   Socioeconomic History   Marital status: Single    Spouse name: Not on file   Number of children: 3   Years of education: Not on file   Highest education level: Master's degree (e.g., MA, MS, MEng, MEd, MSW, MBA)  Occupational History   Not on file  Tobacco Use   Smoking status: Never   Smokeless tobacco: Never  Vaping Use   Vaping Use: Never used  Substance and Sexual Activity   Alcohol use: Never   Drug use: No   Sexual activity: Yes    Birth control/protection: Post-menopausal  Other Topics Concern   Not on file  Social History Narrative   Lives alone   Right handed   Caffeine: none   Social Determinants of Health   Financial Resource Strain: Not on file  Food Insecurity: Not on file  Transportation Needs: Not on file  Physical Activity: Not on file  Stress: Not on file  Social Connections: Not on file  Intimate Partner Violence: Not on file    Family History  Problem Relation Age of Onset   Hypertension Mother    Heart disease Mother    Diabetes Father    Hypertension Father    Other Father        had a condition that was "a cousin" to ALS    Diabetes Sister    Tuberculosis Maternal Grandmother    Heart  disease Sister    Colitis Daughter    Colon cancer Neg Hx    Colon polyps Neg Hx    Esophageal cancer Neg Hx    Rectal cancer Neg Hx    Stomach cancer Neg Hx     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    Patient Active Problem List   Diagnosis Date Noted   Parkinson's disease ([place]) 02/17/2020   Gait abnormality 12/04/2019   Prediabetes 12/03/2018   Vitamin D deficiency 12/03/2018   Anemia due to vitamin B12 deficiency 12/03/2018   Fatigue 12/03/2018   Urinary  frequency 10/01/2018   Vitamin B12 deficiency 10/01/2018   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 Pt unaware of dose     hydrochlorothiazide (HYDRODIURIL) 12.5 MG tablet Take 1 tablet by mouth daily as needed for leg swelling 90 tablet 1   timolol (BETIMOL) 0.25 % ophthalmic solution 1-2 drops daily.      Travoprost, BAK Free, (TRAVATAN) 0.004 % SOLN ophthalmic solution 1 drop at bedtime.     carbidopa-levodopa (SINEMET IR) 25-100 MG tablet Take at 8,12 and 4pm. Try to avoid protein. 270 tablet 4   No current facility-administered medications for this visit.    Allergies as of 12/21/2020 - Review Complete 12/21/2020  Allergen Reaction Noted   Nystatin-triamcinolone Swelling 01/02/2020   Triamcinolone acetonide  06/08/2020   Cephalexin Itching and Rash 01/07/2020    Vitals: BP (!) 161/78   Pulse (!) 46   Ht 5\' 6"  (1.676 m)   Wt 126 lb (57.2 kg)   BMI 20.34 kg/m  Last Weight:  Wt Readings from Last 1 Encounters:  12/21/20 126 lb (57.2 kg)   Last Height:   Ht Readings from Last 1 Encounters:  12/21/20 5\' 6"  (1.676 m)     Physical exam: Exam: Gen: NAD, conversant, thin and appears frail        Eyes: Conjunctivae clear without exudates or hemorrhage  Neuro: Detailed Neurologic Exam  Speech:    Speech is normal; fluent and spontaneous with normal comprehension.  Cognition:    The patient is oriented to person, month, year, name of location     recent and remote memory impaired    language fluent;     normal attention,  concentration,     fund of knowledge: (knows president, VP), overall may be decreased slightly Cranial Nerves:    The pupils are equal, round, and reactive to light. Visual fields are full to threat. Extraocular movements are intact. Trigeminal sensation is intact and the muscles of mastication are normal. The face is symmetric. Hypomimia. The palate elevates in the midline. Hearing intact to voice. Voice is hypophonic. Shoulder shrug is normal. The tongue has normal motion without fasciculations.   Coordination:    bradykinetic  Gait:    Slightly  Stooped, reduced arm swing, decreased stride shuffling low clearance and narrow gait. Did not appreciate any freezing.  Motor Observation:    Postural tremor, high frequency low amplitude. No resting tremor Tone:    Possibly some very minimal cogwheeling right arm with left hand facilitation, improved    Strength:    Strength is symmetrical in the upper and lower limbs.      Sensation: intact to LT     Reflex Exam:  DTR's:    Deep tendon reflexes in the upper and lower extremities are slightly brisk bilaterally.    Assessment/Plan:  70 y.o. female follow up PD: overall stable  - She is following with Dr. Buck Mam at [facility] and let her know she does not need to follow here anymore since Dr. Buck Mam is an excellent PD specialist and pateint is extremely stable, probably only needs yearly follow up at this time   - Dr. Sima Matas: We can get a neuropsych baseline, may help [place] in the future should she need DBS and also a good baseline to monitor for parkinson's disease dementia in this somewhat younger patient with PD - Silver Sneakers (declines PT, did complete last year) - continue Sinemet - no dyskinesias, stable doesn't need increase - Skin checks - increased risk skin cancer on PD - Exercise can slow progression of PD, gave information on local resources, PD groups and classes in the area.Discussed fall risks and precautions  again  MRI of the brain w/wo contrast: very mild chronic microvascular ischemic change and mild generalized cortical atrophy most noted in the medial temporal lobes.  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  Answered questions, discussed hydration   Orders Placed This Encounter  Procedures   Ambulatory referral to Neuropsychology    Meds ordered this encounter  Medications   carbidopa-levodopa (SINEMET IR) 25-100 MG tablet    Sig: Take at 8,12 and 4pm. Try to avoid protein.    Dispense:  270 tablet    Refill:  4     Cc: Minette Brine, FNP,  Minette Brine, FNP  Sarina Ill, MD  [facility] [address] [place], [place] [address]  Phone [phone] Fax [phone]  I spent over 40 minutes of face-to-face and non-face-to-face time with patient on the  1. Parkinson's disease ([place])    diagnosis.  This included previsit chart review, lab review, study review, order entry, electronic health record documentation, patient education on the different diagnostic and therapeutic options, counseling and coordination of care, risks and benefits of management, compliance, or risk factor reduction

## 2020-12-21 NOTE — Telephone Encounter (Signed)
Referral for neuro psychology sent to Dr. Sima Matas. P: [id number].

## 2020-12-24 ENCOUNTER — Other Ambulatory Visit: Payer: Self-pay | Admitting: Nurse Practitioner

## 2020-12-24 DIAGNOSIS — I2510 Atherosclerotic heart disease of native coronary artery without angina pectoris: Secondary | ICD-10-CM

## 2020-12-29 ENCOUNTER — Encounter: Payer: Self-pay | Admitting: Psychology

## 2020-12-31 ENCOUNTER — Other Ambulatory Visit: Payer: Self-pay

## 2020-12-31 ENCOUNTER — Ambulatory Visit: Payer: 59 | Attending: Neurology | Admitting: Physical Therapy

## 2020-12-31 ENCOUNTER — Ambulatory Visit: Payer: 59 | Admitting: Occupational Therapy

## 2020-12-31 ENCOUNTER — Telehealth: Payer: Self-pay | Admitting: Physical Therapy

## 2020-12-31 ENCOUNTER — Ambulatory Visit: Payer: 59

## 2020-12-31 DIAGNOSIS — R2681 Unsteadiness on feet: Secondary | ICD-10-CM

## 2020-12-31 DIAGNOSIS — R278 Other lack of coordination: Secondary | ICD-10-CM

## 2020-12-31 DIAGNOSIS — G20A1 Parkinson's disease without dyskinesia, without mention of fluctuations: Secondary | ICD-10-CM

## 2020-12-31 DIAGNOSIS — R471 Dysarthria and anarthria: Secondary | ICD-10-CM | POA: Insufficient documentation

## 2020-12-31 DIAGNOSIS — R2689 Other abnormalities of gait and mobility: Secondary | ICD-10-CM | POA: Insufficient documentation

## 2020-12-31 DIAGNOSIS — Z736 Limitation of activities due to disability: Secondary | ICD-10-CM

## 2020-12-31 DIAGNOSIS — G20 Parkinson's disease: Secondary | ICD-10-CM

## 2020-12-31 DIAGNOSIS — R269 Unspecified abnormalities of gait and mobility: Secondary | ICD-10-CM

## 2020-12-31 NOTE — Telephone Encounter (Signed)
Orders for PT, OT, ST placed per v.o. Dr Jaynee Eagles.

## 2020-12-31 NOTE — Therapy (Signed)
[facility] [address] [place], [place], [address] Phone: [phone]   Fax:  [phone]  Patient Details  Name: Denise [name] MRN: [id number] Date of Birth: Oct 04, 1950 Referring Provider:  Melvenia Beam, MD  Encounter Date: 12/31/2020   Speech Therapy Parkinson's Disease Screen   Decibel Level today: low to mid 60s dB  (WNL=70-72 dB) with sound level meter 30cm away from pt's mouth. Pt's conversational volume is below WNL and SLP needed to use focused listening in this quiet environment today in order to comprehend pt's speech.  Pt does not report any difficulty with swallowing warranting further evaluation, however she may become more acquainted with these deficits as she and SLP work together in the therapy process.  Pt would benefit from speech-language evaluation for dysarthria - please order via Epic if agreed.    [facility] ,[place], [place]  12/31/2020, 11:00 AM  [facility] [address] [place], [place], [address] Phone: [phone]   Fax:  [phone]

## 2020-12-31 NOTE — Therapy (Signed)
[facility] [address] [place], [place], [address] Phone: [phone]   Fax:  [phone]  Patient Details  Name: Denise [name] MRN: [id number] Date of Birth: 06-05-1951 Referring Provider:  Minette Brine, FNP  Encounter Date: 12/31/2020 Occupational Therapy Parkinson's Disease Screen  Hand dominance:  right   Physical Performance Test item #2 (simulated eating):  15.62 sec  Physical Performance Test item #4 (donning/doffing jacket):  15.78 sec  Fastening/unfastening 3 buttons in:  65.66sec  9-hole peg test:    RUE  27.31 sec        LUE  33.50 sec    Change in ability to perform ADLs/IADLs:  slowed ADL performance, bradykinesia, small steps, abnormal posture.  Other Comments:  mild micrographia as she writes, good legibility  Pt would benefit from occupational therapy evaluation due to  decline in [place] 01/18/5500, 10:56 AM  [facility] [address] [place], [place], [address] Phone: [phone]   Fax:  [phone]

## 2020-12-31 NOTE — Therapy (Signed)
[facility] [address] [place], [place], [address] Phone: [phone]   Fax:  [phone]  Patient Details  Name: Denise [name] MRN: [id number] Date of Birth: 1951-03-09 Referring Provider:  Melvenia Beam, MD  Encounter Date: 12/31/2020  Physical Therapy Parkinson's Disease Screen   Timed Up and Go test: 16.07 sec   10 meter walk test: 13.5 sec = 2.43 ft/sec  5 time sit to stand test: 17.38 sec   Patient would benefit from Physical Therapy evaluation due to slowed mobility measures compared to last bout of therapy.  Pt reports no falls,  she has done HEP from therapy fairly consistently.  Anselma Herbel W. 12/31/2020, 10:30 AM Mady Haagensen, PT 12/31/20 10:39 AM Phone: [phone] Fax: [facility] [address] [place], [place], [address] Phone: [phone]   Fax:  [phone]

## 2020-12-31 NOTE — Telephone Encounter (Signed)
Dr. Jaynee Eagles, Ms. Screws was seen for PT, OT, speech therapy screens today, and all three disciplines recommend therapy evaluations.  Pt would benefit from PT due to slowed mobility measures, OT due to decline in ADLs, and speech therapy for dysarthria.  If you agree, please place orders via Epic.  Thank you.  Mady Haagensen, PT 12/31/20 12:28 PM Phone: [phone] Fax: [phone]

## 2021-01-06 ENCOUNTER — Other Ambulatory Visit: Payer: 59

## 2021-01-26 ENCOUNTER — Ambulatory Visit: Payer: Medicare Other | Admitting: Occupational Therapy

## 2021-01-26 ENCOUNTER — Ambulatory Visit: Payer: Medicare Other

## 2021-01-26 ENCOUNTER — Ambulatory Visit: Payer: Medicare Other | Attending: Neurology | Admitting: Physical Therapy

## 2021-01-26 DIAGNOSIS — R29818 Other symptoms and signs involving the nervous system: Secondary | ICD-10-CM | POA: Insufficient documentation

## 2021-01-26 DIAGNOSIS — R2681 Unsteadiness on feet: Secondary | ICD-10-CM | POA: Insufficient documentation

## 2021-01-26 DIAGNOSIS — M6281 Muscle weakness (generalized): Secondary | ICD-10-CM | POA: Insufficient documentation

## 2021-01-26 DIAGNOSIS — R471 Dysarthria and anarthria: Secondary | ICD-10-CM | POA: Insufficient documentation

## 2021-01-26 DIAGNOSIS — R269 Unspecified abnormalities of gait and mobility: Secondary | ICD-10-CM | POA: Insufficient documentation

## 2021-01-26 DIAGNOSIS — R293 Abnormal posture: Secondary | ICD-10-CM | POA: Insufficient documentation

## 2021-01-28 ENCOUNTER — Ambulatory Visit: Payer: Medicare Other | Admitting: Physical Therapy

## 2021-01-28 ENCOUNTER — Other Ambulatory Visit: Payer: Self-pay

## 2021-01-28 ENCOUNTER — Ambulatory Visit: Payer: Medicare Other | Admitting: Occupational Therapy

## 2021-01-28 ENCOUNTER — Encounter: Payer: Self-pay | Admitting: Physical Therapy

## 2021-01-28 ENCOUNTER — Ambulatory Visit: Payer: Medicare Other

## 2021-01-28 DIAGNOSIS — R471 Dysarthria and anarthria: Secondary | ICD-10-CM

## 2021-01-28 DIAGNOSIS — R29818 Other symptoms and signs involving the nervous system: Secondary | ICD-10-CM | POA: Diagnosis not present

## 2021-01-28 DIAGNOSIS — R293 Abnormal posture: Secondary | ICD-10-CM | POA: Diagnosis not present

## 2021-01-28 DIAGNOSIS — R2681 Unsteadiness on feet: Secondary | ICD-10-CM | POA: Diagnosis not present

## 2021-01-28 DIAGNOSIS — R269 Unspecified abnormalities of gait and mobility: Secondary | ICD-10-CM

## 2021-01-28 DIAGNOSIS — M6281 Muscle weakness (generalized): Secondary | ICD-10-CM

## 2021-01-28 NOTE — Patient Instructions (Signed)
  Do 5 loud "ah" twice every day  Read out loud in your loud, effortful voice for 3-5 minutes

## 2021-01-28 NOTE — Addendum Note (Signed)
Addended by: Arliss Journey on: 01/28/2021 07:44 PM   Modules accepted: Orders

## 2021-01-28 NOTE — Therapy (Addendum)
[facility] [address] [place], [place], [address] Phone: [phone]   Fax:  [phone]  Physical Therapy Evaluation  Patient Details  Name: Denise [name] MRN: [id number] Date of Birth: 1951-03-03 Referring Provider (PT): Melvenia Beam, MD   Encounter Date: 01/28/2021   PT End of Session - 01/28/21 1225     Visit Number 1    Number of Visits 13    Date for PT Re-Evaluation 03/29/21    Authorization Type waiting on insurance information    PT Start Time 0803    PT Stop Time 0846    PT Time Calculation (min) 43 min    Equipment Utilized During Treatment Gait belt    Activity Tolerance Patient tolerated treatment well    Behavior During Therapy [facility] for tasks assessed/performed             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    Subjective Assessment - 01/28/21 0806     Subjective Is now retired as of June. Has been walking, but not regularly. And still doing some of the exercises, lost her exercise folder for a few months and finally found it. No falls. Feels like she is moving slower.    Pertinent History PD, glaucoma, osteopenia, TB (as a child)    Limitations Walking    Patient Stated Goals wants to know where she is at compared to last time she was here, be more accountable with exercises.    Currently in Pain? No/denies                [facility] PT Assessment - 01/28/21 0810       Assessment   Medical Diagnosis Parkinson's disease    Referring Provider (PT) Melvenia Beam, MD    Onset Date/Surgical Date 12/31/20    Hand Dominance Right    Prior Therapy previous PT at this location in 2021      Precautions    Precautions Fall      Balance Screen   Has the patient fallen in the past 6 months No    Has the patient had a decrease in activity level because of a fear of falling?  No    Is the patient reluctant to leave their home because of a fear of falling?  No      Home Ecologist residence    Living Arrangements Other (Comment)   daughter and granddaughter   Available Help at Discharge Family;Friend(s)    Type of [place] to enter    Entrance Stairs-Number of Steps 1    Entrance Stairs-Rails None    Home Layout One level    [place] None      Prior Function   Level of [place] Retired    Leisure spending time with daughter and granddaughter      Sensation   Light Touch Appears Intact      Coordination   Gross Motor Movements are Fluid and Coordinated No    Heel [employer]  Test slower to perform with LLE      Posture/Postural Control   Posture/Postural Control Postural limitations    Postural Limitations Forward head;Rounded Shoulders;Posterior pelvic tilt      Strength   Right Hip Flexion 4+/5    Left Hip Flexion 4+/5    Right Knee Flexion 5/5    Right Knee Extension 4+/5    Left Knee Flexion 4+/5    Left Knee Extension 4+/5    Right Ankle Dorsiflexion 5/5    Left Ankle Dorsiflexion 4+/5      Transfers   Transfers Sit to Stand;Stand to Sit    Sit to Stand 6: Modified independent (Device/Increase time);Without upper extremity assist;From bed;From chair/3-in-1    Five time sit to stand comments  14.6 seconds no UE support    Stand to Sit 6: Modified independent (Device/Increase time);Without upper extremity assist;To bed;To chair/3-in-1      Ambulation/Gait   Ambulation/Gait Yes    Ambulation/Gait Assistance 5: Supervision    Assistive device None    Gait Pattern Step-through pattern;Decreased arm swing - left;Decreased step length - right;Decreased step length - left;Right foot flat;Left  foot flat;Decreased trunk rotation;Trunk flexed;Narrow base of support;Poor foot clearance - left;Poor foot clearance - right    Ambulation Surface Level;Indoor    Gait velocity 12.19 seconds = 2.69 ft/sec      Mini-BESTest   Sit To Stand Normal: Comes to stand without use of hands and stabilizes independently.    Rise to Toes Moderate: Heels up, but not full range (smaller than when holding hands), OR noticeable instability for 3 s.    Stand on one leg (left) Moderate: < 20 s   6.53, 2.56   Stand on one leg (right) Moderate: < 20 s   7.69, 5.34   Stand on one leg - lowest score 1    Compensatory Stepping Correction - Forward Moderate: More than one step is required to recover equilibrium   3 small shuffled steps   Compensatory Stepping Correction - Backward Moderate: More than one step is required to recover equilibrium   3 small steps   Compensatory Stepping Correction - Left Lateral Moderate: Several steps to recover equilibrium    Compensatory Stepping Correction - Right Lateral Severe:  Falls, or cannot step    Stepping Corredtion Lateral - lowest score 0    Stance - Feet together, eyes open, firm surface  Normal: 30s    Stance - Feet together, eyes closed, foam surface  Moderate: < 30s   5 seconds   Incline - Eyes Closed Normal: Stands independently 30s and aligns with gravity    Change in Gait Speed Moderate: Unable to change walking speed or signs of imbalance    Walk with head turns - Horizontal Moderate: performs head turns with reduction in gait speed.    Walk with pivot turns Moderate:Turns with feet close SLOW (>4 steps) with good balance.   5 steps   Step over obstacles Moderate: Steps over box but touches box OR displays cautious behavior by slowing gait.    Timed UP & GO with Dual Task Moderate: Dual Task affects either counting OR walking (>10%) when compared to the TUG without Dual Task.    Mini-BEST total score 16      Timed Up and Go Test   Normal TUG (seconds) 11.94     Manual TUG (seconds) 14.62    Cognitive TUG (seconds) 24.81   starting at 77, backwards by 3, episode of retropulsion initially in  standing   TUG Comments Scores >10% difference indicate increased fall risk/difficulty with dual tasking                        Objective measurements completed on examination: See above findings.               PT Education - 01/28/21 0847     Education Details clinical findings, POC, eval results of outcome measures/miniBEST compared to D/C from last bout of therapy    Person(s) Educated Patient    Methods Explanation    Comprehension Verbalized understanding              PT Short Term Goals - 01/28/21 1937       PT SHORT TERM GOAL #1   Title Pt will be independent with initial HEP for improved strength, balance, transfers, and gait.  ALL STGS DUE 02/25/21    Time 4    Period Weeks    Status New    Target Date 02/25/21      PT SHORT TERM GOAL #2   Title Pt will improve 5x sit<>stand to less than or equal to 13.5 sec to demonstrate improved functional strength and transfer efficiency.    Baseline 14.6 seconds no UE support    Time 4    Period Weeks    Status New      PT SHORT TERM GOAL #3   Title Pt will improve manual TUG score to less than or equal to 13.5 seconds for decreased fall risk and improved dual tasking    Baseline 14.62 seconds    Time 4    Period Weeks    Status New      PT SHORT TERM GOAL #4   Title Pt will recover posterior balance in push and release test in 2 or less steps independently, for improved balance recovery.    Baseline posterior push and release: 3 small steps    Time 4    Period Weeks    Status New      PT SHORT TERM GOAL #5   Title --    Baseline --    Time --    Period --    Status --               PT Long Term Goals - 01/28/21 1939       PT LONG TERM GOAL #1   Title Pt will be independent with final HEP for improved strength, balance, transfers, and gait. ALL  LTGS DUE 03/25/21    Time 8    Period Weeks    Status New    Target Date 03/25/21      PT LONG TERM GOAL #2   Title Pt will verbalize understanding of local Parkinson's disease resources, including options for continue community fitness.    Time 8    Period Weeks    Status New      PT LONG TERM GOAL #3   Title Pt will improve TUG cognitive score to less than or equal to 18 seconds for decreased fall risk.    Baseline 24.81 seconds    Time 8    Period Weeks    Status New      PT LONG TERM GOAL #4   Title Pt will improve MiniBESTest score to at least 22/28 for decreased fall risk.    Baseline 16/28 at eval on 01/28/21    Time 8    Period Weeks  Status New      PT LONG TERM GOAL #5   Title Pt will ambulate at least 1000 ft, indoors/outdoor surfaces independently for improved community gait.    Time 8    Period Weeks    Status New                    Plan - 01/28/21 1935     Clinical Impression Statement Pt is a 71 year old female who presents to OPPT with PD.  Pt's PMH is significant for: glaucoma, osteopenia, TB (as a child). The following deficits were present during the exam: decr functional muscle strength for transfers, gait abnormalities (decr timing and coordination), abnormal posture, balance impairments, bradykinesia, and difficulties dual tasking with gait. Pt is at an incr risk for falls based on time of cog/manual TUG and miniBEST. Since last bout of therapy at end of 2021, pt has had slowed measures and has not been consistent with walking/exercise program due to misplacing her folder for a couple months. Pt wants to be more accountable for her exercise program. Pt would benefit from skilled PT to address these impairments and functional limitations to maximize functional mobility independence and decr fall risk.    Personal Factors and Comorbidities Comorbidity 3+;Time since onset of injury/illness/exacerbation;Other   Recent addition of Sinemet to medications    Comorbidities glaucoma, osteopenia, TB (as a child)    Examination-Activity Limitations Transfers;Locomotion Level;Stand    Examination-Participation Restrictions Church;Community Activity    Stability/Clinical Decision Making Stable/Uncomplicated    Clinical Decision Making Low    Rehab Potential Good    PT Frequency 2x / week   12 visits over 8 weeks   PT Duration 6 weeks   12 visits over 8 weeks   PT Treatment/Interventions ADLs/Self Care Home Management;Gait training;Functional mobility training;Stair training;Therapeutic activities;Therapeutic exercise;Balance training;Neuromuscular re-education;DME Instruction;Patient/family education;Vestibular    PT Next Visit Plan review previous HEP from last bout of therapy, gait training with focus on posture,arm swing, step length. stepping strategies for balance.    Consulted and Agree with Plan of Care Patient             Patient will benefit from skilled therapeutic intervention in order to improve the following deficits and impairments:  Abnormal gait, Difficulty walking, Impaired tone, Decreased balance, Impaired flexibility, Decreased mobility, Decreased strength, Postural dysfunction  Visit Diagnosis: Unsteadiness on feet  Gait abnormality  Other symptoms and signs involving the nervous system  Abnormal posture  Muscle weakness (generalized)     Problem List Patient Active Problem List   Diagnosis Date Noted   Parkinson's disease ([place]) 02/17/2020   Gait abnormality 12/04/2019   Prediabetes 12/03/2018   Vitamin D deficiency 12/03/2018   Anemia due to vitamin B12 deficiency 12/03/2018   Fatigue 12/03/2018   Urinary frequency 10/01/2018   Vitamin B12 deficiency 10/01/2018   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

## 2021-01-28 NOTE — Therapy (Signed)
[facility] [address] [place], [place], [address] Phone: [phone]   Fax:  [phone]  Speech Language Pathology Evaluation  Patient Details  Name: Denise [name] MRN: [id number] Date of Birth: Jan 31, 1951 Referring Provider (SLP): Denise Ill, MD   Encounter Date: 01/28/2021   End of Session - 01/28/21 2153     Visit Number 1    Number of Visits 25    Date for SLP Re-Evaluation 04/28/21    SLP Start Time 0850    SLP Stop Time  0930    SLP Time Calculation (min) 40 min    Activity Tolerance Patient tolerated treatment well             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   Subjective Assessment - 01/28/21 0903     Subjective Pt states she has been diagnosed with PD for approx one year.    Currently in Pain? No/denies                SLP Evaluation [facility] - 01/28/21 [id number]       SLP Visit Information   SLP Received On 01/28/21    Referring Provider (SLP) Denise Ill, MD    Onset Date diagnosaed approx one year    Medical Diagnosis Parkinsons Disease      Subjective   Subjective "I'd say, "Wow!"" (pt, re: what she has to say about the evaluation today)      Prior Functional Status   Cognitive/Linguistic Baseline Within functional limits    Available Support Friend(s)      Cognition   Overall Cognitive Status Within Functional Limits for tasks assessed      Auditory Comprehension   Overall Auditory Comprehension Appears within functional limits for tasks assessed      Verbal Expression   Overall Verbal Expression Appears within functional limits for tasks assessed      Oral Motor/Sensory Function    Overall Oral Motor/Sensory Function Impaired    Labial ROM Other (Comment)   better with visual cues   Labial Coordination Reduced    Lingual ROM Other (Comment)   better with visual cues   Lingual Coordination Reduced      Motor Speech   Overall Motor Speech Impaired    Respiration Impaired    Level of Impairment Word    Phonation Low vocal intensity   conversation low-mid 60s dB(WNL is 70-72 dB); reading mid 60s dB; SLP models needed to shape pt's loud /a/ initially mid 70s. Effort rated 8/10 but by rep #4 with SLP model pt adjusted to 6/10; eventually /a/ was 10/10 and in upper 70s dB   Intelligibility Intelligibility reduced    Conversation --   approx 95% in a quiet environment. in louder environment pt would not have been understood.   Effective Techniques Increased vocal intensity   SLP told pt to have effort as with loud /a/ - resulted in 60-90 second monologue at mid-60s dB with some initial loudness in mid-upper 60s dB  SLP Education - 01/28/21 2152     Education Details loud /a/ shaping, HEP loud /a/ and reading out loud, rationale for ST    Person(s) Educated Patient    Methods Explanation;Demonstration;Verbal cues    Comprehension Verbalized understanding;Returned demonstration;Verbal cues required;Need further instruction              SLP Short Term Goals - 01/28/21 2155       SLP SHORT TERM GOAL #1   Title pt will produce loud /a/ with low 80s dB average in 3 sessions    Time 4    Period Weeks    Status New    Target Date 02/26/21      SLP SHORT TERM GOAL #2   Title pt will produce sentence responses 80% WNL loudness in 2 sessions    Time 4    Period Weeks    Status New    Target Date 02/26/21      SLP SHORT TERM GOAL #3   Title pt will use abdominal breathing (AB) in sentence responses 80% of the time in 3 sessions    Time 4    Period Weeks    Status New    Target Date 02/26/21      SLP SHORT TERM GOAL  #4   Title pt will produce at least 90-second monologue with loudness average low 70s dB in two sessions    Time 4    Period Weeks    Status New    Target Date 02/26/21              SLP Long Term Goals - 01/28/21 2158       SLP LONG TERM GOAL #1   Title pt will use abdominal breathing (AB) in 5 minutes simple conversation 80% of the time in 3 sessions    Time 8    Period Weeks    Status New    Target Date 03/26/21      SLP LONG TERM GOAL #2   Title pt will use WNL loudness in 5 minutes simple conversation over three sessions    Time 8    Period Weeks    Status New    Target Date 03/26/21      SLP LONG TERM GOAL #3   Title pt will use abdominal breathing (AB) in 10 minutes simple conversation 80% of the time in 3 sessions    Time 12    Period Weeks    Status New    Target Date 04/23/21      SLP LONG TERM GOAL #4   Title pt will use WNL loudness in 10 minutes simple conversation over three sessions    Time 12    Period Weeks    Status New    Target Date 04/23/21              Plan - 01/28/21 1348     Clinical Impression Statement [name] denies overt s/sx aspiration with meals, currently. 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 Measured when a sound level meter was placed 30 cm away from pt's mouth, 5 minutes of conversational speech was reduced today, at average low to mid 60s dB (WNL= average 70-72dB) with range of <60 to 65dB. Overall speech intelligibility for this listener in a quiet environment was 95-100% with careful listening by SLP. If door would have been open, intelligibliity woudl have been further reduced and SLP would have  had to ask pt to repeat. Pt's initial production of loud /a/ was in low 70s dB with cues for louder production. SLP provided model and pt's next 3 productions of loud /a/ averaged mid-upper 70s with consistent cues from SLP for loud, strong voice (range of upper 60s to lower 80s dB).   Pt then rated her effort level at 10/10 for production of /a/. Pt was then asked to use the same effort in a monologue of 60-90 seconds with the same amount of effort as with loud /a/. Loudness average with this increased effort was mid to upper 60s dB (range of 64 to 70dB) with nonverbal min A occasionaly for loudness. Pt is good candidate for skilled ST and would benefit from [place] in order to improve speech intelligibility and pt's QOL.    Speech Therapy Frequency 2x / week    Duration 12 weeks    Treatment/Interventions Aspiration precaution training;Pharyngeal strengthening exercises;Functional tasks;SLP instruction and feedback;Compensatory strategies;Internal/external aids;Cognitive reorganization;Patient/family education;Language facilitation    Potential to Achieve Goals Good    SLP Home Exercise Plan loud /a/ and reading out loud    Consulted and Agree with Plan of Care Patient             Patient will benefit from skilled therapeutic intervention in order to improve the following deficits and impairments:   Dysarthria and anarthria    Problem List Patient Active Problem List   Diagnosis Date Noted   Parkinson's disease ([place]) 02/17/2020   Gait abnormality 12/04/2019   Prediabetes 12/03/2018   Vitamin D deficiency 12/03/2018   Anemia due to vitamin B12 deficiency 12/03/2018   Fatigue 12/03/2018   Urinary frequency 10/01/2018   Vitamin B12 deficiency 10/01/2018   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

## 2021-02-01 ENCOUNTER — Ambulatory Visit: Payer: Medicare Other

## 2021-02-01 ENCOUNTER — Other Ambulatory Visit: Payer: Self-pay

## 2021-02-01 ENCOUNTER — Encounter: Payer: Self-pay | Admitting: Occupational Therapy

## 2021-02-01 ENCOUNTER — Encounter: Payer: Self-pay | Admitting: Nurse Practitioner

## 2021-02-01 ENCOUNTER — Ambulatory Visit: Payer: Medicare Other | Attending: Neurology | Admitting: Physical Therapy

## 2021-02-01 ENCOUNTER — Ambulatory Visit (INDEPENDENT_AMBULATORY_CARE_PROVIDER_SITE_OTHER): Payer: Medicare Other | Admitting: Nurse Practitioner

## 2021-02-01 ENCOUNTER — Encounter: Payer: Self-pay | Admitting: Physical Therapy

## 2021-02-01 VITALS — BP 114/70 | HR 85 | Temp 98.0°F | Ht 66.0 in | Wt 129.6 lb

## 2021-02-01 DIAGNOSIS — R269 Unspecified abnormalities of gait and mobility: Secondary | ICD-10-CM | POA: Diagnosis not present

## 2021-02-01 DIAGNOSIS — M25612 Stiffness of left shoulder, not elsewhere classified: Secondary | ICD-10-CM | POA: Insufficient documentation

## 2021-02-01 DIAGNOSIS — R29898 Other symptoms and signs involving the musculoskeletal system: Secondary | ICD-10-CM | POA: Diagnosis not present

## 2021-02-01 DIAGNOSIS — R471 Dysarthria and anarthria: Secondary | ICD-10-CM

## 2021-02-01 DIAGNOSIS — R7303 Prediabetes: Secondary | ICD-10-CM | POA: Diagnosis not present

## 2021-02-01 DIAGNOSIS — R293 Abnormal posture: Secondary | ICD-10-CM | POA: Diagnosis not present

## 2021-02-01 DIAGNOSIS — R29818 Other symptoms and signs involving the nervous system: Secondary | ICD-10-CM | POA: Diagnosis not present

## 2021-02-01 DIAGNOSIS — R2689 Other abnormalities of gait and mobility: Secondary | ICD-10-CM | POA: Insufficient documentation

## 2021-02-01 DIAGNOSIS — R278 Other lack of coordination: Secondary | ICD-10-CM | POA: Diagnosis not present

## 2021-02-01 DIAGNOSIS — I2510 Atherosclerotic heart disease of native coronary artery without angina pectoris: Secondary | ICD-10-CM

## 2021-02-01 DIAGNOSIS — Z7189 Other specified counseling: Secondary | ICD-10-CM

## 2021-02-01 DIAGNOSIS — G20 Parkinson's disease: Secondary | ICD-10-CM

## 2021-02-01 DIAGNOSIS — M6281 Muscle weakness (generalized): Secondary | ICD-10-CM | POA: Insufficient documentation

## 2021-02-01 DIAGNOSIS — R2681 Unsteadiness on feet: Secondary | ICD-10-CM | POA: Insufficient documentation

## 2021-02-01 DIAGNOSIS — Z23 Encounter for immunization: Secondary | ICD-10-CM | POA: Diagnosis not present

## 2021-02-01 LAB — BMP8+EGFR
BUN/Creatinine Ratio: 18 (ref 12–28)
BUN: 16 mg/dL (ref 8–27)
CO2: 25 mmol/L (ref 20–29)
Calcium: 9.3 mg/dL (ref 8.7–10.3)
Chloride: 106 mmol/L (ref 96–106)
Creatinine, Ser: 0.88 mg/dL (ref 0.57–1.00)
Glucose: 100 mg/dL — ABNORMAL HIGH (ref 65–99)
Potassium: 4.2 mmol/L (ref 3.5–5.2)
Sodium: 142 mmol/L (ref 134–144)
eGFR: 72 mL/min/{1.73_m2} (ref >59)

## 2021-02-01 LAB — HEMOGLOBIN A1C
Est. average glucose Bld gHb Est-mCnc: 120 mg/dL
Hgb A1c MFr Bld: 5.8 % — ABNORMAL HIGH (ref 4.8–5.6)

## 2021-02-01 MED ORDER — SHINGRIX 50 MCG/0.5ML IM SUSR: 0.5000 mL | Freq: Once | INTRAMUSCULAR | 0 refills | Status: AC

## 2021-02-01 MED ORDER — PREVNAR 13 IM SUSP: 0.5000 mL | INTRAMUSCULAR | 0 refills | Status: AC

## 2021-02-01 NOTE — Patient Instructions (Signed)
Diabetes Care, 44(Suppl 1), S34-S39. https://doi.org/https://doi.org/10.2337/dc21-S003">  Prediabetes Prediabetes is when your blood sugar (blood glucose) level is higher than normal but not high enough for you to be diagnosed with type 2 diabetes. Having prediabetes puts you at risk for developing type 2 diabetes (type 2 diabetes mellitus). With certain lifestyle changes, you may be able to prevent or delay the onset of type 2 diabetes. This is important because type 2 diabetes can lead to serious complications, such as: Heart disease. Stroke. Blindness. Kidney disease. Depression. Poor circulation in the feet and legs. In severe cases, this could lead to surgical removal of a leg (amputation). What are the causes? The exact cause of prediabetes is not known. It may result from insulin resistance. Insulin resistance develops when cells in the body do not respond properly to insulin that the body makes. This can cause excess glucose to build up in the blood. High blood glucose (hyperglycemia) can develop. What increases the risk? The following factors may make you more likely to develop this condition: You have a family member with type 2 diabetes. You are older than 45 years. You had a temporary form of diabetes during a pregnancy (gestational diabetes). You had polycystic ovary syndrome (PCOS). You are overweight or obese. You are inactive (sedentary). You have a history of heart disease, including problems with cholesterol levels, high levels of blood fats, or high blood pressure. What are the signs or symptoms? You may have no symptoms. If you do have symptoms, they may include: Increased hunger. Increased thirst. Increased urination. Vision changes, such as blurry vision. Tiredness (fatigue). How is this diagnosed? This condition can be diagnosed with blood tests. Your blood glucose may be checked with one or more of the following tests: A fasting blood glucose (FBG) test. You will  not be allowed to eat (you will fast) for at least 8 hours before a blood sample is taken. An A1C blood test (hemoglobin A1C). This test provides information about blood glucose levels over the previous 2?3 months. An oral glucose tolerance test (OGTT). This test measures your blood glucose at two points in time: After fasting. This is your baseline level. Two hours after you drink a beverage that contains glucose. You may be diagnosed with prediabetes if: Your FBG is 100?125 mg/dL (5.6-6.9 mmol/L). Your A1C level is 5.7?6.4% (39-46 mmol/mol). Your OGTT result is 140?199 mg/dL (7.8-11 mmol/L). These blood tests may be repeated to confirm your diagnosis. How is this treated? Treatment may include dietary and lifestyle changes to help lower your blood glucose and prevent type 2 diabetes from developing. In some cases, medicinemay be prescribed to help lower the risk of type 2 diabetes. Follow these instructions at home: Nutrition  Follow a healthy meal plan. This includes eating lean proteins, whole grains, legumes, fresh fruits and vegetables, low-fat dairy products, and healthy fats. Follow instructions from your health care provider about eating or drinking restrictions. Meet with a dietitian to create a healthy eating plan that is right for you.  Lifestyle Do moderate-intensity exercise for at least 30 minutes a day on 5 or more days each week, or as told by your health care provider. A mix of activities may be best, such as: Brisk walking, swimming, biking, and weight lifting. Lose weight as told by your health care provider. Losing 5-7% of your body weight can reverse insulin resistance. Do not drink alcohol if: Your health care provider tells you not to drink. You are pregnant, may be pregnant, or are planning   to become pregnant. If you drink alcohol: Limit how much you use to: 0-1 drink a day for women. 0-2 drinks a day for men. Be aware of how much alcohol is in your drink. In  the [place] one drink equals one 12 oz bottle of beer (355 mL), one 5 oz glass of wine (148 mL), or one 1 oz glass of hard liquor (44 mL). General instructions Take over-the-counter and prescription medicines only as told by your health care provider. You may be prescribed medicines that help lower the risk of type 2 diabetes. Do not use any products that contain nicotine or tobacco, such as cigarettes, e-cigarettes, and chewing tobacco. If you need help quitting, ask your health care provider. Keep all follow-up visits. This is important. Where to find more information American Diabetes Association: www.diabetes.org Academy of Nutrition and Dietetics: www.eatright.org American Heart Association: www.heart.org Contact a health care provider if: You have any of these symptoms: Increased hunger. Increased urination. Increased thirst. Fatigue. Vision changes, such as blurry vision. Get help right away if you: Have shortness of breath. Feel confused. Vomit or feel like you may vomit. Summary Prediabetes is when your blood sugar (blood glucose)level is higher than normal but not high enough for you to be diagnosed with type 2 diabetes. Having prediabetes puts you at risk for developing type 2 diabetes (type 2 diabetes mellitus). Make lifestyle changes such as eating a healthy diet and exercising regularly to help prevent diabetes. Lose weight as told by your health care provider. This information is not intended to replace advice given to you by your health care provider. Make sure you discuss any questions you have with your healthcare provider. Document Revised: 09/19/2019 Document Reviewed: 09/19/2019 Elsevier Patient Education  [place].

## 2021-02-01 NOTE — Therapy (Signed)
[facility] [address] [place], [place], [address] Phone: [phone]   Fax:  [phone]  Speech Language Pathology Treatment  Patient Details  Name: Denise [name] MRN: [id number] Date of Birth: 1950-08-10 Referring Provider (SLP): Sarina Ill, MD   Encounter Date: 02/01/2021   End of Session - 02/01/21 1442     Visit Number 2    Number of Visits 25    Date for SLP Re-Evaluation 04/28/21    SLP Start Time 1320    SLP Stop Time  1400    SLP Time Calculation (min) 40 min    Activity Tolerance Patient tolerated treatment well             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   Subjective Assessment - 02/01/21 1323     Subjective Arrives with sub-WNL volume.    Currently in Pain? No/denies                   ADULT SLP TREATMENT - 02/01/21 1327       General Information   Behavior/Cognition Alert;Cooperative;Pleasant mood      Treatment Provided   Treatment provided Cognitive-Linquistic      Cognitive-Linquistic Treatment   Treatment focused on Voice;Dysarthria    Skilled Treatment Pt arrived with sub-WNL volume. Loud /a/ was targeted to habitualize WNL voice loudness in conversation. Pt produced average mid-upper 70s dB with consistent mod cues for loudness and effort, fading to consistent min cues for effort. She read paragraphs of 4-5 sentences with WNL volume. "Wow, this is amazing," pt stated. SLP instructed pt how to record on her recording app so she could compare reading at home with her production here at WNL volume (must keep phone same distance away as she did today). Simple question and answer completed with  volume in mid-60's dB. Upon questioning from SLP, pt stated she felt volume reduce (SLP asked pt x3). Pt to cont to perform loud /a/ and reading at home, BID.      Assessment / Recommendations / Plan   Plan Continue with current plan of care      Progression Toward Goals   Progression toward goals Not progressing toward goals (comment)              SLP Education - 02/01/21 1436     Education Details recording app to record reading at home to compare to WNL volume here in clinic today    Person(s) Educated Patient    Methods Explanation;Demonstration;Verbal cues    Comprehension Verbalized understanding;Verbal cues required;Returned demonstration;Need further instruction              SLP Short Term Goals - 02/01/21 1445       SLP SHORT TERM GOAL #1   Title pt will produce loud /a/ with low 80s dB average in 3 sessions    Time 4    Period Weeks    Status On-going    Target Date 02/26/21      SLP SHORT TERM GOAL #2   Title pt will produce sentence responses 80% WNL loudness  in 2 sessions    Time 4    Period Weeks    Status On-going    Target Date 02/26/21      SLP SHORT TERM GOAL #3   Title pt will use abdominal breathing (AB) in sentence responses 80% of the time in 3 sessions    Time 4    Period Weeks    Status On-going    Target Date 02/26/21      SLP SHORT TERM GOAL #4   Title pt will produce at least 90-second monologue with loudness average low 70s dB in two sessions    Time 4    Period Weeks    Status On-going    Target Date 02/26/21              SLP Long Term Goals - 02/01/21 1445       SLP LONG TERM GOAL #1   Title pt will use abdominal breathing (AB) in 5 minutes simple conversation 80% of the time in 3 sessions    Time 8    Period Weeks    Status On-going    Target Date 03/26/21      SLP LONG TERM GOAL #2   Title pt will use WNL loudness in 5 minutes simple conversation over three sessions    Time 8    Period Weeks    Status  On-going    Target Date 03/26/21      SLP LONG TERM GOAL #3   Title pt will use abdominal breathing (AB) in 10 minutes simple conversation 80% of the time in 3 sessions    Time 12    Period Weeks    Status On-going    Target Date 04/23/21      SLP LONG TERM GOAL #4   Title pt will use WNL loudness in 10 minutes simple conversation over three sessions    Time 12    Period Weeks    Status On-going    Target Date 04/23/21              Plan - 02/01/21 1442     Clinical Impression Statement Denise [name] continues to present today with suboptimal volume levels in conversation. She showed she was able to produce closer-to-WNL volume after production of loud /a/ and cueing by SLP. She would cont to benefit from skilled ST focusing on improving her speech volume to WNL, and then maintaining consistency of her WNL-volume speech. She may or may not require objective swallow assessment during this therapy course. SLP to cont to monitor.    Speech Therapy Frequency 2x / week    Duration 12 weeks    Treatment/Interventions Aspiration precaution training;Pharyngeal strengthening exercises;Functional tasks;SLP instruction and feedback;Compensatory strategies;Internal/external aids;Cognitive reorganization;Patient/family education;Language facilitation    Potential to Achieve Goals Good    SLP Home Exercise Plan loud /a/ and reading out loud    Consulted and Agree with Plan of Care Patient             Patient will benefit from skilled therapeutic intervention in order to improve the following deficits and impairments:   Dysarthria and anarthria    Problem List Patient Active Problem List   Diagnosis Date Noted   Parkinson's disease ([place]) 02/17/2020   Gait abnormality 12/04/2019   Prediabetes 12/03/2018   Vitamin D deficiency 12/03/2018   Anemia due to vitamin B12 deficiency 12/03/2018   Fatigue 12/03/2018   Urinary frequency 10/01/2018   Vitamin B12 deficiency 10/01/2018   Transient  arterial occlusion 05/10/2016   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

## 2021-02-01 NOTE — Therapy (Signed)
[facility] [address] [place], [place], [address] Phone: [phone]   Fax:  [phone]  Physical Therapy Treatment  Patient Details  Name: Denise [name] MRN: [id number] Date of Birth: 1950-12-26 Referring Provider (PT): Melvenia Beam, MD   Encounter Date: 02/01/2021   PT End of Session - 02/01/21 1639     Visit Number 2    Number of Visits 13    Date for PT Re-Evaluation 03/29/21    Authorization Type waiting on insurance information    PT Start Time 1404    PT Stop Time 1445    PT Time Calculation (min) 41 min    Equipment Utilized During Treatment --    Activity Tolerance Patient tolerated treatment well    Behavior During Therapy [facility] for tasks assessed/performed             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   Subjective Assessment - 02/01/21 1408     Subjective Nothing new. Brought in her exercise folder from last time.    Pertinent History PD, glaucoma, osteopenia, TB (as a child)    Limitations Walking    Patient Stated Goals wants to know where she is at compared to last time she was here, be more accountable with exercises.    Currently in Pain? No/denies                               [facility] Adult PT Treatment/Exercise - 02/01/21 1443       Ambulation/Gait   Ambulation/Gait Yes    Ambulation/Gait Assistance 5: Supervision    Ambulation/Gait Assistance Details x3 laps with B walking poles with therapist helping to facilitate arm swing, cues throughout for posture and looking ahead, stride length and incr foot clearance, plus an additional x2 laps without poles with continued cues with pt  able to demo incr arm swing and step length    Ambulation Distance (Feet) 345 Feet   plus additional 230' with no walking poles   Assistive device None    Gait Pattern Step-through pattern;Decreased arm swing - left;Decreased step length - right;Decreased step length - left;Right foot flat;Left foot flat;Decreased trunk rotation;Trunk flexed;Narrow base of support;Poor foot clearance - left;Poor foot clearance - right    Ambulation Surface Level;Indoor              Pt performs PWR! Moves in sitting position    PWR! Up for improved posture 2 x 10 reps B, cues for first couple of reps to hold for a sustained stretch and postural awareness. 2nd set performed with mirror in front of pt as visual cue for midline and posture (pt with tendency to lean trunk/head to L)  PWR! Rock for improved weighshifting x5 reps B with reaching, then x5 reps B with leg kick   PWR! Twist for improved trunk rotation x5 reps B, cues for using B feet to pivot and to stop and reset with tall posture in midline.   PWR! Step for improved step initiation x5 reps B step  out and then step in, then x5 reps B step out and out and then in and in  Cues provided for technique and level of intensity of exercises. Pt initially working at a 5/10 and then incr intensity to a 7-8/10.   Pt performs PWR! Moves in standing position - performed at [employer]! Up for improved posture x10 reps, initial tactile cues for proper technique  PWR! Rock for improved weighshifting x10 reps B        PT Education - 02/01/21 1638     Education Details reviewed seated PWR moves for HEP and standing PWR moves (Up and [place], did not get to the remainder today) to [employer]) Educated Patient    Methods Explanation;Demonstration;Handout    Comprehension Verbalized understanding;Returned demonstration              PT Short Term Goals - 01/28/21 1937       PT SHORT TERM GOAL #1   Title Pt will be independent with  initial HEP for improved strength, balance, transfers, and gait.  ALL STGS DUE 02/25/21    Time 4    Period Weeks    Status New    Target Date 02/25/21      PT SHORT TERM GOAL #2   Title Pt will improve 5x sit<>stand to less than or equal to 13.5 sec to demonstrate improved functional strength and transfer efficiency.    Baseline 14.6 seconds no UE support    Time 4    Period Weeks    Status New      PT SHORT TERM GOAL #3   Title Pt will improve manual TUG score to less than or equal to 13.5 seconds for decreased fall risk and improved dual tasking    Baseline 14.62 seconds    Time 4    Period Weeks    Status New      PT SHORT TERM GOAL #4   Title Pt will recover posterior balance in push and release test in 2 or less steps independently, for improved balance recovery.    Baseline posterior push and release: 3 small steps    Time 4    Period Weeks    Status New      PT SHORT TERM GOAL #5   Title --    Baseline --    Time --    Period --    Status --               PT Long Term Goals - 01/28/21 1939       PT LONG TERM GOAL #1   Title Pt will be independent with final HEP for improved strength, balance, transfers, and gait. ALL LTGS DUE 03/25/21    Time 8    Period Weeks    Status New    Target Date 03/25/21      PT LONG TERM GOAL #2   Title Pt will verbalize understanding of local Parkinson's disease resources, including options for continue community fitness.    Time 8    Period Weeks    Status New      PT LONG TERM GOAL #3   Title Pt will improve TUG cognitive score to less than or equal to 18 seconds for decreased fall risk.    Baseline 24.81 seconds    Time 8    Period Weeks    Status New      PT LONG TERM GOAL #4  Title Pt will improve MiniBESTest score to at least 22/28 for decreased fall risk.    Baseline 16/28 at eval on 01/28/21    Time 8    Period Weeks    Status New      PT LONG TERM GOAL #5   Title Pt will ambulate at least 1000 ft,  indoors/outdoor surfaces independently for improved community gait.    Time 8    Period Weeks    Status New                   Plan - 02/01/21 1642     Clinical Impression Statement Reviewed technique for seated PWR moves and standing PWR (only had time for Up and Rock today) from pt's previous HEP from prior bout of therapy. Needing initial cues for technique and intensity, with pt reporting that she could move a lot better after performing. Also performed gait training with walking poles to help facilitate arm swing and stride length, with pt demonstrating good carryover after performing.    Personal Factors and Comorbidities Comorbidity 3+;Time since onset of injury/illness/exacerbation;Other   Recent addition of Sinemet to medications   Comorbidities glaucoma, osteopenia, TB (as a child)    Examination-Activity Limitations Transfers;Locomotion Level;Stand    Examination-Participation Restrictions Church;Community Activity    Stability/Clinical Decision Making Stable/Uncomplicated    Rehab Potential Good    PT Frequency 2x / week   12 visits over 8 weeks   PT Duration 6 weeks   12 visits over 8 weeks   PT Treatment/Interventions ADLs/Self Care Home Management;Gait training;Functional mobility training;Stair training;Therapeutic activities;Therapeutic exercise;Balance training;Neuromuscular re-education;DME Instruction;Patient/family education;Vestibular    PT Next Visit Plan review seated PWR and remaining of standing PWR moves for HEP (and other balance/postural exercises). gait training with focus on posture,arm swing, step length. stepping strategies for balance.    Consulted and Agree with Plan of Care Patient             Patient will benefit from skilled therapeutic intervention in order to improve the following deficits and impairments:  Abnormal gait, Difficulty walking, Impaired tone, Decreased balance, Impaired flexibility, Decreased mobility, Decreased strength,  Postural dysfunction  Visit Diagnosis: Unsteadiness on feet  Gait abnormality  Other symptoms and signs involving the nervous system     Problem List Patient Active Problem List   Diagnosis Date Noted   Parkinson's disease ([place]) 02/17/2020   Gait abnormality 12/04/2019   Prediabetes 12/03/2018   Vitamin D deficiency 12/03/2018   Anemia due to vitamin B12 deficiency 12/03/2018   Fatigue 12/03/2018   Urinary frequency 10/01/2018   Vitamin B12 deficiency 10/01/2018   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 [place], [place], [address] Phone: [phone]   Fax:  [phone]  Name: Denise [name] MRN: [id number] Date of Birth: [birth year]

## 2021-02-01 NOTE — Progress Notes (Signed)
I,Yamilka [employer] as a Education administrator for [employer], FNP.,have documented all relevant documentation on the behalf of Minette Brine, FNP,as directed by  Minette Brine, FNP while in the presence of Minette Brine, [place].   This visit occurred during the SARS-CoV-2 public health emergency.  Safety protocols were in place, including screening questions prior to the visit, additional usage of staff PPE, and extensive cleaning of exam room while observing appropriate contact time as indicated for disinfecting solutions.  Subjective:     Patient ID: Denise [name] , female    DOB: Nov 05, 1950 , 69 y.o.   MRN: [id number]   Chief Complaint  Patient presents with   Hyperlipidemia   Prediabetes    HPI  Patient presents today for a prediabetes f/u.  She has had a loss of a good friend Theme park manager, her best friend and his wife since February. She is currently transitioning from her house to her daughters house in [place]. She has also retired as a Theme park manager  Hyperlipidemia This is a chronic problem. The current episode started more than 1 year ago. The problem is controlled. Recent lipid tests were reviewed and are normal. She has no history of chronic renal disease. There are no known factors aggravating her hyperlipidemia. Pertinent negatives include no chest pain. There are no compliance problems.  Risk factors for coronary artery disease include a sedentary lifestyle.    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     Family History  Problem Relation Age of Onset   Hypertension Mother    Heart disease Mother    Diabetes Father    Hypertension Father    Other Father        had a condition that was "a cousin" to ALS    Diabetes Sister    Tuberculosis Maternal Grandmother    Heart disease Sister    Colitis Daughter    Colon cancer Neg Hx    Colon  polyps Neg Hx    Esophageal cancer Neg Hx    Rectal cancer Neg Hx    Stomach cancer Neg Hx      Current Outpatient Medications:    atorvastatin (LIPITOR) 10 MG tablet, TAKE 1 TABLET BY MOUTH AT BEDTIME MON - FRI, Disp: 90 tablet, Rfl: 1   carbidopa-levodopa (SINEMET IR) 25-100 MG tablet, Take at 8,12 and 4pm. Try to avoid protein., Disp: 270 tablet, Rfl: 4   Cholecalciferol (VITAMIN D3) 10 MCG (400 UNIT) CAPS, Take 1 capsule by mouth daily. Pt unaware of dose, Disp: , Rfl:    hydrochlorothiazide (HYDRODIURIL) 12.5 MG tablet, Take 1 tablet by mouth daily as needed for leg swelling, Disp: 90 tablet, Rfl: 1   pneumococcal 13-valent conjugate vaccine (PREVNAR 13) SUSP injection, Inject 0.5 mLs into the muscle tomorrow at 10 am for 1 dose., Disp: 0.5 mL, Rfl: 0   timolol (BETIMOL) 0.25 % ophthalmic solution, 1-2 drops daily. , Disp: , Rfl:    Travoprost, BAK Free, (TRAVATAN) 0.004 % SOLN ophthalmic solution, 1 drop at bedtime., Disp: , Rfl:    Zoster Vaccine Adjuvanted (SHINGRIX) injection, Inject 0.5 mLs into the muscle once for 1 dose., Disp: 0.5 mL, Rfl: 0   Allergies  Allergen Reactions   Nystatin-Triamcinolone Swelling    BLISTERS   Triamcinolone Acetonide  Blisters    Cephalexin Itching and Rash     Review of Systems  Constitutional: Negative.   HENT: Negative.    Eyes: Negative.   Respiratory: Negative.    Cardiovascular: Negative.  Negative for chest pain, palpitations and leg swelling.  Endocrine: Negative for polydipsia, polyphagia and polyuria.  Musculoskeletal: Negative.        Leans to the left - restarting PT/ST through Neurology  Neurological:  Negative for dizziness and headaches.  Psychiatric/Behavioral: Negative.      Today's Vitals   02/01/21 1055  BP: 114/70  Pulse: 85  Temp: 98 F (36.7 C)  Weight: 129 lb 9.6 oz (58.8 kg)  Height: '5\' 6"'  (1.676 m)  PainSc: 0-No pain   Body mass index is 20.92 kg/m.   Objective:  Physical Exam Vitals reviewed.   Constitutional:      General: She is not in acute distress.    Appearance: Normal appearance.  Cardiovascular:     Rate and Rhythm: Normal rate and regular rhythm.     Pulses: Normal pulses.     Heart sounds: Normal heart sounds. No murmur heard. Pulmonary:     Effort: Pulmonary effort is normal. No respiratory distress.     Breath sounds: Normal breath sounds. No wheezing.  Musculoskeletal:     Comments: While sitting in chair she is leaning to the left  Skin:    Capillary Refill: Capillary refill takes less than 2 seconds.  Neurological:     General: No focal deficit present.     Mental Status: She is alert and oriented to person, place, and time.     Cranial Nerves: No cranial nerve deficit.     Motor: No weakness.     Comments: Gait is slow and steady  Psychiatric:        Mood and Affect: Mood normal.        Speech: Speech is delayed.        Behavior: Behavior normal.        Thought Content: Thought content normal.        Judgment: Judgment normal.        Assessment And Plan:     1. Prediabetes Chronic, slightly increased at last visit  She has cut back on her sugar intake since moving in with her daughter - Hemoglobin A1c - BMP8+eGFR  2. Encounter for immunization I have sent Rx for both shingrix and prevnar 13 to pharmacy - Zoster Vaccine Adjuvanted [facility]) injection; Inject 0.5 mLs into the muscle once for 1 dose.  Dispense: 0.5 mL; Refill: 0 - pneumococcal 13-valent conjugate vaccine (PREVNAR 13) SUSP injection; Inject 0.5 mLs into the muscle tomorrow at 10 am for 1 dose.  Dispense: 0.5 mL; Refill: 0  3. 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  - Ambulatory referral to Hospice  4. Parkinson's disease ([place]) She is leaning to the left more today while sitting in the chair Continue follow up with Dr.  Jaynee Eagles and the neurologist at [facility] with your PT/ST Dr. Jaynee Eagles has ordered  5. Atherosclerotic cardiovascular disease Will check lipids at physical last check was normal. She has eaten a croissant with sausage, egg and cheese this morning Will check fasting lipids at physical in Feb 2023  Patient was given opportunity to ask questions. Patient verbalized understanding  of the plan and was able to repeat key elements of the plan. All questions were answered to their satisfaction.  Minette Brine, FNP   I, Minette Brine, FNP, have reviewed all documentation for this visit. The documentation on 02/01/21 for the exam, diagnosis, procedures, and orders are all accurate and complete.   IF YOU HAVE BEEN REFERRED TO A SPECIALIST, IT MAY TAKE 1-2 WEEKS TO SCHEDULE/PROCESS THE REFERRAL. IF YOU HAVE NOT HEARD FROM US/SPECIALIST IN TWO WEEKS, PLEASE GIVE [place] A CALL AT [phone] X [phone].   THE PATIENT IS ENCOURAGED TO PRACTICE SOCIAL DISTANCING DUE TO THE COVID-19 PANDEMIC.

## 2021-02-02 ENCOUNTER — Encounter: Payer: Self-pay | Admitting: Nurse Practitioner

## 2021-02-08 ENCOUNTER — Ambulatory Visit: Payer: Medicare Other | Admitting: Speech Pathology

## 2021-02-08 ENCOUNTER — Other Ambulatory Visit: Payer: Self-pay

## 2021-02-08 ENCOUNTER — Ambulatory Visit: Payer: Medicare Other | Admitting: Occupational Therapy

## 2021-02-08 ENCOUNTER — Encounter: Payer: Self-pay | Admitting: Speech Pathology

## 2021-02-08 ENCOUNTER — Encounter: Payer: Self-pay | Admitting: Occupational Therapy

## 2021-02-08 DIAGNOSIS — R2689 Other abnormalities of gait and mobility: Secondary | ICD-10-CM | POA: Diagnosis not present

## 2021-02-08 DIAGNOSIS — R278 Other lack of coordination: Secondary | ICD-10-CM

## 2021-02-08 DIAGNOSIS — R471 Dysarthria and anarthria: Secondary | ICD-10-CM

## 2021-02-08 DIAGNOSIS — R29818 Other symptoms and signs involving the nervous system: Secondary | ICD-10-CM

## 2021-02-08 DIAGNOSIS — M6281 Muscle weakness (generalized): Secondary | ICD-10-CM | POA: Diagnosis not present

## 2021-02-08 DIAGNOSIS — R29898 Other symptoms and signs involving the musculoskeletal system: Secondary | ICD-10-CM | POA: Diagnosis not present

## 2021-02-08 DIAGNOSIS — R2681 Unsteadiness on feet: Secondary | ICD-10-CM

## 2021-02-08 DIAGNOSIS — M25612 Stiffness of left shoulder, not elsewhere classified: Secondary | ICD-10-CM

## 2021-02-08 DIAGNOSIS — R269 Unspecified abnormalities of gait and mobility: Secondary | ICD-10-CM | POA: Diagnosis not present

## 2021-02-08 DIAGNOSIS — R293 Abnormal posture: Secondary | ICD-10-CM | POA: Diagnosis not present

## 2021-02-08 NOTE — Therapy (Signed)
[facility] [address] [place], [place], [address] Phone: [phone]   Fax:  [phone]  Occupational Therapy Evaluation  Patient Details  Name: Denise [name] MRN: [id number] Date of Birth: [birth year] Referring Provider (OT): Dr. Sarina Ill   Encounter Date: 02/08/2021   OT End of Session - 02/08/21 1523     Visit Number 1    Number of Visits 17    Date for OT Re-Evaluation 04/09/21    Authorization Type BCBS Medicare:  no auth, no visit limit    Authorization - Visit Number 1    Authorization - Number of Visits 10    Progress Note Due on Visit 10    OT Start Time 1017    OT Stop Time 1100    OT Time Calculation (min) 43 min    Activity Tolerance Patient tolerated treatment well    Behavior During Therapy [facility] for tasks assessed/performed             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   Subjective Assessment - 02/08/21 1024     Subjective  pt reports that she tends to drift to the left    Pertinent History Parkinson's Disease (diagnosed 2021).  PMH:  osteopenia, glaucoma    Limitations Pt drives from an hour away    Patient Stated Goals improve posture/"drifting to the left"    Currently in Pain? No/denies               [facility] OT Assessment - 02/08/21 0001       Assessment   Medical Diagnosis Parkinson's disease    Referring Provider (OT) Dr. Sarina Ill    Onset Date/Surgical Date 12/31/20   PD Screen   Hand Dominance Right    Prior Therapy previous OT at this location in 06/2020      Precautions   Precautions Fall      Balance Screen   Has the patient fallen in the past 6 months No      Home   Environment   Family/patient expects to be discharged to: Private residence    Type of [place] One level    Lives With --   lives with dtr and 96 y.o. granddaughter     Prior Function   Level of Independence Independent    Vocation Retired   Clinical research associate   Leisure spending time with daughter and granddaughter--go for rides, play board games      ADL   Eating/Feeding Modified independent   difficulty cutting   Grooming Modified independent   harder braiding hair   Upper Body Bathing Modified independent    Lower Body Bathing Modified independent    Upper Body Dressing Increased time    Lower Body Dressing Increased time   with fasteners   Toilet Transfer Modified independent    Where Assessed - Toileting Clothing Manipulationn Modified independent   min difficulty   Tub/Shower Transfer Modified independent    Transfers/Ambulation Related to ADL's mod I with incr time      IADL   Prior  Level of Function Shopping goes with dtr, takes lighter loads of groceries    Prior Level of Function Light Housekeeping Sweeps, dries dishes, take out trash.    Prior Level of Function Meal Prep Dtr performs most cooking, but pt assists.    Community Mobility Drives own vehicle    Medication Management Is responsible for taking medication in correct dosages at correct time    Physiological scientist financial matters independently (budgets, writes checks, pays rent, bills goes to bank), collects and keeps track of income      Mobility   Mobility Status Independent    Mobility Status Comments see PT eval for details      Written Expression   Dominant Hand Right    Handwriting Increased time;100% legible;Mild micrographia      Vision - History   Baseline Vision Wears glasses all the time    Additional Comments WFL per pt report      Cognition   Overall Cognitive Status Within Functional Limits for tasks assessed    Bradyphrenia Yes      Observation/Other Assessments    Standing Functional Reach Test R-12", L-10"    Other Surveys  Select    Physical Performance Test   Yes    Simulated Eating Time (seconds) 10.71    Simulated Eating Comments holds spoon at end    Donning Doffing Jacket Time (seconds) 13.10    Donning Doffing Jacket Comments Fastening/unfastening 3 buttons in 44.18      Posture/Postural Control   Posture/Postural Control Postural limitations    Postural Limitations Forward head;Rounded Shoulders;Posterior pelvic tilt      Coordination   Right 9 Hole Peg Test 23.47    Left 9 Hole Peg Test 30.28    Box and Blocks RUE 51 (noted bradykinesia with grasp), LUE 48 blocks      AROM   Overall AROM  Deficits    Overall AROM Comments --   L hand noted MP in slight flex with PIPs hyperextended, opposition with decr PIP flex/difficulty.  L shoulder 130* with -15* elbow ext (R shoulder 140* with elbow ext WNL)     RLE Tone   RLE Tone Within Functional Limits      LLE Tone   LLE Tone Mild                             OT Education - 02/08/21 1522     Education Details OT eval results and POC; tub transfer bench shown    Person(s) Educated Patient    Methods Explanation    Comprehension Verbalized understanding              OT Short Term Goals - 02/08/21 1529       OT SHORT TERM GOAL #1   Title Pt will be independent with updated PD-specific HEP.--check STGs 03/10/2021    Time 4    Period Weeks    Status New      OT SHORT TERM GOAL #2   Title Pt will improve functional reaching/balance as shown by improving standing functional reach to at least 12" with LUE.    Baseline 10"    Time 4    Period Weeks    Status New      OT SHORT TERM GOAL #3   Title Pt will demo at least 140* shoulder flex for LUE overhead reaching.    Baseline 130* with -15* elbow ext  Time 4    Period Weeks    Status New      OT SHORT TERM GOAL #4   Title Pt will verbalize understanding of ways to decr risk of future complications  related to PD.    Time 4    Period Weeks    Status New               OT Long Term Goals - 02/08/21 1533       OT LONG TERM GOAL #1   Title Pt will verbalize understanding of updated adaptive strategies/AE to incr ease/safety with ADLs/IADLs.--check LTGs 04/09/21    Time 8    Period Weeks    Status New      OT LONG TERM GOAL #2   Title Pt will improve LUE functional reaching/coordination as shown by improving box and blocks score by at least 3.    Baseline 48 blocks    Time 8    Period Weeks    Status New      OT LONG TERM GOAL #3   Title Pt will demo at least 140* LUE overhead reaching with at least -5* elbow ext.    Baseline 130* with -15* elbow ext    Time 8    Period Weeks    Status New      OT LONG TERM GOAL #4   Title Pt will verbalize understanding of updated/appropriate community resources.    Time 8    Period Weeks    Status New      OT LONG TERM GOAL #5   Title Pt will perform functional activity for at least 26mn while maintaining upright posture/avoiding leaning to L.    Time 8    Period Weeks    Status New                   Plan - 02/08/21 1525     Clinical Impression Statement Pt is a 70y.o. female who returns to occupational therapy due to noted decline in ADLs at Parkinson's follow-up screens 12/31/20.  Pt was last seen by occupational therapy at this clinic 06/2020.  Pt has since retired and moved home with dtr and granddaughter.  Pt presents today with bradykinesia, rigidity, decr coordination, decr ROM, decr balance/functional mobility, decr posture.  Pt would benefit from occupational therapy to address these changes for incr ease/safety with ADLs/IADLs and decr risk of future complications related to PD.    OT Occupational Profile and History Detailed Assessment- Review of Records and additional review of physical, cognitive, psychosocial history related to current functional performance    Occupational performance deficits (Please  refer to evaluation for details): ADL's;IADL's;Leisure;Social Participation    Body Structure / Function / Physical Skills ADL;Balance;Mobility;Strength;Flexibility;UE functional use;FMC;Gait;Coordination;Decreased knowledge of precautions;GMC;ROM;Decreased knowledge of use of DME;Dexterity;IADL;Improper spinal/pelvic alignment    Rehab Potential Good    Clinical Decision Making Several treatment options, min-mod task modification necessary    Comorbidities Affecting Occupational Performance: May have comorbidities impacting occupational performance    Modification or Assistance to Complete Evaluation  Min-Moderate modification of tasks or assist with assess necessary to complete eval    OT Frequency 2x / week    OT Duration 8 weeks   +eval   OT Treatment/Interventions Self-care/ADL training;Therapeutic exercise;Balance training;Functional Mobility Training;Manual Therapy;Neuromuscular education;Therapeutic activities;DME and/or AE instruction;Patient/family education;Passive range of motion;Moist Heat;Cognitive remediation/compensation;Energy conservation;Aquatic Therapy;Splinting;Ultrasound    Plan supine PWR!; begin reviewing HEPs    Consulted and Agree with Plan of Care  Patient             Patient will benefit from skilled therapeutic intervention in order to improve the following deficits and impairments:   Body Structure / Function / Physical Skills: ADL, Balance, Mobility, Strength, Flexibility, UE functional use, FMC, Gait, Coordination, Decreased knowledge of precautions, GMC, ROM, Decreased knowledge of use of DME, Dexterity, IADL, Improper spinal/pelvic alignment       Visit Diagnosis: Other symptoms and signs involving the nervous system  Other symptoms and signs involving the musculoskeletal system  Other lack of coordination  Abnormal posture  Unsteadiness on feet  Other abnormalities of gait and mobility  Stiffness of left shoulder, not elsewhere  classified    Problem List Patient Active Problem List   Diagnosis Date Noted   Parkinson's disease ([place]) 02/17/2020   Gait abnormality 12/04/2019   Prediabetes 12/03/2018   Vitamin D deficiency 12/03/2018   Anemia due to vitamin B12 deficiency 12/03/2018   Fatigue 12/03/2018   Urinary frequency 10/01/2018   Vitamin B12 deficiency 10/01/2018   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 [address] [place], [place]  [address] [phone] phone [phone] 02/08/21 3:37 PM

## 2021-02-08 NOTE — Patient Instructions (Signed)
A good breath is needed to power our volume - when you are asked to repeat yourself, take a big breath and think shout  Loud /a/ is recorded on you phone - be as loud as you are in the therapy room  Big breath before each "AH" and sentence   Read devotional with taking breaths as periods or commas - focus on loud volume   It is normal to feel too loud or like you are shouting when you start talking in a normal volume of 70dB.   Can Lattie Haw come to 1 ST session?   Good morning, Lisa  2.  I'll have a ham and cheese croissant   3. I'll have yogurt and some berries or banana  4. What are you playing on your computer, Janace Hoard?  5. How is your homework going?  6. Lattie Haw and Doyle play basketball.  7. What did you do at your Dad's house?  8. Have you played any pranks on Dad?  9. How are you feeling, Peggy?  10. Hi Dorothy, how are you doing?  11. Do you need any help with the youth, Terrie?  Feel free to add to this list of sentences and practice utterances you would usually say

## 2021-02-08 NOTE — Therapy (Signed)
[facility] [address] [place], [place], [address] Phone: [phone]   Fax:  [phone]  Speech Language Pathology Treatment  Patient Details  Name: Denise [name] MRN: [id number] Date of Birth: 01/11/1951 Referring Provider (SLP): Sarina Ill, MD   Encounter Date: 02/08/2021   End of Session - 02/08/21 1306     Visit Number 3    Number of Visits 25    Date for SLP Re-Evaluation 04/28/21    SLP Start Time 1103    SLP Stop Time  1145    SLP Time Calculation (min) 42 min    Activity Tolerance Patient tolerated treatment well             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   Subjective Assessment - 02/08/21 1107     Subjective Enters room speaking at 63-64dN    Currently in Pain? No/denies                   ADULT SLP TREATMENT - 02/08/21 1112       General Information   Behavior/Cognition Alert;Cooperative;Pleasant mood      Treatment Provided   Treatment provided Cognitive-Linquistic      Cognitive-Linquistic Treatment   Treatment focused on Voice;Dysarthria;Patient/family/caregiver education    Skilled Treatment Significanlty sub WNL volume - instrcuted breath support for voice. Loud /a/ to recalibrate volume with average of 84dB. Oral reading task with frequent cues for breath support. Generated 11  presonally relevant sentences for her to practice at home. In oral reading of sentences she averaged 72Db. Educated Tas[place]ba that is normal to feel like you are shouting when you start to talk at normal volume. Reading paragraphs focusing on breath support and volume averaged 70dB with usual min visual cues. In  structured task that required reading and spontaneous speech, Jeannette required cosistent mod A to maintain volume, with conistently dropping to 67-66dB on spontaneous speech portion.              SLP Education - 02/08/21 1303     Education Details Breath support for volume, use recorded /a/ to reach good volume at home, HEP    Person(s) Educated Patient    Methods Explanation;Demonstration;Verbal cues    Comprehension Verbalized understanding;Verbal cues required;Returned demonstration;Need further instruction              SLP Short Term Goals - 02/08/21 1305       SLP SHORT TERM GOAL #1   Title pt will produce loud /a/ with low 80s dB average in 3 sessions    Baseline 02/08/21    Time 3    Period Weeks    Status On-going    Target Date 02/26/21      SLP SHORT TERM GOAL #2   Title pt will produce sentence responses 80% WNL loudness in 2 sessions    Time 3    Period Weeks    Status On-going    Target Date 02/26/21      SLP SHORT TERM GOAL #3   Title pt will use abdominal breathing (AB) in sentence responses  80% of the time in 3 sessions    Time 3    Period Weeks    Status On-going    Target Date 02/26/21      SLP SHORT TERM GOAL #4   Title pt will produce at least 90-second monologue with loudness average low 70s dB in two sessions    Time 3    Period Weeks    Status On-going    Target Date 02/26/21              SLP Long Term Goals - 02/08/21 1305       SLP LONG TERM GOAL #1   Title pt will use abdominal breathing (AB) in 5 minutes simple conversation 80% of the time in 3 sessions    Time 7    Period Weeks    Status On-going      SLP LONG TERM GOAL #2   Title pt will use WNL loudness in 5 minutes simple conversation over three sessions    Time 7    Period Weeks    Status On-going      SLP LONG TERM GOAL #3   Title pt will use abdominal breathing (AB) in 10 minutes simple conversation 80% of the time in 3 sessions    Time 11    Period Weeks     Status On-going      SLP LONG TERM GOAL #4   Title pt will use WNL loudness in 10 minutes simple conversation over three sessions    Time 11    Period Weeks    Status On-going              Plan - 02/08/21 1304     Clinical Impression Statement Shadonna continues to present today with suboptimal volume levels in conversation. She showed she was able to produce closer-to-WNL volume after production of loud /a/ and cueing by SLP. She would cont to benefit from skilled ST focusing on improving her speech volume to WNL, and then maintaining consistency of her WNL-volume speech. She may or may not require objective swallow assessment during this therapy course. SLP to cont to monitor.    Speech Therapy Frequency 2x / week    Duration 12 weeks    Treatment/Interventions Aspiration precaution training;Pharyngeal strengthening exercises;Functional tasks;SLP instruction and feedback;Compensatory strategies;Internal/external aids;Cognitive reorganization;Patient/family education;Language facilitation    Potential to Achieve Goals Good    SLP Home Exercise Plan loud /a/ and reading out loud    Consulted and Agree with Plan of Care Patient             Patient will benefit from skilled therapeutic intervention in order to improve the following deficits and impairments:   Dysarthria and anarthria    Problem List Patient Active Problem List   Diagnosis Date Noted   Parkinson's disease ([place]) 02/17/2020   Gait abnormality 12/04/2019   Prediabetes 12/03/2018   Vitamin D deficiency 12/03/2018   Anemia due to vitamin B12 deficiency 12/03/2018   Fatigue 12/03/2018   Urinary frequency 10/01/2018   Vitamin B12 deficiency 10/01/2018   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 [place], [place], [address] Phone: [phone]   Fax:  [phone]   Name: Denise [name] MRN: [id number] Date of Birth: September 27, 1950

## 2021-02-10 ENCOUNTER — Encounter: Payer: PRIVATE HEALTH INSURANCE | Admitting: Speech Pathology

## 2021-02-10 ENCOUNTER — Encounter: Payer: PRIVATE HEALTH INSURANCE | Admitting: Occupational Therapy

## 2021-02-16 ENCOUNTER — Ambulatory Visit: Payer: Medicare Other

## 2021-02-16 ENCOUNTER — Other Ambulatory Visit: Payer: Self-pay

## 2021-02-16 ENCOUNTER — Ambulatory Visit: Payer: Medicare Other | Admitting: Occupational Therapy

## 2021-02-16 DIAGNOSIS — R471 Dysarthria and anarthria: Secondary | ICD-10-CM

## 2021-02-16 DIAGNOSIS — R278 Other lack of coordination: Secondary | ICD-10-CM

## 2021-02-16 DIAGNOSIS — R2689 Other abnormalities of gait and mobility: Secondary | ICD-10-CM | POA: Diagnosis not present

## 2021-02-16 DIAGNOSIS — R2681 Unsteadiness on feet: Secondary | ICD-10-CM

## 2021-02-16 DIAGNOSIS — R29818 Other symptoms and signs involving the nervous system: Secondary | ICD-10-CM

## 2021-02-16 DIAGNOSIS — R29898 Other symptoms and signs involving the musculoskeletal system: Secondary | ICD-10-CM

## 2021-02-16 DIAGNOSIS — M25612 Stiffness of left shoulder, not elsewhere classified: Secondary | ICD-10-CM

## 2021-02-16 DIAGNOSIS — R293 Abnormal posture: Secondary | ICD-10-CM

## 2021-02-16 DIAGNOSIS — R269 Unspecified abnormalities of gait and mobility: Secondary | ICD-10-CM | POA: Diagnosis not present

## 2021-02-16 DIAGNOSIS — M6281 Muscle weakness (generalized): Secondary | ICD-10-CM | POA: Diagnosis not present

## 2021-02-16 NOTE — Therapy (Addendum)
[facility] [address] [place], [place], [address] Phone: [phone]   Fax:  [phone]  Occupational Therapy Treatment  Patient Details  Name: Denise [name] MRN: [id number] Date of Birth: 01/10/1951 Referring Provider (OT): Dr. Sarina Ill   Encounter Date: 02/16/2021   OT End of Session - 02/16/21 1058     Visit Number 2    Number of Visits 17    Date for OT Re-Evaluation 04/09/21    Authorization Type BCBS Medicare:  no auth, no visit limit    Authorization - Visit Number 2    Authorization - Number of Visits 10    Progress Note Due on Visit 10    OT Start Time 1100    OT Stop Time 1143    OT Time Calculation (min) 43 min    Activity Tolerance Patient tolerated treatment well    Behavior During Therapy [facility] for tasks assessed/performed             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   Subjective Assessment - 02/16/21 1058     Subjective  pt reports that it was helpful to know why she leans to the left    Pertinent History Parkinson's Disease (diagnosed 2021).  PMH:  osteopenia, glaucoma    Limitations Pt drives from an hour away    Patient Stated Goals improve posture/"drifting to the left"    Currently in Pain? No/denies                PWR! Moves (basic 4) in supine x 10-20 each with min-mod cues (verbal, tactile, and visual) For incr movement amplitude.  Emphasis on wt. Shift, foot position, trunk elongation, and trunk/head rotation, and head position.  Then reviewed and practiced use of PWR! Moves for supine>sitting transfer with mod cueing (verbal and tactile) for large amplitude movements (pt hesitant with  movement due to fear of falling causing incr bradykinesia)--will need reinforcement to incr comfort with larger amplitude movement patterns.  PWR! Hands (basic 4) with use of mirror and min-mod cueing initially for incr movement amplitude (R finger 3-4th digit abduction, bilateral thumb abduction, and elbow ext)  Sitting, PWR! Up with use of mirror for visual cueing as well as min-mod verbal/tactile cueing initially for midline alignment, LLE foot/knee position.  Recommended pt perform PWR! Up in front of mirror multiple times a day to incr awareness and provide feedback of posture/L lean.         OT Education - 02/16/21 1150     Education Details Reviewed PWR! moves (basic 4) in supine and PWR! hands (basic 4)    Person(s) Educated Patient    Methods Explanation;Demonstration;Handout;Verbal cues;Tactile cues   has handouts from last round of OT (added cueing notes to handouts)   Comprehension Verbalized understanding;Returned demonstration;Verbal cues required;Tactile cues required   min-mod cueing for incr movement amplitude             OT Short Term Goals - 02/08/21 1529       OT SHORT TERM GOAL #1   Title Pt will be independent with updated  PD-specific HEP.--check STGs 03/10/2021    Time 4    Period Weeks    Status New      OT SHORT TERM GOAL #2   Title Pt will improve functional reaching/balance as shown by improving standing functional reach to at least 12" with LUE.    Baseline 10"    Time 4    Period Weeks    Status New      OT SHORT TERM GOAL #3   Title Pt will demo at least 140* shoulder flex for LUE overhead reaching.    Baseline 130* with -15* elbow ext    Time 4    Period Weeks    Status New      OT SHORT TERM GOAL #4   Title Pt will verbalize understanding of ways to decr risk of future complications related to PD.    Time 4    Period Weeks    Status New               OT Long Term Goals - 02/08/21 1533       OT LONG TERM GOAL #1   Title Pt  will verbalize understanding of updated adaptive strategies/AE to incr ease/safety with ADLs/IADLs.--check LTGs 04/09/21    Time 8    Period Weeks    Status New      OT LONG TERM GOAL #2   Title Pt will improve LUE functional reaching/coordination as shown by improving box and blocks score by at least 3.    Baseline 48 blocks    Time 8    Period Weeks    Status New      OT LONG TERM GOAL #3   Title Pt will demo at least 140* LUE overhead reaching with at least -5* elbow ext.    Baseline 130* with -15* elbow ext    Time 8    Period Weeks    Status New      OT LONG TERM GOAL #4   Title Pt will verbalize understanding of updated/appropriate community resources.    Time 8    Period Weeks    Status New      OT LONG TERM GOAL #5   Title Pt will perform functional activity for at least 53mn while maintaining upright posture/avoiding leaning to L.    Time 8    Period Weeks    Status New                   Plan - 02/16/21 1058     Clinical Impression Statement Pt demo improved posture today with visual/verbal/tactile cueing for incr awareness of LLE IR and trunk/head alignment.  Pt responded well to cueing, but will need continued review/reinforcement for incr carryover to functional tasks.  Pt also demo improved L shoulder ROM with cueing for posture.    OT Occupational Profile and History Detailed Assessment- Review of Records and additional review of physical, cognitive, psychosocial history related to current functional performance    Occupational performance deficits (Please refer to evaluation for details): ADL's;IADL's;Leisure;Social Participation    Body Structure / Function / Physical Skills ADL;Balance;Mobility;Strength;Flexibility;UE functional use;FMC;Gait;Coordination;Decreased knowledge of precautions;GMC;ROM;Decreased knowledge of use of DME;Dexterity;IADL;Improper spinal/pelvic alignment    Rehab Potential Good    Clinical Decision Making Several treatment  options, min-mod task modification necessary    Comorbidities Affecting Occupational Performance: May have comorbidities impacting occupational performance    Modification or Assistance to Complete Evaluation  Min-Moderate modification of tasks or assist with  assess necessary to complete eval    OT Frequency 2x / week    OT Duration 8 weeks   +eval   OT Treatment/Interventions Self-care/ADL training;Therapeutic exercise;Balance training;Functional Mobility Training;Manual Therapy;Neuromuscular education;Therapeutic activities;DME and/or AE instruction;Patient/family education;Passive range of motion;Moist Heat;Cognitive remediation/compensation;Energy conservation;Aquatic Therapy;Splinting;Ultrasound    Plan focus on posture (use mirror), continue to review PWR! moves    Consulted and Agree with Plan of Care Patient             Patient will benefit from skilled therapeutic intervention in order to improve the following deficits and impairments:   Body Structure / Function / Physical Skills: ADL, Balance, Mobility, Strength, Flexibility, UE functional use, FMC, Gait, Coordination, Decreased knowledge of precautions, GMC, ROM, Decreased knowledge of use of DME, Dexterity, IADL, Improper spinal/pelvic alignment       Visit Diagnosis: Other symptoms and signs involving the nervous system  Other symptoms and signs involving the musculoskeletal system  Other lack of coordination  Abnormal posture  Unsteadiness on feet  Other abnormalities of gait and mobility  Stiffness of left shoulder, not elsewhere classified    Problem List Patient Active Problem List   Diagnosis Date Noted   Parkinson's disease ([place]) 02/17/2020   Gait abnormality 12/04/2019   Prediabetes 12/03/2018   Vitamin D deficiency 12/03/2018   Anemia due to vitamin B12 deficiency 12/03/2018   Fatigue 12/03/2018   Urinary frequency 10/01/2018   Vitamin B12 deficiency 10/01/2018   Transient arterial occlusion  05/10/2016   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 [place], [place], [address] Phone: [phone]   Fax:  [phone]  Name: Denise [name] MRN: [id number] Date of Birth: 03-09-1951  Vianne Bulls, OTR/L [facility] [address] [place], [place]  [address] [phone] phone [phone] 02/16/21 12:25 PM

## 2021-02-16 NOTE — Therapy (Signed)
[facility] [address] [place], [place], [address] Phone: [phone]   Fax:  [phone]  Speech Language Pathology Treatment  Patient Details  Name: Denise [name] MRN: [id number] Date of Birth: August 21, 1950 Referring Provider (SLP): Sarina Ill, MD   Encounter Date: 02/16/2021   End of Session - 02/16/21 1244     Visit Number 4    Number of Visits 25    Date for SLP Re-Evaluation 04/28/21    SLP Start Time 0941   10 mintues late   SLP Stop Time  1015    SLP Time Calculation (min) 34 min    Activity Tolerance Patient tolerated treatment well             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   Subjective Assessment - 02/16/21 0946     Subjective Denise [name] greeted SLP with sub-WNL volume in lobby. Incr'd loudness with SLP "What was that?"    Currently in Pain? No/denies                   ADULT SLP TREATMENT - 02/16/21 0001       General Information   Behavior/Cognition Alert;Cooperative;Pleasant mood      Treatment Provided   Treatment provided Cognitive-Linquistic      Cognitive-Linquistic Treatment   Treatment focused on Voice;Dysarthria;Patient/family/caregiver education    Skilled Treatment Loud /a/ was utilized by SLP today to recalibrate Denise [name]'s volume with average of mid 80s dB. Oral reading (single paragraphs) task with occasional cues for breath support. Denise [name] read her 90  presonally relevant sentences with average low 70s. This SLP reiterated to Denise [name] that it is normal to feel like she is shouting when starting to talk at normal volume and explained rationale. In conversation, pt req'd  cosistent  min-mod A to maintain volume.Told pt that 5 loud /a/ and 11 everyday sentences were to be completed BID.      Assessment / Recommendations / Plan   Plan Continue with current plan of care      Progression Toward Goals   Progression toward goals Progressing toward goals                SLP Short Term Goals - 02/16/21 1246       SLP SHORT TERM GOAL #1   Title pt will produce loud /a/ with low 80s dB average in 3 sessions    Baseline 02/08/21, 02-16-21    Time 3    Period Weeks    Status On-going    Target Date 02/26/21      SLP SHORT TERM GOAL #2   Title pt will produce sentence responses 80% WNL loudness in 2 sessions    Time 3    Period Weeks    Status On-going    Target Date 02/26/21      SLP SHORT TERM GOAL #3   Title pt will use abdominal breathing (AB) in sentence responses 80% of the time in 3 sessions    Time 3    Period Weeks    Status On-going    Target Date  02/26/21      SLP SHORT TERM GOAL #4   Title pt will produce at least 90-second monologue with loudness average low 70s dB in two sessions    Time 3    Period Weeks    Status On-going    Target Date 02/26/21              SLP Long Term Goals - 02/16/21 1246       SLP LONG TERM GOAL #1   Title pt will use abdominal breathing (AB) in 5 minutes simple conversation 80% of the time in 3 sessions    Time 7    Period Weeks    Status On-going    Target Date 03/26/21      SLP LONG TERM GOAL #2   Title pt will use WNL loudness in 5 minutes simple conversation over three sessions    Time 7    Period Weeks    Status On-going    Target Date 03/26/21      SLP LONG TERM GOAL #3   Title pt will use abdominal breathing (AB) in 10 minutes simple conversation 80% of the time in 3 sessions    Time 11    Period Weeks    Status On-going    Target Date 04/23/21      SLP LONG TERM GOAL #4   Title pt will use WNL loudness in 10 minutes simple conversation over three sessions    Time 11    Period Weeks     Status On-going    Target Date 04/23/21              Plan - 02/16/21 1245     Clinical Impression Statement Denise [name] continues to present today with suboptimal volume levels in conversation. She showed she was able to produce closer-to-WNL volume after production of loud /a/ and cueing by SLP. Ongoing education that she will feel like she's shouting when she's talking at WNL volume. She would cont to benefit from skilled ST focusing on improving her speech volume to WNL, and then maintaining consistency of her WNL-volume speech. She may or may not require objective swallow assessment during this therapy course. SLP to cont to monitor.    Speech Therapy Frequency 2x / week    Duration 12 weeks    Treatment/Interventions Aspiration precaution training;Pharyngeal strengthening exercises;Functional tasks;SLP instruction and feedback;Compensatory strategies;Internal/external aids;Cognitive reorganization;Patient/family education;Language facilitation    Potential to Achieve Goals Good    SLP Home Exercise Plan loud /a/ and reading out loud    Consulted and Agree with Plan of Care Patient             Patient will benefit from skilled therapeutic intervention in order to improve the following deficits and impairments:   Dysarthria and anarthria    Problem List Patient Active Problem List   Diagnosis Date Noted   Parkinson's disease ([place]) 02/17/2020   Gait abnormality 12/04/2019   Prediabetes 12/03/2018   Vitamin D deficiency 12/03/2018   Anemia due to vitamin B12 deficiency 12/03/2018   Fatigue 12/03/2018   Urinary frequency 10/01/2018   Vitamin B12 deficiency 10/01/2018   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 [place], [place], [address] Phone: [phone]   Fax:  [phone]   Name: Denise  [name] MRN: [id number] Date of Birth: 07/20/1950

## 2021-02-19 ENCOUNTER — Ambulatory Visit: Payer: Medicare Other | Admitting: Physical Therapy

## 2021-02-19 ENCOUNTER — Encounter: Payer: Self-pay | Admitting: Physical Therapy

## 2021-02-19 ENCOUNTER — Ambulatory Visit: Payer: Medicare Other | Admitting: Occupational Therapy

## 2021-02-19 ENCOUNTER — Other Ambulatory Visit: Payer: Self-pay

## 2021-02-19 DIAGNOSIS — M25612 Stiffness of left shoulder, not elsewhere classified: Secondary | ICD-10-CM | POA: Diagnosis not present

## 2021-02-19 DIAGNOSIS — R2681 Unsteadiness on feet: Secondary | ICD-10-CM

## 2021-02-19 DIAGNOSIS — R29818 Other symptoms and signs involving the nervous system: Secondary | ICD-10-CM

## 2021-02-19 DIAGNOSIS — R29898 Other symptoms and signs involving the musculoskeletal system: Secondary | ICD-10-CM | POA: Diagnosis not present

## 2021-02-19 DIAGNOSIS — R293 Abnormal posture: Secondary | ICD-10-CM

## 2021-02-19 DIAGNOSIS — R2689 Other abnormalities of gait and mobility: Secondary | ICD-10-CM | POA: Diagnosis not present

## 2021-02-19 DIAGNOSIS — M6281 Muscle weakness (generalized): Secondary | ICD-10-CM

## 2021-02-19 DIAGNOSIS — R278 Other lack of coordination: Secondary | ICD-10-CM

## 2021-02-19 DIAGNOSIS — R471 Dysarthria and anarthria: Secondary | ICD-10-CM | POA: Diagnosis not present

## 2021-02-19 DIAGNOSIS — R269 Unspecified abnormalities of gait and mobility: Secondary | ICD-10-CM | POA: Diagnosis not present

## 2021-02-19 NOTE — Therapy (Signed)
[facility] [address] [place], [place], [address] Phone: [phone]   Fax:  [phone]  Occupational Therapy Treatment  Patient Details  Name: Denise [name] MRN: [id number] Date of Birth: 1950-07-15 Referring Provider (OT): Dr. 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   Subjective Assessment - 02/19/21 1022     Subjective  Pt report her pain has gone away    Pertinent History Parkinson's Disease (diagnosed 2021).  PMH:  osteopenia, glaucoma    Limitations Pt drives from an hour away    Patient Stated Goals improve posture/"drifting to the left"    Currently in Pain? No/denies                     Treatment: Pt was shown how to incorporate supine PWR! Moves into bed mobility. Dynamic functional reaching with trunk rotation to place graded clothespins on vertical antennae, with left then right UE's min v.c for extending elbows and digits, big movement Seated edge  of mat with mirror in front, therapist had pt adjust to midline for upright posture. Pt then performed bag exercises: crumpling bag for pulling up socks and simulated donning shirt, min v.c              OT Education -  02/19/21 1026     Education Details Reviewed PWR! moves (basic 4) in supine, min v.c for amplitude and trunk rotation, 10 reps each, mod v.c for PWR! step    Person(s) Educated Patient    Methods Explanation;Demonstration;Handout;Verbal cues;Tactile cues    Comprehension Verbalized understanding;Returned demonstration;Verbal cues required              OT Short Term Goals - 02/19/21 1034       OT SHORT TERM GOAL #1   Title Pt will be independent with updated PD-specific HEP.--check STGs 03/10/2021    Time 4    Period Weeks    Status New      OT SHORT TERM GOAL #2   Title Pt will improve functional reaching/balance as shown by improving standing functional reach to at least 12" with LUE.    Baseline 10"    Time 4    Period Weeks    Status New      OT SHORT TERM GOAL #3   Title Pt will demo at least 140* shoulder flex for LUE overhead reaching.    Baseline 130* with -15* elbow ext    Time  4    Period Weeks    Status New      OT SHORT TERM GOAL #4   Title Pt will verbalize understanding of ways to decr risk of future complications related to PD.    Time 4    Period Weeks    Status New               OT Long Term Goals - 02/08/21 1533       OT LONG TERM GOAL #1   Title Pt will verbalize understanding of updated adaptive strategies/AE to incr ease/safety with ADLs/IADLs.--check LTGs 04/09/21    Time 8    Period Weeks    Status New      OT LONG TERM GOAL #2   Title Pt will improve LUE functional reaching/coordination as shown by improving box and blocks score by at least 3.    Baseline 48 blocks    Time 8    Period Weeks    Status New      OT LONG TERM GOAL #3   Title Pt will demo at least 140* LUE overhead reaching with at least -5* elbow ext.    Baseline 130* with -15* elbow ext    Time 8    Period Weeks    Status New      OT LONG TERM GOAL #4   Title Pt will verbalize understanding of updated/appropriate community resources.    Time 8    Period Weeks     Status New      OT LONG TERM GOAL #5   Title Pt will perform functional activity for at least 57mn while maintaining upright posture/avoiding leaning to L.    Time 8    Period Weeks    Status New                   Plan - 02/19/21 1037     Clinical Impression Statement Pt is progressing towards goals. She benefits from v.c for amplitude duing PWR! moves and functional activity.    OT Occupational Profile and History Detailed Assessment- Review of Records and additional review of physical, cognitive, psychosocial history related to current functional performance    Occupational performance deficits (Please refer to evaluation for details): ADL's;IADL's;Leisure;Social Participation    Body Structure / Function / Physical Skills ADL;Balance;Mobility;Strength;Flexibility;UE functional use;FMC;Gait;Coordination;Decreased knowledge of precautions;GMC;ROM;Decreased knowledge of use of DME;Dexterity;IADL;Improper spinal/pelvic alignment    Rehab Potential Good    Clinical Decision Making Several treatment options, min-mod task modification necessary    Comorbidities Affecting Occupational Performance: May have comorbidities impacting occupational performance    Modification or Assistance to Complete Evaluation  Min-Moderate modification of tasks or assist with assess necessary to complete eval    OT Frequency 2x / week    OT Duration 8 weeks   +eval   OT Treatment/Interventions Self-care/ADL training;Therapeutic exercise;Balance training;Functional Mobility Training;Manual Therapy;Neuromuscular education;Therapeutic activities;DME and/or AE instruction;Patient/family education;Passive range of motion;Moist Heat;Cognitive remediation/compensation;Energy conservation;Aquatic Therapy;Splinting;Ultrasound    Plan focus on posture (use mirror), ADL strategies    Consulted and Agree with Plan of Care Patient             Patient will benefit from skilled therapeutic intervention in order  to improve the following deficits and impairments:   Body Structure / Function / Physical Skills: ADL, Balance, Mobility, Strength, Flexibility, UE functional use, FMC, Gait, Coordination, Decreased knowledge of precautions, GMC, ROM, Decreased knowledge of use of DME, Dexterity, IADL, Improper spinal/pelvic alignment  Visit Diagnosis: Other symptoms and signs involving the nervous system  Abnormal posture  Other symptoms and signs involving the musculoskeletal system  Other lack of coordination  Stiffness of left shoulder, not elsewhere classified  Unsteadiness on feet  Muscle weakness (generalized)    Problem List Patient Active Problem List   Diagnosis Date Noted   Parkinson's disease ([place]) 02/17/2020   Gait abnormality 12/04/2019   Prediabetes 12/03/2018   Vitamin D deficiency 12/03/2018   Anemia due to vitamin B12 deficiency 12/03/2018   Fatigue 12/03/2018   Urinary frequency 10/01/2018   Vitamin B12 deficiency 10/01/2018   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 [place], [place], [address] Phone: [phone]   Fax:  [phone]  Name: Denise [name] MRN: [id number] Date of Birth: 1951/01/28

## 2021-02-19 NOTE — Therapy (Addendum)
[facility] [address] [place], [place], [address] Phone: [phone]   Fax:  [phone]  Physical Therapy Treatment  Patient Details  Name: Denise [name] MRN: [id number] Date of Birth: September 04, 1950 Referring Provider (PT): Melvenia Beam, MD   Encounter Date: 02/19/2021   PT End of Session - 02/19/21 0940     Visit Number 3    Number of Visits 13    Date for PT Re-Evaluation 03/29/21    Authorization Type BCBS Medicare    PT Start Time [phone]   pt late and needing to use restroom at start of session   PT Stop Time 1016    PT Time Calculation (min) 37 min    Activity Tolerance Patient tolerated treatment well    Behavior During Therapy [facility] for tasks assessed/performed             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   Subjective Assessment - 02/19/21 0940     Subjective Has had a busy past couple of weeks. Has tried the seated PWR moves a few times.    Pertinent History PD, glaucoma, osteopenia, TB (as a child)    Limitations Walking    Patient Stated Goals wants to know where she is at compared to last time she was here, be more accountable with exercises.    Currently in Pain? Yes    Pain Score 3     Pain Location --   thigh   Pain Orientation Right    Pain Descriptors / Indicators Aching;Dull    Pain Type Acute pain    Aggravating Factors  nothing    Pain Relieving Factors rubbing it with her hand                               [facility] Adult PT Treatment/Exercise - 02/19/21 1014       Ambulation/Gait   Ambulation/Gait Yes    Ambulation/Gait Assistance 5: Supervision    Ambulation/Gait Assistance  Details at start of session - cues for posture, arm swing and step length/foot clearance and between activities    Ambulation Distance (Feet) 345 Feet    Assistive device None    Gait Pattern Step-through pattern;Decreased arm swing - left;Decreased step length - right;Decreased step length - left;Right foot flat;Left foot flat;Decreased trunk rotation;Trunk flexed;Narrow base of support;Poor foot clearance - left;Poor foot clearance - right    Ambulation Surface Level;Indoor              Pt performs PWR! Moves in sitting position    PWR! Up for improved posture 2 x 10 reps B, cues for first couple of reps to hold for a sustained stretch and postural awareness. 2nd set performed with mirror in front of pt as visual cue for midline and posture (pt with tendency to lean trunk/head to L) cues for elbow extension   PWR! Rock for improved weighshifting x10 reps B reaching and with leg kick, cues to look at hands   PWR! Twist for improved trunk rotation x5  reps B, cues for using B feet to pivot and to stop and reset with tall posture in midline.    PWR! Step for improved step initiation x5 reps B step out and then step in, then x5 reps B step out and out and then in and in   Cues provided for technique and level of intensity of exercises. Pt reporting working at an 8/10 level of intensity throughout     Pt performs PWR! Moves in standing position - performed at [employer]! Up for improved posture x10 reps, initial tactile cues for proper technique and wider BOS   PWR! Rock for improved weighshifting x10 reps B    PWR! Twist for improved trunk rotation 2 x 10 reps B, initial cues to turn to look at hands, to pivot on feet and for tall posture in middle   PWR! Step for improved step initiation 2 x 10 reps B cues to reach and for incr foot clearance when stepping.   Cues provided for technique and intensity. Added standing PWR moves back in to HEP as pt performing well today.         PT Education - 02/19/21 1016     Education Details reviewed standing PWR moves for HEP (from previous bout of therapy)    Person(s) Educated Patient    Methods Explanation;Demonstration    Comprehension Verbalized understanding;Returned demonstration              PT Short Term Goals - 01/28/21 1937       PT SHORT TERM GOAL #1   Title Pt will be independent with initial HEP for improved strength, balance, transfers, and gait.  ALL STGS DUE 02/25/21    Time 4    Period Weeks    Status New    Target Date 02/25/21      PT SHORT TERM GOAL #2   Title Pt will improve 5x sit<>stand to less than or equal to 13.5 sec to demonstrate improved functional strength and transfer efficiency.    Baseline 14.6 seconds no UE support    Time 4    Period Weeks    Status New      PT SHORT TERM GOAL #3   Title Pt will improve manual TUG score to less than or equal to 13.5 seconds for decreased fall risk and improved dual tasking    Baseline 14.62 seconds    Time 4    Period Weeks    Status New      PT SHORT TERM GOAL #4   Title Pt will recover posterior balance in push and release test in 2 or less steps independently, for improved balance recovery.    Baseline posterior push and release: 3 small steps    Time 4    Period Weeks    Status New      PT SHORT TERM GOAL #5   Title --    Baseline --    Time --    Period --    Status --               PT Long Term Goals - 01/28/21 1939       PT LONG TERM GOAL #1   Title Pt will be independent with final HEP for improved strength, balance, transfers, and gait. ALL LTGS DUE 03/25/21    Time 8    Period Weeks    Status New    Target Date 03/25/21  PT LONG TERM GOAL #2   Title Pt will verbalize understanding of local Parkinson's disease resources, including options for continue community fitness.    Time 8    Period Weeks    Status New      PT LONG TERM GOAL #3   Title Pt will improve TUG cognitive score to less  than or equal to 18 seconds for decreased fall risk.    Baseline 24.81 seconds    Time 8    Period Weeks    Status New      PT LONG TERM GOAL #4   Title Pt will improve MiniBESTest score to at least 22/28 for decreased fall risk.    Baseline 16/28 at eval on 01/28/21    Time 8    Period Weeks    Status New      PT LONG TERM GOAL #5   Title Pt will ambulate at least 1000 ft, indoors/outdoor surfaces independently for improved community gait.    Time 8    Period Weeks    Status New                   Plan - 02/19/21 1214     Clinical Impression Statement Reviewed seated PWR moves given from last session with pt performing well with initial cues for technique and pt rating an intensity of 8/10 throughout, encouraged pt to perform more consistently at home. Reviewed standing PWR moves from prior HEP and added them back in with pt performing with bigger movement patterns. Will continue to progress towards LTGs.    Personal Factors and Comorbidities Comorbidity 3+;Time since onset of injury/illness/exacerbation;Other   Recent addition of Sinemet to medications   Comorbidities glaucoma, osteopenia, TB (as a child)    Examination-Activity Limitations Transfers;Locomotion Level;Stand    Examination-Participation Restrictions Church;Community Activity    Stability/Clinical Decision Making Stable/Uncomplicated    Rehab Potential Good    PT Frequency 2x / week   12 visits over 8 weeks   PT Duration 6 weeks   12 visits over 8 weeks   PT Treatment/Interventions ADLs/Self Care Home Management;Gait training;Functional mobility training;Stair training;Therapeutic activities;Therapeutic exercise;Balance training;Neuromuscular re-education;DME Instruction;Patient/family education;Vestibular    PT Next Visit Plan review standing PWR moves for HEP (given at last session) and review remainder of HEP (balance/posture). gait training with focus on posture,arm swing, step length. stepping strategies  for balance.    Consulted and Agree with Plan of Care Patient             Patient will benefit from skilled therapeutic intervention in order to improve the following deficits and impairments:  Abnormal gait, Difficulty walking, Impaired tone, Decreased balance, Impaired flexibility, Decreased mobility, Decreased strength, Postural dysfunction  Visit Diagnosis: Other symptoms and signs involving the nervous system  Abnormal posture  Unsteadiness on feet     Problem List Patient Active Problem List   Diagnosis Date Noted   Parkinson's disease ([place]) 02/17/2020   Gait abnormality 12/04/2019   Prediabetes 12/03/2018   Vitamin D deficiency 12/03/2018   Anemia due to vitamin B12 deficiency 12/03/2018   Fatigue 12/03/2018   Urinary frequency 10/01/2018   Vitamin B12 deficiency 10/01/2018   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 [address] [place], [place], [address] Phone: [phone]  Fax:  [phone]  Name: Denise [name] MRN: [id number] Date of Birth: [birth year]

## 2021-02-23 ENCOUNTER — Ambulatory Visit: Payer: Medicare Other | Admitting: Physical Therapy

## 2021-02-23 ENCOUNTER — Other Ambulatory Visit: Payer: Self-pay

## 2021-02-23 ENCOUNTER — Ambulatory Visit: Payer: Medicare Other

## 2021-02-23 ENCOUNTER — Ambulatory Visit: Payer: Medicare Other | Admitting: Occupational Therapy

## 2021-02-23 ENCOUNTER — Encounter: Payer: Self-pay | Admitting: Physical Therapy

## 2021-02-23 DIAGNOSIS — R2681 Unsteadiness on feet: Secondary | ICD-10-CM

## 2021-02-23 DIAGNOSIS — R29818 Other symptoms and signs involving the nervous system: Secondary | ICD-10-CM

## 2021-02-23 DIAGNOSIS — R29898 Other symptoms and signs involving the musculoskeletal system: Secondary | ICD-10-CM | POA: Diagnosis not present

## 2021-02-23 DIAGNOSIS — R278 Other lack of coordination: Secondary | ICD-10-CM

## 2021-02-23 DIAGNOSIS — R471 Dysarthria and anarthria: Secondary | ICD-10-CM

## 2021-02-23 DIAGNOSIS — R2689 Other abnormalities of gait and mobility: Secondary | ICD-10-CM | POA: Diagnosis not present

## 2021-02-23 DIAGNOSIS — R293 Abnormal posture: Secondary | ICD-10-CM

## 2021-02-23 DIAGNOSIS — M25612 Stiffness of left shoulder, not elsewhere classified: Secondary | ICD-10-CM | POA: Diagnosis not present

## 2021-02-23 DIAGNOSIS — M6281 Muscle weakness (generalized): Secondary | ICD-10-CM | POA: Diagnosis not present

## 2021-02-23 DIAGNOSIS — R269 Unspecified abnormalities of gait and mobility: Secondary | ICD-10-CM | POA: Diagnosis not present

## 2021-02-23 NOTE — Therapy (Signed)
[facility] [address] [place], [place], [address] Phone: [phone]   Fax:  [phone]  Physical Therapy Treatment  Patient Details  Name: Denise [name] MRN: [id number] Date of Birth: 05/05/1951 Referring Provider (PT): Melvenia Beam, MD   Encounter Date: 02/23/2021   PT End of Session - 02/23/21 1644     Visit Number 4    Number of Visits 13    Date for PT Re-Evaluation 03/29/21    Authorization Type BCBS Medicare    PT Start Time 1405    PT Stop Time 1445    PT Time Calculation (min) 40 min    Activity Tolerance Patient tolerated treatment well    Behavior During Therapy [facility] for tasks assessed/performed             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   Subjective Assessment - 02/23/21 1408     Subjective No falls, no pain today.  Feel like I lean over to the left some, know I need to correct that.    Pertinent History PD, glaucoma, osteopenia, TB (as a child)    Limitations Walking    Patient Stated Goals wants to know where she is at compared to last time she was here, be more accountable with exercises.    Currently in Pain? No/denies                Neuro Re-education  Pt performs PWR! Moves in standing position, 10 reps, 2 sets (1st set performed at counter, 2nd set performed standing in front of mat, with chair in front if needed for support and mirror in front for visual feedback)   PWR! Up for improved posture-cues for wider BOS and for increased knee flexion (gentle squat position)  PWR! Rock for improved weighshifting - cues for rocking to initiate, then added reach (pt starts to step each time and  gets better with rocking with cues and repetition)  PWR! Twist for improved trunk rotation - good performance, good open posture in midline  PWR! Step for improved step initiation -cues for step height and foot clearance  Cues provided-verbal, visual, tactile for technique       Pt rates effort level with standing PWR! Moves as 8/10         [facility] Adult PT Treatment/Exercise - 02/23/21 1405       Ambulation/Gait   Ambulation/Gait Yes    Ambulation/Gait Assistance 5: Supervision    Ambulation Distance (Feet) 450 Feet    Assistive device None    Gait Pattern Step-through pattern;Decreased arm swing - left;Decreased step length - right;Decreased step length - left;Right foot flat;Left foot flat;Decreased trunk rotation;Trunk flexed;Narrow base of support;Poor foot clearance - left;Poor foot clearance - right    Ambulation Surface Level;Indoor    Gait Comments Cues for upright posture and increased step length      Posture/Postural Control   Posture/Postural Control Postural limitations    Posture Comments Weigthshifted to L side; discussed and trialed use of mirror for visual feedback, with pt able to make some corrections intermittently throughout session.  Balance Exercises - 02/23/21 0001       Balance Exercises: Standing   Stepping Strategy Anterior;Posterior;Lateral;UE support;Limitations    Stepping Strategy Limitations Anterior and lateral step and return to midline, stepping over 2" flat black obstacle, alternating legs with initial cues for increased step length.    Other Standing Exercises Posterior step and weightshift with cues for increased intensity for balance recovery.  Then progressed to manual perturbations in posterior direction, then with perturbations with red theraband at hips, with intermittent UE support and initial cues to "hinge" at hips for improved balance recovery.  Progressed to random push and release, x 5 reps in posterior  direction, pt able to recover all 5 reps in one step.               PT Education - 02/23/21 1643     Education Details REviewed standing PWR! Moves, rationale for and practice with posterior step and weightshift    Person(s) Educated Patient    Methods Demonstration;Explanation    Comprehension Verbalized understanding;Returned demonstration              PT Short Term Goals - 02/23/21 1423       PT SHORT TERM GOAL #1   Title Pt will be independent with initial HEP for improved strength, balance, transfers, and gait.  ALL STGS DUE 02/25/21    Baseline Occasional cues for technique    Time 4    Period Weeks    Status Partially Met    Target Date 02/25/21      PT SHORT TERM GOAL #2   Title Pt will improve 5x sit<>stand to less than or equal to 13.5 sec to demonstrate improved functional strength and transfer efficiency.    Baseline 14.6 seconds no UE support    Time 4    Period Weeks    Status New      PT SHORT TERM GOAL #3   Title Pt will improve manual TUG score to less than or equal to 13.5 seconds for decreased fall risk and improved dual tasking    Baseline 14.62 seconds    Time 4    Period Weeks    Status New      PT SHORT TERM GOAL #4   Title Pt will recover posterior balance in push and release test in 2 or less steps independently, for improved balance recovery.    Baseline posterior push and release: 3 small steps    Time 4    Period Weeks    Status New               PT Long Term Goals - 01/28/21 1939       PT LONG TERM GOAL #1   Title Pt will be independent with final HEP for improved strength, balance, transfers, and gait. ALL LTGS DUE 03/25/21    Time 8    Period Weeks    Status New    Target Date 03/25/21      PT LONG TERM GOAL #2   Title Pt will verbalize understanding of local Parkinson's disease resources, including options for continue community fitness.    Time 8    Period Weeks    Status New      PT LONG TERM GOAL #3   Title  Pt will improve TUG cognitive score to less than or equal to 18 seconds for decreased fall risk.    Baseline 24.81 seconds    Time 8    Period Weeks  Status New      PT LONG TERM GOAL #4   Title Pt will improve MiniBESTest score to at least 22/28 for decreased fall risk.    Baseline 16/28 at eval on 01/28/21    Time 8    Period Weeks    Status New      PT LONG TERM GOAL #5   Title Pt will ambulate at least 1000 ft, indoors/outdoor surfaces independently for improved community gait.    Time 8    Period Weeks    Status New                   Plan - 02/23/21 1644     Clinical Impression Statement Continued to review standing PWR! Moves, performed at counter, then with chair in front for support to use mirror for visual feedback on posture.  Pt does not rely on UE support at counter or chair, it is just there for support intermittently.  She requires cues for technique for standing PWR! Rock and for [employer]! Step, to reinforce correct technique.  She does rate effort level of 8/10 throughout, with discussion on intensity and amplitude of movement patterns.  She will continue to benefit from skilled PT to address balance, posture, gait towards goals of improved overall functional mobility.    Personal Factors and Comorbidities Comorbidity 3+;Time since onset of injury/illness/exacerbation;Other   Recent addition of Sinemet to medications   Comorbidities glaucoma, osteopenia, TB (as a child)    Examination-Activity Limitations Transfers;Locomotion Level;Stand    Examination-Participation Restrictions Church;Community Activity    Stability/Clinical Decision Making Stable/Uncomplicated    Rehab Potential Good    PT Frequency 2x / week   12 visits over 8 weeks   PT Duration 6 weeks   12 visits over 8 weeks   PT Treatment/Interventions ADLs/Self Care Home Management;Gait training;Functional mobility training;Stair training;Therapeutic activities;Therapeutic exercise;Balance  training;Neuromuscular re-education;DME Instruction;Patient/family education;Vestibular    PT Next Visit Plan review standing PWR moves for HEP as needed.  Review remainder of HEP (balance/posture).  Check STGs. gait training with focus on posture,arm swing, step length. stepping strategies for balance.  (Will likely need to recert at some point, due to pt's delay in scheduling, with all 3 disciplines).  Continue to work on posterior balance recovery.    Consulted and Agree with Plan of Care Patient             Patient will benefit from skilled therapeutic intervention in order to improve the following deficits and impairments:  Abnormal gait, Difficulty walking, Impaired tone, Decreased balance, Impaired flexibility, Decreased mobility, Decreased strength, Postural dysfunction  Visit Diagnosis: Unsteadiness on feet  Abnormal posture  Other symptoms and signs involving the nervous system  Other abnormalities of gait and mobility     Problem List Patient Active Problem List   Diagnosis Date Noted   Parkinson's disease ([place]) 02/17/2020   Gait abnormality 12/04/2019   Prediabetes 12/03/2018   Vitamin D deficiency 12/03/2018   Anemia due to vitamin B12 deficiency 12/03/2018   Fatigue 12/03/2018   Urinary frequency 10/01/2018   Vitamin B12 deficiency 10/01/2018   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 02/23/2021, 4:54 PM Frazier Butt., PT   [facility] [address] [place], [place], [address] Phone: [phone]   Fax:  [phone]  Name: Denise [name] MRN: [id number] Date of Birth:  1950-10-13

## 2021-02-23 NOTE — Patient Instructions (Signed)
  EACH DAY, TWICE A DAY: -5 loud "ah" -read your "everyday" sentences  Until otherwise told: Twice a day, practice your abdominal breathing for 10-15 mintues

## 2021-02-23 NOTE — Therapy (Signed)
[facility] [address] [place], [place], [address] Phone: [phone]   Fax:  [phone]  Occupational Therapy Treatment  Patient Details  Name: Denise [name] MRN: [id number] Date of Birth: 1951/05/23 Referring Provider (OT): Dr. 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   Subjective Assessment - 02/23/21 1322     Subjective  Pt reports mild pain in thigh    Pertinent History Parkinson's Disease (diagnosed 2021).  PMH:  osteopenia, glaucoma    Patient Stated Goals improve posture/"drifting to the left"    Currently in Pain? Yes    Pain Location Leg    Pain Orientation Right    Pain Descriptors / Indicators Aching    Pain Type Acute pain    Pain Onset 1 to 4 weeks ago    Pain Frequency Intermittent    Aggravating Factors  unknown    Pain Relieving Factors pressure on it                 Treatment: Modified quadraped basic 4 PWR! Moves 10 reps each, min v.c for amplitude and demonstration, increased time required. Standing to perfrom functional step and reach to place small pegs in pegboard with right and left UE's, min v.c for amplitude and reach. Pt completed design in seated with emphasis on upright midline posture, min v.c   Sitting in front of mirror min v.c for upright posture and locating midline.                   OT Short Term Goals - 02/23/21 1336       OT SHORT TERM GOAL #1   Title Pt will be independent with updated PD-specific HEP.--check STGs 03/10/2021    Time 4    Period Weeks    Status On-going      OT SHORT TERM GOAL #2   Title Pt will improve functional reaching/balance as shown by improving standing functional reach to at least 12" with LUE.    Baseline 10"    Time 4    Period Weeks    Status On-going      OT SHORT TERM GOAL #3   Title Pt will demo at least 140* shoulder flex for LUE overhead reaching.    Baseline 130* with -15* elbow ext    Time 4    Period Weeks    Status On-going      OT SHORT TERM GOAL #4   Title Pt will verbalize understanding  of ways to decr risk of future complications related to PD.    Time 4    Period Weeks    Status On-going               OT Long Term Goals - 02/23/21 1336       OT LONG TERM GOAL #1   Title Pt will verbalize understanding of updated adaptive strategies/AE to incr ease/safety with ADLs/IADLs.--check LTGs 04/09/21    Time 8    Period Weeks    Status On-going      OT LONG TERM GOAL #2   Title Pt will improve LUE functional reaching/coordination as shown by improving box and blocks score by at least 3.    Baseline 48 blocks    Time 8    Period Weeks    Status On-going      OT LONG TERM GOAL #3   Title Pt will demo at least 140* LUE overhead reaching with at least -5* elbow ext.    Baseline 130* with -15* elbow ext    Time 8    Period Weeks    Status On-going      OT LONG TERM GOAL #4   Title Pt will verbalize understanding of updated/appropriate community resources.    Time 8    Period Weeks    Status On-going      OT LONG TERM GOAL #5   Title Pt will perform functional activity for at least 37mn while maintaining upright posture/avoiding leaning to L.    Time 8    Period Weeks    Status On-going       OT LONG TERM GOAL #6   Title Pt will demonstrate ability to retrieve a lightweight object from overhead shelf with  -10 elbow extension with RUE    Status On-going      OT LONG TERM GOAL #7   Title Pt will increase bilateral UE standing functional reach to 10 inches or greater to minimize fall risk and to increase I with ADLs.    Status On-going                   Plan - 02/23/21 1323     Clinical Impression Statement Pt is progressing towards goals. She requires v.c for upright midline posture and big movements during functional activity    OT Occupational Profile and History Detailed Assessment- Review of Records and additional review of physical, cognitive, psychosocial history related to current functional performance    Occupational performance deficits (Please refer to evaluation for details): ADL's;IADL's;Leisure;Social Participation    Body Structure / Function / Physical Skills ADL;Balance;Mobility;Strength;Flexibility;UE functional use;FMC;Gait;Coordination;Decreased knowledge of precautions;GMC;ROM;Decreased knowledge of use of DME;Dexterity;IADL;Improper spinal/pelvic alignment    Rehab Potential Good    Clinical Decision Making Several treatment options, min-mod task modification necessary    Comorbidities Affecting Occupational Performance: May have comorbidities impacting occupational performance    Modification or Assistance to Complete Evaluation  Min-Moderate modification of tasks or assist with assess necessary to complete eval    OT Frequency 2x / week    OT Duration 8 weeks   +eval   OT Treatment/Interventions Self-care/ADL training;Therapeutic exercise;Balance training;Functional Mobility Training;Manual Therapy;Neuromuscular education;Therapeutic activities;DME and/or AE instruction;Patient/family education;Passive range of motion;Moist Heat;Cognitive remediation/compensation;Energy conservation;Aquatic Therapy;Splinting;Ultrasound    Plan reveiw and issue  modified quadraped PWR! focus on posture (use mirror), ADL strategies    Consulted and Agree with Plan of Care Patient  Patient will benefit from skilled therapeutic intervention in order to improve the following deficits and impairments:   Body Structure / Function / Physical Skills: ADL, Balance, Mobility, Strength, Flexibility, UE functional use, FMC, Gait, Coordination, Decreased knowledge of precautions, GMC, ROM, Decreased knowledge of use of DME, Dexterity, IADL, Improper spinal/pelvic alignment       Visit Diagnosis: Other symptoms and signs involving the nervous system  Abnormal posture  Other symptoms and signs involving the musculoskeletal system  Other lack of coordination  Stiffness of left shoulder, not elsewhere classified  Unsteadiness on feet    Problem List Patient Active Problem List   Diagnosis Date Noted   Parkinson's disease ([place]) 02/17/2020   Gait abnormality 12/04/2019   Prediabetes 12/03/2018   Vitamin D deficiency 12/03/2018   Anemia due to vitamin B12 deficiency 12/03/2018   Fatigue 12/03/2018   Urinary frequency 10/01/2018   Vitamin B12 deficiency 10/01/2018   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 [place], [place], [address] Phone: [phone]   Fax:  [phone]  Name: Kischa Erle MRN: [id number] Date of Birth: 1951/03/18

## 2021-02-23 NOTE — Therapy (Signed)
[facility] [address] [place], [place], [address] Phone: [phone]   Fax:  [phone]  Speech Language Pathology Treatment  Patient Details  Name: Denise [name] MRN: [id number] Date of Birth: 08-26-1950 Referring Provider (SLP): Sarina Ill, MD   Encounter Date: 02/23/2021   End of Session - 02/23/21 1536     Visit Number 5    Number of Visits 25    Date for SLP Re-Evaluation 04/28/21    SLP Start Time 1448    SLP Stop Time  1529    SLP Time Calculation (min) 41 min    Activity Tolerance Patient tolerated treatment well             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   Subjective Assessment - 02/23/21 1452     Subjective Pt's daughter Denise [name] and granddaughter arrived with pt today.    Currently in Pain? No/denies                   ADULT SLP TREATMENT - 02/23/21 1503       General Information   Behavior/Cognition Alert;Cooperative;Pleasant mood      Treatment Provided   Treatment provided Cognitive-Linquistic      Cognitive-Linquistic Treatment   Treatment focused on Voice;Dysarthria    Skilled Treatment Loud /a/ was utilized by SLP today to recalibrate Jaylah's volume with average of low-mid 80s dB. SLP then educated pt/dtr about abdominal breathing (AB). SLP attempted to mix AB with loud /a/ but this was too much for pt to master both things well, so SLP told Denise [name] to focus on loudness for loud /a/ and hand movement for AB. Denise [name] read her 76 personally relevant sentences with average low 70s, so SLP shaped reading of sentences to mid-upper 70s and told pt to cont to recite them this loudly at home  BID. SLP reiterated BID loud /a/ x5, BID AB practice of 10-15 minutes, and BID recitation of everyday sentences.      Assessment / Recommendations / Plan   Plan Continue with current plan of care      Progression Toward Goals   Progression toward goals Progressing toward goals              SLP Education - 02/23/21 1535     Education Details abdominal breathing, BID /a/, everyday sentences and practice for AB    Person(s) Educated Patient;Child(ren);Caregiver(s)    Methods Explanation;Demonstration;Verbal cues    Comprehension Verbalized understanding;Returned demonstration;Verbal cues required;Need further instruction              SLP Short Term Goals - 02/23/21 1538       SLP SHORT TERM GOAL #1   Title pt will produce loud /a/ with low 80s dB average in 3 sessions    Baseline 02/08/21, 02-16-21    Time 3    Period Weeks    Status On-going    Target Date 02/26/21      SLP SHORT TERM GOAL #2   Title pt will produce sentence responses 80% WNL loudness in 2 sessions    Time  3    Period Weeks    Status On-going    Target Date 02/26/21      SLP SHORT TERM GOAL #3   Title pt will use abdominal breathing (AB) in sentence responses 80% of the time in 3 sessions    Time 3    Period Weeks    Status On-going    Target Date 02/26/21      SLP SHORT TERM GOAL #4   Title pt will produce at least 90-second monologue with loudness average low 70s dB in two sessions    Time 3    Period Weeks    Status On-going    Target Date 02/26/21              SLP Long Term Goals - 02/23/21 1538       SLP LONG TERM GOAL #1   Title pt will use abdominal breathing (AB) in 5 minutes simple conversation 80% of the time in 3 sessions    Time 7    Period Weeks    Status On-going    Target Date 03/26/21      SLP LONG TERM GOAL #2   Title pt will use WNL loudness in 5 minutes simple conversation over three sessions    Time 7    Period Weeks    Status On-going    Target Date  03/26/21      SLP LONG TERM GOAL #3   Title pt will use abdominal breathing (AB) in 10 minutes simple conversation 80% of the time in 3 sessions    Time 11    Period Weeks    Status On-going    Target Date 04/23/21      SLP LONG TERM GOAL #4   Title pt will use WNL loudness in 10 minutes simple conversation over three sessions    Time 11    Period Weeks    Status On-going    Target Date 04/23/21              Plan - 02/23/21 1536     Clinical Impression Statement Denise [name] continues to present today with suboptimal volume levels in conversation. Dtr Denise [name] attended with pt today and took notes. SLP ?s pt's completion of homework between sessions. She would cont to benefit from skilled ST focusing on improving her speech volume to WNL, and then maintaining consistency of her WNL-volume speech. She may or may not require objective swallow assessment during this therapy course. SLP to cont to monitor.    Speech Therapy Frequency 2x / week    Duration 12 weeks    Treatment/Interventions Aspiration precaution training;Pharyngeal strengthening exercises;Functional tasks;SLP instruction and feedback;Compensatory strategies;Internal/external aids;Cognitive reorganization;Patient/family education;Language facilitation    Potential to Achieve Goals Good    SLP Home Exercise Plan loud /a/ and reading out loud    Consulted and Agree with Plan of Care Patient             Patient will benefit from skilled therapeutic intervention in order to improve the following deficits and impairments:   Dysarthria and anarthria    Problem List Patient Active Problem List   Diagnosis Date Noted   Parkinson's disease ([place]) 02/17/2020   Gait abnormality 12/04/2019   Prediabetes 12/03/2018   Vitamin D deficiency 12/03/2018   Anemia due to vitamin B12 deficiency 12/03/2018   Fatigue 12/03/2018   Urinary frequency 10/01/2018   Vitamin B12 deficiency 10/01/2018   Transient arterial occlusion  05/10/2016   Glaucomatous optic atrophy  05/10/2016   Abnormal auditory perception of both ears 05/10/2016   Osteopenia 08/23/2012    [facility] ,[place], CCC-SLP  02/23/2021, 3:39 PM  [facility] [address] [place], [place], [address] Phone: [phone]   Fax:  [phone]   Name: Denise [name] MRN: [id number] Date of Birth: 1951/07/03

## 2021-02-25 ENCOUNTER — Ambulatory Visit: Payer: Medicare Other | Admitting: Occupational Therapy

## 2021-02-25 ENCOUNTER — Other Ambulatory Visit: Payer: Self-pay

## 2021-02-25 ENCOUNTER — Ambulatory Visit: Payer: Medicare Other | Admitting: Physical Therapy

## 2021-02-25 ENCOUNTER — Encounter: Payer: Self-pay | Admitting: Physical Therapy

## 2021-02-25 ENCOUNTER — Encounter: Payer: Self-pay | Admitting: Nurse Practitioner

## 2021-02-25 ENCOUNTER — Ambulatory Visit: Payer: Medicare Other

## 2021-02-25 DIAGNOSIS — R2689 Other abnormalities of gait and mobility: Secondary | ICD-10-CM

## 2021-02-25 DIAGNOSIS — R278 Other lack of coordination: Secondary | ICD-10-CM

## 2021-02-25 DIAGNOSIS — R471 Dysarthria and anarthria: Secondary | ICD-10-CM | POA: Diagnosis not present

## 2021-02-25 DIAGNOSIS — R269 Unspecified abnormalities of gait and mobility: Secondary | ICD-10-CM | POA: Diagnosis not present

## 2021-02-25 DIAGNOSIS — R2681 Unsteadiness on feet: Secondary | ICD-10-CM | POA: Diagnosis not present

## 2021-02-25 DIAGNOSIS — M6281 Muscle weakness (generalized): Secondary | ICD-10-CM | POA: Diagnosis not present

## 2021-02-25 DIAGNOSIS — R29818 Other symptoms and signs involving the nervous system: Secondary | ICD-10-CM

## 2021-02-25 DIAGNOSIS — R29898 Other symptoms and signs involving the musculoskeletal system: Secondary | ICD-10-CM

## 2021-02-25 DIAGNOSIS — R293 Abnormal posture: Secondary | ICD-10-CM | POA: Diagnosis not present

## 2021-02-25 DIAGNOSIS — M25612 Stiffness of left shoulder, not elsewhere classified: Secondary | ICD-10-CM | POA: Diagnosis not present

## 2021-02-25 NOTE — Therapy (Signed)
[facility] [address] [place], [place], [address] Phone: [phone]   Fax:  [phone]  Physical Therapy Treatment  Patient Details  Name: Denise [name] MRN: [id number] Date of Birth: [birth year] Referring Provider (PT): Melvenia Beam, MD   Encounter Date: 02/25/2021   PT End of Session - 02/25/21 1018     Visit Number 5    Number of Visits 13    Date for PT Re-Evaluation 03/29/21    Authorization Type BCBS Medicare    Progress Note Due on Visit 10    PT Start Time 1018    PT Stop Time 1059    PT Time Calculation (min) 41 min    Activity Tolerance Patient tolerated treatment well    Behavior During Therapy [facility] for tasks assessed/performed             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   Subjective Assessment - 02/25/21 1019     Subjective Nothing new since last visit.    Pertinent History PD, glaucoma, osteopenia, TB (as a child)    Limitations Walking    Patient Stated Goals wants to know where she is at compared to last time she was here, be more accountable with exercises.                Pt performs PWR! Moves in standing position, 10 reps, 2 sets (performed standing in front of mat, with chair in front if needed for support and mirror in front for visual feedback)   PWR! Up for improved posture   PWR! Rock for improved weighshifting   PWR! Twist for improved trunk rotation - cues for brief stop in midline  PWR! Step for improved step initiation -cues for step height and foot clearance   Minimal Cues provided-verbal, visual, tactile for technique                    [facility] Adult PT  Treatment/Exercise - 02/25/21 0001       Transfers   Transfers Sit to Stand;Stand to Sit    Sit to Stand 6: Modified independent (Device/Increase time);Without upper extremity assist;From bed;From chair/3-in-1    Five time sit to stand comments  12.6 sec from chair, no UE support    Stand to Sit 6: Modified independent (Device/Increase time);Without upper extremity assist;To bed;To chair/3-in-1      Ambulation/Gait   Ambulation/Gait Yes    Ambulation/Gait Assistance 5: Supervision    Ambulation/Gait Assistance Details At start of sesison for warm-up, cues for increased step length and foot clearance    Ambulation Distance (Feet) 400 Feet    Assistive device None    Gait Pattern Step-through pattern;Decreased arm swing - left;Decreased step length - right;Decreased step length - left;Right foot flat;Left foot flat;Decreased trunk rotation;Trunk flexed;Narrow base of support;Poor foot clearance - left;Poor foot clearance - right    Ambulation Surface Level;Indoor    Gait Comments Additional gait, 100 ft, then 40 ft x 2 with cues for initial step length and foot clearance      Timed Up and Go Test  TUG Normal TUG;Manual TUG    Normal TUG (seconds) 12    Manual TUG (seconds) 12.09                 Balance Exercises - 02/25/21 0001       Balance Exercises: Standing   Standing Eyes Opened Wide (BOA);Foam/compliant surface;Head turns;Limitations;Narrow base of support (BOS)    Standing Eyes Opened Limitations Head turns/nods x 5 reps each 1 UE support at chair.  Review of previous HEP; pt return demo understanding.    Standing Eyes Closed Foam/compliant surface;Wide (BOA);Narrow base of support (BOS);Limitations    Standing Eyes Closed Limitations Head turns/nods x 5 reps; review of HEP from previous bout of therapy and pt return demo understanding.    Other Standing Exercises Posterior step and weightshift with cues for increased intensity for balance recovery.  Then progressed to  manual perturbations in posterior direction, 5-10 reps  Performed push and release, x 3 reps in posterior direction, pt able to recover  in 1-2 steps.               PT Education - 02/25/21 1104     Education Details Reviewed pt's HEP from previous bout of therapy:  PWR! Moves standing, posterior step and weigthshift, corner balance exercises (wide/narrow stance with EO/EC)    Person(s) Educated Patient    Methods Explanation;Demonstration   Pt has handouts from previous bout of therapy   Comprehension Verbalized understanding;Returned demonstration;Verbal cues required   min verbal cues             PT Short Term Goals - 02/25/21 1027       PT SHORT TERM GOAL #1   Title Pt will be independent with initial HEP for improved strength, balance, transfers, and gait.  ALL STGS DUE 02/25/21    Baseline Occasional cues for technique    Time 4    Period Weeks    Status Partially Met    Target Date 02/25/21      PT SHORT TERM GOAL #2   Title Pt will improve 5x sit<>stand to less than or equal to 13.5 sec to demonstrate improved functional strength and transfer efficiency.    Baseline 14.6 seconds no UE support; 02/25/21:  13.78 sec 1st trial, 12.6 sec 2nd trial    Time 4    Period Weeks    Status Achieved      PT SHORT TERM GOAL #3   Title Pt will improve manual TUG score to less than or equal to 13.5 seconds for decreased fall risk and improved dual tasking    Baseline 14.62 seconds; 02/25/21 12.09 sec    Time 4    Period Weeks    Status Achieved      PT SHORT TERM GOAL #4   Title Pt will recover posterior balance in push and release test in 2 or less steps independently, for improved balance recovery.    Baseline posterior push and release: 3 small steps at eval; 1-2 steps in 3 trials, 02/25/21    Time 4    Period Weeks    Status Achieved               PT Long Term Goals - 01/28/21 1939       PT LONG TERM GOAL #1   Title Pt will be independent with final HEP for  improved strength, balance, transfers, and gait. ALL LTGS DUE 03/25/21    Time 8    Period Weeks  Status New    Target Date 03/25/21      PT LONG TERM GOAL #2   Title Pt will verbalize understanding of local Parkinson's disease resources, including options for continue community fitness.    Time 8    Period Weeks    Status New      PT LONG TERM GOAL #3   Title Pt will improve TUG cognitive score to less than or equal to 18 seconds for decreased fall risk.    Baseline 24.81 seconds    Time 8    Period Weeks    Status New      PT LONG TERM GOAL #4   Title Pt will improve MiniBESTest score to at least 22/28 for decreased fall risk.    Baseline 16/28 at eval on 01/28/21    Time 8    Period Weeks    Status New      PT LONG TERM GOAL #5   Title Pt will ambulate at least 1000 ft, indoors/outdoor surfaces independently for improved community gait.    Time 8    Period Weeks    Status New                   Plan - 02/25/21 1109     Clinical Impression Statement Assessed STGs this visit with pt meeting 3 of 4 STGs.  Pt has partially met STG 1 for HEP, as pt needs occasional cues for technique for HEP.  STG 2 met for improved 5x sit<>stand; STG 3 met for improved TUG cog score; STG 4 met for posterior balance recovery.  Pt has only been seen for 4 visits since eval, due to scheduling, and despite this, pt is making good progress towards functional mobility.  Pt performs standing PWR! Moves with less cues today and improved technique compared to last visit, and pt is performing them at home.  She will continue to benefit from skilled PT to address large amplitude movement patterns for improved posture, gait and balance.    Personal Factors and Comorbidities Comorbidity 3+;Time since onset of injury/illness/exacerbation;Other   Recent addition of Sinemet to medications   Comorbidities glaucoma, osteopenia, TB (as a child)    Examination-Activity Limitations Transfers;Locomotion  Level;Stand    Examination-Participation Restrictions Church;Community Activity    Stability/Clinical Decision Making Stable/Uncomplicated    Rehab Potential Good    PT Frequency 2x / week   12 visits over 8 weeks   PT Duration 6 weeks   12 visits over 8 weeks   PT Treatment/Interventions ADLs/Self Care Home Management;Gait training;Functional mobility training;Stair training;Therapeutic activities;Therapeutic exercise;Balance training;Neuromuscular re-education;DME Instruction;Patient/family education;Vestibular    PT Next Visit Plan review standing PWR moves for HEP as needed. Gait training with focus on posture,arm swing, step length. stepping strategies for balance.  Continue to work on posterior balance recovery.  *8/30:  PLEASE check 6MWT and go ahead to do recert to cover remaining scheduled visits (due to delay in scheduling initially)    Consulted and Agree with Plan of Care Patient             Patient will benefit from skilled therapeutic intervention in order to improve the following deficits and impairments:  Abnormal gait, Difficulty walking, Impaired tone, Decreased balance, Impaired flexibility, Decreased mobility, Decreased strength, Postural dysfunction  Visit Diagnosis: Unsteadiness on feet  Abnormal posture  Other symptoms and signs involving the nervous system  Other abnormalities of gait and mobility     Problem List Patient Active Problem List  Diagnosis Date Noted   Parkinson's disease ([place]) 02/17/2020   Gait abnormality 12/04/2019   Prediabetes 12/03/2018   Vitamin D deficiency 12/03/2018   Anemia due to vitamin B12 deficiency 12/03/2018   Fatigue 12/03/2018   Urinary frequency 10/01/2018   Vitamin B12 deficiency 10/01/2018   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 02/25/2021, 11:18 AM Frazier Butt.,  PT   [facility] [address] [place], [place], [address] Phone: [phone]   Fax:  [phone]  Name: Denise [name] MRN: [id number] Date of Birth: 06-18-1951

## 2021-02-25 NOTE — Patient Instructions (Signed)
  Coordination Exercises  Perform the following exercises for 20 minutes 1 times per day. Perform with both hand(s). Perform using big movements.  Flipping Cards: Place deck of cards on the table. Flip cards over by opening your hand big to grasp and then turn your palm up big. Deal cards: Hold 1/2 or whole deck in your hand. Use thumb to push card off top of deck with one big push. Rotate ball with fingertips: Pick up with fingers/thumb and move as much as you can with each turn/movement (clockwise and counter-clockwise). Toss ball from one hand to the other: Toss big/high. Pick up coins and stack one at a time: Pick up with big, intentional movements. Do not drag coin to the edge. (5-10 in a stack) Pick up 5-10 coins one at a time and hold in palm. Then, move coins from palm to fingertips one at time and place in coin bank/container. Perform "Flicks"/hand stretches (PWR! Hands): Close hands then flick out your fingers with focus on opening hands, pulling wrists back, and extending elbows like you are pushing.

## 2021-02-25 NOTE — Patient Instructions (Signed)
  Please complete the assigned speech therapy homework prior to your next session and return it to the speech therapist at your next visit.  

## 2021-02-25 NOTE — Therapy (Signed)
[facility] [address] [place], [place], [address] Phone: [phone]   Fax:  [phone]  Occupational Therapy Treatment  Patient Details  Name: Denise [name] MRN: [id number] Date of Birth: 1950-12-02 Referring Provider (OT): Dr. Sarina Ill   Encounter Date: 02/25/2021   OT End of Session - 02/25/21 1107     Visit Number 5    Number of Visits 17    Date for OT Re-Evaluation 04/09/21    Authorization Type BCBS Medicare:  no auth, no visit limit    Authorization - Visit Number 5    Progress Note Due on Visit 10    OT Start Time 1104    OT Stop Time 1145    OT Time Calculation (min) 41 min    Activity Tolerance Patient tolerated treatment well    Behavior During Therapy [facility] for tasks assessed/performed             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   Subjective Assessment - 02/25/21 1300     Subjective  No reports of pain    Pertinent History Parkinson's Disease (diagnosed 2021).  PMH:  osteopenia, glaucoma    Limitations Pt drives from an hour away    Patient Stated Goals improve posture/"drifting to the left"    Currently in Pain? No/denies                                  OT Treatment/ Education - 02/25/21 1137     Education Details PWR! hands, coordination HEP reviewed, pt has handout,  pt returned demonstration, min v.c for big movements.  Reviewed PWR! moves modified quadraped basic 4 min v.c  and demonstration 10 reps each, Reviewed strategy for scooting chair up under table             OT Short Term Goals - 02/25/21 1118       OT SHORT TERM GOAL #1   Title Pt will be independent  with updated PD-specific HEP.--check STGs 03/10/2021    Status Achieved      OT SHORT TERM GOAL #2   Title Pt will improve functional reaching/balance as shown by improving standing functional reach to at least 12" with LUE.    Status On-going      OT SHORT TERM GOAL #3   Title Pt will demo at least 140* shoulder flex for LUE overhead reaching.    Status On-going      OT SHORT TERM GOAL #4   Title Pt will verbalize understanding of ways to decr risk of future complications related to PD.    Status On-going               OT Long Term Goals - 02/25/21 1120       OT LONG TERM GOAL #1   Title Pt will verbalize understanding of updated adaptive strategies/AE to incr ease/safety with ADLs/IADLs.--check LTGs 04/09/21    Time 8    Period Weeks    Status On-going  OT LONG TERM GOAL #2   Title Pt will improve LUE functional reaching/coordination as shown by improving box and blocks score by at least 3.    Baseline 48 blocks    Time 8    Period Weeks    Status On-going      OT LONG TERM GOAL #3   Title Pt will demo at least 140* LUE overhead reaching with at least -5* elbow ext.    Baseline 130* with -15* elbow ext    Time 8    Period Weeks    Status On-going      OT LONG TERM GOAL #4   Title Pt will verbalize understanding of updated/appropriate community resources.    Time 8    Period Weeks    Status On-going      OT LONG TERM GOAL #5   Title Pt will perform functional activity for at least 82mn while maintaining upright posture/avoiding leaning to L.    Time 8    Period Weeks    Status On-going      OT LONG TERM GOAL #6   Title Pt will demonstrate ability to retrieve a lightweight object from overhead shelf with  -10 elbow extension with RUE    Status On-going      OT LONG TERM GOAL #7   Title Pt will increase bilateral UE standing functional reach to 10 inches or greater to minimize fall risk and to increase I with ADLs.    Status On-going                    Plan - 02/25/21 1135     Clinical Impression Statement Pt is progressing towards goals. She demonstrates understanding of PWR! moves modifed quadraped following review. Pt continues to benefit from v.c.  for upright posture.    OT Occupational Profile and History Detailed Assessment- Review of Records and additional review of physical, cognitive, psychosocial history related to current functional performance    Occupational performance deficits (Please refer to evaluation for details): ADL's;IADL's;Leisure;Social Participation    Body Structure / Function / Physical Skills ADL;Balance;Mobility;Strength;Flexibility;UE functional use;FMC;Gait;Coordination;Decreased knowledge of precautions;GMC;ROM;Decreased knowledge of use of DME;Dexterity;IADL;Improper spinal/pelvic alignment    Rehab Potential Good    Clinical Decision Making Several treatment options, min-mod task modification necessary    Comorbidities Affecting Occupational Performance: May have comorbidities impacting occupational performance    Modification or Assistance to Complete Evaluation  Min-Moderate modification of tasks or assist with assess necessary to complete eval    OT Frequency 2x / week    OT Duration 8 weeks   +eval   OT Treatment/Interventions Self-care/ADL training;Therapeutic exercise;Balance training;Functional Mobility Training;Manual Therapy;Neuromuscular education;Therapeutic activities;DME and/or AE instruction;Patient/family education;Passive range of motion;Moist Heat;Cognitive remediation/compensation;Energy conservation;Aquatic Therapy;Splinting;Ultrasound    Plan ADL strategies    Consulted and Agree with Plan of Care Patient             Patient will benefit from skilled therapeutic intervention in order to improve the following deficits and impairments:   Body Structure / Function / Physical Skills: ADL, Balance, Mobility, Strength, Flexibility, UE functional use, FMC, Gait, Coordination,  Decreased knowledge of precautions, GMC, ROM, Decreased knowledge of use of DME, Dexterity, IADL, Improper spinal/pelvic alignment       Visit Diagnosis: Other symptoms and signs involving the nervous system  Abnormal posture  Other symptoms and signs involving the musculoskeletal system  Other lack of coordination  Stiffness of left shoulder, not elsewhere classified  Other abnormalities of gait  and mobility    Problem List Patient Active Problem List   Diagnosis Date Noted   Parkinson's disease ([place]) 02/17/2020   Gait abnormality 12/04/2019   Prediabetes 12/03/2018   Vitamin D deficiency 12/03/2018   Anemia due to vitamin B12 deficiency 12/03/2018   Fatigue 12/03/2018   Urinary frequency 10/01/2018   Vitamin B12 deficiency 10/01/2018   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 [place], [place], [address] Phone: [phone]   Fax:  [phone]  Name: Denise [name] MRN: [id number] Date of Birth: 05-13-1951

## 2021-02-25 NOTE — Therapy (Signed)
[facility] [address] [place], [place], [address] Phone: [phone]   Fax:  [phone]  Speech Language Pathology Treatment  Patient Details  Name: Denise [name] MRN: [id number] Date of Birth: [birth year] Referring Provider (SLP): Sarina Ill, MD   Encounter Date: 02/25/2021   End of Session - 02/25/21 1314     Visit Number 6    Number of Visits 25    Date for SLP Re-Evaluation 04/28/21    SLP Start Time 1148    SLP Stop Time  1230    SLP Time Calculation (min) 42 min    Activity Tolerance Patient tolerated treatment well             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   Subjective Assessment - 02/25/21 1157     Subjective "Denise [name] wanted me to ask you how often she should come." SLP responded with, for ST, every other week.    Currently in Pain? No/denies                   ADULT SLP TREATMENT - 02/25/21 1200       General Information   Behavior/Cognition Alert;Cooperative;Pleasant mood      Treatment Provided   Treatment provided Cognitive-Linquistic      Cognitive-Linquistic Treatment   Treatment focused on Voice;Dysarthria    Skilled Treatment Loud /a/ was utilized by SLP today to recalibrate Theda's volume with average of low-mid 80s dB. Initial cues to stop production of /a/ prior to using residual volume, and making /a/ instead of "uh". Denise [name] read her 65 personally relevant sentences with average mid-upper 70s. SLP cont'd and told pt to cont to recite them this loudly at home BID. SLP also targeted more with abdominal breathing (AB). Pt was 100% successful at rest (seated) so SLP targeted AB with  naming. 100% with AB and notably pt was ata WNL volume as well. SLP pointed this out to pt at this time. As soon as pt stopped practicing AB she reduced volume again to sub-WNL.      Assessment / Recommendations / Plan   Plan Continue with current plan of care      Progression Toward Goals   Progression toward goals Progressing toward goals                SLP Short Term Goals - 02/25/21 1315       SLP SHORT TERM GOAL #1   Title pt will produce loud /a/ with low 80s dB average in 3 sessions    Baseline 02/08/21, 02-16-21    Status Achieved    Target Date 02/26/21      SLP SHORT TERM GOAL #2   Title pt will produce sentence responses 80% WNL loudness in 2 sessions    Status Not Met    Target Date 02/26/21      SLP SHORT TERM GOAL #3   Title pt will use abdominal breathing (AB) in sentence responses 80% of the time in 3 sessions    Status Not Met    Target Date 02/26/21  SLP SHORT TERM GOAL #4   Title pt will produce at least 90-second monologue with loudness average low 70s dB in two sessions    Status Not Met    Target Date 02/26/21              SLP Long Term Goals - 02/25/21 1316       SLP LONG TERM GOAL #1   Title pt will use abdominal breathing (AB) in 5 minutes simple conversation 80% of the time in 3 sessions    Time 7    Period Weeks    Status On-going      SLP LONG TERM GOAL #2   Title pt will use WNL loudness in 5 minutes simple conversation over three sessions    Time 7    Period Weeks    Status On-going      SLP LONG TERM GOAL #3   Title pt will use abdominal breathing (AB) 75% of the time in 10 minutes simple conversation in 3 sessions    Time 11    Period Weeks    Status Revised      SLP LONG TERM GOAL #4   Title pt will use WNL loudness in 10 minutes simple conversation over three sessions    Time 11    Period Weeks    Status On-going              Plan - 02/25/21 1314     Clinical Impression Statement Denise [name] continues to  present today with suboptimal volume levels in conversation. Denise [name] would cont to benefit from skilled ST focusing on improving her speech volume to WNL, and then maintaining consistency of her WNL-volume speech. She may or may not require objective swallow assessment during this therapy course. SLP to cont to monitor.    Speech Therapy Frequency 2x / week    Duration 12 weeks    Treatment/Interventions Aspiration precaution training;Pharyngeal strengthening exercises;Functional tasks;SLP instruction and feedback;Compensatory strategies;Internal/external aids;Cognitive reorganization;Patient/family education;Language facilitation    Potential to Achieve Goals Good    SLP Home Exercise Plan loud /a/ and reading out loud    Consulted and Agree with Plan of Care Patient             Patient will benefit from skilled therapeutic intervention in order to improve the following deficits and impairments:   Dysarthria    Problem List Patient Active Problem List   Diagnosis Date Noted   Parkinson's disease ([place]) 02/17/2020   Gait abnormality 12/04/2019   Prediabetes 12/03/2018   Vitamin D deficiency 12/03/2018   Anemia due to vitamin B12 deficiency 12/03/2018   Fatigue 12/03/2018   Urinary frequency 10/01/2018   Vitamin B12 deficiency 10/01/2018   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 [place], [place], [address] Phone: [phone]   Fax:  [phone]   Name: Denise [name] MRN: [id number] Date of Birth: 13-Jul-1950

## 2021-03-02 ENCOUNTER — Ambulatory Visit: Payer: Medicare Other | Admitting: Occupational Therapy

## 2021-03-02 ENCOUNTER — Encounter: Payer: Self-pay | Admitting: Physical Therapy

## 2021-03-02 ENCOUNTER — Other Ambulatory Visit: Payer: Self-pay

## 2021-03-02 ENCOUNTER — Ambulatory Visit: Payer: Medicare Other | Admitting: Physical Therapy

## 2021-03-02 ENCOUNTER — Ambulatory Visit: Payer: Medicare Other

## 2021-03-02 DIAGNOSIS — R2689 Other abnormalities of gait and mobility: Secondary | ICD-10-CM | POA: Diagnosis not present

## 2021-03-02 DIAGNOSIS — R471 Dysarthria and anarthria: Secondary | ICD-10-CM

## 2021-03-02 DIAGNOSIS — R2681 Unsteadiness on feet: Secondary | ICD-10-CM | POA: Diagnosis not present

## 2021-03-02 DIAGNOSIS — M25612 Stiffness of left shoulder, not elsewhere classified: Secondary | ICD-10-CM | POA: Diagnosis not present

## 2021-03-02 DIAGNOSIS — R29898 Other symptoms and signs involving the musculoskeletal system: Secondary | ICD-10-CM | POA: Diagnosis not present

## 2021-03-02 DIAGNOSIS — R29818 Other symptoms and signs involving the nervous system: Secondary | ICD-10-CM

## 2021-03-02 DIAGNOSIS — M6281 Muscle weakness (generalized): Secondary | ICD-10-CM | POA: Diagnosis not present

## 2021-03-02 DIAGNOSIS — R278 Other lack of coordination: Secondary | ICD-10-CM | POA: Diagnosis not present

## 2021-03-02 DIAGNOSIS — R293 Abnormal posture: Secondary | ICD-10-CM

## 2021-03-02 DIAGNOSIS — R269 Unspecified abnormalities of gait and mobility: Secondary | ICD-10-CM | POA: Diagnosis not present

## 2021-03-02 NOTE — Therapy (Signed)
[facility] [address] [place], [place], [address] Phone: [phone]   Fax:  [phone]  Occupational Therapy Treatment  Patient Details  Name: Denise [name] MRN: [id number] Date of Birth: 09/02/1950 Referring Provider (OT): Dr. 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    Pt denies pain today Treatment: Dynamic step and reach at counter flipping large playing cards  then tossing scarves with right and left UE's  to target, min-mod v.c for amplitude.                      OT Education - 03/02/21 1517     Education Details Diagonals with ball in corner with chair in front for safety, rolling ball up the wall using UE's evenly, Cutting food with big movements, min v.c and pt returned demonstration. education regarding preventing PD related complications    Person(s) Educated Patient    Methods Explanation;Demonstration;Handout;Verbal cues    Comprehension Verbalized understanding;Returned demonstration;Verbal cues required              OT Short Term Goals - 03/02/21 1328       OT SHORT TERM GOAL #1   Title Pt will be independent with updated PD-specific HEP.--check STGs 03/10/2021    Status  Achieved      OT SHORT TERM GOAL #2   Title Pt will improve functional reaching/balance as shown by improving standing functional reach to at least 12" with LUE.    Status On-going      OT SHORT TERM GOAL #3   Title Pt will demo at least 140* shoulder flex for LUE overhead reaching.    Status On-going      OT SHORT TERM GOAL #4   Title Pt will verbalize understanding of ways to decr risk of future complications related to PD.    Status Achieved               OT Long Term Goals - 02/25/21 1120       OT LONG TERM GOAL #1   Title Pt will verbalize understanding of updated adaptive strategies/AE to incr ease/safety with ADLs/IADLs.--check LTGs 04/09/21    Time 8    Period Weeks    Status On-going      OT LONG TERM GOAL #2   Title Pt will improve LUE functional reaching/coordination as shown by improving box and blocks score by at least 3.    Baseline 48  blocks    Time 8    Period Weeks    Status On-going      OT LONG TERM GOAL #3   Title Pt will demo at least 140* LUE overhead reaching with at least -5* elbow ext.    Baseline 130* with -15* elbow ext    Time 8    Period Weeks    Status On-going      OT LONG TERM GOAL #4   Title Pt will verbalize understanding of updated/appropriate community resources.    Time 8    Period Weeks    Status On-going      OT LONG TERM GOAL #5   Title Pt will perform functional activity for at least 24mn while maintaining upright posture/avoiding leaning to L.    Time 8    Period Weeks    Status On-going      OT LONG TERM GOAL #6   Title Pt will demonstrate ability to retrieve a lightweight object from overhead shelf with  -10 elbow extension with RUE    Status On-going      OT LONG TERM GOAL #7   Title Pt will increase bilateral UE standing functional reach to 10 inches or greater to minimize fall risk and to increase I with ADLs.    Status On-going                   Plan - 03/02/21 1520     Clinical Impression  Statement Pt is progressing towards goals. She demonstrates improving functional reach with repetition today.    OT Occupational Profile and History Detailed Assessment- Review of Records and additional review of physical, cognitive, psychosocial history related to current functional performance    Occupational performance deficits (Please refer to evaluation for details): ADL's;IADL's;Leisure;Social Participation    Body Structure / Function / Physical Skills ADL;Balance;Mobility;Strength;Flexibility;UE functional use;FMC;Gait;Coordination;Decreased knowledge of precautions;GMC;ROM;Decreased knowledge of use of DME;Dexterity;IADL;Improper spinal/pelvic alignment    Rehab Potential Good    Clinical Decision Making Several treatment options, min-mod task modification necessary    Comorbidities Affecting Occupational Performance: May have comorbidities impacting occupational performance    Modification or Assistance to Complete Evaluation  Min-Moderate modification of tasks or assist with assess necessary to complete eval    OT Frequency 2x / week    OT Duration 8 weeks   +eval   OT Treatment/Interventions Self-care/ADL training;Therapeutic exercise;Balance training;Functional Mobility Training;Manual Therapy;Neuromuscular education;Therapeutic activities;DME and/or AE instruction;Patient/family education;Passive range of motion;Moist Heat;Cognitive remediation/compensation;Energy conservation;Aquatic Therapy;Splinting;Ultrasound    Plan ADL strategies, functional reaching    Consulted and Agree with Plan of Care Patient             Patient will benefit from skilled therapeutic intervention in order to improve the following deficits and impairments:   Body Structure / Function / Physical Skills: ADL, Balance, Mobility, Strength, Flexibility, UE functional use, FMC, Gait, Coordination, Decreased knowledge of precautions, GMC, ROM, Decreased knowledge of use of DME, Dexterity, IADL, Improper  spinal/pelvic alignment       Visit Diagnosis: Other lack of coordination  Stiffness of left shoulder, not elsewhere classified  Other abnormalities of gait and mobility  Other symptoms and signs involving the musculoskeletal system  Unsteadiness on feet  Other symptoms and signs involving the nervous system    Problem List Patient Active Problem List   Diagnosis Date Noted   Parkinson's disease ([place] 02/17/2020   Gait abnormality 12/04/2019   Prediabetes 12/03/2018   Vitamin D deficiency 12/03/2018   Anemia  due to vitamin B12 deficiency 12/03/2018   Fatigue 12/03/2018   Urinary frequency 10/01/2018   Vitamin B12 deficiency 10/01/2018   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 [place], [place], [address] Phone: [phone]   Fax:  [phone]  Name: Denise [name] MRN: [id number] Date of Birth: 1951/02/04

## 2021-03-02 NOTE — Therapy (Signed)
[facility] [address] [name], [place], [address] Phone: [phone]   Fax:  [phone]  Speech Language Pathology Treatment  Patient Details  Name: Denise [name] MRN: [id number] Date of Birth: [birth year] Referring Provider (SLP): Sarina Ill, MD   Encounter Date: 03/02/2021   End of Session - 03/02/21 2346     Visit Number 7    Number of Visits 25    Date for SLP Re-Evaluation 04/28/21    SLP Start Time 1405    SLP Stop Time  1445    SLP Time Calculation (min) 40 min    Activity Tolerance Patient tolerated treatment well             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   Subjective Assessment - 03/02/21 1418     Subjective "[name]"    Patient is accompained by: Family member   Denise [name]   Currently in Pain? No/denies                   ADULT SLP TREATMENT - 03/02/21 1419       General Information   Behavior/Cognition Alert;Cooperative;Pleasant mood      Treatment Provided   Treatment provided Cognitive-Linquistic      Cognitive-Linquistic Treatment   Treatment focused on Voice;Dysarthria    Skilled Treatment Loud /a/ was utilized by SLP today to recalibrate Denise [name]'s volume with average of upper 80s dB. Denise [name] read her 66 personally relevant sentences with average mid-upper 70s. She read 3 sentences Denise added her own with [name] volume 85% of the time, improved to 100% with nonverbal cues.      Assessment / Recommendations / Plan   Plan Continue with current plan of care      Progression Toward Goals   Progression toward goals Progressing toward goals              SLP Education - 03/02/21  2345     Education Details rationale for loud /a/ Denise for sentences    Person(s) Educated Patient;Child(ren)    Methods Explanation    Comprehension Verbalized understanding              SLP Short Term Goals - 03/02/21 2349       SLP SHORT TERM GOAL #1   Title pt will produce loud /a/ with low 80s dB average in 3 sessions    Baseline 02/08/21, 02-16-21    Status Achieved    Target Date 02/26/21      SLP SHORT TERM GOAL #2   Title pt will produce sentence responses 80% [name] loudness in 2 sessions    Status Not Met    Target Date 02/26/21      SLP SHORT TERM GOAL #3   Title pt will use abdominal breathing (AB) in sentence responses 80% of the time in 3 sessions    Status Not Met    Target Date 02/26/21      SLP SHORT TERM GOAL #4   Title pt will produce at least 90-second monologue with loudness average low 70s dB  in two sessions    Status Not Met    Target Date 02/26/21              SLP Long Term Goals - 03/02/21 2349       SLP LONG TERM GOAL #1   Title pt will use abdominal breathing (AB) in 5 minutes simple conversation 80% of the time in 3 sessions    Time 7    Period Weeks    Status On-going    Target Date 03/26/21      SLP LONG TERM GOAL #2   Title pt will use [name] loudness in 5 minutes simple conversation over three sessions    Time 7    Period Weeks    Status On-going    Target Date 03/26/21      SLP LONG TERM GOAL #3   Title pt will use abdominal breathing (AB) 75% of the time in 10 minutes simple conversation in 3 sessions    Time 11    Period Weeks    Status Revised    Target Date 04/23/21      SLP LONG TERM GOAL #4   Title pt will use [name] loudness in 10 minutes simple conversation over three sessions    Time 11    Period Weeks    Status On-going    Target Date 04/23/21              Plan - 03/02/21 2348     Clinical Impression Statement Denise [name] continues to present today with suboptimal volume levels in conversation. She is  beginning to transfer louder speech into short spontaneous speech tasks. Denise [name], Denise then maintaining consistency of her [name]-volume speech. She may or may not require objective swallow assessment during this therapy course. SLP to cont to monitor.    Speech Therapy Frequency 2x / week    Duration 12 weeks    Treatment/Interventions Aspiration precaution training;Pharyngeal strengthening exercises;Functional tasks;SLP instruction Denise feedback;Compensatory strategies;Internal/external aids;Cognitive reorganization;Patient/family education;Language facilitation    Potential to Achieve Goals Good    SLP Home Exercise Plan loud /a/ Denise reading out loud    Consulted Denise Agree with Plan of Care Patient             Patient will benefit from skilled therapeutic intervention in order to improve the following deficits Denise impairments:   Dysarthria Denise anarthria    Problem List Patient Active Problem List   Diagnosis Date Noted   Parkinson's disease ([place]) 02/17/2020   Gait abnormality 12/04/2019   Prediabetes 12/03/2018   Vitamin D deficiency 12/03/2018   Anemia due to vitamin B12 deficiency 12/03/2018   Fatigue 12/03/2018   Urinary frequency 10/01/2018   Vitamin B12 deficiency 10/01/2018   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 [place], [place], [address] Phone: [phone]   Fax:  [phone]   Name: Denise [name] MRN: [id number] Date of Birth: 1951/02/22

## 2021-03-02 NOTE — Patient Instructions (Signed)
  Please complete the assigned speech therapy homework prior to your next session and return it to the speech therapist at your next visit.  

## 2021-03-02 NOTE — Therapy (Signed)
[facility] [address] [place], [place], [address] Phone: [phone]   Fax:  [phone]  Physical Therapy Treatment  Patient Details  Name: Denise [name] MRN: [id number] Date of Birth: [birth year] Referring Provider (PT): Melvenia Beam, MD   Encounter Date: 03/02/2021   PT End of Session - 03/02/21 1233     Visit Number 6    Number of Visits 13    Date for PT Re-Evaluation 03/29/21    Authorization Type BCBS Medicare    Progress Note Due on Visit 10    PT Start Time 1234    PT Stop Time 1312    PT Time Calculation (min) 38 min    Activity Tolerance Patient tolerated treatment well    Behavior During Therapy [facility] for tasks assessed/performed             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   Subjective Assessment - 03/02/21 1234     Subjective Nothing new since last visit.  No changes.    Pertinent History PD, glaucoma, osteopenia, TB (as a child)    Limitations Walking    Patient Stated Goals wants to know where she is at compared to last time she was here, be more accountable with exercises.    Currently in Pain? No/denies                [facility] PT Assessment - 03/02/21 0001       6 Minute Walk- Baseline   6 Minute Walk- Baseline yes    BP (mmHg) 124/72    HR (bpm) 59    02 Sat (%RA) 98 %      6 Minute walk- Post Test   6 Minute Walk Post Test yes    BP (mmHg) 146/85    HR (bpm) 63    02 Sat (%RA) 99 %    Modified Borg Scale for Dyspnea 0- Nothing at all    Perceived Rate of Exertion (Borg) 9- very light      6 minute walk test results    Aerobic Endurance Distance Walked 1100                            [facility] Adult PT Treatment/Exercise - 03/02/21 0001       Ambulation/Gait   Ambulation/Gait Yes    Ambulation/Gait Assistance 5: Supervision    Ambulation/Gait Assistance Details see 6MWT details    Ambulation Distance (Feet) 230 Feet    Assistive device None    Gait Pattern Step-through pattern;Decreased arm swing - left;Decreased step length - right;Decreased step length - left;Right foot flat;Left foot flat;Decreased trunk rotation;Trunk flexed;Narrow base of support;Poor foot clearance - left;Poor foot clearance - right    Ambulation Surface Level;Indoor    Gait Comments Stops/starts, fast and slow speeds, no LOB      Therapeutic Activites    Therapeutic Activities Other Therapeutic Activities    Other Therapeutic Activities 6MWT and walking program discussion (see instructions for walking program information).  Discussed trying both in and outside.  Balance Exercises - 03/02/21 0001       Balance Exercises: Standing   Standing Eyes Opened Wide (BOA);Foam/compliant surface;Head turns;Limitations;Narrow base of support (BOS)    Standing Eyes Opened Limitations Head turns/nods x 5 reps each 1 UE support at chair. Performed at counter today.    Standing Eyes Closed Foam/compliant surface;Wide (BOA);Narrow base of support (BOS);Limitations    Standing Eyes Closed Limitations Head turns/nods x 5 reps; performed at counter today    Stepping Strategy Posterior;10 reps;Foam/compliant surface;UE support   and solid surface   Stepping Strategy Limitations Cues for upright posture upon return to stand in midline    Other Standing Exercises Anterior<>posterior step and weightshift x 10 reps on solid surface, then standing on [employer], x 10 reps.  Cues for foot clearance and step lenght.               PT Education - 03/02/21 1317     Education Details Walking program    Person(s) Educated Patient    Methods  Explanation;Demonstration;Handout    Comprehension Verbalized understanding;Returned demonstration              PT Short Term Goals - 02/25/21 1027       PT SHORT TERM GOAL #1   Title Pt will be independent with initial HEP for improved strength, balance, transfers, and gait.  ALL STGS DUE 02/25/21    Baseline Occasional cues for technique    Time 4    Period Weeks    Status Partially Met    Target Date 02/25/21      PT SHORT TERM GOAL #2   Title Pt will improve 5x sit<>stand to less than or equal to 13.5 sec to demonstrate improved functional strength and transfer efficiency.    Baseline 14.6 seconds no UE support; 02/25/21:  13.78 sec 1st trial, 12.6 sec 2nd trial    Time 4    Period Weeks    Status Achieved      PT SHORT TERM GOAL #3   Title Pt will improve manual TUG score to less than or equal to 13.5 seconds for decreased fall risk and improved dual tasking    Baseline 14.62 seconds; 02/25/21 12.09 sec    Time 4    Period Weeks    Status Achieved      PT SHORT TERM GOAL #4   Title Pt will recover posterior balance in push and release test in 2 or less steps independently, for improved balance recovery.    Baseline posterior push and release: 3 small steps at eval; 1-2 steps in 3 trials, 02/25/21    Time 4    Period Weeks    Status Achieved               PT Long Term Goals - 01/28/21 1939       PT LONG TERM GOAL #1   Title Pt will be independent with final HEP for improved strength, balance, transfers, and gait. ALL LTGS DUE 03/25/21    Time 8    Period Weeks    Status New    Target Date 03/25/21      PT LONG TERM GOAL #2   Title Pt will verbalize understanding of local Parkinson's disease resources, including options for continue community fitness.    Time 8    Period Weeks    Status New      PT LONG TERM GOAL #3   Title Pt will improve TUG cognitive score to less than  or equal to 18 seconds for decreased fall risk.    Baseline 24.81 seconds    Time  8    Period Weeks    Status New      PT LONG TERM GOAL #4   Title Pt will improve MiniBESTest score to at least 22/28 for decreased fall risk.    Baseline 16/28 at eval on 01/28/21    Time 8    Period Weeks    Status New      PT LONG TERM GOAL #5   Title Pt will ambulate at least 1000 ft, indoors/outdoor surfaces independently for improved community gait.    Time 8    Period Weeks    Status New                   Plan - 03/02/21 1407     Clinical Impression Statement 6 MWT assessed today, with pt ambulating 1100 ft in 6 minute walk (lower than age related normals).  Added walking program instructions for pt's HEP, and addressed balance, especially stepping strategies on varied surfaces.  She will continue to benefit from skilled PT to address posture, balance, gait for improved overall funcitonal mobility.    Personal Factors and Comorbidities Comorbidity 3+;Time since onset of injury/illness/exacerbation;Other   Recent addition of Sinemet to medications   Comorbidities glaucoma, osteopenia, TB (as a child)    Examination-Activity Limitations Transfers;Locomotion Level;Stand    Examination-Participation Restrictions Church;Community Activity    Stability/Clinical Decision Making Stable/Uncomplicated    Rehab Potential Good    PT Frequency 2x / week   12 visits over 8 weeks   PT Duration 6 weeks   12 visits over 8 weeks   PT Treatment/Interventions ADLs/Self Care Home Management;Gait training;Functional mobility training;Stair training;Therapeutic activities;Therapeutic exercise;Balance training;Neuromuscular re-education;DME Instruction;Patient/family education;Vestibular    PT Next Visit Plan review standing PWR moves for HEP as needed. Gait training with focus on posture,arm swing, step length. stepping strategies for balance.  Ask about walking program for home. Continue to work on posterior balance recovery, compliant surfaces..  At the end of week 01/20/9469, recert for  remaining weeks scheduled in [place] (4 additional weeks after this week)    Consulted and Agree with Plan of Care Patient             Patient will benefit from skilled therapeutic intervention in order to improve the following deficits and impairments:  Abnormal gait, Difficulty walking, Impaired tone, Decreased balance, Impaired flexibility, Decreased mobility, Decreased strength, Postural dysfunction  Visit Diagnosis: Other abnormalities of gait and mobility  Abnormal posture  Unsteadiness on feet     Problem List Patient Active Problem List   Diagnosis Date Noted   Parkinson's disease ([place]) 02/17/2020   Gait abnormality 12/04/2019   Prediabetes 12/03/2018   Vitamin D deficiency 12/03/2018   Anemia due to vitamin B12 deficiency 12/03/2018   Fatigue 12/03/2018   Urinary frequency 10/01/2018   Vitamin B12 deficiency 10/01/2018   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 03/02/2021, 2:10 PM Frazier Butt., PT   [facility] [address] [place], [place], [address] Phone: [phone]   Fax:  [phone]  Name: Mikenna Bunkley MRN: [id number] Date of Birth: 1951/03/17

## 2021-03-02 NOTE — Patient Instructions (Signed)
WALKING  Walking is a great form of exercise to increase your strength, endurance and overall fitness.  A walking program can help you start slowly and gradually build endurance as you go.  Everyone's ability is different, so each person's starting point will be different.  You do not have to follow them exactly.  The are just samples. You should simply find out what's right for you and stick to that program.   In the beginning, you'll start off walking 2-3 times a day for short distances.  As you get stronger, you'll be walking further at just 1-2 times per day.  A. You Can Walk For A Certain Length Of Time Each Day    Walk 5 minutes 3 times per day.  Increase 1-2 minutes every 3-4 days (3 times per day).  Work up to 20-25 minutes (1-2 times per day).   Example:   Day 1-2 5 minutes 3 times per day   Day 7-8 8-10 minutes 2-3 times per day   Day 20-21 20-25 minutes 1-2 times per day  FOCUS on tall posture, looking ahead, making sure to pick up feet big; this should help with arm swing and overall better balance.

## 2021-03-02 NOTE — Patient Instructions (Addendum)
                                         Ways to prevent future Parkinson's related complications: 1.   Exercise regularly,  2.   Focus on BIGGER movements during daily activities- really reach overhead, straighten elbows and extend fingers 3.   When dressing or reaching for your seatbelt make sure to use your body to assist by twisting while you reach-this can help to minimize stress on the shoulder and reduce the risk of a rotator cuff tear 4.   Swing your arms when you walk! People with PD are at increased risk for frozen shoulder and swinging your arms can reduce this risk.       Stand in the corner hold a ball between your hand, perform diagonals reaching up to right and down to left, then repeat up to left and down to right 10 reps each  Roll the ball up the wall with both hands evenly 10 reps each

## 2021-03-04 ENCOUNTER — Other Ambulatory Visit: Payer: Self-pay

## 2021-03-04 ENCOUNTER — Encounter: Payer: Self-pay | Admitting: Physical Therapy

## 2021-03-04 ENCOUNTER — Ambulatory Visit: Payer: Medicare Other | Attending: Neurology | Admitting: Physical Therapy

## 2021-03-04 ENCOUNTER — Ambulatory Visit: Payer: Medicare Other | Admitting: Occupational Therapy

## 2021-03-04 ENCOUNTER — Encounter: Payer: Self-pay | Admitting: Occupational Therapy

## 2021-03-04 DIAGNOSIS — M6281 Muscle weakness (generalized): Secondary | ICD-10-CM

## 2021-03-04 DIAGNOSIS — R2681 Unsteadiness on feet: Secondary | ICD-10-CM

## 2021-03-04 DIAGNOSIS — R293 Abnormal posture: Secondary | ICD-10-CM | POA: Diagnosis not present

## 2021-03-04 DIAGNOSIS — R2689 Other abnormalities of gait and mobility: Secondary | ICD-10-CM | POA: Diagnosis not present

## 2021-03-04 DIAGNOSIS — R471 Dysarthria and anarthria: Secondary | ICD-10-CM | POA: Insufficient documentation

## 2021-03-04 DIAGNOSIS — R29898 Other symptoms and signs involving the musculoskeletal system: Secondary | ICD-10-CM | POA: Diagnosis not present

## 2021-03-04 DIAGNOSIS — M25612 Stiffness of left shoulder, not elsewhere classified: Secondary | ICD-10-CM | POA: Insufficient documentation

## 2021-03-04 DIAGNOSIS — R278 Other lack of coordination: Secondary | ICD-10-CM

## 2021-03-04 DIAGNOSIS — R29818 Other symptoms and signs involving the nervous system: Secondary | ICD-10-CM | POA: Diagnosis not present

## 2021-03-04 NOTE — Therapy (Signed)
[facility] [address] [place], [place], [address] Phone: [phone]   Fax:  [phone]  Physical Therapy Treatment/REcert Visit  Patient Details  Name: Denise [name] MRN: [id number] Date of Birth: 30-May-1951 Referring Provider (PT): Melvenia Beam, MD   Encounter Date: 03/04/2021   PT End of Session - 03/04/21 1020     Visit Number 7    Number of Visits 15    Date for PT Re-Evaluation [id number]   per recert [id number]   Authorization Type BCBS Medicare    Progress Note Due on Visit 10    PT Start Time [id number]    PT Stop Time 1101    PT Time Calculation (min) 38 min    Activity Tolerance Patient tolerated treatment well    Behavior During Therapy [facility] for tasks assessed/performed             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   Subjective Assessment - 03/04/21 1020     Subjective Nothing new, no changes.  Have not yet had a chance to do walking program for exercise.    Pertinent History PD, glaucoma, osteopenia, TB (as a child)    Limitations Walking    Patient Stated Goals wants to know where she is at compared to last time she was here, be more accountable with exercises.    Currently in Pain? No/denies                               [facility] Adult PT Treatment/Exercise - 03/04/21 1026       Ambulation/Gait   Ambulation/Gait Yes    Ambulation/Gait Assistance 5: Supervision    Ambulation/Gait Assistance Details cues for increased intensity for posture, arm swing, foot clearance    Ambulation Distance (Feet) 800 Feet   230   Assistive device None    Gait Pattern Step-through pattern;Decreased arm swing -  left;Decreased step length - right;Decreased step length - left;Right foot flat;Left foot flat;Decreased trunk rotation;Trunk flexed;Narrow base of support;Poor foot clearance - left;Poor foot clearance - right    Ambulation Surface Level;Indoor      Posture/Postural Control   Posture/Postural Control Postural limitations    Postural Limitations Forward head;Rounded Shoulders;Posterior pelvic tilt;Weight shift left    Posture Comments SEated posture exercises, finding midline, to lessen L lateral lean:  ant/post pelvic tilts x 10, lateral pelvic tilt x 10, rhythmic staiblization for midline control, Zoom ball for dynamic midline control with minimal cues to return to midline.             Neuro Re-education:  Pt performs PWR! Moves in standing position, 10 reps  PWR! Up for improved posture   PWR! Rock for improved weighshifting    PWR! Twist for improved trunk rotation    PWR! Step for improved step initiation -cues for step height and foot clearance   Minimal Cues provided-verbal, visual, tactile for technique /intensity of movement      Balance Exercises - 03/04/21 0001       Balance Exercises: Standing  Stepping Strategy Anterior;Posterior;Lateral;Foam/compliant surface;UE support;10 reps;Limitations    Stepping Strategy Limitations STanding on blue mat surface, forward and side step strategy stepping over flat black obstacle.  Cues for foot clearance in forward direction at times.    Other Standing Exercises Anterior<>posterior step and weightshift x 10 reps on blue mat surface.                 PT Short Term Goals - 03/04/21 1537       PT SHORT TERM GOAL #1   Title = LTGs               PT Long Term Goals - 03/04/21 1538       PT LONG TERM GOAL #1   Title Pt will be independent with final HEP for improved strength, balance, transfers, and gait. ALL LTGS DUE 04/02/21    Time 4    Period Weeks    Status On-going      PT LONG TERM GOAL #2   Title  Pt will verbalize understanding of local Parkinson's disease resources, including options for continue community fitness.    Time 4    Period Weeks    Status On-going      PT LONG TERM GOAL #3   Title Pt will improve TUG cognitive score to less than or equal to 18 seconds for decreased fall risk.    Baseline 24.81 seconds    Time 4    Period Weeks    Status On-going      PT LONG TERM GOAL #4   Title Pt will improve MiniBESTest score to at least 22/28 for decreased fall risk.    Baseline 16/28 at eval on 01/28/21    Time 4    Period Weeks    Status On-going      PT LONG TERM GOAL #5   Title Pt will ambulate at least 1000 ft, indoors/outdoor surfaces independently for improved community gait.    Time 4    Period Weeks    Status On-going      PT LONG TERM GOAL #6   Title 6 MWT to improve to 1175 ft for improved giat efficiency in community gait    Baseline 1100 ft    Time 4    Period Weeks    Status New                   Plan - 03/04/21 1532     Clinical Impression Statement STGs assessed last week, with pt meeting all STGs.  Pt's initial POC was for 6 weeks, but pt was delayed in initial scheduling due to coordinating multd-disciplinary schedules; so recert completed today for additional 4 weeks for pt's POC.  Pt is progressing towards goals, and pt continues to demo bradykinesia, decreased balance, difficulty on unlevel surfaces.  She will continue to benefit from skilled PT towards LTGs for improved funcitonal mobility, decreased fall risk.    Personal Factors and Comorbidities Comorbidity 3+;Time since onset of injury/illness/exacerbation;Other   Recent addition of Sinemet to medications   Comorbidities glaucoma, osteopenia, TB (as a child)    Examination-Activity Limitations Transfers;Locomotion Level;Stand    Examination-Participation Restrictions Church;Community Activity    Stability/Clinical Decision Making Stable/Uncomplicated    Rehab Potential Good    PT  Frequency 2x / week    PT Duration 4 weeks   per recert [id number]   PT Treatment/Interventions ADLs/Self Care Home Management;Gait training;Functional mobility training;Stair training;Therapeutic activities;Therapeutic exercise;Balance training;Neuromuscular re-education;DME Instruction;Patient/family education;Vestibular  PT Next Visit Plan Gait training with focus on posture,arm swing, step length. stepping strategies for balance.  Ask about walking program for home. Continue to work on posterior balance recovery, compliant surfaces.    Consulted and Agree with Plan of Care Patient             Patient will benefit from skilled therapeutic intervention in order to improve the following deficits and impairments:  Abnormal gait, Difficulty walking, Impaired tone, Decreased balance, Impaired flexibility, Decreased mobility, Decreased strength, Postural dysfunction  Visit Diagnosis: Other abnormalities of gait and mobility  Unsteadiness on feet  Abnormal posture  Other symptoms and signs involving the musculoskeletal system  Muscle weakness (generalized)     Problem List Patient Active Problem List   Diagnosis Date Noted   Parkinson's disease ([place]) 02/17/2020   Gait abnormality 12/04/2019   Prediabetes 12/03/2018   Vitamin D deficiency 12/03/2018   Anemia due to vitamin B12 deficiency 12/03/2018   Fatigue 12/03/2018   Urinary frequency 10/01/2018   Vitamin B12 deficiency 10/01/2018   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 03/04/2021, 3:45 PM Frazier Butt., PT   [facility] [address] [place], [place], [address] Phone: [phone]   Fax:  [phone]  Name: Denise [name] MRN: [id number] Date of Birth: 27-Mar-1951

## 2021-03-04 NOTE — Patient Instructions (Addendum)
     Stand with feet apart, Hold ball with both hands (palms on ball).  Reach down to touch floor, then bring ball overhead.  Focus on keeping head straight and follow ball with eyes.    2.  Then touch knee and bring ball out to opposite side, elbows straight, in diagonal movement.       3.  Stand at wall with feet apart.  elbow on wall (arms straight/parallell).  Slide arms up wall, check head position and keep arms parallel, thumbs up.    4.  Then reach out to side with one arm, move in a rainbow pattern, watch hand.

## 2021-03-04 NOTE — Therapy (Signed)
[facility] [address] [place], [place], [address] Phone: [phone]   Fax:  [phone]  Occupational Therapy Treatment  Patient Details  Name: Denise [name] MRN: [id number] Date of Birth: 1951/04/17 Referring Provider (OT): Dr. Sarina Ill   Encounter Date: 03/04/2021   OT End of Session - 03/04/21 0943     Visit Number 7    Number of Visits 17    Date for OT Re-Evaluation 04/09/21    Authorization Type BCBS Medicare:  no auth, no visit limit    Authorization - Visit Number 7    Authorization - Number of Visits 10    Progress Note Due on Visit 10    OT Start Time [phone]    OT Stop Time 1017    OT Time Calculation (min) 39 min    Activity Tolerance Patient tolerated treatment well    Behavior During Therapy [facility] for tasks assessed/performed             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   Subjective Assessment - 03/04/21 0941     Subjective  pt reports doing well with exercises    Pertinent History Parkinson's Disease (diagnosed 2021).  PMH:  osteopenia, glaucoma    Limitations Pt drives from an hour away    Patient Stated Goals improve posture/"drifting to the left"    Currently in Pain? Yes    Pain Score 3     Pain Location Leg    Pain Orientation Right    Pain Descriptors / Indicators Aching    Pain Type Acute pain    Pain Frequency Intermittent    Aggravating Factors  unknown    Pain Relieving Factors pressure on it              Standing, floor>ceiling and diagonal patterns with BUEs with ball with min-mod verbal/tactile cueing for feet apart, posture/midline alignment, large amplitude movements (also used  mirror for visual cueing).  Standing perform shoulder flex wall slides with BUEs with min-mod verbal/tactile cueing for feet apart, shoulder positioning, midline alignment/posture.  Then performed shoulder abduction and cross body reaching in "rainbow" pattern along wall with min-mod verbal/tactile cueing for large amplitude, posture/positioning.  Standing, functional step and reach forward/back with trunk rotation and overhead reaching with min cueing for incr movement amplitude.  PWR! Moves in modified quadruped (rock, twist) with min cueing for incr movement amplitude.       OT Education - 03/04/21 1050     Education Details Exercises with focus on trunk elongation, trunk rotation, wt. shift, and posture for incr associated trunk movements with UE reaching/movement (handout provided at pt request)    Person(s) Educated Patient    Methods Explanation;Demonstration;Handout;Verbal cues;Tactile cues    Comprehension Verbalized understanding;Returned demonstration;Verbal cues required;Tactile cues required              OT Short Term Goals - 03/02/21 1328       OT SHORT TERM GOAL #1   Title Pt will be independent with updated PD-specific HEP.--check STGs 03/10/2021    Status Achieved  OT SHORT TERM GOAL #2   Title Pt will improve functional reaching/balance as shown by improving standing functional reach to at least 12" with LUE.    Status On-going      OT SHORT TERM GOAL #3   Title Pt will demo at least 140* shoulder flex for LUE overhead reaching.    Status On-going      OT SHORT TERM GOAL #4   Title Pt will verbalize understanding of ways to decr risk of future complications related to PD.    Status Achieved               OT Long Term Goals - 02/25/21 1120       OT LONG TERM GOAL #1   Title Pt will verbalize understanding of updated adaptive strategies/AE to incr ease/safety with ADLs/IADLs.--check LTGs 04/09/21    Time 8    Period Weeks    Status On-going       OT LONG TERM GOAL #2   Title Pt will improve LUE functional reaching/coordination as shown by improving box and blocks score by at least 3.    Baseline 48 blocks    Time 8    Period Weeks    Status On-going      OT LONG TERM GOAL #3   Title Pt will demo at least 140* LUE overhead reaching with at least -5* elbow ext.    Baseline 130* with -15* elbow ext    Time 8    Period Weeks    Status On-going      OT LONG TERM GOAL #4   Title Pt will verbalize understanding of updated/appropriate community resources.    Time 8    Period Weeks    Status On-going      OT LONG TERM GOAL #5   Title Pt will perform functional activity for at least 23mn while maintaining upright posture/avoiding leaning to L.    Time 8    Period Weeks    Status On-going      OT LONG TERM GOAL #6   Title Pt will demonstrate ability to retrieve a lightweight object from overhead shelf with  -10 elbow extension with RUE    Status On-going      OT LONG TERM GOAL #7   Title Pt will increase bilateral UE standing functional reach to 10 inches or greater to minimize fall risk and to increase I with ADLs.    Status On-going                   Plan - 03/04/21 1053     Clinical Impression Statement Pt is progressing towards goals. She demonstrates improving functional reach, posture and midline alignment with repetition today/v.c.    OT Occupational Profile and History Detailed Assessment- Review of Records and additional review of physical, cognitive, psychosocial history related to current functional performance    Occupational performance deficits (Please refer to evaluation for details): ADL's;IADL's;Leisure;Social Participation    Body Structure / Function / Physical Skills ADL;Balance;Mobility;Strength;Flexibility;UE functional use;FMC;Gait;Coordination;Decreased knowledge of precautions;GMC;ROM;Decreased knowledge of use of DME;Dexterity;IADL;Improper spinal/pelvic alignment    Rehab Potential Good     Clinical Decision Making Several treatment options, min-mod task modification necessary    Comorbidities Affecting Occupational Performance: May have comorbidities impacting occupational performance    Modification or Assistance to Complete Evaluation  Min-Moderate modification of tasks or assist with assess necessary to complete eval    OT Frequency 2x / week    OT Duration 8  weeks   +eval   OT Treatment/Interventions Self-care/ADL training;Therapeutic exercise;Balance training;Functional Mobility Training;Manual Therapy;Neuromuscular education;Therapeutic activities;DME and/or AE instruction;Patient/family education;Passive range of motion;Moist Heat;Cognitive remediation/compensation;Energy conservation;Aquatic Therapy;Splinting;Ultrasound    Plan check STGs next week; continue with ADL strategies, focus on posture/midline alignment and functional reaching    Consulted and Agree with Plan of Care Patient             Patient will benefit from skilled therapeutic intervention in order to improve the following deficits and impairments:   Body Structure / Function / Physical Skills: ADL, Balance, Mobility, Strength, Flexibility, UE functional use, FMC, Gait, Coordination, Decreased knowledge of precautions, GMC, ROM, Decreased knowledge of use of DME, Dexterity, IADL, Improper spinal/pelvic alignment       Visit Diagnosis: Other symptoms and signs involving the musculoskeletal system  Unsteadiness on feet  Other symptoms and signs involving the nervous system  Stiffness of left shoulder, not elsewhere classified  Other lack of coordination  Abnormal posture    Problem List Patient Active Problem List   Diagnosis Date Noted   Parkinson's disease ([place]) 02/17/2020   Gait abnormality 12/04/2019   Prediabetes 12/03/2018   Vitamin D deficiency 12/03/2018   Anemia due to vitamin B12 deficiency 12/03/2018   Fatigue 12/03/2018   Urinary frequency 10/01/2018   Vitamin B12  deficiency 10/01/2018   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 [place], [place]  [address] [phone] phone [phone] 03/04/21 1:56 PM

## 2021-03-09 ENCOUNTER — Encounter: Payer: Self-pay | Admitting: Physical Therapy

## 2021-03-09 ENCOUNTER — Encounter: Payer: Self-pay | Admitting: Occupational Therapy

## 2021-03-09 ENCOUNTER — Ambulatory Visit: Payer: Medicare Other

## 2021-03-09 ENCOUNTER — Ambulatory Visit: Payer: Medicare Other | Admitting: Occupational Therapy

## 2021-03-09 ENCOUNTER — Other Ambulatory Visit: Payer: Self-pay

## 2021-03-09 ENCOUNTER — Ambulatory Visit: Payer: Medicare Other | Admitting: Physical Therapy

## 2021-03-09 DIAGNOSIS — M6281 Muscle weakness (generalized): Secondary | ICD-10-CM

## 2021-03-09 DIAGNOSIS — R29898 Other symptoms and signs involving the musculoskeletal system: Secondary | ICD-10-CM

## 2021-03-09 DIAGNOSIS — R2681 Unsteadiness on feet: Secondary | ICD-10-CM | POA: Diagnosis not present

## 2021-03-09 DIAGNOSIS — R293 Abnormal posture: Secondary | ICD-10-CM

## 2021-03-09 DIAGNOSIS — R29818 Other symptoms and signs involving the nervous system: Secondary | ICD-10-CM

## 2021-03-09 DIAGNOSIS — R278 Other lack of coordination: Secondary | ICD-10-CM

## 2021-03-09 DIAGNOSIS — R471 Dysarthria and anarthria: Secondary | ICD-10-CM

## 2021-03-09 DIAGNOSIS — R2689 Other abnormalities of gait and mobility: Secondary | ICD-10-CM | POA: Diagnosis not present

## 2021-03-09 DIAGNOSIS — M25612 Stiffness of left shoulder, not elsewhere classified: Secondary | ICD-10-CM

## 2021-03-09 NOTE — Therapy (Signed)
[facility] [address] [place], [place], [address] Phone: [phone]   Fax:  [phone]  Speech Language Pathology Treatment  Patient Details  Name: Eudell Julian MRN: [id number] Date of Birth: 08/27/1950 Referring Provider (SLP): Sarina Ill, MD   Encounter Date: 03/09/2021   End of Session - 03/09/21 1316     Visit Number 8    Number of Visits 25    Date for SLP Re-Evaluation 04/28/21    SLP Start Time 1149    SLP Stop Time  1230    SLP Time Calculation (min) 41 min    Activity Tolerance Patient tolerated treatment well             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   Subjective Assessment - 03/09/21 1158     Subjective Pt greeted SLP in louder voice.    Currently in Pain? No/denies                   ADULT SLP TREATMENT - 03/09/21 1159       General Information   Behavior/Cognition Alert;Cooperative;Pleasant mood      Treatment Provided   Treatment provided Cognitive-Linquistic      Cognitive-Linquistic Treatment   Treatment focused on Voice;Dysarthria    Skilled Treatment "My daughter says 78 and 2 and so we try to keep the schedule like that (to practice my /a/s)". Pt produced loud /a/ today with average mid-upper 80s dB. Mid-upper 70s dB with everyday sentences. SLP then targeted volume in spntaneous speech by having pt describe pictures in her own words (sequenced); pt average volume was in upper 60s requiring usual mod cues for loudness - initially, pt often with loudness trailing off in her utterances but this decr'd as task progressed. Pt's loudness when describing the sequence improved over the  course of the session - pt ended with low 70s with rare decr into average upper 60s dB. However, no carryover to comments between sequences.      Assessment / Recommendations / Plan   Plan Continue with current plan of care      Progression Toward Goals   Progression toward goals Progressing toward goals                SLP Short Term Goals - 03/02/21 2349       SLP SHORT TERM GOAL #1   Title pt will produce loud /a/ with low 80s dB average in 3 sessions    Baseline 02/08/21, 02-16-21    Status Achieved    Target Date 02/26/21      SLP SHORT TERM GOAL #2   Title pt will produce sentence responses 80% WNL loudness in 2 sessions    Status Not Met    Target Date 02/26/21      SLP SHORT TERM GOAL #3   Title pt will use abdominal breathing (AB) in sentence responses 80% of the time in 3 sessions    Status Not Met    Target Date 02/26/21      SLP SHORT TERM GOAL #4   Title pt  will produce at least 90-second monologue with loudness average low 70s dB in two sessions    Status Not Met    Target Date 02/26/21              SLP Long Term Goals - 03/09/21 1317       SLP LONG TERM GOAL #1   Title pt will use abdominal breathing (AB) in 5 minutes simple conversation 80% of the time in 3 sessions    Time 7    Period Weeks    Status On-going    Target Date 03/26/21      SLP LONG TERM GOAL #2   Title pt will use WNL loudness in 5 minutes simple conversation over three sessions    Time 7    Period Weeks    Status On-going    Target Date 03/26/21      SLP LONG TERM GOAL #3   Title pt will use abdominal breathing (AB) 75% of the time in 10 minutes simple conversation in 3 sessions    Time 11    Period Weeks    Status Revised    Target Date 04/23/21      SLP LONG TERM GOAL #4   Title pt will use WNL loudness in 10 minutes simple conversation over three sessions    Time 11    Period Weeks    Status On-going    Target Date 04/23/21              Plan - 03/09/21  1316     Clinical Impression Statement Etty continues to present today with suboptimal volume levels in conversation. She is beginning to transfer louder speech into short spontaneous speech tasks which today improved as task progressed. Elize would cont to benefit from skilled ST focusing on improving her speech volume to WNL, and then maintaining consistency of her WNL-volume speech. She may or may not require objective swallow assessment during this therapy course. SLP to cont to monitor.    Speech Therapy Frequency 2x / week    Duration 12 weeks    Treatment/Interventions Aspiration precaution training;Pharyngeal strengthening exercises;Functional tasks;SLP instruction and feedback;Compensatory strategies;Internal/external aids;Cognitive reorganization;Patient/family education;Language facilitation    Potential to Achieve Goals Good    SLP Home Exercise Plan loud /a/ and reading out loud    Consulted and Agree with Plan of Care Patient             Patient will benefit from skilled therapeutic intervention in order to improve the following deficits and impairments:   Dysarthria and anarthria    Problem List Patient Active Problem List   Diagnosis Date Noted   Parkinson's disease ([place]) 02/17/2020   Gait abnormality 12/04/2019   Prediabetes 12/03/2018   Vitamin D deficiency 12/03/2018   Anemia due to vitamin B12 deficiency 12/03/2018   Fatigue 12/03/2018   Urinary frequency 10/01/2018   Vitamin B12 deficiency 10/01/2018   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

## 2021-03-09 NOTE — Therapy (Signed)
[facility] [address] [place], [place], [address] Phone: [phone]   Fax:  [phone]  Physical Therapy Treatment  Patient Details  Name: Denise [name] MRN: [id number] Date of Birth: [birth year] Referring Provider (PT): Melvenia Beam, MD   Encounter Date: 03/09/2021   PT End of Session - 03/09/21 1153     Visit Number 8    Number of Visits 15    Date for PT Re-Evaluation [id number]   per recert [id number]   Authorization Type BCBS Medicare    Progress Note Due on Visit 10    PT Start Time 1104    PT Stop Time 1144    PT Time Calculation (min) 40 min    Activity Tolerance Patient tolerated treatment well    Behavior During Therapy [facility] for tasks assessed/performed             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   Subjective Assessment - 03/09/21 1105     Subjective No changes, nothing new.    Pertinent History PD, glaucoma, osteopenia, TB (as a child)    Limitations Walking    Patient Stated Goals wants to know where she is at compared to last time she was here, be more accountable with exercises.    Currently in Pain? No/denies   "pain is gone"                              [facility] Adult PT Treatment/Exercise - 03/09/21 0001       Ambulation/Gait   Ambulation/Gait Yes    Ambulation/Gait Assistance 5: Supervision    Ambulation Distance (Feet) 1000 Feet    Assistive device None    Gait Pattern Step-through pattern;Decreased arm swing - left;Decreased step length - right;Decreased step length - left;Right foot flat;Left foot flat;Decreased trunk rotation;Trunk flexed;Narrow base of support;Poor foot clearance  - left;Poor foot clearance - right    Ambulation Surface Level;Indoor                 Balance Exercises - 03/09/21 0001       Balance Exercises: Standing   Standing Eyes Opened Wide (BOA);Foam/compliant surface;Head turns;Limitations;Narrow base of support (BOS)    Standing Eyes Opened Limitations Head turns/nods x 5 reps each 1 UE support at chair. Performed in corner.    Standing Eyes Closed Foam/compliant surface;Wide (BOA);Narrow base of support (BOS);Limitations    Standing Eyes Closed Limitations Head turns/nods x 5 reps; performed in corner.    Stepping Strategy Anterior;Posterior;Lateral;Foam/compliant surface;UE support;10 reps;Limitations   10 reps solid surface, 10 reps foam surface   Stepping Strategy Limitations STanding on solid surface, forward and side step strategy stepping over flat black obstacle.  Cues for foot clearance in forward direction at times.  On foam surface, all directions, no obstacle, but occasional cues for foot clearance.    Rockerboard Anterior/posterior;EO   Ankle strategy forward/back x 10; alt UE lifts x 10   Rockerboard Limitations stepping on and off board, forward step taps x 10, back step taps x 10.  Other Standing Exercises Anterior<>posterior step and weightshift x 10 reps on blue mat surface.    Other Standing Exercises Comments Progressing to variable perturbations through posterior and posterior lateral directions with black theraband around waist.  Standing on compliant surface (pillows) in corner, EO with alt UE lifts from chair x 10, then repeated with EC; EO with trunk rotation, reach across body to wall x 10 reps               PT Education - 03/09/21 1153     Education Details Cues to "PWR! Up" with best standing posture= better balance    Person(s) Educated Patient    Methods Explanation;Demonstration;Verbal cues    Comprehension Verbalized understanding;Returned demonstration              PT Short Term Goals -  03/04/21 1537       PT SHORT TERM GOAL #1   Title = LTGs               PT Long Term Goals - 03/04/21 1538       PT LONG TERM GOAL #1   Title Pt will be independent with final HEP for improved strength, balance, transfers, and gait. ALL LTGS DUE 04/02/21    Time 4    Period Weeks    Status On-going      PT LONG TERM GOAL #2   Title Pt will verbalize understanding of local Parkinson's disease resources, including options for continue community fitness.    Time 4    Period Weeks    Status On-going      PT LONG TERM GOAL #3   Title Pt will improve TUG cognitive score to less than or equal to 18 seconds for decreased fall risk.    Baseline 24.81 seconds    Time 4    Period Weeks    Status On-going      PT LONG TERM GOAL #4   Title Pt will improve MiniBESTest score to at least 22/28 for decreased fall risk.    Baseline 16/28 at eval on 01/28/21    Time 4    Period Weeks    Status On-going      PT LONG TERM GOAL #5   Title Pt will ambulate at least 1000 ft, indoors/outdoor surfaces independently for improved community gait.    Time 4    Period Weeks    Status On-going      PT LONG TERM GOAL #6   Title 6 MWT to improve to 1175 ft for improved giat efficiency in community gait    Baseline 1100 ft    Time 4    Period Weeks    Status New                   Plan - 03/09/21 1154     Clinical Impression Statement Skilled PT session today focused on compliant surface balance exercises and stepping strategies in varied directions and on varied surfaces.  Pt requires minimal/light UE support for most balance exercises, and she appears more steady when PT gives cues to "PWR! Up" her posture.  She appears to be doing better with foot clearance with step strategy exercises.  She will continue to benefit from skilled PT to further address balance, strength, gait for imrpoved functional mobility and decreased fall risk.    Personal Factors and Comorbidities Comorbidity  3+;Time since onset of injury/illness/exacerbation;Other   Recent addition of Sinemet to medications   Comorbidities glaucoma, osteopenia,  TB (as a child)    Examination-Activity Limitations Transfers;Locomotion Level;Stand    Examination-Participation Restrictions Church;Community Activity    Stability/Clinical Decision Making Stable/Uncomplicated    Rehab Potential Good    PT Frequency 2x / week    PT Duration 4 weeks   per recert [id number]   PT Treatment/Interventions ADLs/Self Care Home Management;Gait training;Functional mobility training;Stair training;Therapeutic activities;Therapeutic exercise;Balance training;Neuromuscular re-education;DME Instruction;Patient/family education;Vestibular    PT Next Visit Plan Gait training with focus on posture,arm swing, step length. stepping strategies for balance.  Continue to work on posterior balance recovery, stepping strategies, compliant surfaces for balance.    Consulted and Agree with Plan of Care Patient             Patient will benefit from skilled therapeutic intervention in order to improve the following deficits and impairments:  Abnormal gait, Difficulty walking, Impaired tone, Decreased balance, Impaired flexibility, Decreased mobility, Decreased strength, Postural dysfunction  Visit Diagnosis: Unsteadiness on feet  Other abnormalities of gait and mobility     Problem List Patient Active Problem List   Diagnosis Date Noted   Parkinson's disease ([place]) 02/17/2020   Gait abnormality 12/04/2019   Prediabetes 12/03/2018   Vitamin D deficiency 12/03/2018   Anemia due to vitamin B12 deficiency 12/03/2018   Fatigue 12/03/2018   Urinary frequency 10/01/2018   Vitamin B12 deficiency 10/01/2018   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 03/09/2021, 11:58 AM Frazier Butt., PT   [facility] [address] [place], [place], [address] Phone: [phone]   Fax:  [phone]  Name: Jameica Miyasato MRN: [id number] Date of Birth: 05-Aug-1950

## 2021-03-09 NOTE — Patient Instructions (Signed)
Continue to practice your homework! Twice a day is best.

## 2021-03-09 NOTE — Therapy (Signed)
[facility] [address] [place], [place], [address] Phone: [phone]   Fax:  [phone]  Occupational Therapy Treatment and Progress Update  Patient Details  Name: Denise [name] MRN: [id number] Date of Birth: Aug 28, 1950 Referring Provider (OT): Dr. Sarina Ill  This progress report covers dates of service between 02/08/21-03/09/21.    Encounter Date: 03/09/2021   OT End of Session - 03/09/21 1107     Visit Number 8    Number of Visits 17    Date for OT Re-Evaluation 04/09/21    Authorization Type BCBS Medicare:  no auth, no visit limit    Authorization - Visit Number 8    Authorization - Number of Visits 10    Progress Note Due on Visit 18    OT Start Time 1017    OT Stop Time 1100    OT Time Calculation (min) 43 min    Activity Tolerance Patient tolerated treatment well    Behavior During Therapy WFL for tasks assessed/performed             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   Subjective Assessment - 03/09/21 1026     Subjective  Patient reports she slept funny last night and has some right hip pain    Pertinent History Parkinson's Disease (diagnosed 2021).  PMH:  osteopenia, glaucoma    Limitations Pt drives from an hour away    Patient Stated Goals improve posture/"drifting to the left"    Currently in Pain? Yes    Pain Score 3     Pain Location Hip    Pain Orientation Right    Pain Descriptors / Indicators Aching    Pain Type Acute pain    Pain Onset In the past 7 days    Pain Frequency Intermittent    Aggravating Factors  standing, weight bearing    Pain Relieving Factors sitting, rest                           OT Treatments/Exercises (OP) - 03/09/21 0001       ADLs   Bathing Discussed shower equipment to build confidence with showering.  Shared resources for suction cup grabbar for point of contact on wall, and patient asking about mat for shower floor to prevent slipping.  Patient is standing to shower, but feels slightly anxious about balance on wet shower floor.    Functional Mobility Sit to stand with emphasis on midline orientation and weight shift forward to stand with weight balanced over mid ofot.  Patient with tendency toward insufficient weight shift forward.  Did well with overy cueing then repetition.  Patient then cued to stand erect - with increased thoracic extension and head upright.  Worked on maintaining tall upright posture to allow fewer shuffling steps.  Reviewed diagonal standing exercises in corner.,  Cueing and facilitation to encourgae slightly increased trunk rotatio and weight shifting with movement of arms.  Patient able to complete safely.  OT Education - 03/09/21 1106     Education Details adaptive equipment to increase safety in shower - grab bar, shower mat, reviewed progress toward short term goals    Person(s) Educated Patient    Methods Explanation    Comprehension Verbalized understanding;Need further instruction              OT Short Term Goals - 03/09/21 1029       OT SHORT TERM GOAL #1   Title Pt will be independent with updated PD-specific HEP.--check STGs 03/10/2021    Status Achieved      OT SHORT TERM GOAL #2   Title Pt will improve functional reaching/balance as shown by improving standing functional reach to at least 12" with LUE.    Status Achieved      OT SHORT TERM GOAL #3   Title Pt will demo at least 140* shoulder flex for LUE overhead reaching.    Status Achieved      OT SHORT TERM GOAL #4   Title Pt will verbalize understanding of ways to decr risk of future complications  related to PD.    Status Achieved               OT Long Term Goals - 03/09/21 1038       OT LONG TERM GOAL #1   Title Pt will verbalize understanding of updated adaptive strategies/AE to incr ease/safety with ADLs/IADLs.--check LTGs 04/09/21    Time 8    Period Weeks    Status On-going      OT LONG TERM GOAL #2   Title Pt will improve LUE functional reaching/coordination as shown by improving box and blocks score by at least 3.    Baseline 48 blocks    Time 8    Period Weeks    Status On-going      OT LONG TERM GOAL #3   Title Pt will demo at least 140* LUE overhead reaching with at least -5* elbow ext.    Baseline 130* with -15* elbow ext    Time 8    Period Weeks    Status On-going      OT LONG TERM GOAL #4   Title Pt will verbalize understanding of updated/appropriate community resources.    Time 8    Period Weeks    Status On-going      OT LONG TERM GOAL #5   Title Pt will perform functional activity for at least 2mn while maintaining upright posture/avoiding leaning to L.    Time 8    Period Weeks    Status On-going      OT LONG TERM GOAL #6   Title Pt will demonstrate ability to retrieve a lightweight object from overhead shelf with  -10 elbow extension with RUE    Status On-going      OT LONG TERM GOAL #7   Title Pt will increase bilateral UE standing functional reach to 10 inches or greater to minimize fall risk and to increase I with ADLs.    Status On-going                   Plan - 03/09/21 1108     Clinical Impression Statement Pt has met all short term goals, and is working toward long term goals.    OT Occupational Profile and History Detailed Assessment- Review of Records and additional review of physical, cognitive, psychosocial history related to current functional performance    Occupational performance deficits (Please  refer to evaluation for details): ADL's;IADL's;Leisure;Social Participation    Body Structure / Function /  Physical Skills ADL;Balance;Mobility;Strength;Flexibility;UE functional use;FMC;Gait;Coordination;Decreased knowledge of precautions;GMC;ROM;Decreased knowledge of use of DME;Dexterity;IADL;Improper spinal/pelvic alignment    Rehab Potential Good    Clinical Decision Making Several treatment options, min-mod task modification necessary    Comorbidities Affecting Occupational Performance: May have comorbidities impacting occupational performance    Modification or Assistance to Complete Evaluation  Min-Moderate modification of tasks or assist with assess necessary to complete eval    OT Frequency 2x / week    OT Duration 8 weeks   +eval   OT Treatment/Interventions Self-care/ADL training;Therapeutic exercise;Balance training;Functional Mobility Training;Manual Therapy;Neuromuscular education;Therapeutic activities;DME and/or AE instruction;Patient/family education;Passive range of motion;Moist Heat;Cognitive remediation/compensation;Energy conservation;Aquatic Therapy;Splinting;Ultrasound    Plan working toward LTG's-  continue with ADL strategies, focus on posture/midline alignment and functional reaching    Consulted and Agree with Plan of Care Patient             Patient will benefit from skilled therapeutic intervention in order to improve the following deficits and impairments:   Body Structure / Function / Physical Skills: ADL, Balance, Mobility, Strength, Flexibility, UE functional use, FMC, Gait, Coordination, Decreased knowledge of precautions, GMC, ROM, Decreased knowledge of use of DME, Dexterity, IADL, Improper spinal/pelvic alignment       Visit Diagnosis: Other symptoms and signs involving the musculoskeletal system  Other symptoms and signs involving the nervous system  Abnormal posture  Unsteadiness on feet  Stiffness of left shoulder, not elsewhere classified  Other lack of coordination  Muscle weakness (generalized)    Problem List Patient Active Problem List    Diagnosis Date Noted   Parkinson's disease ([place]) 02/17/2020   Gait abnormality 12/04/2019   Prediabetes 12/03/2018   Vitamin D deficiency 12/03/2018   Anemia due to vitamin B12 deficiency 12/03/2018   Fatigue 12/03/2018   Urinary frequency 10/01/2018   Vitamin B12 deficiency 10/01/2018   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

## 2021-03-12 ENCOUNTER — Encounter: Payer: Self-pay | Admitting: Occupational Therapy

## 2021-03-12 ENCOUNTER — Ambulatory Visit: Payer: Medicare Other

## 2021-03-12 ENCOUNTER — Encounter: Payer: Self-pay | Admitting: Physical Therapy

## 2021-03-12 ENCOUNTER — Ambulatory Visit: Payer: Medicare Other | Admitting: Occupational Therapy

## 2021-03-12 ENCOUNTER — Other Ambulatory Visit: Payer: Self-pay

## 2021-03-12 ENCOUNTER — Ambulatory Visit: Payer: Medicare Other | Admitting: Physical Therapy

## 2021-03-12 DIAGNOSIS — M25612 Stiffness of left shoulder, not elsewhere classified: Secondary | ICD-10-CM | POA: Diagnosis not present

## 2021-03-12 DIAGNOSIS — R29898 Other symptoms and signs involving the musculoskeletal system: Secondary | ICD-10-CM | POA: Diagnosis not present

## 2021-03-12 DIAGNOSIS — R278 Other lack of coordination: Secondary | ICD-10-CM

## 2021-03-12 DIAGNOSIS — R2681 Unsteadiness on feet: Secondary | ICD-10-CM

## 2021-03-12 DIAGNOSIS — M6281 Muscle weakness (generalized): Secondary | ICD-10-CM

## 2021-03-12 DIAGNOSIS — R29818 Other symptoms and signs involving the nervous system: Secondary | ICD-10-CM | POA: Diagnosis not present

## 2021-03-12 DIAGNOSIS — R2689 Other abnormalities of gait and mobility: Secondary | ICD-10-CM

## 2021-03-12 DIAGNOSIS — R293 Abnormal posture: Secondary | ICD-10-CM | POA: Diagnosis not present

## 2021-03-12 DIAGNOSIS — R471 Dysarthria and anarthria: Secondary | ICD-10-CM | POA: Diagnosis not present

## 2021-03-12 NOTE — Therapy (Signed)
[facility] [address] [place], [place], [address] Phone: [phone]   Fax:  [phone]  Speech Language Pathology Treatment  Patient Details  Name: Denise [name] MRN: [id number] Date of Birth: [birth year] Referring Provider (SLP): Sarina Ill, MD   Encounter Date: 03/12/2021   End of Session - 03/12/21 1015     Visit Number 9    Number of Visits 25    Date for SLP Re-Evaluation 04/28/21    SLP Start Time 1016    SLP Stop Time  1100    SLP Time Calculation (min) 44 min    Activity Tolerance Patient tolerated treatment well             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   Subjective Assessment - 03/12/21 1017     Subjective "I've been practicing"    Currently in Pain? No/denies                   ADULT SLP TREATMENT - 03/12/21 1015       General Information   Behavior/Cognition Alert;Cooperative;Pleasant mood      Treatment Provided   Treatment provided Cognitive-Linquistic      Cognitive-Linquistic Treatment   Treatment focused on Voice;Dysarthria    Skilled Treatment Pt reports having completed HEP BID (loud /a/, sentences, and sequences). In opening conversation, pt averaged mid 71s dB.  Loud /a/ averaged upper 80s to low 90s dB. Reading aloud functional sentences averaged mid 70s dB. SLP targeted reading aloud paragraphs, in which volume was WNL. In Q and A response, pt averaged upper 60s dB, with some rare occurance of 70s dB. SLP emphasized "energy and effort," with intermittent increased volume and intensity noted for low 70s dB. Volume noted to increase when pt was "animated" while story telling.       Assessment / Recommendations / Plan   Plan Continue with current plan of care      Progression Toward Goals   Progression toward goals Progressing toward goals              SLP Education - 03/12/21 1147     Education Details effort and energy, HEP BID    Person(s) Educated Patient    Methods Explanation;Demonstration    Comprehension Verbalized understanding;Returned demonstration              SLP Short Term Goals - 03/02/21 2349       SLP SHORT TERM GOAL #1   Title pt will produce loud /a/ with low 80s dB average in 3 sessions    Baseline 02/08/21, 02-16-21    Status Achieved    Target Date 02/26/21      SLP SHORT TERM GOAL #2   Title pt will produce sentence responses 80% WNL loudness in 2 sessions    Status Not Met    Target Date 02/26/21      SLP SHORT TERM GOAL #3   Title pt will use abdominal breathing (AB) in sentence responses 80% of the time in 3 sessions    Status Not Met    Target  Date 02/26/21      SLP SHORT TERM GOAL #4   Title pt will produce at least 90-second monologue with loudness average low 70s dB in two sessions    Status Not Met    Target Date 02/26/21              SLP Long Term Goals - 03/12/21 1032       SLP LONG TERM GOAL #1   Title pt will use abdominal breathing (AB) in 5 minutes simple conversation 80% of the time in 3 sessions    Time 7    Period Weeks    Status On-going    Target Date 03/26/21      SLP LONG TERM GOAL #2   Title pt will use WNL loudness in 5 minutes simple conversation over three sessions    Time 7    Period Weeks    Status On-going    Target Date 03/26/21      SLP LONG TERM GOAL #3   Title pt will use abdominal breathing (AB) 75% of the time in 10 minutes simple conversation in 3 sessions    Time 11    Period Weeks    Status On-going    Target Date 04/23/21      SLP LONG TERM GOAL #4   Title pt will use WNL loudness in 10 minutes simple conversation over three sessions    Time 11    Period  Weeks    Status On-going    Target Date 04/23/21              Plan - 03/12/21 1034     Clinical Impression Statement Denise [name] continues to present today with suboptimal volume levels in conversation. She is beginning to transfer louder speech into short spontaneous speech tasks and during reading aloud tasks. Denise [name] would cont to benefit from skilled ST focusing on improving her speech volume to WNL, and then maintaining consistency of her WNL-volume speech. She may or may not require objective swallow assessment during this therapy course. SLP to cont to monitor.    Speech Therapy Frequency 2x / week    Duration 12 weeks    Treatment/Interventions Aspiration precaution training;Pharyngeal strengthening exercises;Functional tasks;SLP instruction and feedback;Compensatory strategies;Internal/external aids;Cognitive reorganization;Patient/family education;Language facilitation    Potential to Achieve Goals Good    SLP Home Exercise Plan loud /a/ and reading out loud    Consulted and Agree with Plan of Care Patient             Patient will benefit from skilled therapeutic intervention in order to improve the following deficits and impairments:   Dysarthria and anarthria    Problem List Patient Active Problem List   Diagnosis Date Noted   Parkinson's disease ([place]) 02/17/2020   Gait abnormality 12/04/2019   Prediabetes 12/03/2018   Vitamin D deficiency 12/03/2018   Anemia due to vitamin B12 deficiency 12/03/2018   Fatigue 12/03/2018   Urinary frequency 10/01/2018   Vitamin B12 deficiency 10/01/2018   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 [place], [place], [address] Phone: [phone]   Fax:   [phone]   Name: Denise [name] MRN: [id number] Date of Birth: 02/10/1951

## 2021-03-12 NOTE — Therapy (Signed)
[facility] [address] [place], [place], [address] Phone: [phone]   Fax:  [phone]  Occupational Therapy Treatment  Patient Details  Name: Denise [name] MRN: [id number] Date of Birth: 1950/08/14 Referring Provider (OT): Dr. 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   Subjective Assessment - 03/12/21 1202     Subjective  Denies pain    Pertinent History Parkinson's Disease (diagnosed 2021).  PMH:  osteopenia, glaucoma    Limitations Pt drives from an hour away    Patient Stated Goals improve posture/"drifting to the left"    Currently in Pain? No/denies                Treatment: Standing in corner, diagonals in each direction 10 reps min v.c for amplitude Then rolling ball up the wall with left and right UE's min v.c  Standing to fold laundry and place in laundry basket with emphasis on upright posture and head position, mirror used, min v.c  Placing large to small pegs in semicircle and removing with bilateral UE's simultaneously, min v.c                  OT Education - 03/12/21 1313     Education Details Reviewed [employer]! moves modified quadraped at sink as warm-up min v.c for posture.     Person(s) Educated Patient    Methods Explanation;Demonstration    Comprehension Verbalized understanding;Returned demonstration;Verbal cues required              OT Short Term Goals - 03/09/21 1029       OT SHORT TERM GOAL #1   Title Pt will be independent with updated PD-specific HEP.--check STGs 03/10/2021    Status Achieved      OT SHORT TERM GOAL #2   Title Pt will improve functional reaching/balance as shown by improving standing functional reach to at least 12" with LUE.    Status Achieved      OT SHORT TERM GOAL #3   Title Pt will demo at least 140* shoulder flex for LUE overhead reaching.    Status Achieved      OT SHORT TERM GOAL #4   Title Pt will verbalize understanding of ways to decr risk of future complications related to PD.    Status Achieved               OT Long Term Goals - 03/12/21 1204  OT LONG TERM GOAL #1   Title Pt will verbalize understanding of updated adaptive strategies/AE to incr ease/safety with ADLs/IADLs.--check LTGs 04/09/21    Time 8    Period Weeks    Status On-going      OT LONG TERM GOAL #2   Title Pt will improve LUE functional reaching/coordination as shown by improving box and blocks score by at least 3.    Baseline 48 blocks    Time 8    Period Weeks    Status On-going      OT LONG TERM GOAL #3   Title Pt will demo at least 140* LUE overhead reaching with at least -5* elbow ext.    Baseline 130* with -15* elbow ext    Time 8    Period Weeks    Status On-going      OT LONG TERM GOAL #4   Title Pt will verbalize understanding of updated/appropriate community resources.    Time 8    Period Weeks    Status On-going      OT LONG TERM GOAL #5   Title Pt will perform functional activity for at least 20mn while maintaining upright posture/avoiding leaning to L.    Time 8    Period Weeks    Status On-going      OT LONG TERM GOAL #6   Title Pt will demonstrate ability to retrieve a lightweight object from  overhead shelf with  -10 elbow extension with RUE    Status On-going      OT LONG TERM GOAL #7   Title Pt will increase bilateral UE standing functional reach to 10 inches or greater to minimize fall risk and to increase I with ADLs.    Status On-going                   Plan - 03/12/21 1323     Clinical Impression Statement Pt has met all short term goals, and is working toward long term goals.    OT Occupational Profile and History Detailed Assessment- Review of Records and additional review of physical, cognitive, psychosocial history related to current functional performance    Occupational performance deficits (Please refer to evaluation for details): ADL's;IADL's;Leisure;Social Participation    Body Structure / Function / Physical Skills ADL;Balance;Mobility;Strength;Flexibility;UE functional use;FMC;Gait;Coordination;Decreased knowledge of precautions;GMC;ROM;Decreased knowledge of use of DME;Dexterity;IADL;Improper spinal/pelvic alignment    Rehab Potential Good    Clinical Decision Making Several treatment options, min-mod task modification necessary    Comorbidities Affecting Occupational Performance: May have comorbidities impacting occupational performance    Modification or Assistance to Complete Evaluation  Min-Moderate modification of tasks or assist with assess necessary to complete eval    OT Frequency 2x / week    OT Duration 8 weeks   +eval   OT Treatment/Interventions Self-care/ADL training;Therapeutic exercise;Balance training;Functional Mobility Training;Manual Therapy;Neuromuscular education;Therapeutic activities;DME and/or AE instruction;Patient/family education;Passive range of motion;Moist Heat;Cognitive remediation/compensation;Energy conservation;Aquatic Therapy;Splinting;Ultrasound    Plan working toward LTG's-  continue with ADL strategies, focus on posture/midline alignment and functional reaching    Consulted and Agree with Plan of Care Patient              Patient will benefit from skilled therapeutic intervention in order to improve the following deficits and impairments:   Body Structure / Function / Physical Skills: ADL, Balance, Mobility, Strength, Flexibility, UE functional use, FMC, Gait, Coordination, Decreased knowledge of precautions, GMC, ROM, Decreased knowledge of use of DME, Dexterity, IADL, Improper spinal/pelvic alignment  Visit Diagnosis: Abnormal posture  Unsteadiness on feet  Stiffness of left shoulder, not elsewhere classified  Other lack of coordination  Muscle weakness (generalized)  Other symptoms and signs involving the nervous system    Problem List Patient Active Problem List   Diagnosis Date Noted   Parkinson's disease ([place]) 02/17/2020   Gait abnormality 12/04/2019   Prediabetes 12/03/2018   Vitamin D deficiency 12/03/2018   Anemia due to vitamin B12 deficiency 12/03/2018   Fatigue 12/03/2018   Urinary frequency 10/01/2018   Vitamin B12 deficiency 10/01/2018   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 [place], [place], [address] Phone: [phone]   Fax:  [phone]  Name: Denise [name] MRN: [id number] Date of Birth: 07-31-1950

## 2021-03-14 NOTE — Therapy (Signed)
[facility] [address] [place], [place], [address] Phone: [phone]   Fax:  [phone]  Physical Therapy Treatment  Patient Details  Name: Denise [name] MRN: [id number] Date of Birth: 09-14-1950 Referring Provider (PT): Melvenia Beam, MD   Encounter Date: 03/12/2021   03/12/21 1103  PT Visits / Re-Eval  Visit Number 9  Number of Visits 15  Date for PT Re-Evaluation [id number] (per recert [id number])  Authorization  Authorization Type BCBS Medicare  Progress Note Due on Visit 10  PT Time Calculation  PT Start Time 1102  PT Stop Time 1145  PT Time Calculation (min) 43 min  PT - End of Session  Equipment Utilized During Treatment Gait belt  Activity Tolerance Patient tolerated treatment well  Behavior During Therapy [facility] for tasks assessed/performed     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    03/12/21 1102  Symptoms/Limitations  Subjective No new complaints. No falls or pain to report. Felt okay after last session. States HEP is going well, working on the "power up" with standing.  Pertinent History PD, glaucoma, osteopenia, TB (as a child)  Limitations Walking  Patient Stated Goals wants to know where she is at compared to last time she was here, be more accountable with exercises.  Pain Assessment  Currently in Pain? No/denies  Pain Score 0      03/12/21 1104  Transfers  Transfers Sit to Stand;Stand to Sit  Sit to Stand 6: Modified independent (Device/Increase time);Without upper extremity assist;From bed;From chair/3-in-1  Stand to Sit 6: Modified independent (Device/Increase time);Without upper extremity assist;To bed;To  chair/3-in-1  Ambulation/Gait  Ambulation/Gait Yes  Ambulation/Gait Assistance 5: Supervision  Ambulation/Gait Assistance Details reminder cues needed for increased reciprocal arm swing, left>right side, and for increased step length.  Ambulation Distance (Feet) 1000 Feet (x1, plus around clinic with session)  Assistive device None  Gait Pattern Step-through pattern;Decreased arm swing - left;Decreased step length - right;Decreased step length - left;Right foot flat;Left foot flat;Decreased trunk rotation;Trunk flexed;Narrow base of support;Poor foot clearance - left;Poor foot clearance - right  Ambulation Surface Level;Unlevel;Indoor;Outdoor;Paved  Knee/Hip Exercises: Aerobic  Other Aerobic Scifit level 3.0 x 6 minutes with goal >/= 80 steps per minute for strengthening, activity tolerance and working on increased reciprocal movements.       03/12/21 1122  Balance Exercises: Standing  Standing Eyes Closed Wide (BOA);Head turns;Foam/compliant surface;Other reps (comment);30 secs;Limitations  Standing Eyes Closed Limitations on airex with no UE support with feet hip width apart: EC 30 sec's x 3 reps, progressing to EC head movements left<>right, up<>down for ~10 reps each, min guard to min assist.  Balance Beam standing across red foam beam with single progressing to no UE support for alternating forward stepping to floor/back onto beam, then alternating backward stepping to floor/back onto beam for ~10 reps each, min guard to min assist for balance with cues on weight shifting and posture.  Other Standing Exercises on ramp- solid surface progressing to on blue mat over ramp- facing up ramp, then down the ramp for alternating forward/back stepping for ~10 reps each for  each way on ramp on each surface. Increased assistance needed for balance on compliant surface          PT Short Term Goals - 03/04/21 1537       PT SHORT TERM GOAL #1   Title = LTGs               PT Long Term  Goals - 03/04/21 1538       PT LONG TERM GOAL #1   Title Pt will be independent with final HEP for improved strength, balance, transfers, and gait. ALL LTGS DUE 04/02/21    Time 4    Period Weeks    Status On-going      PT LONG TERM GOAL #2   Title Pt will verbalize understanding of local Parkinson's disease resources, including options for continue community fitness.    Time 4    Period Weeks    Status On-going      PT LONG TERM GOAL #3   Title Pt will improve TUG cognitive score to less than or equal to 18 seconds for decreased fall risk.    Baseline 24.81 seconds    Time 4    Period Weeks    Status On-going      PT LONG TERM GOAL #4   Title Pt will improve MiniBESTest score to at least 22/28 for decreased fall risk.    Baseline 16/28 at eval on 01/28/21    Time 4    Period Weeks    Status On-going      PT LONG TERM GOAL #5   Title Pt will ambulate at least 1000 ft, indoors/outdoor surfaces independently for improved community gait.    Time 4    Period Weeks    Status On-going      PT LONG TERM GOAL #6   Title 6 MWT to improve to 1175 ft for improved giat efficiency in community gait    Baseline 1100 ft    Time 4    Period Weeks    Status New               03/12/21 1104  Plan  Clinical Impression Statement Today's skilled session continued to focus on gait with emphasis on increased arm swing/step length, strengthening and balance training. No issues noted or reported in session. The pt is progressing toward goals and should benefit from continued PT to progress toward unmet goals.  Personal Factors and Comorbidities Comorbidity 3+;Time since onset of injury/illness/exacerbation;Other (Recent addition of Sinemet to medications)  Comorbidities glaucoma, osteopenia, TB (as a child)  Examination-Activity Limitations Transfers;Locomotion Level;Stand  Examination-Participation Restrictions Church;Community Activity  Pt will benefit from skilled therapeutic  intervention in order to improve on the following deficits Abnormal gait;Difficulty walking;Impaired tone;Decreased balance;Impaired flexibility;Decreased mobility;Decreased strength;Postural dysfunction  Stability/Clinical Decision Making Stable/Uncomplicated  Rehab Potential Good  PT Frequency 2x / week  PT Duration 4 weeks (per recert [id number])  PT Treatment/Interventions ADLs/Self Care Home Management;Gait training;Functional mobility training;Stair training;Therapeutic activities;Therapeutic exercise;Balance training;Neuromuscular re-education;DME Instruction;Patient/family education;Vestibular  PT Next Visit Plan 10th visit progress note. Gait training with focus on posture,arm swing, step length. stepping strategies for balance.  Continue to work on posterior balance recovery, stepping strategies, compliant surfaces for balance.  Consulted and Agree with Plan of Care Patient         Patient will benefit from skilled therapeutic intervention in order to improve the following deficits and impairments:  Abnormal gait, Difficulty walking, Impaired tone, Decreased balance, Impaired flexibility, Decreased mobility, Decreased  strength, Postural dysfunction  Visit Diagnosis: Unsteadiness on feet  Abnormal posture  Muscle weakness (generalized)  Other abnormalities of gait and mobility     Problem List Patient Active Problem List   Diagnosis Date Noted   Parkinson's disease ([place]) 02/17/2020   Gait abnormality 12/04/2019   Prediabetes 12/03/2018   Vitamin D deficiency 12/03/2018   Anemia due to vitamin B12 deficiency 12/03/2018   Fatigue 12/03/2018   Urinary frequency 10/01/2018   Vitamin B12 deficiency 10/01/2018   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

## 2021-03-16 ENCOUNTER — Ambulatory Visit: Payer: Medicare Other

## 2021-03-16 ENCOUNTER — Ambulatory Visit: Payer: Medicare Other | Admitting: Physical Therapy

## 2021-03-16 ENCOUNTER — Other Ambulatory Visit: Payer: Self-pay

## 2021-03-16 ENCOUNTER — Ambulatory Visit: Payer: Medicare Other | Admitting: Occupational Therapy

## 2021-03-16 ENCOUNTER — Encounter: Payer: Self-pay | Admitting: Occupational Therapy

## 2021-03-16 DIAGNOSIS — R29818 Other symptoms and signs involving the nervous system: Secondary | ICD-10-CM

## 2021-03-16 DIAGNOSIS — R278 Other lack of coordination: Secondary | ICD-10-CM | POA: Diagnosis not present

## 2021-03-16 DIAGNOSIS — R293 Abnormal posture: Secondary | ICD-10-CM

## 2021-03-16 DIAGNOSIS — R2681 Unsteadiness on feet: Secondary | ICD-10-CM

## 2021-03-16 DIAGNOSIS — R2689 Other abnormalities of gait and mobility: Secondary | ICD-10-CM

## 2021-03-16 DIAGNOSIS — R471 Dysarthria and anarthria: Secondary | ICD-10-CM | POA: Diagnosis not present

## 2021-03-16 DIAGNOSIS — M25612 Stiffness of left shoulder, not elsewhere classified: Secondary | ICD-10-CM

## 2021-03-16 DIAGNOSIS — M6281 Muscle weakness (generalized): Secondary | ICD-10-CM

## 2021-03-16 DIAGNOSIS — R29898 Other symptoms and signs involving the musculoskeletal system: Secondary | ICD-10-CM | POA: Diagnosis not present

## 2021-03-16 NOTE — Therapy (Signed)
[facility] [address] [place], [place], [address] Phone: [phone]   Fax:  [phone]  Occupational Therapy Treatment  Patient Details  Name: Denise [name] MRN: [id number] Date of Birth: 05-29-1951 Referring Provider (OT): Dr. Sarina Ill   Encounter Date: 03/16/2021   OT End of Session - 03/16/21 1025     Visit Number 10    Number of Visits 17    Date for OT Re-Evaluation 04/09/21    Authorization Type BCBS Medicare:  no auth, no visit limit    Authorization - Visit Number 10    Authorization - Number of Visits 10    Progress Note Due on Visit 18    OT Start Time 1025   pt in restroom   OT Stop Time 1103    OT Time Calculation (min) 38 min    Activity Tolerance Patient tolerated treatment well    Behavior During Therapy [facility] for tasks assessed/performed             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   Subjective Assessment - 03/16/21 1025     Subjective  pt reports that she feels like posture has imrproved some and that she is working on it and dtr is cueing her as well    Pertinent History Parkinson's Disease (diagnosed 2021).  PMH:  osteopenia, glaucoma    Limitations Pt drives from an hour away    Patient Stated Goals improve posture/"drifting to the left"    Currently in Pain? No/denies              In modified quadruped, PWR! Moves (up, rock, twist) x10-20 with min cueing for incr movement amplitude and midline alignment/posture  Standing, rock and reach forward/backward with each side (using counter for balance) with min verbal and tactile cueing for UE stretch/large amplitude movements.  (For wt. Shift,  incr arm swing, and functional reach with associated trunk movements).   Standing, rock and reach with twist for trunk rotation, wt. Shift, and large amplitude reach with min verbal and tactile cueing.    Standing, closed-chain shoulder flex, floor>ceiling in front of mirror for visual cueing and min verbal cueing for mid-line alignment/posture.  Sitting, PWR! Hands to slide large cards across table with min cueing, particularly for L hand and 5th digit ext    Flipping large cards with both hands simultaneously for bilateral coordination and timing with min cueing and emphasis on 5th digit ext.  Rotating 2 golf balls in each hand with mod difficulty and cueing for head alignment and turning chin to L hand as pt tends to keep chin to the right no matter which direction she is looking.          OT Short Term Goals - 03/09/21 1029       OT SHORT TERM GOAL #1   Title Pt will be independent with updated PD-specific HEP.--check STGs 03/10/2021    Status Achieved      OT SHORT TERM GOAL #2   Title Pt will improve functional reaching/balance as shown by improving standing functional reach to at  least 12" with LUE.    Status Achieved      OT SHORT TERM GOAL #3   Title Pt will demo at least 140* shoulder flex for LUE overhead reaching.    Status Achieved      OT SHORT TERM GOAL #4   Title Pt will verbalize understanding of ways to decr risk of future complications related to PD.    Status Achieved               OT Long Term Goals - 03/16/21 1027       OT LONG TERM GOAL #1   Title Pt will verbalize understanding of updated adaptive strategies/AE to incr ease/safety with ADLs/IADLs.--check LTGs 04/09/21    Time 8    Period Weeks    Status On-going      OT LONG TERM GOAL #2   Title Pt will improve LUE functional reaching/coordination as shown by improving box and blocks score by at least 3.    Baseline 48 blocks    Time 8    Period Weeks    Status On-going      OT LONG TERM  GOAL #3   Title Pt will demo at least 140* LUE overhead reaching with at least -5* elbow ext.    Baseline 130* with -15* elbow ext    Time 8    Period Weeks    Status Achieved   03/16/21:  140* with -5* elbow ext     OT LONG TERM GOAL #4   Title Pt will verbalize understanding of updated/appropriate community resources.    Time 8    Period Weeks    Status On-going      OT LONG TERM GOAL #5   Title Pt will perform functional activity for at least 55mn while maintaining upright posture/avoiding leaning to L.    Time 8    Period Weeks    Status On-going      OT LONG TERM GOAL #6   Title --    Status --      OT LONG TERM GOAL #7   Title --    Status --                   Plan - 03/16/21 1026     Clinical Impression Statement Reporting Period 02/08/21-03/16/21:  Pt has met all short term goals and LTG #3 and is working toward remaining long term goals.  Pt demo improved awareness of posture and improve end range functional reach.  Pt would benefit form continued occupational therapy to address remaining LTGs to improve ADL/IADL performance, incr safety, and decr risk of future complications.    OT Occupational Profile and History Detailed Assessment- Review of Records and additional review of physical, cognitive, psychosocial history related to current functional performance    Occupational performance deficits (Please refer to evaluation for details): ADL's;IADL's;Leisure;Social Participation    Body Structure / Function / Physical Skills ADL;Balance;Mobility;Strength;Flexibility;UE functional use;FMC;Gait;Coordination;Decreased knowledge of precautions;GMC;ROM;Decreased knowledge of use of DME;Dexterity;IADL;Improper spinal/pelvic alignment    Rehab Potential Good    Clinical Decision Making Several treatment options, min-mod task modification necessary    Comorbidities Affecting Occupational Performance: May have comorbidities impacting occupational performance    Modification  or Assistance to Complete Evaluation  Min-Moderate modification of tasks or assist with assess necessary to complete eval    OT Frequency 2x / week    OT Duration 8 weeks   +eval   OT Treatment/Interventions Self-care/ADL training;Therapeutic exercise;Balance training;Functional  Mobility Training;Manual Therapy;Neuromuscular education;Therapeutic activities;DME and/or AE instruction;Patient/family education;Passive range of motion;Moist Heat;Cognitive remediation/compensation;Energy conservation;Aquatic Therapy;Splinting;Ultrasound    Plan continue working toward LTG's-- continue with ADL strategies, focus on posture/midline alignment and functional reaching    Consulted and Agree with Plan of Care Patient             Patient will benefit from skilled therapeutic intervention in order to improve the following deficits and impairments:   Body Structure / Function / Physical Skills: ADL, Balance, Mobility, Strength, Flexibility, UE functional use, FMC, Gait, Coordination, Decreased knowledge of precautions, GMC, ROM, Decreased knowledge of use of DME, Dexterity, IADL, Improper spinal/pelvic alignment       Visit Diagnosis: Stiffness of left shoulder, not elsewhere classified  Other lack of coordination  Other symptoms and signs involving the nervous system  Unsteadiness on feet  Other abnormalities of gait and mobility  Abnormal posture    Problem List Patient Active Problem List   Diagnosis Date Noted   Parkinson's disease ([place]) 02/17/2020   Gait abnormality 12/04/2019   Prediabetes 12/03/2018   Vitamin D deficiency 12/03/2018   Anemia due to vitamin B12 deficiency 12/03/2018   Fatigue 12/03/2018   Urinary frequency 10/01/2018   Vitamin B12 deficiency 10/01/2018   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 [place], [place], [address] Phone: [phone]   Fax:  [phone]  Name: Melony Tenpas MRN: [id number] Date of Birth: [birth year]  Vianne Bulls, OTR/L [facility] [address] [place], [place]  [address] [phone] phone [phone] 03/16/21 12:14 PM

## 2021-03-16 NOTE — Therapy (Signed)
[facility] [address] [place], [place], [address] Phone: [phone]   Fax:  [phone]  Speech Language Pathology Treatment/medicare progress note  Patient Details  Name: Denise [name] MRN: [id number] Date of Birth: 1951-03-21 Referring Provider (SLP): Sarina Ill, MD   Encounter Date: 03/16/2021   End of Session - 03/16/21 1316     Visit Number 10    Number of Visits 25    Date for SLP Re-Evaluation 04/28/21    SLP Start Time 1148    SLP Stop Time  1230    SLP Time Calculation (min) 42 min    Activity Tolerance Patient tolerated treatment well             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  Objective: See below  Goal Update: See below  Plan: See below  Reason Skilled Services are Required: Pt has not yet reached potential.   There were no vitals filed for this visit.   Subjective Assessment - 03/16/21 1204     Subjective "I've done them ("ah" and sentences) at least once/day, sometimes twice."    Currently in Pain? No/denies                   ADULT SLP TREATMENT - 03/16/21 1205       General Information   Behavior/Cognition Alert;Cooperative;Pleasant mood      Treatment Provided   Treatment provided Cognitive-Linquistic      Cognitive-Linquistic Treatment   Treatment focused on Dysarthria;Voice    Skilled Treatment SLP used loud /a/ averaged upper 80s to low 90s dB. Pt produced everyday sentences averaged mid 70s dB. SLP targeted reading aloud paragraphs, in which volume was  WNL. In multi-sentence responses (3-4), pt averaged upper 60s dB, and req'd SLP verbal and nonverbal cues to incr this volume. SLP told pt to think of the effort needed for loud /a/ and use the same effort level for all conversation.      Assessment / Recommendations / Plan   Plan Continue with current plan of care      Progression Toward Goals   Progression toward goals Progressing toward goals   slow progress with carryover to conversational speech               SLP Short Term Goals - 03/02/21 2349       SLP SHORT TERM GOAL #1   Title pt will produce loud /a/ with low 80s dB average in 3 sessions    Baseline 02/08/21, 02-16-21    Status Achieved    Target Date 02/26/21      SLP SHORT TERM GOAL #2   Title pt will produce sentence responses 80% WNL loudness in 2 sessions    Status Not Met    Target Date 02/26/21      SLP SHORT TERM GOAL #3   Title pt will use abdominal breathing (AB) in sentence responses 80% of the time in 3 sessions    Status Not  Met    Target Date 02/26/21      SLP SHORT TERM GOAL #4   Title pt will produce at least 90-second monologue with loudness average low 70s dB in two sessions    Status Not Met    Target Date 02/26/21              SLP Long Term Goals - 03/16/21 1317       SLP LONG TERM GOAL #1   Title pt will use abdominal breathing (AB) in 5 minutes simple conversation 80% of the time in 3 sessions    Time 7    Period Weeks    Status On-going    Target Date 03/26/21      SLP LONG TERM GOAL #2   Title pt will use WNL loudness in 5 minutes simple conversation over three sessions    Time 7    Period Weeks    Status On-going    Target Date 03/26/21      SLP LONG TERM GOAL #3   Title pt will use abdominal breathing (AB) 75% of the time in 10 minutes simple conversation in 3 sessions    Time 11    Period Weeks    Status On-going    Target Date 04/23/21      SLP LONG TERM GOAL #4   Title pt will use WNL loudness in 10 minutes  simple conversation over three sessions    Time 11    Period Weeks    Status On-going    Target Date 04/23/21              Plan - 03/16/21 1317     Clinical Impression Statement Kristianna continues to present today with suboptimal volume levels in conversation. She is beginning to transfer louder speech into short spontaneous speech tasks and during reading aloud tasks. Dale would cont to benefit from skilled ST focusing on improving her speech volume to WNL, and then maintaining consistency of her WNL-volume speech. She may or may not require objective swallow assessment during this therapy course. SLP to cont to monitor.    Speech Therapy Frequency 2x / week    Duration 12 weeks    Treatment/Interventions Aspiration precaution training;Pharyngeal strengthening exercises;Functional tasks;SLP instruction and feedback;Compensatory strategies;Internal/external aids;Cognitive reorganization;Patient/family education;Language facilitation    Potential to Achieve Goals Good    SLP Home Exercise Plan loud /a/ and reading out loud    Consulted and Agree with Plan of Care Patient             Patient will benefit from skilled therapeutic intervention in order to improve the following deficits and impairments:   Dysarthria and anarthria    Problem List Patient Active Problem List   Diagnosis Date Noted   Parkinson's disease ([place]) 02/17/2020   Gait abnormality 12/04/2019   Prediabetes 12/03/2018   Vitamin D deficiency 12/03/2018   Anemia due to vitamin B12 deficiency 12/03/2018   Fatigue 12/03/2018   Urinary frequency 10/01/2018   Vitamin B12 deficiency 10/01/2018   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

## 2021-03-16 NOTE — Patient Instructions (Signed)
  Please complete the assigned speech therapy homework prior to your next session and return it to the speech therapist at your next visit.  

## 2021-03-16 NOTE — Therapy (Addendum)
[facility] [address] [place], [place], [address] Phone: [phone]   Fax:  [phone]  Physical Therapy Treatment/10th Visit Progress Note  Patient Details  Name: Denise [name] MRN: [id number] Date of Birth: 1950-12-02 Referring Provider (PT): Melvenia Beam, MD  10th Visit Physical Therapy Progress Note  Dates of Reporting Period: 01/28/21 to 03/16/21   Encounter Date: 03/16/2021   PT End of Session - 03/16/21 1105     Visit Number 10    Number of Visits 15    Date for PT Re-Evaluation 81/15/72   per recert 12/03/01   Authorization Type BCBS Medicare    Progress Note Due on Visit 10    PT Start Time 1104    PT Stop Time 1145    PT Time Calculation (min) 41 min    Equipment Utilized During Treatment Gait belt    Activity Tolerance Patient tolerated treatment well    Behavior During Therapy [facility] for tasks assessed/performed             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   Subjective Assessment - 03/16/21 1107     Subjective Is up to walking 10-15 minutes on her walking program. No falls.    Pertinent History PD, glaucoma, osteopenia, TB (as a child)    Limitations Walking    Patient Stated Goals wants to know where she is at compared to last time she was here, be more accountable with exercises.    Currently in Pain? No/denies                [facility] PT Assessment - 03/16/21 1118       Timed Up and Go Test   Cognitive TUG (seconds) 17.38   starting at 77 and counting backwards by 3                          [facility] Adult PT Treatment/Exercise - 03/16/21 1118       Ambulation/Gait    Ambulation/Gait Yes    Ambulation/Gait Assistance 5: Supervision    Ambulation/Gait Assistance Details with 6MWT: working at an 8/10 intensity level. focusing on tall posture and improved stride length    Ambulation Distance (Feet) 1183 Feet   during 6MWT   Assistive device None    Gait Pattern Step-through pattern;Decreased arm swing - left;Decreased step length - right;Decreased step length - left;Right foot flat;Left foot flat;Decreased trunk rotation;Trunk flexed;Narrow base of support;Poor foot clearance - left;Poor foot clearance - right    Ambulation Surface Level;Indoor                 Balance Exercises - 03/16/21 1135       Balance Exercises: Standing   Standing Eyes Opened Foam/compliant surface    Standing Eyes Opened Limitations with feet hip width on blue air ex, 2 x 10 reps head turns, 2 x 10 reps head nods    SLS with Vectors Foam/compliant surface    SLS with Vectors Limitations alternating SLS taps to 6" step with single UE support > none  x10 reps    Standing, One Foot on a Step Eyes open;Limitations    Standing, One Foot on a Step Limitations on blue air ex and stepping foot onto 6" step, 3 reps bilt x15 second holds with cues for PWR Up posture and looking ahead, min guard and intermittent taps to chair for balance           NMR:  PWR Up Flow Seated Pwr Up > sit to stand > Standing Pwr Up > Seated X6 reps  standing on blue air ex, needing BUE support to stand and cues for incr nose over toes to stand      PT Education - 03/16/21 1144     Education Details progress towards goals    Person(s) Educated Patient    Methods Explanation    Comprehension Verbalized understanding              PT Short Term Goals - 03/04/21 1537       PT SHORT TERM GOAL #1   Title = LTGs               PT Long Term Goals - 03/16/21 1118       PT LONG TERM GOAL #1   Title Pt will be independent with final HEP for improved strength, balance, transfers, and  gait. ALL LTGS DUE 04/02/21    Time 4    Period Weeks    Status On-going      PT LONG TERM GOAL #2   Title Pt will verbalize understanding of local Parkinson's disease resources, including options for continue community fitness.    Time 4    Period Weeks    Status On-going      PT LONG TERM GOAL #3   Title Pt will improve TUG cognitive score to less than or equal to 18 seconds for decreased fall risk.    Baseline 24.81 seconds; 17.38 seconds on 03/16/21    Time 4    Period Weeks    Status Achieved      PT LONG TERM GOAL #4   Title Pt will improve MiniBESTest score to at least 22/28 for decreased fall risk.    Baseline 16/28 at eval on 01/28/21    Time 4    Period Weeks    Status On-going      PT LONG TERM GOAL #5   Title Pt will ambulate at least 1000 ft, indoors/outdoor surfaces independently for improved community gait.    Time 4    Period Weeks    Status On-going      PT LONG TERM GOAL #6   Title 6 MWT to improve to 1175 ft for improved giat efficiency in community gait    Baseline 1100 ft; 1183' on 03/16/21    Time 4    Period Weeks    Status Achieved                   Plan - 03/16/21 1235     Clinical Impression Statement 10th visit PN: Assessed pt's 6MWT as a warm up and pt has met LTG #6, pt has improved her distance to 1183' (previously 1100'). Pt more aware of having tall posture (PWR Up) and taking incr stride length during gait. Pt reports also performing her walking program at home. Pt improved cog TUG to 17.38 seconds (previously 24.81 seconds), indicating improved ability at dual tasking, but still puts pt at an incr risk for falls. Remainder of session focused on  balance strategies on compliant surfaces for posture and SLS. Will continue to progrss towards LTGs.    Personal Factors and Comorbidities Comorbidity 3+;Time since onset of injury/illness/exacerbation;Other   Recent addition of Sinemet to medications   Comorbidities glaucoma, osteopenia, TB  (as a child)    Examination-Activity Limitations Transfers;Locomotion Level;Stand    Examination-Participation Restrictions Church;Community Activity    Stability/Clinical Decision Making Stable/Uncomplicated    Rehab Potential Good    PT Frequency 2x / week    PT Duration 4 weeks   per recert 03/12/3569   PT Treatment/Interventions ADLs/Self Care Home Management;Gait training;Functional mobility training;Stair training;Therapeutic activities;Therapeutic exercise;Balance training;Neuromuscular re-education;DME Instruction;Patient/family education;Vestibular    PT Next Visit Plan Gait training with focus on posture,arm swing, step length.  Continue to work on posterior balance recovery, stepping strategies, compliant surfaces for balance. Gait/balance with dual tasking.    Consulted and Agree with Plan of Care Patient             Patient will benefit from skilled therapeutic intervention in order to improve the following deficits and impairments:  Abnormal gait, Difficulty walking, Impaired tone, Decreased balance, Impaired flexibility, Decreased mobility, Decreased strength, Postural dysfunction  Visit Diagnosis: Abnormal posture  Unsteadiness on feet  Muscle weakness (generalized)  Other abnormalities of gait and mobility     Problem List Patient Active Problem List   Diagnosis Date Noted   Parkinson's disease ([place]) 02/17/2020   Gait abnormality 12/04/2019   Prediabetes 12/03/2018   Vitamin D deficiency 12/03/2018   Anemia due to vitamin B12 deficiency 12/03/2018   Fatigue 12/03/2018   Urinary frequency 10/01/2018   Vitamin B12 deficiency 10/01/2018   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

## 2021-03-18 ENCOUNTER — Ambulatory Visit: Payer: Medicare Other | Admitting: Physical Therapy

## 2021-03-18 ENCOUNTER — Other Ambulatory Visit: Payer: Self-pay

## 2021-03-18 ENCOUNTER — Ambulatory Visit: Payer: Medicare Other | Admitting: Occupational Therapy

## 2021-03-18 ENCOUNTER — Encounter: Payer: Self-pay | Admitting: Occupational Therapy

## 2021-03-18 ENCOUNTER — Encounter: Payer: Self-pay | Admitting: Physical Therapy

## 2021-03-18 DIAGNOSIS — R2689 Other abnormalities of gait and mobility: Secondary | ICD-10-CM

## 2021-03-18 DIAGNOSIS — R2681 Unsteadiness on feet: Secondary | ICD-10-CM

## 2021-03-18 DIAGNOSIS — R293 Abnormal posture: Secondary | ICD-10-CM

## 2021-03-18 DIAGNOSIS — R29898 Other symptoms and signs involving the musculoskeletal system: Secondary | ICD-10-CM

## 2021-03-18 DIAGNOSIS — R278 Other lack of coordination: Secondary | ICD-10-CM | POA: Diagnosis not present

## 2021-03-18 DIAGNOSIS — M25612 Stiffness of left shoulder, not elsewhere classified: Secondary | ICD-10-CM

## 2021-03-18 DIAGNOSIS — R29818 Other symptoms and signs involving the nervous system: Secondary | ICD-10-CM | POA: Diagnosis not present

## 2021-03-18 DIAGNOSIS — M6281 Muscle weakness (generalized): Secondary | ICD-10-CM | POA: Diagnosis not present

## 2021-03-18 DIAGNOSIS — R471 Dysarthria and anarthria: Secondary | ICD-10-CM | POA: Diagnosis not present

## 2021-03-18 NOTE — Therapy (Signed)
[facility] [address] [place], [place], [address] Phone: [phone]   Fax:  [phone]  Physical Therapy Treatment  Patient Details  Name: Denise [name] MRN: [id number] Date of Birth: Feb 05, 1951 Referring Provider (PT): Melvenia Beam, MD   Encounter Date: 03/18/2021   PT End of Session - 03/18/21 1202     Visit Number 11    Number of Visits 15    Date for PT Re-Evaluation [id number]   per recert [id number]   Authorization Type BCBS Medicare    Progress Note Due on Visit 10    PT Start Time 1102    PT Stop Time 1145    PT Time Calculation (min) 43 min    Activity Tolerance Patient tolerated treatment well    Behavior During Therapy [facility] for tasks assessed/performed             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   Subjective Assessment - 03/18/21 1105     Subjective Nothing new this visit.    Pertinent History PD, glaucoma, osteopenia, TB (as a child)    Limitations Walking    Patient Stated Goals wants to know where she is at compared to last time she was here, be more accountable with exercises.    Currently in Pain? No/denies                               [facility] Adult PT Treatment/Exercise - 03/18/21 0001       Ambulation/Gait   Ambulation/Gait Yes    Ambulation/Gait Assistance 6: Modified independent (Device/Increase time)    Ambulation/Gait Assistance Details Outdoor gait on sidewalk surfaces, cues for foot clearance at times and posture.    Ambulation Distance (Feet) 1000 Feet   indoors, then outdoors   Assistive device None    Gait Pattern Step-through pattern;Decreased arm swing - left;Decreased  step length - right;Decreased step length - left;Right foot flat;Left foot flat;Decreased trunk rotation;Trunk flexed;Narrow base of support;Poor foot clearance - left;Poor foot clearance - right    Ambulation Surface Level;Indoor      Self-Care   Self-Care Other Self-Care Comments    Other Self-Care Comments  Provided pt with handout for and discussed local Parkinson's resources, especially in regards to ongoing community fitness.  With pt being in [place] area, she is looking into going to [place] at [place] or [place].  Discussed use of aerobic equipemnt there.  Discussed other options for continued fitness including [employer] cycling class, PWR! Moves exercise classes, PD [employer], and the new drumming for PD class in [place].      Neuro Re-ed    Neuro Re-ed Details  PWR! Up Flow:  Seated PWR! Up>sit to stand PWR! Up, then standing PWR! Up x 5 reps, 2 sets                 Balance Exercises - 03/18/21 0001       Balance Exercises: Standing   Standing Eyes Opened Foam/compliant surface;Narrow base of support (BOS);Limitations  Standing Eyes Opened Limitations Standing on Airex, head turns x 5, head nods x 5    SLS with Vectors Solid surface;Foam/compliant surface;Limitations    SLS with Vectors Limitations Standing on solid surface:  alternating SLS taps to 6" step with no UE support x10 reps, then to 12" step x 10 reps; then standing on Airex alt step taps to 6" step with min UE support    Standing, One Foot on a Step Eyes open;Limitations    Standing, One Foot on a Step Limitations Standing on blue airex:  foot propped with head turns x 5, then head nods x 5, then alt UE lifts x 5 (foot propped position x 3 reps, each leg to perform each of the above)                  PT Short Term Goals - 03/04/21 1537       PT SHORT TERM GOAL #1   Title = LTGs               PT Long Term Goals - 03/16/21 1118       PT LONG TERM GOAL #1   Title Pt  will be independent with final HEP for improved strength, balance, transfers, and gait. ALL LTGS DUE 04/02/21    Time 4    Period Weeks    Status On-going      PT LONG TERM GOAL #2   Title Pt will verbalize understanding of local Parkinson's disease resources, including options for continue community fitness.    Time 4    Period Weeks    Status On-going      PT LONG TERM GOAL #3   Title Pt will improve TUG cognitive score to less than or equal to 15.5 seconds for decreased fall risk.    Baseline 24.81 seconds; 17.38 seconds on 03/16/21    Time 4    Period Weeks    Status Revised      PT LONG TERM GOAL #4   Title Pt will improve MiniBESTest score to at least 22/28 for decreased fall risk.    Baseline 16/28 at eval on 01/28/21    Time 4    Period Weeks    Status On-going      PT LONG TERM GOAL #5   Title Pt will ambulate at least 1000 ft, indoors/outdoor surfaces independently for improved community gait.    Time 4    Period Weeks    Status On-going      PT LONG TERM GOAL #6   Title 6 MWT to improve to 1175 ft for improved giat efficiency in community gait    Baseline 1100 ft; 1183' on 03/16/21    Time 4    Period Weeks    Status Achieved                   Plan - 03/18/21 1203     Clinical Impression Statement Today's skilled PT session focused on gait training indoor and outdoor surfaces, balance activities and education on continued community fitness options post discharge.  Pt is making overall good progress towards goals and will continue to benefit from skilled PT to further address balance, posture, gait towards LTGs.    Personal Factors and Comorbidities Comorbidity 3+;Time since onset of injury/illness/exacerbation;Other   Recent addition of Sinemet to medications   Comorbidities glaucoma, osteopenia, TB (as a child)    Examination-Activity Limitations Transfers;Locomotion Level;Stand    Examination-Participation Restrictions Church;Community Activity  Stability/Clinical Decision Making Stable/Uncomplicated    Rehab Potential Good    PT Frequency 2x / week    PT Duration 4 weeks   per recert [id number]   PT Treatment/Interventions ADLs/Self Care Home Management;Gait training;Functional mobility training;Stair training;Therapeutic activities;Therapeutic exercise;Balance training;Neuromuscular re-education;DME Instruction;Patient/family education;Vestibular    PT Next Visit Plan Gait training with focus on posture,arm swing, step length.  Continue to work on posterior balance recovery, stepping strategies, compliant surfaces for balance. Gait/balance with dual tasking.  Work towards [employer] and ask if pt has had any thoughts on continued community fitness.    Consulted and Agree with Plan of Care Patient             Patient will benefit from skilled therapeutic intervention in order to improve the following deficits and impairments:  Abnormal gait, Difficulty walking, Impaired tone, Decreased balance, Impaired flexibility, Decreased mobility, Decreased strength, Postural dysfunction  Visit Diagnosis: Other abnormalities of gait and mobility  Abnormal posture  Unsteadiness on feet     Problem List Patient Active Problem List   Diagnosis Date Noted   Parkinson's disease ([place]) 02/17/2020   Gait abnormality 12/04/2019   Prediabetes 12/03/2018   Vitamin D deficiency 12/03/2018   Anemia due to vitamin B12 deficiency 12/03/2018   Fatigue 12/03/2018   Urinary frequency 10/01/2018   Vitamin B12 deficiency 10/01/2018   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 [place], [place], [address] Phone: [phone]   Fax:  [phone]  Name: Franshesca Tepper MRN:  [id number] Date of Birth: December 13, 1950

## 2021-03-18 NOTE — Therapy (Signed)
[facility] [address] [place], [place], [address] Phone: [phone]   Fax:  [phone]  Occupational Therapy Treatment  Patient Details  Name: Denise [name] MRN: [id number] Date of Birth: Nov 23, 1950 Referring Provider (OT): Dr. Sarina Ill   Encounter Date: 03/18/2021   OT End of Session - 03/18/21 1021     Visit Number 11    Number of Visits 17    Date for OT Re-Evaluation 04/09/21    Authorization Type BCBS Medicare:  no auth, no visit limit    Authorization - Visit Number 11    Authorization - Number of Visits 20    Progress Note Due on Visit 20    OT Start Time 1023    OT Stop Time 1101    OT Time Calculation (min) 38 min    Activity Tolerance Patient tolerated treatment well    Behavior During Therapy [facility] for tasks assessed/performed             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   Subjective Assessment - 03/18/21 1021     Subjective  denies pain    Pertinent History Parkinson's Disease (diagnosed 2021).  PMH:  osteopenia, glaucoma    Limitations Pt drives from an hour away    Patient Stated Goals improve posture/"drifting to the left"    Currently in Pain? No/denies              Standing, rock and reach forward/backward with each side (using counter for balance) with min verbal and tactile cueing for UE stretch/large amplitude movements.  (For wt. Shift, incr arm swing, and functional reach with associated trunk movements).   Standing, rock and reach with twist for trunk rotation, wt. Shift, and large amplitude reach with min verbal and tactile cueing.    Standing, closed-chain shoulder flex, floor>ceiling  and diagonals with in front of mirror for visual cueing and min -mod verbal cueing for mid-line alignment/posture.  Standing, functional high overhead reaching and reaching in ER with trunk rotation and multi-directional  wt. Shift with min cueing for large amplitude movements.  Sitting, placing small pegs in pegboard with each finger/thumb of each hand for incr coordination, with PWR! Step hands with mod cueing for head position.            OT Short Term Goals - 03/09/21 1029       OT SHORT TERM GOAL #1   Title Pt will be independent with updated PD-specific HEP.--check STGs 03/10/2021    Status Achieved      OT SHORT TERM GOAL #2   Title Pt will improve functional reaching/balance as shown by improving standing functional reach to at least 12" with LUE.    Status Achieved      OT SHORT TERM GOAL #3   Title Pt will demo at least 140* shoulder flex for LUE overhead reaching.    Status Achieved      OT SHORT TERM GOAL #4   Title Pt will verbalize understanding of ways to decr risk of future complications related to PD.    Status Achieved  OT Long Term Goals - 03/16/21 1027       OT LONG TERM GOAL #1   Title Pt will verbalize understanding of updated adaptive strategies/AE to incr ease/safety with ADLs/IADLs.--check LTGs 04/09/21    Time 8    Period Weeks    Status On-going      OT LONG TERM GOAL #2   Title Pt will improve LUE functional reaching/coordination as shown by improving box and blocks score by at least 3.    Baseline 48 blocks    Time 8    Period Weeks    Status On-going      OT LONG TERM GOAL #3   Title Pt will demo at least 140* LUE overhead reaching with at least -5* elbow ext.    Baseline 130* with -15* elbow ext    Time 8    Period Weeks    Status Achieved   03/16/21:  140* with -5* elbow ext     OT LONG TERM GOAL #4   Title Pt will verbalize understanding of updated/appropriate community resources.    Time 8    Period Weeks     Status On-going      OT LONG TERM GOAL #5   Title Pt will perform functional activity for at least 49mn while maintaining upright posture/avoiding leaning to L.    Time 8    Period Weeks    Status On-going      OT LONG TERM GOAL #6   Title --    Status --      OT LONG TERM GOAL #7   Title --    Status --                   Plan - 03/18/21 1022     Clinical Impression Statement Pt is progressing towards goals with improving awarenes of head/trunk positioning, but continues to need min cueing particularly for coordination tasks.    OT Occupational Profile and History Detailed Assessment- Review of Records and additional review of physical, cognitive, psychosocial history related to current functional performance    Occupational performance deficits (Please refer to evaluation for details): ADL's;IADL's;Leisure;Social Participation    Body Structure / Function / Physical Skills ADL;Balance;Mobility;Strength;Flexibility;UE functional use;FMC;Gait;Coordination;Decreased knowledge of precautions;GMC;ROM;Decreased knowledge of use of DME;Dexterity;IADL;Improper spinal/pelvic alignment    Rehab Potential Good    Clinical Decision Making Several treatment options, min-mod task modification necessary    Comorbidities Affecting Occupational Performance: May have comorbidities impacting occupational performance    Modification or Assistance to Complete Evaluation  Min-Moderate modification of tasks or assist with assess necessary to complete eval    OT Frequency 2x / week    OT Duration 8 weeks   +eval   OT Treatment/Interventions Self-care/ADL training;Therapeutic exercise;Balance training;Functional Mobility Training;Manual Therapy;Neuromuscular education;Therapeutic activities;DME and/or AE instruction;Patient/family education;Passive range of motion;Moist Heat;Cognitive remediation/compensation;Energy conservation;Aquatic Therapy;Splinting;Ultrasound    Plan continue working toward  LTG's-- continue with ADL strategies, focus on posture/midline alignment and functional reaching    Consulted and Agree with Plan of Care Patient             Patient will benefit from skilled therapeutic intervention in order to improve the following deficits and impairments:   Body Structure / Function / Physical Skills: ADL, Balance, Mobility, Strength, Flexibility, UE functional use, FMC, Gait, Coordination, Decreased knowledge of precautions, GMC, ROM, Decreased knowledge of use of DME, Dexterity, IADL, Improper spinal/pelvic alignment       Visit Diagnosis: Other symptoms and signs involving  the nervous system  Stiffness of left shoulder, not elsewhere classified  Unsteadiness on feet  Abnormal posture  Other abnormalities of gait and mobility  Other lack of coordination  Other symptoms and signs involving the musculoskeletal system    Problem List Patient Active Problem List   Diagnosis Date Noted   Parkinson's disease ([place]) 02/17/2020   Gait abnormality 12/04/2019   Prediabetes 12/03/2018   Vitamin D deficiency 12/03/2018   Anemia due to vitamin B12 deficiency 12/03/2018   Fatigue 12/03/2018   Urinary frequency 10/01/2018   Vitamin B12 deficiency 10/01/2018   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 [place], [place], [address] Phone: [phone]   Fax:  [phone]  Name: Maryjayne Beston MRN: [id number] Date of Birth: [birth year]  Vianne Bulls, OTR/L [facility] [address] [place], [place]  [address] [phone] phone [phone] 03/18/21 12:57 PM

## 2021-03-19 ENCOUNTER — Ambulatory Visit (INDEPENDENT_AMBULATORY_CARE_PROVIDER_SITE_OTHER): Payer: Medicare Other

## 2021-03-19 VITALS — Wt 130.0 lb

## 2021-03-19 DIAGNOSIS — Z0000 Encounter for general adult medical examination without abnormal findings: Secondary | ICD-10-CM

## 2021-03-19 NOTE — Progress Notes (Addendum)
Subjective:   Denise [name] is a 70 y.o. female who presents for Medicare Annual (Subsequent) preventive examination.  Review of Systems    Ms. Denise [name] , Thank you for taking time to come for your Medicare Wellness Visit. I appreciate your ongoing commitment to your health goals. Please review the following plan we discussed and let me know if I can assist you in the future.  Virtual Visit via Telephone Note     I connected with  Denise [name]  on 03/19/21 at  2:00 PM EDT by telephone and verified that I am speaking with the correct person using two identifiers.     [name]   Patient: home  Provider: office  Persons participating in the virtual visit: patient/Nurse Health Advisor     I discussed the limitations, risks, security and privacy concerns of performing an evaluation and management service by telephone and the availability of in person appointments. The patient expressed understanding and agreed to proceed.     Interactive audio and video telecommunications were attempted between this nurse and patient, however failed, due to patient having technical difficulties OR patient did not have access to video capability.  We continued and completed visit with audio only.    These are the goals we discussed:  Goals   None     This is a list of the screening recommended for you and due dates:  Health Maintenance  Topic Date Due   Zoster (Shingles) Vaccine (1 of 2) Never done   Flu Shot  02/01/2021   Mammogram  06/08/2021   Colon Cancer Screening  07/09/2025   Tetanus Vaccine  05/02/2027   DEXA scan (bone density measurement)  Completed   COVID-19 Vaccine  Completed   Hepatitis C Screening: USPSTF Recommendation to screen - Ages 58-79 yo.  Completed   HPV Vaccine  Aged Out          Objective:    Today's Vitals   03/19/21 1315 03/19/21 1316  Weight:  130 lb (59 kg)  PainSc: 0-No pain 0-No pain   Body mass index is 20.98 kg/m.  Advanced  Directives 02/08/2021 01/28/2021 03/13/2020 02/28/2020 07/01/2015 04/20/2015  Does Patient Have a Medical Advance Directive? Yes Yes Yes Yes Yes Yes  Type of Paramedic of [place];Living will - [place];Living will [place];Living will [place];Living will [place]  Does patient want to make changes to medical advance directive? - - - No - Patient declined No - Patient declined -  Copy of [place] in Chart? - - - - No - copy requested -    Current Medications (verified) Outpatient Encounter Medications as of 03/19/2021  Medication Sig   atorvastatin (LIPITOR) 10 MG tablet TAKE 1 TABLET BY MOUTH AT BEDTIME MON - FRI   carbidopa-levodopa (SINEMET IR) 25-100 MG tablet Take at 8,12 and 4pm. Try to avoid protein.   Cholecalciferol (VITAMIN D3) 10 MCG (400 UNIT) CAPS Take 1 capsule by mouth daily. Pt unaware of dose   hydrochlorothiazide (HYDRODIURIL) 12.5 MG tablet Take 1 tablet by mouth daily as needed for leg swelling   timolol (BETIMOL) 0.25 % ophthalmic solution 1-2 drops daily.    Travoprost, BAK Free, (TRAVATAN) 0.004 % SOLN ophthalmic solution 1 drop at bedtime.   Calcium Carb-Cholecalciferol (OYSTER SHELL CALCIUM/VITAMIN D) 500-200 MG-UNIT PACK Take by mouth.   No facility-administered encounter medications on file as of 03/19/2021.    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   Past Surgical History:  Procedure Laterality Date   TUBAL LIGATION     Family History  Problem Relation Age of Onset   Hypertension Mother    Heart disease Mother    Diabetes Father    Hypertension Father    Other Father        had a  condition that was "a cousin" to ALS    Diabetes Sister    Tuberculosis Maternal Grandmother    Heart disease Sister    Colitis Daughter    Colon cancer Neg Hx    Colon polyps Neg Hx    Esophageal cancer Neg Hx    Rectal cancer Neg Hx    Stomach cancer Neg Hx    Social History   Socioeconomic History   Marital status: Single    Spouse name: Not on file   Number of children: 3   Years of education: Not on file   Highest education level: Master's degree (e.g., MA, MS, MEng, MEd, MSW, MBA)  Occupational History   Not on file  Tobacco Use   Smoking status: Never   Smokeless tobacco: Never  Vaping Use   Vaping Use: Never used  Substance and Sexual Activity   Alcohol use: Never   Drug use: No   Sexual activity: Not Currently    Birth control/protection: Post-menopausal  Other Topics Concern   Not on file  Social History Narrative   Lives with daughter and grandaughter    Right handed   Caffeine: none   Social Determinants of Health   Financial Resource Strain: Not on file  Food Insecurity: Not on file  Transportation Needs: Not on file  Physical Activity: Not on file  Stress: Not on file  Social Connections: Not on file    Tobacco Counseling Counseling given: Not Answered   Clinical Intake:  Pre-visit preparation completed: Yes  Pain : No/denies pain Pain Score: 0-No pain     Diabetes: No  How often do you need to have someone help you when you read instructions, pamphlets, or other written materials from your doctor or pharmacy?: 3 - Sometimes What is the last grade level you completed in school?: Masters  Diabetic? No.   Interpreter Needed?: No      Activities of Daily Living In your present state of health, do you have any difficulty performing the following activities: 03/19/2021 08/04/2020  Hearing? (No Data) N  Comment sometimes -  Vision? N N  Difficulty concentrating or making decisions? (No Data) Y  Comment sometimes -  Walking or  climbing stairs? N N  Dressing or bathing? N N  Doing errands, shopping? N N  Some recent data might be hidden    Patient Care Team: Minette Brine, FNP as PCP - General (General Practice)  Indicate any recent Medical Services you may have received from other than [facility] providers in the past year (date may be approximate).     Assessment:   This is a routine wellness examination for [place].  Hearing/Vision screen No results found.  Dietary issues and exercise activities discussed: Current Exercise Habits: Home exercise routine, Time (Minutes): 30, Frequency (Times/Week): 3, Weekly Exercise (Minutes/Week): 90, Intensity:  Mild, Exercise limited by: None identified   Goals Addressed   None    Depression Screen PHQ 2/9 Scores 03/19/2021 08/04/2020 07/31/2019 12/03/2018 08/24/2018 08/17/2018 05/24/2018  PHQ - 2 Score 0 0 0 0 0 0 0  PHQ- 9 Score - - 0 - - - -    Fall Risk Fall Risk  03/19/2021 12/21/2020 08/04/2020 07/31/2019 12/03/2018  Falls in the past year? 0 0 0 0 0  Number falls in past yr: 0 - - - -  Injury with Fall? 0 - - - -  Risk for fall due to : No Fall Risks - - - -  Follow up Falls evaluation completed - - - -    FALL RISK PREVENTION PERTAINING TO THE HOME:  Any stairs in or around the home? Yes  If so, are there any without handrails? Yes  Home free of loose throw rugs in walkways, pet beds, electrical cords, etc? No  Adequate lighting in your home to reduce risk of falls? Yes   ASSISTIVE DEVICES UTILIZED TO PREVENT FALLS:  Life alert? Yes  Use of a cane, walker or w/c? No  Grab bars in the bathroom? No  Shower chair or bench in shower? No  Elevated toilet seat or a handicapped toilet? No   TIMED UP AND GO:  Was the test performed?  pt had an over the phone visit .  Length of time to ambulate 10 feet: n/a sec.   Gait steady and fast without use of assistive device  Cognitive Function:     6CIT Screen 03/19/2021 03/19/2021  What Year? 0 points 0 points  What  month? 0 points 0 points  What time? 0 points 0 points  Count back from 20 0 points 0 points  Months in reverse 0 points 0 points  Repeat phrase 0 points 0 points  Total Score 0 0    Immunizations Immunization History  Administered Date(s) Administered   Fluad Quad(high Dose 65+) 05/13/2019, 04/07/2020   Influenza, Seasonal, Injecte, Preservative Fre 03/17/2015   Influenza,inj,Quad PF,6+ Mos 03/17/2015, 05/07/2018   Moderna Sars-Covid-2 Vaccination 08/31/2019, 09/28/2019   PFIZER Comirnaty(Gray Top)Covid-19 Tri-Sucrose Vaccine 02/15/2021   PFIZER(Purple Top)SARS-COV-2 Vaccination 05/23/2020, 02/15/2021   Tdap 05/01/2017    TDAP status: Up to date  Flu Vaccine status: Due, Education has been provided regarding the importance of this vaccine. Advised may receive this vaccine at local pharmacy or Health Dept. Aware to provide a copy of the vaccination record if obtained from local pharmacy or Health Dept. Verbalized acceptance and understanding.  Pneumococcal vaccine status: Due, Education has been provided regarding the importance of this vaccine. Advised may receive this vaccine at local pharmacy or Health Dept. Aware to provide a copy of the vaccination record if obtained from local pharmacy or Health Dept. Verbalized acceptance and understanding.  Covid-19 vaccine status: Completed vaccines  Qualifies for Shingles Vaccine? Yes   Zostavax completed No   Shingrix Completed?: No.    Education has been provided regarding the importance of this vaccine. Patient has been advised to call insurance company to determine out of pocket expense if they have not yet received this vaccine. Advised may also receive vaccine at local pharmacy or Health Dept. Verbalized acceptance and understanding.  Screening Tests Health Maintenance  Topic Date Due   Zoster Vaccines- Shingrix (1 of 2) Never done   INFLUENZA VACCINE  02/01/2021   MAMMOGRAM  06/08/2021   COLONOSCOPY (Pts 45-23yr Insurance  coverage will need to be confirmed)  07/09/2025  TETANUS/TDAP  05/02/2027   DEXA SCAN  Completed   COVID-19 Vaccine  Completed   Hepatitis C Screening  Completed   HPV VACCINES  Aged Out    Health Maintenance  Health Maintenance Due  Topic Date Due   Zoster Vaccines- Shingrix (1 of 2) Never done   INFLUENZA VACCINE  02/01/2021    Colorectal cancer screening: No longer required.   Mammogram status: No longer required due to n.a.  Bone Density status: Completed n/a . Results reflect: Bone density results: NORMAL. Repeat every 1 years.  Lung Cancer Screening: (Low Dose CT Chest recommended if Age 19-80 years, 30 pack-year currently smoking OR have quit w/in 15years.) does not qualify.   Lung Cancer Screening Referral: N/A  Additional Screening:  Hepatitis C Screening: does qualify; Completed n/a  Vision Screening: Recommended annual ophthalmology exams for early detection of glaucoma and other disorders of the eye. Is the patient up to date with their annual eye exam?  Yes  Who is the provider or what is the name of the office in which the patient attends annual eye exams? [facility] If pt is not established with a provider, would they like to be referred to a provider to establish care? No .   Dental Screening: Recommended annual dental exams for proper oral hygiene  Community Resource Referral / Chronic Care Management: CRR required this visit?  No   CCM required this visit?  No      Plan:     I have personally reviewed and noted the following in the patient's chart:   Medical and social history Use of alcohol, tobacco or illicit drugs  Current medications and supplements including opioid prescriptions.  Functional ability and status Nutritional status Physical activity Advanced directives List of other physicians Hospitalizations, surgeries, and ER visits in previous 12 months Vitals Screenings to include cognitive, depression, and falls Referrals and  appointments  In addition, I have reviewed and discussed with patient certain preventive protocols, quality metrics, and best practice recommendations. A written personalized care plan for preventive services as well as general preventive health recommendations were provided to patient.     Debbora Dus, [place]   03/19/2021   Nurse Notes: successful

## 2021-03-23 ENCOUNTER — Other Ambulatory Visit: Payer: Self-pay

## 2021-03-23 ENCOUNTER — Ambulatory Visit: Payer: Medicare Other | Admitting: Occupational Therapy

## 2021-03-23 ENCOUNTER — Encounter: Payer: Self-pay | Admitting: Occupational Therapy

## 2021-03-23 ENCOUNTER — Encounter: Payer: Self-pay | Admitting: Physical Therapy

## 2021-03-23 ENCOUNTER — Ambulatory Visit: Payer: Medicare Other

## 2021-03-23 ENCOUNTER — Ambulatory Visit: Payer: Medicare Other | Admitting: Physical Therapy

## 2021-03-23 DIAGNOSIS — R2681 Unsteadiness on feet: Secondary | ICD-10-CM

## 2021-03-23 DIAGNOSIS — M25612 Stiffness of left shoulder, not elsewhere classified: Secondary | ICD-10-CM

## 2021-03-23 DIAGNOSIS — R293 Abnormal posture: Secondary | ICD-10-CM

## 2021-03-23 DIAGNOSIS — R29898 Other symptoms and signs involving the musculoskeletal system: Secondary | ICD-10-CM | POA: Diagnosis not present

## 2021-03-23 DIAGNOSIS — R2689 Other abnormalities of gait and mobility: Secondary | ICD-10-CM

## 2021-03-23 DIAGNOSIS — M6281 Muscle weakness (generalized): Secondary | ICD-10-CM | POA: Diagnosis not present

## 2021-03-23 DIAGNOSIS — R29818 Other symptoms and signs involving the nervous system: Secondary | ICD-10-CM

## 2021-03-23 DIAGNOSIS — R278 Other lack of coordination: Secondary | ICD-10-CM | POA: Diagnosis not present

## 2021-03-23 DIAGNOSIS — R471 Dysarthria and anarthria: Secondary | ICD-10-CM

## 2021-03-23 NOTE — Therapy (Signed)
[facility] [address] [place], [place], [address] Phone: [phone]   Fax:  [phone]  Speech Language Pathology Treatment  Patient Details  Name: Denise [name] MRN: [id number] Date of Birth: [birth year] Referring Provider (SLP): Sarina Ill, MD   Encounter Date: 03/23/2021   End of Session - 03/23/21 1622     Visit Number 11    Number of Visits 25    Date for SLP Re-Evaluation 04/28/21    SLP Start Time 1319    SLP Stop Time  1400    SLP Time Calculation (min) 41 min    Activity Tolerance Patient tolerated treatment well             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   Subjective Assessment - 03/23/21 1327     Subjective "It's coming along. Denise [name] does a good job with me."    Currently in Pain? No/denies                   ADULT SLP TREATMENT - 03/23/21 1327       General Information   Behavior/Cognition Alert;Cooperative;Pleasant mood      Treatment Provided   Treatment provided Cognitive-Linquistic      Cognitive-Linquistic Treatment   Treatment focused on Dysarthria;Voice    Skilled Treatment SLP used loud /a/ to habitualize conversational volume loudness, and pt averaged upper 80s to low 90s dB. Pt produced everyday sentences averaged low 80s dB. SLP targeted reading aloud multiple paragraphs, in which volume was WNL after one min nonverbal cue from SLP x3.  SLP reminded pt to think of the effort needed for loud /a/ and use the same effort level for all conversation. Some minimal carryover to conversation between tasks today. SLP encouraged pt to cont to practice at home.      Assessment /  Recommendations / Plan   Plan Continue with current plan of care      Progression Toward Goals   Progression toward goals Progressing toward goals                SLP Short Term Goals - 03/02/21 2349       SLP SHORT TERM GOAL #1   Title pt will produce loud /a/ with low 80s dB average in 3 sessions    Baseline 02/08/21, 02-16-21    Status Achieved    Target Date 02/26/21      SLP SHORT TERM GOAL #2   Title pt will produce sentence responses 80% WNL loudness in 2 sessions    Status Not Met    Target Date 02/26/21      SLP SHORT TERM GOAL #3   Title pt will use abdominal breathing (AB) in sentence responses 80% of the time in 3 sessions    Status Not Met    Target Date 02/26/21      SLP SHORT TERM GOAL #4   Title pt will produce at least 90-second monologue with loudness average low 70s dB in two sessions    Status Not Met    Target Date 02/26/21  SLP Long Term Goals - 03/23/21 1623       SLP LONG TERM GOAL #1   Title pt will use abdominal breathing (AB) in 5 minutes simple conversation 80% of the time in 3 sessions    Time 7    Period Weeks    Status On-going    Target Date 03/26/21      SLP LONG TERM GOAL #2   Title pt will use WNL loudness in 5 minutes simple conversation over three sessions    Time 7    Period Weeks    Status On-going    Target Date 03/26/21      SLP LONG TERM GOAL #3   Title pt will use abdominal breathing (AB) 75% of the time in 10 minutes simple conversation in 3 sessions    Time 11    Period Weeks    Status On-going    Target Date 04/23/21      SLP LONG TERM GOAL #4   Title pt will use WNL loudness in 10 minutes simple conversation over three sessions    Time 11    Period Weeks    Status On-going    Target Date 04/23/21              Plan - 03/23/21 1622     Clinical Impression Statement Denise [name] continues to present today with suboptimal volume levels in conversation. She is beginning to transfer louder  speech into short episodes of spontaneous speech between therapy tasks. Denise [name] would cont to benefit from skilled ST focusing on improving her speech volume to WNL, and then maintaining consistency of her WNL-volume speech. She may or may not require objective swallow assessment during this therapy course. SLP to cont to monitor.    Speech Therapy Frequency 2x / week    Duration 12 weeks    Treatment/Interventions Aspiration precaution training;Pharyngeal strengthening exercises;Functional tasks;SLP instruction and feedback;Compensatory strategies;Internal/external aids;Cognitive reorganization;Patient/family education;Language facilitation    Potential to Achieve Goals Good    SLP Home Exercise Plan loud /a/ and reading out loud    Consulted and Agree with Plan of Care Patient             Patient will benefit from skilled therapeutic intervention in order to improve the following deficits and impairments:   Dysarthria    Problem List Patient Active Problem List   Diagnosis Date Noted   Parkinson's disease ([place]) 02/17/2020   Gait abnormality 12/04/2019   Prediabetes 12/03/2018   Vitamin D deficiency 12/03/2018   Anemia due to vitamin B12 deficiency 12/03/2018   Fatigue 12/03/2018   Urinary frequency 10/01/2018   Vitamin B12 deficiency 10/01/2018   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 [place], [place], [address] Phone: [phone]   Fax:  [phone]   Name: Denise [name] MRN: [id number] Date of Birth: 1951/02/15

## 2021-03-23 NOTE — Therapy (Signed)
[facility] [address] [place], [place], [address] Phone: [phone]   Fax:  [phone]  Occupational Therapy Treatment  Patient Details  Name: Denise [name] MRN: [id number] Date of Birth: 01-04-1951 Referring Provider (OT): Dr. Sarina Ill   Encounter Date: 03/23/2021   OT End of Session - 03/23/21 1231     Visit Number 12    Number of Visits 17    Date for OT Re-Evaluation 04/09/21    Authorization Type BCBS Medicare:  no auth, no visit limit    Authorization - Visit Number 12    Authorization - Number of Visits 20    Progress Note Due on Visit 20    OT Start Time 1233    OT Stop Time 1314    OT Time Calculation (min) 41 min    Activity Tolerance Patient tolerated treatment well    Behavior During Therapy [facility] for tasks assessed/performed             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   Subjective Assessment - 03/23/21 1231     Subjective  denies pain    Pertinent History Parkinson's Disease (diagnosed 2021).  PMH:  osteopenia, glaucoma    Limitations Pt drives from an hour away    Patient Stated Goals improve posture/"drifting to the left"    Currently in Pain? No/denies                PWR! Moves (up, rock, twist) in supine  x 10-20 each with min-mod cues For incr movement amplitude and midline alignment.  PWR! Moves (up, rock, twist) in quadruped x10-20 each with min-mod cueing for incr movement amplitude and midline alignment/posture.    Sitting, shoulder flex with BUEs with ball with min cueing (verbal/tactile) for mid-line alignment/posture.  Sitting, functional reaching in ER and overhead with each UE  incorporating and emphasizing lateral and forward wt. Shift with reach as well as trunk rotation with min-mod verbal and tactile cueing for lateral wt. Shift to the R.  Also cued pt to avoid forward head flex with trunk rotation and reach.  Pt demo incr functional reach with wt. Shift.  Pt educated on importance or posture and wt. shift to help with UE movement and functional tasks.           OT Short Term Goals - 03/09/21 1029       OT SHORT TERM GOAL #1   Title Pt will be independent with updated PD-specific HEP.--check STGs 03/10/2021    Status Achieved      OT SHORT TERM GOAL #2   Title Pt will improve functional reaching/balance as shown by improving standing functional reach to at least 12" with LUE.    Status Achieved      OT SHORT TERM GOAL #3   Title Pt will demo at least 140* shoulder flex for LUE overhead reaching.    Status Achieved      OT SHORT TERM GOAL #4   Title Pt will verbalize understanding of ways to decr risk of future complications related to PD.  Status Achieved               OT Long Term Goals - 03/16/21 1027       OT LONG TERM GOAL #1   Title Pt will verbalize understanding of updated adaptive strategies/AE to incr ease/safety with ADLs/IADLs.--check LTGs 04/09/21    Time 8    Period Weeks    Status On-going      OT LONG TERM GOAL #2   Title Pt will improve LUE functional reaching/coordination as shown by improving box and blocks score by at least 3.    Baseline 48 blocks    Time 8    Period Weeks    Status On-going      OT LONG TERM GOAL #3   Title Pt will demo at least 140* LUE overhead reaching with at least -5* elbow ext.    Baseline 130* with -15* elbow ext    Time 8    Period Weeks    Status Achieved   03/16/21:  140* with -5* elbow ext     OT LONG TERM GOAL #4   Title Pt will verbalize understanding of updated/appropriate community resources.    Time 8    Period Weeks    Status On-going      OT LONG TERM GOAL #5   Title  Pt will perform functional activity for at least 51min while maintaining upright posture/avoiding leaning to L.    Time 8    Period Weeks    Status On-going      OT LONG TERM GOAL #6   Title --    Status --      OT LONG TERM GOAL #7   Title --    Status --                   Plan - 03/23/21 1231     Clinical Impression Statement Pt is progressing towards goals with improving awarenes of head/trunk positioning, and demo incr shoulder ROM as a result.    OT Occupational Profile and History Detailed Assessment- Review of Records and additional review of physical, cognitive, psychosocial history related to current functional performance    Occupational performance deficits (Please refer to evaluation for details): ADL's;IADL's;Leisure;Social Participation    Body Structure / Function / Physical Skills ADL;Balance;Mobility;Strength;Flexibility;UE functional use;FMC;Gait;Coordination;Decreased knowledge of precautions;GMC;ROM;Decreased knowledge of use of DME;Dexterity;IADL;Improper spinal/pelvic alignment    Rehab Potential Good    Clinical Decision Making Several treatment options, min-mod task modification necessary    Comorbidities Affecting Occupational Performance: May have comorbidities impacting occupational performance    Modification or Assistance to Complete Evaluation  Min-Moderate modification of tasks or assist with assess necessary to complete eval    OT Frequency 2x / week    OT Duration 8 weeks   +eval   OT Treatment/Interventions Self-care/ADL training;Therapeutic exercise;Balance training;Functional Mobility Training;Manual Therapy;Neuromuscular education;Therapeutic activities;DME and/or AE instruction;Patient/family education;Passive range of motion;Moist Heat;Cognitive remediation/compensation;Energy conservation;Aquatic Therapy;Splinting;Ultrasound    Plan continue working toward LTG's-- continue with ADL strategies, focus on posture/midline alignment and  functional reaching    Consulted and Agree with Plan of Care Patient             Patient will benefit from skilled therapeutic intervention in order to improve the following deficits and impairments:   Body Structure / Function / Physical Skills: ADL, Balance, Mobility, Strength, Flexibility, UE functional use, FMC, Gait, Coordination, Decreased knowledge of precautions, GMC, ROM, Decreased knowledge of use of DME, Dexterity, IADL, Improper spinal/pelvic  alignment       Visit Diagnosis: Other symptoms and signs involving the nervous system  Stiffness of left shoulder, not elsewhere classified  Unsteadiness on feet  Abnormal posture  Other abnormalities of gait and mobility  Other lack of coordination  Other symptoms and signs involving the musculoskeletal system    Problem List Patient Active Problem List   Diagnosis Date Noted   Parkinson's disease ([place]) 02/17/2020   Gait abnormality 12/04/2019   Prediabetes 12/03/2018   Vitamin D deficiency 12/03/2018   Anemia due to vitamin B12 deficiency 12/03/2018   Fatigue 12/03/2018   Urinary frequency 10/01/2018   Vitamin B12 deficiency 10/01/2018   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 [place], [place]  [address] [phone] phone [phone] 03/23/21 2:47 PM

## 2021-03-23 NOTE — Therapy (Signed)
[facility] [address] [place], [place], [address] Phone: [phone]   Fax:  [phone]  Physical Therapy Treatment  Patient Details  Name: Denise [name] MRN: [id number] Date of Birth: [birth year] Referring Provider (PT): Melvenia Beam, MD   Encounter Date: 03/23/2021   PT End of Session - 03/23/21 1328     Visit Number 12    Number of Visits 15    Date for PT Re-Evaluation 31/54/00   per recert 02/07/75   Authorization Type BCBS Medicare    Progress Note Due on Visit 10    PT Start Time 1150    PT Stop Time 1230    PT Time Calculation (min) 40 min    Activity Tolerance Patient tolerated treatment well    Behavior During Therapy [facility] for tasks assessed/performed             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   Subjective Assessment - 03/23/21 1152     Subjective Looked at the handout for community fitness, needs to see what she can do after her daughter's procedure in october. Wants to keep working on her balance.    Pertinent History PD, glaucoma, osteopenia, TB (as a child)    Limitations Walking    Patient Stated Goals wants to know where she is at compared to last time she was here, be more accountable with exercises.    Currently in Pain? No/denies                       Pt performs PWR! Moves in standing position at countertop on blue air ex   PWR! Rock for improved weighshifting x10 reps bilat reaching towards sticky note, then progressed to x10 reps bilat and lifting contralateral leg for dynamic SLS   PWR! Step for improved step initiation x10 reps bilat, performing with improved foot clearance            [facility] Adult PT Treatment/Exercise - 03/23/21 0001       Ambulation/Gait   Ambulation/Gait Yes    Ambulation/Gait Assistance 6: Modified independent (Device/Increase time)    Ambulation/Gait Assistance Details between activities - cued for posture, arm swing and step length    Assistive device None    Gait Pattern Step-through pattern;Decreased arm swing - left;Decreased step length - right;Decreased step length - left;Right foot flat;Left foot flat;Decreased trunk rotation;Trunk flexed;Narrow base of support;Poor foot clearance - left;Poor foot clearance - right    Ambulation Surface Level;Indoor      Neuro Re-ed    Neuro Re-ed Details  PWR! Up Flow:  Seated PWR! Up>sit to stand PWR! Up on blue air ex x5 reps                 Balance Exercises - 03/23/21 1207       Balance Exercises: Standing   Standing Eyes Closed Foam/compliant surface;Narrow base of support (BOS);30 secs;3 reps    Standing Eyes Closed Limitations performed with feet together    Other Standing Exercises standing on level ground with wider BOS: zoom ball for  1-2 minutes for posture/dynamic balance and cues for bigger movement patterns - opening wide and in diagonals, performed an additional 1-2 minutes on blue air ex (PT providing min guard and pt performing with student volunteer)    Other Standing Exercises Comments standing in corner on blue air ex: trunk rotations reaching to targets laterally x8 reps bilat, then superior/laterally x8 reps bilat                  PT Short Term Goals - 03/04/21 1537       PT SHORT TERM GOAL #1   Title = LTGs               PT Long Term Goals - 03/16/21 1118       PT LONG TERM GOAL #1   Title Pt will be independent with final HEP for improved strength, balance, transfers, and gait. ALL LTGS DUE 04/02/21    Time 4    Period Weeks    Status On-going      PT LONG TERM GOAL #2   Title Pt will verbalize understanding of local Parkinson's  disease resources, including options for continue community fitness.    Time 4    Period Weeks    Status On-going      PT LONG TERM GOAL #3   Title Pt will improve TUG cognitive score to less than or equal to 15.5 seconds for decreased fall risk.    Baseline 24.81 seconds; 17.38 seconds on 03/16/21    Time 4    Period Weeks    Status Revised      PT LONG TERM GOAL #4   Title Pt will improve MiniBESTest score to at least 22/28 for decreased fall risk.    Baseline 16/28 at eval on 01/28/21    Time 4    Period Weeks    Status On-going      PT LONG TERM GOAL #5   Title Pt will ambulate at least 1000 ft, indoors/outdoor surfaces independently for improved community gait.    Time 4    Period Weeks    Status On-going      PT LONG TERM GOAL #6   Title 6 MWT to improve to 1175 ft for improved giat efficiency in community gait    Baseline 1100 ft; 1183' on 03/16/21    Time 4    Period Weeks    Status Achieved                   Plan - 03/23/21 1334     Clinical Impression Statement Today's skilled session focused on postural awareness and dynamic balance on unlevel surfaces. Pt able to progress balance with eyes closed today -  able to hold with feet together on blue air ex for 30 seconds (previously was only 5 seconds at eval). Will continue to progress towards LTGs.    Personal Factors and Comorbidities Comorbidity 3+;Time since onset of injury/illness/exacerbation;Other   Recent addition of Sinemet to medications   Comorbidities glaucoma, osteopenia, TB (as a child)    Examination-Activity Limitations Transfers;Locomotion Level;Stand    Examination-Participation Restrictions Church;Community Activity    Stability/Clinical Decision Making Stable/Uncomplicated    Rehab Potential Good    PT Frequency 2x / week    PT Duration 4 weeks   per recert 01/07/8241   PT Treatment/Interventions ADLs/Self Care Home Management;Gait training;Functional mobility training;Stair  training;Therapeutic activities;Therapeutic exercise;Balance training;Neuromuscular re-education;DME Instruction;Patient/family education;Vestibular    PT Next Visit Plan Gait training with  focus on posture,arm swing, step length.  Continue to work on posterior balance recovery, stepping strategies, compliant surfaces for balance. Gait/balance with dual tasking.  Work towards [employer] and answer any additional questions on community fitness.    Consulted and Agree with Plan of Care Patient             Patient will benefit from skilled therapeutic intervention in order to improve the following deficits and impairments:  Abnormal gait, Difficulty walking, Impaired tone, Decreased balance, Impaired flexibility, Decreased mobility, Decreased strength, Postural dysfunction  Visit Diagnosis: Other symptoms and signs involving the nervous system  Unsteadiness on feet  Other abnormalities of gait and mobility  Abnormal posture     Problem List Patient Active Problem List   Diagnosis Date Noted   Parkinson's disease ([place]) 02/17/2020   Gait abnormality 12/04/2019   Prediabetes 12/03/2018   Vitamin D deficiency 12/03/2018   Anemia due to vitamin B12 deficiency 12/03/2018   Fatigue 12/03/2018   Urinary frequency 10/01/2018   Vitamin B12 deficiency 10/01/2018   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 [place], [place], [address] Phone: [phone]   Fax:  [phone]  Name: Jamyria Ozanich MRN: [id number] Date of Birth: 1950-12-30

## 2021-03-25 ENCOUNTER — Ambulatory Visit: Payer: Medicare Other | Admitting: Physical Therapy

## 2021-03-25 ENCOUNTER — Encounter: Payer: Self-pay | Admitting: Physical Therapy

## 2021-03-25 ENCOUNTER — Ambulatory Visit: Payer: Medicare Other | Admitting: Occupational Therapy

## 2021-03-25 ENCOUNTER — Ambulatory Visit: Payer: Medicare Other

## 2021-03-25 ENCOUNTER — Other Ambulatory Visit: Payer: Self-pay

## 2021-03-25 DIAGNOSIS — M6281 Muscle weakness (generalized): Secondary | ICD-10-CM | POA: Diagnosis not present

## 2021-03-25 DIAGNOSIS — R2689 Other abnormalities of gait and mobility: Secondary | ICD-10-CM | POA: Diagnosis not present

## 2021-03-25 DIAGNOSIS — M25612 Stiffness of left shoulder, not elsewhere classified: Secondary | ICD-10-CM | POA: Diagnosis not present

## 2021-03-25 DIAGNOSIS — R278 Other lack of coordination: Secondary | ICD-10-CM | POA: Diagnosis not present

## 2021-03-25 DIAGNOSIS — R293 Abnormal posture: Secondary | ICD-10-CM

## 2021-03-25 DIAGNOSIS — R29818 Other symptoms and signs involving the nervous system: Secondary | ICD-10-CM

## 2021-03-25 DIAGNOSIS — R2681 Unsteadiness on feet: Secondary | ICD-10-CM

## 2021-03-25 DIAGNOSIS — R471 Dysarthria and anarthria: Secondary | ICD-10-CM | POA: Diagnosis not present

## 2021-03-25 DIAGNOSIS — R29898 Other symptoms and signs involving the musculoskeletal system: Secondary | ICD-10-CM | POA: Diagnosis not present

## 2021-03-25 NOTE — Therapy (Signed)
[facility] [address] [place], [place], [address] Phone: [phone]   Fax:  [phone]  Occupational Therapy Treatment  Patient Details  Name: Denise [name] MRN: [id number] Date of Birth: 27-Jun-1951 Referring Provider (OT): Dr. 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   Subjective Assessment - 03/25/21 1105     Subjective  denies pain    Pertinent History Parkinson's Disease (diagnosed 2021).  PMH:  osteopenia, glaucoma    Patient Stated Goals improve posture/"drifting to the left"    Currently in Pain? No/denies                   Treatment: seated neck rotation followed by A-P tilts with focus on upright posture and midline, diagonals standing in corner, min v.c for reach Dynamic step and reach to copy small peg design on vertical surface, with left and right UE's alternately, min v.c for posture, amplitude  and correct design, increased time required. Rotating golf balls in left and right UE's for increased fine motor coordination, min difficulty/ v.c and demonstration.               OT Education - 03/25/21 1238     Education Details Reviewed PWR! moves seated 10 reps each, min v.c for  posture.    Person(s) Educated Patient    Methods Explanation;Demonstration    Comprehension Verbalized understanding;Returned demonstration;Verbal cues required              OT Short Term Goals - 03/09/21 1029       OT SHORT TERM GOAL #1   Title Pt will be independent with updated PD-specific HEP.--check STGs 03/10/2021    Status Achieved      OT SHORT TERM GOAL #2   Title Pt will improve functional reaching/balance as shown by improving standing functional reach to at least 12" with LUE.    Status Achieved      OT SHORT TERM GOAL #3   Title Pt will demo at least 140* shoulder flex for LUE overhead reaching.    Status Achieved      OT SHORT TERM GOAL #4   Title Pt will verbalize understanding of ways to decr risk of future complications related to PD.    Status Achieved               OT Long Term Goals - 03/16/21 1027       OT LONG TERM GOAL #1  Title Pt will verbalize understanding of updated adaptive strategies/AE to incr ease/safety with ADLs/IADLs.--check LTGs 04/09/21    Time 8    Period Weeks    Status On-going      OT LONG TERM GOAL #2   Title Pt will improve LUE functional reaching/coordination as shown by improving box and blocks score by at least 3.    Baseline 48 blocks    Time 8    Period Weeks    Status On-going      OT LONG TERM GOAL #3   Title Pt will demo at least 140* LUE overhead reaching with at least -5* elbow ext.    Baseline 130* with -15* elbow ext    Time 8    Period Weeks    Status Achieved   03/16/21:  140* with -5* elbow ext     OT LONG TERM GOAL #4   Title Pt will verbalize understanding of updated/appropriate community resources.    Time 8    Period Weeks    Status On-going      OT LONG TERM GOAL #5   Title Pt will perform functional activity for at least 74min while maintaining upright posture/avoiding leaning to L.    Time 8    Period Weeks    Status On-going      OT LONG TERM GOAL #6   Title --    Status --       OT LONG TERM GOAL #7   Title --    Status --                   Plan - 03/25/21 1236     Clinical Impression Statement Pt is progressing towards goals . She demonstrates improving upright posture and carryover of larger amplitude movements with functional activity.    OT Occupational Profile and History Detailed Assessment- Review of Records and additional review of physical, cognitive, psychosocial history related to current functional performance    Occupational performance deficits (Please refer to evaluation for details): ADL's;IADL's;Leisure;Social Participation    Body Structure / Function / Physical Skills ADL;Balance;Mobility;Strength;Flexibility;UE functional use;FMC;Gait;Coordination;Decreased knowledge of precautions;GMC;ROM;Decreased knowledge of use of DME;Dexterity;IADL;Improper spinal/pelvic alignment    Rehab Potential Good    Clinical Decision Making Several treatment options, min-mod task modification necessary    Comorbidities Affecting Occupational Performance: May have comorbidities impacting occupational performance    Modification or Assistance to Complete Evaluation  Min-Moderate modification of tasks or assist with assess necessary to complete eval    OT Frequency 2x / week    OT Duration 8 weeks   +eval   OT Treatment/Interventions Self-care/ADL training;Therapeutic exercise;Balance training;Functional Mobility Training;Manual Therapy;Neuromuscular education;Therapeutic activities;DME and/or AE instruction;Patient/family education;Passive range of motion;Moist Heat;Cognitive remediation/compensation;Energy conservation;Aquatic Therapy;Splinting;Ultrasound    Plan start checking goals, anticipate d/c end of next week, will need to schedule f/u eval in 6 mons at d/c    Consulted and Agree with Plan of Care Patient             Patient will benefit from skilled therapeutic intervention in order to improve the following deficits and impairments:   Body  Structure / Function / Physical Skills: ADL, Balance, Mobility, Strength, Flexibility, UE functional use, FMC, Gait, Coordination, Decreased knowledge of precautions, GMC, ROM, Decreased knowledge of use of DME, Dexterity, IADL, Improper spinal/pelvic alignment       Visit Diagnosis: Other symptoms and signs involving the nervous system  Unsteadiness on feet  Abnormal posture  Stiffness of left shoulder, not  elsewhere classified  Other symptoms and signs involving the musculoskeletal system  Other lack of coordination    Problem List Patient Active Problem List   Diagnosis Date Noted   Parkinson's disease ([place]) 02/17/2020   Gait abnormality 12/04/2019   Prediabetes 12/03/2018   Vitamin D deficiency 12/03/2018   Anemia due to vitamin B12 deficiency 12/03/2018   Fatigue 12/03/2018   Urinary frequency 10/01/2018   Vitamin B12 deficiency 10/01/2018   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

## 2021-03-25 NOTE — Therapy (Signed)
[facility] [address] [place], [place], [address] Phone: [phone]   Fax:  [phone]  Physical Therapy Treatment  Patient Details  Name: Denise [name] MRN: [id number] Date of Birth: 1951-03-23 Referring Provider (PT): Melvenia Beam, MD   Encounter Date: 03/25/2021   PT End of Session - 03/25/21 1025     Visit Number 13    Number of Visits 15    Date for PT Re-Evaluation 03/00/92   per recert 09/04/98   Authorization Type BCBS Medicare    Progress Note Due on Visit 10    PT Start Time 1024   pt late and needed to use bathroom at start of session   PT Stop Time 1101    PT Time Calculation (min) 37 min    Activity Tolerance Patient tolerated treatment well    Behavior During Therapy [facility] for tasks assessed/performed             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   Subjective Assessment - 03/25/21 1025     Subjective Has been doing her exercises and walking program at home. No falls.    Pertinent History PD, glaucoma, osteopenia, TB (as a child)    Limitations Walking    Patient Stated Goals wants to know where she is at compared to last time she was here, be more accountable with exercises.    Currently in Pain? No/denies                               [facility] Adult PT Treatment/Exercise - 03/25/21 1036       Ambulation/Gait   Ambulation/Gait Yes    Ambulation/Gait Assistance 5: Supervision    Ambulation/Gait Assistance Details gait outdoors on paved surfaces and indoors: cognitive challenge while naming foods in alphabetical order. noted decr LUE arm swing, intermittent cues throughout for posture, arm  swing,  and opening up hands    Ambulation Distance (Feet) 800 Feet    Assistive device None    Gait Pattern Step-through pattern;Decreased arm swing - left;Decreased step length - right;Decreased step length - left;Right foot flat;Left foot flat;Decreased trunk rotation;Trunk flexed;Narrow base of support;Poor foot clearance - left;Poor foot clearance - right    Ambulation Surface Level;Indoor;Outdoor;Paved      High Level Balance   High Level Balance Activities Negotiating over obstacles    High Level Balance Comments stepping over 4 obstacles over 30' of 4-6" in height, working on tall posture, incr foot clearance (big lift) and maintaining stride length between obstacles, down and back x2 reps                 Balance Exercises - 03/25/21 0001       Balance Exercises: Standing   Rockerboard Anterior/posterior    Rockerboard Limitations stepping on and off board in posterior direction x10 reps bilat, then alternating forward stepping x10 reps bilat, incr difficulty with stepping off forwards. cues for foot clearance and to reset with posture. standing tall on board performing big reciprocal arm swing for  perturbation x12 reps with verbal/visual cues on arm swing and opening up hands when performing    Other Standing Exercises next to counter with UE support: on 6 stepping stones - working on SLS and incr step length with cues to PWR Up once on stone before stepping to next one                PT Education - 03/25/21 1125     Education Details Plan for D/C next week - discussed either PD screens or evals in 6 months for follow up, pt and PT in agreement with return PD evals    Person(s) Educated Patient    Methods Explanation    Comprehension Verbalized understanding              PT Short Term Goals - 03/04/21 1537       PT SHORT TERM GOAL #1   Title = LTGs               PT Long Term Goals - 03/16/21 1118       PT LONG TERM GOAL #1   Title Pt will be  independent with final HEP for improved strength, balance, transfers, and gait. ALL LTGS DUE 04/02/21    Time 4    Period Weeks    Status On-going      PT LONG TERM GOAL #2   Title Pt will verbalize understanding of local Parkinson's disease resources, including options for continue community fitness.    Time 4    Period Weeks    Status On-going      PT LONG TERM GOAL #3   Title Pt will improve TUG cognitive score to less than or equal to 15.5 seconds for decreased fall risk.    Baseline 24.81 seconds; 17.38 seconds on 03/16/21    Time 4    Period Weeks    Status Revised      PT LONG TERM GOAL #4   Title Pt will improve MiniBESTest score to at least 22/28 for decreased fall risk.    Baseline 16/28 at eval on 01/28/21    Time 4    Period Weeks    Status On-going      PT LONG TERM GOAL #5   Title Pt will ambulate at least 1000 ft, indoors/outdoor surfaces independently for improved community gait.    Time 4    Period Weeks    Status On-going      PT LONG TERM GOAL #6   Title 6 MWT to improve to 1175 ft for improved giat efficiency in community gait    Baseline 1100 ft; 1183' on 03/16/21    Time 4    Period Weeks    Status Achieved                   Plan - 03/25/21 1128     Clinical Impression Statement Performed gait with cognitive dual tasking on outdoor surfaces with pt needing cues to reset with posture and noted decr LUE arm swing with cognitive challenge. Remainder of session focused on balance strategies on compliant surfaces and obstacle negotiation with incr foot clearance. Plan to D/C next week with pt in agreement and scheduling return PD evals in 6 months.    Personal Factors and Comorbidities Comorbidity 3+;Time since onset of injury/illness/exacerbation;Other   Recent addition of Sinemet to medications   Comorbidities glaucoma, osteopenia, TB (as a child)    Examination-Activity Limitations Transfers;Locomotion Level;Stand    Examination-Participation  Restrictions Church;Community Activity    Stability/Clinical Decision Making Stable/Uncomplicated    Rehab Potential Good    PT Frequency 2x / week    PT Duration 4 weeks   per recert 09/04/4354   PT Treatment/Interventions ADLs/Self Care Home Management;Gait training;Functional mobility training;Stair training;Therapeutic activities;Therapeutic exercise;Balance training;Neuromuscular re-education;DME Instruction;Patient/family education;Vestibular    PT Next Visit Plan check LTGs and review HEP with D/C this week. schedule pt for return PD evals. Gait/balance with dual tasking.  answer any additional questions on community fitness.    Consulted and Agree with Plan of Care Patient             Patient will benefit from skilled therapeutic intervention in order to improve the following deficits and impairments:  Abnormal gait, Difficulty walking, Impaired tone, Decreased balance, Impaired flexibility, Decreased mobility, Decreased strength, Postural dysfunction  Visit Diagnosis: Other symptoms and signs involving the nervous system  Unsteadiness on feet  Abnormal posture  Other abnormalities of gait and mobility     Problem List Patient Active Problem List   Diagnosis Date Noted   Parkinson's disease ([place]) 02/17/2020   Gait abnormality 12/04/2019   Prediabetes 12/03/2018   Vitamin D deficiency 12/03/2018   Anemia due to vitamin B12 deficiency 12/03/2018   Fatigue 12/03/2018   Urinary frequency 10/01/2018   Vitamin B12 deficiency 10/01/2018   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 [place], [place], [address] Phone: [phone]   Fax:  [phone]  Name: Denise [name] MRN: [id number] Date of Birth:  1950-12-11

## 2021-03-25 NOTE — Therapy (Signed)
[facility] [address] [place], [place], [address] Phone: [phone]   Fax:  [phone]  Speech Language Pathology Treatment  Patient Details  Name: Denise [name] MRN: [id number] Date of Birth: Sep 05, 1950 Referring Provider (SLP): Sarina Ill, MD   Encounter Date: 03/25/2021   End of Session - 03/25/21 1249     Visit Number 12    Number of Visits 25    Date for SLP Re-Evaluation 04/28/21    SLP Start Time 1150    SLP Stop Time  1230    SLP Time Calculation (min) 40 min    Activity Tolerance Patient tolerated treatment well             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   Subjective Assessment - 03/25/21 1156     Subjective Pt reports doing loud /a/ BID most days.    Currently in Pain? No/denies                   ADULT SLP TREATMENT - 03/25/21 1157       General Information   Behavior/Cognition Alert;Cooperative;Pleasant mood      Treatment Provided   Treatment provided Cognitive-Linquistic      Cognitive-Linquistic Treatment   Treatment focused on Dysarthria;Voice    Skilled Treatment SLP used loud /a/ to habitualize conversational volume loudness, and pt averaged low 90s dB. Pt produced everyday sentences averaged low 80s dB. SLP targeted spontaneous multiple (2-4) sentence answers, in which volume was WNL x5 - volume often faded in the 3rd or 4th sentence. SLP reminded pt to think of the effort needed for loud /a/ and use the same effort level for all of her response. After this, pt completed 5 more responses and was WNL loudness for the entirety of the response with rare min A for loudness. Some carryover  to short conversation between tasks today which improved with SLP min cues occasionally.      Assessment / Recommendations / Plan   Plan Continue with current plan of care      Progression Toward Goals   Progression toward goals Progressing toward goals                SLP Short Term Goals - 03/02/21 2349       SLP SHORT TERM GOAL #1   Title pt will produce loud /a/ with low 80s dB average in 3 sessions    Baseline 02/08/21, 02-16-21    Status Achieved    Target Date 02/26/21      SLP SHORT TERM GOAL #2   Title pt will produce sentence responses 80% WNL loudness in 2 sessions    Status Not Met    Target Date 02/26/21      SLP SHORT TERM GOAL #3   Title pt will use abdominal breathing (AB) in sentence responses 80% of the time in 3 sessions    Status Not Met    Target Date 02/26/21      SLP SHORT TERM GOAL #4   Title pt will produce at least 90-second monologue with loudness  average low 70s dB in two sessions    Status Not Met    Target Date 02/26/21              SLP Long Term Goals - 03/25/21 1250       SLP LONG TERM GOAL #1   Title pt will use abdominal breathing (AB) in 5 minutes simple conversation 80% of the time in 3 sessions    Time 7    Period Weeks    Status Not Met    Target Date 03/26/21      SLP LONG TERM GOAL #2   Title pt will use WNL loudness in 5 minutes simple conversation over three sessions    Time 7    Period Weeks    Status Not Met    Target Date 03/26/21      SLP LONG TERM GOAL #3   Title pt will use abdominal breathing (AB) 75% of the time in 10 minutes simple conversation in 3 sessions    Time 11    Period Weeks    Status On-going    Target Date 04/23/21      SLP LONG TERM GOAL #4   Title pt will use WNL loudness in 10 minutes simple conversation over three sessions    Time 11    Period Weeks    Status On-going    Target Date 04/23/21              Plan - 03/25/21 1249     Clinical Impression Statement Denise [name]  continues to present today with suboptimal volume levels in conversation. She is beginning to transfer louder speech into short episodes of spontaneous speech between therapy tasks and short conversational segments. Denise [name] would cont to benefit from skilled ST focusing on improving her speech volume to WNL, and then maintaining consistency of her WNL-volume speech. She may or may not require objective swallow assessment during this therapy course. SLP to cont to monitor.    Speech Therapy Frequency 2x / week    Duration 12 weeks    Treatment/Interventions Aspiration precaution training;Pharyngeal strengthening exercises;Functional tasks;SLP instruction and feedback;Compensatory strategies;Internal/external aids;Cognitive reorganization;Patient/family education;Language facilitation    Potential to Achieve Goals Good    SLP Home Exercise Plan loud /a/ and reading out loud    Consulted and Agree with Plan of Care Patient             Patient will benefit from skilled therapeutic intervention in order to improve the following deficits and impairments:   Dysarthria and anarthria    Problem List Patient Active Problem List   Diagnosis Date Noted   Parkinson's disease ([place]) 02/17/2020   Gait abnormality 12/04/2019   Prediabetes 12/03/2018   Vitamin D deficiency 12/03/2018   Anemia due to vitamin B12 deficiency 12/03/2018   Fatigue 12/03/2018   Urinary frequency 10/01/2018   Vitamin B12 deficiency 10/01/2018   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 [place], [place], [address] Phone: [phone]   Fax:  [phone]   Name: Denise [name] MRN: [id number] Date of Birth: 14-Jun-1951

## 2021-03-30 ENCOUNTER — Ambulatory Visit: Payer: Medicare Other | Admitting: Occupational Therapy

## 2021-03-30 ENCOUNTER — Encounter: Payer: Self-pay | Admitting: Occupational Therapy

## 2021-03-30 ENCOUNTER — Ambulatory Visit: Payer: Medicare Other

## 2021-03-30 ENCOUNTER — Encounter: Payer: Self-pay | Admitting: Physical Therapy

## 2021-03-30 ENCOUNTER — Other Ambulatory Visit: Payer: Self-pay

## 2021-03-30 ENCOUNTER — Ambulatory Visit: Payer: Medicare Other | Admitting: Physical Therapy

## 2021-03-30 DIAGNOSIS — R293 Abnormal posture: Secondary | ICD-10-CM

## 2021-03-30 DIAGNOSIS — R29818 Other symptoms and signs involving the nervous system: Secondary | ICD-10-CM | POA: Diagnosis not present

## 2021-03-30 DIAGNOSIS — R2681 Unsteadiness on feet: Secondary | ICD-10-CM | POA: Diagnosis not present

## 2021-03-30 DIAGNOSIS — R2689 Other abnormalities of gait and mobility: Secondary | ICD-10-CM

## 2021-03-30 DIAGNOSIS — R29898 Other symptoms and signs involving the musculoskeletal system: Secondary | ICD-10-CM

## 2021-03-30 DIAGNOSIS — M6281 Muscle weakness (generalized): Secondary | ICD-10-CM | POA: Diagnosis not present

## 2021-03-30 DIAGNOSIS — R471 Dysarthria and anarthria: Secondary | ICD-10-CM

## 2021-03-30 DIAGNOSIS — M25612 Stiffness of left shoulder, not elsewhere classified: Secondary | ICD-10-CM

## 2021-03-30 DIAGNOSIS — R278 Other lack of coordination: Secondary | ICD-10-CM | POA: Diagnosis not present

## 2021-03-30 NOTE — Therapy (Signed)
[facility] [address] [place], [place], [address] Phone: [phone]   Fax:  [phone]  Speech Language Pathology Treatment  Patient Details  Name: Denise [name] MRN: [id number] Date of Birth: 08-14-1950 Referring Provider (SLP): Sarina Ill, MD   Encounter Date: 03/30/2021   End of Session - 03/30/21 1234     Visit Number 13    Number of Visits 25    Date for SLP Re-Evaluation 04/28/21    SLP Start Time 1150    SLP Stop Time  1230    SLP Time Calculation (min) 40 min    Activity Tolerance Patient tolerated treatment well             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   Subjective Assessment - 03/30/21 1157     Subjective Pt states familyis asking her to repeat less than prior to ST.    Currently in Pain? No/denies                   ADULT SLP TREATMENT - 03/30/21 0001       General Information   Behavior/Cognition Alert;Cooperative;Pleasant mood      Treatment Provided   Treatment provided Cognitive-Linquistic      Cognitive-Linquistic Treatment   Treatment focused on Dysarthria;Voice    Skilled Treatment Denise [name] reports daughter uses nonverbal cues (thumb up) to indicate pt is too soft, however pt is being cued less often overall, to repeat (as in "s"). SLP used loud /a/ to habitualize conversational volume loudness, and pt averaged upper 80s dB - lower 90s dB. Initial cues to phonate /a/ "until you feel like you need to take another breath." Pt produced everyday sentences averaged low 80s dB. SLP targeted short conversation (1-2 minutes) -volume often faded in the 4th of 5th sentence. Pt did better with  nonverbal cues. Pt picked two 30-minute times during the day when she will focus on louder speech 11:30am-12pm and 6:30-7pm      Assessment / Recommendations / Plan   Plan Continue with current plan of care   possible d/c next 1-2 visits due to daughter surgery     Progression Toward Goals   Progression toward goals Progressing toward goals                SLP Short Term Goals - 03/02/21 2349       SLP SHORT TERM GOAL #1   Title pt will produce loud /a/ with low 80s dB average in 3 sessions    Baseline 02/08/21, 02-16-21    Status Achieved    Target Date 02/26/21      SLP SHORT TERM GOAL #2   Title pt will produce sentence responses 80% WNL loudness in 2 sessions    Status Not Met    Target Date 02/26/21      SLP SHORT TERM GOAL #3   Title pt will use abdominal breathing (AB) in sentence responses 80% of the time in 3 sessions    Status Not Met    Target Date 02/26/21      SLP SHORT  TERM GOAL #4   Title pt will produce at least 90-second monologue with loudness average low 70s dB in two sessions    Status Not Met    Target Date 02/26/21              SLP Long Term Goals - 03/30/21 1329       SLP LONG TERM GOAL #1   Title pt will use abdominal breathing (AB) in 5 minutes simple conversation 80% of the time in 3 sessions    Time 7    Period Weeks    Status Not Met      SLP LONG TERM GOAL #2   Title pt will use WNL loudness in 5 minutes simple conversation over three sessions    Time 7    Period Weeks    Status Not Met      SLP LONG TERM GOAL #3   Title pt will use abdominal breathing (AB) 75% of the time in 10 minutes simple conversation in 3 sessions    Time 11    Period Weeks    Status On-going    Target Date 04/23/21      SLP LONG TERM GOAL #4   Title pt will use WNL loudness in 10 minutes simple conversation over three sessions    Time 11    Period Weeks    Status On-going    Target Date 04/23/21              Plan - 03/30/21 1329      Clinical Impression Statement Denise [name] continues to present today with suboptimal volume levels in conversation. She is beginning to transfer louder speech into short episodes of spontaneous speech between therapy tasks and short conversational segments. Denise [name] would cont to benefit from skilled ST focusing on improving her speech volume to WNL, and then maintaining consistency of her WNL-volume speech. She may or may not require objective swallow assessment during this therapy course. SLP to cont to monitor. She may need to d/c in next 1-2 visits due to her daughter having surgery.    Speech Therapy Frequency 2x / week    Duration 12 weeks    Treatment/Interventions Aspiration precaution training;Pharyngeal strengthening exercises;Functional tasks;SLP instruction and feedback;Compensatory strategies;Internal/external aids;Cognitive reorganization;Patient/family education;Language facilitation    Potential to Achieve Goals Good    SLP Home Exercise Plan loud /a/ and reading out loud    Consulted and Agree with Plan of Care Patient             Patient will benefit from skilled therapeutic intervention in order to improve the following deficits and impairments:   No diagnosis found.    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 [place], [place], [address] Phone: [phone]   Fax:   [phone]   Name: Denise [name] MRN: [id number] Date of Birth: 09-26-1950

## 2021-03-30 NOTE — Therapy (Signed)
[facility] [address] [place], [place], [address] Phone: [phone]   Fax:  [phone]  Occupational Therapy Treatment  Patient Details  Name: Denise [name] MRN: [id number] Date of Birth: [birth year] Referring Provider (OT): Dr. Sarina Ill   Encounter Date: 03/30/2021   OT End of Session - 03/30/21 1105     Visit Number 14    Number of Visits 17    Date for OT Re-Evaluation 04/09/21    Authorization Type BCBS Medicare:  no auth, no visit limit    Authorization - Visit Number 14    Authorization - Number of Visits 20    Progress Note Due on Visit 20    OT Start Time 1103    OT Stop Time 1145    OT Time Calculation (min) 42 min    Activity Tolerance Patient tolerated treatment well    Behavior During Therapy [facility] for tasks assessed/performed             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   Subjective Assessment - 03/30/21 1104     Subjective  denies pain    Pertinent History Parkinson's Disease (diagnosed 2021).  PMH:  osteopenia, glaucoma    Patient Stated Goals improve posture/"drifting to the left"    Currently in Pain? No/denies              Sitting PWR! Up with hand flicks x 10 followed by PWR! Hands x10 with occasional min v.c. for incr amplitude/posture.  Sitting, bilateral shoulder flex with pole and diagonals to each side with min cueing initially for midline alignment.  Standing, functional reaching laterally, across body, and overhead with each UE incorporating wt. Shifts in various directions and trunk rotation with min cueing for posture (feet apart, chest up).   Functional tasks for coordination  with occasional intermittent min cueing for posture (significant improvement overall): - Fastening/fastening buttons for bilateral hand coordination with min difficulty - Placing grooved pegs in pegboard for in-hand manipulation/coordination with use of PWR! Hands prior to grasp with each hand. - Flipping cards with both hands simultaneously for bilateral hand coordination/timing and focus on large amplitude movements - Dealing cards with each thumb with min cueing for large amplitude movements. - Scooping beans with spoon to place in container for simulated eating with good posture    Reassessed LUE box and blocks--see below.        OT Short Term Goals - 03/09/21 1029       OT SHORT TERM GOAL #1   Title Pt will be independent with updated PD-specific HEP.--check STGs 03/10/2021    Status Achieved      OT SHORT TERM GOAL #2   Title Pt will improve functional reaching/balance as shown by improving standing functional reach to at least 12" with LUE.    Status Achieved      OT SHORT TERM GOAL #3   Title Pt will demo at least 140* shoulder flex for LUE overhead reaching.    Status Achieved      OT SHORT TERM GOAL #4   Title Pt will  verbalize understanding of ways to decr risk of future complications related to PD.    Status Achieved               OT Long Term Goals - 03/30/21 1106       OT LONG TERM GOAL #1   Title Pt will verbalize understanding of updated adaptive strategies/AE to incr ease/safety with ADLs/IADLs.--check LTGs 04/09/21    Time 8    Period Weeks    Status On-going      OT LONG TERM GOAL #2   Title Pt will improve LUE functional reaching/coordination as shown by improving box and blocks score by at least 3.    Baseline 48 blocks    Time 8    Period Weeks    Status Not Met   03/30/21:  47 blocks (but decr drops and larger amplitude movements noted)     OT LONG TERM GOAL #3   Title Pt will demo at least 140* LUE overhead reaching with at least -5* elbow  ext.    Baseline 130* with -15* elbow ext    Time 8    Period Weeks    Status Achieved   03/16/21:  140* with -5* elbow ext     OT LONG TERM GOAL #4   Title Pt will verbalize understanding of updated/appropriate community resources.    Time 8    Period Weeks    Status Achieved   03/30/21:  Met (issued by PT)     OT LONG TERM GOAL #5   Title Pt will perform functional activity for at least 51mn while maintaining upright posture/avoiding leaning to L.    Time 8    Period Weeks    Status On-going      OT LONG TERM GOAL #6   Title --      OT LONG TERM GOAL #7   Title --                   Plan - 03/30/21 1105     Clinical Impression Statement Pt is progressing towards goals . She demonstrates improving upright posture and carryover of larger amplitude movements with functional activity.    OT Occupational Profile and History Detailed Assessment- Review of Records and additional review of physical, cognitive, psychosocial history related to current functional performance    Occupational performance deficits (Please refer to evaluation for details): ADL's;IADL's;Leisure;Social Participation    Body Structure / Function / Physical Skills ADL;Balance;Mobility;Strength;Flexibility;UE functional use;FMC;Gait;Coordination;Decreased knowledge of precautions;GMC;ROM;Decreased knowledge of use of DME;Dexterity;IADL;Improper spinal/pelvic alignment    Rehab Potential Good    Clinical Decision Making Several treatment options, min-mod task modification necessary    Comorbidities Affecting Occupational Performance: May have comorbidities impacting occupational performance    Modification or Assistance to Complete Evaluation  Min-Moderate modification of tasks or assist with assess necessary to complete eval    OT Frequency 2x / week    OT Duration 8 weeks   +eval   OT Treatment/Interventions Self-care/ADL training;Therapeutic exercise;Balance training;Functional Mobility Training;Manual  Therapy;Neuromuscular education;Therapeutic activities;DME and/or AE instruction;Patient/family education;Passive range of motion;Moist Heat;Cognitive remediation/compensation;Energy conservation;Aquatic Therapy;Splinting;Ultrasound    Plan finish checking goals,  anticipate d/c next session, will need to schedule f/u eval in 6 monthss at d/c    Consulted and Agree with Plan of Care Patient             Patient will benefit from skilled therapeutic intervention in order to improve the following deficits and impairments:   Body Structure /  Function / Physical Skills: ADL, Balance, Mobility, Strength, Flexibility, UE functional use, FMC, Gait, Coordination, Decreased knowledge of precautions, GMC, ROM, Decreased knowledge of use of DME, Dexterity, IADL, Improper spinal/pelvic alignment       Visit Diagnosis: Other symptoms and signs involving the nervous system  Unsteadiness on feet  Abnormal posture  Stiffness of left shoulder, not elsewhere classified  Other symptoms and signs involving the musculoskeletal system  Other abnormalities of gait and mobility    Problem List Patient Active Problem List   Diagnosis Date Noted   Parkinson's disease ([place]) 02/17/2020   Gait abnormality 12/04/2019   Prediabetes 12/03/2018   Vitamin D deficiency 12/03/2018   Anemia due to vitamin B12 deficiency 12/03/2018   Fatigue 12/03/2018   Urinary frequency 10/01/2018   Vitamin B12 deficiency 10/01/2018   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 [place], [place], [address] Phone: [phone]   Fax:  [phone]  Name: Denise [name] MRN: [id number] Date of Birth: 1951-01-11  Vianne Bulls, OTR/L [facility] [address] [place], [place]  [address] [phone] phone [phone] 03/30/21 12:05 PM

## 2021-03-30 NOTE — Therapy (Signed)
[facility] [address] [place], [place], [address] Phone: [phone]   Fax:  [phone]  Physical Therapy Treatment  Patient Details  Name: Denise [name] MRN: [id number] Date of Birth: Jan 07, 1951 Referring Provider (PT): Melvenia Beam, MD   Encounter Date: 03/30/2021   PT End of Session - 03/30/21 1102     Visit Number 14    Number of Visits 15    Date for PT Re-Evaluation 67/34/19   per recert 09/07/88   Authorization Type BCBS Medicare    PT Start Time 1024   in the restroom   PT Stop Time 1102    PT Time Calculation (min) 38 min    Activity Tolerance Patient tolerated treatment well    Behavior During Therapy [facility] for tasks assessed/performed             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   Subjective Assessment - 03/30/21 1024     Subjective Nothing new.  Finishing up with therapy this week.    Pertinent History PD, glaucoma, osteopenia, TB (as a child)    Limitations Walking    Patient Stated Goals wants to know where she is at compared to last time she was here, be more accountable with exercises.    Currently in Pain? Yes    Pain Score 3     Pain Location Hip    Pain Orientation Right    Pain Descriptors / Indicators Aching    Pain Type Acute pain    Pain Onset In the past 7 days    Pain Frequency Intermittent    Aggravating Factors  unsure, late taking medication    Pain Relieving Factors taking medications                               [facility] Adult PT Treatment/Exercise - 03/30/21 0001       Transfers   Transfers Sit to Stand;Stand to Sit    Sit to Stand 6: Modified independent  (Device/Increase time);Without upper extremity assist;From bed;From chair/3-in-1    Five time sit to stand comments  15.6 sec    Stand to Sit 6: Modified independent (Device/Increase time);Without upper extremity assist;To bed;To chair/3-in-1      Ambulation/Gait   Ambulation/Gait Yes    Ambulation/Gait Assistance 6: Modified independent (Device/Increase time)    Ambulation/Gait Assistance Details Gait with conversation tasks outdoors, no LOB; gait with naming tasks (cities [place] indoors), with decreased foot clearance, no LOB.    Ambulation Distance (Feet) 600 Feet   1000   Assistive device None    Gait Pattern Step-through pattern;Decreased arm swing - left;Decreased step length - right;Decreased step length - left;Right foot flat;Left foot flat;Decreased trunk rotation;Trunk flexed;Narrow base of support;Poor foot clearance - left;Poor foot clearance - right    Ambulation Surface Level;Indoor    Gait velocity 10.69 sec=3.07 ft/sec    Gait Comments Pt reports strategies of counting steps for longer strides and using purse on R side to encourage L arm swing.  Knee/Hip Exercises: Aerobic   Stepper Seated SciFit, Stepper, Level 2.5, 4 extremities, x 6 minutes, keeping speed >80 steps/minute for intensity of aerobic activity.    Other Aerobic Discussed looking for seated machines-stepper or recumbent bike at [facility] and replacing therapy time during the week with time at [facility] for aerobic activity.  Discussed intensity rating (to work at 6-8/10) for aerobic activity.                       PT Short Term Goals - 03/04/21 1537       PT SHORT TERM GOAL #1   Title = LTGs               PT Long Term Goals - 03/30/21 1030       PT LONG TERM GOAL #1   Title Pt will be independent with final HEP for improved strength, balance, transfers, and gait. ALL LTGS DUE 04/02/21    Time 4    Period Weeks    Status On-going      PT LONG TERM GOAL #2   Title Pt will verbalize  understanding of local Parkinson's disease resources, including options for continue community fitness.    Time 4    Period Weeks    Status Achieved      PT LONG TERM GOAL #3   Title Pt will improve TUG cognitive score to less than or equal to 15.5 seconds for decreased fall risk.    Baseline 24.81 seconds; 17.38 seconds on 03/16/21    Time 4    Period Weeks    Status Revised      PT LONG TERM GOAL #4   Title Pt will improve MiniBESTest score to at least 22/28 for decreased fall risk.    Baseline 16/28 at eval on 01/28/21    Time 4    Period Weeks    Status On-going      PT LONG TERM GOAL #5   Title Pt will ambulate at least 1000 ft, indoors/outdoor surfaces independently for improved community gait.    Time 4    Period Weeks    Status Achieved      PT LONG TERM GOAL #6   Title 6 MWT to improve to 1175 ft for improved giat efficiency in community gait    Baseline 1100 ft; 1183' on 03/16/21    Time 4    Period Weeks    Status Achieved                   Plan - 03/30/21 1059     Clinical Impression Statement Began assessing LTGs, with pt meeting LTG 2 and LTG 5 (for outdoor gait).  Educated on importance of aerobic activity incorporated into her continued fitness routine, and she plans to go to local [employer] when things settle down with daughter's medical appts.  She is on track towards remaining LTGs.    Personal Factors and Comorbidities Comorbidity 3+;Time since onset of injury/illness/exacerbation;Other   Recent addition of Sinemet to medications   Comorbidities glaucoma, osteopenia, TB (as a child)    Examination-Activity Limitations Transfers;Locomotion Level;Stand    Examination-Participation Restrictions Church;Community Activity    Stability/Clinical Decision Making Stable/Uncomplicated    Rehab Potential Good    PT Frequency 2x / week    PT Duration 4 weeks   per recert 10/08/6544   PT Treatment/Interventions ADLs/Self Care Home Management;Gait training;Functional  mobility training;Stair training;Therapeutic activities;Therapeutic exercise;Balance training;Neuromuscular re-education;DME Instruction;Patient/family education;Vestibular  PT Next Visit Plan check LTGs and review HEP with D/C this week. schedule pt for return PD evals.    Consulted and Agree with Plan of Care Patient             Patient will benefit from skilled therapeutic intervention in order to improve the following deficits and impairments:  Abnormal gait, Difficulty walking, Impaired tone, Decreased balance, Impaired flexibility, Decreased mobility, Decreased strength, Postural dysfunction  Visit Diagnosis: Other abnormalities of gait and mobility  Unsteadiness on feet     Problem List Patient Active Problem List   Diagnosis Date Noted   Parkinson's disease ([place]) 02/17/2020   Gait abnormality 12/04/2019   Prediabetes 12/03/2018   Vitamin D deficiency 12/03/2018   Anemia due to vitamin B12 deficiency 12/03/2018   Fatigue 12/03/2018   Urinary frequency 10/01/2018   Vitamin B12 deficiency 10/01/2018   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 [place], [place], [address] Phone: [phone]   Fax:  [phone]  Name: Chakia Counts MRN: [id number] Date of Birth: Mar 01, 1951

## 2021-04-01 ENCOUNTER — Ambulatory Visit: Payer: Medicare Other | Admitting: Physical Therapy

## 2021-04-01 ENCOUNTER — Other Ambulatory Visit: Payer: Self-pay

## 2021-04-01 ENCOUNTER — Ambulatory Visit: Payer: Medicare Other

## 2021-04-01 ENCOUNTER — Encounter: Payer: Self-pay | Admitting: Physical Therapy

## 2021-04-01 ENCOUNTER — Ambulatory Visit: Payer: Medicare Other | Admitting: Occupational Therapy

## 2021-04-01 DIAGNOSIS — M6281 Muscle weakness (generalized): Secondary | ICD-10-CM

## 2021-04-01 DIAGNOSIS — R278 Other lack of coordination: Secondary | ICD-10-CM

## 2021-04-01 DIAGNOSIS — R2681 Unsteadiness on feet: Secondary | ICD-10-CM | POA: Diagnosis not present

## 2021-04-01 DIAGNOSIS — R29898 Other symptoms and signs involving the musculoskeletal system: Secondary | ICD-10-CM | POA: Diagnosis not present

## 2021-04-01 DIAGNOSIS — R2689 Other abnormalities of gait and mobility: Secondary | ICD-10-CM | POA: Diagnosis not present

## 2021-04-01 DIAGNOSIS — R29818 Other symptoms and signs involving the nervous system: Secondary | ICD-10-CM

## 2021-04-01 DIAGNOSIS — R293 Abnormal posture: Secondary | ICD-10-CM | POA: Diagnosis not present

## 2021-04-01 DIAGNOSIS — R471 Dysarthria and anarthria: Secondary | ICD-10-CM | POA: Diagnosis not present

## 2021-04-01 DIAGNOSIS — M25612 Stiffness of left shoulder, not elsewhere classified: Secondary | ICD-10-CM

## 2021-04-01 NOTE — Therapy (Signed)
[facility] [address] [place], [place], [address] Phone: [phone]   Fax:  [phone]  Occupational Therapy Treatment  Patient Details  Name: Denise [name] MRN: [id number] Date of Birth: 1950-10-12 Referring Provider (OT): Dr. 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     Treatment: PWR! Moves basic 4 in seated, min v.c for posture, pt returned demonstration Dynamic step and reach activities to retrieve items from overhead shelf for right and left UE's, min v.c for amplitude Then tossing scarves to targets, min v.c Ambulating while tossing scarf between hands, min difficulty v.c  Placing grooved pegs into pegboard with right and left UE's, min v.c fo upright posture, removing with in hand manipulation.                        OT Short Term Goals - 04/01/21 1149       OT SHORT TERM GOAL #1   Title Pt will be independent with updated PD-specific HEP.--check STGs 03/10/2021    Status Achieved      OT SHORT TERM GOAL #2   Title Pt will improve functional reaching/balance as shown by improving standing functional reach to at least 12" with LUE.    Status Achieved      OT SHORT TERM GOAL #3   Title Pt will demo at least 140*  shoulder flex for LUE overhead reaching.    Status Achieved      OT SHORT TERM GOAL #4   Title Pt will verbalize understanding of ways to decr risk of future complications related to PD.    Status Achieved               OT Long Term Goals - 04/01/21 1149       OT LONG TERM GOAL #1   Title Pt will verbalize understanding of updated adaptive strategies/AE to incr ease/safety with ADLs/IADLs.--check LTGs 04/09/21    Time 8    Period Weeks    Status Achieved      OT LONG TERM GOAL #2   Title Pt will improve LUE functional reaching/coordination as shown by improving box and blocks score by at least 3.    Baseline 48 blocks    Time 8    Period Weeks    Status Not Met   03/30/21:  47 blocks (but decr drops and larger amplitude movements noted)     OT LONG TERM GOAL #3   Title Pt will demo at  least 140* LUE overhead reaching with at least -5* elbow ext.    Baseline 130* with -15* elbow ext    Time 8    Period Weeks    Status Achieved   03/16/21:  140* with -5* elbow ext     OT LONG TERM GOAL #4   Title Pt will verbalize understanding of updated/appropriate community resources.    Time 8    Period Weeks    Status Achieved   03/30/21:  Met (issued by PT)     OT LONG TERM GOAL #5   Title Pt will perform functional activity for at least 56mn while maintaining upright posture/avoiding leaning to L.    Time 8    Period Weeks    Status Partially Met   improved but not consistent all the time     OT LONG TERM GOAL #6   Title --      OT LONG TERM GOAL #7   Title --                   Plan - 04/01/21 1223     Clinical Impression Statement Pt has made excellent overall progress. She demonstrates improved posture and carryover of large  movements with functional activity.She agrees with plans for discharge.    OT Occupational Profile and History Detailed Assessment- Review of Records and additional review of physical, cognitive, psychosocial history related to current  functional performance    Occupational performance deficits (Please refer to evaluation for details): ADL's;IADL's;Leisure;Social Participation    Body Structure / Function / Physical Skills ADL;Balance;Mobility;Strength;Flexibility;UE functional use;FMC;Gait;Coordination;Decreased knowledge of precautions;GMC;ROM;Decreased knowledge of use of DME;Dexterity;IADL;Improper spinal/pelvic alignment    Rehab Potential Good    Clinical Decision Making Several treatment options, min-mod task modification necessary    Comorbidities Affecting Occupational Performance: May have comorbidities impacting occupational performance    Modification or Assistance to Complete Evaluation  Min-Moderate modification of tasks or assist with assess necessary to complete eval    OT Frequency 2x / week    OT Duration 8 weeks   +eval   OT Treatment/Interventions Self-care/ADL training;Therapeutic exercise;Balance training;Functional Mobility Training;Manual Therapy;Neuromuscular education;Therapeutic activities;DME and/or AE instruction;Patient/family education;Passive range of motion;Moist Heat;Cognitive remediation/compensation;Energy conservation;Aquatic Therapy;Splinting;Ultrasound    Plan d/c OT, f/u evaluation in 6 mons    Consulted and Agree with Plan of Care Patient             Patient will benefit from skilled therapeutic intervention in order to improve the following deficits and impairments:   Body Structure / Function / Physical Skills: ADL, Balance, Mobility, Strength, Flexibility, UE functional use, FMC, Gait, Coordination, Decreased knowledge of precautions, GMC, ROM, Decreased knowledge of use of DME, Dexterity, IADL, Improper spinal/pelvic alignment       Visit Diagnosis: Other symptoms and signs involving the nervous system  Unsteadiness on feet  Abnormal posture  Stiffness of left shoulder, not elsewhere classified  Other symptoms and signs involving the musculoskeletal system  Muscle  weakness (generalized)  Other lack of coordination  OCCUPATIONAL THERAPY DISCHARGE SUMMARY   Current functional level related to goals / functional outcomes: Pt made good overall progress. She achieved all short term goals and 3/5 long term goals. Pt demonstrates improved upright posture, however she still requires v.c at times to maintain upright posture and avoiding lateral lean.   Remaining deficits: Bradykinesia, abnormal posture, decreased balance, decreased coordination    Education / Equipment: Pt was educated regarding adapted strategies for ADLs, community resources, HEP, and ways to  prevent future PD related complications. Pt verbalizes understanding of all education.   Patient agrees to discharge. Patient goals were partially met. Patient is being discharged due to being pleased with the current functional level..     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 [place], [place], [address] Phone: [phone]   Fax:  [phone]  Name: Denise [name] MRN: [id number] Date of Birth: 21-Oct-1950

## 2021-04-01 NOTE — Patient Instructions (Signed)
In patient's exercise folder:  Corner balance:  -Standing on pillow/towel feet apart:  EO head turns/head nods x 5; EC head turns/head nods x 5 -standing on pillow/towel feet together:  EO head turns/head nods x 5; EC head turns/head nods x 5  Access Code: [email or web address] URL: [email or web address] Date: 04/09/2020 Prepared by: Mady Haagensen   Exercises Alternating Step Backward with Support - 1 x daily - 5 x weekly - 1-2 sets - 10 reps   10 reps each

## 2021-04-01 NOTE — Therapy (Signed)
[facility] [address] [place], [place], [address] Phone: [phone]   Fax:  [phone]  Physical Therapy Treatment/Discharge Summary  Patient Details  Name: Denise [name] MRN: [id number] Date of Birth: 11-16-1950 Referring Provider (PT): Melvenia Beam, MD   Encounter Date: 04/01/2021   PT End of Session - 04/01/21 1025     Visit Number 15    Number of Visits 15    Date for PT Re-Evaluation 30/09/23   per recert 3/0/07   Authorization Type BCBS Medicare    PT Start Time 1024   pt in restroom at start of session   PT Stop Time 1102    PT Time Calculation (min) 38 min    Activity Tolerance Patient tolerated treatment well    Behavior During Therapy [facility] for tasks assessed/performed             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       [facility] PT Assessment - 04/01/21 1027       Mini-BESTest   Sit To Stand Normal: Comes to stand without use of hands and stabilizes independently.    Rise to Toes Moderate: Heels up, but not full range (smaller than when holding hands), OR noticeable instability for 3 s.    Stand on one leg (left) Moderate: < 20 s   1.2, 5.09   Stand on one leg (right) Moderate: < 20 s   6, 8.1   Stand on one leg - lowest score 1    Compensatory Stepping Correction - Forward Normal: Recovers independently with a single, large step (second realignement is allowed).    Compensatory Stepping Correction - Backward Normal: Recovers independently with a single, large step    Compensatory Stepping Correction - Left Lateral Moderate: Several steps to recover equilibrium    Compensatory Stepping Correction - Right  Lateral Moderate: Several steps to recover equilibrium    Stepping Corredtion Lateral - lowest score 1    Stance - Feet together, eyes open, firm surface  Normal: 30s    Stance - Feet together, eyes closed, foam surface  Normal: 30s    Incline - Eyes Closed Normal: Stands independently 30s and aligns with gravity    Change in Gait Speed Moderate: Unable to change walking speed or signs of imbalance    Walk with head turns - Horizontal Normal: performs head turns with no change in gait speed and good balance    Walk with pivot turns Moderate:Turns with feet close SLOW (>4 steps) with good balance.    Step over obstacles Normal: Able to step over box with minimal change of gait speed and with good balance.    Timed UP & GO with Dual Task Moderate: Dual Task affects either counting OR walking (>10%) when compared to the TUG without Dual Task.    Mini-BEST total score 22      Timed Up and Go Test   Normal TUG (seconds) 11.35    Cognitive TUG (seconds) 13.84                Pt performs  PWR! Moves in  standing position:    PWR! Up for improved posture x10 reps, initial cues for elbow extension and scapular retraction   PWR! Rock for improved weighshifting x10 reps bilat   PWR! Twist for improved trunk rotation x10 reps bilat, cues to stop and reset in the middle   PWR! Step for improved step initiation x10 reps bilat              [facility] Adult PT Treatment/Exercise - 04/01/21 1754       Therapeutic Activites    Therapeutic Activities Other Therapeutic Activities    Other Therapeutic Activities Reviewed optimal PD exercise program after D/C, with pt verbalizing understanding and will look into more options including the [employer] and community fitness after pt's daughter's medical appts/surgery. Printed out pt updated PD resources handout for October. emailed pt a Youtube link for PWR moves in standing for review for technique as needed               PHYSICAL THERAPY  DISCHARGE SUMMARY  Visits from Start of Care: 15  Current functional level related to goals / functional outcomes: See LTGs.   Remaining deficits: Balance impairments, gait abnormalities, postural abnormalities.   Education / Equipment: HEP    Patient agrees to discharge. Patient goals were met. Patient is being discharged due to meeting the stated rehab goals.         PT Short Term Goals - 03/04/21 1537       PT SHORT TERM GOAL #1   Title = LTGs               PT Long Term Goals - 04/01/21 1044       PT LONG TERM GOAL #1   Title Pt will be independent with final HEP for improved strength, balance, transfers, and gait. ALL LTGS DUE 04/02/21    Time 4    Period Weeks    Status On-going      PT LONG TERM GOAL #2   Title Pt will verbalize understanding of local Parkinson's disease resources, including options for continue community fitness.    Time 4    Period Weeks    Status Achieved      PT LONG TERM GOAL #3   Title Pt will improve TUG cognitive score to less than or equal to 15.5 seconds for decreased fall risk.    Baseline 24.81 seconds; 17.38 seconds on 03/16/21; 13.84 seconds on 04/01/21    Time 4    Period Weeks    Status Achieved      PT LONG TERM GOAL #4   Title Pt will improve MiniBESTest score to at least 22/28 for decreased fall risk.    Baseline 16/28 at eval on 01/28/21; 22/28 on 04/01/21    Time 4    Period Weeks    Status Achieved      PT LONG TERM GOAL #5   Title Pt will ambulate at least 1000 ft, indoors/outdoor surfaces independently for improved community gait.    Time 4    Period Weeks    Status Achieved      PT LONG TERM GOAL #6   Title 6 MWT to improve to 1175 ft for improved giat efficiency in community gait    Baseline 1100 ft; 1183' on 03/16/21    Time 4    Period Weeks    Status Achieved  Plan - 04/01/21 1803     Clinical Impression Statement Checked remainder of LTGs with pt meeting LTG in  regards to miniBEST - scored a 22/28 today (previously 16/28). Pt improved cog TUG to 13.84 seconds today, indicating a decr fall risk and improved dual tasking. Pt has made excellent progress and will be discharged at this time. Pt in agreement with plan and pt understands importance of continuing with exercise program. Will schedule return PD evals in 6 months.    Personal Factors and Comorbidities Comorbidity 3+;Time since onset of injury/illness/exacerbation;Other   Recent addition of Sinemet to medications   Comorbidities glaucoma, osteopenia, TB (as a child)    Examination-Activity Limitations Transfers;Locomotion Level;Stand    Examination-Participation Restrictions [employer];Community Activity    Stability/Clinical Decision Making Stable/Uncomplicated    Rehab Potential Good    PT Frequency 2x / week    PT Duration 4 weeks   per recert 0/08/7739   PT Treatment/Interventions ADLs/Self Care Home Management;Gait training;Functional mobility training;Stair training;Therapeutic activities;Therapeutic exercise;Balance training;Neuromuscular re-education;DME Instruction;Patient/family education;Vestibular    PT Next Visit Plan D/C    Consulted and Agree with Plan of Care Patient             Patient will benefit from skilled therapeutic intervention in order to improve the following deficits and impairments:  Abnormal gait, Difficulty walking, Impaired tone, Decreased balance, Impaired flexibility, Decreased mobility, Decreased strength, Postural dysfunction  Visit Diagnosis: Other symptoms and signs involving the nervous system  Unsteadiness on feet  Abnormal posture     Problem List Patient Active Problem List   Diagnosis Date Noted   Parkinson's disease ([place]) 02/17/2020   Gait abnormality 12/04/2019   Prediabetes 12/03/2018   Vitamin D deficiency 12/03/2018   Anemia due to vitamin B12 deficiency 12/03/2018   Fatigue 12/03/2018   Urinary frequency 10/01/2018   Vitamin B12  deficiency 10/01/2018   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

## 2021-04-01 NOTE — Therapy (Signed)
[facility] [address] [place], [place], [address] Phone: [phone]   Fax:  [phone]  Speech Language Pathology Treatment/ Discharge Summary  Patient Details  Name: Denise [name] MRN: [id number] Date of Birth: April 29, 1951 Referring Provider (SLP): Sarina Ill, MD   Encounter Date: 04/01/2021   End of Session - 04/01/21 1449     Visit Number 14    Number of Visits 25    Date for SLP Re-Evaluation 04/28/21    SLP Start Time 1105    SLP Stop Time  1145    SLP Time Calculation (min) 40 min    Activity Tolerance Patient tolerated treatment well             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   SPEECH THERAPY DISCHARGE SUMMARY  Visits from Start of Care: 14  Current functional level related to goals / functional outcomes: See goals below.   Remaining deficits: Dysarthria, some cognitive communication deficits.   Education / Equipment: Loud /a/, everyday sentences, need to do HEP BID   Patient agrees to discharge. Patient goals were partially met. Patient is being discharged due to the patient's request..(daughter undergoing medical procedure next week)      Subjective Assessment - 04/01/21 1446     Subjective Pt states family cont asking her to repeat less than prior to ST.    Currently in Pain? No/denies                   ADULT SLP TREATMENT - 04/01/21 1446       General Information   Behavior/Cognition Alert;Cooperative;Pleasant mood      Treatment Provided   Treatment provided Cognitive-Linquistic      Cognitive-Linquistic Treatment   Treatment focused on Dysarthria;Voice    Skilled Treatment  SLP used loud /a/ to habitualize conversational volume loudness, and pt averaged upper 80s dB - lower 90s dB. Pt produced everyday sentences averaged low-mid 80s dB. SLP targeted short conversation (2-3 minutes) -volume often faded after 2 minutes. Pt improved her performance with nonverbal cues. She demonstrated understanding of need to cont loud /a/ and sentences, as well as 20 minutes practice with louder speech each day.      Assessment / Recommendations / Plan   Plan Discharge SLP treatment due to (comment)   pt's request - dtr with medical procedure next week     Progression Toward Goals   Progression toward goals --   see goals - d/c day             SLP Education - 04/01/21 1624     Education Details what she needs to cont to do after d/c    Person(s) Educated Patient    Methods Explanation    Comprehension Verbalized understanding              SLP Short Term Goals - 03/02/21 2349       SLP SHORT TERM GOAL #1   Title pt will produce loud /a/ with low 80s dB average in 3 sessions    Baseline 02/08/21, 02-16-21  Status Achieved    Target Date 02/26/21      SLP SHORT TERM GOAL #2   Title pt will produce sentence responses 80% WNL loudness in 2 sessions    Status Not Met    Target Date 02/26/21      SLP SHORT TERM GOAL #3   Title pt will use abdominal breathing (AB) in sentence responses 80% of the time in 3 sessions    Status Not Met    Target Date 02/26/21      SLP SHORT TERM GOAL #4   Title pt will produce at least 90-second monologue with loudness average low 70s dB in two sessions    Status Not Met    Target Date 02/26/21              SLP Long Term Goals - 04/01/21 1510       SLP LONG TERM GOAL #1   Title pt will use abdominal breathing (AB) in 5 minutes simple conversation 80% of the time in 3 sessions    Time 7    Period Weeks    Status Not Met      SLP LONG TERM GOAL #2   Title pt will use WNL loudness in 5 minutes simple conversation over  three sessions    Time 7    Period Weeks    Status Not Met      SLP LONG TERM GOAL #3   Title pt will use abdominal breathing (AB) 75% of the time in 10 minutes simple conversation in 3 sessions    Baseline 04-01-21    Time 11    Period Weeks    Status Partially Met      SLP LONG TERM GOAL #4   Title pt will use WNL loudness in 10 minutes simple conversation over three sessions    Time 11    Period Weeks    Status Not Met              Plan - 04/01/21 1452     Clinical Impression Statement Denise [name] continues to present today with suboptimal volume levels in conversation, unless asked to focus on conversation by SLP. Denise [name] has requested d/c today due to daughter undergoing medical procedure. She will be seen again in approx 6 months.    Speech Therapy Frequency --    Duration --    Treatment/Interventions Aspiration precaution training;Pharyngeal strengthening exercises;Functional tasks;SLP instruction and feedback;Compensatory strategies;Internal/external aids;Cognitive reorganization;Patient/family education;Language facilitation    Potential to Achieve Goals Good    SLP Home Exercise Plan loud /a/ and reading out loud    Consulted and Agree with Plan of Care Patient             Patient will benefit from skilled therapeutic intervention in order to improve the following deficits and impairments:   Dysarthria    Problem List Patient Active Problem List   Diagnosis Date Noted   Parkinson's disease ([place]) 02/17/2020   Gait abnormality 12/04/2019   Prediabetes 12/03/2018   Vitamin D deficiency 12/03/2018   Anemia due to vitamin B12 deficiency 12/03/2018   Fatigue 12/03/2018   Urinary frequency 10/01/2018   Vitamin B12 deficiency 10/01/2018   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

## 2021-04-02 ENCOUNTER — Other Ambulatory Visit: Payer: Self-pay | Admitting: Nurse Practitioner

## 2021-04-02 DIAGNOSIS — R600 Localized edema: Secondary | ICD-10-CM

## 2021-04-16 ENCOUNTER — Encounter: Payer: Self-pay | Admitting: Nurse Practitioner

## 2021-04-21 ENCOUNTER — Encounter: Payer: Medicare Other | Attending: Psychology | Admitting: Psychology

## 2021-04-21 ENCOUNTER — Encounter: Payer: Self-pay | Admitting: Psychology

## 2021-04-21 ENCOUNTER — Other Ambulatory Visit: Payer: Self-pay

## 2021-04-21 DIAGNOSIS — G20 Parkinson's disease: Secondary | ICD-10-CM | POA: Insufficient documentation

## 2021-04-21 DIAGNOSIS — R269 Unspecified abnormalities of gait and mobility: Secondary | ICD-10-CM | POA: Insufficient documentation

## 2021-04-21 DIAGNOSIS — R413 Other amnesia: Secondary | ICD-10-CM | POA: Insufficient documentation

## 2021-04-21 NOTE — Progress Notes (Signed)
Neuropsychological Consultation   Patient:   Denise [name]   DOB:   [birth year]  MR Number:  [id number]  Location:  [facility] FOR PAIN AND [facility] MEDICINE [facility] PHYSICAL MEDICINE AND REHABILITATION [place], [place] [id number] [place] [id number] Dept: [phone]           Date of Service:   04/21/2021  Start Time:   10 AM End Time:   12 PM  Today's visit was an in person visit was conducted in my outpatient clinic office.  The patient, her daughter and myself were present for this visit.  1 hour and 15 minutes was spent in formal face-to-face clinical interview and the other 45 minutes was spent with records review, report writing and setting up testing protocols.  Provider/Observer:  Ilean Skill, Psy.D.       Clinical Neuropsychologist       Billing Code/Service: 96116/96121  Chief Complaint:    Denise [name] is a 70 year old female referred for neuropsychological evaluation by Sarina Ill, MD as part of a larger neurological work-up after referral from the patient's PCP Minette Brine, FNP following identifications of changes in motor functions and gait.  The patient is now being followed by Dr. Buck Mam who is a Parkinson's disease specialist.  The patient has a past medical history that includes chronic kidney disease, obesity, TB, osteopenia, glaucoma with optic atrophy, B12 deficiency, fatigue, and prediabetes.  She is also been diagnosed or identified with hypophonia, hypokalemia, micrographia, shuffling gait, decreased arm swing, stooped posture, bradykinetic with suspected  Parkinson's disease spectrum and rule out of Lewy body type condition or other etiological factors.  The purpose of this neuropsychological evaluation is twofold with the first to establish objective baseline measurements of current cognitive functioning to be able to provide useful information for ongoing and potentially future therapeutic interventions as well  as add specific information around diagnostic considerations.  Reason for Service:  Denise [name] is a 70 year old female referred for neuropsychological evaluation by Sarina Ill, MD as part of a larger neurological work-up after referral from the patient's PCP Minette Brine, FNP following identifications of changes in motor functions and gait.  The patient is now being followed by Dr. Buck Mam who is a Parkinson's disease specialist.  The patient has a past medical history that includes chronic kidney disease, obesity, TB, osteopenia, glaucoma with optic atrophy, B12 deficiency, fatigue, and prediabetes.  She is also been diagnosed or identified with hypophonia, hypokalemia, micrographia, shuffling gait, decreased arm swing, stooped posture, bradykinetic with suspected  Parkinson's disease spectrum and rule out of Lewy body type condition or other etiological factors.  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  Patient has reported having episodes where she will freeze up particularly early on in the presentation of her condition as well as tensing up when she walks, stiffness and the development of micrographia.  The patient denies any visual or auditory hallucination and does not present with particular tremors.  The patient denies knowledge of any family history of Parkinson's or other degenerative conditions other than her father being diagnosed in passing away from [place].  The patient and her daughter reported today that they for started noticing changes in her gait early last year.  The daughter encouraged the patient to speak with her primary care about this issue and gait changes were further identified.  Initially, the patient would get "frozen" when she was walking and have other  gait changes.  She initially saw a chiropractor attributing it potentially to something in her back.  The patient has also noted  some changes in cognitive functioning including some mild memory deficits and changes in her verbal fluency and word finding speed.  The patient reports that she is now started taking medicine specifically for Parkinson's including carbidopa-levodopa and feels like this medicine has helped with her motor functioning.  She is also been actively working with speech therapy around her expressive language and micrographia as well as physical therapy for her gait and feels like these therapeutic interventions have helped significantly.  The patient denies any significant increase in mood disturbance such as depression or anxiety but this is frustrating to the patient.  She reports that she is sleeping well and has a good appetite and engages in is much physical activity as possible.  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  The patient is also had an MRI conducted on 12/06/2019 that showed some scattered T2/FLAIR hyperintense foci in the subcortical and deep white matter that was nonspecific and most likely representing very mild chronic microvascular ischemic changes.  There was mild generalized cortical atrophy noted in the medial temporal lobes consistent with age and there were no acute findings.  Behavioral Observation: Denise [name]  presents as a 70 y.o.-year-old Right handed African American Female who appeared her stated age. her dress was Appropriate and she was Well Groomed and her manners were Appropriate to the situation.  her participation was indicative of Appropriate and Attentive behaviors.  There were physical disabilities noted.  The patient had a very stiff gait, flat affect with masklike facial features but no tremors noted.  she displayed an appropriate level of cooperation and motivation.     Interactions:    Active  Appropriate  Attention:   within normal limits and attention span and concentration were age appropriate  Memory:   abnormal; remote memory intact, recent memory impaired  Visuo-spatial:  not examined  Speech (Volume):  low  Speech:   normal; slowed verbal responses  Thought Process:  Coherent and Relevant  Though Content:  WNL; not suicidal and not homicidal  Orientation:   person, place, time/date, and situation  Judgment:   Fair  Planning:   Fair  Affect:    Appropriate  Mood:    Dysphoric  Insight:   Good  Intelligence:   normal  Marital Status/Living: The patient was born and raised in [place] along with 3 siblings.  Only major childhood illnesses were noted related to the development of tuberculosis when she was a child as well as kidney disease.  The patient currently lives with her daughter and grandmother and has been with him off and on for roughly 10 years and stays with them now every day since June of this year.  The patient is widowed and was married for 37 years with no previous marriages.  She has 3 adult children age 5, 25 and 44.  Current Employment: The patient is retired.  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  She also worked in Therapist, art in the past.  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  She worked for 28 years as a Theme park manager.  Hobbies and interest have included watching old  TV shows and movies.  She also enjoys taking short road trips.  Substance Use:  No concerns of substance abuse are reported.  Education:   The patient has attended [employer] and [employer].  She has completed her masters in theology degree.  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   Abnormal auditory perception of both ears 05/10/2016   Osteopenia 08/23/2012    Psychiatric History:  No prior psychiatric history noted  Family Med/Psych History:  Family History  Problem Relation Age of Onset   Hypertension Mother    Heart disease Mother    Diabetes Father    Hypertension Father    Other Father        had a condition that was "a cousin" to ALS    Diabetes Sister    Tuberculosis Maternal Grandmother    Heart disease Sister    Colitis Daughter    Colon cancer Neg Hx    Colon polyps Neg Hx    Esophageal cancer Neg Hx    Rectal cancer Neg Hx    Stomach cancer Neg Hx    Impression/DX:  Denise [name] is a 70 year old female referred for neuropsychological evaluation by Sarina Ill, MD as part of a larger neurological work-up after referral from the patient's PCP Minette Brine, FNP following identifications of changes in motor functions and gait.  The patient is now being followed by Dr. Buck Mam who is a Parkinson's disease specialist.  The patient has a past medical history that includes chronic kidney disease, obesity, TB, osteopenia, glaucoma with optic atrophy, B12 deficiency, fatigue, and prediabetes.  She is also been diagnosed or identified with hypophonia, hypokalemia, micrographia, shuffling gait, decreased arm swing, stooped posture, bradykinetic with suspected  Parkinson's disease spectrum and rule out of Lewy body type condition or other etiological factors.  The purpose of this  neuropsychological evaluation is twofold with the first to establish objective baseline measurements of current cognitive functioning to be able to provide useful information for ongoing and potentially future therapeutic interventions as well as add specific information around diagnostic considerations.  Disposition/Plan:  We have set the patient up for formal neuropsychological testing for diagnostic and baseline purposes.  She will complete the RBANS repeatable neuropsychological battery to allow for specific baseline testing as well as completing the controlled oral Word Association test and we will also do grooved pegboard, finger tapping test and the hand dynamometer test.  Once these are completed a determination will be made as to the potential need for any other further testing or whether this will be sufficient for our intended purposes.  Diagnosis:    Parkinson's disease ([place])  Gait abnormality  Memory changes         Electronically Signed   _______________________ Ilean Skill, Psy.D. Clinical Neuropsychologist

## 2021-04-27 DIAGNOSIS — H524 Presbyopia: Secondary | ICD-10-CM | POA: Diagnosis not present

## 2021-04-29 ENCOUNTER — Other Ambulatory Visit: Payer: Self-pay

## 2021-04-29 DIAGNOSIS — R269 Unspecified abnormalities of gait and mobility: Secondary | ICD-10-CM | POA: Diagnosis not present

## 2021-04-29 DIAGNOSIS — G20 Parkinson's disease: Secondary | ICD-10-CM

## 2021-04-29 DIAGNOSIS — R413 Other amnesia: Secondary | ICD-10-CM | POA: Diagnosis not present

## 2021-04-29 NOTE — Progress Notes (Signed)
   Behavioral Observation  The patient appeared well-groomed and appropriately dressed. Her manners were polite and appropriate to the situation. She demonstrated a positive attitude toward testing and showed good effort.   Neuropsychology Note  Denise [name] completed 60 minutes of neuropsychological testing with technician, Dina Rich, BA, under the supervision of Ilean Skill, PsyD., Clinical Neuropsychologist. The patient did not appear overtly distressed by the testing session, per behavioral observation or via self-report to the technician. Rest breaks were offered.   Clinical Decision Making: In considering the patient's current level of functioning, level of presumed impairment, nature of symptoms, emotional and behavioral responses during clinical interview, level of literacy, and observed level of motivation/effort, a battery of tests was selected by Dr. Sima Matas during initial consultation on 04/21/2021. This was communicated to the technician. Communication between the neuropsychologist and technician was ongoing throughout the testing session and changes were made as deemed necessary based on patient performance on testing, technician observations and additional pertinent factors such as those listed above.  Tests Administered: Controlled Oral Word Association Test (COWAT; FAS & Animals)  Finger Tapping Test (FTT) Grooved Pegboard Hand Dynamometer  Repeatable Battery for the Assessment of Neuropsychological Status (RBANS); Form A  Results:  COWAT FAS Total = 29 Z = -1.07 Animals Total = 20 Z = 0.43    Finger Tapping Test Right: 14 18 19 22 20   Left: 17 19 18 21 20      Hand Dynamometer Right: 15 20 Left:  14 13     Grooved Pegboard Right: Time = 2:14.27 # Drops = 2 Left: Time = 7:41.31 # Drops = 0       Results of the RBANS-A will be included in final report   Feedback to Patient: Denise [name] will return on 07/08/2020 for  an interactive feedback session with Dr. Sima Matas at which time her test performances, clinical impressions and treatment recommendations will be reviewed in detail. The patient understands she can contact our office should she require our assistance before this time.  60 minutes spent face-to-face with patient administering standardized tests, 30 minutes spent scoring Environmental education officer). [CPT [id number], 41287]  Full report to follow.

## 2021-05-07 IMAGING — NM NM DATSCAN
2 series · 12 of 12 positions shown · non-contrast
Comparison: Brain MRI 12/06/2019

CLINICAL DATA: Abnormal gait. RIGHT hand tremor. 68-year-old
female.

EXAM:
NUCLEAR MEDICINE BRAIN IMAGING WITH SPECT  (DaTscan )
TECHNIQUE: SPECT images of the brain were obtained after intravenous injection
of radiopharmaceutical. 4 hour post injection imaging. Appropriate
positioning. 0.8 ml lugols solution administered in a.m
RADIOPHARMACEUTICALS:  4.2 millicuries I 123 Ioflupane

[Series 1: cor spect - (id) _(id)_cor · 4.1mm · 4.14mm/px · 6 of 128 frames shown]
[frame 11/128]
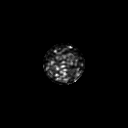
[frame 32/128]
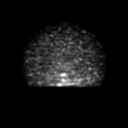
[frame 54/128]
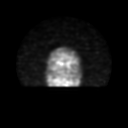
[frame 75/128]
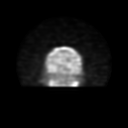
[frame 96/128]
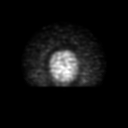
[frame 118/128]
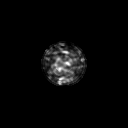

[Series 1: tra spect - (id) _(id)_tra · 4.1mm · 4.14mm/px · 6 of 128 frames shown]
[frame 11/128]
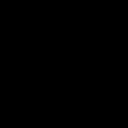
[frame 32/128]
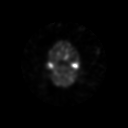
[frame 54/128]
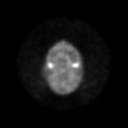
[frame 75/128]
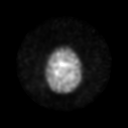
[frame 96/128]
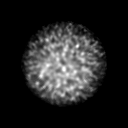
[frame 118/128]
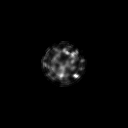

[12 of 12 positions shown; findings below may reference images not displayed]

FINDINGS: Bilateral decreased counts within the posterior striatum (putamen).
The activity is near absent in the posterior striatum and
symmetrical. Additionally, the relative radiotracer activity within
the heads of the caudate nuclei is reduced.
IMPRESSION: Near absent radiotracer within the bilateral posterior striatum is a
pattern which can be found in Parkinson's syndrome pathology.

Of note, DaTSCAN is not diagnostic of Parkinsonian syndromes, which
remains a clinical diagnosis. DaTscan is an adjuvant test to aid in
the clinical diagnosis of Parkinsonian syndromes.

These results will be called to the ordering clinician or
representative by the Radiologist Assistant, and communication
documented in the PACS or [REDACTED].

## 2021-05-10 ENCOUNTER — Other Ambulatory Visit: Payer: Self-pay

## 2021-05-10 ENCOUNTER — Ambulatory Visit (INDEPENDENT_AMBULATORY_CARE_PROVIDER_SITE_OTHER): Payer: Medicare Other | Admitting: Nurse Practitioner

## 2021-05-10 ENCOUNTER — Encounter: Payer: Self-pay | Admitting: Nurse Practitioner

## 2021-05-10 VITALS — BP 118/80 | HR 56 | Temp 98.4°F | Ht 63.8 in | Wt 127.0 lb

## 2021-05-10 DIAGNOSIS — R7303 Prediabetes: Secondary | ICD-10-CM | POA: Diagnosis not present

## 2021-05-10 DIAGNOSIS — G20 Parkinson's disease: Secondary | ICD-10-CM

## 2021-05-10 NOTE — Progress Notes (Signed)
I,Yamilka J Llittleton,acting as a Education administrator for [employer], FNP.,have documented all relevant documentation on the behalf of Minette Brine, FNP,as directed by  Minette Brine, FNP while in the presence of Minette Brine, [place].  This visit occurred during the SARS-CoV-2 public health emergency.  Safety protocols were in place, including screening questions prior to the visit, additional usage of staff PPE, and extensive cleaning of exam room while observing appropriate contact time as indicated for disinfecting solutions.  Subjective:     Patient ID: Denise [name] , female    DOB: [birth year] , 70 y.o.   MRN: [id number]   Chief Complaint  Patient presents with   Prediabetes    HPI  Patient presents today for a prediabetes f/u.  She is doing grief counseling every Wednesday. She has seen Neuropsychology for testing and is scheduled to f/u in December and January 2023. Her daughter is here with her today  Wt Readings from Last 3 Encounters: 05/10/21 : 127 lb (57.6 kg) 03/19/21 : 130 lb (59 kg) 02/01/21 : 129 lb 9.6 oz (58.8 kg)    Hyperlipidemia This is a chronic problem. The current episode started more than 1 year ago. The problem is controlled. Recent lipid tests were reviewed and are normal. She has no history of chronic renal disease. There are no known factors aggravating her hyperlipidemia. Pertinent negatives include no chest pain. She is currently on no antihyperlipidemic treatment. There are no compliance problems.  Risk factors for coronary artery disease include a sedentary lifestyle.    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     Family History  Problem Relation Age of Onset   Hypertension Mother    Heart disease Mother    Diabetes Father    Hypertension Father    Other Father        had a condition that was "a cousin" to  ALS    Diabetes Sister    Tuberculosis Maternal Grandmother    Heart disease Sister    Colitis Daughter    Colon cancer Neg Hx    Colon polyps Neg Hx    Esophageal cancer Neg Hx    Rectal cancer Neg Hx    Stomach cancer Neg Hx      Current Outpatient Medications:    atorvastatin (LIPITOR) 10 MG tablet, TAKE 1 TABLET BY MOUTH AT BEDTIME MON - FRI, Disp: 90 tablet, Rfl: 1   Calcium Carb-Cholecalciferol (OYSTER SHELL CALCIUM/VITAMIN D) 500-200 MG-UNIT PACK, Take by mouth., Disp: , Rfl:    carbidopa-levodopa (SINEMET IR) 25-100 MG tablet, Take at 8,12 and 4pm. Try to avoid protein., Disp: 270 tablet, Rfl: 4   Cholecalciferol (VITAMIN D3) 10 MCG (400 UNIT) CAPS, Take 1 capsule by mouth daily. Pt unaware of dose, Disp: , Rfl:    hydrochlorothiazide (HYDRODIURIL) 12.5 MG tablet, TAKE 1 TABLET BY MOUTH DAILY AS NEEDED FOR LEG SWELLING, Disp: 90 tablet, Rfl: 1   timolol (BETIMOL) 0.25 % ophthalmic solution, 1-2 drops daily. , Disp: , Rfl:    Travoprost, BAK Free, (TRAVATAN) 0.004 % SOLN ophthalmic solution, 1 drop at bedtime., Disp: , Rfl:    Allergies  Allergen Reactions   Nystatin-Triamcinolone Swelling    BLISTERS   Triamcinolone Acetonide     Blisters    Cephalexin Itching  and Rash     Review of Systems  Constitutional: Negative.  Negative for fatigue.  Respiratory: Negative.    Cardiovascular:  Negative for chest pain, palpitations and leg swelling.  Neurological:  Negative for dizziness and headaches.  Psychiatric/Behavioral: Negative.      Today's Vitals   05/10/21 1132  BP: 118/80  Pulse: (!) 56  Temp: 98.4 F (36.9 C)  Weight: 127 lb (57.6 kg)  Height: 5' 3.8" (1.621 m)  PainSc: 0-No pain   Body mass index is 21.94 kg/m.   Objective:  Physical Exam Vitals reviewed.  Constitutional:      General: She is not in acute distress.    Appearance: Normal appearance.  Cardiovascular:     Rate and Rhythm: Normal rate and regular rhythm.     Pulses: Normal pulses.      Heart sounds: Normal heart sounds. No murmur heard. Pulmonary:     Effort: Pulmonary effort is normal. No respiratory distress.     Breath sounds: Normal breath sounds. No wheezing.  Musculoskeletal:        General: No swelling or tenderness.     Right lower leg: No edema.     Left lower leg: No edema.  Skin:    Capillary Refill: Capillary refill takes less than 2 seconds.  Neurological:     General: No focal deficit present.     Mental Status: She is alert and oriented to person, place, and time.     Cranial Nerves: No cranial nerve deficit.     Motor: No weakness.     Comments: Gait is slow and steady  Psychiatric:        Mood and Affect: Mood normal.        Speech: Speech is delayed.        Behavior: Behavior normal.        Thought Content: Thought content normal.        Judgment: Judgment normal.        Assessment And Plan:     1. Prediabetes     Patient was given opportunity to ask questions. Patient verbalized understanding of the plan and was able to repeat key elements of the plan. All questions were answered to their satisfaction.  Minette Brine, FNP   I, Minette Brine, FNP, have reviewed all documentation for this visit. The documentation on 05/10/21 for the exam, diagnosis, procedures, and orders are all accurate and complete.   IF YOU HAVE BEEN REFERRED TO A SPECIALIST, IT MAY TAKE 1-2 WEEKS TO SCHEDULE/PROCESS THE REFERRAL. IF YOU HAVE NOT HEARD FROM US/SPECIALIST IN TWO WEEKS, PLEASE GIVE [place] A CALL AT [phone] X [phone].   THE PATIENT IS ENCOURAGED TO PRACTICE SOCIAL DISTANCING DUE TO THE COVID-19 PANDEMIC.

## 2021-05-10 NOTE — Patient Instructions (Signed)

## 2021-05-12 ENCOUNTER — Encounter: Payer: Self-pay | Admitting: Nurse Practitioner

## 2021-05-18 ENCOUNTER — Other Ambulatory Visit: Payer: Self-pay | Admitting: Nurse Practitioner

## 2021-05-18 DIAGNOSIS — I2510 Atherosclerotic heart disease of native coronary artery without angina pectoris: Secondary | ICD-10-CM

## 2021-05-26 ENCOUNTER — Encounter: Payer: Medicare Other | Attending: Psychology | Admitting: Psychology

## 2021-05-26 ENCOUNTER — Other Ambulatory Visit: Payer: Self-pay

## 2021-05-26 DIAGNOSIS — G20 Parkinson's disease: Secondary | ICD-10-CM | POA: Insufficient documentation

## 2021-05-26 DIAGNOSIS — R269 Unspecified abnormalities of gait and mobility: Secondary | ICD-10-CM | POA: Insufficient documentation

## 2021-05-26 DIAGNOSIS — R413 Other amnesia: Secondary | ICD-10-CM | POA: Insufficient documentation

## 2021-05-26 NOTE — Progress Notes (Signed)
Neuropsychological Evaluation   Patient:  Denise [name]   DOB: 01/18/1951  MR Number: [id number]  Location: [facility] PHYSICAL MEDICINE AND REHABILITATION [place], [place] [id number] [place] [id number] Dept: [phone]  Start: 3 PM End: 4 PM  Provider/Observer:     Edgardo Roys PsyD  Chief Complaint:     No chief complaint on file.   Reason For Service:     Denise [name] is a 70 year old female referred for neuropsychological evaluation by Sarina Ill, MD as part of a larger neurological work-up after referral from the patient's PCP Denise Brine, FNP following identifications of changes in motor functions and gait.  The patient is now being followed by Dr. Buck Mam who is a Parkinson's disease specialist.  The patient has a past medical history that includes chronic kidney disease, obesity, TB, osteopenia, glaucoma with optic atrophy, B12 deficiency, fatigue, and prediabetes.  She has also been diagnosed or identified with hypophonia, hypokalemia, micrographia, shuffling gait, decreased arm swing, stooped posture, bradykinetic with suspected  Parkinson's disease spectrum and rule out of Lewy body type condition or other etiological factors.  The patient has no identified specific tremor but has likely symptoms consistent with akinetic/rigid variant of Parkinson's disease.  Patient has reported having episodes where she will freeze up particularly early on in the presentation of her condition as well as tensing up when she walks, stiffness and the development of micrographia.  The patient denies any visual or auditory hallucination and does not present with particular tremors.  The patient denies knowledge of any family history of Parkinson's or other degenerative conditions other than her father being diagnosed in passing away from [place].  The patient and her daughter reported today that they started  noticing changes in her gait early last year.  The daughter encouraged the patient to speak with her primary care about this issue and gait changes were further identified.  Initially, the patient would get "frozen" when she was walking and have other gait changes.  She initially saw a chiropractor attributing it potentially to something in her back.  The patient has also noted some changes in cognitive functioning including some mild memory deficits and changes in her verbal fluency and word finding speed.  The patient reports that she has now started taking medicine specifically for Parkinson's including carbidopa-levodopa and feels like this medicine has helped with her motor functioning.  She has also been actively working with speech therapy around her expressive language and micrographia as well as physical therapy for her gait and feels like these therapeutic interventions have helped significantly.  The patient denies any significant increase in mood disturbance such as depression or anxiety but this is frustrating to the patient.  She reports that she is sleeping well and has a good appetite and engages in areas much physical activity as possible.   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  The patient has also had an MRI conducted on 12/06/2019 that showed some scattered T2/FLAIR hyperintense foci in the subcortical and deep white matter that was nonspecific and most likely representing very mild chronic microvascular ischemic changes.  There was mild generalized cortical atrophy noted in the medial temporal lobes consistent with age and there were no acute findings.  Tests Administered: Controlled Oral Word Association Test (COWAT; FAS & Animals)  Finger Tapping  Test (FTT) Grooved Pegboard Hand Dynamometer  Repeatable Battery for the Assessment of  Neuropsychological Status (RBANS); Form A  Participation Level:   Active  Participation Quality:  Appropriate      Behavioral Observation:  The patient appeared well-groomed and appropriately dressed. Her manners were polite and appropriate to the situation. She demonstrated a positive attitude toward testing and showed good effort.    Well Groomed, Alert, and Appropriate.   Test Results:     COWAT FAS Total = 29 Z = -1.07 Animals Total = 20 Z = 0.43   The patient was administered the controlled oral Word Association test to assess verbal fluency in both phonetic and targeted naming challenge.  The patient showed some mild deficits with regard to phonetic naming performing 1 standard deviation below normative expectations.  She did well on targeted animal naming and performed within normal limits for animal naming.   Finger Tapping Test Right: 14 18 19 22 20   Left: 17 19 18 21 20    The patient was administered the finger tapping test to assess for fine motor speed and showed consistent performances between right dominant hand and left nondominant hand.  Her performances were slowed relative to normative expectations but she did not deteriorate as trials progressed performing better towards the end than she did on the initial challenges.  There was no indication of lateralization on these performances and other than slowed fine motor speed there were no other deficits noted on this task.     Hand Dynamometer Right: 15 20 Left:  14 13   The patient was administered the hand dynamometer test which looks at muscle strength and hand strength.  The patient's muscle strength was significantly below predicted levels based on normative expectations but were consistent between dominant right hand and nondominant left hand.  The patient did somewhat better for her dominant hand.  Deficits were noted with regard to motor strength.     Grooved Pegboard Right: Time = 2:14.27 #  Drops = 2 Left: Time = 7:41.31 # Drops = 0 The patient was also administered the grooved pegboard test.  The patient continued to show significant slowing in psychomotor speed and fine motor control.  Significant deficits were noted on the right dominant hand completing the task very slowly with 2 drops of the manipulation keys.  For her left nondominant hand the patient showed significant deficits for fine motor control requiring 7-1/2 minutes to complete the task.  There were no drops on this but she was particularly cautious during this task to avoid drops that she had had on her right dominant hand.  Overall, the patient displayed slowed fine motor speed, reduced grip strength performance as well as significant deficits from fine motor control.  There was no lateralization type of patterns noted with the exception of greater fine motor control and sensorimotor deficits for left hand versus right hand but motor speed and grip strength were consistent between dominant nondominant hands.      RBANS Update Form A Total Scale 74  The patient was administered the repeatable battery for the assessment of neuropsychological status inventory.  The patient produced a total scale score of 74 which falls in the borderline range relative to a normative population and suggest 1 or more areas of significant deficits.  This composite score is a good indicator of general cognitive functioning and her pattern suggest cognitive impairment on multiple domains.  RBANS Update Form A Language 92 Semantic Fluency7 Picture [phone]  The patient  did relatively well and performed in the average range on measures of expressive language capacity.  This is generally consistent with findings seen on the controlled oral Word Association test.  The patient did better on targeted naming and she did on somatic fluency which is also the same pattern identified on other expressive language functioning.  In general, expressive  language capacity appears to be intact as well as receptive language capacity.   RBANS Update Form A Visuospatial/ Constructional 64 Figure Copy 5 Line Orientation <=2  The patient showed significant deficits for her visual spatial/visual constructional capacity producing a standard score of 64 which falls in the extremely low range relative to a normative population.  This assessment identifies basic visual spatial perception and ability to copy design from model and her deficits suggest difficulties with processing and using visual spatial information effectively.  The patient performed very poorly on figure copy with even greater deficits identified for the Line orientation subtest which requires visual estimation and judgment capacities as well.   RBANS Update Form A Attention 85 Coding 3 Digit Span 12  On the attention subtest the patient produced a standard score of 85 which falls in the low average range and only slightly below predicted levels of premorbid functioning.  There was significant scatter between subtest performance.  The patient had significant difficulties on measures of visual scanning, visual searching and overall speed of mental operations that require focus execute aspect of attention.  However, on pure auditory encoding measures of attention the patient did quite well producing a standard score of 12 on the digit span subtest which is in the high average range relative to a normative population.  Attentional weaknesses were noted with regard to measures requiring a great deal of motor function and visual scanning function but did very well on her ability to encode new information.    RBANS Update Form A Immediate Memory 76 List Learning 5 Story Memory 6  On the immediate memory index score the patient produced a 76 index score which falls in the borderline range relative to a normative population.  The patient showed moderate deficits with regard to initially learning  both the list of words as well as story learning.  This is a marked contrast to her excellent encoding capacity and this pattern suggest that she has difficulty learning new information and transferring that information into memory stores effectively.  She does well encoding and so therefore the deficits appear to be related to storage and organization rather than an inability to attend to new information.    RBANS Update Form A Delayed Memory 78 List Recall3-9 Story Recall8 Figure Recall5 List Recognition10-16  On the delayed memory index the patient produced a delayed memory index score of 78 which falls in the borderline range relative to a normative population and is consistent with score she produced on the immediate memory index.  While the patient is having significant difficulties with storage and organization of information the information that does get stored appears to be effectively maintained over a period of time and there was not a significant loss of information beyond her difficulty initially storing organization after delay.  The patient did not improve under recognition formats and therefore her memory deficits do not appear to be related to retrieval of information.  Overall, the patient did very well on initial auditory encoding capacity and she also maintained the information that was initially stored and organized over period of delay.  Deficits were primarily related to  impaired ability to store and organize new information and there were no indications of it being a primary retrieval issue with her memory and learning deficits.   Summary of Results:   Overall, the results of the current neuropsychological evaluation are generally consistent subjective reports with changes primarily related to motor functioning and to a lesser degree related to difficulties storing and organizing (learning) new information.  The patient showed significant deficits with regard to motor speed, fine  motor control and overall muscle strength gait abnormalities have been identified previously and were observed during clinical interview.  The patient's receptive and expressive language appear to be generally well-preserved and her auditory encoding remains quite good.  Patient's ability to engage in active conversation remains quite well-preserved.  The patient's slowed information processing speed appears to be primarily related to primary motor deficits versus slowed cortical information processing speed.  The patient does show memory deficits and they are primary related to difficulties with storage and organization.  The patient is able to retain that information that she does initially learned over time and therefore there is no indication of degradation or loss of information over time.  Encoding was within normal limits.  The patient did not improve under recognition or cued recall formats.  Impression/Diagnosis:   The results of the current neuropsychological evaluation are consistent with subjective reports of changes in motor functioning, which would also be consistent with a diagnosis of Parkinson's disease.  There are some cognitive issues outside of her motor deficits that are noted on the objective neuropsychological evaluation including deficits with regard to visual scanning and motor speed, initial storage and organization of new information.  The patient retains good expressive language functioning, excellent auditory encoding capacity as well as the ability to retain information that is initially stored over a period of delay.  The pattern of cognitive strengths and weaknesses in overall medical history are not consistent with a diagnosis of cortical dementia such as Alzheimer's or Lewy body dementia.  The patient is not displaying any particular tremors and is denying any visual or auditory hallucinations or other changes in personality and behavior.  While the patient had difficulty storing  new information the information that was initially stored was maintained over period of delay and there was no significant loss of information with delay.  The patient does meet the criterion for dementia but it would be primarily related to symptoms associated with Parkinson's.  Gait changes in motor deficits, difficulties with memory are all noted.  Memory impairment along with apraxia along with changes in her overall function would be consistent with a neurocognitive disorder.  I will sit down with the patient and family and go over the results of the current neuropsychological evaluation.  Again, the results of the current neuropsychological evaluation would be consistent with a primary degenerative psychomotor process with some impacts on memory and learning and other capacities being well maintained.  Diagnosis:    Parkinson's disease ([place])  Gait abnormality  Memory changes   _____________________ Ilean Skill, Psy.D. Clinical Neuropsychologist

## 2021-06-02 DIAGNOSIS — R269 Unspecified abnormalities of gait and mobility: Secondary | ICD-10-CM | POA: Diagnosis not present

## 2021-06-02 DIAGNOSIS — Z79899 Other long term (current) drug therapy: Secondary | ICD-10-CM | POA: Diagnosis not present

## 2021-06-02 DIAGNOSIS — G20 Parkinson's disease: Secondary | ICD-10-CM | POA: Diagnosis not present

## 2021-06-03 ENCOUNTER — Encounter: Payer: Medicare Other | Admitting: Psychology

## 2021-06-03 ENCOUNTER — Other Ambulatory Visit: Payer: Self-pay

## 2021-06-03 ENCOUNTER — Encounter: Payer: Medicare Other | Attending: Psychology | Admitting: Psychology

## 2021-06-03 DIAGNOSIS — R413 Other amnesia: Secondary | ICD-10-CM | POA: Insufficient documentation

## 2021-06-03 DIAGNOSIS — G20 Parkinson's disease: Secondary | ICD-10-CM | POA: Diagnosis not present

## 2021-06-03 DIAGNOSIS — R269 Unspecified abnormalities of gait and mobility: Secondary | ICD-10-CM | POA: Diagnosis not present

## 2021-06-03 NOTE — Progress Notes (Signed)
06/03/2021 3 PM-4 PM:  Today's visit was an in person visit that was conducted in my outpatient clinic office.  The patient, her daughter and granddaughter were present for this visit.  We reviewed the results of the recent neuropsychological evaluation and I provided a copy of the report to the patient and her family.  We went over the reasons for identifying this as primarily Parkinson's dementia and explained the results of the assessment with recommendations going forward.  This is likely a progressive condition but given the patient's age it is unclear how fast it will progress.  We talked about the need for her daughter to have assistance from other family members in the future as the patient's need may be increasing over time.  I answered the questions from the patient that she had regarding the condition the best I could.  At this point we did not set up any follow-up appointments but instructed the family that if there were significant questions that needed to be answered in the future or changes outside of the predicted course the fact that repeat testing may be warranted but at this point we will not schedule any.    Summary of Results:                        Overall, the results of the current neuropsychological evaluation are generally consistent subjective reports with changes primarily related to motor functioning and to a lesser degree related to difficulties storing and organizing (learning) new information.  The patient showed significant deficits with regard to motor speed, fine motor control and overall muscle strength gait abnormalities have been identified previously and were observed during clinical interview.  The patient's receptive and expressive language appear to be generally well-preserved and her auditory encoding remains quite good.  Patient's ability to engage in active conversation remains quite well-preserved.  The patient's slowed information processing speed appears to be  primarily related to primary motor deficits versus slowed cortical information processing speed.  The patient does show memory deficits and they are primary related to difficulties with storage and organization.  The patient is able to retain that information that she does initially learned over time and therefore there is no indication of degradation or loss of information over time.  Encoding was within normal limits.  The patient did not improve under recognition or cued recall formats.   Impression/Diagnosis:                     The results of the current neuropsychological evaluation are consistent with subjective reports of changes in motor functioning, which would also be consistent with a diagnosis of Parkinson's disease.  There are some cognitive issues outside of her motor deficits that are noted on the objective neuropsychological evaluation including deficits with regard to visual scanning and motor speed, initial storage and organization of new information.  The patient retains good expressive language functioning, excellent auditory encoding capacity as well as the ability to retain information that is initially stored over a period of delay.  The pattern of cognitive strengths and weaknesses in overall medical history are not consistent with a diagnosis of cortical dementia such as Alzheimer's or Lewy body dementia.  The patient is not displaying any particular tremors and is denying any visual or auditory hallucinations or other changes in personality and behavior.  While the patient had difficulty storing new information the information that was initially stored was maintained over period of  delay and there was no significant loss of information with delay.  The patient does meet the criterion for dementia but it would be primarily related to symptoms associated with Parkinson's.  Gait changes in motor deficits, difficulties with memory are all noted.  Memory impairment along with apraxia along  with changes in her overall function would be consistent with a neurocognitive disorder.  I will sit down with the patient and family and go over the results of the current neuropsychological evaluation.  Again, the results of the current neuropsychological evaluation would be consistent with a primary degenerative psychomotor process with some impacts on memory and learning and other capacities being well maintained.   Diagnosis:                                Parkinson's disease ([place])   Gait abnormality   Memory changes     _____________________ Ilean Skill, Psy.D. Clinical Neuropsychologist

## 2021-06-10 DIAGNOSIS — H2513 Age-related nuclear cataract, bilateral: Secondary | ICD-10-CM | POA: Diagnosis not present

## 2021-06-10 DIAGNOSIS — H04123 Dry eye syndrome of bilateral lacrimal glands: Secondary | ICD-10-CM | POA: Diagnosis not present

## 2021-06-10 DIAGNOSIS — H401132 Primary open-angle glaucoma, bilateral, moderate stage: Secondary | ICD-10-CM | POA: Diagnosis not present

## 2021-06-14 ENCOUNTER — Ambulatory Visit: Payer: Self-pay | Admitting: Psychology

## 2021-06-14 ENCOUNTER — Encounter: Payer: Self-pay | Admitting: Nurse Practitioner

## 2021-06-21 DIAGNOSIS — Z1231 Encounter for screening mammogram for malignant neoplasm of breast: Secondary | ICD-10-CM | POA: Diagnosis not present

## 2021-06-21 LAB — HM MAMMOGRAPHY

## 2021-06-22 ENCOUNTER — Encounter: Payer: Self-pay | Admitting: Nurse Practitioner

## 2021-07-08 ENCOUNTER — Ambulatory Visit: Payer: Medicare Other | Admitting: Psychology

## 2021-07-21 ENCOUNTER — Ambulatory Visit
Admission: RE | Admit: 2021-07-21 | Discharge: 2021-07-21 | Disposition: A | Discharge status: Home / Self Care | Payer: Medicare Other | Source: Ambulatory Visit | Attending: Nurse Practitioner | Admitting: Nurse Practitioner

## 2021-07-21 DIAGNOSIS — M85832 Other specified disorders of bone density and structure, left forearm: Secondary | ICD-10-CM | POA: Diagnosis not present

## 2021-07-21 DIAGNOSIS — Z780 Asymptomatic menopausal state: Secondary | ICD-10-CM | POA: Diagnosis not present

## 2021-07-21 DIAGNOSIS — M8580 Other specified disorders of bone density and structure, unspecified site: Secondary | ICD-10-CM

## 2021-07-21 DIAGNOSIS — M810 Age-related osteoporosis without current pathological fracture: Secondary | ICD-10-CM | POA: Diagnosis not present

## 2021-07-22 ENCOUNTER — Other Ambulatory Visit: Payer: Self-pay | Admitting: Nurse Practitioner

## 2021-07-22 DIAGNOSIS — M810 Age-related osteoporosis without current pathological fracture: Secondary | ICD-10-CM

## 2021-07-22 MED ORDER — ALENDRONATE SODIUM 70 MG PO TABS: 70.0000 mg | ORAL_TABLET | ORAL | 3 refills | Status: DC

## 2021-08-01 ENCOUNTER — Encounter: Payer: Self-pay | Admitting: Nurse Practitioner

## 2021-08-09 ENCOUNTER — Other Ambulatory Visit: Payer: Self-pay

## 2021-08-09 ENCOUNTER — Ambulatory Visit (INDEPENDENT_AMBULATORY_CARE_PROVIDER_SITE_OTHER): Payer: Medicare Other | Admitting: Nurse Practitioner

## 2021-08-09 ENCOUNTER — Encounter: Payer: Self-pay | Admitting: Nurse Practitioner

## 2021-08-09 VITALS — BP 128/64 | HR 64 | Temp 98.2°F | Ht 63.8 in | Wt 129.4 lb

## 2021-08-09 DIAGNOSIS — E559 Vitamin D deficiency, unspecified: Secondary | ICD-10-CM | POA: Diagnosis not present

## 2021-08-09 DIAGNOSIS — Z79899 Other long term (current) drug therapy: Secondary | ICD-10-CM | POA: Diagnosis not present

## 2021-08-09 DIAGNOSIS — Z0000 Encounter for general adult medical examination without abnormal findings: Secondary | ICD-10-CM | POA: Diagnosis not present

## 2021-08-09 DIAGNOSIS — Z23 Encounter for immunization: Secondary | ICD-10-CM

## 2021-08-09 DIAGNOSIS — R7303 Prediabetes: Secondary | ICD-10-CM | POA: Diagnosis not present

## 2021-08-09 DIAGNOSIS — I2510 Atherosclerotic heart disease of native coronary artery without angina pectoris: Secondary | ICD-10-CM

## 2021-08-09 DIAGNOSIS — G20 Parkinson's disease: Secondary | ICD-10-CM | POA: Diagnosis not present

## 2021-08-09 NOTE — Patient Instructions (Signed)

## 2021-08-09 NOTE — Progress Notes (Signed)
I,Tianna Badgett,acting as a Education administrator for [employer], FNP.,have documented all relevant documentation on the behalf of Minette Brine, FNP,as directed by  Minette Brine, FNP while in the presence of Minette Brine, [place].  This visit occurred during the SARS-CoV-2 public health emergency.  Safety protocols were in place, including screening questions prior to the visit, additional usage of staff PPE, and extensive cleaning of exam room while observing appropriate contact time as indicated for disinfecting solutions.  Subjective:     Patient ID: Denise [name] , female    DOB: 11-30-1950 , 71 y.o.   MRN: [id number]   Chief Complaint  Patient presents with   Annual Exam    HPI  Here for hm. Continues to stay with her daughter. Dr. Sima Matas - Neuropsychiatry to treat her parkinson's. Dr. Jaynee Eagles and Dr. Buck Mam [facility] - Neurology).     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 Take with a full glass of water on an empty stomach., Disp: 4 tablet, Rfl: 3   atorvastatin (LIPITOR) 10 MG tablet, TAKE 1 TABLET BY MOUTH EVERY DAY AT BEDTIME MONDAY THROUGH FRIDAY, Disp: 65 tablet, Rfl: 4   Calcium Carb-Cholecalciferol (OYSTER SHELL CALCIUM/VITAMIN D) 500-200 MG-UNIT PACK, Take by  mouth., Disp: , Rfl:    carbidopa-levodopa (SINEMET IR) 25-100 MG tablet, Take at 8,12 and 4pm. Try to avoid protein., Disp: 270 tablet, Rfl: 4   Cholecalciferol (VITAMIN D3) 10 MCG (400 UNIT) CAPS, Take 1 capsule by mouth daily. Pt unaware of dose, Disp: , Rfl:    hydrochlorothiazide (HYDRODIURIL) 12.5 MG tablet, TAKE 1 TABLET BY MOUTH DAILY AS NEEDED FOR LEG SWELLING, Disp: 90 tablet, Rfl: 1   timolol (BETIMOL) 0.25 % ophthalmic solution, 1-2 drops daily. , Disp: , Rfl:    Travoprost, BAK Free, (TRAVATAN) 0.004 % SOLN ophthalmic solution, 1 drop at bedtime., Disp: , Rfl:    Allergies  Allergen Reactions   Nystatin-Triamcinolone Swelling    BLISTERS   Triamcinolone Acetonide     Blisters    Cephalexin Itching and Rash      The patient states she is post menopausal status.   No LMP recorded. Patient is postmenopausal.. Negative for Dysmenorrhea and Negative for Menorrhagia. Negative for: breast discharge, breast lump(s), breast pain and breast self exam. Associated symptoms include abnormal vaginal bleeding. Pertinent negatives include abnormal bleeding (hematology), anxiety, decreased libido, depression, difficulty falling sleep, dyspareunia, history of infertility, nocturia, sexual dysfunction, sleep disturbances, urinary incontinence, urinary urgency, vaginal discharge and  vaginal itching. Diet regular - healthy diet. The patient states her exercise level is moderate - power moves, walks daily (about 30 minutes)  The patient's tobacco use is:  Social History   Tobacco Use  Smoking Status Never  Smokeless Tobacco Never  . She has been exposed to passive smoke. The patient's alcohol use is:  Social History   Substance and Sexual Activity  Alcohol Use Never    Review of Systems  Constitutional: Negative.   HENT: Negative.    Eyes: Negative.   Respiratory: Negative.    Cardiovascular: Negative.   Gastrointestinal: Negative.   Endocrine: Negative.   Genitourinary: Negative.    Musculoskeletal: Negative.   Skin: Negative.   Allergic/Immunologic: Negative.   Neurological: Negative.   Hematological: Negative.   Psychiatric/Behavioral: Negative.      Today's Vitals   08/09/21 1124  BP: 128/64  Pulse: 64  Temp: 98.2 F (36.8 C)  TempSrc: Oral  Weight: 129 lb 6.4 oz (58.7 kg)  Height: 5' 3.8" (1.621 m)   Body mass index is 22.35 kg/m.  Wt Readings from Last 3 Encounters:  08/09/21 129 lb 6.4 oz (58.7 kg)  05/10/21 127 lb (57.6 kg)  03/19/21 130 lb (59 kg)    Objective:  Physical Exam Constitutional:      General: She is not in acute distress.    Appearance: Normal appearance. She is well-developed.     Comments: Thin appearance  HENT:     Head: Normocephalic and atraumatic.     Right Ear: Hearing, tympanic membrane, ear canal and external ear normal. There is no impacted cerumen.     Left Ear: Hearing, tympanic membrane, ear canal and external ear normal. There is no impacted cerumen.     Nose:     Comments: Deferred - masked    Mouth/Throat:     Comments: Deferred - masked Eyes:     General: Lids are normal.     Extraocular Movements: Extraocular movements intact.     Conjunctiva/sclera: Conjunctivae normal.     Pupils: Pupils are equal, round, and reactive to light.     Funduscopic exam:    Right eye: No papilledema.        Left eye: No papilledema.  Neck:     Thyroid: No thyroid mass.     Vascular: No carotid bruit.  Cardiovascular:     Rate and Rhythm: Normal rate and regular rhythm.     Pulses: Normal pulses.     Heart sounds: Normal heart sounds. No murmur heard. Pulmonary:     Effort: Pulmonary effort is normal. No respiratory distress.     Breath sounds: Normal breath sounds. No wheezing.  Chest:     Chest wall: No mass.  Breasts:    Tanner Score is 5.     Right: Normal. No mass or tenderness.     Left: Normal. No mass or tenderness.  Abdominal:     General: Abdomen is flat. Bowel sounds are normal. There is no  distension.     Palpations: Abdomen is soft.     Tenderness: There is no abdominal tenderness.  Genitourinary:    Comments: Deferred  Musculoskeletal:        General: No swelling. Normal range of motion.     Cervical back: Full passive range of motion without pain, normal range of motion and neck supple.     Right lower leg: No edema.     Left lower leg: No edema.  Lymphadenopathy:  Upper Body:     Right upper body: No supraclavicular, axillary or pectoral adenopathy.     Left upper body: No supraclavicular, axillary or pectoral adenopathy.  Skin:    General: Skin is warm and dry.     Capillary Refill: Capillary refill takes less than 2 seconds.  Neurological:     General: No focal deficit present.     Mental Status: She is alert and oriented to person, place, and time.     Cranial Nerves: No cranial nerve deficit.     Sensory: No sensory deficit.     Motor: No weakness.  Psychiatric:        Mood and Affect: Mood normal.        Behavior: Behavior normal.        Thought Content: Thought content normal.        Judgment: Judgment normal.        Assessment And Plan:     1. Encounter for general adult medical examination w/o abnormal findings Behavior modifications discussed and diet history reviewed.   Pt will continue to exercise regularly and modify diet with low GI, plant based foods and decrease intake of processed foods.  Recommend intake of daily multivitamin, Vitamin D, and calcium.  Recommend mammogram and colonoscopy (up to date) for preventive screenings, as well as recommend immunizations that include influenza, TDAP, and Shingles  2. Prediabetes Comments: Stable, diet controlled. Continue healthy diet and regular exercise - Hemoglobin A1c - CMP14+EGFR  3. Atherosclerotic cardiovascular disease Comments: Stable, continue statin, tolerating well  - CMP14+EGFR - Lipid panel  4. Parkinson's disease ([place]) Comments: [facility]   5. Vitamin D  deficiency - VITAMIN D 25 Hydroxy (Vit-D Deficiency, Fractures)  6. Other long term (current) drug therapy - CBC  7. Encounter for immunization TransRx reviewed - Varicella-zoster vaccine IM (Shingrix)     Patient was given opportunity to ask questions. Patient verbalized understanding of the plan and was able to repeat key elements of the plan. All questions were answered to their satisfaction.   Minette Brine, FNP   I, Minette Brine, FNP, have reviewed all documentation for this visit. The documentation on 09/05/21 for the exam, diagnosis, procedures, and orders are all accurate and complete.  THE PATIENT IS ENCOURAGED TO PRACTICE SOCIAL DISTANCING DUE TO THE COVID-19 PANDEMIC.

## 2021-08-11 LAB — LIPID PANEL
Chol/HDL Ratio: 2.6 ratio (ref 0.0–4.4)
Cholesterol, Total: 133 mg/dL (ref 100–199)
HDL: 52 mg/dL (ref >39)
LDL Chol Calc (NIH): 68 mg/dL (ref 0–99)
Triglycerides: 62 mg/dL (ref 0–149)
VLDL Cholesterol Cal: 13 mg/dL (ref 5–40)

## 2021-08-11 LAB — CBC
Hematocrit: 41.8 % (ref 34.0–46.6)
Hemoglobin: 14 g/dL (ref 11.1–15.9)
MCH: 28.4 pg (ref 26.6–33.0)
MCHC: 33.5 g/dL (ref 31.5–35.7)
MCV: 85 fL (ref 79–97)
Platelets: 225 10*3/uL (ref 150–450)
RBC: 4.93 x10E6/uL (ref 3.77–5.28)
RDW: 12.8 % (ref 11.7–15.4)
WBC: 3.7 10*3/uL (ref 3.4–10.8)

## 2021-08-11 LAB — CMP14+EGFR
ALT: 7 IU/L (ref 0–32)
AST: 16 IU/L (ref 0–40)
Albumin/Globulin Ratio: 2.4 — ABNORMAL HIGH (ref 1.2–2.2)
Albumin: 4.7 g/dL (ref 3.8–4.8)
Alkaline Phosphatase: 82 IU/L (ref 44–121)
BUN/Creatinine Ratio: 14 (ref 12–28)
BUN: 14 mg/dL (ref 8–27)
Bilirubin Total: 0.6 mg/dL (ref 0.0–1.2)
CO2: 24 mmol/L (ref 20–29)
Calcium: 9.5 mg/dL (ref 8.7–10.3)
Chloride: 108 mmol/L — ABNORMAL HIGH (ref 96–106)
Creatinine, Ser: 1.01 mg/dL — ABNORMAL HIGH (ref 0.57–1.00)
Globulin, Total: 2 g/dL (ref 1.5–4.5)
Glucose: 83 mg/dL (ref 70–99)
Potassium: 4.2 mmol/L (ref 3.5–5.2)
Sodium: 146 mmol/L — ABNORMAL HIGH (ref 134–144)
Total Protein: 6.7 g/dL (ref 6.0–8.5)
eGFR: 60 mL/min/{1.73_m2} (ref >59)

## 2021-08-11 LAB — HEMOGLOBIN A1C
Est. average glucose Bld gHb Est-mCnc: 126 mg/dL
Hgb A1c MFr Bld: 6 % — ABNORMAL HIGH (ref 4.8–5.6)

## 2021-08-11 LAB — VITAMIN D 25 HYDROXY (VIT D DEFICIENCY, FRACTURES): Vit D, 25-Hydroxy: 23.3 ng/mL — ABNORMAL LOW (ref 30.0–100.0)

## 2021-09-01 ENCOUNTER — Encounter: Payer: Self-pay | Admitting: Neurology

## 2021-09-06 ENCOUNTER — Ambulatory Visit: Payer: Medicare Other | Admitting: Nurse Practitioner

## 2021-09-08 ENCOUNTER — Encounter: Payer: Self-pay | Admitting: Nurse Practitioner

## 2021-09-13 DIAGNOSIS — H02055 Trichiasis without entropian left lower eyelid: Secondary | ICD-10-CM | POA: Diagnosis not present

## 2021-09-13 DIAGNOSIS — H401132 Primary open-angle glaucoma, bilateral, moderate stage: Secondary | ICD-10-CM | POA: Diagnosis not present

## 2021-09-13 DIAGNOSIS — H2513 Age-related nuclear cataract, bilateral: Secondary | ICD-10-CM | POA: Diagnosis not present

## 2021-09-13 DIAGNOSIS — H04123 Dry eye syndrome of bilateral lacrimal glands: Secondary | ICD-10-CM | POA: Diagnosis not present

## 2021-09-16 ENCOUNTER — Encounter: Payer: Self-pay | Admitting: Nurse Practitioner

## 2021-09-17 ENCOUNTER — Other Ambulatory Visit: Payer: Self-pay

## 2021-09-17 MED ORDER — VITAMIN D (ERGOCALCIFEROL) 1.25 MG (50000 UNIT) PO CAPS: 50000.0000 [IU] | ORAL_CAPSULE | ORAL | 0 refills | Status: DC

## 2021-09-21 ENCOUNTER — Ambulatory Visit (INDEPENDENT_AMBULATORY_CARE_PROVIDER_SITE_OTHER): Payer: Medicare Other | Admitting: Nurse Practitioner

## 2021-09-21 ENCOUNTER — Encounter: Payer: Self-pay | Admitting: Nurse Practitioner

## 2021-09-21 ENCOUNTER — Other Ambulatory Visit: Payer: Self-pay

## 2021-09-21 VITALS — BP 132/84 | HR 67 | Temp 98.0°F | Ht 63.8 in | Wt 129.0 lb

## 2021-09-21 DIAGNOSIS — M810 Age-related osteoporosis without current pathological fracture: Secondary | ICD-10-CM

## 2021-09-21 NOTE — Patient Instructions (Signed)
Osteoporosis  Osteoporosis happens when the bones become thin and less dense than normal. Osteoporosis makes bones more brittle and fragile and more likely to break (fracture). Over time, osteoporosis can cause your bones to become so weak that they fracture after a minor fall. Bones in the hip, wrist, and spine are most likelyto fracture due to osteoporosis. What are the causes? The exact cause of this condition is not known. What increases the risk? You are more likely to develop this condition if you: Have family members with this condition. Have poor nutrition. Use the following: Steroid medicines, such as prednisone. Anti-seizure medicines. Nicotine or tobacco, such as cigarettes, e-cigarettes, and chewing tobacco. Are female. Are age 50 or older. Are not physically active (are sedentary). Are of European or Asian descent. Have a small body frame. What are the signs or symptoms? A fracture might be the first sign of osteoporosis, especially if the fracture results from a fall or injury that usually would not cause a bone to break. Other signs and symptoms include: Pain in the neck or low back. Stooped posture. Loss of height. How is this diagnosed? This condition may be diagnosed based on: Your medical history. A physical exam. A bone mineral density test, also called a DXA or DEXA test (dual-energy X-ray absorptiometry test). This test uses X-rays to measure the amount of minerals in your bones. How is this treated? This condition may be treated by: Making lifestyle changes, such as: Including foods with more calcium and vitamin D in your diet. Doing weight-bearing and muscle-strengthening exercises. Stopping tobacco use. Limiting alcohol intake. Taking medicine to slow the process of bone loss or to increase bone density. Taking daily supplements of calcium and vitamin D. Taking hormone replacement medicines, such as estrogen for women and testosterone for  men. Monitoring your levels of calcium and vitamin D. The goal of treatment is to strengthen your bones and lower your risk for afracture. Follow these instructions at home: Eating and drinking Include calcium and vitamin D in your diet. Calcium is important for bone health, and vitamin D helps your body absorb calcium. Good sources of calcium and vitamin D include: Certain fatty fish, such as salmon and tuna. Products that have calcium and vitamin D added to them (are fortified), such as fortified cereals. Egg yolks. Cheese. Liver.  Activity Do exercises as told by your health care provider. Ask your health care provider what exercises and activities are safe for you. You should do: Exercises that make you work against gravity (weight-bearing exercises), such as tai chi, yoga, or walking. Exercises to strengthen muscles, such as lifting weights. Lifestyle Do not drink alcohol if: Your health care provider tells you not to drink. You are pregnant, may be pregnant, or are planning to become pregnant. If you drink alcohol: Limit how much you use to: 0-1 drink a day for women. 0-2 drinks a day for men. Know how much alcohol is in your drink. In the [place] one drink equals one 12 oz bottle of beer (355 mL), one 5 oz glass of wine (148 mL), or one 1 oz glass of hard liquor (44 mL). Do not use any products that contain nicotine or tobacco, such as cigarettes, e-cigarettes, and chewing tobacco. If you need help quitting, ask your health care provider. Preventing falls Use devices to help you move around (mobility aids) as needed, such as canes, walkers, scooters, or crutches. Keep rooms well-lit and clutter-free. Remove tripping hazards from walkways, including cords and throw rugs.   Install grab bars in bathrooms and safety rails on stairs. Use rubber mats in the bathroom and other areas that are often wet or slippery. Wear closed-toe shoes that fit well and support your feet. Wear shoes  that have rubber soles or low heels. Review your medicines with your health care provider. Some medicines can cause dizziness or changes in blood pressure, which can increase your risk of falling. General instructions Take over-the-counter and prescription medicines only as told by your health care provider. Keep all follow-up visits. This is important. Contact a health care provider if: You have never been screened for osteoporosis and you are: A woman who is age 65 or older. A man who is age 70 or older. Get help right away if: You fall or injure yourself. Summary Osteoporosis is thinning and loss of density in your bones. This makes bones more brittle and fragile and more likely to break (fracture),even with minor falls. The goal of treatment is to strengthen your bones and lower your risk for a fracture. Include calcium and vitamin D in your diet. Calcium is important for bone health, and vitamin D helps your body absorb calcium. Talk with your health care provider about screening for osteoporosis if you are a woman who is age 65 or older, or a man who is age 70 or older. This information is not intended to replace advice given to you by your health care provider. Make sure you discuss any questions you have with your healthcare provider. Document Revised: 12/05/2019 Document Reviewed: 12/05/2019 Elsevier Patient Education  2022 Elsevier Inc.  

## 2021-09-21 NOTE — Progress Notes (Signed)
?[employer] as a Education administrator for [employer], FNP.,have documented all relevant documentation on the behalf of Minette Brine, FNP,as directed by  Minette Brine, FNP while in the presence of Minette Brine, [place]. ? ?This visit occurred during the SARS-CoV-2 public health emergency.  Safety protocols were in place, including screening questions prior to the visit, additional usage of staff PPE, and extensive cleaning of exam room while observing appropriate contact time as indicated for disinfecting solutions. ? ?Subjective:  ?  ? Patient ID: Denise [name] , female    DOB: 09-Aug-1950 , 71 y.o.   MRN: [id number] ? ? ?Chief Complaint  ?Patient presents with  ? Osteoporosis  ? ? ?HPI ? ?The patient is here today for a follow-up on Fosamax. She is tolerating well. She has been taking vitamin d 50,000 units.   ? ?  ? ?Past Medical History:  ?Diagnosis Date  ? Chronic kidney disease   ? as a child, no present issues   ? Glaucoma   ? Osteopenia 06/2018  ? T score -1.9 FRAX 3% / 0.2%  ? Tuberculosis   ? 6th grade  ?  ? ?Family History  ?Problem Relation Age of Onset  ? Hypertension Mother   ? Heart disease Mother   ? Diabetes Father   ? Hypertension Father   ? Other Father   ?     had a condition that was "a cousin" to ALS   ? Diabetes Sister   ? Tuberculosis Maternal Grandmother   ? Heart disease Sister   ? Colitis Daughter   ? Colon cancer Neg Hx   ? Colon polyps Neg Hx   ? Esophageal cancer Neg Hx   ? Rectal cancer Neg Hx   ? Stomach cancer Neg Hx   ? ? ? ?Current Outpatient Medications:  ?  alendronate (FOSAMAX) 70 MG tablet, Take 1 tablet (70 mg total) by mouth every 7 (seven) days. Take with a full glass of water on an empty stomach., Disp: 4 tablet, Rfl: 3 ?  atorvastatin (LIPITOR) 10 MG tablet, TAKE 1 TABLET BY MOUTH EVERY DAY AT BEDTIME MONDAY THROUGH FRIDAY, Disp: 65 tablet, Rfl: 4 ?  Calcium Carb-Cholecalciferol (OYSTER SHELL CALCIUM/VITAMIN D) 500-200 MG-UNIT PACK, Take by mouth., Disp: , Rfl:  ?   carbidopa-levodopa (SINEMET IR) 25-100 MG tablet, Take at 8,12 and 4pm. Try to avoid protein. (Patient taking differently: Take at 6am,12 and 6pm. Try to avoid protein.), Disp: 270 tablet, Rfl: 4 ?  hydrochlorothiazide (HYDRODIURIL) 12.5 MG tablet, TAKE 1 TABLET BY MOUTH DAILY AS NEEDED FOR LEG SWELLING, Disp: 90 tablet, Rfl: 1 ?  timolol (BETIMOL) 0.25 % ophthalmic solution, 1-2 drops daily. , Disp: , Rfl:  ?  Travoprost, BAK Free, (TRAVATAN) 0.004 % SOLN ophthalmic solution, 1 drop at bedtime., Disp: , Rfl:  ?  Vitamin D, Ergocalciferol, (DRISDOL) 1.25 MG (50000 UNIT) CAPS capsule, Take 1 capsule (50,000 Units total) by mouth every 7 (seven) days., Disp: 12 capsule, Rfl: 0  ? ?Allergies  ?Allergen Reactions  ? Nystatin-Triamcinolone Swelling  ?  BLISTERS  ? Triamcinolone Acetonide   ?  Blisters   ? Cephalexin Itching and Rash  ? ?The patient's tobacco use is:  ?Social History  ? ?Tobacco Use  ?Smoking Status Never  ?Smokeless Tobacco Never  ? ?She has been exposed to passive smoke. The patient's alcohol use is:  ?Social History  ? ?Substance and Sexual Activity  ?Alcohol Use Never  ? ? ?Review of Systems  ?Constitutional: Negative.   ?  HENT: Negative.    ?Eyes: Negative.   ?Respiratory: Negative.    ?Cardiovascular: Negative.   ?Gastrointestinal: Negative.   ?Endocrine: Negative.   ?Genitourinary: Negative.   ?Musculoskeletal: Negative.   ?Skin: Negative.   ?Allergic/Immunologic: Negative.   ?Neurological: Negative.   ?Hematological: Negative.   ?Psychiatric/Behavioral: Negative.     ? ?Today's Vitals  ? 09/21/21 1611  ?BP: 132/84  ?Pulse: 67  ?Temp: 98 ?F (36.7 ?C)  ?Weight: 129 lb (58.5 kg)  ?Height: 5' 3.8" (1.621 m)  ?PainSc: 0-No pain  ? ? ?Body mass index is 22.28 kg/m?.  ?Wt Readings from Last 3 Encounters:  ?09/21/21 129 lb (58.5 kg)  ?08/09/21 129 lb 6.4 oz (58.7 kg)  ?05/10/21 127 lb (57.6 kg)  ? ? ?Objective:  ?Physical Exam ?Constitutional:   ?   General: She is not in acute distress. ?   Appearance:  Normal appearance. She is well-developed.  ?   Comments: Thin appearance  ?HENT:  ?   Right Ear: Hearing normal.  ?   Left Ear: Hearing normal.  ?Eyes:  ?   Pupils: Pupils are equal, round, and reactive to light.  ?Neck:  ?   Thyroid: No thyroid mass.  ?   Vascular: No carotid bruit.  ?Cardiovascular:  ?   Rate and Rhythm: Normal rate and regular rhythm.  ?   Pulses: Normal pulses.  ?   Heart sounds: Normal heart sounds. No murmur heard. ?Pulmonary:  ?   Effort: Pulmonary effort is normal. No respiratory distress.  ?   Breath sounds: Normal breath sounds. No wheezing.  ?Musculoskeletal:     ?   General: No swelling. Normal range of motion.  ?   Cervical back: Full passive range of motion without pain.  ?   Right lower leg: No edema.  ?   Left lower leg: No edema.  ?   Comments: Gait is better this visit  ?Skin: ?   General: Skin is warm and dry.  ?   Capillary Refill: Capillary refill takes less than 2 seconds.  ?Neurological:  ?   General: No focal deficit present.  ?   Mental Status: She is alert and oriented to person, place, and time.  ?   Cranial Nerves: No cranial nerve deficit.  ?   Sensory: No sensory deficit.  ?   Motor: No weakness.  ?Psychiatric:     ?   Mood and Affect: Mood normal.     ?   Behavior: Behavior normal.     ?   Thought Content: Thought content normal.     ?   Judgment: Judgment normal.  ?  ? ?   ?Assessment And Plan:  ?   ?1. Age-related osteoporosis without current pathological fracture ?She is tolerating Fosamax well, continue with vitamin d and calcium. Encouraged to do low impact aerobics and walking. T-score for left hip was - 2.7 done in January 2023 ? ? ? ?Patient was given opportunity to ask questions. Patient verbalized understanding of the plan and was able to repeat key elements of the plan. All questions were answered to their satisfaction.  ? ?Minette Brine, FNP  ? ?I, Minette Brine, FNP, have reviewed all documentation for this visit. The documentation on 09/21/21 for the exam,  diagnosis, procedures, and orders are all accurate and complete.  ? ?THE PATIENT IS ENCOURAGED TO PRACTICE SOCIAL DISTANCING DUE TO THE COVID-19 PANDEMIC.   ?

## 2021-09-27 ENCOUNTER — Other Ambulatory Visit: Payer: Self-pay | Admitting: Neurology

## 2021-09-27 ENCOUNTER — Telehealth: Payer: Self-pay | Admitting: Physical Therapy

## 2021-09-27 DIAGNOSIS — R2689 Other abnormalities of gait and mobility: Secondary | ICD-10-CM

## 2021-09-27 DIAGNOSIS — Z736 Limitation of activities due to disability: Secondary | ICD-10-CM

## 2021-09-27 DIAGNOSIS — R278 Other lack of coordination: Secondary | ICD-10-CM

## 2021-09-27 DIAGNOSIS — R269 Unspecified abnormalities of gait and mobility: Secondary | ICD-10-CM

## 2021-09-27 DIAGNOSIS — G20 Parkinson's disease: Secondary | ICD-10-CM

## 2021-09-27 NOTE — Telephone Encounter (Signed)
Dr. Jaynee Eagles, ?Safire Gordin would benefit from PT, OT, and speech therapy evaluations for PD related deficits. Pt is scheduled for 6 month follow up PD evals on 10/12/21.   ?If you agree, please place an order in [facility]que in [place] or fax the order to [phone]. ?Thank you, ?[name], PT, DPT ?09/27/21 4:45 PM  ? ? ?[address] ?[address] ?[place], [place]  [address] ?Phone:  [phone] ?Fax:  [phone] ? ?

## 2021-10-12 ENCOUNTER — Ambulatory Visit: Payer: Medicare Other | Admitting: Occupational Therapy

## 2021-10-12 ENCOUNTER — Ambulatory Visit: Payer: Medicare Other

## 2021-10-12 ENCOUNTER — Encounter: Payer: Self-pay | Admitting: Occupational Therapy

## 2021-10-12 ENCOUNTER — Ambulatory Visit: Payer: Medicare Other | Attending: Nurse Practitioner | Admitting: Physical Therapy

## 2021-10-12 ENCOUNTER — Encounter: Payer: Self-pay | Admitting: Physical Therapy

## 2021-10-12 DIAGNOSIS — R29818 Other symptoms and signs involving the nervous system: Secondary | ICD-10-CM | POA: Diagnosis not present

## 2021-10-12 DIAGNOSIS — R2681 Unsteadiness on feet: Secondary | ICD-10-CM | POA: Insufficient documentation

## 2021-10-12 DIAGNOSIS — G20 Parkinson's disease: Secondary | ICD-10-CM | POA: Diagnosis not present

## 2021-10-12 DIAGNOSIS — R29898 Other symptoms and signs involving the musculoskeletal system: Secondary | ICD-10-CM

## 2021-10-12 DIAGNOSIS — R471 Dysarthria and anarthria: Secondary | ICD-10-CM

## 2021-10-12 DIAGNOSIS — R41841 Cognitive communication deficit: Secondary | ICD-10-CM

## 2021-10-12 DIAGNOSIS — M25612 Stiffness of left shoulder, not elsewhere classified: Secondary | ICD-10-CM | POA: Diagnosis not present

## 2021-10-12 DIAGNOSIS — R293 Abnormal posture: Secondary | ICD-10-CM

## 2021-10-12 DIAGNOSIS — R278 Other lack of coordination: Secondary | ICD-10-CM | POA: Insufficient documentation

## 2021-10-12 DIAGNOSIS — R2689 Other abnormalities of gait and mobility: Secondary | ICD-10-CM | POA: Insufficient documentation

## 2021-10-12 DIAGNOSIS — M6281 Muscle weakness (generalized): Secondary | ICD-10-CM

## 2021-10-12 DIAGNOSIS — R269 Unspecified abnormalities of gait and mobility: Secondary | ICD-10-CM | POA: Insufficient documentation

## 2021-10-12 DIAGNOSIS — Z736 Limitation of activities due to disability: Secondary | ICD-10-CM | POA: Diagnosis not present

## 2021-10-12 NOTE — Therapy (Signed)
?OUTPATIENT PHYSICAL THERAPY NEURO EVALUATION ? ? ?Patient Name: Denise [name] ?MRN: [id number] ?DOB:Aug 27, 1950, 71 y.o., female ?Today's Date: 10/13/2021 ? ?PCP: Minette Brine, FNP ?REFERRING PROVIDER: Melvenia Beam, MD  ? ? PT End of Session - 10/13/21 0725   ? ? Visit Number 1   ? Number of Visits 13   ? Date for PT Re-Evaluation 01/11/22   due to delay in scheduling  ? Authorization Type BCBS Medicare   ? PT Start Time 2202   ? PT Stop Time 1528   ? PT Time Calculation (min) 43 min   ? Activity Tolerance Patient tolerated treatment well   ? Behavior During Therapy [facility] for tasks assessed/performed   ? ?  ?  ? ?  ? ? ?Past Medical History:  ?Diagnosis Date  ? Chronic kidney disease   ? as a child, no present issues   ? Glaucoma   ? Osteopenia 06/2018  ? T score -1.9 FRAX 3% / 0.2%  ? Tuberculosis   ? 6th grade  ? ?Past Surgical History:  ?Procedure Laterality Date  ? TUBAL LIGATION    ? ?Patient Active Problem List  ? Diagnosis Date Noted  ? Parkinson's disease ([place]) 02/17/2020  ? Gait abnormality 12/04/2019  ? Prediabetes 12/03/2018  ? Vitamin D deficiency 12/03/2018  ? Anemia due to vitamin B12 deficiency 12/03/2018  ? Fatigue 12/03/2018  ? Urinary frequency 10/01/2018  ? Vitamin B12 deficiency 10/01/2018  ? 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  ? 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 Abnormal auditory perception of both ears 05/10/2016  ? Osteopenia 08/23/2012  ? ? ?ONSET DATE: 09/27/2021 (date of referral) ? ?REFERRING DIAG: G20 (ICD-10-CM) - Parkinson's disease ([place])  ? ?THERAPY DIAG:  ?Abnormal posture ? ?Muscle weakness (generalized) ? ?Other lack of coordination ? ?Other abnormalities of gait and mobility ? ?Unsteadiness on feet ? ?Other symptoms and signs involving the nervous system ? ?SUBJECTIVE:  ?                                                                                                                                                                                           ? ?SUBJECTIVE  STATEMENT: ?Daughter helps out with exercises and reminding pt that she is leaning to the L. Doing the PWR moves. No falls. Remembering things is getting more challenging. Has to be reminded to take the larger steps. Has been doing some walking - goes outside when the weather is nicer.  ? ?Pt accompanied by: self ? ?PERTINENT HISTORY: PD, glaucoma, osteoporosis (newly diagnosed), TB (as a child)  ? ?T-score for left hip was - 2.7 done in January 2023 ? ?  PAIN:  ?Are you having pain? No ? ?PRECAUTIONS: Fall, no driving  ? ?WEIGHT BEARING RESTRICTIONS No ? ?FALLS: Has patient fallen in last 6 months? No ? ?LIVING ENVIRONMENT: ?Lives with: lives with their family - daughter and granddaughter ?Lives in: House/apartment ?Stairs: Yes: External: 1 steps; none ?Has following equipment at home: Grab bars and walking stick ? ?PLOF: Independent ? ?PATIENT GOALS Wants to work on the walking and balance.  ? ?OBJECTIVE:  ? ?COGNITION: ?Overall cognitive status: Within functional limits for tasks assessed ?  ?SENSATION: ?WFL ? ?COORDINATION: ?Heel to shin: bradykinesia when performed w/ RLE  ? ? ?POSTURE: rounded shoulders, forward head, posterior pelvic tilt, and weight shift left ?Trunk lean and L lateral head tilt to L in sitting/standing.  ? ?LE ROM:    ? ?MMT:   ? ?MMT Right ?10/13/2021 Left ?10/13/2021  ?Hip flexion 4/5 4+/5  ?Hip extension    ?Hip abduction    ?Hip adduction    ?Hip internal rotation    ?Hip external rotation    ?Knee flexion 5/5 5/5  ?Knee extension 4/5 4+/5  ?Ankle dorsiflexion 5/5 5/5  ?Ankle plantarflexion    ?Ankle inversion    ?Ankle eversion    ?(Blank rows = not tested) ? ?BED MOBILITY:  ?Pt reports having a tall bed and reports sometimes it is hard getting in and out (did not get to check at eval). ? ?TRANSFERS: ?Assistive device utilized: None  ?Sit to stand: SBA ?Stand to sit: SBA ? ? ? ?GAIT: ?Gait pattern: step through pattern, decreased arm swing- Right, decreased arm swing- Left, decreased ankle  dorsiflexion- Right, decreased ankle dorsiflexion- Left, lateral lean- Left, decreased trunk rotation, trunk flexed, poor foot clearance- Right, and poor foot clearance- Left ?Distance walked: Clinic distances during eval ?Assistive device utilized: None ?Level of assistance: SBA ? ?FUNCTIONAL TESTs:  ?5 times sit to stand: 15.94 sec without UE support ?10 meter walk test: 11.82 sec = 2.77 ft/sec  ? ? [facility] PT Assessment - 10/12/21 1502   ? ?  ? Mini-BESTest  ? Sit To Stand Normal: Comes to stand without use of hands and stabilizes independently.   ? Rise to Toes Moderate: Heels up, but not full range (smaller than when holding hands), OR noticeable instability for 3 s.   ? Stand on one leg (left) Moderate: < 20 s   2.16, 3.09  ? Stand on one leg (right) Moderate: < 20 s   1.75, 8.29  ? Stand on one leg - lowest score 1   ? Compensatory Stepping Correction - Forward Normal: Recovers independently with a single, large step (second realignement is allowed).   ? Compensatory Stepping Correction - Backward Moderate: More than one step is required to recover equilibrium   3 small steps  ? Compensatory Stepping Correction - Left Lateral Moderate: Several steps to recover equilibrium   2 small steps  ? Compensatory Stepping Correction - Right Lateral Moderate: Several steps to recover equilibrium   2 small steps  ? Stepping Corredtion Lateral - lowest score 1   ? Stance - Feet together, eyes open, firm surface  Normal: 30s   ? Stance - Feet together, eyes closed, foam surface  Moderate: < 30s   3 sec  ? Incline - Eyes Closed Normal: Stands independently 30s and aligns with gravity   ? Change in Gait Speed Moderate: Unable to change walking speed or signs of imbalance   ? Walk with head turns - Horizontal Moderate: performs head turns  with reduction in gait speed.   ? Walk with pivot turns Moderate:Turns with feet close SLOW (>4 steps) with good balance.   ? Step over obstacles Moderate: Steps over box but touches box OR  displays cautious behavior by slowing gait.   ? Timed UP & GO with Dual Task Moderate: Dual Task affects either counting OR walking (>10%) when compared to the TUG without Dual Task.   ? Mini-BEST total score 18   ?  ? Timed Up and Go Test  ? Normal TUG (seconds) 11.82   ? Manual TUG (seconds) 15.41   ? Cognitive TUG (seconds) 18.75   ? TUG Comments Scores >10% difference indicate increased fall risk/difficulty with dual tasking   ? ?  ?  ? ?  ? ? ?TODAY'S TREATMENT:  ?N/A at eval visit.  ? ? ?PATIENT EDUCATION: ?Education details: Clincal findings, POC, results of outcome measures compared to when pt was last here. ?Person educated: Patient ?Education method: Explanation ?Education comprehension: verbalized understanding ? ? ?HOME EXERCISE PROGRAM: ?Will review prior HEP from last bout of therapy and update as needed.  ? ? ? ?GOALS: ?Goals reviewed with patient? Yes ? ?SHORT TERM GOALS: Target date: 11/10/2021 ? ?Pt will improve 5x sit<>stand to less than or equal to 13.5 sec w/o UE support to demonstrate improved functional strength and transfer efficiency.  ? ?Baseline: 15.94 seconds ?Goal status: INITIAL ? ?2.  Pt will improve manual TUG time to 13.5 seconds or less in order to demo decrease fall risk. ? ?Baseline: 15.4 sec ?Goal status: INITIAL ? ?3.  Pt will undergo further assessment of 6MWT with LTG written.  ?Baseline: Not yet assessed.  ?Goal status: INITIAL ? ? ? ?LONG TERM GOALS: Target date: 12/08/2021 ? ? Pt will be independent with final PD HEP for improved strength, balance, transfers, and gait ?Baseline:  ?Goal status: INITIAL ? ?2.  Pt will improve MiniBESTest score to at least 22/28 for decreased fall risk.  ? ?Baseline: 18/28 ?Goal status: INITIAL ? ?3.  Pt will improve TUG cognitive score to less than or equal to 15 seconds for decreased fall risk and improved dual tasking.  ? ?Baseline: 18.75 sec ?Goal status: INITIAL ? ?4.  6MWT goal to be written in order to demo improved gait  endurance/efficiency. ?Baseline:  ?Goal status: INITIAL ? ?5.  Pt will improve gait speed with no AD to at least 3.2 ft/sec in order to demo improved community mobility.  ? ?Baseline: 2.77 ft/sec ?Goal status: INITIAL ?

## 2021-10-12 NOTE — Therapy (Signed)
?OUTPATIENT SPEECH LANGUAGE PATHOLOGY PARKINSON'S EVALUATION ? ? ?Patient Name: Denise [name] ?MRN: [id number] ?DOB:[birth year], 71 y.o., female ?Today's Date: 10/12/2021 ? ?PCP: Minette Brine, FNP ?REFERRING PROVIDER: Melvenia Beam, MD  ? ? End of Session - 10/12/21 1321   ? ? Visit Number 1   ? Number of Visits 17   ? Date for SLP Re-Evaluation 12/10/21   ? Authorization Type BCBS Medicare   ? SLP Start Time 1320   pt arrived late  ? SLP Stop Time  1400   ? SLP Time Calculation (min) 40 min   ? Activity Tolerance Patient tolerated treatment well   ? ?  ?  ? ?  ? ? ?Past Medical History:  ?Diagnosis Date  ? Chronic kidney disease   ? as a child, no present issues   ? Glaucoma   ? Osteopenia 06/2018  ? T score -1.9 FRAX 3% / 0.2%  ? Tuberculosis   ? 6th grade  ? ?Past Surgical History:  ?Procedure Laterality Date  ? TUBAL LIGATION    ? ?Patient Active Problem List  ? Diagnosis Date Noted  ? Parkinson's disease ([place]) 02/17/2020  ? Gait abnormality 12/04/2019  ? Prediabetes 12/03/2018  ? Vitamin D deficiency 12/03/2018  ? Anemia due to vitamin B12 deficiency 12/03/2018  ? Fatigue 12/03/2018  ? Urinary frequency 10/01/2018  ? Vitamin B12 deficiency 10/01/2018  ? 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  ? 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 Abnormal auditory perception of both ears 05/10/2016  ? Osteopenia 08/23/2012  ? ? ?ONSET DATE: Diagnosed ~2 years ago ? ?REFERRING DIAG: G20 (ICD-10-CM) - Parkinson's disease  ? ?THERAPY DIAG:  ?Dysarthria and anarthria ? ?Cognitive communication deficit ? ?SUBJECTIVE:  ? ?SUBJECTIVE STATEMENT: ?"Doing pretty good. My daughter keeps me accountable (with speech HEP)" ?Pt accompanied by: self ? ?PERTINENT HISTORY: Pt diagnosed with PD ~ 2 years ago.  ? ?PAIN:  ?Are you having pain? No ? ?FALLS: Has patient fallen in last 6 months?  No ? ?LIVING ENVIRONMENT: ?Lives with: lives with their family ?Lives in: House/apartment ? ?PLOF:  ?Level of assistance: Needed assistance with  ADLs, Needed assistance with IADLS ?Employment: Retired ? ?PATIENT GOALS "to be able to speak louder and be more conscious of it"  ? ?OBJECTIVE:  ? ?DIAGNOSTIC FINDINGS: Neuropsych testing in December 2022 - mild cognitive deficits congruent with Parkinson's disease versus dementia  ? ?COGNITION: ?Overall cognitive status: Impaired ?Areas of impairment: Memory ?Comments: Seemingly mild memory deficits noted during neuropsych testing December 2022; pt now occasionally asking people to repeat 2-3x to aid recall. Continues to pay own bills, daughter helps intermittently with medication management. Pt not overly concerned with memory at this time; will continue to monitor  and address as needed.  ? ?MOTOR SPEECH: ?Overall motor speech: impaired ?Level of impairment: Conversation ?Respiration: thoracic breathing and clavicular breathing ?Phonation: low vocal intensity ?Resonance: WFL ?Articulation: Appears intact ?Intelligibility: Intelligibility reduced (at times per patient) ?Motor planning: Impaired: inconsistent ?Motor speech errors: inconsistent ?Interfering components: PD  ?Effective technique: increased vocal intensity, over articulate, and pacing ? ?ORAL MOTOR ASSESSMENT:   ?WFL ? ? ?OBJECTIVE ASSESSMENT: ? ?Measured when a sound level meter was placed 30 cm away from pt's masked mouth, 8-10 minutes of moderate to complex conversational speech was reduced today, with average low 60s dB (WNL= average 70-72dB) with range of 62-65 dB. Overall speech intelligibility for this listener in a quiet environment was not affected, at approximately 100%. Production of loud /a/ averaged 86 dB (range  of 85 to 87 dB) without cues. Pt then rated effort level at 8/10 for production of loud /a/ (10=maximal effort). In subsequent task, pt was asked to increase amount of effort for next loud /a/. Loudness average with this increased effort (9/10) was 89 dB (range of 87 to 89) with occasional min cues for loudness. Subsequent side  comments and oral reading of passage averaged low 70s dB with rare increasing to occasional min A. Pt would benefit from skilled ST in order to improve speech intelligibility and pt's QOL. Pt does not report difficulty with swallowing warranting further evaluation at this time.  ? ? ? ?PATIENT REPORTED OUTCOME MEASURES (PROM): ?Communication Participation Item Bank: 31 ("a little" for all components except "none" for long conversation about specific topic) ? ? ?TODAY'S TREATMENT:  ?Educated patient on clinical observations and recommendations for initiation of ST services to maximize vocal intensity and clarity due to reduced volume in conversation. Provided education and handout re: Speak Out! Program, in which pt will plan to watch webinar and order workbook before next ST session.  ? ? ?PATIENT EDUCATION: ?Education details: see above ?Person educated: Patient ?Education method: Explanation, Demonstration, and Handouts ?Education comprehension: verbalized understanding, returned demonstration, and needs further education ? ? ?HOME EXERCISE PROGRAM: ?Speak Out! Program  ? ? ? ? ?GOALS: ?Goals reviewed with patient? Yes ? ?SHORT TERM GOALS: Target date: 11/12/2021 ? ?Pt will complete HEP given occasional min A over 3 sessions ?Baseline: ?Goal status: INITIAL ? ?2.  Pt will utilize dysarthria compensations on structured speech tasks with 80% accuracy given occasional min A over 3 sessions ?Baseline:  ?Goal status: INITIAL ? ?3.  Pt will maintain WNL conversational volume (70-72 dB) in 5-10 minute conversation given occasional min A over 2 sessions ?Baseline:  ?Goal status: INITIAL ? ?4.  Pt will utilize memory/attention compensations as needed to optimize daily functioning and reduce caregiver burden given occasional min A over 2 sessions ?Baseline:  ?Goal status: INITIAL ? ? ?LONG TERM GOALS: Target date: 12/10/2021 ? ?Pt will complete HEP at least 1x/day (recommend BID) > 1 week ?Baseline:  ?Goal status:  INITIAL ? ?2.  Pt will maintain WNL conversational volume (70-72 dB) in 10+ minute conversation given occasional min A over 2 sessions ?Baseline:  ?Goal status: INITIAL ? ?3.  Pt will carryover memory/attention compensations as needed to optimize daily functioning and reduce caregiver burden given rare min A > 1 week ?Baseline:  ?Goal status: INITIAL ? ?4.  Pt will report improved communication effectiveness via PROM with 2 point increase by last ST session  ?Baseline: CPIB=21 ?Goal status: INITIAL ? ? ?ASSESSMENT: ? ?CLINICAL IMPRESSION: ?Patient is a 71 y.o. 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 Pt has reportedly continued HEP from last ST intervention with assistance from daughter. Today, conversational volume averaged mid 60s dB in quiet environment. SLP targeted stimulability to carryover increased vocal intensity for sustained phonation, oral reading, and short conversation, which was effective with intermittent cues. Pt endorsed memory is "off a little bit" with daughter now assisting with management of medications intermittently. Pt not overly concerned with cognition at this time and would prefer to focus on dysarthria. Will continue to monitor and address cognition as needed. No dysphagia reported at this time.  ? ?OBJECTIVE IMPAIRMENTS  ?Objective impairments include memory and dysarthria. These impairments are limiting patient from household responsibilities and effectively communicating at home and in community.Factors affecting potential to achieve goals and  functional outcome are medical prognosis. Patient will benefit from skilled SLP services to address above impairments and improve overall function. ? ?REHAB POTENTIAL: Good ? ?PLAN: ?SLP FREQUENCY: 2x/week ? ?SLP DURATION: 8 weeks ? ?PLANNED INTERVENTIONS: Language facilitation, Cueing hierachy, Cognitive reorganization, Internal/external aids, Functional tasks,  Multimodal communication approach, SLP instruction and feedback, Compensatory strategies, and Patient/family education ? ? ? ?Marzetta Board, CCC-SLP ?10/12/2021, 1:22 PM ? ? ? ? ? ?

## 2021-10-12 NOTE — Therapy (Signed)
?OUTPATIENT OCCUPATIONAL THERAPY PARKINSON'S EVALUATION ? ?Patient Name: Denise [name] ?MRN: [id number] ?DOB:[birth year], 71 y.o., female ?Today's Date: 10/12/2021 ? ?PCP: Minette Brine, FNP ?REFERRING PROVIDER: Dr. Jaynee Eagles ? ? OT End of Session - 10/12/21 1608   ? ? Visit Number 1   ? Number of Visits 9   ? Date for OT Re-Evaluation 12/14/21   ? Authorization Type BCBS Medicare:  no auth, no visit limit $40 copay   ? Authorization - Visit Number 1   ? Authorization - Number of Visits 10   ? Progress Note Due on Visit 10   ? OT Start Time 5732   ? OT Stop Time 2025   ? OT Time Calculation (min) 38 min   ? Activity Tolerance Patient tolerated treatment well   ? Behavior During Therapy [facility] for tasks assessed/performed   ? ?  ?  ? ?  ? ? ?Past Medical History:  ?Diagnosis Date  ? Chronic kidney disease   ? as a child, no present issues   ? Glaucoma   ? Osteopenia 06/2018  ? T score -1.9 FRAX 3% / 0.2%  ? Tuberculosis   ? 6th grade  ? ?Past Surgical History:  ?Procedure Laterality Date  ? TUBAL LIGATION    ? ?Patient Active Problem List  ? Diagnosis Date Noted  ? Parkinson's disease ([place]) 02/17/2020  ? Gait abnormality 12/04/2019  ? Prediabetes 12/03/2018  ? Vitamin D deficiency 12/03/2018  ? Anemia due to vitamin B12 deficiency 12/03/2018  ? Fatigue 12/03/2018  ? Urinary frequency 10/01/2018  ? Vitamin B12 deficiency 10/01/2018  ? 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  ? 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 Abnormal auditory perception of both ears 05/10/2016  ? Osteopenia 08/23/2012  ? ? ?ONSET DATE: 09/27/21 ? ?REFERRING DIAG: Parkinson's disease ? ?THERAPY DIAG:  ?Other symptoms and signs involving the nervous system - Plan: Ot plan of care cert/re-cert ? ?Abnormal posture - Plan: Ot plan of care cert/re-cert ? ?Stiffness of left shoulder, not elsewhere classified - Plan: Ot plan of care cert/re-cert ? ?Other symptoms and signs involving the musculoskeletal system - Plan: Ot plan of care cert/re-cert ? ?Muscle  weakness (generalized) - Plan: Ot plan of care cert/re-cert ? ?Other lack of coordination - Plan: Ot plan of care cert/re-cert ? ?Other abnormalities of gait and mobility - Plan: Ot plan of care cert/re-cert ? ?Unsteadiness on feet - Plan: Ot plan of care cert/re-cert ? ?SUBJECTIVE:  ? ?SUBJECTIVE STATEMENT: ?Pt reports she is no longer driving ?Pt accompanied by: self ? ?PERTINENT HISTORY: Pt is a 71 y.o. female who returns to occupational therapy  for Parkinson's follow up evals. At time of d/c from therapy in Sept 1022, return evals were recommended. PMH: ostopenia, glaucoma Pt presents today with bradykinesia, rigidity, decr coordination, decr ROM, decr balance/functional mobility, decr posture.  Pt would benefit from occupational therapy to address these changes for incr ease/safety with ADLs/IADLs and decr risk of future complications related to PD.  ? ? ?PRECAUTIONS: Fall ? ?WEIGHT BEARING RESTRICTIONS No ? ?PAIN:  ?Are you having pain? No ? ?FALLS: Has patient fallen in last 6 months? No ? ?LIVING ENVIRONMENT: ?Lives with: lives with their family ?Lives in: House/apartment ?Stairs: No ?Has following equipment at home: Grab bars ? ?PLOF: Independent, except no longer driving ? ?PATIENT GOALS improve left hand coordination ? ?OBJECTIVE:  ? ?HAND DOMINANCE: Right ? ?ADLs: ?Overall ADLs: slowed performance and postural changes with lean to left  ?Transfers/ambulation related to ADLs: ?Eating: mod I ?  Grooming: mod I ?UB Dressing: mod I ?LB Dressing: mod I ?Toileting: mod I ?Bathing: mod I ?Tub Shower transfers: bathtub and shower, mod I ?Equipment: Grab bars ? ? ?IADLs: ?Shopping: needs assist for transportation ?Light housekeeping: mod I washing dishes sweeping ?Meal Prep: light meal prep mod I ?Community mobility: mod I ?Medication management: pt's dtr assists ?Financial management: Pt handles ?Handwriting: 100% legible and Mild micrographia ? ?MOBILITY STATUS: Independent and Freezing ? ?POSTURE COMMENTS:   ?rounded shoulders, forward head, and weight shift left ? ?ACTIVITY TOLERANCE: ?Activity tolerance: pt reports she is able to perform functional activity for approx 45 mins prior to rest ? ?FUNCTIOsNAL OUTCOME MEASURES: ? ?Fastening/unfastening 3 buttons: 42.94 ?Physical performance test: PPT#2 (simulated eating) 15.00 & PPT#4 (donning/doffing jacket): 14.84 ? ?COORDINATION: ?9 Hole Peg test: Right: 23.31 sec; Left: 35.72 sec ?Box and Blocks:  Right 52blocks, Left 48 blocks ? ?UE ROM:   RUE shoulder flexion 140, elbow extension -10 , LUE shoulder flexion 130, elbow extension -10 ? ? ? ?SENSATION: ?Not tested ? ? ? ? ? ?COGNITION: ?Overall cognitive status: Impaired: Memory:  ?Awareness: decreased awareness of postural changes ?Bradyphrenia: slower processing ? ? ?OBSERVATIONS: Bradykinesia and postural changes with head and neck flexion to left side, pt leans to left and is unaware of posture ? ? ?TODAY'S TREATMENT:  ?N/a ? ? ?PATIENT EDUCATION:  Eval only ?Education details: n/a ?Person educated:  n/a ?Education method:  n/a ?Education comprehension:  n/a ? ? ?HOME EXERCISE PROGRAM: ?N/a ? ?ASSESSMENT: ? ?CLINICAL IMPRESSION: ?Patient is a 71 y.o. female  who was seen today for occupational therapy evaluation for Parkinson's disease.  ? ?PERFORMANCE DEFICITS in functional skills including ADLs, IADLs, coordination, dexterity, ROM, strength, flexibility, FMC, GMC, mobility, balance, endurance, decreased knowledge of precautions, decreased knowledge of use of DME, and UE functional use, cognitive skills including attention, memory, problem solving, safety awareness, and thought, and psychosocial skills including coping strategies, environmental adaptation, and routines and behaviors.  ? ?IMPAIRMENTS are limiting patient from ADLs, IADLs, play, leisure, and social participation.  ? ?COMORBIDITIES may have co-morbidities  that affects occupational performance. Patient will benefit from skilled OT to address above  impairments and improve overall function. ? ?MODIFICATION OR ASSISTANCE TO COMPLETE EVALUATION: No modification of tasks or assist necessary to complete an evaluation. ? ?OT OCCUPATIONAL PROFILE AND HISTORY: Problem focused assessment: Including review of records relating to presenting problem. ? ?CLINICAL DECISION MAKING: LOW - limited treatment options, no task modification necessary ? ?REHAB POTENTIAL: Good ? ?EVALUATION COMPLEXITY: Low ? ? ? ? ?GOALS: ? ? ?SHORT TERM GOALS: Target date: 11/09/2021 ? ?I with PD specific HEP ? ?Goal status: INITIAL ? ?2.  Pt will verbalize understanding of adapted strategies to maximize safety and I with ADLs/ IADLs . ? ? ? ?Goal status: INITIAL ? ?3.  Pt will demonstrate improved fine motor coordination for ADLs as evidenced by decreasing 9 hole peg test score for LUE by 3 secs ? ?Baseline: R 23.31, L 35.72 ?Goal status: INITIAL ? ?4.  Pt will verbalize understanding of ways to prevent future PD related complications  ? ?Goal status: INITIAL ? ? ?LONG TERM GOALS: Target date: 12/14/21 anticipate d/c after 6 weeks dependent on progress ? ?Pt will perform functional activity for at least 6 min while maintaining upright posture/avoiding leaning to L.  ?Baseline: pt leans to left side ?Goal status: INITIAL ? ?2.  Pt will demonstrate improved ease with fastening buttons as evidenced by decreasing 3 button/ unbutton time to :72  secs or less ? ? ?Baseline: 42.94 ?Goal status: INITIAL ? ?3.  Pt will demonstrate ability to retrieve a lightweight object at 140 shoulder flexion and -5 elbow extension with LUE ? ?Baseline: LUE 130, -10 ?Goal status: INITIAL ? ?4. Pt will demonstrate improved LUE functional use for ADLs as evidenced by increasing box/ blocks score by 3 blocks with LUE. ?Baseline: RUE 52, LUE 48 ?Goal status: inital ? ?PLAN: ?OT FREQUENCY: 1x/week ? ?OT DURATION: 8 weeks plus eval  ? ?PLANNED INTERVENTIONS: self care/ADL training, therapeutic exercise, therapeutic activity,  neuromuscular re-education, manual therapy, passive range of motion, gait training, balance training, functional mobility training, splinting, ultrasound, paraffin, fluidotherapy, moist heat, patient/family education,

## 2021-10-12 NOTE — Patient Instructions (Signed)
SPEAK OUT! is a structured program targeting voice in patient's with Parkinson's. This program was developed by [employer]. It is evidence based and based on principles of motor learning. The nonprofit provides free materials to patients being treated by a Speak Out! certified SLP.    www.[email or web address] for more information   Learn about Parkinson's webinar: [email or web address] -- highly recommend watching this webinar!!   Order your stimulus booklet: [phone] Your provider: Myalee Stengel Johnson SLP, [facility]  This book is free!! They do offer a "pay if forward" option where you can donate to the non-profit organization so they can continue serving the Parkinson's population and providing these valuable resources to patients.  

## 2021-10-19 ENCOUNTER — Ambulatory Visit: Payer: Medicare Other | Admitting: Physical Therapy

## 2021-10-19 ENCOUNTER — Ambulatory Visit: Payer: Medicare Other

## 2021-10-19 ENCOUNTER — Encounter: Payer: Self-pay | Admitting: Physical Therapy

## 2021-10-19 DIAGNOSIS — R29818 Other symptoms and signs involving the nervous system: Secondary | ICD-10-CM | POA: Diagnosis not present

## 2021-10-19 DIAGNOSIS — M6281 Muscle weakness (generalized): Secondary | ICD-10-CM | POA: Diagnosis not present

## 2021-10-19 DIAGNOSIS — R293 Abnormal posture: Secondary | ICD-10-CM

## 2021-10-19 DIAGNOSIS — R2681 Unsteadiness on feet: Secondary | ICD-10-CM | POA: Diagnosis not present

## 2021-10-19 DIAGNOSIS — R471 Dysarthria and anarthria: Secondary | ICD-10-CM | POA: Diagnosis not present

## 2021-10-19 DIAGNOSIS — M25612 Stiffness of left shoulder, not elsewhere classified: Secondary | ICD-10-CM | POA: Diagnosis not present

## 2021-10-19 DIAGNOSIS — G20 Parkinson's disease: Secondary | ICD-10-CM | POA: Diagnosis not present

## 2021-10-19 DIAGNOSIS — R278 Other lack of coordination: Secondary | ICD-10-CM | POA: Diagnosis not present

## 2021-10-19 DIAGNOSIS — R2689 Other abnormalities of gait and mobility: Secondary | ICD-10-CM | POA: Diagnosis not present

## 2021-10-19 DIAGNOSIS — Z736 Limitation of activities due to disability: Secondary | ICD-10-CM | POA: Diagnosis not present

## 2021-10-19 DIAGNOSIS — R29898 Other symptoms and signs involving the musculoskeletal system: Secondary | ICD-10-CM | POA: Diagnosis not present

## 2021-10-19 DIAGNOSIS — R269 Unspecified abnormalities of gait and mobility: Secondary | ICD-10-CM | POA: Diagnosis not present

## 2021-10-19 DIAGNOSIS — R41841 Cognitive communication deficit: Secondary | ICD-10-CM | POA: Diagnosis not present

## 2021-10-19 NOTE — Therapy (Signed)
?OUTPATIENT PHYSICAL THERAPY TREATMENT NOTE ? ? ?Patient Name: Denise [name] ?MRN: [id number] ?DOB:09/25/1950, 71 y.o., female ?Today's Date: 10/20/2021 ? ?PCP: Minette Brine, FNP ?REFERRING PROVIDER: Dr. Jaynee Eagles ? ?  ? PT End of Session - 10/19/21 1619   ? ? Visit Number 2   ? Number of Visits 13   ? Date for PT Re-Evaluation 01/11/22   due to delay in scheduling  ? Authorization Type BCBS Medicare   ? PT Start Time [phone]   ? PT Stop Time 6301   ? PT Time Calculation (min) 40 min   ? Activity Tolerance Patient tolerated treatment well   ? Behavior During Therapy [facility] for tasks assessed/performed   ? ?  ?  ? ?  ? ? ?Past Medical History:  ?Diagnosis Date  ? Chronic kidney disease   ? as a child, no present issues   ? Glaucoma   ? Osteopenia 06/2018  ? T score -1.9 FRAX 3% / 0.2%  ? Tuberculosis   ? 6th grade  ? ?Past Surgical History:  ?Procedure Laterality Date  ? TUBAL LIGATION    ? ?Patient Active Problem List  ? Diagnosis Date Noted  ? Parkinson's disease ([place]) 02/17/2020  ? Gait abnormality 12/04/2019  ? Prediabetes 12/03/2018  ? Vitamin D deficiency 12/03/2018  ? Anemia due to vitamin B12 deficiency 12/03/2018  ? Fatigue 12/03/2018  ? Urinary frequency 10/01/2018  ? Vitamin B12 deficiency 10/01/2018  ? 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  ? 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 Abnormal auditory perception of both ears 05/10/2016  ? Osteopenia 08/23/2012  ? ? ?REFERRING DIAG: G20 (ICD-10-CM) - Parkinson's disease ([place])  ? ?THERAPY DIAG:  ?Other symptoms and signs involving the nervous system ? ?Abnormal posture ? ?Muscle weakness (generalized) ? ?Unsteadiness on feet ? ?PERTINENT HISTORY: PD, glaucoma, osteoporosis (newly diagnosed), TB (as a child)  ? ?T-score for left hip was - 2.7 done in January 2023 ? ?PRECAUTIONS: Fall, no driving  ? ?SUBJECTIVE: Nothing new.  ? ?PAIN:  ?Are you having pain? No ? ? ?TODAY'S TREATMENT:  ? [facility] PT Assessment - 10/19/21 1624   ? ?  ? 6 Minute Walk- Baseline  ? 6 Minute  Walk- Baseline yes   ?  ? 6 Minute walk- Post Test  ? 6 Minute Walk Post Test yes   ? Modified Borg Scale for Dyspnea 1- Very mild shortness of breath   ?  ? 6 minute walk test results   ? Aerobic Endurance Distance Walked 1217   ? Endurance additional comments reports RPE as 8/10, 1st lap: 176', last lap: 217'   ? ?  ?  ? ?  ? ?GAIT: ?Gait pattern: step through pattern, decreased arm swing- Right, decreased arm swing- Left, decreased ankle dorsiflexion- Right, decreased ankle dorsiflexion- Left, lateral lean- Left, decreased trunk rotation, trunk flexed, poor foot clearance- Right, and poor foot clearance- Left ?Distance walked: 1217' during 6MWT  ?Assistive device utilized: None ?Level of assistance: SBA ? ?Pt w/ incr forward flexed posture, decr arm swing bilat, trunk rotation, and heel strike.  ? ?Pt performs PWR! Moves in standing  position x 10 reps ?  ?PWR! Up for improved posture - needs  initial cues for proper technique and scap retraction  ? ?PWR! Rock for improved weighshifting ? ?PWR! Twist for improved trunk rotation  ? ?PWR! Step for improved step initiation - initial cues for incr foot clearance.  ? ?Reviewed technique for exercises. Pt reporting intensity level as an 8/10 and  feeling good after performing/less stiff.  ? ? ? Access Code: [email or web address] ?URL: [email or web address] ?Date: 10/19/2021 ?Prepared by: [name] ? ?Reviewed/condensed exercises for balance:  ? ?Exercises ?- Alternating Step Backward with Support  - 1 x daily - 5 x weekly - 1-2 sets - 10 reps ?- Romberg Stance Eyes Closed on Foam Pad  - 1 x daily - 5 x weekly - 3 sets - 30 hold ? ?Plus standing tall against the wall for posture - 2 x 30 second holds  ?  ?PATIENT EDUCATION: ?Education details: Results of 6MWT, reviewed HEP for balance/standing PWR moves.  ?Person educated: Patient ?Education method: Explanation/Demo ?Education comprehension: verbalized and demonstrated understanding ?  ?  ?HOME EXERCISE  PROGRAM: ?Standing PWR, [email or web address], standing tall as the wall for 30 seconds for posture. Pt has in her exercise folder.  ?  ?  ?  ?GOALS: ?Goals reviewed with patient? Yes ?  ?SHORT TERM GOALS: Target date: 11/10/2021 ?  ?Pt will improve 5x sit<>stand to less than or equal to 13.5 sec w/o UE support to demonstrate improved functional strength and transfer efficiency.  ?  ?Baseline: 15.94 seconds ?Goal status: INITIAL ?  ?2.  Pt will improve manual TUG time to 13.5 seconds or less in order to demo decrease fall risk. ?  ?Baseline: 15.4 sec ?Goal status: INITIAL ?  ?3.  Pt will undergo further assessment of 6MWT with LTG written.      ?Baseline: 1217' with RPE 8/10       ?Goal status: MET ?  ?  ?  ?LONG TERM GOALS: Target date: 12/08/2021 ?  ?           Pt will be independent with final PD HEP for improved strength, balance, transfers, and gait ?Baseline:  ?Goal status: INITIAL ?  ?2.  Pt will improve MiniBESTest score to at least 22/28 for decreased fall risk.  ?  ?Baseline: 18/28 ?Goal status: INITIAL ?  ?3.  Pt will improve TUG cognitive score to less than or equal to 15 seconds for decreased fall risk and improved dual tasking.  ?  ?Baseline: 18.75 sec ?Goal status: INITIAL ?  ?4.  Pt will improve 6MWT to at least 1250' with improved gait mechanics (arm swing, posture, and incr heel strike) and RPE to 6/10 or less in order to demo improved gait efficiency/endurance. ?Baseline: 1217' with RPE 8/10 ?Goal status: INITIAL ?  ?5.  Pt will improve gait speed with no AD to at least 3.2 ft/sec in order to demo improved community mobility.  ?  ?Baseline: 2.77 ft/sec ?Goal status: INITIAL ?  ?  ?ASSESSMENT: ?  ?CLINICAL IMPRESSION: ?Performed the 6MWT with pt able to ambulate 1217', but noted forward flexed posture, decr arm swing/trunk rotation and foot clearance. Remainder of session focused on reviewing previous standing PWR moves for HEP and standing balance strategies/posture exercises. Pt tolerated session well, will  continue per POC.  ?  ?  ?  ?OBJECTIVE IMPAIRMENTS Abnormal gait, decreased activity tolerance, decreased balance, decreased coordination, decreased endurance, decreased strength, impaired flexibility, and postural dysfunction.  ?  ?ACTIVITY LIMITATIONS community activity and driving.  ?  ?PERSONAL FACTORS Past/current experiences, Time since onset of injury/illness/exacerbation, and 1 comorbidity: osteoporosis  are also affecting patient's functional outcome.  ?  ?  ?REHAB POTENTIAL: Good ?  ?CLINICAL DECISION MAKING: Stable/uncomplicated ?  ?EVALUATION COMPLEXITY: Low ?  ?PLAN: ?PT FREQUENCY: 2x/week ?  ?PT DURATION: 12 weeks ?  ?PLANNED INTERVENTIONS: Therapeutic exercises, Therapeutic activity, Neuromuscular re-education,  Balance training, Gait training, Patient/Family education, Stair training, and DME instructions ?  ?PLAN FOR NEXT SESSION: Review seated PWR moves if needed for HEP. Pt will probably bring in her exercise folder. GAIT TRAINING WITH POSTURE, HEEL STRIKE/ARM SWING. Work on larger amplitude movements. Balance on unlevel surfaces. SLS.  ?  ? ?Arliss Journey, PT, DPT  ?10/20/2021, 9:45 AM ? ?  ? ?

## 2021-10-19 NOTE — Therapy (Signed)
?OUTPATIENT SPEECH LANGUAGE PATHOLOGY TREATMENT NOTE ? ? ?Patient Name: Denise [name] ?MRN: [id number] ?DOB:1950/08/19, 71 y.o., female ?Today's Date: 10/19/2021 ? ?PCP: Minette Brine, FNP ?REFERRING PROVIDER: Melvenia Beam, MD  ? ?END OF SESSION:  ? End of Session - 10/19/21 1535   ? ? Visit Number 2   ? Number of Visits 17   ? Date for SLP Re-Evaluation 12/10/21   ? Authorization Type BCBS Medicare   ? SLP Start Time 3419   pt arrived late  ? SLP Stop Time  1615   ? SLP Time Calculation (min) 37 min   ? Activity Tolerance Patient tolerated treatment well   ? ?  ?  ? ?  ? ? ?Past Medical History:  ?Diagnosis Date  ? Chronic kidney disease   ? as a child, no present issues   ? Glaucoma   ? Osteopenia 06/2018  ? T score -1.9 FRAX 3% / 0.2%  ? Tuberculosis   ? 6th grade  ? ?Past Surgical History:  ?Procedure Laterality Date  ? TUBAL LIGATION    ? ?Patient Active Problem List  ? Diagnosis Date Noted  ? Parkinson's disease ([place]) 02/17/2020  ? Gait abnormality 12/04/2019  ? Prediabetes 12/03/2018  ? Vitamin D deficiency 12/03/2018  ? Anemia due to vitamin B12 deficiency 12/03/2018  ? Fatigue 12/03/2018  ? Urinary frequency 10/01/2018  ? Vitamin B12 deficiency 10/01/2018  ? 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  ? 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 Abnormal auditory perception of both ears 05/10/2016  ? Osteopenia 08/23/2012  ? ? ?ONSET DATE: Diagnosed ~2 years ago  ? ?REFERRING DIAG: G20 (ICD-10-CM) - Parkinson's disease  ? ?THERAPY DIAG: Dysarthria and anarthria ? ?SUBJECTIVE: "I didn't get the workbook yet" ? ?PAIN:  ?Are you having pain? No ? ?OBJECTIVE:  ? ?TODAY'S TREATMENT:  ?10-19-21: Initiated education and instruction of Speak Out! Program for Parkinson's Disease. Targeted improving vocal quality and increasing intensity through progressively difficulty speech tasks. Conducted Lesson #1 today. ST leads pt through exercises providing usual model prior to pt execution. Occasional fading to rare min-A  required to achieve targeted dB this date. Averaged this date: loud "ah" 90 dB; reading 78 dB; cognitive speech task 76 dB. Subsequent conversational sample of approx 5 minutes, pt averaged low 70s dB with occasional min-A to maintain targeted vocal intensity as conversation progressed. ? ? ?10-12-21: Educated patient on clinical observations and recommendations for initiation of ST services to maximize vocal intensity and clarity due to reduced volume in conversation. Provided education and handout re: Speak Out! Program, in which pt will plan to watch webinar and order workbook before next ST session.  ?  ?  ?PATIENT EDUCATION: ?Education details: see above ?Person educated: Patient ?Education method: Explanation, Demonstration, and Handouts ?Education comprehension: verbalized understanding, returned demonstration, and needs further education ?  ?  ?HOME EXERCISE PROGRAM: ?Speak Out! Program  ?  ?  ?  ?  ?GOALS: ?Goals reviewed with patient? Yes ?  ?SHORT TERM GOALS: Target date: 11/12/2021 ?  ?Pt will complete HEP given occasional min A over 3 sessions ?Baseline: ?Goal status: ongoing ?  ?2.  Pt will utilize dysarthria compensations on structured speech tasks with 80% accuracy given occasional min A over 3 sessions ?Baseline:  ?Goal status: ongoing ?  ?3.  Pt will maintain WNL conversational volume (70-72 dB) in 5-10 minute conversation given occasional min A over 2 sessions ?Baseline:  ?Goal status: ongoing ?  ?4.  Pt will utilize memory/attention compensations as  needed to optimize daily functioning and reduce caregiver burden given occasional min A over 2 sessions ?Baseline:  ?Goal status: ongoing ?  ?  ?LONG TERM GOALS: Target date: 12/10/2021 ?  ?Pt will complete HEP at least 1x/day (recommend BID) > 1 week ?Baseline:  ?Goal status: ongoing ?  ?2.  Pt will maintain WNL conversational volume (70-72 dB) in 10+ minute conversation given occasional min A over 2 sessions ?Baseline:  ?Goal status: ongoing ?  ?3.  Pt  will carryover memory/attention compensations as needed to optimize daily functioning and reduce caregiver burden given rare min A > 1 week ?Baseline:  ?Goal status: ongoing ?  ?4.  Pt will report improved communication effectiveness via PROM with 2 point increase by last ST session  ?Baseline: CPIB=21 ?Goal status: ongoing ?  ?ASSESSMENT: ?  ?CLINICAL IMPRESSION: ?Patient is a 71 y.o. 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 Initiated education and training of Speak Out! Program to optimize vocal intensity and clarity. Pt able to achieve targeted dB with occasional fading to rare min A on structured tasks. Continue education and training of program to optimize patient understanding and carryover.  ?  ?OBJECTIVE IMPAIRMENTS  ?Objective impairments include memory and dysarthria. These impairments are limiting patient from household responsibilities and effectively communicating at home and in community.Factors affecting potential to achieve goals and functional outcome are medical prognosis. Patient will benefit from skilled SLP services to address above impairments and improve overall function. ?  ?REHAB POTENTIAL: Good ?  ?PLAN: ?SLP FREQUENCY: 2x/week ?  ?SLP DURATION: 8 weeks ?  ?PLANNED INTERVENTIONS: Language facilitation, Cueing hierachy, Cognitive reorganization, Internal/external aids, Functional tasks, Multimodal communication approach, SLP instruction and feedback, Compensatory strategies, and Patient/family education ?  ? ? ?Marzetta Board, CCC-SLP ?10/19/2021, 4:17 PM ? ? ?

## 2021-10-21 ENCOUNTER — Encounter: Payer: Self-pay | Admitting: Occupational Therapy

## 2021-10-21 ENCOUNTER — Ambulatory Visit: Payer: Medicare Other | Admitting: Occupational Therapy

## 2021-10-21 DIAGNOSIS — R29898 Other symptoms and signs involving the musculoskeletal system: Secondary | ICD-10-CM | POA: Diagnosis not present

## 2021-10-21 DIAGNOSIS — G20 Parkinson's disease: Secondary | ICD-10-CM | POA: Diagnosis not present

## 2021-10-21 DIAGNOSIS — R278 Other lack of coordination: Secondary | ICD-10-CM

## 2021-10-21 DIAGNOSIS — R29818 Other symptoms and signs involving the nervous system: Secondary | ICD-10-CM

## 2021-10-21 DIAGNOSIS — R471 Dysarthria and anarthria: Secondary | ICD-10-CM | POA: Diagnosis not present

## 2021-10-21 DIAGNOSIS — M25612 Stiffness of left shoulder, not elsewhere classified: Secondary | ICD-10-CM | POA: Diagnosis not present

## 2021-10-21 DIAGNOSIS — R269 Unspecified abnormalities of gait and mobility: Secondary | ICD-10-CM | POA: Diagnosis not present

## 2021-10-21 DIAGNOSIS — R2681 Unsteadiness on feet: Secondary | ICD-10-CM

## 2021-10-21 DIAGNOSIS — R41841 Cognitive communication deficit: Secondary | ICD-10-CM | POA: Diagnosis not present

## 2021-10-21 DIAGNOSIS — M6281 Muscle weakness (generalized): Secondary | ICD-10-CM | POA: Diagnosis not present

## 2021-10-21 DIAGNOSIS — R293 Abnormal posture: Secondary | ICD-10-CM | POA: Diagnosis not present

## 2021-10-21 DIAGNOSIS — R2689 Other abnormalities of gait and mobility: Secondary | ICD-10-CM | POA: Diagnosis not present

## 2021-10-21 DIAGNOSIS — Z736 Limitation of activities due to disability: Secondary | ICD-10-CM | POA: Diagnosis not present

## 2021-10-21 NOTE — Patient Instructions (Signed)
PWR! Hand Exercises ? ?Then, start with elbows bent and hands closed: ? ?PWR! Hands: Push hands out BIG. Elbows straight, wrists up, fingers open and spread apart BIG.  ? ?PWR! Step: Touch index finger to thumb while keeping other fingers straight. Flick fingers out BIG (thumb out/straighten fingers). Repeat with other fingers. (Step your thumb to each finger). ? ? ?With arms stretched out in front of you (elbows straight), perform the following: ? ?PWR! Rock:  Move wrists up and down BIG ? ?PWR! Twist: Twist palms up and down BIG ? ?** Make each movement big and deliberate so that you feel the movement. ? ? ? ? ?Coordination Exercises ?  ?Perform the following exercises for 20 minutes 1 times per day. Perform with both hand(s). Perform using big movements. ?  ?Flipping Cards: Place deck of cards on the table. Flip cards over by opening your hand big to grasp and then turn your palm up big. ?Deal cards: Hold 1/2 or whole deck in your hand. Use thumb to push card off top of deck with one big push. ?Rotate ball with fingertips: Pick up with fingers/thumb and move as much as you can with each turn/movement (clockwise and counter-clockwise). ?Toss ball from one hand to the other: Toss big/high. ?Pick up coins and stack one at a time: Pick up with big, intentional movements. Do not drag coin to the edge. (5-10 in a stack) ?Pick up 5-10 coins one at a time and hold in palm. Then, move coins from palm to fingertips one at time and place in coin bank/container. ? ?

## 2021-10-21 NOTE — Therapy (Signed)
?OUTPATIENT OCCUPATIONAL THERAPY TREATMENT NOTE ? ? ?Patient Name: Denise [name] ?MRN: [id number] ?DOB:09-12-1950, 71 y.o., female ?Today's Date: 10/21/2021 ? ?PCP: Minette Brine, FNP ?REFERRING PROVIDER:  Dr. Jaynee Eagles  ? ?END OF SESSION:  ? OT End of Session - 10/21/21 1233   ? ? Visit Number 2   ? Number of Visits 9   ? Date for OT Re-Evaluation 12/14/21   ? Authorization Type BCBS Medicare:  no auth, no visit limit $40 copay-   ? Authorization Time Period 8 weeks, however pt is only scheduled for 6 weeks   ? Authorization - Visit Number 2   ? Authorization - Number of Visits 10   ? Progress Note Due on Visit 10   ? OT Start Time 1233   ? OT Stop Time 1315   ? OT Time Calculation (min) 42 min   ? Activity Tolerance Patient tolerated treatment well   ? Behavior During Therapy [facility] for tasks assessed/performed   ? ?  ?  ? ?  ? ? ?Past Medical History:  ?Diagnosis Date  ? Chronic kidney disease   ? as a child, no present issues   ? Glaucoma   ? Osteopenia 06/2018  ? T score -1.9 FRAX 3% / 0.2%  ? Tuberculosis   ? 6th grade  ? ?Past Surgical History:  ?Procedure Laterality Date  ? TUBAL LIGATION    ? ?Patient Active Problem List  ? Diagnosis Date Noted  ? Parkinson's disease ([place]) 02/17/2020  ? Gait abnormality 12/04/2019  ? Prediabetes 12/03/2018  ? Vitamin D deficiency 12/03/2018  ? Anemia due to vitamin B12 deficiency 12/03/2018  ? Fatigue 12/03/2018  ? Urinary frequency 10/01/2018  ? Vitamin B12 deficiency 10/01/2018  ? 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  ? 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 Abnormal auditory perception of both ears 05/10/2016  ? Osteopenia 08/23/2012  ? ? ?ONSET DATE: 09/27/21   ? ?REFERRING DIAG: 09/27/21  ? ?THERAPY DIAG: Parkinson's disease ?Other symptoms and signs involving the nervous system ? ?Abnormal posture ? ?Unsteadiness on feet ? ?Stiffness of left shoulder, not elsewhere classified ? ?Other symptoms and signs involving the musculoskeletal system ? ?Other lack of  coordination ? ? ?PERTINENT HISTORY: returns to occupational therapy for Parkinson's disease for follow up eval as recommended when last d/c. PMH: ostopenia, glaucoma ? ?PRECAUTIONS: fall ? ?SUBJECTIVE: Pt reports that she has been working on her exercises and been trying to avoid leaning ? ?PAIN:  ?Are you having pain? No ? ? ? ? ?OBJECTIVE:  ? ?TODAY'S TREATMENT: ?10/21/21:   ?Reviewed supine PWR! Moves (up, rock, twist) with min cueing for large amplitude movements.  Reviewed standing closed-chain shoulder flex (floor>ceiling) and diagonals with BUEs as well as bilateral wall slides for shoulder flexion and abductions (HEP from prior OT to address posture).--pt has handouts and reports performing at home. ? ?Reviewed PWR! Hands (basic 4) with occasional min v.c. for large amplitude and re-issued.  Began review of coordination HEP (flipping cards, dealing cards, and stacking coins with each UE with min cueing for large amplitude movement techniques).--instructed pt to try to keep chest up (posture) during coordination activities.   ? ?PATIENT EDUCATION: ?Education details: PWR! Hands and coordination HEP ?Person educated: Patient ?Education method: Explanation, Demonstration, and Handouts ?Education comprehension: verbalized understanding and returned demonstration ? ? ?ASSESSMENT: ?  ?CLINICAL IMPRESSION: ?Pt demo good understanding of previous HEP reviewed today:  including supine PWR! Moves, shoulder/posture HEP, PWR! Hands, and coordination HEP.  Pt demo decr lateral  head flex/trunk lean, but continues to demo forward head/rounded shoulders.   ?  ?PERFORMANCE DEFICITS in functional skills including ADLs, IADLs, coordination, dexterity, ROM, strength, flexibility, FMC, GMC, mobility, balance, endurance, decreased knowledge of precautions, decreased knowledge of use of DME, and UE functional use, cognitive skills including attention, memory, problem solving, safety awareness, and thought, and psychosocial skills  including coping strategies, environmental adaptation, and routines and behaviors.  ?  ?IMPAIRMENTS are limiting patient from ADLs, IADLs, play, leisure, and social participation.  ?  ?COMORBIDITIES may have co-morbidities  that affects occupational performance. Patient will benefit from skilled OT to address above impairments and improve overall function. ?  ?MODIFICATION OR ASSISTANCE TO COMPLETE EVALUATION: No modification of tasks or assist necessary to complete an evaluation. ?  ?OT OCCUPATIONAL PROFILE AND HISTORY: Problem focused assessment: Including review of records relating to presenting problem. ?  ?CLINICAL DECISION MAKING: LOW - limited treatment options, no task modification necessary ?  ?REHAB POTENTIAL: Good ?  ?EVALUATION COMPLEXITY: Low ?  ?   ?  ?GOALS: ?  ?  ?SHORT TERM GOALS: Target date: 11/09/2021 ?  ?I with PD specific HEP ?  ?Goal status: INITIAL ?  ?2.  Pt will verbalize understanding of adapted strategies to maximize safety and I with ADLs/ IADLs . ?  ?  ?  ?Goal status: INITIAL ?  ?3.  Pt will demonstrate improved fine motor coordination for ADLs as evidenced by decreasing 9 hole peg test score for LUE by 3 secs ?  ?Baseline: R 23.31, L 35.72 ?Goal status: INITIAL ?  ?4.  Pt will verbalize understanding of ways to prevent future PD related complications  ?  ?Goal status: INITIAL ?  ?  ?LONG TERM GOALS: Target date: 12/14/21 anticipate d/c after 6 weeks dependent on progress ?  ?Pt will perform functional activity for at least 6 min while maintaining upright posture/avoiding leaning to L.  ?Baseline: pt leans to left side ?Goal status: INITIAL ?  ?2.  Pt will demonstrate improved ease with fastening buttons as evidenced by decreasing 3 button/ unbutton time to :39 secs or less ?  ?  ?Baseline: 42.94 ?Goal status: INITIAL ?  ?3.  Pt will demonstrate ability to retrieve a lightweight object at 140 shoulder flexion and -5 elbow extension with LUE ?  ?Baseline: LUE 130, -10 ?Goal status:  INITIAL ?  ?4. Pt will demonstrate improved LUE functional use for ADLs as evidenced by increasing box/ blocks score by 3 blocks with LUE. ?Baseline: RUE 52, LUE 48 ?Goal status: inital ?  ?PLAN: ?OT FREQUENCY: 1x/week ?  ?OT DURATION: 8 weeks plus eval  ?  ?PLANNED INTERVENTIONS: self care/ADL training, therapeutic exercise, therapeutic activity, neuromuscular re-education, manual therapy, passive range of motion, gait training, balance training, functional mobility training, splinting, ultrasound, paraffin, fluidotherapy, moist heat, patient/family education, cognitive remediation/compensation, energy conservation, coping strategies training, and DME and/or AE instructions ?  ?RECOMMENDED OTHER SERVICES: n/a ?  ?CONSULTED AND AGREED WITH PLAN OF CARE: Patient ?  ?PLAN FOR NEXT SESSION:  practice turning magazine pages (per pt request due to difficulty), review remaining HEP prn, functional reaching, review strategies for ADLs. ? ?Vianne Bulls, [place] ?10/21/2021, 2:27 PM ? ? ?Vianne Bulls, OTR/L ?[place] ?[place], [place]  [id number] ?[phone] phone ?[phone] ?10/21/21 2:27 PM ? ? ?  ? ? ? ?

## 2021-10-23 ENCOUNTER — Other Ambulatory Visit: Payer: Self-pay | Admitting: Nurse Practitioner

## 2021-10-26 ENCOUNTER — Ambulatory Visit: Payer: Medicare Other

## 2021-10-26 ENCOUNTER — Ambulatory Visit: Payer: Medicare Other | Admitting: Physical Therapy

## 2021-10-26 DIAGNOSIS — R2681 Unsteadiness on feet: Secondary | ICD-10-CM | POA: Diagnosis not present

## 2021-10-26 DIAGNOSIS — R471 Dysarthria and anarthria: Secondary | ICD-10-CM | POA: Diagnosis not present

## 2021-10-26 DIAGNOSIS — G20 Parkinson's disease: Secondary | ICD-10-CM | POA: Diagnosis not present

## 2021-10-26 DIAGNOSIS — R29898 Other symptoms and signs involving the musculoskeletal system: Secondary | ICD-10-CM | POA: Diagnosis not present

## 2021-10-26 DIAGNOSIS — R278 Other lack of coordination: Secondary | ICD-10-CM | POA: Diagnosis not present

## 2021-10-26 DIAGNOSIS — R2689 Other abnormalities of gait and mobility: Secondary | ICD-10-CM

## 2021-10-26 DIAGNOSIS — M6281 Muscle weakness (generalized): Secondary | ICD-10-CM | POA: Diagnosis not present

## 2021-10-26 DIAGNOSIS — Z736 Limitation of activities due to disability: Secondary | ICD-10-CM | POA: Diagnosis not present

## 2021-10-26 DIAGNOSIS — M25612 Stiffness of left shoulder, not elsewhere classified: Secondary | ICD-10-CM | POA: Diagnosis not present

## 2021-10-26 DIAGNOSIS — R293 Abnormal posture: Secondary | ICD-10-CM | POA: Diagnosis not present

## 2021-10-26 DIAGNOSIS — R41841 Cognitive communication deficit: Secondary | ICD-10-CM | POA: Diagnosis not present

## 2021-10-26 DIAGNOSIS — R29818 Other symptoms and signs involving the nervous system: Secondary | ICD-10-CM | POA: Diagnosis not present

## 2021-10-26 DIAGNOSIS — R269 Unspecified abnormalities of gait and mobility: Secondary | ICD-10-CM | POA: Diagnosis not present

## 2021-10-26 NOTE — Therapy (Signed)
?OUTPATIENT SPEECH LANGUAGE PATHOLOGY TREATMENT NOTE ? ? ?Patient Name: Denise [name] ?MRN: [id number] ?DOB:[birth year], 71 y.o., female ?Today's Date: 10/26/2021 ? ?PCP: Minette Brine, FNP ?REFERRING PROVIDER: Melvenia Beam, MD  ? ?END OF SESSION:  ? End of Session - 10/26/21 1618   ? ? Visit Number 3   ? Number of Visits 17   ? Date for SLP Re-Evaluation 12/10/21   ? Authorization Type BCBS Medicare   ? SLP Start Time [id number]   ? SLP Stop Time  1700   ? SLP Time Calculation (min) 42 min   ? Activity Tolerance Patient tolerated treatment well   ? ?  ?  ? ?  ? ? ?Past Medical History:  ?Diagnosis Date  ? Chronic kidney disease   ? as a child, no present issues   ? Glaucoma   ? Osteopenia 06/2018  ? T score -1.9 FRAX 3% / 0.2%  ? Tuberculosis   ? 6th grade  ? ?Past Surgical History:  ?Procedure Laterality Date  ? TUBAL LIGATION    ? ?Patient Active Problem List  ? Diagnosis Date Noted  ? Parkinson's disease ([place]) 02/17/2020  ? Gait abnormality 12/04/2019  ? Prediabetes 12/03/2018  ? Vitamin D deficiency 12/03/2018  ? Anemia due to vitamin B12 deficiency 12/03/2018  ? Fatigue 12/03/2018  ? Urinary frequency 10/01/2018  ? Vitamin B12 deficiency 10/01/2018  ? 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  ? 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 Abnormal auditory perception of both ears 05/10/2016  ? Osteopenia 08/23/2012  ? ? ?ONSET DATE: Diagnosed ~2 years ago  ? ?REFERRING DIAG: G20 (ICD-10-CM) - Parkinson's disease  ? ?THERAPY DIAG: Dysarthria and anarthria ? ?SUBJECTIVE: "I did the online practice" ? ?PAIN:  ?Are you having pain? No ? ?OBJECTIVE:  ? ?TODAY'S TREATMENT:  ?10-26-21: Pt entered with suboptimal conversational volume averaging mid 60s dB. Conducted ongoing education and instruction of Speak Out! Program for Parkinson's Disease. Targeted improving vocal quality and increasing intensity through progressively difficulty speech tasks. Conducted Lesson #4 today. ST leads pt through exercises providing occasional  model to aid pt execution. Rare min-A required to achieve targeted dB this date. Averaged this date: loud "ah" 90 dB; reading 77 dB; cognitive speech task 75 dB. Subsequent conversational sample of approx 5 minutes, pt averaged low 70s dB with rare min-A to maintain targeted vocal intensity as conversation progressed. ? ?10-19-21: Initiated education and instruction of Speak Out! Program for Parkinson's Disease. Targeted improving vocal quality and increasing intensity through progressively difficulty speech tasks. Conducted Lesson #1 today. ST leads pt through exercises providing usual model prior to pt execution. Occasional fading to rare min-A required to achieve targeted dB this date. Averaged this date: loud "ah" 90 dB; reading 78 dB; cognitive speech task 76 dB. Subsequent conversational sample of approx 5 minutes, pt averaged low 70s dB with occasional min-A to maintain targeted vocal intensity as conversation progressed. ? ?10-12-21: Educated patient on clinical observations and recommendations for initiation of ST services to maximize vocal intensity and clarity due to reduced volume in conversation. Provided education and handout re: Speak Out! Program, in which pt will plan to watch webinar and order workbook before next ST session.  ?  ?  ?PATIENT EDUCATION: ?Education details: see above ?Person educated: Patient ?Education method: Explanation, Demonstration, and Handouts ?Education comprehension: verbalized understanding, returned demonstration, and needs further education ?  ?  ?HOME EXERCISE PROGRAM: ?Speak Out! Program  ?  ?  ?  ?  ?GOALS: ?Goals reviewed with  patient? Yes ?  ?SHORT TERM GOALS: Target date: 11/12/2021 ?  ?Pt will complete HEP given occasional min A over 3 sessions ?Baseline: 10-26-21 ?Goal status: ongoing ?  ?2.  Pt will utilize dysarthria compensations on structured speech tasks with 80% accuracy given occasional min A over 3 sessions ?Baseline: 10-26-21 ?Goal status: ongoing ?  ?3.  Pt  will maintain WNL conversational volume (70-72 dB) in 5-10 minute conversation given occasional min A over 2 sessions ?Baseline: 10-26-21 ?Goal status: ongoing ?  ?4.  Pt will utilize memory/attention compensations as needed to optimize daily functioning and reduce caregiver burden given occasional min A over 2 sessions ?Baseline:  ?Goal status: ongoing ?  ?  ?LONG TERM GOALS: Target date: 12/10/2021 ?  ?Pt will complete HEP at least 1x/day (recommend BID) > 1 week ?Baseline:  ?Goal status: ongoing ?  ?2.  Pt will maintain WNL conversational volume (70-72 dB) in 10+ minute conversation given occasional min A over 2 sessions ?Baseline:  ?Goal status: ongoing ?  ?3.  Pt will carryover memory/attention compensations as needed to optimize daily functioning and reduce caregiver burden given rare min A > 1 week ?Baseline:  ?Goal status: ongoing ?  ?4.  Pt will report improved communication effectiveness via PROM with 2 point increase by last ST session  ?Baseline: CPIB=21 ?Goal status: ongoing ?  ?ASSESSMENT: ?  ?CLINICAL IMPRESSION: ?Patient is a 71 y.o. 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 Conducted education and training of Speak Out! Program to optimize vocal intensity and clarity. Pt able to achieve targeted dB with rare min A on structured tasks. Continue education and training of program to optimize patient understanding and carryover in conversation.  ?  ?OBJECTIVE IMPAIRMENTS  ?Objective impairments include memory and dysarthria. These impairments are limiting patient from household responsibilities and effectively communicating at home and in community.Factors affecting potential to achieve goals and functional outcome are medical prognosis. Patient will benefit from skilled SLP services to address above impairments and improve overall function. ?  ?REHAB POTENTIAL: Good ?  ?PLAN: ?SLP FREQUENCY: 2x/week ?  ?SLP DURATION: 8 weeks ?   ?PLANNED INTERVENTIONS: Language facilitation, Cueing hierachy, Cognitive reorganization, Internal/external aids, Functional tasks, Multimodal communication approach, SLP instruction and feedback, Compensatory strategies, and Patient/family education ?  ? ? ?Marzetta Board, CCC-SLP ?10/26/2021, 4:21 PM ? ? ?

## 2021-10-26 NOTE — Therapy (Signed)
?OUTPATIENT PHYSICAL THERAPY TREATMENT NOTE ? ? ?Patient Name: Denise [name] ?MRN: [id number] ?DOB:10/30/1950, 71 y.o., female ?Today's Date: 10/26/2021 ? ?PCP: Minette Brine, FNP ?REFERRING PROVIDER: Dr. Jaynee Eagles ? ?  ? PT End of Session - 10/26/21 1538   ? ? Visit Number 3   ? Number of Visits 13   ? Date for PT Re-Evaluation 01/11/22   due to delay in scheduling  ? Authorization Type BCBS Medicare   ? PT Start Time 1536   Pt in restroom  ? PT Stop Time 3976   ? PT Time Calculation (min) 42 min   ? Activity Tolerance Patient tolerated treatment well   ? Behavior During Therapy [facility] for tasks assessed/performed   ? ?  ?  ? ?  ? ? ?Past Medical History:  ?Diagnosis Date  ? Chronic kidney disease   ? as a child, no present issues   ? Glaucoma   ? Osteopenia 06/2018  ? T score -1.9 FRAX 3% / 0.2%  ? Tuberculosis   ? 6th grade  ? ?Past Surgical History:  ?Procedure Laterality Date  ? TUBAL LIGATION    ? ?Patient Active Problem List  ? Diagnosis Date Noted  ? Parkinson's disease ([place]) 02/17/2020  ? Gait abnormality 12/04/2019  ? Prediabetes 12/03/2018  ? Vitamin D deficiency 12/03/2018  ? Anemia due to vitamin B12 deficiency 12/03/2018  ? Fatigue 12/03/2018  ? Urinary frequency 10/01/2018  ? Vitamin B12 deficiency 10/01/2018  ? 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  ? 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 Abnormal auditory perception of both ears 05/10/2016  ? Osteopenia 08/23/2012  ? ? ?REFERRING DIAG: G20 (ICD-10-CM) - Parkinson's disease ([place])  ? ?THERAPY DIAG:  ?Unsteadiness on feet ? ?Other abnormalities of gait and mobility ? ?Muscle weakness (generalized) ? ?PERTINENT HISTORY: PD, glaucoma, osteoporosis (newly diagnosed), TB (as a child)  ? ?T-score for left hip was - 2.7 done in January 2023 ? ?PRECAUTIONS: Fall, no driving  ? ?SUBJECTIVE: No new changes to report  ? ?PAIN:  ?Are you having pain? No ? ? ?TODAY'S TREATMENT:  ?NMR  ?Reviewed seated PWR moves provided by [facility] Certified therapist in prior bout of  therapy  ?Pt performs PWR! Moves in seated position x 10 reps ?  ?PWR! Up for improved posture ?PWR! Rock for improved weighshifting ?PWR! Twist for improved trunk rotation  ?PWR! Step for improved step initiation ("out,out, in, in")  ? ?Cues provided for supination of hands, big posture, and high amplitude stepping. Wrote "out,out,in,in" cue for pt on sheet.  ? ?Gait training w/unilateral OH scarf throw for upright posture, UE/LE coordination and dual tasking. Pt required max concurrent cues initially, tapered off to knowledge of results cues. Pt ambulated 115' throwing w/RUE and 115' throwing w/LUE. Noted decreased amplitude in scarf throw w/increased distance, requiring min cues for "large throw". Min guard throughout as pt frequently running into obstacles on L side.  ? ?Ther Ex ?SciFit sprint hills level 2 for 8 minutes with use of BLE/BUEs for neural priming for reciprocal movement, dynamic cardiovascular warmup and endurance. Min verbal cues for improved knee extension w/each step.  ? ? ?  ?PATIENT EDUCATION: ?Education details: Results of 6MWT, reviewed HEP for balance/standing PWR moves.  ?Person educated: Patient ?Education method: Explanation/Demo ?Education comprehension: verbalized and demonstrated understanding ?  ?  ?HOME EXERCISE PROGRAM: ?Standing and seated PWR, Pt has in her exercise folder.  ? ? Access Code: [email or web address] ?URL: [email or web address] ?Date: 10/19/2021 ?Prepared by: [name] ? ?Reviewed/condensed exercises for  balance:  ? ?Exercises ?- Alternating Step Backward with Support  - 1 x daily - 5 x weekly - 1-2 sets - 10 reps ?- Romberg Stance Eyes Closed on Foam Pad  - 1 x daily - 5 x weekly - 3 sets - 30 hold ? ?Plus standing tall against the wall for posture - 2 x 30 second holds  ?  ?  ?  ?GOALS: ?Goals reviewed with patient? Yes ?  ?SHORT TERM GOALS: Target date: 11/10/2021 ?  ?Pt will improve 5x sit<>stand to less than or equal to 13.5 sec w/o UE support to  demonstrate improved functional strength and transfer efficiency.  ?  ?Baseline: 15.94 seconds ?Goal status: INITIAL ?  ?2.  Pt will improve manual TUG time to 13.5 seconds or less in order to demo decrease fall risk. ?  ?Baseline: 15.4 sec ?Goal status: INITIAL ?  ?3.  Pt will undergo further assessment of 6MWT with LTG written.      ?Baseline: 1217' with RPE 8/10       ?Goal status: MET ?  ?  ?  ?LONG TERM GOALS: Target date: 12/08/2021 ?  ?           Pt will be independent with final PD HEP for improved strength, balance, transfers, and gait ?Baseline:  ?Goal status: INITIAL ?  ?2.  Pt will improve MiniBESTest score to at least 22/28 for decreased fall risk.  ?  ?Baseline: 18/28 ?Goal status: INITIAL ?  ?3.  Pt will improve TUG cognitive score to less than or equal to 15 seconds for decreased fall risk and improved dual tasking.  ?  ?Baseline: 18.75 sec ?Goal status: INITIAL ?  ?4.  Pt will improve 6MWT to at least 1250' with improved gait mechanics (arm swing, posture, and incr heel strike) and RPE to 6/10 or less in order to demo improved gait efficiency/endurance. ?Baseline: 1217' with RPE 8/10 ?Goal status: INITIAL ?  ?5.  Pt will improve gait speed with no AD to at least 3.2 ft/sec in order to demo improved community mobility.  ?  ?Baseline: 2.77 ft/sec ?Goal status: INITIAL ?  ?  ?ASSESSMENT: ?  ?CLINICAL IMPRESSION: ?Emphasis of skilled PT session on reviewing seated PWR moves and improved posture w/gait. Pt demonstrated and recalled seated PWR moves well but had difficulty w/PWR step. Wrote down cue of "out,out,in,in" to assist pt in remembering movement at home. Pt demonstrated improved upright posture during scarf throw exercise but required min cues to maintain high amplitude of throws throughout. Continue POC.  ?  ?  ?  ?OBJECTIVE IMPAIRMENTS Abnormal gait, decreased activity tolerance, decreased balance, decreased coordination, decreased endurance, decreased strength, impaired flexibility, and postural  dysfunction.  ?  ?ACTIVITY LIMITATIONS community activity and driving.  ?  ?PERSONAL FACTORS Past/current experiences, Time since onset of injury/illness/exacerbation, and 1 comorbidity: osteoporosis  are also affecting patient's functional outcome.  ?  ?  ?REHAB POTENTIAL: Good ?  ?CLINICAL DECISION MAKING: Stable/uncomplicated ?  ?EVALUATION COMPLEXITY: Low ?  ?PLAN: ?PT FREQUENCY: 2x/week ?  ?PT DURATION: 12 weeks ?  ?PLANNED INTERVENTIONS: Therapeutic exercises, Therapeutic activity, Neuromuscular re-education, Balance training, Gait training, Patient/Family education, Stair training, and DME instructions ?  ?PLAN FOR NEXT SESSION: Review seated PWR moves if needed for HEP. Pt will probably bring in her exercise folder. GAIT TRAINING WITH POSTURE, HEEL STRIKE/ARM SWING. Work on larger amplitude movements. Balance on unlevel surfaces. SLS. UE/LE coordination, dual-tasks (cog/motor)  ?  ? ?Cruzita Lederer Neva Ramaswamy, PT, DPT  ?  10/26/2021, 4:52 PM ? ?  ? ?

## 2021-10-28 ENCOUNTER — Encounter: Payer: Self-pay | Admitting: Occupational Therapy

## 2021-10-28 ENCOUNTER — Ambulatory Visit: Payer: Medicare Other | Admitting: Occupational Therapy

## 2021-10-28 ENCOUNTER — Ambulatory Visit: Payer: Medicare Other

## 2021-10-28 DIAGNOSIS — R2681 Unsteadiness on feet: Secondary | ICD-10-CM

## 2021-10-28 DIAGNOSIS — G20 Parkinson's disease: Secondary | ICD-10-CM | POA: Diagnosis not present

## 2021-10-28 DIAGNOSIS — R269 Unspecified abnormalities of gait and mobility: Secondary | ICD-10-CM | POA: Diagnosis not present

## 2021-10-28 DIAGNOSIS — R471 Dysarthria and anarthria: Secondary | ICD-10-CM

## 2021-10-28 DIAGNOSIS — R41841 Cognitive communication deficit: Secondary | ICD-10-CM | POA: Diagnosis not present

## 2021-10-28 DIAGNOSIS — M25612 Stiffness of left shoulder, not elsewhere classified: Secondary | ICD-10-CM | POA: Diagnosis not present

## 2021-10-28 DIAGNOSIS — R29898 Other symptoms and signs involving the musculoskeletal system: Secondary | ICD-10-CM | POA: Diagnosis not present

## 2021-10-28 DIAGNOSIS — Z736 Limitation of activities due to disability: Secondary | ICD-10-CM | POA: Diagnosis not present

## 2021-10-28 DIAGNOSIS — R29818 Other symptoms and signs involving the nervous system: Secondary | ICD-10-CM

## 2021-10-28 DIAGNOSIS — R278 Other lack of coordination: Secondary | ICD-10-CM | POA: Diagnosis not present

## 2021-10-28 DIAGNOSIS — R2689 Other abnormalities of gait and mobility: Secondary | ICD-10-CM | POA: Diagnosis not present

## 2021-10-28 DIAGNOSIS — M6281 Muscle weakness (generalized): Secondary | ICD-10-CM

## 2021-10-28 DIAGNOSIS — R293 Abnormal posture: Secondary | ICD-10-CM | POA: Diagnosis not present

## 2021-10-28 NOTE — Therapy (Signed)
?OUTPATIENT OCCUPATIONAL THERAPY TREATMENT NOTE ? ? ?Patient Name: Denise [name] ?MRN: [id number] ?DOB:1950/10/05, 70 y.o., female ?Today's Date: 10/28/2021 ? ?PCP: Minette Brine, FNP ?REFERRING PROVIDER:  Dr. Jaynee Eagles  ? ?END OF SESSION:  ? OT End of Session - 10/28/21 1531   ? ? Visit Number 3   ? Number of Visits 9   ? Date for OT Re-Evaluation 12/14/21   ? Authorization Type BCBS Medicare:  no auth, no visit limit $40 copay-   ? Authorization Time Period 8 weeks, however pt is only scheduled for 6 weeks   ? Authorization - Visit Number 3   ? Authorization - Number of Visits 10   ? Progress Note Due on Visit 10   ? OT Start Time 1531   ? OT Stop Time 1615   ? OT Time Calculation (min) 44 min   ? Activity Tolerance Patient tolerated treatment well   ? Behavior During Therapy [facility] for tasks assessed/performed   ? ?  ?  ? ?  ? ? ?Past Medical History:  ?Diagnosis Date  ? Chronic kidney disease   ? as a child, no present issues   ? Glaucoma   ? Osteopenia 06/2018  ? T score -1.9 FRAX 3% / 0.2%  ? Tuberculosis   ? 6th grade  ? ?Past Surgical History:  ?Procedure Laterality Date  ? TUBAL LIGATION    ? ?Patient Active Problem List  ? Diagnosis Date Noted  ? Parkinson's disease ([place]) 02/17/2020  ? Gait abnormality 12/04/2019  ? Prediabetes 12/03/2018  ? Vitamin D deficiency 12/03/2018  ? Anemia due to vitamin B12 deficiency 12/03/2018  ? Fatigue 12/03/2018  ? Urinary frequency 10/01/2018  ? Vitamin B12 deficiency 10/01/2018  ? 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  ? 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 Abnormal auditory perception of both ears 05/10/2016  ? Osteopenia 08/23/2012  ? ? ?ONSET DATE: 09/27/21   ? ?REFERRING DIAG: 09/27/21  ? ?THERAPY DIAG: Parkinson's disease ?Other symptoms and signs involving the musculoskeletal system ? ?Unsteadiness on feet ? ?Muscle weakness (generalized) ? ?Other lack of coordination ? ?Other symptoms and signs involving the nervous system ? ?Stiffness of left shoulder, not elsewhere  classified ? ? ?PERTINENT HISTORY: returns to occupational therapy for Parkinson's disease for follow up eval as recommended when last d/c. PMH: ostopenia, glaucoma ? ?PRECAUTIONS: fall ? ?SUBJECTIVE: Pt reports that she has been working on her exercises and been trying to avoid leaning ? ?PAIN:  ?Are you having pain? No ? ? ? ? ?OBJECTIVE:  ? ?TODAY'S TREATMENT: ?10/28/21 ?Big amplitude movements with BUE hands x 10 reps prior to coordination tasks. ?  ?Turning pages of magazine with RUE with emphasis on using bigger movements and flat hand on page for turning with big powerful movements for page turn.  ? ?Small Peg Board (3 triangles, blue green yellow) with LUE with one at a time for placing pegs into board with emphasis on big and open hand prior to grasping each peg. Pt removed with one at a time. Pt copied pattern with 75% accuracy. Pt req'd increased time and moderate cues for completing and self correcting errors. Coordination good but req'd increased time.  ? ? ? ?ASSESSMENT: ?  ?CLINICAL IMPRESSION: ?Pt with good response to strategies for big amplitude movements with hands prior to coordination and carryover of strategies throughout task. ?  ?PERFORMANCE DEFICITS in functional skills including ADLs, IADLs, coordination, dexterity, ROM, strength, flexibility, FMC, GMC, mobility, balance, endurance, decreased knowledge of precautions, decreased  knowledge of use of DME, and UE functional use, cognitive skills including attention, memory, problem solving, safety awareness, and thought, and psychosocial skills including coping strategies, environmental adaptation, and routines and behaviors.  ?  ?IMPAIRMENTS are limiting patient from ADLs, IADLs, play, leisure, and social participation.  ?  ?COMORBIDITIES may have co-morbidities  that affects occupational performance. Patient will benefit from skilled OT to address above impairments and improve overall function. ?  ?MODIFICATION OR ASSISTANCE TO COMPLETE  EVALUATION: No modification of tasks or assist necessary to complete an evaluation. ?  ?OT OCCUPATIONAL PROFILE AND HISTORY: Problem focused assessment: Including review of records relating to presenting problem. ?  ?CLINICAL DECISION MAKING: LOW - limited treatment options, no task modification necessary ?  ?REHAB POTENTIAL: Good ?  ?EVALUATION COMPLEXITY: Low ?  ?   ?  ?GOALS: ?  ?  ?SHORT TERM GOALS: Target date: 11/09/2021 ?  ?I with PD specific HEP ? Goal status: INITIAL ?  ?2.  Pt will verbalize understanding of adapted strategies to maximize safety and I with ADLs/ IADLs . ? Goal status: INITIAL ?  ?3.  Pt will demonstrate improved fine motor coordination for ADLs as evidenced by decreasing 9 hole peg test score for LUE by 3 secs ?  ?Baseline: R 23.31, L 35.72 ?Goal status: INITIAL ?  ?4.  Pt will verbalize understanding of ways to prevent future PD related complications  ?  ?Goal status: INITIAL ?  ?  ?LONG TERM GOALS: Target date: 12/14/21 anticipate d/c after 6 weeks dependent on progress ?  ?Pt will perform functional activity for at least 6 min while maintaining upright posture/avoiding leaning to L.  ?Baseline: pt leans to left side ?Goal status: INITIAL ?  ?2.  Pt will demonstrate improved ease with fastening buttons as evidenced by decreasing 3 button/ unbutton time to :39 secs or less ?  ?  ?Baseline: 42.94 ?Goal status: INITIAL ?  ?3.  Pt will demonstrate ability to retrieve a lightweight object at 140 shoulder flexion and -5 elbow extension with LUE ?  ?Baseline: LUE 130, -10 ?Goal status: INITIAL ?  ?4. Pt will demonstrate improved LUE functional use for ADLs as evidenced by increasing box/ blocks score by 3 blocks with LUE. ?Baseline: RUE 52, LUE 48 ?Goal status: inital ?  ?PLAN: ?OT FREQUENCY: 1x/week ?  ?OT DURATION: 8 weeks plus eval  ?  ?PLANNED INTERVENTIONS: self care/ADL training, therapeutic exercise, therapeutic activity, neuromuscular re-education, manual therapy, passive range of motion,  gait training, balance training, functional mobility training, splinting, ultrasound, paraffin, fluidotherapy, moist heat, patient/family education, cognitive remediation/compensation, energy conservation, coping strategies training, and DME and/or AE instructions ?  ?RECOMMENDED OTHER SERVICES: n/a ?  ?CONSULTED AND AGREED WITH PLAN OF CARE: Patient ?  ?PLAN FOR NEXT SESSION:  review remaining HEP prn, functional reaching, review strategies for ADLs. ? ?Zachery Conch, OT ?10/28/2021, 3:53 PM ? ? ?Vianne Bulls, OTR/L ?[place] ?[place], [place]  [address] ?[phone] phone ?[phone] ?10/28/21 3:53 PM ? ? ?  ? ? ? ?

## 2021-10-28 NOTE — Therapy (Signed)
?OUTPATIENT SPEECH LANGUAGE PATHOLOGY TREATMENT NOTE ? ? ?Patient Name: Denise [name] ?MRN: [id number] ?DOB:17-Dec-1950, 71 y.o., female ?Today's Date: 10/28/2021 ? ?PCP: Minette Brine, FNP ?REFERRING PROVIDER: Melvenia Beam, MD  ? ?END OF SESSION:  ? End of Session - 10/28/21 1449   ? ? Visit Number 4   ? Number of Visits 17   ? Date for SLP Re-Evaluation 12/10/21   ? Authorization Type BCBS Medicare   ? SLP Start Time 4944   arrived late/restroom break  ? SLP Stop Time  1530   ? SLP Time Calculation (min) 35 min   ? Activity Tolerance Patient tolerated treatment well   ? ?  ?  ? ?  ? ? ?Past Medical History:  ?Diagnosis Date  ? Chronic kidney disease   ? as a child, no present issues   ? Glaucoma   ? Osteopenia 06/2018  ? T score -1.9 FRAX 3% / 0.2%  ? Tuberculosis   ? 6th grade  ? ?Past Surgical History:  ?Procedure Laterality Date  ? TUBAL LIGATION    ? ?Patient Active Problem List  ? Diagnosis Date Noted  ? Parkinson's disease ([place]) 02/17/2020  ? Gait abnormality 12/04/2019  ? Prediabetes 12/03/2018  ? Vitamin D deficiency 12/03/2018  ? Anemia due to vitamin B12 deficiency 12/03/2018  ? Fatigue 12/03/2018  ? Urinary frequency 10/01/2018  ? Vitamin B12 deficiency 10/01/2018  ? 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  ? 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 Abnormal auditory perception of both ears 05/10/2016  ? Osteopenia 08/23/2012  ? ? ?ONSET DATE: Diagnosed ~2 years ago  ? ?REFERRING DIAG: G20 (ICD-10-CM) - Parkinson's disease  ? ?THERAPY DIAG: Dysarthria and anarthria ? ?SUBJECTIVE: "I did the online practice"  ? ?PAIN:  ?Are you having pain? No ? ?OBJECTIVE:  ? ?TODAY'S TREATMENT:  ?10-28-21: Pt entered with mildly reduced sub-optimal conversational volume averaging upper 60s dB. Conducted ongoing education and instruction of [facility] Out! Program for Parkinson's Disease. Targeted improving vocal quality and increasing intensity through progressively difficulty speech tasks. Conducted Lesson #*5 today. ST  leads pt through exercises providing occasional model to aid pt execution. Rare min-A required to achieve targeted dB this date. Averaged this date: loud "ah" 89 dB; reading 77 dB; cognitive speech task 76 dB. Subsequent conversational sample of approx 5 minutes, pt averaged low 70s dB with occasional min-A to maintain targeted vocal intensity as conversation progressed. ? ?10-26-21: Pt entered with sub-optimal conversational volume averaging mid 60s dB. Conducted ongoing education and instruction of [facility]! Program for Parkinson's Disease. Targeted improving vocal quality and increasing intensity through progressively difficulty speech tasks. Conducted Lesson #4 today. ST leads pt through exercises providing occasional model to aid pt execution. Rare min-A required to achieve targeted dB this date. Averaged this date: loud "ah" 90 dB; reading 77 dB; cognitive speech task 75 dB. Subsequent conversational sample of approx 5 minutes, pt averaged low 70s dB with rare min-A to maintain targeted vocal intensity as conversation progressed. ? ?10-19-21: Initiated education and instruction of [facility] Out! Program for Parkinson's Disease. Targeted improving vocal quality and increasing intensity through progressively difficulty speech tasks. Conducted Lesson #1 today. ST leads pt through exercises providing usual model prior to pt execution. Occasional fading to rare min-A required to achieve targeted dB this date. Averaged this date: loud "ah" 90 dB; reading 78 dB; cognitive speech task 76 dB. Subsequent conversational sample of approx 5 minutes, pt averaged low 70s dB with occasional min-A to maintain targeted vocal  intensity as conversation progressed. ? ?10-12-21: Educated patient on clinical observations and recommendations for initiation of ST services to maximize vocal intensity and clarity due to reduced volume in conversation. Provided education and handout re: [facility] Program, in which pt will plan to watch  webinar and order workbook before next ST session.  ?  ?  ?PATIENT EDUCATION: ?Education details: see above ?Person educated: Patient ?Education method: Explanation, Demonstration, and Handouts ?Education comprehension: verbalized understanding, returned demonstration, and needs further education ?  ?  ?HOME EXERCISE PROGRAM: ?Speak Out! Program  ?  ?  ?  ?  ?GOALS: ?Goals reviewed with patient? Yes ?  ?SHORT TERM GOALS: Target date: 11/12/2021 ?  ?Pt will complete HEP given occasional min A over 3 sessions ?Baseline: 10-26-21, 10-28-21 ?Goal status: ongoing ?  ?2.  Pt will utilize dysarthria compensations on structured speech tasks with 80% accuracy given occasional min A over 3 sessions ?Baseline: 10-26-21, 10-28-21 ?Goal status: ongoing ?  ?3.  Pt will maintain WNL conversational volume (70-72 dB) in 5-10 minute conversation given occasional min A over 2 sessions ?Baseline: 10-26-21 ?Goal status: ongoing ?  ?4.  Pt will utilize memory/attention compensations as needed to optimize daily functioning and reduce caregiver burden given occasional min A over 2 sessions ?Baseline:  ?Goal status: ongoing ?  ?  ?LONG TERM GOALS: Target date: 12/10/2021 ?  ?Pt will complete HEP at least 1x/day (recommend BID) > 1 week ?Baseline:  ?Goal status: ongoing ?  ?2.  Pt will maintain WNL conversational volume (70-72 dB) in 10+ minute conversation given occasional min A over 2 sessions ?Baseline:  ?Goal status: ongoing ?  ?3.  Pt will carryover memory/attention compensations as needed to optimize daily functioning and reduce caregiver burden given rare min A > 1 week ?Baseline:  ?Goal status: ongoing ?  ?4.  Pt will report improved communication effectiveness via PROM with 2 point increase by last ST session  ?Baseline: CPIB=21 ?Goal status: ongoing ?  ?ASSESSMENT: ?  ?CLINICAL IMPRESSION: ?Patient is a 71 y.o. 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 Conducted  education and training of [facility]! Program to optimize vocal intensity and clarity. Pt able to achieve targeted dB with rare min A on structured tasks and occasional min A in conversation. Continue education and training of program to optimize patient understanding and carryover in conversation.  ?  ?OBJECTIVE IMPAIRMENTS  ?Objective impairments include memory and dysarthria. These impairments are limiting patient from household responsibilities and effectively communicating at home and in community.Factors affecting potential to achieve goals and functional outcome are medical prognosis. Patient will benefit from skilled SLP services to address above impairments and improve overall function. ?  ?REHAB POTENTIAL: Good ?  ?PLAN: ?SLP FREQUENCY: 2x/week ?  ?SLP DURATION: 8 weeks ?  ?PLANNED INTERVENTIONS: Language facilitation, Cueing hierachy, Cognitive reorganization, Internal/external aids, Functional tasks, Multimodal communication approach, SLP instruction and feedback, Compensatory strategies, and Patient/family education ?  ? ? ?Marzetta Board, CCC-SLP ?10/28/2021, 3:32 PM ? ? ?

## 2021-11-01 ENCOUNTER — Ambulatory Visit: Payer: Medicare Other | Attending: Nurse Practitioner | Admitting: Occupational Therapy

## 2021-11-01 ENCOUNTER — Encounter: Payer: Self-pay | Admitting: Occupational Therapy

## 2021-11-01 ENCOUNTER — Ambulatory Visit: Payer: Medicare Other | Admitting: Physical Therapy

## 2021-11-01 DIAGNOSIS — R2689 Other abnormalities of gait and mobility: Secondary | ICD-10-CM | POA: Diagnosis not present

## 2021-11-01 DIAGNOSIS — M25612 Stiffness of left shoulder, not elsewhere classified: Secondary | ICD-10-CM | POA: Insufficient documentation

## 2021-11-01 DIAGNOSIS — R293 Abnormal posture: Secondary | ICD-10-CM | POA: Insufficient documentation

## 2021-11-01 DIAGNOSIS — R29818 Other symptoms and signs involving the nervous system: Secondary | ICD-10-CM | POA: Insufficient documentation

## 2021-11-01 DIAGNOSIS — R471 Dysarthria and anarthria: Secondary | ICD-10-CM | POA: Insufficient documentation

## 2021-11-01 DIAGNOSIS — R278 Other lack of coordination: Secondary | ICD-10-CM | POA: Insufficient documentation

## 2021-11-01 DIAGNOSIS — R2681 Unsteadiness on feet: Secondary | ICD-10-CM

## 2021-11-01 DIAGNOSIS — M6281 Muscle weakness (generalized): Secondary | ICD-10-CM | POA: Diagnosis not present

## 2021-11-01 DIAGNOSIS — R29898 Other symptoms and signs involving the musculoskeletal system: Secondary | ICD-10-CM | POA: Insufficient documentation

## 2021-11-01 DIAGNOSIS — R41841 Cognitive communication deficit: Secondary | ICD-10-CM | POA: Diagnosis not present

## 2021-11-01 NOTE — Therapy (Signed)
?OUTPATIENT OCCUPATIONAL THERAPY TREATMENT NOTE ? ? ?Patient Name: Denise [name] ?MRN: [id number] ?DOB:1950-12-05, 71 y.o., female ?Today's Date: 11/01/2021 ? ?PCP: Minette Brine, FNP ?REFERRING PROVIDER:  Dr. Jaynee Eagles  ? ?END OF SESSION:  ? OT End of Session - 11/01/21 1321   ? ? Visit Number 4   ? Number of Visits 9   ? Date for OT Re-Evaluation 12/14/21   ? Authorization Type BCBS Medicare:  no auth, no visit limit $40 copay-   ? Authorization Time Period 8 weeks, however pt is only scheduled for 6 weeks   ? Authorization - Visit Number 4   ? Authorization - Number of Visits 10   ? Progress Note Due on Visit 10   ? OT Start Time 1319   ? OT Stop Time 1400   ? OT Time Calculation (min) 41 min   ? Activity Tolerance Patient tolerated treatment well   ? Behavior During Therapy [facility] for tasks assessed/performed   ? ?  ?  ? ?  ? ? ? ?Past Medical History:  ?Diagnosis Date  ? Chronic kidney disease   ? as a child, no present issues   ? Glaucoma   ? Osteopenia 06/2018  ? T score -1.9 FRAX 3% / 0.2%  ? Tuberculosis   ? 6th grade  ? ?Past Surgical History:  ?Procedure Laterality Date  ? TUBAL LIGATION    ? ?Patient Active Problem List  ? Diagnosis Date Noted  ? Parkinson's disease ([place]) 02/17/2020  ? Gait abnormality 12/04/2019  ? Prediabetes 12/03/2018  ? Vitamin D deficiency 12/03/2018  ? Anemia due to vitamin B12 deficiency 12/03/2018  ? Fatigue 12/03/2018  ? Urinary frequency 10/01/2018  ? Vitamin B12 deficiency 10/01/2018  ? 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  ? 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 Abnormal auditory perception of both ears 05/10/2016  ? Osteopenia 08/23/2012  ? ? ?ONSET DATE: 09/27/21   ? ?REFERRING DIAG: 09/27/21  ? ?THERAPY DIAG: Parkinson's disease ?Other symptoms and signs involving the musculoskeletal system ? ?Muscle weakness (generalized) ? ?Other lack of coordination ? ?Other symptoms and signs involving the nervous system ? ?Stiffness of left shoulder, not elsewhere  classified ? ?Unsteadiness on feet ? ?Abnormal posture ? ? ?PERTINENT HISTORY: returns to occupational therapy for Parkinson's disease for follow up eval as recommended when last d/c. PMH: ostopenia, glaucoma ? ?PRECAUTIONS: fall ? ?SUBJECTIVE: The magazine pages were thicker when I practiced so it was easier.   ? ?PAIN:  ?Are you having pain? No ? ? ? ? ?TODAY'S TREATMENT: ? ?Sitting closed-chain shoulder flex (floor>ceiling) and diagonals with BUEs with min v.c. for large amplitude movements. ? ?Reviewed coordination HEP with each hand with min cueing for large amplitude movements and posture:  flipping cards while incorporated bilateral hand coordination as able (for timing/incr difficulty--performed with BUEs simultaneously), Dealing cards with thumb, stacking coins, and manipulating coins in hand to place in container (performed with both hands simultaneously), rotating ball in fingertips (performed with both hands simultaneously), tossing ball. ? ?Discussed/recommended carrying small wallet or cross body bag vs purse on shoulder to help with posture/arm swing. ? ?Tossing/catching 1 ball in each hand simultaneously with min cueing/difficulty.   ? ?Turning pages of magazine with large amplitude movements with min cueing. ? ? ?  ?PATIENT EDUCATION: ?Education details: pt instructed to check posture with coordination activities (may help to set items further away from her), may incorporate performing coordination activities simultaneously with [place] for improved timing and bilateral hand coordination.  (  Also for eating). ?Person educated:  patient ?Education method:  explanation, demo ?Education comprehension:  verbalized understanding, returned demo, cueing needed ? ?ASSESSMENT: ?  ?CLINICAL IMPRESSION: ?Pt responds well to cueing for large amplitude movements.  Pt demo improved postural alignment with gross motor tasks, but continues to need cueing for fine motor tasks. ?  ?PERFORMANCE DEFICITS in functional  skills including ADLs, IADLs, coordination, dexterity, ROM, strength, flexibility, FMC, GMC, mobility, balance, endurance, decreased knowledge of precautions, decreased knowledge of use of DME, and UE functional use, cognitive skills including attention, memory, problem solving, safety awareness, and thought, and psychosocial skills including coping strategies, environmental adaptation, and routines and behaviors.  ?  ?IMPAIRMENTS are limiting patient from ADLs, IADLs, play, leisure, and social participation.  ?  ?COMORBIDITIES may have co-morbidities  that affects occupational performance. Patient will benefit from skilled OT to address above impairments and improve overall function. ?  ?MODIFICATION OR ASSISTANCE TO COMPLETE EVALUATION: No modification of tasks or assist necessary to complete an evaluation. ?  ?OT OCCUPATIONAL PROFILE AND HISTORY: Problem focused assessment: Including review of records relating to presenting problem. ?  ?CLINICAL DECISION MAKING: LOW - limited treatment options, no task modification necessary ?  ?REHAB POTENTIAL: Good ?  ?EVALUATION COMPLEXITY: Low ?  ?   ?  ?GOALS: ?  ?  ?SHORT TERM GOALS: Target date: 11/09/2021 ?  ?I with PD specific HEP ? Goal status: INITIAL ?  ?2.  Pt will verbalize understanding of adapted strategies to maximize safety and I with ADLs/ IADLs . ? Goal status: INITIAL ?  ?3.  Pt will demonstrate improved fine motor coordination for ADLs as evidenced by decreasing 9 hole peg test score for LUE by 3 secs ?  ?Baseline: R 23.31, L 35.72 ?Goal status: INITIAL ?  ?4.  Pt will verbalize understanding of ways to prevent future PD related complications  ?  ?Goal status: INITIAL ?  ?  ?LONG TERM GOALS: Target date: 12/14/21 anticipate d/c after 6 weeks dependent on progress ?  ?Pt will perform functional activity for at least 6 min while maintaining upright posture/avoiding leaning to L.  ?Baseline: pt leans to left side ?Goal status: INITIAL ?  ?2.  Pt will demonstrate  improved ease with fastening buttons as evidenced by decreasing 3 button/ unbutton time to :39 secs or less ?  ?  ?Baseline: 42.94 ?Goal status: INITIAL ?  ?3.  Pt will demonstrate ability to retrieve a lightweight object at 140 shoulder flexion and -5 elbow extension with LUE ?  ?Baseline: LUE 130, -10 ?Goal status: INITIAL ?  ?4. Pt will demonstrate improved LUE functional use for ADLs as evidenced by increasing box/ blocks score by 3 blocks with LUE. ?Baseline: RUE 52, LUE 48 ?Goal status: inital ?  ?PLAN: ?OT FREQUENCY: 1x/week ?  ?OT DURATION: 8 weeks plus eval  ?  ?PLANNED INTERVENTIONS: self care/ADL training, therapeutic exercise, therapeutic activity, neuromuscular re-education, manual therapy, passive range of motion, gait training, balance training, functional mobility training, splinting, ultrasound, paraffin, fluidotherapy, moist heat, patient/family education, cognitive remediation/compensation, energy conservation, coping strategies training, and DME and/or AE instructions ?  ?RECOMMENDED OTHER SERVICES: n/a ?  ?CONSULTED AND AGREED WITH PLAN OF CARE: Patient ?  ?PLAN FOR NEXT SESSION:   functional reaching, review strategies for ADLs, check goals/?d/c OT (schedule follow-up) ? ? ?[facility] [place] ?11/01/2021, 1:45 PM ? ? ?Vianne Bulls, OTR/L ?[place] ?[place], [place]  [address] ?[phone] phone ?[phone] ?11/01/21 1:45 PM ? ? ?  ? ? ? ?

## 2021-11-01 NOTE — Therapy (Signed)
?OUTPATIENT PHYSICAL THERAPY TREATMENT NOTE ? ? ?Patient Name: Ellason Segar ?MRN: [id number] ?DOB:11/28/1950, 71 y.o., female ?Today's Date: 11/01/2021 ? ?PCP: Minette Brine, FNP ?REFERRING PROVIDER: Dr. Jaynee Eagles ? ?  ? PT End of Session - 11/01/21 1404   ? ? Visit Number 4   ? Number of Visits 13   ? Date for PT Re-Evaluation 01/11/22   due to delay in scheduling  ? Authorization Type BCBS Medicare   ? PT Start Time 1402   ? PT Stop Time 5361   ? PT Time Calculation (min) 42 min   ? Equipment Utilized During Treatment Gait belt   ? Activity Tolerance Patient tolerated treatment well   ? Behavior During Therapy [facility] for tasks assessed/performed   ? ?  ?  ? ?  ? ? ? ?Past Medical History:  ?Diagnosis Date  ? Chronic kidney disease   ? as a child, no present issues   ? Glaucoma   ? Osteopenia 06/2018  ? T score -1.9 FRAX 3% / 0.2%  ? Tuberculosis   ? 6th grade  ? ?Past Surgical History:  ?Procedure Laterality Date  ? TUBAL LIGATION    ? ?Patient Active Problem List  ? Diagnosis Date Noted  ? Parkinson's disease ([place]) 02/17/2020  ? Gait abnormality 12/04/2019  ? Prediabetes 12/03/2018  ? Vitamin D deficiency 12/03/2018  ? Anemia due to vitamin B12 deficiency 12/03/2018  ? Fatigue 12/03/2018  ? Urinary frequency 10/01/2018  ? Vitamin B12 deficiency 10/01/2018  ? 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  ? 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 Abnormal auditory perception of both ears 05/10/2016  ? Osteopenia 08/23/2012  ? ? ?REFERRING DIAG: G20 (ICD-10-CM) - Parkinson's disease ([place])  ? ?THERAPY DIAG:  ?Other abnormalities of gait and mobility ? ?Muscle weakness (generalized) ? ?Unsteadiness on feet ? ?PERTINENT HISTORY: PD, glaucoma, osteoporosis (newly diagnosed), TB (as a child)  ? ?T-score for left hip was - 2.7 done in January 2023 ? ?PRECAUTIONS: Fall, no driving  ? ?SUBJECTIVE: No new changes to report, has been doing her HEP regularly.  ? ?PAIN:  ?Are you having pain? No ? ? ?TODAY'S TREATMENT:  ?Ther Ex ?SciFit  level 3 for 8 minutes with use of BLE/BUEs for neural priming for reciprocal movement, dynamic cardiovascular warmup and endurance. Min verbal cues for improved knee extension of RLE.  ? ?NMR  ?Reviewed seated PWR! Step for improved step initiation ("out,out, in, in"). Pt reports she forgets how to move her legs after a few minutes.  ? ?Sit <>stand w/yellow theraband pull apart x25 for improved posture, anterior weight shift and high amplitude hip extension. Pt initially pulled band slowly but with min cues, increased speed of stand and pull.  ? ?Alt fwd lunge w/OH slam ball using 1kg ball for BLE coordination, dynamic standing balance, lateral weight shifting and postural control. Pt performed x12 per side and was able to self-correct amplitude of OH throw without need for cues. Noted poor step clearance despite mod verbal cues, so added 6" foam beam for pt to step over to promote improved step clearance. Pt very hesitant to step over foam and required min A when retro stepping w/LLE due to poor lateral weight shift to R side. Pt then able to perform 4 reps (2 per side) w/CGA.  ? ?  ?PATIENT EDUCATION: ?Education details: how to manage freezing episodes, importance of lateral weight shifting, continue HEP  ?Person educated: Patient ?Education method: Explanation/Demo ?Education comprehension: verbalized and demonstrated understanding ?  ?  ?  HOME EXERCISE PROGRAM: ?Standing and seated PWR, Pt has in her exercise folder.  ? ? Access Code: [email or web address] ?URL: [email or web address] ?Date: 10/19/2021 ?Prepared by: [name] ? ?Reviewed/condensed exercises for balance:  ? ?Exercises ?- Alternating Step Backward with Support  - 1 x daily - 5 x weekly - 1-2 sets - 10 reps ?- Romberg Stance Eyes Closed on Foam Pad  - 1 x daily - 5 x weekly - 3 sets - 30 hold ? ?Plus standing tall against the wall for posture - 2 x 30 second holds  ?  ?  ?  ?GOALS: ?Goals reviewed with patient? Yes ?  ?SHORT TERM GOALS: Target  date: 11/10/2021 ?  ?Pt will improve 5x sit<>stand to less than or equal to 13.5 sec w/o UE support to demonstrate improved functional strength and transfer efficiency.  ?  ?Baseline: 15.94 seconds ?Goal status: INITIAL ?  ?2.  Pt will improve manual TUG time to 13.5 seconds or less in order to demo decrease fall risk. ?  ?Baseline: 15.4 sec ?Goal status: INITIAL ?  ?3.  Pt will undergo further assessment of 6MWT with LTG written.      ?Baseline: 1217' with RPE 8/10       ?Goal status: MET ?  ?  ?  ?LONG TERM GOALS: Target date: 12/08/2021 ?  ?           Pt will be independent with final PD HEP for improved strength, balance, transfers, and gait ?Baseline:  ?Goal status: INITIAL ?  ?2.  Pt will improve MiniBESTest score to at least 22/28 for decreased fall risk.  ?  ?Baseline: 18/28 ?Goal status: INITIAL ?  ?3.  Pt will improve TUG cognitive score to less than or equal to 15 seconds for decreased fall risk and improved dual tasking.  ?  ?Baseline: 18.75 sec ?Goal status: INITIAL ?  ?4.  Pt will improve 6MWT to at least 1250' with improved gait mechanics (arm swing, posture, and incr heel strike) and RPE to 6/10 or less in order to demo improved gait efficiency/endurance. ?Baseline: 1217' with RPE 8/10 ?Goal status: INITIAL ?  ?5.  Pt will improve gait speed with no AD to at least 3.2 ft/sec in order to demo improved community mobility.  ?  ?Baseline: 2.77 ft/sec ?Goal status: INITIAL ?  ?  ?ASSESSMENT: ?  ?CLINICAL IMPRESSION: ?Emphasis of skilled PT session on improved lateral weight shifting, step clearance and postural control. Reviewed strategies to combat freezing episodes and educated pt on importance of lateral weight shifting. Pt demonstrates adequate step clearance when stepping fwd, but significant difficulty with retro stepping requiring min tactile cues to facilitate lateral weight shift. Continue POC.  ?  ?  ?  ?OBJECTIVE IMPAIRMENTS Abnormal gait, decreased activity tolerance, decreased balance, decreased  coordination, decreased endurance, decreased strength, impaired flexibility, and postural dysfunction.  ?  ?ACTIVITY LIMITATIONS community activity and driving.  ?  ?PERSONAL FACTORS Past/current experiences, Time since onset of injury/illness/exacerbation, and 1 comorbidity: osteoporosis  are also affecting patient's functional outcome.  ?  ?  ?REHAB POTENTIAL: Good ?  ?CLINICAL DECISION MAKING: Stable/uncomplicated ?  ?EVALUATION COMPLEXITY: Low ?  ?PLAN: ?PT FREQUENCY: 2x/week ?  ?PT DURATION: 12 weeks ?  ?PLANNED INTERVENTIONS: Therapeutic exercises, Therapeutic activity, Neuromuscular re-education, Balance training, Gait training, Patient/Family education, Stair training, and DME instructions ?  ?PLAN FOR NEXT SESSION: Review seated PWR moves if needed for HEP. Pt will probably bring in her exercise folder. GAIT TRAINING WITH POSTURE, HEEL STRIKE/ARM  SWING. Treadmill training, obstacle navigation, retro gait. Work on larger amplitude movements. Balance on unlevel surfaces. SLS. UE/LE coordination, dual-tasks (cog/motor)  ?  ? ?Cruzita Lederer Yarnell Arvidson, PT, DPT  ?11/01/2021, 2:52 PM ? ?  ? ?

## 2021-11-01 NOTE — Patient Instructions (Signed)
? ? ?  To help with posture:   ? ?When sitting at the table, try to scoot plate/items out away from you a little bit to help you keep your head/chest up more. ?When eating, bring left arm to rest on table or hold plate. ?When doing coordination/hand exercises, try to perform with both hands at the same time (flipping cards, stacking coins, rotating ball, etc.) ? ?

## 2021-11-02 ENCOUNTER — Encounter: Payer: Self-pay | Admitting: Nurse Practitioner

## 2021-11-02 ENCOUNTER — Ambulatory Visit (INDEPENDENT_AMBULATORY_CARE_PROVIDER_SITE_OTHER): Payer: Medicare Other | Admitting: Nurse Practitioner

## 2021-11-02 VITALS — BP 122/80 | HR 83 | Temp 97.7°F | Ht 63.0 in | Wt 130.6 lb

## 2021-11-02 DIAGNOSIS — R7303 Prediabetes: Secondary | ICD-10-CM

## 2021-11-02 DIAGNOSIS — M810 Age-related osteoporosis without current pathological fracture: Secondary | ICD-10-CM

## 2021-11-02 DIAGNOSIS — Z0000 Encounter for general adult medical examination without abnormal findings: Secondary | ICD-10-CM | POA: Diagnosis not present

## 2021-11-02 DIAGNOSIS — I2510 Atherosclerotic heart disease of native coronary artery without angina pectoris: Secondary | ICD-10-CM

## 2021-11-02 DIAGNOSIS — Z79899 Other long term (current) drug therapy: Secondary | ICD-10-CM

## 2021-11-02 DIAGNOSIS — G20A1 Parkinson's disease without dyskinesia, without mention of fluctuations: Secondary | ICD-10-CM

## 2021-11-02 DIAGNOSIS — G20 Parkinson's disease: Secondary | ICD-10-CM

## 2021-11-02 DIAGNOSIS — R001 Bradycardia, unspecified: Secondary | ICD-10-CM

## 2021-11-02 LAB — POCT URINALYSIS DIPSTICK
Bilirubin, UA: NEGATIVE
Glucose, UA: NEGATIVE
Ketones, UA: NEGATIVE
Nitrite, UA: NEGATIVE
Protein, UA: NEGATIVE
Spec Grav, UA: 1.02 (ref 1.010–1.025)
Urobilinogen, UA: 1 E.U./dL
pH, UA: 7 (ref 5.0–8.0)

## 2021-11-02 NOTE — Patient Instructions (Signed)
?  Ms. Pillard , ?Thank you for taking time to come for your Medicare Wellness Visit. I appreciate your ongoing commitment to your health goals. Please review the following plan we discussed and let me know if I can assist you in the future.  ? ?These are the goals we discussed: ? Goals   ? ?  Patient Stated   ?  "If I can continue the regimen I have now for the Parkinson's I will be okay" ?  ? ?  ?  ?This is a list of the screening recommended for you and due dates:  ?Health Maintenance  ?Topic Date Due  ? Flu Shot  02/01/2022  ? Mammogram  06/21/2022  ? Colon Cancer Screening  07/09/2025  ? Tetanus Vaccine  05/02/2027  ? Pneumonia Vaccine  Completed  ? DEXA scan (bone density measurement)  Completed  ? COVID-19 Vaccine  Completed  ? Hepatitis C Screening: USPSTF Recommendation to screen - Ages 70-79 yo.  Completed  ? Zoster (Shingles) Vaccine  Completed  ? HPV Vaccine  Aged Out  ? ? ?

## 2021-11-02 NOTE — Progress Notes (Signed)
?I,Victoria T Hamilton,acting as a scribe for Minette Brine, FNP.,have documented all relevant documentation on the behalf of Minette Brine, FNP,as directed by  Minette Brine, FNP while in the presence of Minette Brine, [place].  ?This visit occurred during the SARS-CoV-2 public health emergency.  Safety protocols were in place, including screening questions prior to the visit, additional usage of staff PPE, and extensive cleaning of exam room while observing appropriate contact time as indicated for disinfecting solutions. ? ?Subjective:  ?  ? Patient ID: Denise [name] , female    DOB: 04-25-1951 , 71 y.o.   MRN: [id number] ? ? ?Chief Complaint  ?Patient presents with  ? welcome to medicare  ? ? ?HPI ? ?Patient presents today with her daughter for WTM.  She is doing a Speak out program for her Parkinsons for speech therapy.   ?  ? ?Past Medical History:  ?Diagnosis Date  ? Chronic kidney disease   ? as a child, no present issues   ? Glaucoma   ? Osteopenia 06/2018  ? T score -1.9 FRAX 3% / 0.2%  ? Tuberculosis   ? 6th grade  ?  ? ?Family History  ?Problem Relation Age of Onset  ? Hypertension Mother   ? Heart disease Mother   ? Diabetes Father   ? Hypertension Father   ? Other Father   ?     had a condition that was "a cousin" to ALS   ? Diabetes Sister   ? Tuberculosis Maternal Grandmother   ? Heart disease Sister   ? Colitis Daughter   ? Colon cancer Neg Hx   ? Colon polyps Neg Hx   ? Esophageal cancer Neg Hx   ? Rectal cancer Neg Hx   ? Stomach cancer Neg Hx   ? ? ? ?Current Outpatient Medications:  ?  alendronate (FOSAMAX) 70 MG tablet, Take 1 tablet (70 mg total) by mouth every 7 (seven) days. Take with a full glass of water on an empty stomach., Disp: 4 tablet, Rfl: 3 ?  atorvastatin (LIPITOR) 10 MG tablet, TAKE 1 TABLET BY MOUTH EVERY DAY AT BEDTIME MONDAY THROUGH FRIDAY, Disp: 65 tablet, Rfl: 4 ?  Calcium Carb-Cholecalciferol (OYSTER SHELL CALCIUM/VITAMIN D) 500-200 MG-UNIT PACK, Take by mouth., Disp: , Rfl:  ?   carbidopa-levodopa (SINEMET IR) 25-100 MG tablet, Take at 8,12 and 4pm. Try to avoid protein. (Patient taking differently: Take at 6am,12 and 6pm. Try to avoid protein.), Disp: 270 tablet, Rfl: 4 ?  hydrochlorothiazide (HYDRODIURIL) 12.5 MG tablet, TAKE 1 TABLET BY MOUTH DAILY AS NEEDED FOR LEG SWELLING, Disp: 90 tablet, Rfl: 1 ?  timolol (BETIMOL) 0.25 % ophthalmic solution, 1-2 drops daily. , Disp: , Rfl:  ?  Travoprost, BAK Free, (TRAVATAN) 0.004 % SOLN ophthalmic solution, 1 drop at bedtime., Disp: , Rfl:  ?  Vitamin D, Ergocalciferol, (DRISDOL) 1.25 MG (50000 UNIT) CAPS capsule, TAKE 1 CAPSULE (50,000 UNITS TOTAL) BY MOUTH EVERY 7 (SEVEN) DAYS, Disp: 12 capsule, Rfl: 0  ? ?Allergies  ?Allergen Reactions  ? Nystatin-Triamcinolone Swelling  ?  BLISTERS  ? Triamcinolone Acetonide   ?  Blisters   ? Cephalexin Itching and Rash  ?  ? ?Review of Systems  ?Constitutional: Negative.   ?Eyes: Negative.   ?Respiratory: Negative.    ?Cardiovascular: Negative.  Negative for chest pain, palpitations and leg swelling.  ?Endocrine: Negative.   ?Genitourinary: Negative.   ?Musculoskeletal: Negative.   ?Allergic/Immunologic: Negative.   ?Neurological: Negative.   ?Psychiatric/Behavioral: Negative.     ? ?  Today's Vitals  ? 11/02/21 1550  ?BP: 122/80  ?Pulse: 83  ?Temp: 97.7 ?F (36.5 ?C)  ?SpO2: 99%  ?Weight: 130 lb 9.6 oz (59.2 kg)  ?Height: '5\' 3"'  (1.6 m)  ?PainSc: 0-No pain  ? ?Body mass index is 23.13 kg/m?.  ? ?Objective:  ?Physical Exam ?Constitutional:   ?   General: She is not in acute distress. ?   Appearance: Normal appearance. She is well-developed and normal weight.  ?   Comments: Thin   ?HENT:  ?   Head: Normocephalic and atraumatic.  ?   Right Ear: Hearing, tympanic membrane, ear canal and external ear normal. There is no impacted cerumen.  ?   Left Ear: Hearing, tympanic membrane, ear canal and external ear normal. There is no impacted cerumen.  ?   Nose:  ?   Comments: Deferred - masked ?   Mouth/Throat:  ?    Comments: Deferred - masked ?Eyes:  ?   General: Lids are normal.  ?   Extraocular Movements: Extraocular movements intact.  ?   Conjunctiva/sclera: Conjunctivae normal.  ?   Pupils: Pupils are equal, round, and reactive to light.  ?   Funduscopic exam: ?   Right eye: No papilledema.     ?   Left eye: No papilledema.  ?Neck:  ?   Thyroid: No thyroid mass.  ?   Vascular: No carotid bruit.  ?Cardiovascular:  ?   Rate and Rhythm: Normal rate and regular rhythm.  ?   Pulses: Normal pulses.  ?   Heart sounds: Normal heart sounds. No murmur heard. ?Pulmonary:  ?   Effort: Pulmonary effort is normal. No respiratory distress.  ?   Breath sounds: Normal breath sounds. No wheezing.  ?Chest:  ?   Chest wall: No mass.  ?Breasts: ?   Tanner Score is 5.  ?   Right: Normal. No mass or tenderness.  ?   Left: Normal. No mass or tenderness.  ?Abdominal:  ?   General: Abdomen is flat. Bowel sounds are normal. There is no distension.  ?   Palpations: Abdomen is soft.  ?   Tenderness: There is no abdominal tenderness.  ?Musculoskeletal:     ?   General: No swelling or tenderness. Normal range of motion.  ?   Cervical back: Full passive range of motion without pain, normal range of motion and neck supple.  ?   Right lower leg: No edema.  ?   Left lower leg: No edema.  ?   Comments: She is wearing support socks, gait is improved  ?Lymphadenopathy:  ?   Upper Body:  ?   Right upper body: No supraclavicular, axillary or pectoral adenopathy.  ?   Left upper body: No supraclavicular, axillary or pectoral adenopathy.  ?Skin: ?   General: Skin is warm and dry.  ?   Capillary Refill: Capillary refill takes less than 2 seconds.  ?Neurological:  ?   General: No focal deficit present.  ?   Mental Status: She is alert and oriented to person, place, and time.  ?   Cranial Nerves: No cranial nerve deficit.  ?   Sensory: No sensory deficit.  ?   Motor: No weakness.  ?Psychiatric:     ?   Mood and Affect: Mood normal.     ?   Behavior: Behavior normal.      ?   Thought Content: Thought content normal.     ?   Judgment: Judgment normal.  ?  ? ?   ?  Assessment And Plan:  ?   ?1. Medicare welcome exam ?Pt's annual wellness exam was performed and geriatric assessment reviewed.  ?Pt has no new identiafble wellness concerns at this time.  ?WIll obtain routine labs.  ?Will obtain UA and micro.  ?Behavior modifications discussed and diet history reviewed. Pt will continue to exercise regularly and modify diet, with low GI, plant based foods and decrease food intake of processed foods.  ?Recommend intake of daily multivitamin, Vitamin D, and calcium. ?Recommend mammogram and colonoscopy for preventive screenings, as well as recommend immunizations that include influenza (up to date) and TDAP (up to date) ? ?- POCT Urinalysis Dipstick (81002) ?- Microalbumin / creatinine urine ratio ?- EKG 12-Lead ? ?2. Age-related osteoporosis without current pathological fracture ?Continue fosamax ? ?3. Prediabetes ?Comments: Diet controlled. Continue with regular exercise ?EKG done with sinus bradycardia HR 50 ?- POCT Urinalysis Dipstick (81002) ?- Microalbumin / creatinine urine ratio ?- EKG 12-Lead ?- CMP14+EGFR ?- CBC ?- Hemoglobin A1c ?- Lipid panel ? ?4. Atherosclerotic cardiovascular disease ?Comments: Statin, tolerating well.  ? ?5. Parkinson's disease ([place]) ?Comments: Continue follow up with Neurology and continue with your exercise program ? ?6. Bradycardia ?Comments: Heart rate on EKG is 50, she has had several times but not seen by a Cardiologist. Intemittent episodes of gait imbalance but nothing significant ?- Ambulatory referral to Cardiology ? ?7. Other long term (current) drug therapy ?  ? ? ?Patient was given opportunity to ask questions. Patient verbalized understanding of the plan and was able to repeat key elements of the plan. All questions were answered to their satisfaction.  ?Minette Brine, FNP  ? ?I, Minette Brine, FNP, have reviewed all documentation for this visit.  The documentation on 11/02/21 for the exam, diagnosis, procedures, and orders are all accurate and complete.  ? ?IF YOU HAVE BEEN REFERRED TO A SPECIALIST, IT MAY TAKE 1-2 WEEKS TO SCHEDULE/PROCESS THE REFER

## 2021-11-03 ENCOUNTER — Ambulatory Visit: Payer: Medicare Other | Admitting: Physical Therapy

## 2021-11-03 ENCOUNTER — Ambulatory Visit: Payer: Medicare Other

## 2021-11-03 DIAGNOSIS — R41841 Cognitive communication deficit: Secondary | ICD-10-CM | POA: Diagnosis not present

## 2021-11-03 DIAGNOSIS — R29818 Other symptoms and signs involving the nervous system: Secondary | ICD-10-CM | POA: Diagnosis not present

## 2021-11-03 DIAGNOSIS — R2681 Unsteadiness on feet: Secondary | ICD-10-CM

## 2021-11-03 DIAGNOSIS — M6281 Muscle weakness (generalized): Secondary | ICD-10-CM

## 2021-11-03 DIAGNOSIS — R293 Abnormal posture: Secondary | ICD-10-CM | POA: Diagnosis not present

## 2021-11-03 DIAGNOSIS — R471 Dysarthria and anarthria: Secondary | ICD-10-CM | POA: Diagnosis not present

## 2021-11-03 DIAGNOSIS — R2689 Other abnormalities of gait and mobility: Secondary | ICD-10-CM | POA: Diagnosis not present

## 2021-11-03 DIAGNOSIS — M25612 Stiffness of left shoulder, not elsewhere classified: Secondary | ICD-10-CM | POA: Diagnosis not present

## 2021-11-03 DIAGNOSIS — R29898 Other symptoms and signs involving the musculoskeletal system: Secondary | ICD-10-CM | POA: Diagnosis not present

## 2021-11-03 DIAGNOSIS — R278 Other lack of coordination: Secondary | ICD-10-CM | POA: Diagnosis not present

## 2021-11-03 LAB — CBC
Hematocrit: 39.4 % (ref 34.0–46.6)
Hemoglobin: 13.3 g/dL (ref 11.1–15.9)
MCH: 28.6 pg (ref 26.6–33.0)
MCHC: 33.8 g/dL (ref 31.5–35.7)
MCV: 85 fL (ref 79–97)
Platelets: 151 10*3/uL (ref 150–450)
RBC: 4.65 x10E6/uL (ref 3.77–5.28)
RDW: 13 % (ref 11.7–15.4)
WBC: 4.4 10*3/uL (ref 3.4–10.8)

## 2021-11-03 LAB — CMP14+EGFR
ALT: 7 IU/L (ref 0–32)
AST: 14 IU/L (ref 0–40)
Albumin/Globulin Ratio: 1.8 (ref 1.2–2.2)
Albumin: 4.6 g/dL (ref 3.8–4.8)
Alkaline Phosphatase: 73 IU/L (ref 44–121)
BUN/Creatinine Ratio: 14 (ref 12–28)
BUN: 15 mg/dL (ref 8–27)
Bilirubin Total: 0.7 mg/dL (ref 0.0–1.2)
CO2: 26 mmol/L (ref 20–29)
Calcium: 9.3 mg/dL (ref 8.7–10.3)
Chloride: 103 mmol/L (ref 96–106)
Creatinine, Ser: 1.05 mg/dL — ABNORMAL HIGH (ref 0.57–1.00)
Globulin, Total: 2.5 g/dL (ref 1.5–4.5)
Glucose: 88 mg/dL (ref 70–99)
Potassium: 3.9 mmol/L (ref 3.5–5.2)
Sodium: 141 mmol/L (ref 134–144)
Total Protein: 7.1 g/dL (ref 6.0–8.5)
eGFR: 58 mL/min/{1.73_m2} — ABNORMAL LOW (ref >59)

## 2021-11-03 LAB — HEMOGLOBIN A1C
Est. average glucose Bld gHb Est-mCnc: 117 mg/dL
Hgb A1c MFr Bld: 5.7 % — ABNORMAL HIGH (ref 4.8–5.6)

## 2021-11-03 LAB — LIPID PANEL
Chol/HDL Ratio: 2.5 ratio (ref 0.0–4.4)
Cholesterol, Total: 155 mg/dL (ref 100–199)
HDL: 61 mg/dL (ref >39)
LDL Chol Calc (NIH): 78 mg/dL (ref 0–99)
Triglycerides: 85 mg/dL (ref 0–149)
VLDL Cholesterol Cal: 16 mg/dL (ref 5–40)

## 2021-11-03 LAB — MICROALBUMIN / CREATININE URINE RATIO
Creatinine, Urine: 63 mg/dL
Microalb/Creat Ratio: 10 mg/g creat (ref 0–29)
Microalbumin, Urine: 6.3 ug/mL

## 2021-11-03 NOTE — Therapy (Signed)
?OUTPATIENT SPEECH LANGUAGE PATHOLOGY TREATMENT NOTE ? ? ?Patient Name: Denise [name] ?MRN: [id number] ?DOB:1951-07-03, 71 y.o., female ?Today's Date: 11/03/2021 ? ?PCP: Minette Brine, FNP ?REFERRING PROVIDER: Melvenia Beam, MD  ? ?END OF SESSION:  ? End of Session - 11/03/21 1533   ? ? Visit Number 5   ? Number of Visits 17   ? Date for SLP Re-Evaluation 12/10/21   ? Authorization Type BCBS Medicare   ? SLP Start Time 1531   ? SLP Stop Time  1615   ? SLP Time Calculation (min) 44 min   ? Activity Tolerance Patient tolerated treatment well   ? ?  ?  ? ?  ? ? ?Past Medical History:  ?Diagnosis Date  ? Chronic kidney disease   ? as a child, no present issues   ? Glaucoma   ? Osteopenia 06/2018  ? T score -1.9 FRAX 3% / 0.2%  ? Tuberculosis   ? 6th grade  ? ?Past Surgical History:  ?Procedure Laterality Date  ? TUBAL LIGATION    ? ?Patient Active Problem List  ? Diagnosis Date Noted  ? Parkinson's disease ([place]) 02/17/2020  ? Gait abnormality 12/04/2019  ? Prediabetes 12/03/2018  ? Vitamin D deficiency 12/03/2018  ? Anemia due to vitamin B12 deficiency 12/03/2018  ? Fatigue 12/03/2018  ? Urinary frequency 10/01/2018  ? Vitamin B12 deficiency 10/01/2018  ? 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  ? 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 Abnormal auditory perception of both ears 05/10/2016  ? Osteopenia 08/23/2012  ? ? ?ONSET DATE: Diagnosed ~2 years ago  ? ?REFERRING DIAG: G20 (ICD-10-CM) - Parkinson's disease  ? ?THERAPY DIAG: Dysarthria and anarthria ? ?SUBJECTIVE: "It's been going pretty good" ? ?PAIN:  ?Are you having pain? No ? ?OBJECTIVE:  ? ?TODAY'S TREATMENT:  ?11-03-21: Pt entered with reduced sub-optimal conversational volume averaging mid 60s dB, which improved to targeted conversational volume with occasional min A. Conducted ongoing education and instruction of Speak Out! Program for Parkinson's Disease. Targeted improving vocal quality and increasing intensity through progressively difficulty speech tasks.  Conducted Lesson #8 today. ST leads pt through exercises providing rare model to aid pt execution. Rare min-A required to achieve targeted dB this date. Averaged this date: loud "ah" 90 dB; reading 78 dB; cognitive speech task 77 dB. Subsequent conversational sample of approx 15 minutes, pt averaged low 70s dB with occasional min-A to maintain targeted vocal intensity. ? ?10-28-21: Pt entered with mildly reduced sub-optimal conversational volume averaging upper 60s dB. Conducted ongoing education and instruction of Speak Out! Program for Parkinson's Disease. Targeted improving vocal quality and increasing intensity through progressively difficulty speech tasks. Conducted Lesson #*5 today. ST leads pt through exercises providing occasional model to aid pt execution. Rare min-A required to achieve targeted dB this date. Averaged this date: loud "ah" 89 dB; reading 77 dB; cognitive speech task 76 dB. Subsequent conversational sample of approx 5 minutes, pt averaged low 70s dB with occasional min-A to maintain targeted vocal intensity as conversation progressed. ? ?10-26-21: Pt entered with sub-optimal conversational volume averaging mid 60s dB. Conducted ongoing education and instruction of Speak Out! Program for Parkinson's Disease. Targeted improving vocal quality and increasing intensity through progressively difficulty speech tasks. Conducted Lesson #4 today. ST leads pt through exercises providing occasional model to aid pt execution. Rare min-A required to achieve targeted dB this date. Averaged this date: loud "ah" 90 dB; reading 77 dB; cognitive speech task 75 dB. Subsequent conversational sample of approx 5 minutes, pt  averaged low 70s dB with rare min-A to maintain targeted vocal intensity as conversation progressed. ? ?10-19-21: Initiated education and instruction of Speak Out! Program for Parkinson's Disease. Targeted improving vocal quality and increasing intensity through progressively difficulty speech  tasks. Conducted Lesson #1 today. ST leads pt through exercises providing usual model prior to pt execution. Occasional fading to rare min-A required to achieve targeted dB this date. Averaged this date: loud "ah" 90 dB; reading 78 dB; cognitive speech task 76 dB. Subsequent conversational sample of approx 5 minutes, pt averaged low 70s dB with occasional min-A to maintain targeted vocal intensity as conversation progressed. ? ?10-12-21: Educated patient on clinical observations and recommendations for initiation of ST services to maximize vocal intensity and clarity due to reduced volume in conversation. Provided education and handout re: Speak Out! Program, in which pt will plan to watch webinar and order workbook before next ST session.  ?  ?  ?PATIENT EDUCATION: ?Education details: see above ?Person educated: Patient ?Education method: Explanation, Demonstration, and Handouts ?Education comprehension: verbalized understanding, returned demonstration, and needs further education ?  ?  ?HOME EXERCISE PROGRAM: ?Speak Out! Program  ?  ?  ?  ?  ?GOALS: ?Goals reviewed with patient? Yes ?  ?SHORT TERM GOALS: Target date: 11/12/2021 ?  ?Pt will complete HEP given occasional min A over 3 sessions ?Baseline: 10-26-21, 10-28-21, 11-03-21 ?Goal status: MET ?  ?2.  Pt will utilize dysarthria compensations on structured speech tasks with 80% accuracy given occasional min A over 3 sessions ?Baseline: 10-26-21, 10-28-21, 11-03-21 ?Goal status: MET ?  ?3.  Pt will maintain WNL conversational volume (70-72 dB) in 5-10 minute conversation given occasional min A over 2 sessions ?Baseline: 10-26-21, 11-03-21 ?Goal status: MET ?  ?4.  Pt will utilize memory/attention compensations as needed to optimize daily functioning and reduce caregiver burden given occasional min A over 2 sessions ?Baseline:  ?Goal status: ongoing ?  ?  ?LONG TERM GOALS: Target date: 12/10/2021 ?  ?Pt will complete HEP at least 1x/day (recommend BID) > 1 week ?Baseline:   ?Goal status: ongoing ?  ?2.  Pt will maintain WNL conversational volume (70-72 dB) in 10+ minute conversation given occasional min A over 2 sessions ?Baseline:  ?Goal status: ongoing ?  ?3.  Pt will carryover memory/attention compensations as needed to optimize daily functioning and reduce caregiver burden given rare min A > 1 week ?Baseline:  ?Goal status: ongoing ?  ?4.  Pt will report improved communication effectiveness via PROM with 2 point increase by last ST session  ?Baseline: CPIB=21 ?Goal status: ongoing ?  ?ASSESSMENT: ?  ?CLINICAL IMPRESSION: ?Patient is a 71 y.o. 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 Conducted education and training of Speak Out! Program to optimize vocal intensity and clarity. Pt able to achieve targeted dB with rare min A on structured tasks and occasional min A in conversation. Continue education and training of program to optimize patient understanding and carryover in conversation.  ?  ?OBJECTIVE IMPAIRMENTS  ?Objective impairments include memory and dysarthria. These impairments are limiting patient from household responsibilities and effectively communicating at home and in community.Factors affecting potential to achieve goals and functional outcome are medical prognosis. Patient will benefit from skilled SLP services to address above impairments and improve overall function. ?  ?REHAB POTENTIAL: Good ?  ?PLAN: ?SLP FREQUENCY: 2x/week ?  ?SLP DURATION: 8 weeks ?  ?PLANNED INTERVENTIONS: Language facilitation, Cueing hierachy, Cognitive reorganization,  Internal/external aids, Functional tasks, Multimodal communication approach, SLP instruction and feedback, Compensatory strategies, and Patient/family education ?  ? ? ?Marzetta Board, CCC-SLP ?11/03/2021, 3:34 PM ? ? ?

## 2021-11-03 NOTE — Therapy (Addendum)
?OUTPATIENT PHYSICAL THERAPY TREATMENT NOTE ? ? ?Patient Name: Denise [name] ?MRN: [id number] ?DOB:1950-08-13, 71 y.o., female ?Today's Date: 11/03/2021 ? ?PCP: Minette Brine, FNP ?REFERRING PROVIDER: Dr. Jaynee Eagles ? ?  ? PT End of Session - 11/03/21 1456   ? ? Visit Number 5   ? Number of Visits 13   ? Date for PT Re-Evaluation 01/11/22   due to delay in scheduling  ? Authorization Type BCBS Medicare   ? PT Start Time 1455   Previous pt session ran late  ? PT Stop Time 1530   ? PT Time Calculation (min) 35 min   ? Equipment Utilized During Treatment Gait belt   ? Activity Tolerance Patient tolerated treatment well   ? Behavior During Therapy [facility] for tasks assessed/performed   ? ?  ?  ? ?  ? ? ? ? ?Past Medical History:  ?Diagnosis Date  ? Chronic kidney disease   ? as a child, no present issues   ? Glaucoma   ? Osteopenia 06/2018  ? T score -1.9 FRAX 3% / 0.2%  ? Tuberculosis   ? 6th grade  ? ?Past Surgical History:  ?Procedure Laterality Date  ? TUBAL LIGATION    ? ?Patient Active Problem List  ? Diagnosis Date Noted  ? Parkinson's disease ([place]) 02/17/2020  ? Gait abnormality 12/04/2019  ? Prediabetes 12/03/2018  ? Vitamin D deficiency 12/03/2018  ? Anemia due to vitamin B12 deficiency 12/03/2018  ? Fatigue 12/03/2018  ? Urinary frequency 10/01/2018  ? Vitamin B12 deficiency 10/01/2018  ? 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  ? 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 Abnormal auditory perception of both ears 05/10/2016  ? Osteopenia 08/23/2012  ? ? ?REFERRING DIAG: G20 (ICD-10-CM) - Parkinson's disease ([place])  ? ?THERAPY DIAG:  ?Unsteadiness on feet ? ?Other abnormalities of gait and mobility ? ?Muscle weakness (generalized) ? ?PERTINENT HISTORY: PD, glaucoma, osteoporosis (newly diagnosed), TB (as a child)  ? ?T-score for left hip was - 2.7 done in January 2023 ? ?PRECAUTIONS: Fall, no driving  ? ?SUBJECTIVE: No new changes to report, has not been doing her exercises as frequently recently due to high number of  doctors appointments.  ? ?PAIN:  ?Are you having pain? No ? ? ?TODAY'S TREATMENT:  ?Gait Training/NMR  ?The following treadmill training was completed for aerobic/neural priming, endurance, step clearance and gait speed.  ?-Intervals of walking at 0.5-0.8 mph w/BUE support, total time of 13 minutes of walking. Pt very nervous throughout due to previous trauma on treadmill and required max verbal cues for upright posture and increased step length initially. Noted over-reliance on BUEs throughout. Pt requested frequent rest breaks to "shake out arms" and "reset". Provided max visual and verbal cues during rest breaks for tips to maintain close distance to screen of treadmill to minimize forward lean and initiate heel strike with each step to reduce shuffling gait pattern. Pt verbalized understanding. Provided theraball on front of treadmill for visual cue to "step towards ball" for improved step length.  ? ?  ?PATIENT EDUCATION: ?Education details: Rationale and benefits of treadmill training, continue HEP ?Person educated: Patient ?Education method: Explanation/Demo ?Education comprehension: verbalized and demonstrated understanding ?  ?  ?HOME EXERCISE PROGRAM: ?Standing and seated PWR, Pt has in her exercise folder.  ? ? Access Code: [email or web address] ?URL: [email or web address] ?Date: 10/19/2021 ?Prepared by: [name] ? ?Reviewed/condensed exercises for balance:  ? ?Exercises ?- Alternating Step Backward with Support  - 1 x daily - 5 x weekly - 1-2 sets -  10 reps ?- Romberg Stance Eyes Closed on Foam Pad  - 1 x daily - 5 x weekly - 3 sets - 30 hold ? ?Plus standing tall against the wall for posture - 2 x 30 second holds  ?  ?  ?  ?GOALS: ?Goals reviewed with patient? Yes ?  ?SHORT TERM GOALS: Target date: 11/10/2021 ?  ?Pt will improve 5x sit<>stand to less than or equal to 13.5 sec w/o UE support to demonstrate improved functional strength and transfer efficiency.  ?  ?Baseline: 15.94 seconds ?Goal  status: INITIAL ?  ?2.  Pt will improve manual TUG time to 13.5 seconds or less in order to demo decrease fall risk. ?  ?Baseline: 15.4 sec ?Goal status: INITIAL ?  ?3.  Pt will undergo further assessment of 6MWT with LTG written.      ?Baseline: 1217' with RPE 8/10       ?Goal status: MET ?  ?  ?  ?LONG TERM GOALS: Target date: 12/08/2021 ?  ?           Pt will be independent with final PD HEP for improved strength, balance, transfers, and gait ?Baseline:  ?Goal status: INITIAL ?  ?2.  Pt will improve MiniBESTest score to at least 22/28 for decreased fall risk.  ?  ?Baseline: 18/28 ?Goal status: INITIAL ?  ?3.  Pt will improve TUG cognitive score to less than or equal to 15 seconds for decreased fall risk and improved dual tasking.  ?  ?Baseline: 18.75 sec ?Goal status: INITIAL ?  ?4.  Pt will improve 6MWT to at least 1250' with improved gait mechanics (arm swing, posture, and incr heel strike) and RPE to 6/10 or less in order to demo improved gait efficiency/endurance. ?Baseline: 1217' with RPE 8/10 ?Goal status: INITIAL ?  ?5.  Pt will improve gait speed with no AD to at least 3.2 ft/sec in order to demo improved community mobility.  ?  ?Baseline: 2.77 ft/sec ?Goal status: INITIAL ?  ?  ?ASSESSMENT: ?  ?CLINICAL IMPRESSION: ?Emphasis of skilled PT session on gait training on treadmill for improved postural control, step length, step clearance and cadence. Pt very hesitant to try treadmill, as she reportedly fell on a treadmill years ago with family. However, pt tolerated treadmill training well and began to exhibit increased step length w/heel strike towards end of session. Will continue to progress treadmill training as pt will benefit from gait training for improved step length/clearance, cadence and amplitude. Continue POC.  ?  ?  ?OBJECTIVE IMPAIRMENTS Abnormal gait, decreased activity tolerance, decreased balance, decreased coordination, decreased endurance, decreased strength, impaired flexibility, and  postural dysfunction.  ?  ?ACTIVITY LIMITATIONS community activity and driving.  ?  ?PERSONAL FACTORS Past/current experiences, Time since onset of injury/illness/exacerbation, and 1 comorbidity: osteoporosis  are also affecting patient's functional outcome.  ?  ?  ?REHAB POTENTIAL: Good ?  ?CLINICAL DECISION MAKING: Stable/uncomplicated ?  ?EVALUATION COMPLEXITY: Low ?  ?PLAN: ?PT FREQUENCY: 2x/week ?  ?PT DURATION: 12 weeks ?  ?PLANNED INTERVENTIONS: Therapeutic exercises, Therapeutic activity, Neuromuscular re-education, Balance training, Gait training, Patient/Family education, Stair training, and DME instructions ?  ?PLAN FOR NEXT SESSION: STG assessment (pt had no appointments scheduled after 5/3 and therapist did not catch it until end of session, so goals will be late), GAIT TRAINING WITH POSTURE, HEEL STRIKE/ARM SWING. Treadmill training, obstacle navigation, retro gait. Work on larger amplitude movements. Balance on unlevel surfaces. SLS. UE/LE coordination, dual-tasks (cog/motor) ?  ? ?[place]  E Madisun Hargrove, PT, DPT  ?11/03/2021, 3:31 PM ? ?  ? ?

## 2021-11-07 ENCOUNTER — Other Ambulatory Visit: Payer: Self-pay | Admitting: Nurse Practitioner

## 2021-11-07 DIAGNOSIS — M810 Age-related osteoporosis without current pathological fracture: Secondary | ICD-10-CM

## 2021-11-09 ENCOUNTER — Ambulatory Visit: Payer: Medicare Other | Admitting: Occupational Therapy

## 2021-11-17 ENCOUNTER — Encounter: Payer: Self-pay | Admitting: Nurse Practitioner

## 2021-11-24 ENCOUNTER — Ambulatory Visit: Payer: Medicare Other | Admitting: Occupational Therapy

## 2021-11-24 ENCOUNTER — Ambulatory Visit: Payer: Medicare Other

## 2021-11-24 ENCOUNTER — Ambulatory Visit: Payer: Medicare Other | Admitting: Physical Therapy

## 2021-11-24 DIAGNOSIS — R29818 Other symptoms and signs involving the nervous system: Secondary | ICD-10-CM

## 2021-11-24 DIAGNOSIS — R2689 Other abnormalities of gait and mobility: Secondary | ICD-10-CM

## 2021-11-24 DIAGNOSIS — R2681 Unsteadiness on feet: Secondary | ICD-10-CM

## 2021-11-24 DIAGNOSIS — M6281 Muscle weakness (generalized): Secondary | ICD-10-CM

## 2021-11-24 DIAGNOSIS — R278 Other lack of coordination: Secondary | ICD-10-CM | POA: Diagnosis not present

## 2021-11-24 DIAGNOSIS — R293 Abnormal posture: Secondary | ICD-10-CM | POA: Diagnosis not present

## 2021-11-24 DIAGNOSIS — R41841 Cognitive communication deficit: Secondary | ICD-10-CM | POA: Diagnosis not present

## 2021-11-24 DIAGNOSIS — R471 Dysarthria and anarthria: Secondary | ICD-10-CM | POA: Diagnosis not present

## 2021-11-24 DIAGNOSIS — M25612 Stiffness of left shoulder, not elsewhere classified: Secondary | ICD-10-CM

## 2021-11-24 DIAGNOSIS — R29898 Other symptoms and signs involving the musculoskeletal system: Secondary | ICD-10-CM | POA: Diagnosis not present

## 2021-11-24 NOTE — Therapy (Signed)
OUTPATIENT PHYSICAL THERAPY TREATMENT NOTE   Patient Name: Denise [name] MRN: [id number] DOB:04-09-1951, 71 y.o., female Today's Date: 11/24/2021  PCP: Minette Brine, FNP REFERRING PROVIDER: Dr. Jaynee Eagles     PT End of Session - 11/24/21 1534     Visit Number 6    Number of Visits 13    Date for PT Re-Evaluation 01/11/22   due to delay in scheduling   Authorization Type BCBS Medicare    PT Start Time 1532    PT Stop Time 1613    PT Time Calculation (min) 41 min    Equipment Utilized During Treatment Gait belt    Activity Tolerance Patient tolerated treatment well    Behavior During Therapy [facility] 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   Past Surgical History:  Procedure Laterality Date   TUBAL LIGATION     Patient Active Problem List   Diagnosis Date Noted   Parkinson's disease ([place]) 02/17/2020   Gait abnormality 12/04/2019   Prediabetes 12/03/2018   Vitamin D deficiency 12/03/2018   Anemia due to vitamin B12 deficiency 12/03/2018   Fatigue 12/03/2018   Urinary frequency 10/01/2018   Vitamin B12 deficiency 10/01/2018   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 States she has been doing her exercises regularly and  they are going well. No new changes   PAIN:  Are you having pain? No   TODAY'S TREATMENT:  Ther Act   [facility] PT Assessment - 11/24/21 1546       Transfers   Five time sit to stand comments  15.08s without UE support      Timed Up and Go Test   Manual TUG (seconds) 12.85             Gait Training  The following treadmill training was completed for aerobic/neural priming, increased step length, endurance, and gait speed. BUE support throughout - Warmup: 3:00 up to 1.0 mph. Min cues for upright posture and increased step length  - HIIT: 5:00 30sec ON/OFF alternating blue theraball kicks and normal walking at 1.0 mph. Mod verbal cues for high amplitude and velocity kicks. Noted increased step length and heel strike following ball kicks.  -Cool down: 90 sec at 1.5 mph as pt more comfortable on treadmill and able to increase length of steps.   Self-care/home management  Lengthy discussion regarding goal outcomes and implementing walking program for improved functional mobility and endurance, setting goal for 150 min of brisk walking/week.     PATIENT EDUCATION: Education details: Goal assessment, continue HEP Person educated: Patient Education method: Explanation/Demo Education comprehension: verbalized and demonstrated understanding  HOME EXERCISE PROGRAM: Standing and seated PWR, Pt has in her exercise folder.    Access Code: [email or web address] URL: [email or web address] Date: 10/19/2021 Prepared by: [name]  Reviewed/condensed exercises for balance:   Exercises - Alternating Step Backward with Support  - 1 x daily - 5 x weekly - 1-2 sets - 10 reps - Romberg Stance Eyes Closed on Foam Pad  - 1 x daily - 5 x weekly - 3 sets - 30 hold  Plus standing tall against the wall for posture - 2 x 30 second holds        GOALS: Goals reviewed with patient? Yes   SHORT TERM GOALS: Target date: 11/10/2021   Pt will improve 5x sit<>stand to less than or equal  to 13.5 sec w/o UE support to demonstrate improved functional strength and transfer efficiency.    Baseline: 15.94 seconds; 15.03s without UE support  Goal status: NOT MET    2.  Pt will improve manual TUG time to 13.5 seconds or less in order to demo decrease fall risk.   Baseline: 15.4 sec; 12.85s on 5/24 Goal status: MET    3.  Pt will undergo further assessment of 6MWT with LTG written.      Baseline: 1217' with RPE 8/10       Goal status: MET       LONG TERM GOALS: Target date: 12/08/2021   Pt will be independent with final PD HEP for improved strength, balance, transfers, and gait Baseline:  Goal status: INITIAL   2.  Pt will improve MiniBESTest score to at least 22/28 for decreased fall risk.    Baseline: 18/28 Goal status: INITIAL   3.  Pt will improve TUG cognitive score to less than or equal to 15 seconds for decreased fall risk and improved dual tasking.    Baseline: 18.75 sec Goal status: INITIAL   4.  Pt will improve 6MWT to at least 1250' with improved gait mechanics (arm swing, posture, and incr heel strike) and RPE to 6/10 or less in order to demo improved gait efficiency/endurance. Baseline: 1217' with RPE 8/10 Goal status: INITIAL   5.  Pt will improve gait speed with no AD to at least 3.2 ft/sec in order to demo improved community mobility.    Baseline: 2.77 ft/sec Goal status: INITIAL     ASSESSMENT:   CLINICAL IMPRESSION: Emphasis of skilled PT session on gait training on treadmill and STG assessment. Pt met 2 of 3 STGs, maintaining her speed on 5x STS but improving her manual TUG by 3s. Pt significantly improved her step length and posture on treadmill today, increasing her top speed from 0.8 to 1.5 mph. Continue POC.      OBJECTIVE IMPAIRMENTS Abnormal gait, decreased activity tolerance, decreased balance, decreased coordination, decreased endurance, decreased strength, impaired flexibility, and postural dysfunction.    ACTIVITY LIMITATIONS  community activity and driving.    PERSONAL FACTORS Past/current experiences, Time since onset of injury/illness/exacerbation, and 1 comorbidity: osteoporosis  are also affecting patient's functional outcome.      REHAB POTENTIAL: Good   CLINICAL DECISION MAKING: Stable/uncomplicated   EVALUATION COMPLEXITY: Low   PLAN: PT FREQUENCY: 2x/week   PT DURATION: 12 weeks   PLANNED INTERVENTIONS: Therapeutic exercises, Therapeutic activity, Neuromuscular re-education, Balance training, Gait training, Patient/Family education, Stair training, and DME instructions   PLAN FOR NEXT SESSION:  GAIT TRAINING WITH POSTURE, HEEL STRIKE/ARM SWING. Treadmill training, obstacle navigation, retro gait. Work on larger amplitude movements. Balance on unlevel surfaces. SLS. UE/LE  coordination, dual-tasks (cog/motor), pt requesting to work on leg strength (squats, eccentric heel taps).     Cruzita Lederer Emmilynn Marut, PT, DPT  11/24/2021, 4:34 PM

## 2021-11-24 NOTE — Patient Instructions (Signed)

## 2021-11-24 NOTE — Therapy (Signed)
OUTPATIENT OCCUPATIONAL THERAPY TREATMENT NOTE   Patient Name: Denise [name] MRN: [id number] DOB:09-19-1950, 71 y.o., female Today's Date: 11/24/2021  PCP: Minette Brine, FNP REFERRING PROVIDER:  Dr. Jaynee Eagles   END OF SESSION:   OT End of Session - 11/24/21 1418     Visit Number 5    Number of Visits 9    Date for OT Re-Evaluation 12/14/21    Authorization Type BCBS Medicare:  no auth, no visit limit $40 copay-    Authorization Time Period 8 weeks, however pt is only scheduled for 6 weeks    Authorization - Visit Number 5    Authorization - Number of Visits 10    Progress Note Due on Visit 10    OT Start Time 1403    OT Stop Time 1445    OT Time Calculation (min) 42 min    Activity Tolerance Patient tolerated treatment well    Behavior During Therapy WFL 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   Past Surgical History:  Procedure Laterality Date   TUBAL LIGATION     Patient Active Problem List   Diagnosis Date Noted   Parkinson's disease ([place]) 02/17/2020   Gait abnormality 12/04/2019   Prediabetes 12/03/2018   Vitamin D deficiency 12/03/2018   Anemia due to vitamin B12 deficiency 12/03/2018   Fatigue 12/03/2018   Urinary frequency 10/01/2018   Vitamin B12 deficiency 10/01/2018   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   Abnormal auditory perception of both ears 05/10/2016   Osteopenia 08/23/2012    ONSET DATE: 09/27/21    REFERRING DIAG: 09/27/21   THERAPY DIAG: Parkinson's disease Unsteadiness on feet  Other abnormalities of gait and mobility  Muscle weakness (generalized)  Other symptoms and signs involving the musculoskeletal system  Other lack of coordination  Other symptoms and signs involving the nervous  system  Stiffness of left shoulder, not elsewhere classified  Abnormal posture   PERTINENT HISTORY: returns to occupational therapy for Parkinson's disease for follow up eval as recommended when last d/c. PMH: ostopenia, glaucoma  PRECAUTIONS: fall  SUBJECTIVE:denies pain  PAIN:  Are you having pain? No     TODAY'S TREATMENT:  Sitting closed-chain shoulder flex (floor>ceiling) and diagonals with BUEs with min v.c. for large amplitude movements. Modified quadraped PWR! Up and twist, 10 reps each min v.c, and demonstration for amplitude. PWR! Hands followed by practice fastening / unfastening buttons with big movements min v.c and demonstration Therapist started checking short term goals, see goals. Discussed and modified pt's schedule as needed due to high co-pay for OT     PATIENT EDUCATION: 11/24/21-Education details: ways to prevent future PD complications Person educated:  patient Education method:  explanation, demo Education comprehension:  verbalized understanding, returned demo, cueing needed  ASSESSMENT:   CLINICAL IMPRESSION: Pt responds well to cueing for large amplitude movements.  Pt demo improved postural alignment following warm up and v.c for posture.   PERFORMANCE DEFICITS in functional skills including ADLs, IADLs, coordination, dexterity, ROM, strength, flexibility, FMC, GMC, mobility, balance, endurance, decreased knowledge of precautions, decreased knowledge of use of DME, and UE functional use, cognitive skills including attention, memory, problem  solving, safety awareness, and thought, and psychosocial skills including coping strategies, environmental adaptation, and routines and behaviors.    IMPAIRMENTS are limiting patient from ADLs, IADLs, play, leisure, and social participation.    COMORBIDITIES may have co-morbidities  that affects occupational performance. Patient will benefit from skilled OT to address above impairments and improve overall  function.   MODIFICATION OR ASSISTANCE TO COMPLETE EVALUATION: No modification of tasks or assist necessary to complete an evaluation.   OT OCCUPATIONAL PROFILE AND HISTORY: Problem focused assessment: Including review of records relating to presenting problem.   CLINICAL DECISION MAKING: LOW - limited treatment options, no task modification necessary   REHAB POTENTIAL: Good   EVALUATION COMPLEXITY: Low        GOALS:     SHORT TERM GOALS: Target date: 11/09/2021   I with PD specific HEP  Goal status:  ongoing    2.  Pt will verbalize understanding of adapted strategies to maximize safety and I with ADLs/ IADLs .  Goal status: ongoing   3.  Pt will demonstrate improved fine motor coordination for ADLs as evidenced by decreasing 9 hole peg test score for LUE by 3 secs   Baseline: R 23.31, L 35.72 Goal status: ongoing-55 secs, 31.44, not consistent   4.  Pt will verbalize understanding of ways to prevent future PD related complications    Goal status: ongoing     LONG TERM GOALS: Target date: 12/14/21 anticipate d/c after 6 weeks dependent on progress   Pt will perform functional activity for at least 6 min while maintaining upright posture/avoiding leaning to L.  Baseline: pt leans to left side Goal status: INITIAL   2.  Pt will demonstrate improved ease with fastening buttons as evidenced by decreasing 3 button/ unbutton time to :39 secs or less     Baseline: 42.94 Goal status: INITIAL   3.  Pt will demonstrate ability to retrieve a lightweight object at 140 shoulder flexion and -5 elbow extension with LUE   Baseline: LUE 130, -10 Goal status: INITIAL   4. Pt will demonstrate improved LUE functional use for ADLs as evidenced by increasing box/ blocks score by 3 blocks with LUE. Baseline: RUE 52, LUE 48 Goal status: inital   PLAN: OT FREQUENCY: 1x/week   OT DURATION: 8 weeks plus eval    PLANNED INTERVENTIONS: self care/ADL training, therapeutic exercise,  therapeutic activity, neuromuscular re-education, manual therapy, passive range of motion, gait training, balance training, functional mobility training, splinting, ultrasound, paraffin, fluidotherapy, moist heat, patient/family education, cognitive remediation/compensation, energy conservation, coping strategies training, and DME and/or AE instructions   RECOMMENDED OTHER SERVICES: n/a   CONSULTED AND AGREED WITH PLAN OF CARE: Patient   PLAN FOR NEXT SESSION:   functional reaching, consider bag exercises,  review strategies for ADLs,    Hiran Leard, OT 11/24/2021, 2:21 PM Theone Murdoch, OTR/L [phone] Phone: [phone] 3:57 PM 11/24/21

## 2021-11-24 NOTE — Therapy (Signed)
OUTPATIENT SPEECH LANGUAGE PATHOLOGY TREATMENT NOTE   Patient Name: Denise [name] MRN: [id number] DOB:1950/07/20, 71 y.o., female Today's Date: 11/24/2021  PCP: Minette Brine, FNP REFERRING PROVIDER: Melvenia Beam, MD   END OF SESSION:   End of Session - 11/24/21 1357     Visit Number 6    Number of Visits 17    Date for SLP Re-Evaluation 12/10/21    Authorization Type BCBS Medicare    SLP Start Time 1448    SLP Stop Time  1530    SLP Time Calculation (min) 42 min    Activity Tolerance Patient tolerated treatment well             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   Past Surgical History:  Procedure Laterality Date   TUBAL LIGATION     Patient Active Problem List   Diagnosis Date Noted   Parkinson's disease ([place]) 02/17/2020   Gait abnormality 12/04/2019   Prediabetes 12/03/2018   Vitamin D deficiency 12/03/2018   Anemia due to vitamin B12 deficiency 12/03/2018   Fatigue 12/03/2018   Urinary frequency 10/01/2018   Vitamin B12 deficiency 10/01/2018   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 No  OBJECTIVE:   TODAY'S TREATMENT:  11-24-21: Pt returned after several week gap in therapy. Pt has completed HEP twice a day almost every day since last ST session. Pt averaged upper 60s dB in opening conversation, which improved with cuing and modeling. Targeted improving vocal quality and increasing intensity through progressively difficulty  speech tasks. Conducted Lesson #19 today. ST leads pt through exercises providing rare model to aid pt execution and accuracy. Rare min-A required to achieve targeted dB this date. Averaged this date: loud "ah" 90 dB; reading 76 dB; cognitive speech task 72 dB. Subsequent conversational sample of approx 5-8 minutes, pt averaged low 70s dB with occasional min-A to maintain targeted vocal intensity.  11-03-21: Pt entered with reduced sub-optimal conversational volume averaging mid 60s dB, which improved to targeted conversational volume with occasional min A. Conducted ongoing education and instruction of [facility] Program for Parkinson's Disease. Targeted improving vocal quality and increasing intensity through progressively difficulty speech tasks. Conducted Lesson #8 today. ST leads pt through exercises providing rare model to aid pt execution. Rare min-A required to achieve targeted dB this date. Averaged this date: loud "ah" 90 dB; reading 78 dB; cognitive speech task 77 dB. Subsequent conversational sample of approx 15 minutes, pt averaged low 70s dB with occasional min-A to maintain targeted vocal intensity.  10-28-21: Pt entered with mildly reduced sub-optimal conversational volume averaging upper 60s dB. Conducted ongoing education and instruction of [facility] for Parkinson's Disease. Targeted improving vocal quality and increasing intensity through progressively difficulty speech tasks. Conducted Lesson #*5 today. ST leads pt through exercises providing occasional model to aid pt execution. Rare min-A required to achieve targeted dB this date. Averaged this date: loud "ah" 89 dB;  reading 77 dB; cognitive speech task 76 dB. Subsequent conversational sample of approx 5 minutes, pt averaged low 70s dB with occasional min-A to maintain targeted vocal intensity as conversation progressed.  10-26-21: Pt entered with sub-optimal conversational volume averaging mid 60s dB. Conducted ongoing education and  instruction of [facility] for Parkinson's Disease. Targeted improving vocal quality and increasing intensity through progressively difficulty speech tasks. Conducted Lesson #4 today. ST leads pt through exercises providing occasional model to aid pt execution. Rare min-A required to achieve targeted dB this date. Averaged this date: loud "ah" 90 dB; reading 77 dB; cognitive speech task 75 dB. Subsequent conversational sample of approx 5 minutes, pt averaged low 70s dB with rare min-A to maintain targeted vocal intensity as conversation progressed.   PATIENT EDUCATION: Education details: see above Person educated: Patient Education method: Explanation, Demonstration, and Handouts Education comprehension: verbalized understanding, returned demonstration, and needs further education     HOME EXERCISE PROGRAM: [facility]      GOALS: Goals reviewed with patient? Yes   SHORT TERM GOALS: Target date: 11/12/2021   Pt will complete HEP given occasional min A over 3 sessions Baseline: 10-26-21, 10-28-21, 11-03-21 Goal status: MET   2.  Pt will utilize dysarthria compensations on structured speech tasks with 80% accuracy given occasional min A over 3 sessions Baseline: 10-26-21, 10-28-21, 11-03-21 Goal status: MET   3.  Pt will maintain WNL conversational volume (70-72 dB) in 5-10 minute conversation given occasional min A over 2 sessions Baseline: 10-26-21, 11-03-21 Goal status: MET   4.  Pt will utilize memory/attention compensations as needed to optimize daily functioning and reduce caregiver burden given occasional min A over 2 sessions Baseline:  Goal status: DEFERRED     LONG TERM GOALS: Target date: 12/10/2021   Pt will complete HEP at least 1x/day (recommend BID) > 1 week Baseline:  Goal status: ongoing   2.  Pt will maintain WNL conversational volume (70-72 dB) in 10+ minute conversation given occasional min A over 2 sessions Baseline:  Goal status: ongoing   3.  Pt will  carryover memory/attention compensations as needed to optimize daily functioning and reduce caregiver burden given rare min A > 1 week Baseline:  Goal status: ongoing   4.  Pt will report improved communication effectiveness via PROM with 2 point increase by last ST session  Baseline: CPIB=21 Goal status: ongoing   ASSESSMENT:   CLINICAL IMPRESSION: Patient is a 71 y.o. 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 Conducted education and training of [facility] Program to optimize vocal intensity and clarity. Pt able to achieve targeted dB with rare min A on structured tasks and occasional min A in conversation. Continue education and training of program to optimize patient understanding and carryover in conversation.    OBJECTIVE IMPAIRMENTS  Objective impairments include memory and dysarthria. These impairments are limiting patient from household responsibilities and effectively communicating at home and in community.Factors affecting potential to achieve goals and functional outcome are medical prognosis. Patient will benefit from skilled SLP services to address above impairments and improve overall function.   REHAB POTENTIAL: Good   PLAN: SLP FREQUENCY: 2x/week   SLP DURATION: 8 weeks   PLANNED INTERVENTIONS: Language facilitation, Cueing hierachy, Cognitive reorganization, Internal/external aids, Functional tasks, Multimodal communication approach, SLP instruction and feedback, Compensatory strategies, and Patient/family education     Marzetta Board, CCC-SLP 11/24/2021, 2:48 PM

## 2021-11-30 ENCOUNTER — Ambulatory Visit: Payer: Medicare Other

## 2021-11-30 ENCOUNTER — Ambulatory Visit: Payer: Medicare Other | Admitting: Occupational Therapy

## 2021-11-30 DIAGNOSIS — M25612 Stiffness of left shoulder, not elsewhere classified: Secondary | ICD-10-CM

## 2021-11-30 DIAGNOSIS — R471 Dysarthria and anarthria: Secondary | ICD-10-CM

## 2021-11-30 DIAGNOSIS — R2681 Unsteadiness on feet: Secondary | ICD-10-CM

## 2021-11-30 DIAGNOSIS — R278 Other lack of coordination: Secondary | ICD-10-CM | POA: Diagnosis not present

## 2021-11-30 DIAGNOSIS — R2689 Other abnormalities of gait and mobility: Secondary | ICD-10-CM

## 2021-11-30 DIAGNOSIS — R41841 Cognitive communication deficit: Secondary | ICD-10-CM | POA: Diagnosis not present

## 2021-11-30 DIAGNOSIS — R29898 Other symptoms and signs involving the musculoskeletal system: Secondary | ICD-10-CM | POA: Diagnosis not present

## 2021-11-30 DIAGNOSIS — R29818 Other symptoms and signs involving the nervous system: Secondary | ICD-10-CM | POA: Diagnosis not present

## 2021-11-30 DIAGNOSIS — R293 Abnormal posture: Secondary | ICD-10-CM

## 2021-11-30 DIAGNOSIS — M6281 Muscle weakness (generalized): Secondary | ICD-10-CM

## 2021-11-30 NOTE — Patient Instructions (Addendum)
Ways to prevent future Parkinson's related complications: 1.   Exercise regularly,  2.   Focus on BIGGER movements during daily activities- really reach overhead, straighten elbows and extend fingers 3.   When dressing or reaching for your seatbelt make sure to use your body to assist by twisting while you reach-this can help to minimize stress on the shoulder and reduce the risk of a rotator cuff tear 4.   Swing your arms when you walk! People with PD are at increased risk for frozen shoulder and swinging your arms can  reduce this risk.                                       PWR! Hands  With arms stretched out in front of you (elbows straight), perform the following: PWR! Rock: Move wrists up and down [employer]! Twist: Twist palms up and down BIG  Then, start with elbows bent and hands closed. PWR! Step: Touch index finger to thumb while keeping other fingers straight. Flick fingers out BIG (thumb out/straighten fingers). Repeat with other fingers. (Step your thumb to each finger). PWR! Hands: Push hands out BIG. Elbows straight, wrists up, fingers open and spread apart BIG. (Can also perform by pushing down on table, chair, knees. Push above head, out to the side, behind you, in front of you.)   ** Make each movement big and deliberate so that you feel the movement.  Perform at least 10 repetitions 1x/day, but perform PWR! hands throughout the day when you are having trouble using your hands (picking up/manipulating small objects, writing, eating, typing, sewing, buttoning, etc.).      Coordination Exercises  Perform the following exercises for 20 minutes 1 times per day. Perform with both hand(s). Perform using big movements.  Flipping Cards: Place deck of cards on the table. Flip cards over by opening your hand big to grasp and then turn your palm up big. Deal cards: Hold 1/2 or whole deck in your hand. Use thumb to push card off top of deck with  one big push. Rotate ball with fingertips: Pick up with fingers/thumb and move as much as you can with each turn/movement (clockwise and counter-clockwise). Pick up coins and stack one at a time: Pick up with big, intentional movements. Do not drag coin to the edge. (5-10 in a stack) Pick up 5-10 coins one at a time and hold in palm. Then, move coins from palm to fingertips one at time and place in coin bank/container. Perform "Flicks"/hand stretches (PWR! Hands): Close hands then flick out your fingers with focus on opening hands, pulling wrists back, and extending elbows like you are pushing.

## 2021-11-30 NOTE — Therapy (Signed)
OUTPATIENT OCCUPATIONAL THERAPY TREATMENT NOTE   Patient Name: Denise [name] MRN: [id number] DOB:20-Jun-1951, 71 y.o., female Today's Date: 11/30/2021  PCP: Minette Brine, FNP REFERRING PROVIDER:  Dr. Jaynee Eagles   END OF SESSION:   OT End of Session - 11/30/21 1542     Visit Number 6    Number of Visits 9    Date for OT Re-Evaluation 12/14/21    Authorization Type BCBS Medicare:  no auth, no visit limit $40 copay-    Authorization Time Period 8 weeks, however pt is only scheduled for 6 weeks    Authorization - Visit Number 6    Authorization - Number of Visits 10    Progress Note Due on Visit 10    OT Start Time 1447    OT Stop Time 1530    OT Time Calculation (min) 43 min    Activity Tolerance Patient tolerated treatment well    Behavior During Therapy WFL for tasks assessed/performed                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   Past Surgical History:  Procedure Laterality Date   TUBAL LIGATION     Patient Active Problem List   Diagnosis Date Noted   Parkinson's disease ([place]) 02/17/2020   Gait abnormality 12/04/2019   Prediabetes 12/03/2018   Vitamin D deficiency 12/03/2018   Anemia due to vitamin B12 deficiency 12/03/2018   Fatigue 12/03/2018   Urinary frequency 10/01/2018   Vitamin B12 deficiency 10/01/2018   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   Abnormal auditory perception of both ears 05/10/2016   Osteopenia 08/23/2012    ONSET DATE: 09/27/21    REFERRING DIAG: 09/27/21   THERAPY DIAG: Parkinson's disease Other abnormalities of gait and mobility  Muscle weakness (generalized)  Other symptoms and signs involving the musculoskeletal system  Other lack of coordination  Other symptoms and signs involving the nervous system  Stiffness of left  shoulder, not elsewhere classified  Abnormal posture  Unsteadiness on feet   PERTINENT HISTORY: returns to occupational therapy for Parkinson's disease for follow up eval as recommended when last d/c. PMH: ostopenia, glaucoma  PRECAUTIONS: fall  SUBJECTIVE:denies pain  PAIN:  Are you having pain? No     TODAY'S TREATMENT:  Sitting closed-chain shoulder flex and chest press with cane, min v.c  for large amplitude movements and amplitude. PWR! Basic 4 in seated , 10 reps each min v.c, and demonstration for amplitude/ posture. PWR! Hands followed  coordination HEP, see pt instructions, min v.c for amplitude  Dynamic step and reach with right and left UE's, min v.c    PATIENT EDUCATION: 12/01/22-Education details: ways to prevent future PD complications, PWR! Moves basic 4 seated, coordination HEP, PWR! Hands basic 4 Person educated:  patient Education method:  explanation, demo, handout Education comprehension:  verbalized understanding, returned demo, cueing needed    GOALS:     SHORT TERM GOALS: Target date: 11/09/2021   I with PD specific HEP  Goal status:  ongoing    2.  Pt will verbalize understanding of adapted strategies to maximize safety and I with ADLs/ IADLs .  Goal status: ongoing   3.  Pt will demonstrate improved fine motor coordination for ADLs as  evidenced by decreasing 9 hole peg test score for LUE by 3 secs   Baseline: R 23.31, L 35.72 Goal status: ongoing-55 secs, 31.44, not consistent   4.  Pt will verbalize understanding of ways to prevent future PD related complications    Goal status:  achieved     LONG TERM GOALS: Target date: 12/14/21 anticipate d/c after 6 weeks dependent on progress   Pt will perform functional activity for at least 6 min while maintaining upright posture/avoiding leaning to L.  Baseline: pt leans to left side Goal status: INITIAL   2.  Pt will demonstrate improved ease with fastening buttons as evidenced by decreasing  3 button/ unbutton time to :39 secs or less     Baseline: 42.94 Goal status: INITIAL   3.  Pt will demonstrate ability to retrieve a lightweight object at 140 shoulder flexion and -5 elbow extension with LUE   Baseline: LUE 130, -10 Goal status: INITIAL   4. Pt will demonstrate improved LUE functional use for ADLs as evidenced by increasing box/ blocks score by 3 blocks with LUE. Baseline: RUE 52, LUE 48 Goal status: inital     ASSESSMENT:   CLINICAL IMPRESSION: Pt responds well to cueing for large amplitude movements. Pt demonstrates understanding of coordination HEP, and PWR! hands.   PERFORMANCE DEFICITS in functional skills including ADLs, IADLs, coordination, dexterity, ROM, strength, flexibility, FMC, GMC, mobility, balance, endurance, decreased knowledge of precautions, decreased knowledge of use of DME, and UE functional use, cognitive skills including attention, memory, problem solving, safety awareness, and thought, and psychosocial skills including coping strategies, environmental adaptation, and routines and behaviors.    IMPAIRMENTS are limiting patient from ADLs, IADLs, play, leisure, and social participation.    COMORBIDITIES may have co-morbidities  that affects occupational performance. Patient will benefit from skilled OT to address above impairments and improve overall function.   MODIFICATION OR ASSISTANCE TO COMPLETE EVALUATION: No modification of tasks or assist necessary to complete an evaluation.   OT OCCUPATIONAL PROFILE AND HISTORY: Problem focused assessment: Including review of records relating to presenting problem.   CLINICAL DECISION MAKING: LOW - limited treatment options, no task modification necessary   REHAB POTENTIAL: Good   EVALUATION COMPLEXITY: Low        PLAN: OT FREQUENCY: 1x/week   OT DURATION: 8 weeks plus eval    PLANNED INTERVENTIONS: self care/ADL training, therapeutic exercise, therapeutic activity, neuromuscular  re-education, manual therapy, passive range of motion, gait training, balance training, functional mobility training, splinting, ultrasound, paraffin, fluidotherapy, moist heat, patient/family education, cognitive remediation/compensation, energy conservation, coping strategies training, and DME and/or AE instructions   RECOMMENDED OTHER SERVICES: n/a   CONSULTED AND AGREED WITH PLAN OF CARE: Patient   PLAN FOR NEXT SESSION:   work towards goals, 2 visits remain, functional reaching,  review strategies for ADLs,    Dontae Minerva, OT 11/30/2021, 3:43 PM Theone Murdoch, OTR/L [phone] Phone: [phone] 3:43 PM 11/30/21

## 2021-11-30 NOTE — Therapy (Signed)
OUTPATIENT SPEECH LANGUAGE PATHOLOGY TREATMENT NOTE   Patient Name: Denise [name] MRN: [id number] DOB:20-Jan-1951, 71 y.o., female Today's Date: 11/30/2021  PCP: Minette Brine, FNP REFERRING PROVIDER: Melvenia Beam, MD   END OF SESSION:   End of Session - 11/30/21 1402     Visit Number 7    Number of Visits 17    Date for SLP Re-Evaluation 12/10/21    Authorization Type BCBS Medicare    SLP Start Time 6712   restroom break   SLP Stop Time  4580    SLP Time Calculation (min) 40 min    Activity Tolerance Patient tolerated treatment well              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   Past Surgical History:  Procedure Laterality Date   TUBAL LIGATION     Patient Active Problem List   Diagnosis Date Noted   Parkinson's disease ([place]) 02/17/2020   Gait abnormality 12/04/2019   Prediabetes 12/03/2018   Vitamin D deficiency 12/03/2018   Anemia due to vitamin B12 deficiency 12/03/2018   Fatigue 12/03/2018   Urinary frequency 10/01/2018   Vitamin B12 deficiency 10/01/2018   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 No  OBJECTIVE:   TODAY'S TREATMENT:  11-30-21: Continues to complete HEP consistently and routinely, including online Speak Out! Exercises. Occasional cues required to optimize conversational volume in opening conversation. Conducted Lesson #22 today. ST leads pt through exercises  providing rare model to aid pt execution and accuracy. Rare min-A required to achieve targeted dB this date. Averaged this date: loud "ah" 89 dB; reading 76 dB; cognitive speech task 73 dB. Subsequent conversational sample of approx 5 minutes, pt averaged low 70s dB with rare min-A to maintain targeted vocal intensity.  11-24-21: Pt returned after several week gap in therapy. Pt has completed HEP twice a day almost every day since last ST session. Pt averaged upper 60s dB in opening conversation, which improved with cuing and modeling. Targeted improving vocal quality and increasing intensity through progressively difficulty speech tasks. Conducted Lesson #19 today. ST leads pt through exercises providing rare model to aid pt execution and accuracy. Rare min-A required to achieve targeted dB this date. Averaged this date: loud "ah" 90 dB; reading 76 dB; cognitive speech task 72 dB with rare min A to optimize speaking with intensity due to complexity of cognitive task. Subsequent conversational sample of approx 5 minutes, pt averaged low 70s dB with occasional min-A to maintain targeted vocal intensity.  11-03-21: Pt entered with reduced sub-optimal conversational volume averaging mid 60s dB, which improved to targeted conversational volume with occasional min A. Conducted ongoing education and instruction of Speak Out! Program for Parkinson's Disease. Targeted improving vocal quality and increasing intensity through progressively difficulty speech tasks. Conducted Lesson #8 today. ST leads pt through exercises providing rare model to aid pt execution. Rare  min-A required to achieve targeted dB this date. Averaged this date: loud "ah" 90 dB; reading 78 dB; cognitive speech task 77 dB. Subsequent conversational sample of approx 15 minutes, pt averaged low 70s dB with occasional min-A to maintain targeted vocal intensity.  10-28-21: Pt entered with mildly reduced sub-optimal conversational volume averaging upper 60s  dB. Conducted ongoing education and instruction of Speak Out! Program for Parkinson's Disease. Targeted improving vocal quality and increasing intensity through progressively difficulty speech tasks. Conducted Lesson #*5 today. ST leads pt through exercises providing occasional model to aid pt execution. Rare min-A required to achieve targeted dB this date. Averaged this date: loud "ah" 89 dB; reading 76 dB; cognitive speech task 76 dB. Subsequent conversational sample of approx 5 minutes, pt averaged low 70s dB with occasional min-A to maintain targeted vocal intensity as conversation progressed.   PATIENT EDUCATION: Education details: see above Person educated: Patient Education method: Research scientist (physical sciences) Education comprehension: verbalized understanding, returned demonstration, and needs further education     HOME EXERCISE PROGRAM: Speak Out! Program      GOALS: Goals reviewed with patient? Yes   SHORT TERM GOALS: Target date: 11/12/2021   Pt will complete HEP given occasional min A over 3 sessions Baseline: 10-26-21, 10-28-21, 11-03-21 Goal status: MET   2.  Pt will utilize dysarthria compensations on structured speech tasks with 80% accuracy given occasional min A over 3 sessions Baseline: 10-26-21, 10-28-21, 11-03-21 Goal status: MET   3.  Pt will maintain WNL conversational volume (70-72 dB) in 5-10 minute conversation given occasional min A over 2 sessions Baseline: 10-26-21, 11-03-21 Goal status: MET   4.  Pt will utilize memory/attention compensations as needed to optimize daily functioning and reduce caregiver burden given occasional min A over 2 sessions Baseline:  Goal status: DEFERRED     LONG TERM GOALS: Target date: 12/10/2021   Pt will complete HEP at least 1x/day (recommend BID) > 1 week Baseline:  Goal status: ongoing   2.  Pt will maintain WNL conversational volume (70-72 dB) in 10+ minute conversation given occasional min A over 2 sessions Baseline:  Goal  status: ongoing   3.  Pt will carryover memory/attention compensations as needed to optimize daily functioning and reduce caregiver burden given rare min A > 1 week Baseline:  Goal status: ongoing   4.  Pt will report improved communication effectiveness via PROM with 2 point increase by last ST session  Baseline: CPIB=21 Goal status: ongoing   ASSESSMENT:   CLINICAL IMPRESSION: Patient is a 71 y.o. 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 Conducted education and training of Speak Out! Program to optimize vocal intensity and clarity. Pt able to achieve targeted dB with rare min A on structured tasks and occasional min A in conversation. Continue education and training of program to optimize patient understanding and carryover in conversation.    OBJECTIVE IMPAIRMENTS  Objective impairments include memory and dysarthria. These impairments are limiting patient from household responsibilities and effectively communicating at home and in community.Factors affecting potential to achieve goals and functional outcome are medical prognosis. Patient will benefit from skilled SLP services to address above impairments and improve overall function.   REHAB POTENTIAL: Good   PLAN: SLP FREQUENCY: 2x/week   SLP DURATION: 8 weeks   PLANNED INTERVENTIONS: Language facilitation, Cueing hierachy, Cognitive reorganization, Internal/external aids, Functional tasks, Multimodal communication approach, SLP instruction and feedback, Compensatory strategies, and Patient/family education  Marzetta Board, [place] 11/30/2021, 2:27 PM

## 2021-12-01 ENCOUNTER — Encounter: Payer: Self-pay | Admitting: Physical Therapy

## 2021-12-01 ENCOUNTER — Encounter: Payer: Medicare Other | Admitting: Occupational Therapy

## 2021-12-01 ENCOUNTER — Ambulatory Visit: Payer: Medicare Other

## 2021-12-01 ENCOUNTER — Ambulatory Visit: Payer: Medicare Other | Admitting: Physical Therapy

## 2021-12-01 DIAGNOSIS — R2689 Other abnormalities of gait and mobility: Secondary | ICD-10-CM | POA: Diagnosis not present

## 2021-12-01 DIAGNOSIS — R29898 Other symptoms and signs involving the musculoskeletal system: Secondary | ICD-10-CM | POA: Diagnosis not present

## 2021-12-01 DIAGNOSIS — M6281 Muscle weakness (generalized): Secondary | ICD-10-CM

## 2021-12-01 DIAGNOSIS — R471 Dysarthria and anarthria: Secondary | ICD-10-CM | POA: Diagnosis not present

## 2021-12-01 DIAGNOSIS — R293 Abnormal posture: Secondary | ICD-10-CM | POA: Diagnosis not present

## 2021-12-01 DIAGNOSIS — R2681 Unsteadiness on feet: Secondary | ICD-10-CM | POA: Diagnosis not present

## 2021-12-01 DIAGNOSIS — M25612 Stiffness of left shoulder, not elsewhere classified: Secondary | ICD-10-CM | POA: Diagnosis not present

## 2021-12-01 DIAGNOSIS — R278 Other lack of coordination: Secondary | ICD-10-CM | POA: Diagnosis not present

## 2021-12-01 DIAGNOSIS — R29818 Other symptoms and signs involving the nervous system: Secondary | ICD-10-CM

## 2021-12-01 DIAGNOSIS — R41841 Cognitive communication deficit: Secondary | ICD-10-CM | POA: Diagnosis not present

## 2021-12-01 NOTE — Therapy (Signed)
OUTPATIENT PHYSICAL THERAPY TREATMENT NOTE   Patient Name: Denise [name] MRN: [id number] DOB:13-Jan-1951, 71 y.o., female Today's Date: 12/01/2021  PCP: Minette Brine, FNP REFERRING PROVIDER: Dr. Jaynee Eagles     PT End of Session - 12/01/21 1410     Visit Number 7    Number of Visits 13    Date for PT Re-Evaluation 01/11/22   due to delay in scheduling   Authorization Type BCBS Medicare    PT Start Time 1410   pt late to session and needing to use restroom at start   PT Stop Time 1447    PT Time Calculation (min) 37 min    Equipment Utilized During Treatment Gait belt    Activity Tolerance Patient tolerated treatment well    Behavior During Therapy [facility] for tasks assessed/performed                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   Past Surgical History:  Procedure Laterality Date   TUBAL LIGATION     Patient Active Problem List   Diagnosis Date Noted   Parkinson's disease ([place]) 02/17/2020   Gait abnormality 12/04/2019   Prediabetes 12/03/2018   Vitamin D deficiency 12/03/2018   Anemia due to vitamin B12 deficiency 12/03/2018   Fatigue 12/03/2018   Urinary frequency 10/01/2018   Vitamin B12 deficiency 10/01/2018   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 Is  walking, but not getting in as many steps as she should.   PAIN:  Are you having pain? No   TODAY'S TREATMENT:     Pt performs PWR! Moves in standing  position on air ex:    PWR! Up for improved posture -x20 reps, cues to hold posture/scap retraction for a few seconds.    PWR! Rock for improved weighshifting x10 reps each side, then an additional x10 reps each side with trying to float contralateral leg off air ex for dynamic SLS, pt needing intermittent UE support on chair    PWR! Twist for improved trunk rotation x10 reps    PWR! Step for improved step initiation - initial cues for incr foot clearance. 10 reps each side     10 reps sit <> stands on air ex without UE support, initial cues for incr forward lean. Cues for eccentric control  4" step with air ex on top; forward step ups with contralateral march x12 reps each leg, pt needing intermittent UE support for balance, trying to hold march position for a couple seconds for dynamic SLS.  On 4" step x10 reps lateral step ups each leg with contralateral march, cues for posture and looking ahead when standing tall.  Forward step and tossing scarf x12 reps each side, cues for incr amplitude of movement and  opening up hands big when tossing scarf.  Wide BOS lateral reach to grab scarf and then stepping back to midline and tossing scarf x3 reps each side, pt does not open her hands enough when tossing and scarf does not leave her hand. Cues to open up hands big when tossing, pt did not do this until the last rep.   Cues for gait during session for posture, arm swing, and opening up hands as pt tends to hold her hands in a curled up position.         PATIENT EDUCATION: Education details: Continue with HEP/walking.  Person educated: Patient Education method: Explanation/Demo Education comprehension: verbalized and demonstrated understanding     HOME EXERCISE PROGRAM: Standing and seated PWR, Pt has in her exercise folder.     Access Code: [email or web address] URL: [email or web address] Date: 10/19/2021 Prepared by: [name]  Reviewed/condensed exercises for balance:   Exercises - Alternating Step Backward with Support  - 1 x daily - 5 x weekly - 1-2 sets - 10 reps - Romberg Stance Eyes Closed on Foam Pad  - 1 x daily - 5 x weekly - 3 sets - 30 hold  Plus standing tall against the wall for posture - 2 x 30 second holds        GOALS: Goals reviewed with patient? Yes   SHORT TERM GOALS: Target date: 11/10/2021   Pt will improve 5x sit<>stand to less than or equal to 13.5 sec w/o UE support to demonstrate improved functional strength and transfer efficiency.    Baseline: 15.94 seconds; 15.03s without UE support  Goal status: NOT MET    2.  Pt will improve manual TUG time to 13.5 seconds or less in order to demo decrease fall risk.   Baseline: 15.4 sec; 12.85s on 5/24 Goal status: MET    3.  Pt will undergo further assessment of 6MWT with LTG written.      Baseline: 1217' with RPE 8/10       Goal status: MET       LONG TERM GOALS: Target date: 12/08/2021   Pt will be independent with final PD HEP for improved strength, balance, transfers, and gait Baseline:  Goal status: INITIAL   2.  Pt will improve MiniBESTest score to at least 22/28 for decreased fall risk.    Baseline: 18/28 Goal status: INITIAL   3.  Pt will improve TUG cognitive score to less than or equal to 15 seconds for decreased fall risk and improved dual tasking.    Baseline: 18.75 sec Goal status: INITIAL   4.  Pt will improve 6MWT to at least 1250' with improved gait mechanics (arm swing, posture, and incr heel strike) and RPE to 6/10 or less in order to demo improved gait efficiency/endurance. Baseline: 1217' with RPE 8/10 Goal status: INITIAL   5.  Pt will improve gait speed with no AD to at least 3.2 ft/sec in order to demo improved community mobility.    Baseline: 2.77 ft/sec Goal status: INITIAL      ASSESSMENT:   CLINICAL IMPRESSION: Today's skilled session worked on balance strategies on compliant surfaces, SLS, and functional strengthening. Pt needing cues during session to open up hands big during exercises and gait as pt tends to hold with her hands in a curled position. Will continue to progress towards LTGs.       OBJECTIVE IMPAIRMENTS Abnormal gait, decreased activity tolerance, decreased balance, decreased coordination, decreased endurance, decreased strength, impaired flexibility, and postural dysfunction.  ACTIVITY LIMITATIONS community activity and driving.    PERSONAL FACTORS Past/current experiences, Time since onset of injury/illness/exacerbation, and 1 comorbidity: osteoporosis  are also affecting patient's functional outcome.      REHAB POTENTIAL: Good   CLINICAL DECISION MAKING: Stable/uncomplicated   EVALUATION COMPLEXITY: Low   PLAN: PT FREQUENCY: 2x/week   PT DURATION: 12 weeks   PLANNED INTERVENTIONS: Therapeutic exercises, Therapeutic activity, Neuromuscular re-education, Balance training, Gait training, Patient/Family education, Stair training, and DME instructions   PLAN FOR NEXT SESSION:  GAIT TRAINING WITH POSTURE, HEEL STRIKE/ARM SWING. Treadmill training, obstacle navigation, retro gait. Work on larger amplitude movements. Balance on unlevel surfaces. SLS. UE/LE coordination, dual-tasks (cog/motor), pt requesting to work on leg strength (squats, eccentric heel taps).     Arliss Journey, PT, DPT  12/01/2021, 5:08 PM

## 2021-12-01 NOTE — Therapy (Signed)
OUTPATIENT SPEECH LANGUAGE PATHOLOGY TREATMENT NOTE   Patient Name: Denise [name] MRN: [id number] DOB:Dec 03, 1950, 71 y.o., female Today's Date: 12/01/2021  PCP: Minette Brine, FNP REFERRING PROVIDER: Melvenia Beam, MD   END OF SESSION:   End of Session - 12/01/21 1405     Visit Number 8    Number of Visits 17    Date for SLP Re-Evaluation 12/10/21    Authorization Type BCBS Medicare    SLP Start Time 1447    SLP Stop Time  1530    SLP Time Calculation (min) 43 min    Activity Tolerance Patient tolerated treatment well              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   Past Surgical History:  Procedure Laterality Date   TUBAL LIGATION     Patient Active Problem List   Diagnosis Date Noted   Parkinson's disease ([place]) 02/17/2020   Gait abnormality 12/04/2019   Prediabetes 12/03/2018   Vitamin D deficiency 12/03/2018   Anemia due to vitamin B12 deficiency 12/03/2018   Fatigue 12/03/2018   Urinary frequency 10/01/2018   Vitamin B12 deficiency 10/01/2018   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 No  OBJECTIVE:   TODAY'S TREATMENT:  12-01-21: Upper 60s dB exhibited in opening conversation, in which pt identified volume was reduced. Today's session focused on achieving and maintaining conversational speech volume (70-72 dB). Brief warm up conducted via Speak Out! Workbook, Lesson #23 with modifications. Pt completed all  targeted exercises with rare min A to optimize intensity. For subsequent 15+ minute conversation, pt averaged 71 dB with rare min A to optimize vocal intensity. Pt able to successfully compete with added background noise and achieve targeted conversational volume with mod I.   11-30-21: Continues to complete HEP consistently and routinely, including online Speak Out! Exercises. Occasional cues required to optimize conversational volume in opening conversation. Conducted Lesson #22 today. ST leads pt through exercises providing rare model to aid pt execution and accuracy. Rare min-A required to achieve targeted dB this date. Averaged this date: loud "ah" 89 dB; reading 76 dB; cognitive speech task 73 dB. Subsequent conversational sample of approx 5 minutes, pt averaged low 70s dB with rare min-A to maintain targeted vocal intensity.  11-24-21: Pt returned after several week gap in therapy. Pt has completed HEP twice a day almost every day since last ST session. Pt averaged upper 60s dB in opening conversation, which improved with cuing and modeling. Targeted improving vocal quality and increasing intensity through progressively difficulty speech tasks. Conducted Lesson #19 today. ST leads pt through exercises providing rare model to aid pt execution and accuracy. Rare min-A required to achieve targeted dB this date. Averaged this date: loud "ah" 90 dB; reading 76 dB; cognitive speech task 72 dB with rare min A to optimize speaking with intensity due to complexity of cognitive task. Subsequent conversational sample of  approx 5 minutes, pt averaged low 70s dB with occasional min-A to maintain targeted vocal intensity.  11-03-21: Pt entered with reduced sub-optimal conversational volume averaging mid 60s dB, which improved to targeted conversational volume with occasional min A. Conducted ongoing education and instruction of Speak Out! Program for Parkinson's Disease. Targeted improving vocal quality and increasing  intensity through progressively difficulty speech tasks. Conducted Lesson #8 today. ST leads pt through exercises providing rare model to aid pt execution. Rare min-A required to achieve targeted dB this date. Averaged this date: loud "ah" 90 dB; reading 78 dB; cognitive speech task 77 dB. Subsequent conversational sample of approx 15 minutes, pt averaged low 70s dB with occasional min-A to maintain targeted vocal intensity.  PATIENT EDUCATION: Education details: see above Person educated: Patient Education method: Research scientist (physical sciences) Education comprehension: verbalized understanding, returned demonstration, and needs further education     HOME EXERCISE PROGRAM: Speak Out! Program      GOALS: Goals reviewed with patient? Yes   SHORT TERM GOALS: Target date: 11/12/2021   Pt will complete HEP given occasional min A over 3 sessions Baseline: 10-26-21, 10-28-21, 11-03-21 Goal status: MET   2.  Pt will utilize dysarthria compensations on structured speech tasks with 80% accuracy given occasional min A over 3 sessions Baseline: 10-26-21, 10-28-21, 11-03-21 Goal status: MET   3.  Pt will maintain WNL conversational volume (70-72 dB) in 5-10 minute conversation given occasional min A over 2 sessions Baseline: 10-26-21, 11-03-21 Goal status: MET   4.  Pt will utilize memory/attention compensations as needed to optimize daily functioning and reduce caregiver burden given occasional min A over 2 sessions Baseline:  Goal status: DEFERRED     LONG TERM GOALS: Target date: 12/10/2021   Pt will complete HEP at least 1x/day (recommend BID) > 1 week Baseline:  Goal status: ongoing   2.  Pt will maintain WNL conversational volume (70-72 dB) in 10+ minute conversation given occasional min A over 2 sessions Baseline: 12-01-21 Goal status: ongoing   3.  Pt will carryover memory/attention compensations as needed to optimize daily functioning and reduce caregiver burden given rare min A > 1  week Baseline:  Goal status: ongoing   4.  Pt will report improved communication effectiveness via PROM with 2 point increase by last ST session  Baseline: CPIB=21 Goal status: ongoing   ASSESSMENT:   CLINICAL IMPRESSION: Patient is a 71 y.o. 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 Conducted education and training of Speak Out! Program to optimize vocal intensity and clarity. Pt able to achieve targeted dB with rare min A on structured tasks and rare to occasional min A in conversation. Continue education and training of program to optimize patient understanding and carryover in conversation.    OBJECTIVE IMPAIRMENTS  Objective impairments include memory and dysarthria. These impairments are limiting patient from household responsibilities and effectively communicating at home and in community.Factors affecting potential to achieve goals and functional outcome are medical prognosis. Patient will benefit from skilled SLP services to address above impairments and improve overall function.   REHAB POTENTIAL: Good   PLAN: SLP FREQUENCY: 2x/week   SLP DURATION: 8 weeks   PLANNED INTERVENTIONS: Language facilitation, Cueing hierachy, Cognitive reorganization, Internal/external aids, Functional tasks, Multimodal communication approach, SLP instruction and feedback, Compensatory strategies, and Patient/family education     Marzetta Board, CCC-SLP 12/01/2021, 2:47 PM

## 2021-12-02 ENCOUNTER — Ambulatory Visit: Payer: Medicare Other | Attending: Nurse Practitioner | Admitting: Physical Therapy

## 2021-12-02 DIAGNOSIS — R2681 Unsteadiness on feet: Secondary | ICD-10-CM | POA: Diagnosis not present

## 2021-12-02 DIAGNOSIS — R2689 Other abnormalities of gait and mobility: Secondary | ICD-10-CM | POA: Diagnosis not present

## 2021-12-02 DIAGNOSIS — R41841 Cognitive communication deficit: Secondary | ICD-10-CM | POA: Insufficient documentation

## 2021-12-02 DIAGNOSIS — R29898 Other symptoms and signs involving the musculoskeletal system: Secondary | ICD-10-CM | POA: Insufficient documentation

## 2021-12-02 DIAGNOSIS — R278 Other lack of coordination: Secondary | ICD-10-CM | POA: Diagnosis not present

## 2021-12-02 DIAGNOSIS — R29818 Other symptoms and signs involving the nervous system: Secondary | ICD-10-CM | POA: Insufficient documentation

## 2021-12-02 DIAGNOSIS — R293 Abnormal posture: Secondary | ICD-10-CM | POA: Insufficient documentation

## 2021-12-02 DIAGNOSIS — R471 Dysarthria and anarthria: Secondary | ICD-10-CM | POA: Insufficient documentation

## 2021-12-02 DIAGNOSIS — M25612 Stiffness of left shoulder, not elsewhere classified: Secondary | ICD-10-CM | POA: Insufficient documentation

## 2021-12-02 DIAGNOSIS — M6281 Muscle weakness (generalized): Secondary | ICD-10-CM | POA: Insufficient documentation

## 2021-12-02 NOTE — Therapy (Signed)
OUTPATIENT PHYSICAL THERAPY TREATMENT NOTE   Patient Name: Denise [name] MRN: [id number] DOB:1951/03/11, 71 y.o., female Today's Date: 12/02/2021  PCP: Minette Brine, FNP REFERRING PROVIDER: Dr. Jaynee Eagles     PT End of Session - 12/02/21 1406     Visit Number 8    Number of Visits 13    Date for PT Re-Evaluation 01/11/22   due to delay in scheduling   Authorization Type BCBS Medicare    PT Start Time 1405    PT Stop Time 1437   Ended session early due to pt feeling ill and not taking her BP medicine today   PT Time Calculation (min) 32 min    Equipment Utilized During Treatment Gait belt    Activity Tolerance Patient tolerated treatment well;Patient limited by lethargy   Pt reported she did not take her BP medication this AM, systolic BP elevated and pt appeared pallor   Behavior During Therapy [facility] for tasks assessed/performed                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   Past Surgical History:  Procedure Laterality Date   TUBAL LIGATION     Patient Active Problem List   Diagnosis Date Noted   Parkinson's disease ([place]) 02/17/2020   Gait abnormality 12/04/2019   Prediabetes 12/03/2018   Vitamin D deficiency 12/03/2018   Anemia due to vitamin B12 deficiency 12/03/2018   Fatigue 12/03/2018   Urinary frequency 10/01/2018   Vitamin B12 deficiency 10/01/2018   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 No other changes to report. Exercises are going well.   PAIN:  Are you having pain? No  VITALS: All BP Readings taken in LUE while seated  -After treadmill: BP 162/77 mmHg, HR 58 bpm    TODAY'S TREATMENT:    Gait Training  The following treadmill training was completed for aerobic/neural priming, increased step length, endurance, and gait speed. BUE support throughout - Warmup: 3:00 up to 1.3 mph. Min cues for upright posture and increased step length. Notable difficulty maintaining step length and cadence w/speed of treadmill, so reduced speed to 1.1 mph  - HIIT: 5:00 30sec ON/OFF alternating blue theraball kicks and normal walking at 1.1 mph. Mod verbal cues for high amplitude and velocity kicks, but pt unable to maintain. Pt appeared pallor in color and stated she felt "off"  -Cool down: 90 sec at 1.1 mph, noted significant anterior lean and shuffling gait   Self-care/home management Provided chair for pt to sit on treadmill and assessed vitals (see above). Pt reports she did not take her BP meds today because they cause her to urinate too much. Encouraged pt to take her meds regularly and therapy will always allow  her to use the restroom. Pt verbalized understanding  Provided CGA of pt while walking to her car in parking lot due to pt report of fatigue. Handoff w/pt's daughter in front of vehicle and informed daughter of situation.     PATIENT EDUCATION: Education details: See self-care section, discussed POC and pt wishes to DC from PT on 6/19 as daughter is having oncology surgery on 6/21 and she is concerned regarding the health of her daughter. Person educated: Patient Education method: Explanation/Demo Education comprehension: verbalized and demonstrated understanding     HOME EXERCISE PROGRAM: Standing  and seated PWR, Pt has in her exercise folder.    Access Code: [email or web address] URL: [email or web address] Date: 10/19/2021 Prepared by: [name]  Reviewed/condensed exercises for balance:   Exercises - Alternating Step Backward with Support  - 1 x daily - 5 x weekly - 1-2 sets - 10 reps - Romberg Stance Eyes Closed on Foam Pad  - 1 x daily - 5 x weekly - 3 sets - 30 hold  Plus standing tall against the wall for posture - 2 x 30 second holds        GOALS: Goals reviewed with patient? Yes   SHORT TERM GOALS: Target date: 11/10/2021   Pt will improve 5x sit<>stand to less than or equal to 13.5 sec w/o UE support to demonstrate improved functional strength and transfer efficiency.    Baseline: 15.94 seconds; 15.03s without UE support  Goal status: NOT MET    2.  Pt will improve manual TUG time to 13.5 seconds or less in order to demo decrease fall risk.   Baseline: 15.4 sec; 12.85s on 5/24 Goal status: MET    3.  Pt will undergo further assessment of 6MWT with LTG written.      Baseline: 1217' with RPE 8/10       Goal status: MET       LONG TERM GOALS: Target date: 12/08/2021   Pt will be independent with final PD HEP for improved strength, balance, transfers, and gait Baseline:  Goal status: INITIAL   2.  Pt will improve MiniBESTest score to at least 22/28 for decreased fall risk.    Baseline: 18/28 Goal status: INITIAL   3.  Pt will improve TUG cognitive score to less than or equal to 15 seconds for decreased fall risk and improved dual tasking.    Baseline: 18.75 sec Goal status: INITIAL   4.  Pt will improve 6MWT to at least 1250' with improved gait mechanics (arm swing, posture, and incr heel strike) and RPE to 6/10 or less in order to demo improved gait efficiency/endurance. Baseline: 1217' with RPE 8/10 Goal status: INITIAL   5.  Pt will improve gait speed with no AD to at least 3.2 ft/sec in order to demo improved community mobility.     Baseline: 2.77 ft/sec Goal status: INITIAL     ASSESSMENT:   CLINICAL IMPRESSION: Skilled PT session limited due to pt not taking BP meds and pt being emotional regarding medical status of daughter. Pt became very pale and reported fatigue during treadmill training, later informed therapist that she had not taken her BP medication due to it causing urinary frequency and it takes away from her PT sessions. Discussed allotting time between disciplines to allow pt to use restroom in between sessions so pt does not need to go during session. Ended session early due to pt request and assisted pt out of clinic and into car. Continue POC.  OBJECTIVE IMPAIRMENTS Abnormal gait, decreased activity tolerance, decreased balance, decreased coordination, decreased endurance, decreased strength, impaired flexibility, and postural dysfunction.    ACTIVITY LIMITATIONS community activity and driving.    PERSONAL FACTORS Past/current experiences, Time since onset of injury/illness/exacerbation, and 1 comorbidity: osteoporosis  are also affecting patient's functional outcome.      REHAB POTENTIAL: Good   CLINICAL DECISION MAKING: Stable/uncomplicated   EVALUATION COMPLEXITY: Low   PLAN: PT FREQUENCY: 2x/week   PT DURATION: 12 weeks   PLANNED INTERVENTIONS: Therapeutic exercises, Therapeutic activity, Neuromuscular re-education, Balance training, Gait training, Patient/Family education, Stair training, and DME instructions   PLAN FOR NEXT SESSION:  Check BP, GAIT TRAINING WITH POSTURE, HEEL STRIKE/ARM SWING. Treadmill training, obstacle navigation, retro gait. Work on larger amplitude movements. Balance on unlevel surfaces. SLS. UE/LE coordination, dual-tasks (cog/motor), pt requesting to work on leg strength (squats, eccentric heel taps). Plan to DC on 6/19     Starlin Steib E Elea Holtzclaw, PT, DPT  12/02/2021, 3:03 PM

## 2021-12-06 ENCOUNTER — Ambulatory Visit: Payer: Medicare Other

## 2021-12-06 ENCOUNTER — Ambulatory Visit: Payer: Medicare Other | Admitting: Physical Therapy

## 2021-12-06 ENCOUNTER — Encounter: Payer: Medicare Other | Admitting: Occupational Therapy

## 2021-12-06 VITALS — BP 148/82 | HR 56

## 2021-12-06 DIAGNOSIS — R293 Abnormal posture: Secondary | ICD-10-CM | POA: Diagnosis not present

## 2021-12-06 DIAGNOSIS — M6281 Muscle weakness (generalized): Secondary | ICD-10-CM | POA: Diagnosis not present

## 2021-12-06 DIAGNOSIS — R41841 Cognitive communication deficit: Secondary | ICD-10-CM | POA: Diagnosis not present

## 2021-12-06 DIAGNOSIS — R29818 Other symptoms and signs involving the nervous system: Secondary | ICD-10-CM | POA: Diagnosis not present

## 2021-12-06 DIAGNOSIS — R471 Dysarthria and anarthria: Secondary | ICD-10-CM | POA: Diagnosis not present

## 2021-12-06 DIAGNOSIS — R29898 Other symptoms and signs involving the musculoskeletal system: Secondary | ICD-10-CM | POA: Diagnosis not present

## 2021-12-06 DIAGNOSIS — R278 Other lack of coordination: Secondary | ICD-10-CM | POA: Diagnosis not present

## 2021-12-06 DIAGNOSIS — R2681 Unsteadiness on feet: Secondary | ICD-10-CM | POA: Diagnosis not present

## 2021-12-06 DIAGNOSIS — R2689 Other abnormalities of gait and mobility: Secondary | ICD-10-CM | POA: Diagnosis not present

## 2021-12-06 DIAGNOSIS — M25612 Stiffness of left shoulder, not elsewhere classified: Secondary | ICD-10-CM | POA: Diagnosis not present

## 2021-12-06 NOTE — Therapy (Signed)
OUTPATIENT PHYSICAL THERAPY TREATMENT NOTE   Patient Name: Denise [name] MRN: [id number] DOB:August 09, 1950, 71 y.o., female Today's Date: 12/06/2021  PCP: Minette Brine, FNP REFERRING PROVIDER: Dr. Jaynee Eagles     PT End of Session - 12/06/21 1326     Visit Number 9    Number of Visits 13    Date for PT Re-Evaluation 01/11/22   due to delay in scheduling   Authorization Type BCBS Medicare    PT Start Time 1325   Pt arrived late and used restroom prior to session   PT Stop Time 1355   Allowed pt time to use restroom between sessions   PT Time Calculation (min) 30 min    Equipment Utilized During Treatment Gait belt    Activity Tolerance Patient tolerated treatment well   Pt reported she did not take her BP medication this AM, systolic BP elevated and pt appeared pallor   Behavior During Therapy 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   Past Surgical History:  Procedure Laterality Date   TUBAL LIGATION     Patient Active Problem List   Diagnosis Date Noted   Parkinson's disease ([place]) 02/17/2020   Gait abnormality 12/04/2019   Prediabetes 12/03/2018   Vitamin D deficiency 12/03/2018   Anemia due to vitamin B12 deficiency 12/03/2018   Fatigue 12/03/2018   Urinary frequency 10/01/2018   Vitamin B12 deficiency 10/01/2018   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 No new changes, took her BP meds today.   PAIN:  Are you having pain? No   There were no vitals filed for this visit.   TODAY'S TREATMENT:  Gait Training  The following treadmill training was completed for aerobic/neural priming, increased step length, endurance, and gait speed. BUE support throughout - Warmup: 3:00 up to 1.3 mph. Min cues for upright posture and increased step length, which pt able to maintain without cues.  - HIIT: 5:00 30sec ON/OFF alternating blue theraball kicks and normal walking at 1.3 mph. Mod verbal cues for high amplitude and velocity kicks, noted decreased amplitude/velocity of LLE > RLE. -Cool down: 90 sec at 1.3 mph.   NMR  Beside counter for improved step clearance, single leg stability, and step length:  -6" hurdle navigation in fwd direction, x10 w/CGA-min A for steadying assist. Noted pt unable to clear hurdles w/LLE, mod cues to exaggerate step length to facilitate clearance and improve balance. Also noted pt placing LLE in midline, leading to instability w/task. Min cues to maintain feet hip width apart throughout for increased BOS. Pt able to finish x3 reps without tripping over hurdle w/cues to over-exaggerate movement.     PATIENT EDUCATION:  Education details: Continue HEP, thinking about over-exaggerating step length of LLE for symmetric step length w/gait  Person educated: Patient Education method: Explanation/Demo Education comprehension: verbalized and demonstrated understanding     HOME EXERCISE PROGRAM: Standing and seated PWR, Pt has in her exercise folder.    Access Code: [email or web address] URL: [email or web address] Date: 10/19/2021 Prepared by: [name]  Reviewed/condensed exercises for balance:   Exercises - Alternating Step Backward with Support  - 1 x daily - 5 x weekly - 1-2 sets - 10 reps - Romberg Stance  Eyes Closed on Foam Pad  - 1 x daily - 5 x weekly - 3 sets - 30 hold  Plus standing tall against the wall for posture - 2 x 30 second holds        GOALS: Goals reviewed with patient? Yes   SHORT TERM GOALS: Target date: 11/10/2021   Pt will improve 5x sit<>stand to less than or equal to 13.5 sec w/o UE support to demonstrate improved functional strength and transfer efficiency.    Baseline: 15.94 seconds; 15.03s without UE support  Goal status: NOT MET    2.  Pt will improve manual TUG time to 13.5 seconds or less in order to demo decrease fall risk.   Baseline: 15.4 sec; 12.85s on 5/24 Goal status: MET    3.  Pt will undergo further assessment of 6MWT with LTG written.      Baseline: 1217' with RPE 8/10       Goal status: MET       LONG TERM GOALS: Target date: 12/20/2021 (Extended due to delay in scheduling)    Pt will be independent with final PD HEP for improved strength, balance, transfers, and gait Baseline:  Goal status: INITIAL   2.  Pt will improve MiniBESTest score to at least 22/28 for decreased fall risk.    Baseline: 18/28 Goal status: INITIAL   3.  Pt will improve TUG cognitive score to less than or equal to 15 seconds for decreased fall risk and improved dual tasking.    Baseline: 18.75 sec Goal status: INITIAL   4.  Pt will improve 6MWT to at least 1250' with improved gait mechanics (arm swing, posture, and incr heel strike) and RPE to 6/10 or less in order to demo improved gait efficiency/endurance. Baseline: 1217' with RPE 8/10 Goal status: INITIAL   5.  Pt will improve gait speed with no AD to at least 3.2 ft/sec in order to demo improved community mobility.    Baseline: 2.77 ft/sec Goal status: INITIAL     ASSESSMENT:   CLINICAL IMPRESSION: Emphasis of skilled PT session on gait training for increased step length/clearance and single leg stability. Session limited due to pt's late arrival and needing to use restroom before/after session.  However, pt's BP WNL today as pt took her medicine. Pt demonstrated improved step length/amplitude of ball kicks on treadmill today but continues to require mod multimodal cues for increased length/amplitude of LLE > RLE. Pt frequently caught L foot on hurdles, educated pt to over-exaggerate step clearance on L side for improved performance and pt able to clear foot w/added cues. Continue POC.      OBJECTIVE IMPAIRMENTS Abnormal gait, decreased activity tolerance, decreased balance, decreased coordination, decreased endurance, decreased strength, impaired flexibility, and postural dysfunction.    ACTIVITY LIMITATIONS community activity and driving.    PERSONAL FACTORS Past/current experiences, Time since onset of injury/illness/exacerbation, and 1 comorbidity: osteoporosis  are also affecting patient's functional outcome.  REHAB POTENTIAL: Good   CLINICAL DECISION MAKING: Stable/uncomplicated   EVALUATION COMPLEXITY: Low   PLAN: PT FREQUENCY: 2x/week   PT DURATION: 12 weeks   PLANNED INTERVENTIONS: Therapeutic exercises, Therapeutic activity, Neuromuscular re-education, Balance training, Gait training, Patient/Family education, Stair training, and DME instructions   PLAN FOR NEXT SESSION:  10th visit PN. Check BP, GAIT TRAINING WITH POSTURE, HEEL STRIKE/ARM SWING. Treadmill training, obstacle navigation, retro gait. Work on larger amplitude movements. Balance on unlevel surfaces. SLS. UE/LE coordination, dual-tasks (cog/motor), pt requesting to work on leg strength (squats, eccentric heel taps). Plan to DC on 6/19     Deania Siguenza E Kirra Verga, PT, DPT  12/06/2021, 1:57 PM

## 2021-12-06 NOTE — Patient Instructions (Signed)

## 2021-12-06 NOTE — Therapy (Signed)
OUTPATIENT SPEECH LANGUAGE PATHOLOGY TREATMENT NOTE   Patient Name: Denise [name] MRN: [id number] DOB:[birth year], 71 y.o., female Today's Date: 12/06/2021  PCP: Minette Brine, FNP REFERRING PROVIDER: Melvenia Beam, MD   END OF SESSION:   End of Session - 12/06/21 1402     Visit Number 9    Number of Visits 17    Date for SLP Re-Evaluation 12/10/21    Authorization Type BCBS Medicare    SLP Start Time 1401    SLP Stop Time  9937    SLP Time Calculation (min) 44 min    Activity Tolerance Patient tolerated treatment well               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   Past Surgical History:  Procedure Laterality Date   TUBAL LIGATION     Patient Active Problem List   Diagnosis Date Noted   Parkinson's disease ([place]) 02/17/2020   Gait abnormality 12/04/2019   Prediabetes 12/03/2018   Vitamin D deficiency 12/03/2018   Anemia due to vitamin B12 deficiency 12/03/2018   Fatigue 12/03/2018   Urinary frequency 10/01/2018   Vitamin B12 deficiency 10/01/2018   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 No  OBJECTIVE:   TODAY'S TREATMENT:  12-06-21: Mildly reduced conversational volume exhibited today averaging upper 60s dB, which immediately improved with mild cues to speak out and be intentional. Brief warm up conducted via Speak Out! Workbook as quick tune up in 5 minutes prior to speaking engagement.  Pt completed all modified exercises with mod I to optimize intensity. For subsequent 30 minute conversation, pt averaged 71 dB with occasional min A to optimize vocal intensity in quiet environment. Pt able to successfully compete with added background noise and achieve targeted conversational volume with mod I.   12-01-21: Upper 60s dB exhibited in opening conversation, in which pt identified volume was reduced. Today's session focused on achieving and maintaining conversational speech volume (70-72 dB). Brief warm up conducted via Speak Out! Workbook, Lesson #23 with modifications. Pt completed all targeted exercises with rare min A to optimize intensity. For subsequent 15+ minute conversation, pt averaged 71 dB with rare min A to optimize vocal intensity. Pt able to successfully compete with added background noise and achieve targeted conversational volume with mod I.   11-30-21: Continues to complete HEP consistently and routinely, including online Speak Out! Exercises. Occasional cues required to optimize conversational volume in opening conversation. Conducted Lesson #22 today. ST leads pt through exercises providing rare model to aid pt execution and accuracy. Rare min-A required to achieve targeted dB this date. Averaged this date: loud "ah" 89 dB; reading 76 dB; cognitive speech task 73 dB. Subsequent conversational sample of approx 5 minutes, pt averaged low 70s dB with rare min-A to maintain targeted vocal intensity.  11-24-21: Pt returned after several week gap in therapy. Pt has completed HEP twice a  day almost every day since last ST session. Pt averaged upper 60s dB in opening conversation, which improved with cuing and modeling. Targeted improving vocal quality and increasing intensity through progressively difficulty speech tasks. Conducted Lesson #19 today. ST leads pt through exercises providing rare model to aid pt execution and accuracy. Rare min-A required to achieve targeted dB this date.  Averaged this date: loud "ah" 90 dB; reading 76 dB; cognitive speech task 72 dB with rare min A to optimize speaking with intensity due to complexity of cognitive task. Subsequent conversational sample of approx 5 minutes, pt averaged low 70s dB with occasional min-A to maintain targeted vocal intensity.  11-03-21: Pt entered with reduced sub-optimal conversational volume averaging mid 60s dB, which improved to targeted conversational volume with occasional min A. Conducted ongoing education and instruction of Speak Out! Program for Parkinson's Disease. Targeted improving vocal quality and increasing intensity through progressively difficulty speech tasks. Conducted Lesson #8 today. ST leads pt through exercises providing rare model to aid pt execution. Rare min-A required to achieve targeted dB this date. Averaged this date: loud "ah" 90 dB; reading 78 dB; cognitive speech task 77 dB. Subsequent conversational sample of approx 15 minutes, pt averaged low 70s dB with occasional min-A to maintain targeted vocal intensity.  PATIENT EDUCATION: Education details: see above Person educated: Patient Education method: Research scientist (physical sciences) Education comprehension: verbalized understanding, returned demonstration, and needs further education     HOME EXERCISE PROGRAM: Speak Out! Program      GOALS: Goals reviewed with patient? Yes   SHORT TERM GOALS: Target date: 11/12/2021   Pt will complete HEP given occasional min A over 3 sessions Baseline: 10-26-21, 10-28-21, 11-03-21 Goal status: MET   2.  Pt will utilize dysarthria compensations on structured speech tasks with 80% accuracy given occasional min A over 3 sessions Baseline: 10-26-21, 10-28-21, 11-03-21 Goal status: MET   3.  Pt will maintain WNL conversational volume (70-72 dB) in 5-10 minute conversation given occasional min A over 2 sessions Baseline: 10-26-21, 11-03-21 Goal status: MET   4.  Pt will utilize memory/attention compensations as  needed to optimize daily functioning and reduce caregiver burden given occasional min A over 2 sessions Baseline:  Goal status: DEFERRED     LONG TERM GOALS: Target date: 12/10/2021   Pt will complete HEP at least 1x/day (recommend BID) > 1 week Baseline:  Goal status: Met   2.  Pt will maintain WNL conversational volume (70-72 dB) in 10+ minute conversation given occasional min A over 2 sessions Baseline: 12-01-21, 12-06-21 Goal status: Met   3.  Pt will carryover memory/attention compensations as needed to optimize daily functioning and reduce caregiver burden given rare min A > 1 week Baseline:  Goal status: ongoing   4.  Pt will report improved communication effectiveness via PROM with 2 point increase by last ST session  Baseline: CPIB=21 Goal status: ongoing   ASSESSMENT:   CLINICAL IMPRESSION: Patient is a 71 y.o. 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 Conducted education and training of Speak Out! Program to optimize vocal intensity and clarity. Pt able to achieve targeted dB with rare min A on structured tasks and rare to occasional min A in conversation. Continue education and training of program to optimize patient understanding and carryover in conversation.    OBJECTIVE IMPAIRMENTS  Objective impairments include memory and dysarthria. These impairments are limiting patient from household responsibilities and effectively communicating at  home and in community.Factors affecting potential to achieve goals and functional outcome are medical prognosis. Patient will benefit from skilled SLP services to address above impairments and improve overall function.   REHAB POTENTIAL: Good   PLAN: SLP FREQUENCY: 2x/week   SLP DURATION: 8 weeks   PLANNED INTERVENTIONS: Language facilitation, Cueing hierachy, Cognitive reorganization, Internal/external aids, Functional tasks, Multimodal communication  approach, SLP instruction and feedback, Compensatory strategies, and Patient/family education     Marzetta Board, CCC-SLP 12/06/2021, 2:03 PM

## 2021-12-08 ENCOUNTER — Ambulatory Visit: Payer: Medicare Other | Admitting: Occupational Therapy

## 2021-12-08 ENCOUNTER — Ambulatory Visit: Payer: Medicare Other

## 2021-12-08 ENCOUNTER — Ambulatory Visit: Payer: Medicare Other | Admitting: Physical Therapy

## 2021-12-08 DIAGNOSIS — R29898 Other symptoms and signs involving the musculoskeletal system: Secondary | ICD-10-CM | POA: Diagnosis not present

## 2021-12-08 DIAGNOSIS — R2689 Other abnormalities of gait and mobility: Secondary | ICD-10-CM

## 2021-12-08 DIAGNOSIS — M6281 Muscle weakness (generalized): Secondary | ICD-10-CM

## 2021-12-08 DIAGNOSIS — R278 Other lack of coordination: Secondary | ICD-10-CM | POA: Diagnosis not present

## 2021-12-08 DIAGNOSIS — R29818 Other symptoms and signs involving the nervous system: Secondary | ICD-10-CM

## 2021-12-08 DIAGNOSIS — R41841 Cognitive communication deficit: Secondary | ICD-10-CM

## 2021-12-08 DIAGNOSIS — R471 Dysarthria and anarthria: Secondary | ICD-10-CM

## 2021-12-08 DIAGNOSIS — R2681 Unsteadiness on feet: Secondary | ICD-10-CM

## 2021-12-08 DIAGNOSIS — M25612 Stiffness of left shoulder, not elsewhere classified: Secondary | ICD-10-CM | POA: Diagnosis not present

## 2021-12-08 DIAGNOSIS — R293 Abnormal posture: Secondary | ICD-10-CM

## 2021-12-08 NOTE — Therapy (Signed)
OUTPATIENT SPEECH LANGUAGE PATHOLOGY TREATMENT NOTE (PROGRESS NOTE/DISCHARGE)   Patient Name: Denise [name] MRN: [id number] DOB:01/02/1951, 71 y.o., female Today's Date: 12/08/2021  PCP: Minette Brine, FNP REFERRING PROVIDER: Melvenia Beam, MD   END OF SESSION:   End of Session - 12/08/21 1351     Visit Number 10    Number of Visits 17    Date for SLP Re-Evaluation 12/10/21    Authorization Type BCBS Medicare    SLP Start Time 1400    SLP Stop Time  7408    SLP Time Calculation (min) 39 min    Activity Tolerance Patient tolerated treatment well             SPEECH THERAPY DISCHARGE SUMMARY  Visits from Start of Care: 10  Current functional level related to goals / functional outcomes: Diane has demonstrated good carryover of targeted dysarthria compensations and implementation of Speak Out! Program/HEP to maximize vocal intensity and clarity. Pt benefits from rare to occasional cues to optimize volume in extended conversation. Pt is pleased with current progress and agreeable to ST discharge this date.    Remaining deficits: PD  Education / Equipment: Speak Out! Program, PD education   Patient agrees to discharge. Patient goals were met. Patient is being discharged due to meeting the stated rehab goals.  Speech Therapy Progress Note  Dates of Reporting Period: 10-12-21 to current  Objective Reports of Subjective Statement: Pt has been seen for 10 ST visits targeting dysarthria with good carryover of trained strategies exhibited. No cognitive concerns reported or addressed this ST intervention.   Objective Measurements: Speak Out! Program   Goal Update: see below  Plan: d/c today  Reason Skilled Services are Required: Pt met all ST goals and discharging today.    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    Past Surgical History:  Procedure Laterality Date   TUBAL LIGATION     Patient Active Problem List   Diagnosis Date Noted   Parkinson's disease ([place]) 02/17/2020   Gait abnormality 12/04/2019   Prediabetes 12/03/2018   Vitamin D deficiency 12/03/2018   Anemia due to vitamin B12 deficiency 12/03/2018   Fatigue 12/03/2018   Urinary frequency 10/01/2018   Vitamin B12 deficiency 10/01/2018   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 No  OBJECTIVE:   TODAY'S TREATMENT:  12-08-21: Pt entered with WNL volume and clarity and able to maintain conversational level in extended conversation with rare to occasional min A. Short warm up conducted via Speak Out! Exercises and pt achieved targeted dB levels. Re-administered CPIB PROM with score of  30 (9 point improvement since evaluation). Pt is pleased with current progress and has met all ST goals. Pt denied further questions at this time.   12-06-21: Mildly reduced conversational volume exhibited today averaging upper 60s dB, which immediately improved with mild cues to speak out and be intentional. Brief warm up conducted  via Speak Out! Workbook as quick tune up in 5 minutes prior to speaking engagement. Pt completed all modified exercises with mod I to optimize intensity. For subsequent 30 minute conversation, pt averaged 71 dB with occasional min A to optimize vocal intensity in quiet environment. Pt able to successfully compete with added background noise and achieve targeted conversational volume with mod I.   12-01-21: Upper 60s dB exhibited  in opening conversation, in which pt identified volume was reduced. Today's session focused on achieving and maintaining conversational speech volume (70-72 dB). Brief warm up conducted via Speak Out! Workbook, Lesson #23 with modifications. Pt completed all targeted exercises with rare min A to optimize intensity. For subsequent 15+ minute conversation, pt averaged 71 dB with rare min A to optimize vocal intensity. Pt able to successfully compete with added background noise and achieve targeted conversational volume with mod I.   11-30-21: Continues to complete HEP consistently and routinely, including online Speak Out! Exercises. Occasional cues required to optimize conversational volume in opening conversation. Conducted Lesson #22 today. ST leads pt through exercises providing rare model to aid pt execution and accuracy. Rare min-A required to achieve targeted dB this date. Averaged this date: loud "ah" 89 dB; reading 76 dB; cognitive speech task 73 dB. Subsequent conversational sample of approx 5 minutes, pt averaged low 70s dB with rare min-A to maintain targeted vocal intensity.  11-24-21: Pt returned after several week gap in therapy. Pt has completed HEP twice a day almost every day since last ST session. Pt averaged upper 60s dB in opening conversation, which improved with cuing and modeling. Targeted improving vocal quality and increasing intensity through progressively difficulty speech tasks. Conducted Lesson #19 today. ST leads pt through exercises providing rare model to aid pt execution and accuracy. Rare min-A required to achieve targeted dB this date. Averaged this date: loud "ah" 90 dB; reading 76 dB; cognitive speech task 72 dB with rare min A to optimize speaking with intensity due to complexity of cognitive task. Subsequent conversational sample of approx 5 minutes, pt averaged low 70s dB with occasional min-A to maintain targeted vocal intensity.  11-03-21: Pt entered with reduced  sub-optimal conversational volume averaging mid 60s dB, which improved to targeted conversational volume with occasional min A. Conducted ongoing education and instruction of Speak Out! Program for Parkinson's Disease. Targeted improving vocal quality and increasing intensity through progressively difficulty speech tasks. Conducted Lesson #8 today. ST leads pt through exercises providing rare model to aid pt execution. Rare min-A required to achieve targeted dB this date. Averaged this date: loud "ah" 90 dB; reading 78 dB; cognitive speech task 77 dB. Subsequent conversational sample of approx 15 minutes, pt averaged low 70s dB with occasional min-A to maintain targeted vocal intensity.  PATIENT EDUCATION: Education details: see above Person educated: Patient Education method: Research scientist (physical sciences) Education comprehension: verbalized understanding, returned demonstration, and needs further education     HOME EXERCISE PROGRAM: Speak Out! Program      GOALS: Goals reviewed with patient? Yes   SHORT TERM GOALS: Target date: 11/12/2021   Pt will complete HEP given occasional min A over 3 sessions Baseline: 10-26-21, 10-28-21, 11-03-21 Goal status: MET   2.  Pt will utilize dysarthria compensations on structured speech tasks with 80% accuracy given occasional min A over 3 sessions Baseline: 10-26-21, 10-28-21, 11-03-21 Goal status: MET   3.  Pt will maintain WNL conversational volume (70-72 dB) in 5-10 minute conversation given occasional min A over 2 sessions  Baseline: 10-26-21, 11-03-21 Goal status: MET   4.  Pt will utilize memory/attention compensations as needed to optimize daily functioning and reduce caregiver burden given occasional min A over 2 sessions Baseline:  Goal status: DEFERRED     LONG TERM GOALS: Target date: 12/10/2021   Pt will complete HEP at least 1x/day (recommend BID) > 1 week Baseline:  Goal status: MET   2.  Pt will maintain WNL conversational volume (70-72 dB)  in 10+ minute conversation given occasional min A over 2 sessions Baseline: 12-01-21, 12-06-21 Goal status: MET   3.  Pt will carryover memory/attention compensations as needed to optimize daily functioning and reduce caregiver burden given rare min A > 1 week Baseline:  Goal status: DEFERRED   4.  Pt will report improved communication effectiveness via PROM with 2 point increase by last ST session  Baseline: CPIB=21; d/c=30 Goal status: MET  ASSESSMENT:   CLINICAL IMPRESSION: Patient is a 71 y.o. 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 Completed education and training of Speak Out! Program to optimize vocal intensity and clarity. Pt able to achieve targeted dB with rare min A on structured tasks and rare to occasional min A in conversation. Pt denies any overt cognitive concerns at this time. Pt is pleased with current progress and agreeable to ST discharge this date.    OBJECTIVE IMPAIRMENTS  Objective impairments include memory and dysarthria. These impairments are limiting patient from household responsibilities and effectively communicating at home and in community.Factors affecting potential to achieve goals and functional outcome are medical prognosis. Patient will benefit from skilled SLP services to address above impairments and improve overall function.   REHAB POTENTIAL: Good   PLAN: SLP FREQUENCY: 2x/week   SLP DURATION: 8 weeks   PLANNED INTERVENTIONS: Language facilitation, Cueing hierachy, Cognitive reorganization, Internal/external aids, Functional tasks, Multimodal communication approach, SLP instruction and feedback, Compensatory strategies, and Patient/family education     Marzetta Board, CCC-SLP 12/08/2021, 2:42 PM

## 2021-12-08 NOTE — Patient Instructions (Addendum)
  WALKING   Walking is a great form of exercise to increase your strength, endurance and overall fitness.  A walking program can help you start slowly and gradually build endurance as you go.  Everyone's ability is different, so each person's starting point will be different.  You do not have to follow them exactly.  The are just samples. You should simply find out what's right for you and stick to that program.   In the beginning, you'll start off walking 2-3 times a day for short distances.  As you get stronger, you'll be walking further at just 1-2 times per day.   A.         You Can Walk For A Certain Length Of Time Each Day                          Walk 5 minutes 3 times per day.             Increase 1-2 minutes every 3-4 days (3 times per day).             Work up to 20-25 minutes (1-2 times per day).               Example:                         Day 1-2           5 minutes        3 times per day                         Day 7-8           8-10 minutes   2-3 times per day                         Day 20-21       15-18 minutes 1-2 times per day   FOCUS on tall posture, looking ahead, making sure to pick up feet big; this should help with arm swing and overall better balance.

## 2021-12-08 NOTE — Therapy (Signed)
OUTPATIENT PHYSICAL THERAPY TREATMENT NOTE/10th VISIT PN   Patient Name: Denise [name] MRN: [id number] DOB:May 31, 1951, 71 y.o., female Today's Date: 12/08/2021  PCP: Minette Brine, [place] REFERRING PROVIDER: Dr. Jaynee Eagles  10th Visit Physical Therapy Progress Note  Dates of Reporting Period: 10/12/21 to 12/08/21      PT End of Session - 12/08/21 1452     Visit Number 10    Number of Visits 13    Date for PT Re-Evaluation 01/11/22   due to delay in scheduling   Authorization Type BCBS Medicare    PT Start Time 1449    PT Stop Time 1530    PT Time Calculation (min) 41 min    Equipment Utilized During Treatment Gait belt    Activity Tolerance Patient tolerated treatment well    Behavior During Therapy [facility] for tasks assessed/performed                 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   Past Surgical History:  Procedure Laterality Date   TUBAL LIGATION     Patient Active Problem List   Diagnosis Date Noted   Parkinson's disease ([place]) 02/17/2020   Gait abnormality 12/04/2019   Prediabetes 12/03/2018   Vitamin D deficiency 12/03/2018   Anemia due to vitamin B12 deficiency 12/03/2018   Fatigue 12/03/2018   Urinary frequency 10/01/2018   Vitamin B12 deficiency 10/01/2018   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 Pt's daughter and granddaughter present today for education/review of exercises.   PAIN:  Are you having pain? No   There were no vitals filed for this visit.   TODAY'S TREATMENT:  Goal Assessment: Gait speed: 11.06 seconds = 2.96 ft/sec   Educated pt's daughter on cues to give pt with gait for tall posture/looking ahead and foot clearance/stride length and arm swing and importance of having bigger movement patterns during gait in regards to PD.     NMR  Pt performs PWR! Moves in standing position x10 reps as review of HEP.    PWR! Up for improved posture  PWR! Rock for improved weighshifting  PWR! Twist for improved trunk rotation   PWR! Step for improved step initiation   Cues provided for extending elbows and opening hands throughout and incr amplitude of movement patterns. Discussed working on making sure she works at an effort level of 8/10 when performing. Educated pt and pt's daughter on purpose of performing and how it relates to function.    Access Code: [email or web address] URL: [email or web address] Date: 10/19/2021 Prepared by: [name]  Reviewed exercises for balance with pt's daughter present, also showed where to purchase balance pad from [employer].   Exercises - Alternating Step Backward with Support  - 1 x daily -  5 x weekly - 1-2 sets - 10 reps - Romberg Stance Eyes Closed on Foam Pad  - 1 x daily - 5 x weekly - 3 sets - 30 hold  Plus standing tall against the wall for posture - 2 x 30 second holds  Discussed cues throughout this exercise and during daily life for pt to maintain midline positioning vs. Leaning over to the L (this is something that pt's daughter has been cueing her as well).       PATIENT EDUCATION: Education details: Reviewed entirety of HEP for balance/PWR moves, reviewed walking program with focus on tall posture, foot clearance, and arm swing and provided new handout. Provided  new handout on community resources and went over with pt's daughter. Proper cues to give pt for posture and gait training. Scheduling PD evals in 6 months.  Person educated: Patient and pt's daughter and granddaughter.  Education method: Explanation/Demo and Handout Education comprehension: verbalized and demonstrated understanding     HOME EXERCISE PROGRAM: Standing and seated PWR, Pt has in her exercise folder.    Access Code: [email or web address] URL: [email or web address] Date: 10/19/2021 Prepared by: [name]  Reviewed/condensed exercises for balance:   Exercises - Alternating Step Backward with Support  - 1 x daily - 5 x weekly - 1-2 sets - 10 reps - Romberg Stance Eyes Closed on Foam Pad  - 1 x daily - 5 x weekly - 3 sets - 30 hold  Plus standing tall against the wall for posture - 2 x 30 second holds        GOALS: Goals reviewed with patient? Yes   SHORT TERM GOALS: Target date: 11/10/2021   Pt will improve 5x sit<>stand to less than or equal to 13.5 sec w/o UE support to demonstrate improved functional strength and transfer efficiency.    Baseline: 15.94 seconds; 15.03s without UE support  Goal status: NOT MET    2.  Pt will improve manual TUG time to 13.5 seconds or less in order to demo decrease fall risk.   Baseline: 15.4 sec; 12.85s on 5/24 Goal status: MET    3.  Pt will undergo further assessment of 6MWT with LTG written.      Baseline: 1217' with RPE 8/10       Goal status: MET       LONG TERM GOALS: Target date: 12/20/2021 (Extended due to delay in scheduling)    Pt will be independent with final PD HEP for improved strength, balance, transfers, and gait Baseline: reviewed entirety of HEP with pt and pt's family on 12/08/21 Goal status: MET   2.  Pt will improve MiniBESTest score to at least 22/28 for decreased fall risk.    Baseline: 18/28 Goal status: INITIAL   3.  Pt will improve TUG cognitive score to less than or equal to 15 seconds  for decreased fall risk and improved dual tasking.    Baseline: 18.75 sec Goal status: INITIAL   4.  Pt will improve 6MWT to at least 1250' with improved gait mechanics (arm swing, posture, and incr heel strike) and RPE to 6/10 or less in order to demo improved gait efficiency/endurance. Baseline: 1217' with RPE 8/10 Goal status: INITIAL   5.  Pt will improve gait speed with no AD to at least 3.2 ft/sec in order to demo improved community mobility.    Baseline: 2.77 ft/sec; Gait speed: 11.06 seconds = 2.96 ft/sec on 12/08/21 Goal status: NOT MET      ASSESSMENT:  CLINICAL IMPRESSION: 10th visit PN: Pt's daughter and granddaughter present today during session for education on cues regarding gait and posture at home, education, and reviewing entirety of pt's HEP. Pt has been consistently performing her PWR moves at home. Pt does still need cues for larger amplitude movements during gait esp with BUE arm swing. Pt improved gait speed today to 2.96 ft/sec (previously was 2.77 ft/sec), but not to goal level. Will plan to D/C at next session with pt in agreement with plan and scheduling for return PD evals in 6 months.      OBJECTIVE IMPAIRMENTS Abnormal gait, decreased activity tolerance, decreased balance, decreased coordination, decreased endurance, decreased strength, impaired flexibility, and postural dysfunction.    ACTIVITY LIMITATIONS community activity and driving.    PERSONAL FACTORS Past/current experiences, Time since onset of injury/illness/exacerbation, and 1 comorbidity: osteoporosis  are also affecting patient's functional outcome.      REHAB POTENTIAL: Good   CLINICAL DECISION MAKING: Stable/uncomplicated   EVALUATION COMPLEXITY: Low   PLAN: PT FREQUENCY: 2x/week   PT DURATION: 12 weeks   PLANNED INTERVENTIONS: Therapeutic exercises, Therapeutic activity, Neuromuscular re-education, Balance training, Gait training, Patient/Family education, Stair training, and DME  instructions   PLAN FOR NEXT SESSION:  check remainder of goals and plan for D/C!     Arliss Journey, PT, DPT  12/08/2021, 4:37 PM

## 2021-12-08 NOTE — Therapy (Signed)
OUTPATIENT OCCUPATIONAL THERAPY TREATMENT NOTE   Patient Name: Denise [name] MRN: [id number] DOB:1951-02-03, 71 y.o., female Today's Date: 12/08/2021  PCP: Minette Brine, FNP REFERRING PROVIDER:  Dr. Jaynee Eagles       OCCUPATIONAL THERAPY DISCHARGE SUMMARY    Current functional level related to goals / functional outcomes: Pt made progress towards goals however she did not fully achieve all goals. Pt progress may have been impacted by frequency of 1x due to pt financial concerns and pt reports she forgot her mid day dosage of sinemet.   Remaining deficits: Bradykensia, abnormal posture, decreased balance, cognitive deficits, decreased functional mobility, decreased coordination   Education / Equipment: Pt was instructed in the following:HEP, adapted strategies for ADLs, and ways to prevent future complications.   Patient agrees to discharge. Patient goals were partially met. Patient is being discharged due to being pleased with the current functional level..    END OF SESSION:   OT End of Session - 12/08/21 1322     Visit Number 7    Number of Visits 9    Date for OT Re-Evaluation 12/14/21    Authorization Type BCBS Medicare:  no auth, no visit limit $40 copay-    Authorization Time Period 8 weeks, however pt is only scheduled for 6 weeks    Authorization - Visit Number 7    Progress Note Due on Visit 10    OT Start Time 1322    OT Stop Time 1400    OT Time Calculation (min) 38 min                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   Past Surgical History:  Procedure Laterality Date   TUBAL LIGATION     Patient Active Problem List   Diagnosis Date Noted   Parkinson's disease ([place]) 02/17/2020   Gait abnormality 12/04/2019   Prediabetes 12/03/2018   Vitamin D deficiency 12/03/2018   Anemia due to  vitamin B12 deficiency 12/03/2018   Fatigue 12/03/2018   Urinary frequency 10/01/2018   Vitamin B12 deficiency 10/01/2018   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   Abnormal auditory perception of both ears 05/10/2016   Osteopenia 08/23/2012    ONSET DATE: 09/27/21    REFERRING DIAG: 09/27/21   THERAPY DIAG: Parkinson's disease Abnormal posture  Muscle weakness (generalized)  Other symptoms and signs involving the nervous system  Other symptoms and signs involving the musculoskeletal system  Other lack of coordination  Stiffness of left shoulder, not elsewhere classified  Unsteadiness on feet  Other abnormalities of gait and mobility   PERTINENT HISTORY: returns to occupational therapy for Parkinson's disease for follow up eval as recommended when last d/c. PMH: ostopenia, glaucoma  PRECAUTIONS: fall  SUBJECTIVE:denies pain  PAIN:  Are you having pain? No     TODAY'S TREATMENT:  Sitting closed-chain shoulder flex and chest press with ball  min v.c  for large amplitude movements and amplitude.  PWR! Basic 4 in seated , 10 reps each min v.c, and demonstration for amplitude/ posture. Checked progress towards goals. Pt did not fully achieve goals  today. Times were slower possibly as pt forgot her mid day dose of sinemet.     PATIENT EDUCATION: 12/01/22-Education details: ways to prevent  future PD complications, PWR! Moves basic 4 seated, coordination HEP, PWR! Hands basic 4 Person educated:  patient Education method:  explanation, demo, handout Education comprehension:  verbalized understanding, returned demo, cueing needed    GOALS:     SHORT TERM GOALS: Target date: 11/09/2021   I with PD specific HEP  Goal status: Met   2.  Pt will verbalize understanding of adapted strategies to maximize safety and I with ADLs/ IADLs .  Goal status: MET   3.  Pt will demonstrate improved fine motor coordination for ADLs as  evidenced by decreasing 9 hole peg test score for LUE by 3 secs   Baseline: R 23.31, L 35.72 Goal status: not met--55 secs, 31.44, not consistent   4.  Pt will verbalize understanding of ways to prevent future PD related complications    Goal status:  MET     LONG TERM GOALS: Target date: 12/14/21 anticipate d/c after 6 weeks dependent on progress   Pt will perform functional activity for at least 6 min while maintaining upright posture/avoiding leaning to L.  Baseline: pt leans to left side Goal status:  not met inconsistent   2.  Pt will demonstrate improved ease with fastening buttons as evidenced by decreasing 3 button/ unbutton time to :39 secs or less     Baseline: 42.94 Goal status: not met, 1 min 14 secs   3.  Pt will demonstrate ability to retrieve a lightweight object at 140 shoulder flexion and -5 elbow extension with LUE   Baseline: LUE 130, -10 Goal status: partially met-140, -10   4. Pt will demonstrate improved LUE functional use for ADLs as evidenced by increasing box/ blocks score by 3 blocks with LUE. Baseline: RUE 52, LUE 48 Goal status:not met, 34 blocks, 45 blocks     ASSESSMENT:   CLINICAL IMPRESSION: Pt has made progress towards goals but she did not fully achieve all goals . Pt forgot to take her mid day does of sinemet and it may have impacted her her speed with testing today.   PERFORMANCE DEFICITS in functional skills including ADLs, IADLs, coordination, dexterity, ROM, strength, flexibility, FMC, GMC, mobility, balance, endurance, decreased knowledge of precautions, decreased knowledge of use of DME, and UE functional use, cognitive skills including attention, memory, problem solving, safety awareness, and thought, and psychosocial skills including coping strategies, environmental adaptation, and routines and behaviors.    IMPAIRMENTS are limiting patient from ADLs, IADLs, play, leisure, and social participation.    COMORBIDITIES may have  co-morbidities  that affects occupational performance. Patient will benefit from skilled OT to address above impairments and improve overall function.   MODIFICATION OR ASSISTANCE TO COMPLETE EVALUATION: No modification of tasks or assist necessary to complete an evaluation.   OT OCCUPATIONAL PROFILE AND HISTORY: Problem focused assessment: Including review of records relating to presenting problem.   CLINICAL DECISION MAKING: LOW - limited treatment options, no task modification necessary   REHAB POTENTIAL: Good   EVALUATION COMPLEXITY: Low        PLAN: OT FREQUENCY: 1x/week   OT DURATION: 8 weeks plus eval    PLANNED INTERVENTIONS: self care/ADL training, therapeutic exercise, therapeutic activity, neuromuscular re-education, manual therapy, passive range of motion, gait training, balance training, functional mobility training, splinting, ultrasound, paraffin, fluidotherapy, moist heat, patient/family education, cognitive remediation/compensation, energy conservation, coping strategies training, and DME and/or AE instructions   RECOMMENDED OTHER SERVICES: n/a   CONSULTED AND AGREED WITH PLAN OF CARE: Patient   PLAN FOR NEXT SESSION:  d/c OT, evals in 6 mons- pt is in agreement Lavi Sheehan, OT 12/08/2021, 3:27 PM Theone Murdoch, OTR/L [phone] Phone: [phone] 3:27 PM 12/08/21

## 2021-12-13 ENCOUNTER — Ambulatory Visit: Payer: Medicare Other | Admitting: Physical Therapy

## 2021-12-13 ENCOUNTER — Encounter: Payer: Medicare Other | Admitting: Occupational Therapy

## 2021-12-15 ENCOUNTER — Encounter: Payer: Medicare Other | Admitting: Occupational Therapy

## 2021-12-15 ENCOUNTER — Encounter: Payer: Medicare Other | Admitting: Speech Pathology

## 2021-12-15 ENCOUNTER — Ambulatory Visit: Payer: Medicare Other | Admitting: Physical Therapy

## 2021-12-20 ENCOUNTER — Encounter: Payer: Medicare Other | Admitting: Occupational Therapy

## 2021-12-20 ENCOUNTER — Ambulatory Visit: Payer: Medicare Other | Admitting: Physical Therapy

## 2021-12-20 ENCOUNTER — Encounter: Payer: Self-pay | Admitting: Physical Therapy

## 2021-12-20 DIAGNOSIS — R29898 Other symptoms and signs involving the musculoskeletal system: Secondary | ICD-10-CM | POA: Diagnosis not present

## 2021-12-20 DIAGNOSIS — R471 Dysarthria and anarthria: Secondary | ICD-10-CM | POA: Diagnosis not present

## 2021-12-20 DIAGNOSIS — M6281 Muscle weakness (generalized): Secondary | ICD-10-CM | POA: Diagnosis not present

## 2021-12-20 DIAGNOSIS — R293 Abnormal posture: Secondary | ICD-10-CM

## 2021-12-20 DIAGNOSIS — R278 Other lack of coordination: Secondary | ICD-10-CM | POA: Diagnosis not present

## 2021-12-20 DIAGNOSIS — R29818 Other symptoms and signs involving the nervous system: Secondary | ICD-10-CM

## 2021-12-20 DIAGNOSIS — R2689 Other abnormalities of gait and mobility: Secondary | ICD-10-CM | POA: Diagnosis not present

## 2021-12-20 DIAGNOSIS — R2681 Unsteadiness on feet: Secondary | ICD-10-CM

## 2021-12-20 DIAGNOSIS — M25612 Stiffness of left shoulder, not elsewhere classified: Secondary | ICD-10-CM | POA: Diagnosis not present

## 2021-12-20 DIAGNOSIS — R41841 Cognitive communication deficit: Secondary | ICD-10-CM | POA: Diagnosis not present

## 2021-12-20 NOTE — Therapy (Signed)
OUTPATIENT PHYSICAL THERAPY TREATMENT NOTE/DISCHARGE SUMMARY   Patient Name: Denise [name] MRN: [id number] DOB:[birth year], 71 y.o., female Today's Date: 12/20/2021  PCP: Minette Brine, FNP REFERRING PROVIDER: Dr. Jaynee Eagles      PT End of Session - 12/20/21 1321     Visit Number 11    Number of Visits 13    Date for PT Re-Evaluation 01/11/22   due to delay in scheduling   Authorization Type BCBS Medicare    PT Start Time 1319   pt in restroom at start of session   PT Stop Time 1354   full time not used due to D/C   PT Time Calculation (min) 35 min    Activity Tolerance Patient tolerated treatment well    Behavior During Therapy [facility] for tasks assessed/performed                 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   Past Surgical History:  Procedure Laterality Date   TUBAL LIGATION     Patient Active Problem List   Diagnosis Date Noted   Parkinson's disease ([place]) 02/17/2020   Gait abnormality 12/04/2019   Prediabetes 12/03/2018   Vitamin D deficiency 12/03/2018   Anemia due to vitamin B12 deficiency 12/03/2018   Fatigue 12/03/2018   Urinary frequency 10/01/2018   Vitamin B12 deficiency 10/01/2018   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   PAIN:  Are you  having pain? No   There were no vitals filed for this visit.   TODAY'S TREATMENT:  Goal Assessment: 5x sit <> stand: 12.95 seconds    [facility] PT Assessment - 12/20/21 1323       6 minute walk test results    Aerobic Endurance Distance Walked 1358    Endurance additional comments reports RPE as 7/10      Mini-BESTest   Sit To Stand Normal: Comes to stand without use of hands and stabilizes independently.    Rise to Toes Normal: Stable for 3 s with maximum height.    Stand on one leg (left) Moderate: < 20 s   1.9, 2.5   Stand on one leg (right) Moderate: < 20 s   3.4, 7.5   Stand on one leg - lowest score 1    Compensatory Stepping Correction - Forward Normal: Recovers independently with a single, large step (second realignement is allowed).    Compensatory Stepping Correction - Backward Normal: Recovers independently with a single, large step    Compensatory Stepping Correction - Left Lateral Moderate: Several steps to recover equilibrium    Compensatory Stepping Correction - Right Lateral Moderate: Several steps to recover equilibrium    Stepping Corredtion Lateral - lowest score 1    Stance - Feet together, eyes  open, firm surface  Normal: 30s    Stance - Feet together, eyes closed, foam surface  Moderate: < 30s    Incline - Eyes Closed Normal: Stands independently 30s and aligns with gravity    Change in Gait Speed Normal: Significantly changes walkling speed without imbalance    Walk with head turns - Horizontal Moderate: performs head turns with reduction in gait speed.    Walk with pivot turns Moderate:Turns with feet close SLOW (>4 steps) with good balance.    Step over obstacles Normal: Able to step over box with minimal change of gait speed and with good balance.    Timed UP & GO with Dual Task Moderate: Dual Task affects either counting OR walking (>10%) when compared to the TUG without Dual Task.    Mini-BEST total score 22      Timed Up and Go Test   Normal TUG  (seconds) 12.09    Cognitive TUG (seconds) 15             PHYSICAL THERAPY DISCHARGE SUMMARY  Visits from Start of Care: 11  Current functional level related to goals / functional outcomes: See LTGs/Clinical Assessment Statement   Remaining deficits: Bradykinesia, postural abnormalities, impaired timing/coordination of gait, impaired balance.   Education / Equipment: HEP, community resources.    Patient agrees to discharge. Patient goals were met. Patient is being discharged due to meeting the stated rehab goals. Plan for return PD evals in 6 months.         PATIENT EDUCATION: Education details: Results of LTGs and pt's progress. Importance of continuing to work on [employer] and walking. Plan for return PD evals in 6 months.  Person educated: Patient  Education method: Explanation Education comprehension: verbalized understanding     HOME EXERCISE PROGRAM: Standing and seated PWR, Pt has in her exercise folder.    Access Code: [email or web address] URL: [email or web address] Date: 10/19/2021 Prepared by: [name]  Reviewed/condensed exercises for balance:   Exercises - Alternating Step Backward with Support  - 1 x daily - 5 x weekly - 1-2 sets - 10 reps - Romberg Stance Eyes Closed on Foam Pad  - 1 x daily - 5 x weekly - 3 sets - 30 hold  Plus standing tall against the wall for posture - 2 x 30 second holds        GOALS: Goals reviewed with patient? Yes   SHORT TERM GOALS: Target date: 11/10/2021   Pt will improve 5x sit<>stand to less than or equal to 13.5 sec w/o UE support to demonstrate improved functional strength and transfer efficiency.    Baseline: 15.94 seconds; 15.03s without UE support  Goal status: NOT MET    2.  Pt will improve manual TUG time to 13.5 seconds or less in order to demo decrease fall risk.   Baseline: 15.4 sec; 12.85s on 5/24 Goal status: MET    3.  Pt will undergo further assessment of 6MWT with LTG written.       Baseline: 1217' with RPE 8/10       Goal status: MET       LONG TERM GOALS: Target date: 12/20/2021 (Extended due to delay in scheduling)    Pt will be independent with final PD HEP for improved strength, balance, transfers, and gait Baseline: reviewed entirety of HEP with pt and pt's family on 12/08/21 Goal status: MET   2.  Pt will improve MiniBESTest score to at least 22/28 for decreased fall risk.  Baseline: 18/28; 22/28 on 12/20/21 Goal status: MET   3.  Pt will improve TUG cognitive score to less than or equal to 15 seconds for decreased fall risk and improved dual tasking.    Baseline: 18.75 sec; 15 sec on 12/20/21 Goal status: MET   4.  Pt will improve 6MWT to at least 1250' with improved gait mechanics (arm swing, posture, and incr heel strike) and RPE to 6/10 or less in order to demo improved gait efficiency/endurance. Baseline: 1217' with RPE 8/10  1358' with RPE 7/10 Goal status: PARTIALLY MET   5.  Pt will improve gait speed with no AD to at least 3.2 ft/sec in order to demo improved community mobility.    Baseline: 2.77 ft/sec; Gait speed: 11.06 seconds = 2.96 ft/sec on 12/08/21 Goal status: NOT MET      ASSESSMENT:   CLINICAL IMPRESSION: Today's skilled session focused on assessing remainder of pt's LTGs. Pt has met LTGs #1-3, partially met LTG #4, and did not meet LTG  #5. Pt significant improved her 6MWT distance to 1358' today (was 1217'), indicating improved endurance and gait efficiency. Pt improved miniBEST to a 22/28 (was 18/28), indicating decr fall risk. Pt is pleased with her progress with PT and will continue to work on her HEP to maintain gains. Will plan to D/C at this time with return PD evals in 6 months. Pt in agreement with plan.      OBJECTIVE IMPAIRMENTS Abnormal gait, decreased activity tolerance, decreased balance, decreased coordination, decreased endurance, decreased strength, impaired flexibility, and postural dysfunction.    ACTIVITY  LIMITATIONS community activity and driving.    PERSONAL FACTORS Past/current experiences, Time since onset of injury/illness/exacerbation, and 1 comorbidity: osteoporosis  are also affecting patient's functional outcome.      REHAB POTENTIAL: Good   CLINICAL DECISION MAKING: Stable/uncomplicated   EVALUATION COMPLEXITY: Low   PLAN: PT FREQUENCY: 2x/week   PT DURATION: 12 weeks   PLANNED INTERVENTIONS: Therapeutic exercises, Therapeutic activity, Neuromuscular re-education, Balance training, Gait training, Patient/Family education, Stair training, and DME instructions   PLAN FOR NEXT SESSION:  D/C     Arliss Journey, PT, DPT  12/20/2021, 1:59 PM

## 2021-12-22 ENCOUNTER — Encounter: Payer: Medicare Other | Admitting: Speech Pathology

## 2021-12-22 ENCOUNTER — Ambulatory Visit: Payer: Medicare Other | Admitting: Physical Therapy

## 2021-12-22 ENCOUNTER — Encounter: Payer: Medicare Other | Admitting: Occupational Therapy

## 2021-12-25 ENCOUNTER — Other Ambulatory Visit: Payer: Self-pay | Admitting: Neurology

## 2021-12-27 ENCOUNTER — Telehealth: Payer: Medicare Other | Admitting: Neurology

## 2021-12-27 DIAGNOSIS — G20 Parkinson's disease: Secondary | ICD-10-CM | POA: Diagnosis not present

## 2021-12-27 MED ORDER — CARBIDOPA-LEVODOPA 25-100 MG PO TABS: ORAL_TABLET | ORAL | 4 refills | Status: DC

## 2022-01-13 DIAGNOSIS — H401112 Primary open-angle glaucoma, right eye, moderate stage: Secondary | ICD-10-CM | POA: Diagnosis not present

## 2022-01-13 DIAGNOSIS — H47232 Glaucomatous optic atrophy, left eye: Secondary | ICD-10-CM | POA: Diagnosis not present

## 2022-01-13 DIAGNOSIS — H401123 Primary open-angle glaucoma, left eye, severe stage: Secondary | ICD-10-CM | POA: Diagnosis not present

## 2022-01-17 ENCOUNTER — Encounter (HOSPITAL_BASED_OUTPATIENT_CLINIC_OR_DEPARTMENT_OTHER): Payer: Self-pay | Admitting: Cardiovascular Disease

## 2022-01-17 ENCOUNTER — Ambulatory Visit (HOSPITAL_BASED_OUTPATIENT_CLINIC_OR_DEPARTMENT_OTHER): Payer: Medicare Other | Admitting: Cardiovascular Disease

## 2022-01-17 VITALS — BP 108/66 | HR 58 | Ht 63.0 in | Wt 126.6 lb

## 2022-01-17 DIAGNOSIS — R001 Bradycardia, unspecified: Secondary | ICD-10-CM | POA: Diagnosis not present

## 2022-01-17 HISTORY — DX: Bradycardia, unspecified: R00.1

## 2022-01-17 NOTE — Assessment & Plan Note (Signed)
Ms. Denise [name] has heart rates in the 70s.  She his asymptomatic.  She has no symptoms of bradycardia.  She doesn't have symptoms of OSA.  We will check a TSH.  We did discuss that her timolol could be affecting her heart rate.  Given that she is asymptomatic, we will not ask her to stop her eyedrops.  If she does develop symptoms would be worth stopping this before considering a pacemaker.

## 2022-01-17 NOTE — Progress Notes (Signed)
Cardiology Office Note   Date:  01/17/2022   ID:  Denise [name], DOB 1950/07/31, MRN [id number]  PCP:  Minette Brine, FNP  Cardiologist:   Skeet Latch, MD   No chief complaint on file.     History of Present Illness: Denise [name] is a 71 y.o. female with Parkinson's disease, TIA, and prediabetes who is being seen today for the evaluation of bradycardia at the request of Minette Brine, [place].  She saw Ms. Moore on 11/2021.  EKG at that time revealed a heart rate of 50 bpm.  She noted that she had some intermittent bradycardia.  She has had intermittent episodes of gait imbalance.  Therefore she was referred to cardiology.  Today, she is accompanied with her daughter, and overall has been feeling well. 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 She denies any lightheadedness or dizziness. She exercises regularly with her Parkinson's therapy exercises.  She denies any exertional chest pain or shortness of breath.  She does sometimes get swelling in her feet if she rides for long distances.  She denies orthopnea or PND.  She reports symptoms of Raynaud's with her hands getting white or blue if she goes outside.  Sometimes it happens even if she is not cold.  She wears mittens even when inside, which seems to help.  She has been under a lot of stress with her daughters 3 recent surgeries and having to retire from her job due to [employer].  The patient denies chest pain, shortness of breath, nocturnal dyspnea, or orthopnea.  There have been no palpitations or syncope.   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     Calcium Carb-Cholecalciferol (OYSTER SHELL CALCIUM/VITAMIN D) 500-200 MG-UNIT PACK Take by mouth.     carbidopa-levodopa (SINEMET IR) 25-100 MG tablet Take at 6am,12 and 6pm. Try to avoid protein. May take an extra during the day if needed. 360 tablet 4   hydrochlorothiazide (HYDRODIURIL) 12.5 MG tablet TAKE 1 TABLET BY MOUTH DAILY AS NEEDED FOR LEG SWELLING 90 tablet 1   Travoprost, BAK Free, (TRAVATAN) 0.004 % SOLN ophthalmic solution 1 drop at bedtime.     Vitamin D, Ergocalciferol, (DRISDOL) 1.25 MG (50000 UNIT) CAPS capsule TAKE 1 CAPSULE (50,000 UNITS TOTAL) BY MOUTH EVERY 7 (SEVEN) DAYS 12 capsule 0   No current facility-administered medications for this visit.    Allergies:   Nystatin-triamcinolone, Triamcinolone acetonide, and Cephalexin    Social History:  The patient  reports that she has never smoked. She has never used smokeless tobacco. She reports that she does not drink alcohol and does not use drugs.   Family History:  The patient's family history includes Colitis in  her daughter; Diabetes in her father and sister; Heart disease in her mother and sister; Hypertension in her father and mother; Other in her father; Tuberculosis in her maternal grandmother.    ROS:   Please see the history of present illness. (+) LE Edema (+) Cyanosis in hands (+) Stress All other systems are reviewed and negative.     PHYSICAL EXAM: VS:  BP 108/66 (BP Location: Right Arm, Patient Position: Sitting, Cuff Size: Normal)   Pulse (!) 58   Ht '5\' 3"'$  (1.6 m)   Wt 126 lb 9.6 oz (57.4 kg)   BMI 22.43 kg/m  , BMI Body mass index is 22.43 kg/m. GENERAL:  Well appearing HEENT:  Pupils equal  round and reactive, fundi not visualized, oral mucosa unremarkable NECK:  No jugular venous distention, waveform within normal limits, carotid upstroke brisk and symmetric, no bruits, no thyromegaly LUNGS:  Clear to auscultation bilaterally HEART:  RRR.  PMI not displaced or sustained,S1 and S2 within normal limits, no S3, no S4, no clicks, no rubs, murmurs ABD:  Flat, positive bowel sounds normal in frequency in pitch, no bruits, no rebound, no guarding, no midline pulsatile mass, no hepatomegaly, no splenomegaly EXT:  2 plus pulses throughout, no edema, no cyanosis no clubbing SKIN:  No rashes no nodules NEURO:  Cranial nerves II through XII grossly intact, motor grossly intact throughout PSYCH:  Cognitively intact, oriented to person place and time    EKG:   01/17/2022 EKG: Rate 58. Sinus Bradycardia. Cannot rule out prior septal infacrt.   Recent Labs: 11/02/2021: ALT 7; BUN 15; Creatinine, Ser 1.05; Hemoglobin 13.3; Platelets 151; Potassium 3.9; Sodium 141    Lipid Panel    Component Value Date/Time   CHOL 155 11/02/2021 1721   TRIG 85 11/02/2021 1721   HDL 61 11/02/2021 1721   CHOLHDL 2.5 11/02/2021 1721   CHOLHDL 2.7 05/13/2019 0931   VLDL 13 04/05/2016 0922   LDLCALC 78 11/02/2021 1721   LDLCALC 84 05/13/2019 0931      Wt Readings from Last 3 Encounters:  01/17/22 126 lb 9.6 oz (57.4 kg)  11/02/21 130 lb 9.6 oz (59.2 kg)  09/21/21 129 lb (58.5 kg)      ASSESSMENT AND PLAN:  Bradycardia Ms. Denise [name] has heart rates in the 28s.  She his asymptomatic.  She has no symptoms of bradycardia.  She doesn't have symptoms of OSA.  We will check a TSH.  We did discuss that her timolol could be affecting her heart rate.  Given that she is asymptomatic, we will not ask her to stop her eyedrops.  If she does develop symptoms would be worth stopping this before considering a pacemaker.   Current medicines are reviewed at length with the patient today.  The patient does not have  concerns regarding medicines.  The following changes have been made:  no change  Labs/ tests ordered today include:   Orders Placed This Encounter  Procedures   TSH   EKG 12-Lead     Disposition:   FU with 1 year or PRN    I,Tinashe Williams,acting as a scribe for [place], MD.,have documented all relevant documentation on the behalf of Skeet Latch, MD,as directed by  Skeet Latch, MD while in the presence of Skeet Latch, MD.   I, [place], MD have reviewed all documentation for this visit.  The documentation of the exam, diagnosis, procedures, and orders on 01/17/2022 are all accurate and complete.

## 2022-01-17 NOTE — Progress Notes (Deleted)
Cardiology Office Note   Date:  01/17/2022   ID:  Denise [name], DOB 1951-04-06, MRN [id number]  PCP:  Minette Brine, FNP  Cardiologist:   Skeet Latch, MD   No chief complaint on file.     History of Present Illness: Denise [name] is a 71 y.o. female with Parkinson's disease, TIA, and prediabetes who is being seen today for the evaluation of bradycardia at the request of Minette Brine, [place].  She saw Ms. Moore on 11/2021.  EKG at that time revealed a heart rate of 50 bpm.  She noted that she had some intermittent bradycardia.  She has had intermittent episodes of gait imbalance.  Therefore she was referred to cardiology.     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     carbidopa-levodopa (SINEMET IR) 25-100 MG tablet Take at 6am,12 and 6pm. Try to avoid protein. May take an extra during the day if needed. 360 tablet 4   hydrochlorothiazide (HYDRODIURIL) 12.5 MG tablet TAKE 1 TABLET BY MOUTH DAILY AS NEEDED FOR LEG SWELLING 90 tablet 1   timolol (BETIMOL) 0.25 % ophthalmic solution 1-2 drops daily.      Travoprost, BAK Free, (TRAVATAN) 0.004 % SOLN ophthalmic solution 1 drop at bedtime.     Vitamin D, Ergocalciferol, (DRISDOL) 1.25 MG (50000 UNIT) CAPS capsule TAKE 1 CAPSULE (50,000 UNITS TOTAL) BY MOUTH EVERY 7 (SEVEN) DAYS 12 capsule  0   No current facility-administered medications for this visit.    Allergies:   Nystatin-triamcinolone, Triamcinolone acetonide, and Cephalexin    Social History:  The patient  reports that she has never smoked. She has never used smokeless tobacco. She reports that she does not drink alcohol and does not use drugs.   Family History:  The patient's ***family history includes Colitis in her daughter; Diabetes in her father and sister; Heart disease in her mother and sister; Hypertension in her father and mother; Other in her father; Tuberculosis in her maternal grandmother.    ROS:  Please see the history of present illness.   Otherwise, review of systems are positive for {NONE DEFAULTED:18576}.   All other systems are reviewed and negative.    PHYSICAL EXAM: VS:  There were no vitals taken for this visit. , BMI There is no height or weight on file to calculate BMI. GENERAL:  Well appearing HEENT:  Pupils equal round and reactive, fundi not visualized, oral mucosa unremarkable NECK:  No jugular venous distention, waveform within normal limits, carotid upstroke brisk and symmetric, no bruits, no thyromegaly LYMPHATICS:  No cervical adenopathy LUNGS:  Clear to auscultation bilaterally HEART:  RRR.  PMI not displaced or sustained,S1 and S2 within normal limits, no S3, no S4, no clicks, no rubs, ***  murmurs ABD:  Flat, positive bowel sounds normal in frequency in pitch, no bruits, no rebound, no guarding, no midline pulsatile mass, no hepatomegaly, no splenomegaly EXT:  2 plus pulses throughout, no edema, no cyanosis no clubbing SKIN:  No rashes no nodules NEURO:  Cranial nerves II through XII grossly intact, motor grossly intact throughout PSYCH:  Cognitively intact, oriented to person place and time    EKG:  EKG {ACTION; IS/IS CBJ:[id number]} ordered today. The ekg ordered today demonstrates ***   Recent Labs: 11/02/2021: ALT 7; BUN 15; Creatinine, Ser 1.05; Hemoglobin 13.3; Platelets  151; Potassium 3.9; Sodium 141    Lipid Panel    Component Value Date/Time   CHOL 155 11/02/2021 1721   TRIG 85 11/02/2021 1721   HDL 61 11/02/2021 1721   CHOLHDL 2.5 11/02/2021 1721   CHOLHDL 2.7 05/13/2019 0931   VLDL 13 04/05/2016 0922   LDLCALC 78 11/02/2021 1721   LDLCALC 84 05/13/2019 0931      Wt Readings from Last 3 Encounters:  11/02/21 130 lb 9.6 oz (59.2 kg)  09/21/21 129 lb (58.5 kg)  08/09/21 129 lb 6.4 oz (58.7 kg)      ASSESSMENT AND PLAN:  ***   Current medicines are reviewed at length with the patient today.  The patient {ACTIONS; HAS/DOES NOT HAVE:19233} concerns regarding medicines.  The following changes have been made:  {PLAN; NO CHANGE:13088:s}  Labs/ tests ordered today include: *** No orders of the defined types were placed in this encounter.    Disposition:   FU with ***     Signed, Yasheka Fossett C. Oval Linsey, MD, [facility]  01/17/2022 8:29 AM    [facility]

## 2022-01-17 NOTE — Patient Instructions (Signed)
Medication Instructions:  Your physician recommends that you continue on your current medications as directed. Please refer to the Current Medication list given to you today.  *If you need a refill on your cardiac medications before your next appointment, please call your pharmacy*  Lab Work: TSH TODAY  If you have labs (blood work) drawn today and your tests are completely normal, you will receive your results only by: [place] (if you have MyChart) OR A paper copy in the mail If you have any lab test that is abnormal or we need to change your treatment, we will call you to review the results.  Testing/Procedures: NONE  Follow-Up: At [facility], you and your health needs are our priority.  As part of our continuing mission to provide you with exceptional heart care, we have created designated Provider Care Teams.  These Care Teams include your primary Cardiologist (physician) and Advanced Practice Providers (APPs -  Physician Assistants and Nurse Practitioners) who all work together to provide you with the care you need, when you need it.  We recommend signing up for the patient portal called "MyChart".  Sign up information is provided on this After Visit Summary.  MyChart is used to connect with patients for Virtual Visits (Telemedicine).  Patients are able to view lab/test results, encounter notes, upcoming appointments, etc.  Non-urgent messages can be sent to your provider as well.   To learn more about what you can do with MyChart, go to [email or web address].    Your next appointment:   12 month(s)  The format for your next appointment:   In Person  Provider:   Skeet Latch, MD

## 2022-01-18 LAB — TSH: TSH: 0.669 u[IU]/mL (ref 0.450–4.500)

## 2022-02-07 ENCOUNTER — Ambulatory Visit: Payer: Medicare Other | Admitting: Nurse Practitioner

## 2022-03-10 ENCOUNTER — Ambulatory Visit: Payer: Medicare Other | Admitting: Nurse Practitioner

## 2022-03-15 ENCOUNTER — Encounter: Payer: Self-pay | Admitting: Nurse Practitioner

## 2022-03-15 ENCOUNTER — Ambulatory Visit (INDEPENDENT_AMBULATORY_CARE_PROVIDER_SITE_OTHER): Payer: Medicare Other | Admitting: Nurse Practitioner

## 2022-03-15 VITALS — BP 136/76 | Temp 98.1°F | Ht 63.0 in | Wt 125.0 lb

## 2022-03-15 DIAGNOSIS — D519 Vitamin B12 deficiency anemia, unspecified: Secondary | ICD-10-CM

## 2022-03-15 DIAGNOSIS — I2510 Atherosclerotic heart disease of native coronary artery without angina pectoris: Secondary | ICD-10-CM

## 2022-03-15 DIAGNOSIS — Z79899 Other long term (current) drug therapy: Secondary | ICD-10-CM

## 2022-03-15 DIAGNOSIS — Z23 Encounter for immunization: Secondary | ICD-10-CM

## 2022-03-15 DIAGNOSIS — E559 Vitamin D deficiency, unspecified: Secondary | ICD-10-CM

## 2022-03-15 DIAGNOSIS — R5383 Other fatigue: Secondary | ICD-10-CM | POA: Diagnosis not present

## 2022-03-15 DIAGNOSIS — G20 Parkinson's disease: Secondary | ICD-10-CM

## 2022-03-15 DIAGNOSIS — R7303 Prediabetes: Secondary | ICD-10-CM | POA: Diagnosis not present

## 2022-03-15 NOTE — Progress Notes (Signed)
I,Tianna Badgett,acting as a Education administrator for [employer], FNP.,have documented all relevant documentation on the behalf of Minette Brine, FNP,as directed by  Minette Brine, FNP while in the presence of Minette Brine, [place].  Subjective:     Patient ID: Denise [name] , female    DOB: March 09, 1951 , 71 y.o.   MRN: [id number]   Chief Complaint  Patient presents with   Prediabetes    HPI  Patient presents today for a prediabetes f/u.  She continues with vitamin B12 drops. If her vitamin b12 is low would like to have injections for a few weeks.   Wt Readings from Last 3 Encounters: 03/15/22 : 125 lb (56.7 kg) 01/17/22 : 126 lb 9.6 oz (57.4 kg) 11/02/21 : 130 lb 9.6 oz (59.2 kg)    Hyperlipidemia This is a chronic problem. The current episode started more than 1 year ago. The problem is controlled. Recent lipid tests were reviewed and are normal. She has no history of chronic renal disease. There are no known factors aggravating her hyperlipidemia. Pertinent negatives include no chest pain. She is currently on no antihyperlipidemic treatment. There are no compliance problems.  Risk factors for coronary artery disease include a sedentary lifestyle (she is in PT).     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     Family History  Problem Relation Age of Onset   Hypertension Mother    Heart disease Mother    Diabetes Father    Hypertension Father    Other Father        had a condition that was "a cousin" to ALS    Diabetes Sister    Tuberculosis Maternal Grandmother    Heart disease Sister    Colitis Daughter    Colon cancer Neg Hx    Colon polyps Neg Hx    Esophageal cancer Neg Hx    Rectal cancer Neg Hx    Stomach cancer Neg Hx      Current Outpatient Medications:    alendronate (FOSAMAX) 70 MG tablet, TAKE 1 TABLET (70 MG TOTAL) BY  MOUTH EVERY 7 DAYS WITH FULL GLASS WATER ON EMPTY STOMACH, Disp: 12 tablet, Rfl: 1   atorvastatin (LIPITOR) 10 MG tablet, TAKE 1 TABLET BY MOUTH EVERY DAY AT BEDTIME MONDAY THROUGH FRIDAY, Disp: 65 tablet, Rfl: 4   brimonidine-timolol (COMBIGAN) 0.2-0.5 % ophthalmic solution, Place 1 drop into the left eye every 12 (twelve) hours., Disp: , Rfl:    Calcium Carb-Cholecalciferol (OYSTER SHELL CALCIUM/VITAMIN D) 500-200 MG-UNIT PACK, Take by mouth., Disp: , Rfl:    carbidopa-levodopa (SINEMET IR) 25-100 MG tablet, Take at 6am,12 and 6pm. Try to avoid protein. May take an extra during the day if needed., Disp: 360 tablet, Rfl: 4   hydrochlorothiazide (HYDRODIURIL) 12.5 MG tablet, TAKE 1, Disp: 90 tablet, Rfl: 1   Travoprost, BAK Free, (TRAVATAN) 0.004 % SOLN ophthalmic solution, 1 drop at bedtime., Disp: , Rfl:    Vitamin D, Ergocalciferol, (DRISDOL) 1.25 MG (50000 UNIT) CAPS capsule, Take 1 capsule (50,000 Units total) by mouth every 7 (seven) days., Disp: 12 capsule, Rfl: 0   Allergies  Allergen Reactions   Nystatin-Triamcinolone Swelling    BLISTERS   Triamcinolone Acetonide     Blisters    Cephalexin Itching and Rash  Review of Systems  Constitutional:  Positive for fatigue.  Respiratory: Negative.    Cardiovascular: Negative.  Negative for chest pain, palpitations and leg swelling.  Gastrointestinal: Negative.   Neurological: Negative.   Psychiatric/Behavioral: Negative.       Today's Vitals   03/15/22 1223  BP: 136/76  Temp: 98.1 F (36.7 C)  TempSrc: Oral  Weight: 125 lb (56.7 kg)  Height: _0  (1.6 m)   Body mass index is 22.14 kg/m.   Objective:  Physical Exam Vitals reviewed.  Constitutional:      General: She is not in acute distress.    Appearance: Normal appearance.  Cardiovascular:     Rate and Rhythm: Normal rate and regular rhythm.     Pulses: Normal pulses.     Heart sounds: Normal heart sounds.  Pulmonary:     Effort: Pulmonary effort is normal. No  respiratory distress.     Breath sounds: Normal breath sounds. No wheezing.  Neurological:     General: No focal deficit present.     Mental Status: She is alert and oriented to person, place, and time.     Cranial Nerves: No cranial nerve deficit.     Motor: No weakness.  Psychiatric:        Mood and Affect: Mood normal.        Behavior: Behavior normal.        Thought Content: Thought content normal.        Judgment: Judgment normal.         Assessment And Plan:     1. Prediabetes Comments: Diet controlled, continue current regimen of healthy diet and exercising as tolerated.  - Hemoglobin A1c  2. Atherosclerotic cardiovascular disease Comments: Continue statin, tolerating well.  - Lipid panel  3. Need for influenza vaccination Influenza vaccine administered Encouraged to take Tylenol as needed for fever or muscle aches. - Flu Vaccine QUAD High Dose(Fluad)  4. Fatigue, unspecified type Comments: May be related to a low vitamin B12, will check levels. - Vitamin B12  5. Vitamin D deficiency Will check vitamin D level and supplement as needed.    Also encouraged to spend 15 minutes in the sun daily.  - BMP8+EGFR - VITAMIN D 25 Hydroxy (Vit-D Deficiency, Fractures)  6. Anemia due to vitamin B12 deficiency, unspecified B12 deficiency type Will check vitamin B12 pending results will need to do vitamin B12 injections.   7. Parkinson's disease ([place]) Comments: Continue current medications and physical therapy exercises provided. Overall doing well. Continue follow up with Neurology  8. Other long term (current) drug therapy - TSH   Patient was given opportunity to ask questions. Patient verbalized understanding of the plan and was able to repeat key elements of the plan. All questions were answered to their satisfaction.  Minette Brine, FNP   I, Minette Brine, FNP, have reviewed all documentation for this visit. The documentation on 03/15/22 for the exam, diagnosis,  procedures, and orders are all accurate and complete.   IF YOU HAVE BEEN REFERRED TO A SPECIALIST, IT MAY TAKE 1-2 WEEKS TO SCHEDULE/PROCESS THE REFERRAL. IF YOU HAVE NOT HEARD FROM US/SPECIALIST IN TWO WEEKS, PLEASE GIVE [place] A CALL AT [phone].   THE PATIENT IS ENCOURAGED TO PRACTICE SOCIAL DISTANCING DUE TO THE COVID-19 PANDEMIC.

## 2022-03-15 NOTE — Patient Instructions (Signed)
Preventing Type 2 Diabetes Mellitus Type 2 diabetes, also called type 2 diabetes mellitus, is a long-term (chronic) disease that affects sugar (glucose) levels in your blood. Normally, a hormone called insulin allows glucose to enter cells in your body. The cells use glucose for energy. With type 2 diabetes, you will have one or both of these problems: Your pancreas does not make enough insulin. Cells in your body do not respond properly to insulin that your body makes (insulin resistance). Insulin resistance or lack of insulin causes extra glucose to build up in the blood instead of going into cells. As a result, high blood glucose (hyperglycemia) develops. That can cause many complications. Being overweight or obese and having an inactive (sedentary) lifestyle can increase your risk for diabetes. Type 2 diabetes can be delayed or prevented by making certain nutrition and lifestyle changes. How can this condition affect me? If you do not take steps to prevent diabetes, your blood glucose levels may keep increasing over time. Too much glucose in your blood for a long time can damage your blood vessels, heart, kidneys, nerves, and eyes. Type 2 diabetes can lead to chronic health problems and complications, such as: Heart disease. Stroke. Blindness. Kidney disease. Depression. Poor circulation in your feet and legs. In severe cases, a foot or leg may need to be surgically removed (amputated). What can increase my risk? You may be more likely to develop type 2 diabetes if you: Have type 2 diabetes in your family. Are overweight or obese. Have a sedentary lifestyle. Have insulin resistance or a history of prediabetes. Have a history of pregnancy-related (gestational) diabetes or polycystic ovary syndrome (PCOS). What actions can I take to prevent this? It can be difficult to recognize signs of type 2 diabetes. Taking action to prevent the disease before you develop symptoms is the best way to avoid  possible damage to your body. Making certain nutrition and lifestyle changes may prevent or delay the disease and related health problems. Nutrition  Eat healthy meals and snacks regularly. Do not skip meals. Fruit or a handful of nuts is a healthy snack between meals. Drink water throughout the day. Avoid drinks that contain added sugar, such as soda or sweetened tea. Drink enough fluid to keep your urine pale yellow. Follow instructions from your health care provider about eating or drinking restrictions. Limit the amount of food you eat by: Managing how much you eat at a time (portion size). Checking food labels for the serving sizes of food. Using a kitchen scale to weigh amounts of food. Saut or steam food instead of frying it. Cook with water or broth instead of oils or butter. Limit saturated fat and salt (sodium) in your diet. Have no more than 1 tsp (2,400 mg) of sodium a day. If you have heart disease or high blood pressure, use less than ? tsp (1,500 mg) of sodium a day. Lifestyle  Lose weight if needed and as told. Your health care provider can determine how much weight loss is best for you and can help you lose weight safely. If you are overweight or obese, you may be told to lose at least 5?7% of your body weight. Manage blood pressure, cholesterol, and stress. Your health care provider will help determine the best treatment for you. Do not use any products that contain nicotine or tobacco. These products include cigarettes, chewing tobacco, and vaping devices, such as e-cigarettes. If you need help quitting, ask your health care provider. Activity  Do physical   activity that makes your heart beat faster and makes you sweat (moderate intensity). Do this for at least 30 minutes on at least 5 days of the week, or as much as told by your health care provider. Ask your health care provider what activities are safe for you. A mix of activities may be best, such as walking, swimming,  cycling, and strength training. Try to add physical activity into your day. For example: Park your car farther away than usual so that you walk more. Take a walk during your lunch break. Use stairs instead of elevators or escalators. Walk or bike to work instead of driving. Alcohol use If you drink alcohol: Limit how much you have to: 0?1 drink a day for women who are not pregnant. 0?2 drinks a day for men. Know how much alcohol is in your drink. In the [place] one drink equals one 12 oz bottle of beer (355 mL), one 5 oz glass of wine (148 mL), or one 1 oz glass of hard liquor (44 mL). General information Talk with your health care provider about your risk factors and how you can reduce your risk for diabetes. Have your blood glucose tested regularly, as told by your health care provider. Get screening tests as told by your health care provider. You may have these regularly, especially if you have certain risk factors for type 2 diabetes. Make an appointment with a registered dietitian. This diet and nutrition specialist can help you make a healthy eating plan and help you understand portion sizes and food labels. Where to find support Ask your health care provider to recommend a registered dietitian, a certified diabetes care and education specialist, or a weight loss program. Look for local or online weight loss groups. Join a gym, fitness club, or outdoor activity group, such as a walking club. Where to find more information For help and guidance and to learn more about diabetes and diabetes prevention, visit: American Diabetes Association (ADA): www.diabetes.org National Institute of Diabetes and Digestive and Kidney Diseases: www.niddk.nih.gov To learn more about healthy eating, visit: U.S. Department of Agriculture (USDA): www.choosemyplate.gov Office of Disease Prevention and Health Promotion (ODPHP): health.gov Summary You can delay or prevent type 2 diabetes by eating healthy  foods, losing weight if needed, and increasing your physical activity. Talk with your health care provider about your risk factors for type 2 diabetes and how you can reduce your risk. It can be difficult to recognize the signs of type 2 diabetes. The best way to avoid possible damage to your body is to take action to prevent the disease before you develop symptoms. Get screening tests as told by your health care provider. This information is not intended to replace advice given to you by your health care provider. Make sure you discuss any questions you have with your health care provider. Document Revised: 09/14/2020 Document Reviewed: 09/14/2020 Elsevier Patient Education  2023 Elsevier Inc.  

## 2022-03-16 LAB — BMP8+EGFR
BUN/Creatinine Ratio: 13 (ref 12–28)
BUN: 12 mg/dL (ref 8–27)
CO2: 24 mmol/L (ref 20–29)
Calcium: 9.1 mg/dL (ref 8.7–10.3)
Chloride: 105 mmol/L (ref 96–106)
Creatinine, Ser: 0.92 mg/dL (ref 0.57–1.00)
Glucose: 84 mg/dL (ref 70–99)
Potassium: 4 mmol/L (ref 3.5–5.2)
Sodium: 143 mmol/L (ref 134–144)
eGFR: 67 mL/min/{1.73_m2} (ref >59)

## 2022-03-16 LAB — LIPID PANEL
Chol/HDL Ratio: 2.1 ratio (ref 0.0–4.4)
Cholesterol, Total: 121 mg/dL (ref 100–199)
HDL: 57 mg/dL (ref >39)
LDL Chol Calc (NIH): 51 mg/dL (ref 0–99)
Triglycerides: 58 mg/dL (ref 0–149)
VLDL Cholesterol Cal: 13 mg/dL (ref 5–40)

## 2022-03-16 LAB — HEMOGLOBIN A1C
Est. average glucose Bld gHb Est-mCnc: 120 mg/dL
Hgb A1c MFr Bld: 5.8 % — ABNORMAL HIGH (ref 4.8–5.6)

## 2022-03-16 LAB — TSH: TSH: 0.723 u[IU]/mL (ref 0.450–4.500)

## 2022-03-16 LAB — VITAMIN B12: Vitamin B-12: >2000 pg/mL — ABNORMAL HIGH (ref 232–1245)

## 2022-03-16 LAB — VITAMIN D 25 HYDROXY (VIT D DEFICIENCY, FRACTURES): Vit D, 25-Hydroxy: 27.1 ng/mL — ABNORMAL LOW (ref 30.0–100.0)

## 2022-03-17 ENCOUNTER — Other Ambulatory Visit: Payer: Self-pay | Admitting: Nurse Practitioner

## 2022-03-17 ENCOUNTER — Other Ambulatory Visit: Payer: Self-pay

## 2022-03-17 ENCOUNTER — Encounter: Payer: Self-pay | Admitting: Nurse Practitioner

## 2022-03-17 DIAGNOSIS — R600 Localized edema: Secondary | ICD-10-CM

## 2022-03-17 MED ORDER — VITAMIN D (ERGOCALCIFEROL) 1.25 MG (50000 UNIT) PO CAPS
50000.0000 [IU] | ORAL_CAPSULE | ORAL | 0 refills | Status: DC
Start: 2022-03-17 — End: 2022-10-17

## 2022-03-17 MED ORDER — HYDROCHLOROTHIAZIDE 12.5 MG PO TABS: ORAL_TABLET | ORAL | 1 refills | Status: DC

## 2022-03-23 ENCOUNTER — Encounter: Payer: Self-pay | Admitting: Nurse Practitioner

## 2022-03-25 DIAGNOSIS — M19021 Primary osteoarthritis, right elbow: Secondary | ICD-10-CM | POA: Diagnosis not present

## 2022-03-25 DIAGNOSIS — S0081XA Abrasion of other part of head, initial encounter: Secondary | ICD-10-CM | POA: Diagnosis not present

## 2022-03-25 DIAGNOSIS — M25521 Pain in right elbow: Secondary | ICD-10-CM | POA: Diagnosis not present

## 2022-03-25 DIAGNOSIS — M1711 Unilateral primary osteoarthritis, right knee: Secondary | ICD-10-CM | POA: Diagnosis not present

## 2022-03-25 DIAGNOSIS — M25561 Pain in right knee: Secondary | ICD-10-CM | POA: Diagnosis not present

## 2022-03-26 ENCOUNTER — Encounter: Payer: Self-pay | Admitting: Nurse Practitioner

## 2022-03-28 ENCOUNTER — Telehealth: Payer: Self-pay

## 2022-03-28 NOTE — Telephone Encounter (Signed)
Transition Care Management Unsuccessful Follow-up Telephone Call  Date of discharge and from where:  03/24/2022   Attempts:  1st Attempt  Reason for unsuccessful TCM follow-up call:  Left voice message

## 2022-04-05 ENCOUNTER — Ambulatory Visit (INDEPENDENT_AMBULATORY_CARE_PROVIDER_SITE_OTHER): Payer: Medicare Other | Admitting: Nurse Practitioner

## 2022-04-05 ENCOUNTER — Encounter: Payer: Self-pay | Admitting: Nurse Practitioner

## 2022-04-05 VITALS — BP 120/60 | HR 72 | Temp 98.1°F | Ht 63.0 in | Wt 124.4 lb

## 2022-04-05 DIAGNOSIS — M25561 Pain in right knee: Secondary | ICD-10-CM

## 2022-04-05 DIAGNOSIS — R7303 Prediabetes: Secondary | ICD-10-CM | POA: Diagnosis not present

## 2022-04-05 DIAGNOSIS — M810 Age-related osteoporosis without current pathological fracture: Secondary | ICD-10-CM

## 2022-04-05 DIAGNOSIS — W19XXXD Unspecified fall, subsequent encounter: Secondary | ICD-10-CM | POA: Diagnosis not present

## 2022-04-05 NOTE — Patient Instructions (Signed)
Acute Knee Pain, Adult Acute knee pain is sudden and may be caused by damage, swelling, or irritation of the muscles and tissues that support the knee. Pain may result from: A fall. An injury to the knee from twisting motions. A hit to the knee. Infection. Acute knee pain may go away on its own with time and rest. If it does not, your health care provider may order tests to find the cause of the pain. These may include: Imaging tests, such as an X-ray, MRI, CT scan, or ultrasound. Joint aspiration. In this test, fluid is removed from the knee and evaluated. Arthroscopy. In this test, a lighted tube is inserted into the knee and an image is projected onto a TV screen. Biopsy. In this test, a sample of tissue is removed from the body and studied under a microscope. Follow these instructions at home: If you have a knee sleeve or brace:  Wear the knee sleeve or brace as told by your health care provider. Remove it only as told by your health care provider. Loosen it if your toes tingle, become numb, or turn cold and blue. Keep it clean. If the knee sleeve or brace is not waterproof: Do not let it get wet. Cover it with a watertight covering when you take a bath or shower. Activity Rest your knee. Do not do things that cause pain or make pain worse. Avoid high-impact activities or exercises, such as running, jumping rope, or doing jumping jacks. Work with a physical therapist to make a safe exercise program, as recommended by your health care provider. Do exercises as told by your physical therapist. Managing pain, stiffness, and swelling  If directed, put ice on the affected knee. To do this: If you have a removable knee sleeve or brace, remove it as told by your health care provider. Put ice in a plastic bag. Place a towel between your skin and the bag. Leave the ice on for 20 minutes, 2-3 times a day. Remove the ice if your skin turns bright red. This is very important. If you cannot  feel pain, heat, or cold, you have a greater risk of damage to the area. If directed, use an elastic bandage to put pressure (compression) on your injured knee. This may control swelling, give support, and help with discomfort. Raise (elevate) your knee above the level of your heart while you are sitting or lying down. Sleep with a pillow under your knee. General instructions Take over-the-counter and prescription medicines only as told by your health care provider. Do not use any products that contain nicotine or tobacco, such as cigarettes, e-cigarettes, and chewing tobacco. If you need help quitting, ask your health care provider. If you are overweight, work with your health care provider and a dietitian to set a weight-loss goal that is healthy and reasonable for you. Extra weight can put pressure on your knee. Pay attention to any changes in your symptoms. Keep all follow-up visits. This is important. Contact a health care provider if: Your knee pain continues, changes, or gets worse. You have a fever along with knee pain. Your knee feels warm to the touch or is red. Your knee buckles or locks up. Get help right away if: Your knee swells, and the swelling becomes worse. You cannot move your knee. You have severe pain in your knee that cannot be managed with pain medicine. Summary Acute knee pain can be caused by a fall, an injury, an infection, or damage, swelling, or irritation   of the tissues that support your knee. Your health care provider may perform tests to find out the cause of the pain. Pay attention to any changes in your symptoms. Relieve your pain with rest, medicines, light activity, and the use of ice. Get help right away if your knee swells, you cannot move your knee, or you have severe pain that cannot be managed with medicine. This information is not intended to replace advice given to you by your health care provider. Make sure you discuss any questions you have with  your health care provider. Document Revised: 12/04/2019 Document Reviewed: 12/04/2019 Elsevier Patient Education  2023 Elsevier Inc.  

## 2022-04-05 NOTE — Progress Notes (Signed)
I,Victoria T Hamilton,acting as a Education administrator for Minette Brine, FNP.,have documented all relevant documentation on the behalf of Minette Brine, FNP,as directed by  Minette Brine, FNP while in the presence of Minette Brine, [place].   Subjective:     Patient ID: Denise [name] , female    DOB: 09/14/1950 , 71 y.o.   MRN: [id number]   Chief Complaint  Patient presents with   Knee Pain    HPI  Pt presents today for knee trouble after falling forward and hitting her right knee, left eye had an abrasion. She has red upper lip.  Accompanied by her daughter today.   Daughter reports 2 weeks ago patient had a fall. On Sept 21st. Patient did have a xray done. No fractures or broken bones. Minimal osteoarthritis of the right knee with trace join effusion. She is currently taking ibprofen '800MG'$  as needed.  Using more heat than ice.   Daughter reports this is the first fall fall in 2 years.  She went to an Urgent Care in [place].   She feels like her pain is worse, difficulty with going up and down stairs. She has completed PT for another issue, will go back in December.      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     Family History  Problem Relation Age of Onset   Hypertension Mother    Heart disease Mother    Diabetes Father    Hypertension Father    Other Father        had a condition that was "a cousin" to ALS    Diabetes Sister    Tuberculosis Maternal Grandmother    Heart disease Sister    Colitis Daughter    Colon cancer Neg Hx    Colon polyps Neg Hx    Esophageal cancer Neg Hx    Rectal cancer Neg Hx    Stomach cancer Neg Hx      Current Outpatient Medications:    alendronate (FOSAMAX) 70 MG tablet, TAKE 1 TABLET (70 MG TOTAL) BY MOUTH EVERY 7 DAYS WITH FULL GLASS WATER ON EMPTY STOMACH, Disp: 12 tablet, Rfl: 1   atorvastatin  (LIPITOR) 10 MG tablet, TAKE 1 TABLET BY MOUTH EVERY DAY AT BEDTIME MONDAY THROUGH FRIDAY, Disp: 65 tablet, Rfl: 4   brimonidine-timolol (COMBIGAN) 0.2-0.5 % ophthalmic solution, Place 1 drop into the left eye every 12 (twelve) hours., Disp: , Rfl:    Calcium Carb-Cholecalciferol (OYSTER SHELL CALCIUM/VITAMIN D) 500-200 MG-UNIT PACK, Take by mouth., Disp: , Rfl:    carbidopa-levodopa (SINEMET IR) 25-100 MG tablet, Take at 6am,12 and 6pm. Try to avoid protein. May take an extra during the day if needed., Disp: 360 tablet, Rfl: 4   hydrochlorothiazide (HYDRODIURIL) 12.5 MG tablet, TAKE 1, Disp: 90 tablet, Rfl: 1   ibuprofen (ADVIL) 800 MG tablet, Take 800 mg by mouth every 8 (eight) hours as needed., Disp: , Rfl:    Travoprost, BAK Free, (TRAVATAN) 0.004 % SOLN ophthalmic solution, 1 drop at bedtime., Disp: , Rfl:    Vitamin D, Ergocalciferol, (DRISDOL) 1.25 MG (50000 UNIT) CAPS capsule, Take 1 capsule (50,000 Units total) by mouth every 7 (seven) days., Disp: 12 capsule, Rfl: 0   Allergies  Allergen Reactions   Nystatin-Triamcinolone Swelling  BLISTERS   Triamcinolone Acetonide     Blisters    Cephalexin Itching and Rash     Review of Systems  Constitutional: Negative.   Respiratory: Negative.    Cardiovascular: Negative.   Neurological: Negative.   Psychiatric/Behavioral: Negative.       Today's Vitals   04/05/22 1602  BP: 120/60  Pulse: 72  Temp: 98.1 F (36.7 C)  SpO2: 98%  Weight: 124 lb 6.4 oz (56.4 kg)  Height: '5\' 3"'$  (1.6 m)  PainSc: 6    Body mass index is 22.04 kg/m.   Objective:  Physical Exam Vitals reviewed.  Constitutional:      Appearance: Normal appearance.  Cardiovascular:     Rate and Rhythm: Normal rate and regular rhythm.     Pulses: Normal pulses.     Heart sounds: Normal heart sounds. No murmur heard. Neurological:     Mental Status: She is alert.         Assessment And Plan:     1. Acute pain of right knee Comments: Occurred after  suffering a fall, no abnormal findings on physical exam except for tenderness to medial patella. Will refer to [facility]. Reviewed report from urgent care, no fractures seen - Ambulatory referral to Orthopedic Surgery  2. Prediabetes Comments: Stable, continue current diet low in sugar and carbohydrates  3. Age-related osteoporosis without current pathological fracture Comments: Continue current medications and low impact exercise such as walking  4. Fall, subsequent encounter Comments: [place] while at church injured left above eye which has healed and right knee, still with mild pain and discomfort     Patient was given opportunity to ask questions. Patient verbalized understanding of the plan and was able to repeat key elements of the plan. All questions were answered to their satisfaction.  Minette Brine, FNP    I, Minette Brine, FNP, have reviewed all documentation for this visit. The documentation on 04/05/22 for the exam, diagnosis, procedures, and orders are all accurate and complete.  IF YOU HAVE BEEN REFERRED TO A SPECIALIST, IT MAY TAKE 1-2 WEEKS TO SCHEDULE/PROCESS THE REFERRAL. IF YOU HAVE NOT HEARD FROM US/SPECIALIST IN TWO WEEKS, PLEASE GIVE [place] A CALL AT [phone].   THE PATIENT IS ENCOURAGED TO PRACTICE SOCIAL DISTANCING DUE TO THE COVID-19 PANDEMIC.

## 2022-04-10 ENCOUNTER — Encounter: Payer: Self-pay | Admitting: Nurse Practitioner

## 2022-04-27 ENCOUNTER — Encounter: Payer: Self-pay | Admitting: Orthopaedic Surgery

## 2022-04-27 ENCOUNTER — Ambulatory Visit (INDEPENDENT_AMBULATORY_CARE_PROVIDER_SITE_OTHER): Payer: Medicare Other | Admitting: Orthopaedic Surgery

## 2022-04-27 ENCOUNTER — Other Ambulatory Visit: Payer: Self-pay | Admitting: Nurse Practitioner

## 2022-04-27 VITALS — BP 147/76 | HR 56 | Ht 63.0 in | Wt 124.0 lb

## 2022-04-27 DIAGNOSIS — G20A1 Parkinson's disease without dyskinesia, without mention of fluctuations: Secondary | ICD-10-CM

## 2022-04-27 DIAGNOSIS — M810 Age-related osteoporosis without current pathological fracture: Secondary | ICD-10-CM

## 2022-04-27 NOTE — Progress Notes (Signed)
Office Visit Note   Patient: Denise [name]           Date of Birth: [birth year]           MRN: [id number] Visit Date: 04/27/2022              Requested by: Minette Brine, [address] [place],  [place] [address] PCP: Minette Brine, FNP   Assessment & Plan: Visit Diagnoses:  1. Parkinson's disease without dyskinesia, unspecified whether manifestations fluctuate     Plan: Patient's had a single fall in the last year we discussed fall prevention using a cane.  We discussed problems with Parkinson's with sudden acceleration and usually falling forward.  She will use her cane she has no evidence of injury with her single fall and can return on an as-needed basis.  Follow-Up Instructions: Return if symptoms worsen or fail to improve.   Orders:  No orders of the defined types were placed in this encounter.  No orders of the defined types were placed in this encounter.     Procedures: No procedures performed   Clinical Data: No additional findings.   Subjective: Chief Complaint  Patient presents with   Right Knee - Pain    Fall 03/24/2022    HPI 71 year old female here with her daughter with a history of falling face first when she was going down the hall thinks she might of caught her toe on the carpet.  She has Parkinson's this is the first fall in the last year.  She was seen at [facility] x-rays of her elbow as well as her knee.  She had little bit of burning pain anterior to the knee but did not have any abrasion or significant ecchymosis.  She is used ibuprofen 800 mg but is not been taking in the last few days.  Review of Systems as for prediabetes, Parkinson's bradycardia and osteopenia.   Objective: Vital Signs: BP (!) 147/76   Pulse (!) 56   Ht '5\' 3"'$  (1.6 m)   Wt 124 lb (56.2 kg)   BMI 21.97 kg/m   Physical Exam Constitutional:      Appearance: She is well-developed.  HENT:     Head: Normocephalic.     Right Ear: External ear normal.      Left Ear: External ear normal. There is no impacted cerumen.  Eyes:     Pupils: Pupils are equal, round, and reactive to light.  Neck:     Thyroid: No thyromegaly.     Trachea: No tracheal deviation.  Cardiovascular:     Rate and Rhythm: Normal rate.  Pulmonary:     Effort: Pulmonary effort is normal.  Abdominal:     Palpations: Abdomen is soft.  Musculoskeletal:     Cervical back: No rigidity.  Skin:    General: Skin is warm and dry.  Neurological:     Mental Status: She is alert and oriented to person, place, and time.  Psychiatric:        Behavior: Behavior normal.     Ortho Exam no tenderness anteriorly over the right knee patellar tendon.  No prepatellar bursal swelling.  Collateral crucial ligament exam is normal negative logroll the hips elbow has full range of motion.  Specialty Comments:  No specialty comments available.  Imaging: No results found.   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   Abnormal auditory perception of both ears 05/10/2016   Osteopenia 08/23/2012   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    Family History  Problem Relation Age of Onset   Hypertension Mother    Heart disease Mother    Diabetes Father    Hypertension Father    Other Father        had a condition that was "a cousin" to ALS    Diabetes Sister    Tuberculosis Maternal Grandmother    Heart disease Sister    Colitis  Daughter    Colon cancer Neg Hx    Colon polyps Neg Hx    Esophageal cancer Neg Hx    Rectal cancer Neg Hx    Stomach cancer Neg Hx     Past Surgical History:  Procedure Laterality Date   TUBAL LIGATION     Social History   Occupational History   Not on file  Tobacco Use   Smoking status: Never   Smokeless tobacco: Never  Vaping Use   Vaping Use: Never used  Substance and Sexual Activity   Alcohol use: Never   Drug use: No   Sexual activity: Not Currently    Birth control/protection: Post-menopausal

## 2022-05-02 ENCOUNTER — Encounter (INDEPENDENT_AMBULATORY_CARE_PROVIDER_SITE_OTHER): Payer: Self-pay

## 2022-06-03 DIAGNOSIS — Z79899 Other long term (current) drug therapy: Secondary | ICD-10-CM | POA: Diagnosis not present

## 2022-06-03 DIAGNOSIS — G20A1 Parkinson's disease without dyskinesia, without mention of fluctuations: Secondary | ICD-10-CM | POA: Diagnosis not present

## 2022-06-22 ENCOUNTER — Ambulatory Visit: Payer: Medicare Other | Admitting: Occupational Therapy

## 2022-06-22 ENCOUNTER — Ambulatory Visit: Payer: Medicare Other | Admitting: Physical Therapy

## 2022-06-22 ENCOUNTER — Ambulatory Visit: Payer: Medicare Other

## 2022-06-23 DIAGNOSIS — Z1231 Encounter for screening mammogram for malignant neoplasm of breast: Secondary | ICD-10-CM | POA: Diagnosis not present

## 2022-06-30 ENCOUNTER — Telehealth: Payer: Self-pay | Admitting: Physical Therapy

## 2022-06-30 NOTE — Telephone Encounter (Signed)
Denise [name],  Denise [name] is scheduled for  OT, PT, and ST re-evaluations on 07/13/22 as recommended when pt was last discharged from therapy due to progressive nature of diagnosis.    Pt was in agreement with this plan.  If you are in agreement, please send updated order for OT, PT, and ST via epic.  I have already tried reaching out to Denise [name] neurologist's office, but they have been unable to send one.   Thank you, [name], PT, DPT 06/30/22 2:47 PM     [facility] [address] [place], [place]  [address] Phone:  [phone] Fax:  [phone]

## 2022-07-13 ENCOUNTER — Ambulatory Visit: Payer: Medicare Other | Attending: Nurse Practitioner | Admitting: Physical Therapy

## 2022-07-13 ENCOUNTER — Encounter: Payer: Self-pay | Admitting: Physical Therapy

## 2022-07-13 ENCOUNTER — Ambulatory Visit: Payer: Medicare Other

## 2022-07-13 DIAGNOSIS — R2689 Other abnormalities of gait and mobility: Secondary | ICD-10-CM | POA: Diagnosis not present

## 2022-07-13 DIAGNOSIS — M6281 Muscle weakness (generalized): Secondary | ICD-10-CM | POA: Insufficient documentation

## 2022-07-13 DIAGNOSIS — R29818 Other symptoms and signs involving the nervous system: Secondary | ICD-10-CM

## 2022-07-13 DIAGNOSIS — R471 Dysarthria and anarthria: Secondary | ICD-10-CM

## 2022-07-13 DIAGNOSIS — R293 Abnormal posture: Secondary | ICD-10-CM

## 2022-07-13 DIAGNOSIS — R41841 Cognitive communication deficit: Secondary | ICD-10-CM | POA: Diagnosis not present

## 2022-07-13 DIAGNOSIS — R278 Other lack of coordination: Secondary | ICD-10-CM | POA: Insufficient documentation

## 2022-07-13 DIAGNOSIS — R2681 Unsteadiness on feet: Secondary | ICD-10-CM

## 2022-07-13 NOTE — Therapy (Signed)
OUTPATIENT PHYSICAL THERAPY NEURO EVALUATION   Patient Name: Denise [name] MRN: [id number] DOB:1951-05-12, 72 y.o., female Today's Date: 07/14/2022   PCP: Minette Brine, FNP  REFERRING PROVIDER: Buck Mam Audrea Muscat, MD    END OF SESSION:  PT End of Session - 07/14/22 0920     Visit Number 1    Number of Visits 17    Date for PT Re-Evaluation 09/12/22    Authorization Type BCBS    PT Start Time 1448    PT Stop Time 1530    PT Time Calculation (min) 42 min    Equipment Utilized During Treatment Gait belt    Activity Tolerance Patient tolerated treatment well    Behavior During Therapy WFL for tasks assessed/performed             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   Past Surgical History:  Procedure Laterality Date   TUBAL LIGATION     Patient Active Problem List   Diagnosis Date Noted   Bradycardia 01/17/2022   Parkinson's disease 02/17/2020   Gait abnormality 12/04/2019   Prediabetes 12/03/2018   Vitamin D deficiency 12/03/2018   Anemia due to vitamin B12 deficiency 12/03/2018   Fatigue 12/03/2018   Urinary frequency 10/01/2018   Vitamin B12 deficiency 10/01/2018   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   Abnormal auditory perception of both ears 05/10/2016   Osteopenia 08/23/2012    ONSET DATE:  06/30/2022  REFERRING DIAG: G20.A1 (ICD-10-CM) - Parkinsons   THERAPY DIAG:  Abnormal posture  Muscle weakness (generalized)  Other symptoms and signs involving the nervous system  Unsteadiness on feet  Other abnormalities of gait and mobility  Other lack of coordination  Rationale for Evaluation and Treatment: Rehabilitation  SUBJECTIVE:                                                                                                                                                                                              SUBJECTIVE STATEMENT: Has had 2 falls. Not sure what happened when she fell. Didn't think she picked her feet up enough and started going fast and steps got short. Went to Urgent Care to get x-rays and everything was fine. This happened in the hallway at [facility]. The 2nd fall happened outside at [place] [place] and there was a little ledge she couldn't get her foot over. One fall was  in September and the 2nd fall was in [place]. Had to have help to get off the floor. Feels like the balance is getting worse and has a fear of falling. When she rushes the steps gets smaller and faster. Has had some freezing of gait. Reports neurologist increased her Carbidopa Levodopa and is feeling better. Doing exercises from last bout of therapy and YouTube videos for balance. Has started using a cane in the past week. Pt reports difficulty getting up from softer and lower surfaces. Pt's daughter also reports that she doesn't back up all the way to the chair when going to sit down or doesn't turn all the way.   Pt accompanied by: family member daughter and granddaughter  PERTINENT HISTORY: PD, glaucoma, osteoporosis (newly diagnosed), TB (as a child)    PAIN:  Are you having pain? No  PRECAUTIONS: Fall and Other: Osteoporosis  WEIGHT BEARING RESTRICTIONS: No  FALLS: Has patient fallen in last 6 months? Yes. Number of falls 2  LIVING ENVIRONMENT: Lives with: lives with their family - daughter and granddaughter  Lives in: House/apartment Stairs: Yes: External: 2 steps; none Has following equipment at home: Single point cane and Grab bars  PLOF: Independent, pt's daughter reports sometimes pt needs help with dressing   PATIENT GOALS: See how she was since the last time she was here. Work on the balance   OBJECTIVE:    COGNITION: Overall cognitive status: Impaired   SENSATION: WFL  Pt reporting maybe having tingling in her feet    COORDINATION: Heel to shin: bradykinentic   POSTURE: rounded shoulders, forward head, trunk flexed, posterior pelvic tilt, and weight shift left Trunk lean and L lateral head tilt to L in sitting/standing.    LOWER EXTREMITY MMT:    MMT Right Eval Left Eval  Hip flexion 4 4+  Hip extension    Hip abduction 4- 4-  Hip adduction 5 5  Hip internal rotation    Hip external rotation    Knee flexion 5 4+  Knee extension 4 4  Ankle dorsiflexion 4+ 5  Ankle plantarflexion    Ankle inversion    Ankle eversion    (Blank rows = not tested)  All tested in sitting  BED MOBILITY:  Pt reporting no difficulties getting in and out of the bed.   TRANSFERS: Assistive device utilized: None  Sit to stand: SBA Stand to sit: SBA Performs with no UE support. During eval, pt demonstrating good technique with scooting out towards edge, wider BOS, and incr forward lean.   When going to turn to sit down in chair, pt does not turn all the way and does not have BLE against chair before going to sit. Pt's daughter also notices this at home.    GAIT: Gait pattern: step through pattern, decreased arm swing- Right, decreased arm swing- Left, decreased step length- Right, decreased step length- Left, decreased stride length, decreased ankle dorsiflexion- Right, decreased ankle dorsiflexion- Left, Right foot flat, Left foot flat, shuffling, lateral lean- Left, decreased trunk rotation, and trunk flexed Distance walked: Clinic distances  Assistive device utilized: Single point cane and None Level of assistance: SBA Comments: Assessed without AD during session. Pt brings in [facility] that she started using approx. A week ago due to recent falls. Pt unable to sequence it correctly and takes small steps. Educated at end of session proper sequencing with putting cane more out to the side and stepping LUE at the same time as cane in RUE. Will need more  practice during next session and to determine appropriate AD.    Pt slow with turns   FUNCTIONAL TESTS:  5 times sit to stand: 15.9 seconds with no UE support  10 meter walk test: 14.63 seconds with no AD = 2.24 ft/sec      [facility] PT Assessment - 07/13/22 1527       Standardized Balance Assessment   Standardized Balance Assessment Mini-BESTest;Timed Up and Go Test      Mini-BESTest   Sit To Stand Normal: Comes to stand without use of hands and stabilizes independently.    Rise to Toes < 3 s.    Stand on one leg (left) Moderate: < 20 s   3-4 seconds   Stand on one leg (right) Moderate: < 20 s   2-3 seconds   Stand on one leg - lowest score 1    Compensatory Stepping Correction - Forward Normal: Recovers independently with a single, large step (second realignement is allowed).    Compensatory Stepping Correction - Backward Moderate: More than one step is required to recover equilibrium   one small delayed step   Compensatory Stepping Correction - Left Lateral Moderate: Several steps to recover equilibrium   2-3 steps   Compensatory Stepping Correction - Right Lateral Severe:  Falls, or cannot step   unable to ellicit a step   Stepping Corredtion Lateral - lowest score 0    Timed UP & GO with Dual Task Moderate: Dual Task affects either counting OR walking (>10%) when compared to the TUG without Dual Task.      Timed Up and Go Test   Normal TUG (seconds) 12.59    Manual TUG (seconds) 16.63    Cognitive TUG (seconds) 14.72    TUG Comments Performed with no AD            Will finish miniBEST at next session.   TODAY'S TREATMENT:                                                                                                                              N/A during eval    PATIENT EDUCATION: Education details: Clinical findings, POC  Person educated: Patient and daughter, granddaughter  Education method: Explanation, Demonstration, and Verbal cues Education comprehension: verbalized understanding, returned demonstration, and needs further  education  HOME EXERCISE PROGRAM: From previous bout of therapy: Will review/updated as needed.   Standing and seated PWR, Pt has in her exercise folder.     Access Code: [email or web address] URL: [email or web address] Date: 10/19/2021 Prepared by: [name]   Reviewed/condensed exercises for balance:    Exercises - Alternating Step Backward with Support  - 1 x daily - 5 x weekly - 1-2 sets - 10 reps - Romberg Stance Eyes Closed on Foam Pad  - 1 x daily - 5 x weekly - 3 sets - 30 hold   Plus standing tall against the wall for posture - 2 x 30 second holds  GOALS: Goals reviewed with patient? Yes  SHORT TERM GOALS: ALL STGS = LTGS   LONG TERM GOALS: Target date: 08/11/2022  Pt will be independent with final PD HEP for improved strength, balance, transfers, and gait  Baseline:  Goal status: INITIAL  2.  miniBEST to be assessed with LTG written.  Baseline:  Goal status: INITIAL  3.  Pt will improve gait speed with no AD vs. LRAD to at least 2.7 ft/sec in order to demo improved community mobility.   Baseline: 14.63 seconds with no AD = 2.24 ft/sec  Goal status: INITIAL  4.  Pt will improve manual TUG time to 14.5 seconds or less in order to demo decrease fall risk Baseline: 16.63 seconds  Goal status: INITIAL  5.  Pt will improve 5x sit<>stand to less than or equal to 14 sec to demonstrate improved functional strength and transfer efficiency. Baseline: 15.9 seconds with no UE support  Goal status: INITIAL  6.  Pt will verbalize and demonstrate understanding of fall prevention in the home/community (use of appropriate AD, backing up into chairs, freezing strategies) Baseline:  Goal status: INITIAL  ASSESSMENT:  CLINICAL IMPRESSION: Patient is a 72 year old female referred to Neuro OPPT for PD (return 6 month eval). Since pt was here, pt has had 2 falls. Pt unsure of what happened, but thinks they were because she did not pick up her feet enough. The following  deficits were present during the exam: decr strength, decr safety awareness, impaired timing/coordination with gait, abnormalities of gait, abnormal posture, balance impairments, bradykinesia, and difficulties dual tasking with gait. Since pt's last bout of therapy in June of 2023, pt has had a slowing in her outcome measures. Based on manual/cog TUG, 5x sit <> stand, pt is an incr risk for falls. Unable to finish miniBEST today due to time constraints. Pt's gait speed with no AD indicates a limited community ambulator. Pt would benefit from skilled PT to address these impairments and functional limitations to maximize functional mobility independence and decr fall risk.    OBJECTIVE IMPAIRMENTS: Abnormal gait, decreased activity tolerance, decreased balance, decreased cognition, decreased coordination, decreased knowledge of use of DME, decreased mobility, difficulty walking, decreased strength, decreased safety awareness, impaired flexibility, impaired tone, and postural dysfunction.   ACTIVITY LIMITATIONS: carrying, lifting, bending, stairs, transfers, dressing, and locomotion level  PARTICIPATION LIMITATIONS: meal prep, cleaning, driving, shopping, and community activity  PERSONAL FACTORS: Age, Behavior pattern, Past/current experiences, Time since onset of injury/illness/exacerbation, and 3+ comorbidities: PD, glaucoma, osteoporosis (newly diagnosed), TB (as a child)    are also affecting patient's functional outcome.   REHAB POTENTIAL: Good  CLINICAL DECISION MAKING: Evolving/moderate complexity  EVALUATION COMPLEXITY: Moderate  PLAN:  PT FREQUENCY: 2x/week  PT DURATION: 8 weeks (anticipate maybe needing more than 4 weeks when pt initially got scheduled)  PLANNED INTERVENTIONS: Therapeutic exercises, Therapeutic activity, Neuromuscular re-education, Balance training, Gait training, Patient/Family education, Self Care, Stair training, Vestibular training, Visual/preceptual  remediation/compensation, DME instructions, Manual therapy, and Re-evaluation  PLAN FOR NEXT SESSION: Work on gait with cane and see if that is safe for pt or trial rollator for most appropriate AD due to recent falls. Finish miniBEST and update goal. Review HEP and update as needed. Work on turning all the way before sitting down to a chair/getting up from low surfaces. Posture awareness as pt with heavy lean to L.   Arliss Journey, PT, DPT  07/14/2022, 9:21 AM

## 2022-07-13 NOTE — Therapy (Signed)
OUTPATIENT SPEECH LANGUAGE PATHOLOGY PARKINSON'S EVALUATION   Patient Name: Denise [name] MRN: [id number] DOB:12/04/1950, 72 y.o., female Today's Date: 07/13/2022  PCP: Minette Brine FNP REFERRING PROVIDER: Thenganatt   END OF SESSION:  End of Session - 07/13/22 1513     Visit Number 1    Number of Visits 17    Date for SLP Re-Evaluation 09/07/22    Authorization Type [employer]    SLP Start Time 1324   restroom   SLP Stop Time  4010    SLP Time Calculation (min) 40 min    Activity Tolerance Patient tolerated treatment well             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   Past Surgical History:  Procedure Laterality Date   TUBAL LIGATION     Patient Active Problem List   Diagnosis Date Noted   Bradycardia 01/17/2022   Parkinson's disease 02/17/2020   Gait abnormality 12/04/2019   Prediabetes 12/03/2018   Vitamin D deficiency 12/03/2018   Anemia due to vitamin B12 deficiency 12/03/2018   Fatigue 12/03/2018   Urinary frequency 10/01/2018   Vitamin B12 deficiency 10/01/2018   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 No  FALLS: Has patient fallen in last 6 months?  Yes, Number of falls: 2, See PT evaluation for details  LIVING ENVIRONMENT: Lives with: lives with their  family Lives in: House/apartment  PLOF:  Level of assistance: Independent with ADLs, Independent with IADLs Employment: Retired  PATIENT GOALS: improve confidence while speaking  OBJECTIVE:   COGNITION: Overall cognitive status: Impaired Areas of impairment: Memory Comments: Some minimal but frustrating short term recall deficits endorsed, including forgetting necessary items (cane, cushion) and forgetting steps in typical routine (washing hands, flushing toilet)  MOTOR SPEECH: Overall motor speech: impaired Level of impairment: Conversation Respiration: thoracic breathing and clavicular breathing Phonation: low vocal intensity Resonance: WFL Articulation: Appears intact Intelligibility: Intelligible Motor planning: Appears intact Motor speech errors: inconsistent Interfering components:  PD Effective technique: increased vocal intensity and over articulate  ORAL MOTOR EXAMINATION: Overall status: WFL  OBJECTIVE VOICE ASSESSMENT: Sustained "ah" maximum phonation time: 19 seconds Sustained "ah" loudness average: 81 dB Oral reading (passage) loudness average: 73 dB Oral reading loudness range: 69-76 dB Conversational loudness average: 65 dB Conversational loudness range: 61-72 dB Voice quality: low vocal intensity Stimulability trials: Given SLP modeling and occasional min cues, loudness average increased to low 70s dB (range of 68 to 72) at (loud "ah", word, sentence, paragraph, conversation) level.   Completed audio recording of patients baseline voice without cueing from SLP: No  Pt does not report difficulty with swallowing which does not warrant further evaluation.  PATIENT REPORTED OUTCOME MEASURES (PROM): Not completed due to time constraints  TODAY'S TREATMENT:  07-13-22: Initiated education and instruction of memory  compensations to aid daily functioning at home (sign with needed items by door). Pt and daughter verbalized understanding and agreement with SLP recommendations and POC.   PATIENT EDUCATION: Education details: see above Person educated: Patient and Child(ren) Education method: Customer service manager Education comprehension: verbalized understanding, returned demonstration, and needs further education  HOME EXERCISE PROGRAM: Speak Out! Program    GOALS: Goals reviewed with patient? Yes  SHORT TERM GOALS: Target date: 08/10/2022  Pt will achieve targeted dB levels in demonstration of Speak Out! Lessons with 90% accuracy given rare min A over 2 sessions  Baseline: Goal status: INITIAL  2.  Pt will utilize dysarthria compensations in 5-10 minute conversation and average WNL conversational volume (70-72 dB) given occasional min A over 2 sessions   Baseline:  Goal status: INITIAL  3.  Pt and family will carryover memory compensations to aid daily functioning at home (recalling needed items/daily tasks) given occasional min A over 2 sessions  Baseline:  Goal status: INITIAL  4.  Pt will carryover dysarthria strategies in public speaking role play to be 100% intelligible given occasional min A Baseline:  Goal status: INITIAL  LONG TERM GOALS: Target date: 09/07/2022  Pt will utilize dysarthria compensations in 15+ minute conversation to optimize vocal intensity and clarity given rare min A over 2 sessions Baseline:  Goal status: INITIAL  2.  Pt and family will carryover memory compensations to aid daily functioning at home (recalling needed items/daily tasks) with mod I  Baseline:  Goal status: INITIAL  3.  Pt will subjectively report increased confidence and attend increased number of events per family baseline  Baseline:  Goal status: INITIAL   ASSESSMENT:  CLINICAL IMPRESSION: Patient is a 72 y.o. Female who was seen today for progression of Parkinson's Disease.  Today, pt presents with mild hypokinetic dysarthria with initial conversational volume averaging mid 60s dB. Following re-education of principle of intent and sustained phonation to re-calibrate sensory system, pt able to maintain low 70s dB in conversation with occasional min A. Reduced confidence speaking reported by her daughter resulting in patient avoiding typical social situations. Some subtle but frustrating short term recall changes endorsed related to forgetting items and daily tasks. Pt would benefit from skilled ST intervention to optimize confidence and communication effectiveness as well as maximize cognitive functioning with cognitive compensation training.   OBJECTIVE IMPAIRMENTS: Objective impairments include memory and dysarthria. These impairments are limiting patient from household responsibilities and effectively communicating at home and in community.Factors affecting potential to achieve goals and functional outcome are medical prognosis.. Patient will benefit from skilled SLP services to address above impairments and improve overall function.  REHAB POTENTIAL: Good  PLAN:  SLP FREQUENCY: 2x/week  SLP DURATION: 8 weeks  PLANNED INTERVENTIONS: Language facilitation, Environmental controls, Cueing hierachy, Cognitive reorganization, Internal/external aids, Functional tasks, Multimodal communication approach, SLP instruction and feedback, Compensatory strategies, and Patient/family education    Marzetta Board, CCC-SLP 07/13/2022, 3:13 PM

## 2022-07-14 DIAGNOSIS — H524 Presbyopia: Secondary | ICD-10-CM | POA: Diagnosis not present

## 2022-07-14 DIAGNOSIS — H401112 Primary open-angle glaucoma, right eye, moderate stage: Secondary | ICD-10-CM | POA: Diagnosis not present

## 2022-07-14 DIAGNOSIS — H47232 Glaucomatous optic atrophy, left eye: Secondary | ICD-10-CM | POA: Diagnosis not present

## 2022-07-14 DIAGNOSIS — H401123 Primary open-angle glaucoma, left eye, severe stage: Secondary | ICD-10-CM | POA: Diagnosis not present

## 2022-07-14 DIAGNOSIS — H04123 Dry eye syndrome of bilateral lacrimal glands: Secondary | ICD-10-CM | POA: Diagnosis not present

## 2022-07-19 ENCOUNTER — Encounter: Payer: Self-pay | Admitting: Physical Therapy

## 2022-07-19 ENCOUNTER — Ambulatory Visit: Payer: Medicare Other | Admitting: Physical Therapy

## 2022-07-19 ENCOUNTER — Ambulatory Visit: Payer: Medicare Other

## 2022-07-19 DIAGNOSIS — R2689 Other abnormalities of gait and mobility: Secondary | ICD-10-CM

## 2022-07-19 DIAGNOSIS — R2681 Unsteadiness on feet: Secondary | ICD-10-CM | POA: Diagnosis not present

## 2022-07-19 DIAGNOSIS — R29818 Other symptoms and signs involving the nervous system: Secondary | ICD-10-CM | POA: Diagnosis not present

## 2022-07-19 DIAGNOSIS — R41841 Cognitive communication deficit: Secondary | ICD-10-CM | POA: Diagnosis not present

## 2022-07-19 DIAGNOSIS — M6281 Muscle weakness (generalized): Secondary | ICD-10-CM

## 2022-07-19 DIAGNOSIS — R278 Other lack of coordination: Secondary | ICD-10-CM | POA: Diagnosis not present

## 2022-07-19 DIAGNOSIS — R471 Dysarthria and anarthria: Secondary | ICD-10-CM | POA: Diagnosis not present

## 2022-07-19 DIAGNOSIS — R293 Abnormal posture: Secondary | ICD-10-CM | POA: Diagnosis not present

## 2022-07-19 NOTE — Patient Instructions (Signed)
  Memory Compensation Strategies  Use "WARM" strategy. W= write it down A=  associate it R=  repeat it M=  make a mental picture  You can keep a Memory Notebook. Use a 3-ring notebook with sections for the following:  calendar, important names and phone numbers, medications, doctors' names/phone numbers, "to do list"/reminders, and a section to journal what you did each day  Use a calendar to write appointments down.  Write yourself a schedule for the day.  This can be placed on the calendar or in a separate section of the Memory Notebook.  Keeping a regular schedule can help memory.  Use medication organizer with sections for each day or morning/evening pills  You may need help loading it  Keep a basket, or pegboard by the door.   Place items that you need to take out with you in the basket or on the pegboard.  You may also want to include a message board for reminders.  Use sticky notes. Place sticky notes with reminders in a place where the task is performed.  For example:  "turn off the stove" placed by the stove, "lock the door" placed on the door at eye level, "take your medications" on the bathroom mirror or by the place where you normally take your medications  Use alarms, timers, and/or a reminder app. Use while cooking to remind yourself to check on food or as a reminder to take your medicine, or as a reminder to make a call, or as a reminder to perform another task, etc.   Homework: bring in questions for your upcoming panel so we can practice in therapy

## 2022-07-19 NOTE — Therapy (Signed)
OUTPATIENT SPEECH LANGUAGE PATHOLOGY PARKINSON'S TREATMENT   Patient Name: Denise [name] MRN: [id number] DOB:[birth year], 72 y.o., female Today's Date: 07/19/2022  PCP: Minette Brine FNP REFERRING PROVIDER: Thenganatt   END OF SESSION:  End of Session - 07/19/22 1230     Visit Number 2    Number of Visits 17    Date for SLP Re-Evaluation 09/07/22    Authorization Type [employer]    SLP Start Time 1315    SLP Stop Time  1400    SLP Time Calculation (min) 45 min    Activity Tolerance Patient tolerated treatment well              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   Past Surgical History:  Procedure Laterality Date   TUBAL LIGATION     Patient Active Problem List   Diagnosis Date Noted   Bradycardia 01/17/2022   Parkinson's disease 02/17/2020   Gait abnormality 12/04/2019   Prediabetes 12/03/2018   Vitamin D deficiency 12/03/2018   Anemia due to vitamin B12 deficiency 12/03/2018   Fatigue 12/03/2018   Urinary frequency 10/01/2018   Vitamin B12 deficiency 10/01/2018   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 No  PATIENT GOALS: improve confidence while speaking  OBJECTIVE:   TODAY'S TREATMENT:                                                                                                                                           07-19-22: Generated strategy and visual aid to aid recall of necessary items for departure. With structured repetitive practice, pt able to recall 5 needed items with mod I by end of session. Provided instructions and visual aid to implement at home. Pt continues with online Speak Out! Lessons daily. Conversational volume initially averaged mid 60s dB.Targeted improving vocal quality and increasing intensity through progressively difficulty speech tasks using Speak Out! program, lesson 1 review. ST leads pt through exercises providing occasional model prior to pt execution. occasional min-A required to achieve target dB this date. Averages this date: loud "ah" 86 dB; reading 77 dB; cognitive speech task 73 dB. Conversational sample of approx 5 minutes, pt averages low 70s dB with  occasional min-A.   07-13-22: Initiated education and instruction of memory compensations to aid daily functioning at home (sign with needed items by door). Pt and daughter verbalized understanding and agreement with SLP recommendations and POC.   PATIENT EDUCATION: Education details: see above Person educated: Patient and Child(ren) Education method: Customer service manager Education comprehension: verbalized understanding, returned demonstration, and needs further education  HOME EXERCISE PROGRAM: Speak Out! Program    GOALS: Goals reviewed with patient? Yes  SHORT TERM GOALS: Target date: 08/10/2022  Pt will achieve targeted dB levels in demonstration of Speak Out! Lessons with 90% accuracy given rare min A over 2 sessions  Baseline: Goal status: IN PROGRESS  2.  Pt will utilize dysarthria compensations in 5-10 minute conversation and average WNL conversational volume (70-72 dB) given occasional min A over 2 sessions   Baseline:  Goal status: IN PROGRESS  3.  Pt and family will carryover memory compensations to aid daily functioning at home (recalling needed  items/daily tasks) given occasional min A over 2 sessions  Baseline:  Goal status: IN PROGRESS  4.  Pt will carryover dysarthria strategies in public speaking role play to be 100% intelligible given occasional min A Baseline:  Goal status: IN PROGRESS  LONG TERM GOALS: Target date: 09/07/2022  Pt will utilize dysarthria compensations in 15+ minute conversation to optimize vocal intensity and clarity given rare min A over 2 sessions Baseline:  Goal status: IN PROGRESS  2.  Pt and family will carryover memory compensations to aid daily functioning at home (recalling needed items/daily tasks) with mod I  Baseline:  Goal status: IN PROGRESS  3.  Pt will subjectively report increased confidence and attend increased number of events per family baseline  Baseline:  Goal status: IN PROGRESS   ASSESSMENT:  CLINICAL IMPRESSION: Patient is a 72 y.o. Female who was seen today for progression of Parkinson's Disease. Today, pt presents with mild hypokinetic dysarthria with initial conversational volume averaging mid 60s dB. Following re-education of principle of intent and Speak Out! program, pt able to maintain low 70s dB in conversation with occasional min A. Reduced confidence speaking reported by her daughter resulting in patient avoiding typical social situations. Some subtle but frustrating short term recall changes endorsed related to forgetting items and daily tasks, with education and instruction of memory compensations initiated today. Pt would benefit from skilled ST intervention to optimize confidence and communication effectiveness as well as maximize cognitive functioning with cognitive compensation training.   OBJECTIVE IMPAIRMENTS: Objective impairments include memory and dysarthria. These impairments are limiting patient from household responsibilities and effectively communicating at home and in community.Factors affecting potential to achieve goals and functional outcome are medical  prognosis.. Patient will benefit from skilled SLP services to address above impairments and improve overall function.  REHAB POTENTIAL: Good  PLAN:  SLP FREQUENCY: 2x/week  SLP DURATION: 8 weeks  PLANNED INTERVENTIONS: Language facilitation, Environmental controls, Cueing hierachy, Cognitive reorganization, Internal/external aids, Functional tasks, Multimodal communication approach, SLP instruction and feedback, Compensatory strategies, and Patient/family education    Marzetta Board, CCC-SLP 07/19/2022, 12:30 PM

## 2022-07-19 NOTE — Therapy (Signed)
OUTPATIENT PHYSICAL THERAPY NEURO TREATMENT   Patient Name: Denise [name] MRN: [id number] DOB:[birth year], 72 y.o., female Today's Date: 07/19/2022   PCP: Minette Brine, FNP  REFERRING PROVIDER: Buck Mam Audrea Muscat, MD    END OF SESSION:  PT End of Session - 07/19/22 1235     Visit Number 2    Number of Visits 17    Date for PT Re-Evaluation 09/12/22    Authorization Type BCBS    PT Start Time 1233    PT Stop Time 1314    PT Time Calculation (min) 41 min    Equipment Utilized During Treatment Gait belt    Activity Tolerance Patient tolerated treatment well    Behavior During Therapy WFL for tasks assessed/performed              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   Past Surgical History:  Procedure Laterality Date   TUBAL LIGATION     Patient Active Problem List   Diagnosis Date Noted   Bradycardia 01/17/2022   Parkinson's disease 02/17/2020   Gait abnormality 12/04/2019   Prediabetes 12/03/2018   Vitamin D deficiency 12/03/2018   Anemia due to vitamin B12 deficiency 12/03/2018   Fatigue 12/03/2018   Urinary frequency 10/01/2018   Vitamin B12 deficiency 10/01/2018   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   Abnormal auditory perception of both ears 05/10/2016   Osteopenia 08/23/2012    ONSET DATE:  06/30/2022  REFERRING DIAG: G20.A1 (ICD-10-CM) - Parkinsons   THERAPY DIAG:  Muscle weakness (generalized)  Other symptoms and signs involving the nervous system  Unsteadiness on feet  Other abnormalities of gait and mobility  Rationale for Evaluation and Treatment: Rehabilitation  SUBJECTIVE:                                                                                                                                                                                              SUBJECTIVE STATEMENT: Had a good weekend. No changes since she was last here. Brought in her cane again today.   Pt accompanied by: family member daughter   PERTINENT HISTORY: PD, glaucoma, osteoporosis (newly diagnosed), TB (as a child)    PAIN:  Are you having pain? No  PRECAUTIONS: Fall and Other: Osteoporosis  WEIGHT BEARING RESTRICTIONS: No  FALLS: Has patient fallen in last 6 months? Yes. Number of falls 2  LIVING ENVIRONMENT: Lives with: lives with their family - daughter and granddaughter  Lives in: House/apartment Stairs: Yes: External: 2 steps; none Has following equipment at home: Single point cane and Grab bars  PLOF: Independent, pt's daughter reports sometimes pt needs help with dressing   PATIENT GOALS: See how she was since the last time she was here. Work on the balance   OBJECTIVE:    TODAY'S TREATMENT:       Therapeutic Activity:                                                                                                                           [facility] PT Assessment - 07/19/22 1237       Mini-BESTest   Sit To Stand Normal: Comes to stand without use of hands and stabilizes independently.    Rise to Toes < 3 s.    Stand on one leg (left) Moderate: < 20 s   3-4 seconds   Stand on one leg (right) Moderate: < 20 s   2-3 seconds   Stand on one leg - lowest score 1    Compensatory Stepping Correction - Forward Normal: Recovers independently with a single, large step (second realignement is allowed).    Compensatory Stepping Correction - Backward Moderate: More than one step is required to recover equilibrium   one small delayed step   Compensatory Stepping Correction - Left Lateral Moderate: Several steps to recover equilibrium   2-3 steps   Compensatory Stepping Correction - Right Lateral Severe:  Falls, or cannot step   unable to ellicit a step   Stepping Corredtion Lateral - lowest score 0    Stance - Feet together, eyes open,  firm surface  Normal: 30s    Stance - Feet together, eyes closed, foam surface  Normal: 30s    Incline - Eyes Closed Normal: Stands independently 30s and aligns with gravity    Change in Gait Speed Normal: Significantly changes walkling speed without imbalance    Walk with head turns - Horizontal Normal: performs head turns with no change in gait speed and good balance    Walk with pivot turns Moderate:Turns with feet close SLOW (>4 steps) with good balance.    Step over obstacles Moderate: Steps over box but touches box OR displays cautious behavior by slowing gait.    Timed UP & GO with Dual Task Moderate: Dual Task affects either counting OR walking (>10%) when compared to the TUG without Dual Task.    Mini-BEST total score 19            Discussed ways to obtain rollator - either through insurance (PT would have to send order request to doctor and have them addend their note or pt would have to wait until Feb to get her PCP to include this in their note) or can purchase out of pocket from [employer]/Medical supply store. Pt reports that she has money that she gets monthly from [employer] to  help cover medical costs and is planning on using this to cover the cost of rollator. Educated on benefits of rollator and to use for community distances (able to place items in basket and improved balance on unlevel surfaces). Showed also how to fold up to put it in car.  Educated on proper brake management prior to sit <> stand transfers.   GAIT: Gait pattern: step through pattern, decreased arm swing- Right, decreased arm swing- Left, decreased step length- Right, decreased step length- Left, decreased stride length, decreased ankle dorsiflexion- Right, decreased ankle dorsiflexion- Left, Right foot flat, Left foot flat, shuffling, lateral lean- Left, decreased trunk rotation, and trunk flexed Distance walked:345' x 2  Assistive device utilized: Single point cane, Rollator, None  Level of assistance:  SBA Comments: With no AD during miniBEST, cued for posture/looking ahead and incr step length. Pt brought in her SPC from home, adjusted it to proper height and worked on gait with cane including incr stride length/foot clearance, sequencing, and posture. Pt did better with sequencing today, but did need cues for upright posture throughout. During one instance where pt had to ambulate between a more narrow area, pt putting cane too close to BOS and almost tripped over it, but pt able to maintain balance.  Also trialed rollator for community distances with focus on tall posture, incr stride length, and staying close to rollator. Pt needing intermittent cues to stay closer to rollator, but overall pt did well with rollator. Pt reports feeling much steadier with the rollator for her balance and likes that people won't be able to get too close to her when she is ambulating with it.    PATIENT EDUCATION: Education details: Results of miniBEST, gait training with cane and rollator, see therapeutic activity section above.  Person educated: Patient and daughter  Education method: Explanation, Demonstration, and Verbal cues Education comprehension: verbalized understanding, returned demonstration, and needs further education  HOME EXERCISE PROGRAM: From previous bout of therapy: Will review/updated as needed.   Standing and seated PWR, Pt has in her exercise folder.     Access Code: [email or web address] URL: [email or web address] Date: 10/19/2021 Prepared by: [name]   Reviewed/condensed exercises for balance:    Exercises - Alternating Step Backward with Support  - 1 x daily - 5 x weekly - 1-2 sets - 10 reps - Romberg Stance Eyes Closed on Foam Pad  - 1 x daily - 5 x weekly - 3 sets - 30 hold   Plus standing tall against the wall for posture - 2 x 30 second holds    GOALS: Goals reviewed with patient? Yes  SHORT TERM GOALS: ALL STGS = LTGS   LONG TERM GOALS: Target date:  08/11/2022  Pt will be independent with final PD HEP for improved strength, balance, transfers, and gait  Baseline:  Goal status: INITIAL  2.  Pt will improve miniBEST to at least a 22/28 in order to demo decr fall risk Baseline: 19/28 Goal status: INITIAL  3.  Pt will improve gait speed with no AD vs. LRAD to at least 2.7 ft/sec in order to demo improved community mobility.   Baseline: 14.63 seconds with no AD = 2.24 ft/sec  Goal status: INITIAL  4.  Pt will improve manual TUG time to 14.5 seconds or less in order to demo decrease fall risk Baseline: 16.63 seconds  Goal status: INITIAL  5.  Pt will improve 5x sit<>stand to less than or equal to 14 sec to demonstrate improved functional strength and  transfer efficiency. Baseline: 15.9 seconds with no UE support  Goal status: INITIAL  6.  Pt will verbalize and demonstrate understanding of fall prevention in the home/community (use of appropriate AD, backing up into chairs, freezing strategies) Baseline:  Goal status: INITIAL  ASSESSMENT:  CLINICAL IMPRESSION: Finished performing the miniBEST today with pt scoring a 19/28, indicating an incr fall risk. LTG updated as appropriate. Remainder of session focused on gait training with SPC with focus on incr stride length and posture. Pt did better with sequencing today compared to prior visit. Did need intermittent cues throughout for posture/looking ahead. Also trialed a rollator for balance and for community distances with pt able to incr stride length and have better posture. Pt reporting feeling more balanced with this. Discussed ways to obtain one for community use, with pt and pt's daughter planning on going to a medical supply store to purchase one (using monthly funds she gets from [employer]). Will continue per POC.   OBJECTIVE IMPAIRMENTS: Abnormal gait, decreased activity tolerance, decreased balance, decreased cognition, decreased coordination, decreased knowledge of use of DME, decreased  mobility, difficulty walking, decreased strength, decreased safety awareness, impaired flexibility, impaired tone, and postural dysfunction.   ACTIVITY LIMITATIONS: carrying, lifting, bending, stairs, transfers, dressing, and locomotion level  PARTICIPATION LIMITATIONS: meal prep, cleaning, driving, shopping, and community activity  PERSONAL FACTORS: Age, Behavior pattern, Past/current experiences, Time since onset of injury/illness/exacerbation, and 3+ comorbidities: PD, glaucoma, osteoporosis (newly diagnosed), TB (as a child)    are also affecting patient's functional outcome.   REHAB POTENTIAL: Good  CLINICAL DECISION MAKING: Evolving/moderate complexity  EVALUATION COMPLEXITY: Moderate  PLAN:  PT FREQUENCY: 2x/week  PT DURATION: 8 weeks (anticipate maybe needing more than 4 weeks when pt initially got scheduled)  PLANNED INTERVENTIONS: Therapeutic exercises, Therapeutic activity, Neuromuscular re-education, Balance training, Gait training, Patient/Family education, Self Care, Stair training, Vestibular training, Visual/preceptual remediation/compensation, DME instructions, Manual therapy, and Re-evaluation  PLAN FOR NEXT SESSION: Continue to work on gait with cane and rollator - trying obstacles. Review HEP and update as needed. Work on turning all the way before sitting down to a chair/getting up from low surfaces. Posture awareness as pt with heavy lean to L. Lateral stepping strategies/weight shifting   Arliss Journey, PT, DPT  07/19/2022, 1:37 PM

## 2022-07-21 ENCOUNTER — Ambulatory Visit: Payer: Medicare Other | Admitting: Occupational Therapy

## 2022-07-21 ENCOUNTER — Ambulatory Visit: Payer: Medicare Other | Admitting: Physical Therapy

## 2022-07-21 ENCOUNTER — Ambulatory Visit: Payer: Medicare Other

## 2022-07-22 ENCOUNTER — Encounter: Payer: Self-pay | Admitting: Nurse Practitioner

## 2022-07-22 ENCOUNTER — Encounter: Payer: Self-pay | Admitting: Neurology

## 2022-07-25 ENCOUNTER — Other Ambulatory Visit: Payer: Self-pay | Admitting: Nurse Practitioner

## 2022-07-25 DIAGNOSIS — I2510 Atherosclerotic heart disease of native coronary artery without angina pectoris: Secondary | ICD-10-CM

## 2022-07-26 ENCOUNTER — Ambulatory Visit: Payer: Medicare Other | Admitting: Speech Pathology

## 2022-07-26 ENCOUNTER — Ambulatory Visit: Payer: Medicare Other | Admitting: Occupational Therapy

## 2022-07-26 ENCOUNTER — Encounter: Payer: Self-pay | Admitting: Occupational Therapy

## 2022-07-26 ENCOUNTER — Ambulatory Visit: Payer: Medicare Other | Admitting: Physical Therapy

## 2022-07-26 DIAGNOSIS — R293 Abnormal posture: Secondary | ICD-10-CM

## 2022-07-26 DIAGNOSIS — R278 Other lack of coordination: Secondary | ICD-10-CM

## 2022-07-26 DIAGNOSIS — R2681 Unsteadiness on feet: Secondary | ICD-10-CM

## 2022-07-26 DIAGNOSIS — R41841 Cognitive communication deficit: Secondary | ICD-10-CM | POA: Diagnosis not present

## 2022-07-26 DIAGNOSIS — R29818 Other symptoms and signs involving the nervous system: Secondary | ICD-10-CM | POA: Diagnosis not present

## 2022-07-26 DIAGNOSIS — R2689 Other abnormalities of gait and mobility: Secondary | ICD-10-CM

## 2022-07-26 DIAGNOSIS — R471 Dysarthria and anarthria: Secondary | ICD-10-CM | POA: Diagnosis not present

## 2022-07-26 DIAGNOSIS — M6281 Muscle weakness (generalized): Secondary | ICD-10-CM | POA: Diagnosis not present

## 2022-07-26 NOTE — Patient Instructions (Addendum)
Tell us about the formation of [facility] and some of the challenges and celebrations as you led the merged congregations?                           What advise do you have for congregations who want to become cross-cultural, and how can the [facility] support these congregations?

## 2022-07-26 NOTE — Therapy (Signed)
OUTPATIENT PHYSICAL THERAPY NEURO TREATMENT   Patient Name: Denise [name] MRN: [id number] DOB:[birth year], 72 y.o., female Today's Date: 07/26/2022   PCP: Minette Brine, FNP  REFERRING PROVIDER: Buck Mam Audrea Muscat, MD    END OF SESSION:  PT End of Session - 07/26/22 1314     Visit Number 3    Number of Visits 17    Date for PT Re-Evaluation 09/12/22    Authorization Type BCBS    PT Start Time 1243   Pt arrived late and requested to use restroom prior to session   PT Stop Time 1314    PT Time Calculation (min) 31 min    Equipment Utilized During Treatment Gait belt    Activity Tolerance Patient tolerated treatment well    Behavior During Therapy [employer] 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   Past Surgical History:  Procedure Laterality Date   TUBAL LIGATION     Patient Active Problem List   Diagnosis Date Noted   Bradycardia 01/17/2022   Parkinson's disease 02/17/2020   Gait abnormality 12/04/2019   Prediabetes 12/03/2018   Vitamin D deficiency 12/03/2018   Anemia due to vitamin B12 deficiency 12/03/2018   Fatigue 12/03/2018   Urinary frequency 10/01/2018   Vitamin B12 deficiency 10/01/2018   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 "So far so good". Is planning on going to medical supply store to obtain rollator soon. No falls.   Pt accompanied by: family member daughter   PERTINENT HISTORY: PD, glaucoma, osteoporosis (newly diagnosed), TB (as a child)    PAIN:  Are you having pain? No  PRECAUTIONS: Fall and Other: Osteoporosis  WEIGHT BEARING RESTRICTIONS: No  FALLS: Has patient fallen  in last 6 months? Yes. Number of falls 2  LIVING ENVIRONMENT: Lives with: lives with their family - daughter and granddaughter  Lives in: House/apartment Stairs: Yes: External: 2 steps; none Has following equipment at home: Single point cane and Grab bars  PLOF: Independent, pt's daughter reports sometimes pt needs help with dressing   PATIENT GOALS: See how she was since the last time she was here. Work on the balance   OBJECTIVE:   TODAY'S TREATMENT:       Personnel officer  GAIT: Gait pattern: step through pattern, decreased arm swing- Right, decreased arm swing- Left, decreased step length- Right, decreased step length- Left, decreased stride length, decreased ankle dorsiflexion- Right, decreased ankle dorsiflexion- Left, Right foot flat, Left foot flat, shuffling, lateral lean- Left, decreased trunk rotation, and trunk flexed Distance walked: 400'  Assistive device utilized: rollator   Level of assistance: SBA Comments: Practiced ambualting around obstacles and over thresholds w/rollator to imitate home and community environment. Pt able to lock/unlock rollator if allowed extra time to initiate prior to standing/sitting. Min cues for postural control and to stay inside rollator at all times, especially around sharp turns. Mod verbal cues for increased step length w/LLE throughout.   Performed obstacle course w/rollator using cones and trekking poles to imitate straightaways to continue practice navigating tight corners. Noted decreased attention  to obstacles on L side compared to R side. Min cues to sustain attention to L side as pt frequently hitting cones/trekking poles w/back L wheel. No LOB noted. Pt does assume forward flexed posture and lateral lean to L side.   NMR  In // bars for improved step length, lateral weight shifting and postural control: Alt fwd step over 4" beam, x8 per side without UE support. Pt initially required min A to perform but quickly progressed to CGA. Noted increased difficulty stepping w/RLE > LLE due to decreased weight shift to L side. Progressed to adding bilateral shoulder abduction w/each rep to facilitate upright posture, x5 reps per side. Min cues for open hands and wide arms throughout Lateral step overs using 4" foam beam, x12 per side. Noted increased difficulty stepping w/RLE > LLE due to decreased weight shift to L side. No instability noted. Min cues to look up to facilitate upright posture.      PATIENT EDUCATION: Education details: Continue HEP and encouragement to obtain rollator  Person educated: Patient and daughter  Education method: Explanation, Demonstration, and Verbal cues Education comprehension: verbalized understanding, returned demonstration, and needs further education  HOME EXERCISE PROGRAM: From previous bout of therapy: Will review/updated as needed.   Standing and seated PWR, Pt has in her exercise folder.     Access Code: [email or web address] URL: [email or web address] Date: 10/19/2021 Prepared by: [name]   Reviewed/condensed exercises for balance:    Exercises - Alternating Step Backward with Support  - 1 x daily - 5 x weekly - 1-2 sets - 10 reps - Romberg Stance Eyes Closed on Foam Pad  - 1 x daily - 5 x weekly - 3 sets - 30 hold   Plus standing tall against the wall for posture - 2 x 30 second holds    GOALS: Goals reviewed with patient? Yes  SHORT TERM GOALS: ALL STGS = LTGS   LONG TERM GOALS: Target date: 08/11/2022  Pt will be independent  with final PD HEP for improved strength, balance, transfers, and gait  Baseline:  Goal status: INITIAL  2.  Pt will improve miniBEST to at least a  22/28 in order to demo decr fall risk Baseline: 19/28 Goal status: INITIAL  3.  Pt will improve gait speed with no AD vs. LRAD to at least 2.7 ft/sec in order to demo improved community mobility.   Baseline: 14.63 seconds with no AD = 2.24 ft/sec  Goal status: INITIAL  4.  Pt will improve manual TUG time to 14.5 seconds or less in order to demo decrease fall risk Baseline: 16.63 seconds  Goal status: INITIAL  5.  Pt will improve 5x sit<>stand to less than or equal to 14 sec to demonstrate improved functional strength and transfer efficiency. Baseline: 15.9 seconds with no UE support  Goal status: INITIAL  6.  Pt will verbalize and demonstrate understanding of fall prevention in the home/community (use of appropriate AD, backing up into chairs, freezing strategies) Baseline:  Goal status: INITIAL  ASSESSMENT:  CLINICAL IMPRESSION: Session limited as pt arrived late and requesting to use restroom upon arrival. Emphasis of skilled PT session on obstacle navigation w/rollator and increased step length. Pt able to independently manage brakes on rollator if she is allowed extra time to initiate. Noted pt not attending to obstacles on L side of rollator and frequently hit cones/corners w/AD on L side. Pt demonstrates decreased weight shift to L side and L lateral truncal lean that she is able to correct w/cues. Continue POC.   OBJECTIVE IMPAIRMENTS: Abnormal gait, decreased activity tolerance, decreased balance, decreased cognition, decreased coordination, decreased knowledge of use of DME, decreased mobility, difficulty walking, decreased strength, decreased safety awareness, impaired flexibility, impaired tone, and postural dysfunction.   ACTIVITY LIMITATIONS: carrying, lifting, bending, stairs, transfers, dressing, and locomotion  level  PARTICIPATION LIMITATIONS: meal prep, cleaning, driving, shopping, and community activity  PERSONAL FACTORS: Age, Behavior pattern, Past/current experiences, Time since onset of injury/illness/exacerbation, and 3+ comorbidities: PD, glaucoma, osteoporosis (newly diagnosed), TB (as a child)    are also affecting patient's functional outcome.   REHAB POTENTIAL: Good  CLINICAL DECISION MAKING: Evolving/moderate complexity  EVALUATION COMPLEXITY: Moderate  PLAN:  PT FREQUENCY: 2x/week  PT DURATION: 8 weeks (anticipate maybe needing more than 4 weeks when pt initially got scheduled)  PLANNED INTERVENTIONS: Therapeutic exercises, Therapeutic activity, Neuromuscular re-education, Balance training, Gait training, Patient/Family education, Self Care, Stair training, Vestibular training, Visual/preceptual remediation/compensation, DME instructions, Manual therapy, and Re-evaluation  PLAN FOR NEXT SESSION: Continue to work on gait with cane and rollator - trying obstacles. Review HEP and update as needed. Work on turning all the way before sitting down to a chair/getting up from low surfaces. Posture awareness as pt with heavy lean to L. Lateral stepping strategies/weight shifting   Kharisma Glasner E Jermesha Sottile, PT, DPT  07/26/2022, 1:15 PM

## 2022-07-26 NOTE — Therapy (Signed)
OUTPATIENT OCCUPATIONAL THERAPY PARKINSON'S EVALUATION  Patient Name: Denise [name] MRN: [id number] DOB:[birth year], 72 y.o., female Today's Date: 07/26/2022  PCP: Minette Brine, FNP REFERRING PROVIDER: Dr. Audrea Muscat Thenganatt  END OF SESSION:  OT End of Session - 07/26/22 1656     Visit Number 1    Number of Visits 17    Date for OT Re-Evaluation 09/24/22    Authorization Type BCBS [place] 2024,  No Deductible,  [place]; [place]  Auth Not reqd    Authorization - Visit Number 1    Authorization - Number of Visits 10    Progress Note Due on Visit 10    OT Start Time 1405    OT Stop Time 1450    OT Time Calculation (min) 45 min    Activity Tolerance Patient tolerated treatment well    Behavior During Therapy WFL for tasks assessed/performed             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   Past Surgical History:  Procedure Laterality Date   TUBAL LIGATION     Patient Active Problem List   Diagnosis Date Noted   Bradycardia 01/17/2022   Parkinson's disease 02/17/2020   Gait abnormality 12/04/2019   Prediabetes 12/03/2018   Vitamin D deficiency 12/03/2018   Anemia due to vitamin B12 deficiency 12/03/2018   Fatigue 12/03/2018   Urinary frequency 10/01/2018   Vitamin B12 deficiency 10/01/2018   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   Abnormal auditory perception of both ears 05/10/2016   Osteopenia 08/23/2012    ONSET DATE: 07/02/23  REFERRING DIAG: Parkinson's Disease  THERAPY DIAG:  Other symptoms and signs involving the nervous system  Other lack of coordination  Abnormal posture  Unsteadiness on feet  Rationale for Evaluation and Treatment: Rehabilitation  SUBJECTIVE:   SUBJECTIVE STATEMENT: Pt/dtr report harder time with memory  Pt accompanied by:   dtr  PERTINENT HISTORY: Parkinson's Disease, last seen by OT 12/2021 (recommended 6 month re-evaluation at OT d/c)  PMH:  Osteopenia, glaucoma, prediabetes, Vitamin D deficiency, anemia due to B12 deficiency, bradycardia    PRECAUTIONS: Fall  WEIGHT BEARING RESTRICTIONS: No  PAIN:  Are you having pain? No  FALLS: Has patient fallen in last 6 months? Yes. Number of falls 2  at granddaughter's dance practice  LIVING ENVIRONMENT: Lives with: lives with their family and lives with their daughter Lives in: House/apartment Stairs: No Has following equipment at home: Grab bars  PLOF: Independent, no longer driving  PATIENT GOALS:improve balance, coordination   OBJECTIVE:   HAND DOMINANCE: Right  ADLs: Overall ADLs: difficulty sequencing at times, uses checklist prior to leaving house Transfers/ambulation related to ADLs: Eating: difficulty cutting food with fork/knife, some difficulty opening bottles/packages Grooming: mod I UB Dressing: difficulty getting shirts over head, difficulty putting earrings in at times LB Dressing: slide in shoes most of the time (can tie) Toileting: some difficulty with sitting down on toilet, some urgency, forgetting to flush Bathing: Washes at the sink in standing   Tub Shower transfers: washes at the sink   IADLs: Light housekeeping: assists daughter  Meal Prep: assists daughter with tasks Community mobility: no longer drives Medication management: dtr supervises/assists Handwriting:  illegible at time per pt  MOBILITY STATUS: Independent  and Freezing  POSTURE COMMENTS:  rounded shoulders, forward head, and weight shift left  FUNCTIONAL OUTCOME MEASURES: Physical performance test: PPT#2 (simulated eating) Not tested & PPT#4 (donning/doffing jacket): 24sec  COORDINATION: 9 Hole Peg test: Right: 30.53 sec; Left: 47 sec Box and Blocks:  Right 43blocks, Left 42blocks bradykinesia noted LUE  UE ROM:  BUEs grossly  WFL (approx 140*  shoulder flex with elbow ext bilaterally)  UE MMT:   Not tested  SENSATION: Not tested  MUSCLE TONE: RUE: Within functional limits and LUE: Within functional limits  COGNITION: Overall cognitive status: Impaired  Pt with impaired memory, decr awareness of postural changes, bradyphrenia, and difficulty sequencing at times per dtr  OBSERVATIONS: Bradykinesia, flat affect, decr eye contact and head turns   TODAY'S TREATMENT:                                                                                                                              DATE: 07/26/22  Eval completed.  See pt education.    PATIENT EDUCATION: Education details: OT eval results and POC; techniques for stand>sit transfer at toilet, sit>stand at table (focus on large movement strategies) to incr safety Person educated: Patient and Child(ren) Education method: Explanation and Demonstration Education comprehension: verbalized understanding  HOME EXERCISE PROGRAM: Not yet instructed.  GOALS: Potential Goals reviewed with patient? Yes  SHORT TERM GOALS: Target date: 08/25/22  Pt will be independent with updated HEP. Goal status: INITIAL  2.  Pt will report incr ease with donning pull-over shirt without assist consistently. Goal status: INITIAL  3.  Pt will verbalize understanding of ways to prevent future PD related complications and appropriate community resources. Goal status: INITIAL  4.  Pt will be able to write at least 2 sentences with good legibility. Goal status: INITIAL   LONG TERM GOALS: Target date: 09/23/22  Pt/caregiver will verbalize understanding of adaptive strategies/AE to incr safety and ease with ADLs/IADLs. Goal status: INITIAL  2.  Pt will don/doff jacket in less than 20sec for incr ease using strategies prn. Baseline:  24sec Goal status: INITIAL  3.  Pt will improve L hand coordination for ADLs/IADLs as shown by decr time on 9-hole peg test by at least 8sec.  Baseline:   47sec Goal status: INITIAL  4.  Pt will improve functional reaching/coordination as shown by improving score on box and blocks test by at least 4 bilaterally. Baseline: R-43, L-42 Goal status: INITIAL  5.  Pt/dtr will verbalize understanding of cognitive compensation strategies for ADLs/IADLs. Goal status: INITIAL   ASSESSMENT:  CLINICAL IMPRESSION: Patient is a 72 y.o. female who was seen today for occupational therapy evaluation for Parkinson's Disease.  Pt was last seen by OT 12/2021.  Pt/dtr report incr difficulty with ADLs.  Pt with PMH that includes:  Osteopenia, glaucoma, prediabetes, Vitamin D deficiency, anemia due to B12 deficiency, bradycardia.  Pt presents today with bradykinesia, decr posture, decr coordination, decr balance/functional mobility, and cognitive  deficits.  Pt would benefit from occupational therapy to address these deficits for improved quality of life, incr ease/safety/independence with ADLs/IADLs, and to decr risk of future complications related to PD.    PERFORMANCE DEFICITS: in functional skills including ADLs, IADLs, coordination, dexterity, Fine motor control, mobility, balance, decreased knowledge of use of DME, and UE functional use, cognitive skills including memory, safety awareness, and sequencing, and psychosocial skills including environmental adaptation and routines and behaviors.   IMPAIRMENTS: are limiting patient from ADLs, IADLs, and leisure.   COMORBIDITIES:  may have co-morbidities  that affects occupational performance. Patient will benefit from skilled OT to address above impairments and improve overall function.  MODIFICATION OR ASSISTANCE TO COMPLETE EVALUATION: Min-Moderate modification of tasks or assist with assess necessary to complete an evaluation.  OT OCCUPATIONAL PROFILE AND HISTORY: Detailed assessment: Review of records and additional review of physical, cognitive, psychosocial history related to current functional  performance.  CLINICAL DECISION MAKING: Moderate - several treatment options, min-mod task modification necessary  REHAB POTENTIAL: Good  EVALUATION COMPLEXITY: Moderate    PLAN:  OT FREQUENCY: 2x/week  OT DURATION: 8 weeks +eval  PLANNED INTERVENTIONS: self care/ADL training, therapeutic exercise, therapeutic activity, neuromuscular re-education, manual therapy, passive range of motion, balance training, functional mobility training, splinting, paraffin, fluidotherapy, moist heat, cryotherapy, patient/family education, cognitive remediation/compensation, and DME and/or AE instructions  RECOMMENDED OTHER SERVICES: current with PT, ST  CONSULTED AND AGREED WITH PLAN OF CARE: Patient and family Midwife (dtr)  PLAN FOR NEXT SESSION: review/update HEP   Horace Wishon, OTR/L 07/26/2022, 5:01 PM

## 2022-07-26 NOTE — Therapy (Signed)
OUTPATIENT SPEECH LANGUAGE PATHOLOGY PARKINSON'S TREATMENT   Patient Name: Denise [name] MRN: [id number] DOB:[birth year], 72 y.o., female Today's Date: 07/26/2022  PCP: Minette Brine FNP REFERRING PROVIDER: Thenganatt   END OF SESSION:  End of Session - 07/26/22 1308     Visit Number 3    Number of Visits 17    Date for SLP Re-Evaluation 09/07/22    Authorization Type BCBS    SLP Start Time 1315    SLP Stop Time  1400    SLP Time Calculation (min) 45 min    Activity Tolerance Patient tolerated treatment well               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   Past Surgical History:  Procedure Laterality Date   TUBAL LIGATION     Patient Active Problem List   Diagnosis Date Noted   Bradycardia 01/17/2022   Parkinson's disease 02/17/2020   Gait abnormality 12/04/2019   Prediabetes 12/03/2018   Vitamin D deficiency 12/03/2018   Anemia due to vitamin B12 deficiency 12/03/2018   Fatigue 12/03/2018   Urinary frequency 10/01/2018   Vitamin B12 deficiency 10/01/2018   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 No  PATIENT GOALS: improve confidence while speaking  OBJECTIVE:   TODAY'S TREATMENT:                                                                                                                                           07-26-22: SLP provided education on potential metrics which may aid in improved carryover of intentional speech and increased vocal intensity. Encouraged pt to think about perceived effort level, reading facial expression of conversational partner, noting when being ask to repeat, making self-inquiry as to whether pt is channeling her "preacher" voice. Education on for improved communication efficacy, pt should not rely on using her "normal" voice. For improved volume, she needs to avoid what feels normal and speak with more effort/volume. Generated visual cue for hand to ear to aid in cueing from daughter when she does not want to interrupt in middle of speech for verbal cue. Pt with her questions for panel, SLP provided aid for generating key points Ima wants to relay in answering  questions on panel. SLP asked pt to provided brief introduction to practice- pt with reduced intensity (64 dB) and usual extended pauses. SLP encourages pt to write key points and practice to mitigate complexities of public speaking task.   07-19-22: Generated strategy and visual aid to aid recall of necessary items for departure. With structured repetitive practice, pt able to recall 5 needed items with mod I by end of session. Provided instructions and visual aid to implement at home. Pt continues with online Speak Out! Lessons daily. Conversational volume initially averaged mid 60s dB.Targeted improving vocal quality and increasing intensity through progressively difficulty speech tasks using Speak Out! program, lesson 1 review. ST leads pt through exercises providing occasional model prior to pt execution. occasional min-A required to achieve target dB this date. Averages this date: loud "ah" 86 dB; reading 77 dB; cognitive speech task 73 dB. Conversational sample of approx 5 minutes, pt averages low 70s dB with occasional min-A.  07-13-22: Initiated education and instruction of memory compensations to aid daily functioning  at home (sign with needed items by door). Pt and daughter verbalized understanding and agreement with SLP recommendations and POC.   PATIENT EDUCATION: Education details: see above Person educated: Patient and Child(ren) Education method: Customer service manager Education comprehension: verbalized understanding, returned demonstration, and needs further education  HOME EXERCISE PROGRAM: Speak Out! Program    GOALS: Goals reviewed with patient? Yes  SHORT TERM GOALS: Target date: 08/10/2022  Pt will achieve targeted dB levels in demonstration of Speak Out! Lessons with 90% accuracy given rare min A over 2 sessions  Baseline: Goal status: IN PROGRESS  2.  Pt will utilize dysarthria compensations in 5-10 minute conversation and average WNL conversational volume (70-72 dB) given occasional min A over 2 sessions   Baseline:  Goal status: IN PROGRESS  3.  Pt and family will carryover memory compensations to aid daily functioning at home (recalling needed items/daily tasks) given occasional min A over 2 sessions  Baseline:  Goal status: IN PROGRESS  4.  Pt will carryover dysarthria strategies in public speaking role play to be 100% intelligible given occasional min A Baseline:  Goal status: IN PROGRESS  LONG TERM GOALS: Target date: 09/07/2022  Pt will utilize dysarthria compensations in 15+ minute conversation to optimize vocal intensity and clarity given rare min A over 2 sessions Baseline:  Goal status: IN PROGRESS  2.  Pt and family will carryover memory compensations to aid daily functioning at home (recalling needed items/daily tasks) with mod I  Baseline:  Goal status: IN PROGRESS  3.  Pt will subjectively report increased confidence and attend increased number of events per family baseline  Baseline:  Goal status: IN PROGRESS   ASSESSMENT:  CLINICAL IMPRESSION: Patient is a 72 y.o. Female who was seen today for progression of Parkinson's Disease. Today, pt  presents with mild hypokinetic dysarthria with initial conversational volume averaging mid 60s dB. Following re-education of principle of intent and Speak Out! program, pt able to maintain low 70s dB in conversation with occasional min A. Reduced confidence speaking reported by her daughter resulting in patient avoiding typical social situations. Some subtle but frustrating short term recall changes endorsed related to forgetting items and daily tasks, with education and instruction of memory compensations initiated today. Pt would benefit from skilled ST intervention to optimize confidence and communication effectiveness as well as maximize cognitive functioning with cognitive compensation training.   OBJECTIVE IMPAIRMENTS: Objective impairments include memory and dysarthria. These impairments are limiting  patient from household responsibilities and effectively communicating at home and in community.Factors affecting potential to achieve goals and functional outcome are medical prognosis.. Patient will benefit from skilled SLP services to address above impairments and improve overall function.  REHAB POTENTIAL: Good  PLAN:  SLP FREQUENCY: 2x/week  SLP DURATION: 8 weeks  PLANNED INTERVENTIONS: Language facilitation, Environmental controls, Cueing hierachy, Cognitive reorganization, Internal/external aids, Functional tasks, Multimodal communication approach, SLP instruction and feedback, Compensatory strategies, and Patient/family education    Su Monks, CCC-SLP 07/26/2022, 1:09 PM

## 2022-07-27 NOTE — Therapy (Signed)
OUTPATIENT OCCUPATIONAL THERAPY PARKINSON'S TREATMENT  Patient Name: Denise [name] MRN: [id number] DOB:1950/11/25, 72 y.o., female Today's Date: 07/28/2022  PCP: Minette Brine, FNP REFERRING PROVIDER: Dr. Audrea Muscat Thenganatt  END OF SESSION:  OT End of Session - 07/28/22 1441     Visit Number 2    Number of Visits 17    Date for OT Re-Evaluation 09/24/22    Authorization Type BCBS [place] 2024,  No Deductible,  [place]; [place]  Auth Not reqd    Authorization - Visit Number 2    Authorization - Number of Visits 10    Progress Note Due on Visit 10    OT Start Time 1407    OT Stop Time 1450    OT Time Calculation (min) 43 min    Activity Tolerance Patient tolerated treatment well    Behavior During Therapy WFL for tasks assessed/performed              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   Past Surgical History:  Procedure Laterality Date   TUBAL LIGATION     Patient Active Problem List   Diagnosis Date Noted   Bradycardia 01/17/2022   Parkinson's disease 02/17/2020   Gait abnormality 12/04/2019   Prediabetes 12/03/2018   Vitamin D deficiency 12/03/2018   Anemia due to vitamin B12 deficiency 12/03/2018   Fatigue 12/03/2018   Urinary frequency 10/01/2018   Vitamin B12 deficiency 10/01/2018   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   Pt accompanied by:  dtr  PERTINENT HISTORY:  Parkinson's Disease, last seen by OT 12/2021 (recommended 6 month re-evaluation at OT d/c)  PMH:  Osteopenia, glaucoma, prediabetes, Vitamin D deficiency, anemia due to B12 deficiency, bradycardia    PRECAUTIONS: Fall  WEIGHT BEARING RESTRICTIONS: No  PAIN:  Are you having pain? No  FALLS: Has patient fallen in last 6 months? Yes. Number of falls 2  at granddaughter's dance practice  LIVING ENVIRONMENT: Lives with: lives with their family and lives with their daughter Lives in: House/apartment Stairs: No Has following equipment at home: Grab bars  PLOF: Independent, no longer driving  PATIENT GOALS:improve balance, coordination   OBJECTIVE:   HAND DOMINANCE: Right  ADLs: Overall ADLs: difficulty sequencing at times, uses checklist prior to leaving house Transfers/ambulation related to ADLs: Eating: difficulty cutting food with fork/knife, some difficulty opening bottles/packages Grooming: mod I UB Dressing: difficulty getting shirts over head, difficulty putting earrings in at times LB Dressing: slide in shoes most of the time (can tie) Toileting: some difficulty with sitting down on toilet, some urgency, forgetting to flush Bathing: Washes at the sink in standing   Tub Shower transfers: washes at the sink   IADLs: Light housekeeping: assists daughter  Meal Prep: assists daughter with tasks Community mobility: no longer drives Medication management: dtr supervises/assists Handwriting:  illegible at time per pt  MOBILITY STATUS: Independent  and Freezing  POSTURE COMMENTS:  rounded shoulders, forward head, and weight shift left  FUNCTIONAL OUTCOME MEASURES: Physical performance test: PPT#2 (simulated eating) Not tested & PPT#4 (donning/doffing jacket): 24sec  COORDINATION: 9 Hole Peg test: Right: 30.53 sec; Left: 47 sec Box and Blocks:  Right 43blocks, Left 42blocks bradykinesia noted LUE  UE ROM:  BUEs grossly  WFL (approx 140* shoulder flex with elbow ext  bilaterally)  UE MMT:   Not tested  SENSATION: Not tested  MUSCLE TONE: RUE: Within functional limits and LUE: Within functional limits  COGNITION: Overall cognitive status: Impaired  Pt with impaired memory, decr awareness of postural changes, bradyphrenia, and difficulty sequencing at times per dtr  OBSERVATIONS: Bradykinesia, flat affect, decr eye contact and head turns   TODAY'S TREATMENT:                                                                                                                              DATE: 07/28/22   Pt/family instructed in updated HEP--see pt instructions   Writing 8 sentences with good size/legibility.  Reviewed writing strategies.   PATIENT EDUCATION: Education details: HEP (basic PWR! Hands, coordination, PWR! Up in sitting, seated shoulder flex and diagonals with ball) Person educated: Patient and Child(ren) Education method: Explanation, Demonstration, Tactile cues, Verbal cues, and Handouts Education comprehension: verbalized understanding, returned demonstration, verbal cues required, and tactile cues required  HOME EXERCISE PROGRAM: Not yet instructed.  GOALS: Potential Goals reviewed with patient? Yes  SHORT TERM GOALS: Target date: 08/25/22  Pt will be independent with updated HEP. Goal status: INITIAL  2.  Pt will report incr ease with donning pull-over shirt without assist consistently. Goal status: INITIAL  3.  Pt will verbalize understanding of ways to prevent future PD related complications and appropriate community resources. Goal status: INITIAL  4.  Pt will be able to write at least 2 sentences with good legibility and size. Goal status: MET 07/28/22   LONG TERM GOALS: Target date: 09/23/22  Pt/caregiver will verbalize understanding of adaptive strategies/AE to incr safety and ease with ADLs/IADLs. Goal status: INITIAL  2.  Pt will don/doff jacket in less than 20sec for incr ease using strategies prn. Baseline:   24sec Goal status: INITIAL  3.  Pt will improve L hand coordination for ADLs/IADLs as shown by decr time on 9-hole peg test by at least 8sec.  Baseline:  47sec Goal status: INITIAL  4.  Pt will improve functional reaching/coordination as shown by improving score on box and blocks test by at least 4 bilaterally. Baseline: R-43, L-42 Goal status: INITIAL  5.  Pt/dtr will verbalize understanding of cognitive compensation strategies for ADLs/IADLs. Goal status: INITIAL   ASSESSMENT:  CLINICAL IMPRESSION: Pt is progressing towards goals with good understanding of HEP.  Pt needed min cueing for large amplitude movements and utilized mirror for posture.   PERFORMANCE DEFICITS: in functional skills including ADLs, IADLs, coordination, dexterity, Fine motor control, mobility, balance, decreased  knowledge of use of DME, and UE functional use, cognitive skills including memory, safety awareness, and sequencing, and psychosocial skills including environmental adaptation and routines and behaviors.   IMPAIRMENTS: are limiting patient from ADLs, IADLs, and leisure.   COMORBIDITIES:  may have co-morbidities  that affects occupational performance. Patient will benefit from skilled OT to address above impairments and improve overall function.  MODIFICATION OR ASSISTANCE TO COMPLETE EVALUATION: Min-Moderate modification of tasks or assist with assess necessary to complete an evaluation.  OT OCCUPATIONAL PROFILE AND HISTORY: Detailed assessment: Review of records and additional review of physical, cognitive, psychosocial history related to current functional performance.  CLINICAL DECISION MAKING: Moderate - several treatment options, min-mod task modification necessary  REHAB POTENTIAL: Good  EVALUATION COMPLEXITY: Moderate   PLAN:  OT FREQUENCY: 2x/week  OT DURATION: 8 weeks +eval  PLANNED INTERVENTIONS: self care/ADL training, therapeutic exercise, therapeutic activity, neuromuscular  re-education, manual therapy, passive range of motion, balance training, functional mobility training, splinting, paraffin, fluidotherapy, moist heat, cryotherapy, patient/family education, cognitive remediation/compensation, and DME and/or AE instructions  RECOMMENDED OTHER SERVICES: current with PT, ST  CONSULTED AND AGREED WITH PLAN OF CARE: Patient and family Midwife (dtr)  PLAN FOR NEXT SESSION:  review strategies for ADLs   Kadence Mikkelson, OTR/L 07/28/2022, 3:29 PM

## 2022-07-28 ENCOUNTER — Ambulatory Visit: Payer: Medicare Other

## 2022-07-28 ENCOUNTER — Encounter: Payer: Self-pay | Admitting: Occupational Therapy

## 2022-07-28 ENCOUNTER — Ambulatory Visit: Payer: Medicare Other | Admitting: Physical Therapy

## 2022-07-28 ENCOUNTER — Ambulatory Visit: Payer: Medicare Other | Admitting: Occupational Therapy

## 2022-07-28 DIAGNOSIS — R293 Abnormal posture: Secondary | ICD-10-CM

## 2022-07-28 DIAGNOSIS — R29818 Other symptoms and signs involving the nervous system: Secondary | ICD-10-CM | POA: Diagnosis not present

## 2022-07-28 DIAGNOSIS — R2689 Other abnormalities of gait and mobility: Secondary | ICD-10-CM

## 2022-07-28 DIAGNOSIS — M6281 Muscle weakness (generalized): Secondary | ICD-10-CM | POA: Diagnosis not present

## 2022-07-28 DIAGNOSIS — R471 Dysarthria and anarthria: Secondary | ICD-10-CM

## 2022-07-28 DIAGNOSIS — R2681 Unsteadiness on feet: Secondary | ICD-10-CM

## 2022-07-28 DIAGNOSIS — R278 Other lack of coordination: Secondary | ICD-10-CM | POA: Diagnosis not present

## 2022-07-28 DIAGNOSIS — R41841 Cognitive communication deficit: Secondary | ICD-10-CM | POA: Diagnosis not present

## 2022-07-28 NOTE — Therapy (Signed)
OUTPATIENT SPEECH LANGUAGE PATHOLOGY PARKINSON'S TREATMENT   Patient Name: Denise [name] MRN: [id number] DOB:February 10, 1951, 72 y.o., female Today's Date: 07/28/2022  PCP: Minette Brine FNP REFERRING PROVIDER: Thenganatt   END OF SESSION:  End of Session - 07/28/22 1327     Visit Number 4    Number of Visits 17    Date for SLP Re-Evaluation 09/07/22    Authorization Type BC[employer]    SLP Start Time 1315    SLP Stop Time  1400    SLP Time Calculation (min) 45 min    Activity Tolerance Patient tolerated treatment well             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   Past Surgical History:  Procedure Laterality Date   TUBAL LIGATION     Patient Active Problem List   Diagnosis Date Noted   Bradycardia 01/17/2022   Parkinson's disease 02/17/2020   Gait abnormality 12/04/2019   Prediabetes 12/03/2018   Vitamin D deficiency 12/03/2018   Anemia due to vitamin B12 deficiency 12/03/2018   Fatigue 12/03/2018   Urinary frequency 10/01/2018   Vitamin B12 deficiency 10/01/2018   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 No  PATIENT GOALS: improve confidence while speaking  OBJECTIVE:   TODAY'S TREATMENT:                                                                                                                                           07-28-22: Pt accompanied by family members. Averaged 63 dB at start of session. SLP led pt through Speak Out! Lesson 9 and discussed the importance of HEP to warm up her voice. Provided model, pt demonstrated significant improvement in volume at start of warm up. Provided re-education concerning importance of intent. Pt brought notes from a panel she plans to speak out on Saturday. Clinician utilized presented notes to implement Speak Out! Strategies. Pt required consistent fading to intermittent verbal and visual cues to increase volume during conversational task. SLP encouraged pt to implement her own visual cues to improve speech volume. At start and end of session, SLP requested pt repeat list of items she needs to remember when she leaves the  house. Provided education on using visual aids at home to aid recall of pertinent items.   Ah average: 88 dB Conversation average: 68 dB    07-26-22: SLP provided education on potential metrics which may aid in improved carryover of intentional speech and increased vocal intensity. Encouraged pt to think about perceived effort level, reading facial expression of conversational partner, noting when being ask to repeat, making self-inquiry as to whether pt is channeling her "preacher" voice. Education on for improved communication efficacy, pt should not rely on using her "normal" voice. For improved volume, she needs to avoid what feels normal and speak with more effort/volume. Generated visual cue for hand to ear to aid in cueing from daughter when she does not want to interrupt in middle of speech for verbal cue. Pt with her questions for panel, SLP provided aid for generating key points Denise [name] wants to relay in answering questions on panel. SLP asked pt to provided brief introduction to practice- pt with reduced intensity (64 dB) and usual extended pauses. SLP encourages pt to write key points and practice to mitigate complexities of  public speaking task.   07-19-22: Generated strategy and visual aid to aid recall of necessary items for departure. With structured repetitive practice, pt able to recall 5 needed items with mod I by end of session. Provided instructions and visual aid to implement at home. Pt continues with online Speak Out! Lessons daily. Conversational volume initially averaged mid 60s dB.Targeted improving vocal quality and increasing intensity through progressively difficulty speech tasks using Speak Out! program, lesson 1 review. ST leads pt through exercises providing occasional model prior to pt execution. occasional min-A required to achieve target dB this date. Averages this date: loud "ah" 86 dB; reading 77 dB; cognitive speech task 73 dB. Conversational sample of approx 5 minutes, pt averages low 70s dB with occasional min-A.  PATIENT EDUCATION: Education details: see above Person educated: Patient and Child(ren) Education method: Customer service manager Education comprehension: verbalized understanding, returned demonstration, and needs further education  HOME EXERCISE PROGRAM: Speak Out! Program    GOALS: Goals reviewed with patient? Yes  SHORT TERM GOALS: Target date: 08/10/2022  Pt will achieve targeted dB levels in demonstration of Speak Out! Lessons with 90% accuracy given rare min A over 2 sessions  Baseline: Goal status: IN PROGRESS  2.  Pt will utilize dysarthria compensations in 5-10 minute conversation and average WNL conversational volume (70-72 dB) given occasional min A over 2 sessions   Baseline:  Goal status: IN PROGRESS  3.  Pt and family will carryover memory compensations to aid daily functioning at home (recalling needed items/daily tasks) given occasional min A over 2 sessions  Baseline: 07-28-22 Goal status: IN PROGRESS  4.  Pt will carryover dysarthria strategies in public speaking role play to be 100% intelligible given occasional min A Baseline:  Goal status: IN  PROGRESS  LONG TERM GOALS: Target date: 09/07/2022  Pt will utilize dysarthria compensations in 15+ minute conversation to optimize vocal intensity and clarity given rare min A over 2 sessions Baseline:  Goal status: IN PROGRESS  2.  Pt and family will carryover memory compensations to aid daily functioning at home (recalling needed items/daily tasks) with mod I  Baseline:  Goal status: IN PROGRESS  3.  Pt will subjectively report increased confidence and attend increased number of events per family baseline  Baseline:  Goal status: IN PROGRESS   ASSESSMENT:  CLINICAL IMPRESSION: Patient is a 72 y.o. Female who was  seen today for progression of Parkinson's Disease. Today, pt presents with mild hypokinetic dysarthria with initial conversational volume averaging mid 60s dB. Following re-education of principle of intent and Speak Out! program, pt averaged upper 60 dB speech volume. Education and instruction of memory compensations continued today. Pt would benefit from skilled ST intervention to optimize confidence and communication effectiveness as well as maximize cognitive functioning with cognitive compensation training.   OBJECTIVE IMPAIRMENTS: Objective impairments include memory and dysarthria. These impairments are limiting patient from household responsibilities and effectively communicating at home and in community.Factors affecting potential to achieve goals and functional outcome are medical prognosis.. Patient will benefit from skilled SLP services to address above impairments and improve overall function.  REHAB POTENTIAL: Good  PLAN:  SLP FREQUENCY: 2x/week  SLP DURATION: 8 weeks  PLANNED INTERVENTIONS: Language facilitation, Environmental controls, Cueing hierachy, Cognitive reorganization, Internal/external aids, Functional tasks, Multimodal communication approach, SLP instruction and feedback, Compensatory strategies, and Patient/family education    Leroy Libman,  Student-SLP 07/28/2022, 3:12 PM

## 2022-07-28 NOTE — Patient Instructions (Addendum)
    SHOULDER: Flexion Bilateral    Sit, Hold ball in both hands with hands flat on the ball.  Bring ball to the floor and then raise arms overhead at same speed. Keep elbows straight.  Use mirror to help make sure you are sitting up straight.   10 reps per set, 1 sets per day    Bilateral Arm Motion on Diagonal    With feet flat on floor, move both arms up to right and down to left while holding ball with both hands. Repeat 10 times per session. Do 1 sessions per day. Repeat on to the other side   PWR! Hand Exercises  Then, start with elbows bent and hands closed and push hands out BIG. Elbows straight, wrists up, fingers open and spread apart BIG.   ** Make each movement big and deliberate so that you feel the movement.  Perform at least 10 repetitions 1x/day    Coordination Exercises   Perform the following exercises for 20 minutes 1 times per day. Perform with both hand(s). Perform using big movements.   Flipping Cards: Place deck of cards on the table. Flip cards over by opening your hand big to grasp and then turn your palm up big. Deal cards: Hold 1/2 or whole deck in your hand. Use thumb to push card off top of deck with one big push. Rotate ball with fingertips: Pick up with fingers/thumb and move as much as you can with each turn/movement (clockwise and counter-clockwise). Pick up coins and stack one at a time: Pick up with big, intentional movements. Do not drag coin to the edge. (5-10 in a stack) Pick up 5-10 coins one at a time and hold in palm. Then, move coins from palm to fingertips one at time and place in coin bank/container. Practice writing:  slow down, use lined paper, focus on keeping size big and forming each letter.

## 2022-07-28 NOTE — Therapy (Signed)
OUTPATIENT PHYSICAL THERAPY NEURO TREATMENT   Patient Name: Denise [name] MRN: [id number] DOB:1950-12-26, 72 y.o., female Today's Date: 07/28/2022   PCP: Minette Brine, FNP  REFERRING PROVIDER: Buck Mam Audrea Muscat, MD    END OF SESSION:  PT End of Session - 07/28/22 1244     Visit Number 4    Number of Visits 17    Date for PT Re-Evaluation 09/12/22    Authorization Type BCBS    PT Start Time 1242   Pt arrived late   PT Stop Time 1316    PT Time Calculation (min) 34 min    Equipment Utilized During Treatment Gait belt    Activity Tolerance Patient tolerated treatment well    Behavior During Therapy WFL for tasks assessed/performed                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   Past Surgical History:  Procedure Laterality Date   TUBAL LIGATION     Patient Active Problem List   Diagnosis Date Noted   Bradycardia 01/17/2022   Parkinson's disease 02/17/2020   Gait abnormality 12/04/2019   Prediabetes 12/03/2018   Vitamin D deficiency 12/03/2018   Anemia due to vitamin B12 deficiency 12/03/2018   Fatigue 12/03/2018   Urinary frequency 10/01/2018   Vitamin B12 deficiency 10/01/2018   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 Reports she ordered a rollator on 1/23, should have it by next session. HEP is going well, daughter is helping to modify exercises as needed  Pt accompanied by: family member daughter   PERTINENT HISTORY: PD, glaucoma, osteoporosis (newly diagnosed), TB (as a child)    PAIN:  Are you having pain? No  PRECAUTIONS: Fall and Other: Osteoporosis  WEIGHT BEARING RESTRICTIONS: No  FALLS: Has patient fallen in  last 6 months? Yes. Number of falls 2  LIVING ENVIRONMENT: Lives with: lives with their family - daughter and granddaughter  Lives in: House/apartment Stairs: Yes: External: 2 steps; none Has following equipment at home: Single point cane and Grab bars  PLOF: Independent, pt's daughter reports sometimes pt needs help with dressing   PATIENT GOALS: See how she was since the last time she was here. Work on the balance   OBJECTIVE:   TODAY'S TREATMENT:       NMR  In // bars for improved midline orientation, lateral weight shifting and postural control: On rockerboard in L/R direction: Standing w/EO and intermittent UE support x5 minutes using mirror for visual biofeedback on body position. Noted significant lateral lean to L side, mod tactile cues provided to pelvis and anterior shoulder to correct lateral trunk lean to L side, which pt unable to maintain. Pt able to see herself leaning to L, but unable to correct without assistance.  PWR Up (provided by [facility] certified therapist) x12 reps. Min A throughout to correct L lateral trunk lean and LOB to L side. Mod verbal cues for open hands and increased range of arm abduction throughout.  Standing w/ EC and intermittent UE support, 4x15s. Pt unable to perform without LOB to L side, facilitated by trunk lean. Attempted to facilitate lateral weight shift to R and provide pt w/tactile and visual cues for midline orientation, but pt unable to maintain.  Ther Ex  SciFit multi-peaks level 5 for 8  minutes using BUE/BLEs for neural priming for reciprocal movement, dynamic cardiovascular conditioning and increased amplitude of stepping. RPE of 5/10 following activity     PATIENT EDUCATION: Education details: Continue HEP Person educated: Patient and daughter  Education method: Explanation, Demonstration, and Verbal cues Education comprehension: verbalized understanding, returned demonstration, and needs further education  HOME EXERCISE PROGRAM: From previous bout of therapy: Will review/updated as needed.   Standing and seated PWR, Pt has in her exercise folder.     Access Code: [email or web address] URL: [email or web address] Date: 10/19/2021 Prepared by: [name]   Reviewed/condensed exercises for balance:    Exercises - Alternating Step Backward with Support  - 1 x daily - 5 x weekly - 1-2 sets - 10 reps - Romberg Stance Eyes Closed on Foam Pad  - 1 x daily - 5 x weekly - 3 sets - 30 hold   Plus standing tall against the wall for posture - 2 x 30 second holds    GOALS: Goals reviewed with patient? Yes  SHORT TERM GOALS: ALL STGS = LTGS   LONG TERM GOALS: Target date: 08/11/2022  Pt will be independent with final PD HEP for improved strength, balance, transfers, and gait  Baseline:  Goal status: INITIAL  2.  Pt will improve miniBEST to at least a 22/28 in order to demo decr fall risk Baseline: 19/28 Goal status: INITIAL  3.  Pt will improve gait speed with no AD vs. LRAD to at least 2.7 ft/sec in order to demo improved community mobility.   Baseline: 14.63 seconds with no AD = 2.24 ft/sec  Goal status: INITIAL  4.  Pt will improve manual TUG time to 14.5 seconds or less in order to demo decrease fall risk Baseline: 16.63 seconds  Goal status: INITIAL  5.  Pt will improve 5x sit<>stand to less than or equal to 14 sec to demonstrate improved functional strength and transfer efficiency. Baseline: 15.9 seconds with no UE support  Goal status:  INITIAL  6.  Pt will verbalize and demonstrate understanding of fall prevention in the home/community (use of appropriate AD, backing up into chairs, freezing strategies) Baseline:  Goal status: INITIAL  ASSESSMENT:  CLINICAL IMPRESSION: Session limited as pt arrived late. Emphasis of skilled PT session on postural control, midline orientation and lateral weight shifting. Pt continues to demonstrate significant truncal lean to L side that she is unaware of and unable to correct without cues. Pt did not respond to use of visual or tactile cues on rockerboard today and repeatedly leaned on L side. Pt did order rollator and should have by next Tuesday. Continue POC.   OBJECTIVE IMPAIRMENTS: Abnormal gait, decreased activity tolerance, decreased balance, decreased cognition, decreased coordination, decreased knowledge of use of DME, decreased mobility, difficulty walking, decreased strength, decreased safety awareness, impaired flexibility, impaired tone, and postural dysfunction.   ACTIVITY LIMITATIONS: carrying, lifting, bending, stairs, transfers, dressing, and locomotion level  PARTICIPATION LIMITATIONS: meal prep, cleaning, driving, shopping, and community activity  PERSONAL FACTORS: Age, Behavior pattern, Past/current experiences, Time since onset of injury/illness/exacerbation, and 3+ comorbidities: PD, glaucoma, osteoporosis (newly diagnosed), TB (as a child)    are also affecting patient's functional outcome.   REHAB POTENTIAL: Good  CLINICAL DECISION MAKING: Evolving/moderate complexity  EVALUATION COMPLEXITY: Moderate  PLAN:  PT FREQUENCY: 2x/week  PT DURATION: 8 weeks (anticipate maybe needing more than 4 weeks when pt initially got scheduled)  PLANNED INTERVENTIONS: Therapeutic exercises, Therapeutic activity, Neuromuscular re-education, Balance training, Gait training, Patient/Family education, Self Care, Stair training, Vestibular training, Visual/preceptual  remediation/compensation, DME instructions, Manual therapy, and Re-evaluation  PLAN FOR NEXT SESSION: Continue to work on gait with cane and rollator - trying obstacles. Review HEP and update as needed. Work on turning all the way before sitting down to a chair/getting up from low surfaces. Posture awareness as pt with heavy lean to L. Lateral stepping strategies/weight shifting   Cruzita Lederer Macrina Lehnert, PT, DPT  07/28/2022, 1:19 PM

## 2022-08-02 ENCOUNTER — Encounter: Payer: Self-pay | Admitting: Nurse Practitioner

## 2022-08-02 ENCOUNTER — Ambulatory Visit: Payer: Medicare Other | Admitting: Occupational Therapy

## 2022-08-02 ENCOUNTER — Ambulatory Visit: Payer: Medicare Other

## 2022-08-02 ENCOUNTER — Ambulatory Visit: Payer: Medicare Other | Admitting: Physical Therapy

## 2022-08-02 NOTE — Therapy (Deleted)
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   Past Surgical History:  Procedure Laterality Date   TUBAL LIGATION     Patient Active Problem List   Diagnosis Date Noted   Bradycardia 01/17/2022   Parkinson's disease 02/17/2020   Gait abnormality 12/04/2019   Prediabetes 12/03/2018   Vitamin D deficiency 12/03/2018   Anemia due to vitamin B12 deficiency 12/03/2018   Fatigue 12/03/2018   Urinary frequency 10/01/2018   Vitamin B12 deficiency 10/01/2018   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  Rationale for Evaluation and Treatment: Rehabilitation  SUBJECTIVE:   SUBJECTIVE STATEMENT: "***  PERTINENT HISTORY: PD, glaucoma, osteoporosis (newly diagnosed), TB (as a child)    PAIN: Are you having pain? No  PATIENT GOALS: improve confidence while speaking  OBJECTIVE:   TODAY'S TREATMENT:                                                                                                                                         08-02-22:  07-28-22: Pt accompanied by family members. Averaged 63 dB at start of session. SLP led pt through Speak Out! Lesson 9 and discussed the importance of HEP to warm up her voice. Provided model, pt demonstrated significant improvement in volume at start of warm up. Provided re-education concerning importance of intent. Pt  brought notes from a panel she plans to speak out on Saturday. Clinician utilized presented notes to implement Speak Out! Strategies. Pt required consistent fading to intermittent verbal and visual cues to increase volume during conversational task. SLP encouraged pt to implement her own visual cues to improve speech volume. At start and end of session, SLP requested pt repeat list of items she needs to remember when she leaves the house. Provided education on using visual aids at home to aid recall of pertinent items.   Ah average: 88 dB Conversation average: 68 dB    07-26-22: SLP provided education on potential metrics which may aid in improved carryover of intentional speech and increased vocal intensity. Encouraged pt to think about perceived effort level, reading facial expression of conversational partner, noting when being ask to repeat, making self-inquiry as to whether pt is channeling her "preacher" voice. Education on for improved communication efficacy, pt  should not rely on using her "normal" voice. For improved volume, she needs to avoid what feels normal and speak with more effort/volume. Generated visual cue for hand to ear to aid in cueing from daughter when she does not want to interrupt in middle of speech for verbal cue. Pt with her questions for panel, SLP provided aid for generating key points Marinn wants to relay in answering questions on panel. SLP asked pt to provided brief introduction to practice- pt with reduced intensity (64 dB) and usual extended pauses. SLP encourages pt to write key points and practice to mitigate complexities of public speaking task.   07-19-22: Generated strategy and visual aid to aid recall of necessary items for departure. With structured repetitive practice, pt able to recall 5 needed items with mod I by end of session. Provided instructions and visual aid to implement at home. Pt continues with online Speak Out! Lessons daily. Conversational volume  initially averaged mid 60s dB.Targeted improving vocal quality and increasing intensity through progressively difficulty speech tasks using Speak Out! program, lesson 1 review. ST leads pt through exercises providing occasional model prior to pt execution. occasional min-A required to achieve target dB this date. Averages this date: loud "ah" 86 dB; reading 77 dB; cognitive speech task 73 dB. Conversational sample of approx 5 minutes, pt averages low 70s dB with occasional min-A.  PATIENT EDUCATION: Education details: see above Person educated: Patient and Child(ren) Education method: Customer service manager Education comprehension: verbalized understanding, returned demonstration, and needs further education  HOME EXERCISE PROGRAM: Speak Out! Program    GOALS: Goals reviewed with patient? Yes  SHORT TERM GOALS: Target date: 08/10/2022  Pt will achieve targeted dB levels in demonstration of Speak Out! Lessons with 90% accuracy given rare min A over 2 sessions  Baseline: Goal status: IN PROGRESS  2.  Pt will utilize dysarthria compensations in 5-10 minute conversation and average WNL conversational volume (70-72 dB) given occasional min A over 2 sessions   Baseline:  Goal status: IN PROGRESS  3.  Pt and family will carryover memory compensations to aid daily functioning at home (recalling needed items/daily tasks) given occasional min A over 2 sessions  Baseline: 07-28-22 Goal status: IN PROGRESS  4.  Pt will carryover dysarthria strategies in public speaking role play to be 100% intelligible given occasional min A Baseline:  Goal status: IN PROGRESS  LONG TERM GOALS: Target date: 09/07/2022  Pt will utilize dysarthria compensations in 15+ minute conversation to optimize vocal intensity and clarity given rare min A over 2 sessions Baseline:  Goal status: IN PROGRESS  2.  Pt and family will carryover memory compensations to aid daily functioning at home (recalling needed  items/daily tasks) with mod I  Baseline:  Goal status: IN PROGRESS  3.  Pt will subjectively report increased confidence and attend increased number of events per family baseline  Baseline:  Goal status: IN PROGRESS   ASSESSMENT:  CLINICAL IMPRESSION: Patient is a 72 y.o. Female who was seen today for progression of Parkinson's Disease. Today, pt presents with mild hypokinetic dysarthria with initial conversational volume averaging mid 60s dB. Following re-education of principle of intent and Speak Out! program, pt averaged upper 60 dB speech volume. Education and instruction of memory compensations continued today. Pt would benefit from skilled ST intervention to optimize confidence and communication effectiveness as well as maximize cognitive functioning with cognitive compensation training.   OBJECTIVE IMPAIRMENTS: Objective impairments include memory and dysarthria. These impairments are limiting patient from  household responsibilities and effectively communicating at home and in community.Factors affecting potential to achieve goals and functional outcome are medical prognosis.. Patient will benefit from skilled SLP services to address above impairments and improve overall function.  REHAB POTENTIAL: Good  PLAN:  SLP FREQUENCY: 2x/week  SLP DURATION: 8 weeks  PLANNED INTERVENTIONS: Language facilitation, Environmental controls, Cueing hierachy, Cognitive reorganization, Internal/external aids, Functional tasks, Multimodal communication approach, SLP instruction and feedback, Compensatory strategies, and Patient/family education    Marzetta Board, CCC-SLP 08/02/2022, 9:38 AM

## 2022-08-03 NOTE — Therapy (Signed)
OUTPATIENT OCCUPATIONAL THERAPY [facility]  Patient Name: Denise [name] MRN: [id number] DOB:June 25, 1951, 72 y.o., female Today's Date: 08/03/2022  PCP: Minette Brine, FNP REFERRING PROVIDER: Dr. 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   Past Surgical History:  Procedure Laterality Date   TUBAL LIGATION     Patient Active Problem List   Diagnosis Date Noted   Bradycardia 01/17/2022   Parkinson's disease 02/17/2020   Gait abnormality 12/04/2019   Prediabetes 12/03/2018   Vitamin D deficiency 12/03/2018   Anemia due to vitamin B12 deficiency 12/03/2018   Fatigue 12/03/2018   Urinary frequency 10/01/2018   Vitamin B12 deficiency 10/01/2018   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  Rationale for Evaluation and Treatment: Rehabilitation  SUBJECTIVE:   SUBJECTIVE STATEMENT: *** Pt reports that she has not done ball exercises at home.   Pt accompanied by:  dtr  PERTINENT HISTORY: Parkinson's Disease, last seen by OT 12/2021 (recommended 6 month re-evaluation at OT d/c)  PMH:  Osteopenia, glaucoma, prediabetes, Vitamin D deficiency, anemia due to B12 deficiency, bradycardia    PRECAUTIONS: Fall  WEIGHT BEARING RESTRICTIONS: No  PAIN:  Are you having pain? No  FALLS: Has patient fallen in last 6 months? Yes. Number of falls 2  at granddaughter's dance practice  LIVING ENVIRONMENT: Lives with: lives with their family and lives with their daughter Lives in: House/apartment Stairs: No Has following equipment at home: Grab bars  PLOF:  Independent, no longer driving  PATIENT GOALS:improve balance, coordination   OBJECTIVE:   HAND DOMINANCE: Right  ADLs: Overall ADLs: difficulty sequencing at times, uses checklist prior to leaving house Transfers/ambulation related to ADLs: Eating: difficulty cutting food with fork/knife, some difficulty opening bottles/packages Grooming: mod I UB Dressing: difficulty getting shirts over head, difficulty putting earrings in at times LB Dressing: slide in shoes most of the time (can tie) Toileting: some difficulty with sitting down on toilet, some urgency, forgetting to flush Bathing: Washes at the sink in standing   Tub Shower transfers: washes at the sink   IADLs: Light housekeeping: assists daughter  Meal Prep: assists daughter with tasks Community mobility: no longer drives Medication management: dtr supervises/assists Handwriting:  illegible at time per pt  MOBILITY STATUS: Independent and Freezing  POSTURE COMMENTS:  rounded shoulders, forward head, and weight shift left  FUNCTIONAL OUTCOME MEASURES: Physical performance test: PPT#2 (simulated eating) Not tested & PPT#4 (donning/doffing jacket): 24sec  COORDINATION: 9 Hole Peg test: Right: 30.53 sec; Left: 47 sec Box and Blocks:  Right 43blocks, Left 42blocks bradykinesia noted LUE  UE ROM:  BUEs grossly  WFL (approx 140* shoulder flex with elbow ext bilaterally)  UE MMT:   Not tested  SENSATION: Not tested  MUSCLE TONE: RUE: Within functional limits and LUE: Within functional limits  COGNITION: Overall cognitive status: Impaired  Pt with impaired memory, decr awareness of postural changes, bradyphrenia, and difficulty sequencing at times per dtr  OBSERVATIONS: Bradykinesia, flat affect, decr eye contact and head turns   TODAY'S TREATMENT:                                                                                                                                ***  Pt/family instructed in updated  HEP--see pt instructions   Writing 8 sentences with good size/legibility.  Reviewed writing strategies.   PATIENT EDUCATION: Education details: *** HEP (basic PWR! Hands, coordination, PWR! Up in sitting, seated shoulder flex and diagonals with ball) Person educated: Patient and Child(ren) Education method: Explanation, Demonstration, Tactile cues, Verbal cues, and Handouts Education comprehension: verbalized understanding, returned demonstration, verbal cues required, and tactile cues required  HOME EXERCISE PROGRAM: Not yet instructed.  GOALS: Potential Goals reviewed with patient? Yes  SHORT TERM GOALS: Target date: 08/25/22  Pt will be independent with updated HEP. Goal status: INITIAL  2.  Pt will report incr ease with donning pull-over shirt without assist consistently. Goal status: INITIAL  3.  Pt will verbalize understanding of ways to prevent future PD related complications and appropriate community resources. Goal status: INITIAL  4.  Pt will be able to write at least 2 sentences with good legibility and size. Goal status: MET 07/28/22   LONG TERM GOALS: Target date: 09/23/22  Pt/caregiver will verbalize understanding of adaptive strategies/AE to incr safety and ease with ADLs/IADLs. Goal status: INITIAL  2.  Pt will don/doff jacket in less than 20sec for incr ease using strategies prn. Baseline:  24sec Goal status: INITIAL  3.  Pt will improve L hand coordination for ADLs/IADLs as shown by decr time on 9-hole peg test by at least 8sec.  Baseline:  47sec Goal status: INITIAL  4.  Pt will improve functional reaching/coordination as shown by improving score on box and blocks test by at least 4 bilaterally. Baseline: R-43, L-42 Goal status: INITIAL  5.  Pt/dtr will verbalize understanding of cognitive compensation strategies for ADLs/IADLs. Goal status: INITIAL   ASSESSMENT:  CLINICAL IMPRESSION: *** Pt is progressing towards goals with good understanding  of HEP.  Pt needed min cueing for large amplitude movements and utilized mirror for posture.   PERFORMANCE DEFICITS: in functional skills including ADLs, IADLs, coordination, dexterity, Fine motor control, mobility, balance, decreased knowledge of use of DME, and UE functional use, cognitive skills including memory, safety awareness, and sequencing, and psychosocial skills including environmental adaptation and routines and behaviors.   IMPAIRMENTS: are limiting patient from ADLs, IADLs, and leisure.   COMORBIDITIES:  may have co-morbidities  that affects occupational performance. Patient will benefit from skilled OT to address above impairments and improve overall function.  MODIFICATION OR ASSISTANCE TO COMPLETE EVALUATION: Min-Moderate modification of tasks or assist with assess necessary to complete an evaluation.  OT OCCUPATIONAL PROFILE AND HISTORY: Detailed assessment: Review of records and additional review of physical, cognitive, psychosocial history related to current functional performance.  CLINICAL DECISION MAKING: Moderate - several treatment options, min-mod task modification necessary  REHAB POTENTIAL: Good  EVALUATION COMPLEXITY: Moderate   PLAN:  OT FREQUENCY: 2x/week  OT DURATION: 8 weeks +eval  PLANNED INTERVENTIONS: self care/ADL training, therapeutic exercise, therapeutic activity, neuromuscular re-education, manual therapy, passive range of motion, balance training, functional mobility training, splinting, paraffin, fluidotherapy, moist heat, cryotherapy, patient/family education, cognitive remediation/compensation, and DME and/or AE instructions  RECOMMENDED OTHER SERVICES: current with PT, ST  CONSULTED AND AGREED WITH PLAN OF CARE: Patient and family Midwife (dtr)  PLAN FOR NEXT SESSION:  *** review strategies for ADLs   Makina Skow, OTR/L 08/03/2022, 5:26 PM

## 2022-08-04 ENCOUNTER — Ambulatory Visit: Payer: Medicare Other | Admitting: Occupational Therapy

## 2022-08-04 ENCOUNTER — Ambulatory Visit: Payer: Medicare Other | Attending: Nurse Practitioner

## 2022-08-04 ENCOUNTER — Ambulatory Visit: Payer: Medicare Other

## 2022-08-04 ENCOUNTER — Encounter: Payer: Self-pay | Admitting: Occupational Therapy

## 2022-08-04 DIAGNOSIS — M25612 Stiffness of left shoulder, not elsewhere classified: Secondary | ICD-10-CM | POA: Insufficient documentation

## 2022-08-04 DIAGNOSIS — R2681 Unsteadiness on feet: Secondary | ICD-10-CM

## 2022-08-04 DIAGNOSIS — R41841 Cognitive communication deficit: Secondary | ICD-10-CM | POA: Insufficient documentation

## 2022-08-04 DIAGNOSIS — R278 Other lack of coordination: Secondary | ICD-10-CM

## 2022-08-04 DIAGNOSIS — M6281 Muscle weakness (generalized): Secondary | ICD-10-CM | POA: Insufficient documentation

## 2022-08-04 DIAGNOSIS — R29818 Other symptoms and signs involving the nervous system: Secondary | ICD-10-CM | POA: Insufficient documentation

## 2022-08-04 DIAGNOSIS — R29898 Other symptoms and signs involving the musculoskeletal system: Secondary | ICD-10-CM

## 2022-08-04 DIAGNOSIS — R293 Abnormal posture: Secondary | ICD-10-CM | POA: Diagnosis not present

## 2022-08-04 DIAGNOSIS — R2689 Other abnormalities of gait and mobility: Secondary | ICD-10-CM | POA: Diagnosis not present

## 2022-08-04 DIAGNOSIS — R471 Dysarthria and anarthria: Secondary | ICD-10-CM | POA: Diagnosis not present

## 2022-08-04 NOTE — Therapy (Signed)
OUTPATIENT SPEECH LANGUAGE PATHOLOGY [facility]   Patient Name: Denise [name] MRN: [id number] DOB:1950-09-04, 72 y.o., female Today's Date: 08/04/2022  PCP: Minette Brine FNP REFERRING PROVIDER: Thenganatt   END OF SESSION:  End of Session - 08/04/22 1235     Visit Number 5    Number of Visits 17    Date for SLP Re-Evaluation 09/07/22    Authorization Type [employer]    SLP Start Time 1238   arrived late   SLP Stop Time  1315    SLP Time Calculation (min) 37 min    Activity Tolerance Patient tolerated treatment well              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   Past Surgical History:  Procedure Laterality Date   TUBAL LIGATION     Patient Active Problem List   Diagnosis Date Noted   Bradycardia 01/17/2022   Parkinson's disease 02/17/2020   Gait abnormality 12/04/2019   Prediabetes 12/03/2018   Vitamin D deficiency 12/03/2018   Anemia due to vitamin B12 deficiency 12/03/2018   Fatigue 12/03/2018   Urinary frequency 10/01/2018   Vitamin B12 deficiency 10/01/2018   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 No  PATIENT GOALS: improve confidence while speaking  OBJECTIVE:   TODAY'S TREATMENT:                                                                                                                                          08-04-22: Opening conversation averaged mid 60s dB. Optimized patient volume and intensity throughout Speak Out! Program. Rare min A required to achieve targeted dB levels. Conversation of recent event averaged 70 dB with occasional min A to maintain targeted volume. Plan to target conversational loudness outside of ST room in upcoming ST sessions. Good carryover of recommended memory compensations reported (checklists and schedules) with benefit reported by patient and family.   07-28-22: Pt accompanied by family members. Averaged 63 dB at start of session. SLP led pt through Speak Out! Lesson 9 and discussed the importance of HEP to warm up her voice. Provided model, pt demonstrated significant improvement in volume at start of warm up. Provided re-education concerning importance of intent.  Pt brought notes from a panel she plans to speak out on Saturday. Clinician utilized presented notes to implement Speak Out! Strategies. Pt required consistent fading to intermittent verbal and visual cues to increase volume during conversational task. SLP encouraged pt to implement her own visual cues to improve speech volume. At start and end of session, SLP requested pt repeat list of items she needs to remember when she leaves the house. Provided education on using visual aids at home to aid recall of pertinent items.   Ah average: 88 dB Conversation average: 68 dB    07-26-22: SLP provided education on potential metrics which may aid in improved carryover of intentional speech and increased vocal intensity. Encouraged pt to think about perceived effort level, reading facial expression of conversational partner, noting when being ask to repeat, making self-inquiry as to whether pt is channeling her "preacher" voice. Education on for improved communication efficacy, pt should not rely on using her "normal" voice. For improved volume, she needs to avoid what feels normal and speak with  more effort/volume. Generated visual cue for hand to ear to aid in cueing from daughter when she does not want to interrupt in middle of speech for verbal cue. Pt with her questions for panel, SLP provided aid for generating key points Mikylah wants to relay in answering questions on panel. SLP asked pt to provided brief introduction to practice- pt with reduced intensity (64 dB) and usual extended pauses. SLP encourages pt to write key points and practice to mitigate complexities of public speaking task.   07-19-22: Generated strategy and visual aid to aid recall of necessary items for departure. With structured repetitive practice, pt able to recall 5 needed items with mod I by end of session. Provided instructions and visual aid to implement at home. Pt continues with online Speak Out! Lessons daily. Conversational volume initially averaged mid 60s dB.Targeted improving vocal quality and increasing intensity through progressively difficulty speech tasks using Speak Out! program, lesson 1 review. ST leads pt through exercises providing occasional model prior to pt execution. occasional min-A required to achieve target dB this date. Averages this date: loud "ah" 86 dB; reading 77 dB; cognitive speech task 73 dB. Conversational sample of approx 5 minutes, pt averages low 70s dB with occasional min-A.  PATIENT EDUCATION: Education details: see above Person educated: Patient and Child(ren) Education method: Customer service manager Education comprehension: verbalized understanding, returned demonstration, and needs further education  HOME EXERCISE PROGRAM: Speak Out! Program    GOALS: Goals reviewed with patient? Yes  SHORT TERM GOALS: Target date: 08/10/2022  Pt will achieve targeted dB levels in demonstration of Speak Out! Lessons with 90% accuracy given rare min A over 2 sessions  Baseline: 08/04/22 Goal status: IN PROGRESS  2.  Pt will utilize dysarthria compensations in 5-10 minute  conversation and average WNL conversational volume (70-72 dB) given occasional min A over 2 sessions   Baseline: 08/04/22 Goal status: IN PROGRESS  3.  Pt and family will carryover memory compensations to aid daily functioning at home (recalling needed items/daily tasks) given occasional min A over 2 sessions  Baseline: 07-28-22, 08/04/22 Goal status: MET  4.  Pt will carryover dysarthria strategies in public speaking role play to be 100% intelligible given occasional min A Baseline:  Goal status: MET  LONG TERM GOALS: Target date: 09/07/2022  Pt will utilize dysarthria compensations in 15+ minute conversation to optimize vocal intensity and clarity given rare min A over 2 sessions Baseline:  Goal  status: IN PROGRESS  2.  Pt and family will carryover memory compensations to aid daily functioning at home (recalling needed items/daily tasks) with mod I  Baseline:  Goal status: IN PROGRESS  3.  Pt will subjectively report increased confidence and attend increased number of events per family baseline  Baseline:  Goal status: IN PROGRESS   ASSESSMENT:  CLINICAL IMPRESSION: Patient is a 72 y.o. Female who was seen today for progression of Parkinson's Disease. Today, pt presents with mild hypokinetic dysarthria with initial conversational volume averaging mid 60s dB. Following re-education of principle of intent and Speak Out! program, pt averaged low 70s dB. Education and instruction of memory compensations continued today. Pt would benefit from skilled ST intervention to optimize confidence and communication effectiveness as well as maximize cognitive functioning with cognitive compensation training.   OBJECTIVE IMPAIRMENTS: Objective impairments include memory and dysarthria. These impairments are limiting patient from household responsibilities and effectively communicating at home and in community.Factors affecting potential to achieve goals and functional outcome are medical prognosis..  Patient will benefit from skilled SLP services to address above impairments and improve overall function.  REHAB POTENTIAL: Good  PLAN:  SLP FREQUENCY: 2x/week  SLP DURATION: 8 weeks  PLANNED INTERVENTIONS: Language facilitation, Environmental controls, Cueing hierachy, Cognitive reorganization, Internal/external aids, Functional tasks, Multimodal communication approach, SLP instruction and feedback, Compensatory strategies, and Patient/family education    Marzetta Board, CCC-SLP 08/04/2022, 3:14 PM

## 2022-08-04 NOTE — Therapy (Signed)
OUTPATIENT PHYSICAL THERAPY NEURO TREATMENT   Patient Name: Denise [name] MRN: [id number] DOB:May 27, 1951, 72 y.o., female Today's Date: 08/04/2022   PCP: Minette Brine, FNP  REFERRING PROVIDER: Buck Mam Audrea Muscat, MD    END OF SESSION:  PT End of Session - 08/04/22 1543     Visit Number 5    Number of Visits 17    Date for PT Re-Evaluation 09/12/22    Authorization Type BC[employer]    PT Start Time 1230    PT Stop Time 1315    PT Time Calculation (min) 45 min    Equipment Utilized During Treatment Gait belt    Activity Tolerance Patient tolerated treatment well    Behavior During Therapy WFL for tasks assessed/performed                 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   Past Surgical History:  Procedure Laterality Date   TUBAL LIGATION     Patient Active Problem List   Diagnosis Date Noted   Bradycardia 01/17/2022   Parkinson's disease 02/17/2020   Gait abnormality 12/04/2019   Prediabetes 12/03/2018   Vitamin D deficiency 12/03/2018   Anemia due to vitamin B12 deficiency 12/03/2018   Fatigue 12/03/2018   Urinary frequency 10/01/2018   Vitamin B12 deficiency 10/01/2018   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 Pt present with daughter. Pt has new walker that she has been using since Monday.   Pt accompanied by: family member daughter   PERTINENT HISTORY: PD, glaucoma, osteoporosis (newly diagnosed), TB (as a child)    PAIN:  Are you having pain? No  PRECAUTIONS: Fall and Other: Osteoporosis  WEIGHT BEARING RESTRICTIONS: No  FALLS: Has patient fallen in last 6 months? Yes. Number of falls 2  LIVING ENVIRONMENT: Lives with: lives with  their family - daughter and granddaughter  Lives in: House/apartment Stairs: Yes: External: 2 steps; none Has following equipment at home: Single point cane and Grab bars  PLOF: Independent, pt's daughter reports sometimes pt needs help with dressing   PATIENT GOALS: See how she was since the last time she was here. Work on the balance   OBJECTIVE:   TODAY'S TREATMENT:      Gait training: 1 x 230' with rollator, cues for taking longer steps  Sit to stand transfers: 5x during the session, cues for one hand on walker and one hand on mat when performing sit to stand.  Gait training: 1 x 230' with st. Cane, same cues for longer steps, pt educated on looking down at her feet to make sure she is clearing contralateral foot with long enough step. - walking forward and backward with st. Point cane: 3 x 10 feet - walking forward and backward with black sport cord: 3 x 10 feet Gait training: 1 x 230' with st. Point cane- noted improved cadence and step length after resisted walking.     PATIENT EDUCATION: Education details: Continue HEP Person educated: Patient and daughter  Education method: Explanation, Demonstration, and Verbal cues Education comprehension: verbalized understanding, returned demonstration, and needs further education  HOME EXERCISE PROGRAM: From previous bout of therapy: Will review/updated as needed.   Standing and seated PWR, Pt has in her exercise folder.     Access Code: [email or web address] URL:  [email or web address] Date: 10/19/2021 Prepared by: [name]   Reviewed/condensed exercises for balance:    Exercises - Alternating Step Backward with Support  - 1 x daily - 5 x weekly - 1-2 sets - 10 reps - Romberg Stance Eyes Closed on Foam Pad  - 1 x daily - 5 x weekly - 3 sets - 30 hold   Plus standing tall against the wall for posture - 2 x 30 second holds    GOALS: Goals reviewed with patient? Yes  SHORT TERM GOALS: ALL STGS = LTGS   LONG TERM GOALS: Target date: 08/11/2022  Pt will be independent with final PD HEP for improved strength, balance, transfers, and gait  Baseline:  Goal status: INITIAL  2.  Pt will improve miniBEST to at least a 22/28 in order to demo decr fall risk Baseline: 19/28 Goal status: INITIAL  3.  Pt will improve gait speed with no AD vs. LRAD to at least 2.7 ft/sec in order to demo improved community mobility.   Baseline: 14.63 seconds with no AD = 2.24 ft/sec  Goal status: INITIAL  4.  Pt will improve manual TUG time to 14.5 seconds or less in order to demo decrease fall risk Baseline: 16.63 seconds  Goal status: INITIAL  5.  Pt will improve 5x sit<>stand to less than or equal to 14 sec to demonstrate improved functional strength and transfer efficiency. Baseline: 15.9 seconds with no UE support  Goal status: INITIAL  6.  Pt will verbalize and demonstrate understanding of fall prevention in the home/community (use of appropriate AD, backing up into chairs, freezing strategies) Baseline:  Goal status: INITIAL  ASSESSMENT:  CLINICAL IMPRESSION: Session focused on gait training with LRAD and working on improving step length and cadence. Pt did well with walking fwd and bwd and required SBA/CGA only. Pt's gait with st. Cane improved immediately after resisted walking with improved cadence and step length. Pt provided extensive education throughout the session to focus on working on step length with walking at  home.  OBJECTIVE IMPAIRMENTS: Abnormal gait, decreased activity tolerance, decreased balance, decreased cognition, decreased coordination, decreased knowledge of use of DME, decreased mobility, difficulty walking, decreased strength, decreased safety awareness, impaired flexibility, impaired tone, and postural dysfunction.   ACTIVITY LIMITATIONS: carrying, lifting, bending, stairs, transfers, dressing, and locomotion level  PARTICIPATION LIMITATIONS: meal prep, cleaning, driving, shopping, and community activity  PERSONAL FACTORS: Age, Behavior pattern, Past/current experiences, Time since onset of injury/illness/exacerbation, and 3+ comorbidities: PD, glaucoma, osteoporosis (newly diagnosed), TB (as a child)    are also affecting patient's functional outcome.   REHAB POTENTIAL: Good  CLINICAL DECISION MAKING: Evolving/moderate complexity  EVALUATION COMPLEXITY: Moderate  PLAN:  PT FREQUENCY: 2x/week  PT DURATION: 8 weeks (anticipate maybe needing more than 4 weeks when pt initially got scheduled)  PLANNED INTERVENTIONS: Therapeutic exercises, Therapeutic activity, Neuromuscular re-education, Balance training, Gait training, Patient/Family education, Self Care, Stair training, Vestibular training, Visual/preceptual remediation/compensation, DME instructions, Manual therapy, and Re-evaluation  PLAN FOR NEXT SESSION: Continue to work on gait with cane and rollator - trying obstacles. Review HEP and update as needed. Work on turning all the way before sitting down to a chair/getting up from low surfaces. Posture awareness as pt with heavy lean to L. Lateral stepping strategies/weight shifting   Kerrie Pleasure, PT, DPT  08/04/2022, 3:49 PM

## 2022-08-09 ENCOUNTER — Ambulatory Visit: Payer: Medicare Other | Admitting: Physical Therapy

## 2022-08-09 ENCOUNTER — Encounter: Payer: Self-pay | Admitting: Physical Therapy

## 2022-08-09 DIAGNOSIS — R41841 Cognitive communication deficit: Secondary | ICD-10-CM | POA: Diagnosis not present

## 2022-08-09 DIAGNOSIS — R293 Abnormal posture: Secondary | ICD-10-CM

## 2022-08-09 DIAGNOSIS — M25612 Stiffness of left shoulder, not elsewhere classified: Secondary | ICD-10-CM | POA: Diagnosis not present

## 2022-08-09 DIAGNOSIS — R2681 Unsteadiness on feet: Secondary | ICD-10-CM

## 2022-08-09 DIAGNOSIS — M6281 Muscle weakness (generalized): Secondary | ICD-10-CM

## 2022-08-09 DIAGNOSIS — R2689 Other abnormalities of gait and mobility: Secondary | ICD-10-CM | POA: Diagnosis not present

## 2022-08-09 DIAGNOSIS — R29898 Other symptoms and signs involving the musculoskeletal system: Secondary | ICD-10-CM

## 2022-08-09 DIAGNOSIS — R278 Other lack of coordination: Secondary | ICD-10-CM | POA: Diagnosis not present

## 2022-08-09 DIAGNOSIS — R29818 Other symptoms and signs involving the nervous system: Secondary | ICD-10-CM | POA: Diagnosis not present

## 2022-08-09 DIAGNOSIS — R471 Dysarthria and anarthria: Secondary | ICD-10-CM | POA: Diagnosis not present

## 2022-08-09 NOTE — Therapy (Signed)
OUTPATIENT PHYSICAL THERAPY NEURO TREATMENT   Patient Name: Denise [name] MRN: [id number] DOB:05/19/1951, 72 y.o., female Today's Date: 08/09/2022   PCP: Minette Brine, FNP  REFERRING PROVIDER: Buck Mam Audrea Muscat, MD    END OF SESSION:  PT End of Session - 08/09/22 1411     Visit Number 6    Number of Visits 17    Date for PT Re-Evaluation 09/12/22    Authorization Type BCBS    PT Start Time 1409   pt late to session   PT Stop Time 1445    PT Time Calculation (min) 36 min    Equipment Utilized During Treatment Gait belt    Activity Tolerance Patient tolerated treatment well    Behavior During Therapy WFL for tasks assessed/performed                 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   Past Surgical History:  Procedure Laterality Date   TUBAL LIGATION     Patient Active Problem List   Diagnosis Date Noted   Bradycardia 01/17/2022   Parkinson's disease 02/17/2020   Gait abnormality 12/04/2019   Prediabetes 12/03/2018   Vitamin D deficiency 12/03/2018   Anemia due to vitamin B12 deficiency 12/03/2018   Fatigue 12/03/2018   Urinary frequency 10/01/2018   Vitamin B12 deficiency 10/01/2018   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 Has brought her rollator to church and that has been going well. Rollator stays in the car for outings. Keeps her SPC in the house    Pt accompanied by: family member daughter   PERTINENT HISTORY: PD, glaucoma, osteoporosis (newly diagnosed), TB (as a child)    PAIN:  Are you having pain? No  PRECAUTIONS: Fall and Other: Osteoporosis  WEIGHT BEARING RESTRICTIONS: No  FALLS: Has patient fallen in last 6 months? Yes. Number of falls 2  LIVING ENVIRONMENT: Lives with: lives with their family - daughter and granddaughter  Lives in: House/apartment Stairs: Yes: External: 2 steps; none Has following equipment at home: Single point cane and Grab bars  PLOF: Independent, pt's daughter reports sometimes pt needs help with dressing   PATIENT GOALS: See how she was since the last time she was here. Work on the balance   OBJECTIVE:   TODAY'S TREATMENT:      Personnel officer  GAIT: Gait pattern: step through pattern, decreased arm swing- Right, decreased arm swing- Left, decreased step length- Right, decreased step length- Left, decreased stride length, decreased ankle dorsiflexion- Right, decreased ankle dorsiflexion- Left, Right foot flat, Left foot flat, shuffling, lateral lean- Left, decreased trunk rotation, and trunk flexed Distance walked: Clinic distances  Assistive device utilized: rollator   Level of assistance: SBA Comments: Worked on incr stride length with rollator, posture, and staying closer to rollator during gait. Pt ambulates with a more narrow BOS. Intermittent cues during session for proper brake management with sit <> stand transfers for safety.    Worked on doing tight turns and going in and out of 7 cones for obstacle negotiation, performed over approx' 15' x3 reps with cues for staying closer to rollator, posture and incr step length. Pt did better with incr step length on the 3rd rep    NMR:  Pt  performs PWR! Moves in Standing position with a chair in front of pt for balance prn. Also had mirror in front of pt as visual cue for midline orientation.    PWR! Up for improved posture x20 reps   PWR! Rock for improved weight shifting x20 reps, performed an additional x5 reps bilat with dynamic SLS with lifting contralateral leg, pt needing one episode of min A for balance   PWR! Twist for improved trunk rotation x10 reps   PWR! Step for improved step initiation x20 reps with use of 2" > 4" obstacle for improved foot clearance when stepping   Cues provided for larger amplitude movement patterns. Pt reporting effort level as a 7/10.    In front of mirror with use of blaze pod changing colors as visual target for upright posture/looking ahead, performed forward/retro walking with blue foam beam in between feet for wider BOS during gait. With first rep, pt taking smaller steps going forwards (approx. 12 forwards and 18 backwards). When cued for incr step length, pt able to improve step length forward to 8 and steps backwards to 12-14. Pt pleased with being able to improve her step length.     PATIENT EDUCATION: Education details: Continue HEP Person educated: Patient and daughter  Education method: Explanation, Demonstration, and Verbal cues Education comprehension: verbalized understanding, returned demonstration, and needs further education  HOME EXERCISE PROGRAM: From previous bout of therapy: Will review/updated as needed.   Standing and seated PWR, Pt has in her exercise folder.     Access Code: [email or web address] URL: [email or web address] Date: 10/19/2021 Prepared by: [name]   Reviewed/condensed exercises for balance:    Exercises - Alternating Step Backward with Support  - 1 x daily - 5 x weekly - 1-2 sets - 10 reps - Romberg Stance Eyes Closed on Foam Pad  - 1 x daily - 5 x weekly - 3 sets - 30 hold   Plus standing tall against the wall for posture - 2 x  30 second holds    GOALS: Goals  reviewed with patient? Yes  SHORT TERM GOALS: ALL STGS = LTGS   LONG TERM GOALS: Target date: 08/11/2022  Pt will be independent with final PD HEP for improved strength, balance, transfers, and gait  Baseline:  Goal status: INITIAL  2.  Pt will improve miniBEST to at least a 22/28 in order to demo decr fall risk Baseline: 19/28 Goal status: INITIAL  3.  Pt will improve gait speed with no AD vs. LRAD to at least 2.7 ft/sec in order to demo improved community mobility.   Baseline: 14.63 seconds with no AD = 2.24 ft/sec  Goal status: INITIAL  4.  Pt will improve manual TUG time to 14.5 seconds or less in order to demo decrease fall risk Baseline: 16.63 seconds  Goal status: INITIAL  5.  Pt will improve 5x sit<>stand to less than or equal to 14 sec to demonstrate improved functional strength and transfer efficiency. Baseline: 15.9 seconds with no UE support  Goal status: INITIAL  6.  Pt will verbalize and demonstrate understanding of fall prevention in the home/community (use of appropriate AD, backing up into chairs, freezing strategies) Baseline:  Goal status: INITIAL  ASSESSMENT:  CLINICAL IMPRESSION: Session limited today due to pt arriving late. Worked on [employer] moves for larger amplitude movement patterns and balance, gait training with rollator and obstacle negotiation, and forward/retro gait with wider BOS. Pt needing cues for incr stride length when navigating tighter obstacles, initially took pt incr time and pt had small, shuffled steps. With incr practice, pt able to improve step length. Will continue to progress towards LTGs.   OBJECTIVE IMPAIRMENTS: Abnormal gait, decreased activity tolerance, decreased balance, decreased cognition, decreased coordination, decreased knowledge of use of DME, decreased mobility, difficulty walking, decreased strength, decreased safety awareness, impaired flexibility, impaired tone, and postural dysfunction.    ACTIVITY LIMITATIONS: carrying, lifting, bending, stairs, transfers, dressing, and locomotion level  PARTICIPATION LIMITATIONS: meal prep, cleaning, driving, shopping, and community activity  PERSONAL FACTORS: Age, Behavior pattern, Past/current experiences, Time since onset of injury/illness/exacerbation, and 3+ comorbidities: PD, glaucoma, osteoporosis (newly diagnosed), TB (as a child)    are also affecting patient's functional outcome.   REHAB POTENTIAL: Good  CLINICAL DECISION MAKING: Evolving/moderate complexity  EVALUATION COMPLEXITY: Moderate  PLAN:  PT FREQUENCY: 2x/week  PT DURATION: 8 weeks (anticipate maybe needing more than 4 weeks when pt initially got scheduled)  PLANNED INTERVENTIONS: Therapeutic exercises, Therapeutic activity, Neuromuscular re-education, Balance training, Gait training, Patient/Family education, Self Care, Stair training, Vestibular training, Visual/preceptual remediation/compensation, DME instructions, Manual therapy, and Re-evaluation  PLAN FOR NEXT SESSION: check some LTGs and update goal date for 8 week POC   Continue to work on gait with cane and rollator - trying obstacles. Review HEP and update as needed. Work on turning all the way before sitting down to a chair/getting up from low surfaces. Posture awareness as pt with heavy lean to L. Lateral stepping strategies/weight shifting   Arliss Journey, PT, DPT  08/09/2022, 5:11 PM

## 2022-08-10 ENCOUNTER — Encounter: Payer: Self-pay | Admitting: Nurse Practitioner

## 2022-08-10 ENCOUNTER — Ambulatory Visit (INDEPENDENT_AMBULATORY_CARE_PROVIDER_SITE_OTHER): Payer: Medicare Other | Admitting: Nurse Practitioner

## 2022-08-10 ENCOUNTER — Ambulatory Visit
Admission: RE | Admit: 2022-08-10 | Discharge: 2022-08-10 | Disposition: A | Discharge status: Home / Self Care | Payer: Medicare Other | Source: Ambulatory Visit | Attending: Nurse Practitioner | Admitting: Nurse Practitioner

## 2022-08-10 VITALS — BP 122/72 | Temp 98.2°F | Ht 63.0 in | Wt 126.0 lb

## 2022-08-10 DIAGNOSIS — E559 Vitamin D deficiency, unspecified: Secondary | ICD-10-CM

## 2022-08-10 DIAGNOSIS — R197 Diarrhea, unspecified: Secondary | ICD-10-CM

## 2022-08-10 DIAGNOSIS — H47232 Glaucomatous optic atrophy, left eye: Secondary | ICD-10-CM

## 2022-08-10 DIAGNOSIS — R7303 Prediabetes: Secondary | ICD-10-CM | POA: Diagnosis not present

## 2022-08-10 DIAGNOSIS — Z0000 Encounter for general adult medical examination without abnormal findings: Secondary | ICD-10-CM

## 2022-08-10 DIAGNOSIS — Z0001 Encounter for general adult medical examination with abnormal findings: Secondary | ICD-10-CM

## 2022-08-10 DIAGNOSIS — Z87440 Personal history of urinary (tract) infections: Secondary | ICD-10-CM

## 2022-08-10 DIAGNOSIS — Z13220 Encounter for screening for lipoid disorders: Secondary | ICD-10-CM

## 2022-08-10 DIAGNOSIS — G20A1 Parkinson's disease without dyskinesia, without mention of fluctuations: Secondary | ICD-10-CM

## 2022-08-10 DIAGNOSIS — Z79899 Other long term (current) drug therapy: Secondary | ICD-10-CM

## 2022-08-10 DIAGNOSIS — Z1159 Encounter for screening for other viral diseases: Secondary | ICD-10-CM

## 2022-08-10 DIAGNOSIS — M810 Age-related osteoporosis without current pathological fracture: Secondary | ICD-10-CM

## 2022-08-10 DIAGNOSIS — D519 Vitamin B12 deficiency anemia, unspecified: Secondary | ICD-10-CM

## 2022-08-10 LAB — POC URINALSYSI DIPSTICK (AUTOMATED)
Bilirubin, UA: NEGATIVE
Blood, UA: NEGATIVE
Glucose, UA: NEGATIVE
Ketones, UA: NEGATIVE
Nitrite, UA: NEGATIVE
Protein, UA: NEGATIVE
Spec Grav, UA: 1.025 (ref 1.010–1.025)
Urobilinogen, UA: 0.2 E.U./dL
pH, UA: 6.5 (ref 5.0–8.0)

## 2022-08-10 NOTE — Progress Notes (Signed)
I,Verdis Bassette,acting as a Education administrator for Minette Brine, FNP.,have documented all relevant documentation on the behalf of Minette Brine, FNP,as directed by  Minette Brine, FNP while in the presence of Minette Brine, [place].   Subjective:     Patient ID: Denise [name] , female    DOB: 08-Jun-1951 , 72 y.o.   MRN: [id number]   Chief Complaint  Patient presents with   Annual Exam   bowel changes    More loose lately, does report some nausea, denies abd pain/emesis    HPI  Patient presents today for annual exam. Patient does report frequent loose stools, denies abdominal pain, blood or emesis, does have occasional nausea. She does not take a probiotic. She will have to run to the bathroom.       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     Family History  Problem Relation Age of Onset   Hypertension Mother    Heart disease Mother    Diabetes Father    Hypertension Father    Other Father        had a condition that was "a cousin" to ALS    Diabetes Sister    Tuberculosis Maternal Grandmother    Heart disease Sister    Colitis Daughter    Colon cancer Neg Hx    Colon polyps Neg Hx    Esophageal cancer Neg Hx    Rectal cancer Neg Hx    Stomach cancer Neg Hx      Current Outpatient Medications:    alendronate (FOSAMAX) 70 MG tablet, TAKE 1 TABLET (70 MG TOTAL) BY MOUTH EVERY 7 DAYS WITH FULL GLASS WATER ON EMPTY STOMACH, Disp: 12 tablet, Rfl: 1   atorvastatin (LIPITOR) 10 MG tablet, TAKE 1 TABLET BY MOUTH EVERY DAY AT BEDTIME MONDAY THROUGH FRIDAY, Disp: 65 tablet, Rfl: 4   Calcium Carb-Cholecalciferol (OYSTER SHELL CALCIUM/VITAMIN D) 500-200 MG-UNIT PACK, Take by mouth., Disp: , Rfl:    carbidopa-levodopa (SINEMET IR) 25-100 MG tablet, Take at 6am,12 and 6pm. Try to avoid protein. May take an extra during the day if needed., Disp: 360  tablet, Rfl: 4   dorzolamide-timolol (COSOPT) 2-0.5 % ophthalmic solution, Place 1 drop into the left eye 2 (two) times daily., Disp: , Rfl:    hydrochlorothiazide (HYDRODIURIL) 12.5 MG tablet, TAKE 1, Disp: 90 tablet, Rfl: 1   ibuprofen (ADVIL) 800 MG tablet, Take 800 mg by mouth every 8 (eight) hours as needed., Disp: , Rfl:    Travoprost, BAK Free, (TRAVATAN) 0.004 % SOLN ophthalmic solution, 1 drop at bedtime., Disp: , Rfl:    Vitamin D, Ergocalciferol, (DRISDOL) 1.25 MG (50000 UNIT) CAPS capsule, Take 1 capsule (50,000 Units total) by mouth every 7 (seven) days., Disp: 12 capsule, Rfl: 0   Allergies  Allergen Reactions   Nystatin-Triamcinolone Swelling    BLISTERS   Triamcinolone Acetonide     Blisters    Cephalexin Itching and Rash      The patient states she uses post menopausal status.  No LMP recorded. Patient is postmenopausal.. Negative for Dysmenorrhea and Negative for Menorrhagia. Negative for: breast discharge, breast lump(s), breast pain and breast self exam. Associated symptoms include abnormal vaginal bleeding. Pertinent negatives include abnormal bleeding (hematology), anxiety, decreased libido, depression, difficulty falling sleep, dyspareunia,  history of infertility, nocturia, sexual dysfunction, sleep disturbances, urinary incontinence, urinary urgency, vaginal discharge and vaginal itching. Diet regular. The patient states her exercise level is minimal - she is back in PT 2 times a week.   The patient's tobacco use is:  Social History   Tobacco Use  Smoking Status Never  Smokeless Tobacco Never   She has been exposed to passive smoke. The patient's alcohol use is:  Social History   Substance and Sexual Activity  Alcohol Use Never     Review of Systems  Constitutional: Negative.   HENT: Negative.    Eyes: Negative.   Respiratory: Negative.    Cardiovascular: Negative.   Gastrointestinal:  Positive for diarrhea and nausea. Negative for vomiting.  Endocrine:  Negative.   Genitourinary: Negative.   Musculoskeletal: Negative.   Skin: Negative.   Allergic/Immunologic: Negative.   Neurological: Negative.   Hematological: Negative.   Psychiatric/Behavioral: Negative.       Today's Vitals   08/10/22 1122  BP: 122/72  Temp: 98.2 F (36.8 C)  TempSrc: Oral  Weight: 126 lb (57.2 kg)  Height: 5' 3"$  (1.6 m)   Body mass index is 22.32 kg/m.   Objective:  Physical Exam Constitutional:      General: She is not in acute distress.    Appearance: Normal appearance. She is well-developed.     Comments: Thin appearance  HENT:     Head: Normocephalic and atraumatic.     Right Ear: Hearing, tympanic membrane, ear canal and external ear normal. There is no impacted cerumen.     Left Ear: Hearing, tympanic membrane, ear canal and external ear normal. There is no impacted cerumen.     Nose: Nose normal.     Mouth/Throat:     Mouth: Mucous membranes are moist.  Eyes:     General: Lids are normal.     Extraocular Movements: Extraocular movements intact.     Conjunctiva/sclera: Conjunctivae normal.     Pupils: Pupils are equal, round, and reactive to light.     Funduscopic exam:    Right eye: No papilledema.        Left eye: No papilledema.  Neck:     Thyroid: No thyroid mass.     Vascular: No carotid bruit.  Cardiovascular:     Rate and Rhythm: Normal rate and regular rhythm.     Pulses: Normal pulses.     Heart sounds: Normal heart sounds. No murmur heard. Pulmonary:     Effort: Pulmonary effort is normal. No respiratory distress.     Breath sounds: Normal breath sounds. No wheezing.  Chest:     Chest wall: No mass.  Breasts:    Tanner Score is 5.     Right: Normal. No mass or tenderness.     Left: Normal. No mass or tenderness.  Abdominal:     General: Abdomen is flat. Bowel sounds are normal. There is no distension.     Palpations: Abdomen is soft.     Tenderness: There is no abdominal tenderness.  Genitourinary:    Comments:  Deferred  Musculoskeletal:        General: No swelling or tenderness. Normal range of motion.     Cervical back: Full passive range of motion without pain, normal range of motion and neck supple.     Right lower leg: No edema.     Left lower leg: No edema.  Lymphadenopathy:     Upper Body:     Right upper body:  No supraclavicular, axillary or pectoral adenopathy.     Left upper body: No supraclavicular, axillary or pectoral adenopathy.  Skin:    General: Skin is warm and dry.     Capillary Refill: Capillary refill takes less than 2 seconds.  Neurological:     General: No focal deficit present.     Mental Status: She is alert and oriented to person, place, and time.     Cranial Nerves: No cranial nerve deficit.     Sensory: No sensory deficit.     Motor: No weakness.  Psychiatric:        Mood and Affect: Mood normal.        Behavior: Behavior normal.        Thought Content: Thought content normal.        Judgment: Judgment normal.     Comments: Flat facial appearance         Assessment And Plan:     1. Encounter for general adult medical examination w/o abnormal findings Behavior modifications discussed and diet history reviewed.   Pt will continue to exercise regularly and modify diet with low GI, plant based foods and decrease intake of processed foods.  Recommend intake of daily multivitamin, Vitamin D, and calcium.  Recommend mammogram and colonoscopy for preventive screenings, as well as recommend immunizations that include influenza, TDAP, and Shingles  2. Need for hepatitis C screening test Will check Hepatitis C screening due to recent recommendations to screen all adults 18 years and older  3. Screening for lipid disorders - Lipid panel  4. History of recurrent UTIs Comments: No significant abnormal findings with urinalysis - POCT Urinalysis Dipstick (Automated)  5. Prediabetes Comments: Stable, continue focusing on healthy diet and physical activity as  tolerated - Hemoglobin A1c - CMP14+EGFR  6. Vitamin D deficiency Will check vitamin D level and supplement as needed.    Also encouraged to spend 15 minutes in the sun daily.  - VITAMIN D 25 Hydroxy (Vit-D Deficiency, Fractures)  7. Anemia due to vitamin B12 deficiency, unspecified B12 deficiency type Comments: Will check levels - CBC - Vitamin B12; Future  8. Parkinson's disease without dyskinesia, unspecified whether manifestations fluctuate Comments: Continue f/u with Neurology and PT  9. Glaucomatous optic atrophy of left eye Comments: Continue f/u with Opthalmology  10. Age-related osteoporosis without current pathological fracture Comments: She is to continue fosamax, tolerating well. Continue with low impact walking  11. Diarrhea, unspecified type Comments: Will go for KUB to evaluate for constipation. Encouraged to take a probiotic daily. - DG Abd 1 View; Future  12. Other long term (current) drug therapy - CBC     Patient was given opportunity to ask questions. Patient verbalized understanding of the plan and was able to repeat key elements of the plan. All questions were answered to their satisfaction.   Minette Brine, FNP   I, Minette Brine, FNP, have reviewed all documentation for this visit. The documentation on 08/09/22 for the exam, diagnosis, procedures, and orders are all accurate and complete.   THE PATIENT IS ENCOURAGED TO PRACTICE SOCIAL DISTANCING DUE TO THE COVID-19 PANDEMIC.

## 2022-08-10 NOTE — Patient Instructions (Signed)
Health Maintenance After Age 71 After age 71, you are at a higher risk for certain long-term diseases and infections as well as injuries from falls. Falls are a major cause of broken bones and head injuries in people who are older than age 71. Getting regular preventive care can help to keep you healthy and well. Preventive care includes getting regular testing and making lifestyle changes as recommended by your health care provider. Talk with your health care provider about: Which screenings and tests you should have. A screening is a test that checks for a disease when you have no symptoms. A diet and exercise plan that is right for you. What should I know about screenings and tests to prevent falls? Screening and testing are the best ways to find a health problem early. Early diagnosis and treatment give you the best chance of managing medical conditions that are common after age 71. Certain conditions and lifestyle choices may make you more likely to have a fall. Your health care provider may recommend: Regular vision checks. Poor vision and conditions such as cataracts can make you more likely to have a fall. If you wear glasses, make sure to get your prescription updated if your vision changes. Medicine review. Work with your health care provider to regularly review all of the medicines you are taking, including over-the-counter medicines. Ask your health care provider about any side effects that may make you more likely to have a fall. Tell your health care provider if any medicines that you take make you feel dizzy or sleepy. Strength and balance checks. Your health care provider may recommend certain tests to check your strength and balance while standing, walking, or changing positions. Foot health exam. Foot pain and numbness, as well as not wearing proper footwear, can make you more likely to have a fall. Screenings, including: Osteoporosis screening. Osteoporosis is a condition that causes  the bones to get weaker and break more easily. Blood pressure screening. Blood pressure changes and medicines to control blood pressure can make you feel dizzy. Depression screening. You may be more likely to have a fall if you have a fear of falling, feel depressed, or feel unable to do activities that you used to do. Alcohol use screening. Using too much alcohol can affect your balance and may make you more likely to have a fall. Follow these instructions at home: Lifestyle Do not drink alcohol if: Your health care provider tells you not to drink. If you drink alcohol: Limit how much you have to: 0-1 drink a day for women. 0-2 drinks a day for men. Know how much alcohol is in your drink. In the [place] one drink equals one 12 oz bottle of beer (355 mL), one 5 oz glass of wine (148 mL), or one 1 oz glass of hard liquor (44 mL). Do not use any products that contain nicotine or tobacco. These products include cigarettes, chewing tobacco, and vaping devices, such as e-cigarettes. If you need help quitting, ask your health care provider. Activity  Follow a regular exercise program to stay fit. This will help you maintain your balance. Ask your health care provider what types of exercise are appropriate for you. If you need a cane or walker, use it as recommended by your health care provider. Wear supportive shoes that have nonskid soles. Safety  Remove any tripping hazards, such as rugs, cords, and clutter. Install safety equipment such as grab bars in bathrooms and safety rails on stairs. Keep rooms and walkways   well-lit. General instructions Talk with your health care provider about your risks for falling. Tell your health care provider if: You fall. Be sure to tell your health care provider about all falls, even ones that seem minor. You feel dizzy, tiredness (fatigue), or off-balance. Take over-the-counter and prescription medicines only as told by your health care provider. These include  supplements. Eat a healthy diet and maintain a healthy weight. A healthy diet includes low-fat dairy products, low-fat (lean) meats, and fiber from whole grains, beans, and lots of fruits and vegetables. Stay current with your vaccines. Schedule regular health, dental, and eye exams. Summary Having a healthy lifestyle and getting preventive care can help to protect your health and wellness after age 71. Screening and testing are the best way to find a health problem early and help you avoid having a fall. Early diagnosis and treatment give you the best chance for managing medical conditions that are more common for people who are older than age 71. Falls are a major cause of broken bones and head injuries in people who are older than age 71. Take precautions to prevent a fall at home. Work with your health care provider to learn what changes you can make to improve your health and wellness and to prevent falls. This information is not intended to replace advice given to you by your health care provider. Make sure you discuss any questions you have with your health care provider. Document Revised: 11/09/2020 Document Reviewed: 11/09/2020 Elsevier Patient Education  2023 [employer] Inc[employer]  

## 2022-08-11 ENCOUNTER — Ambulatory Visit: Payer: Medicare Other | Admitting: Speech Pathology

## 2022-08-11 ENCOUNTER — Ambulatory Visit: Payer: Medicare Other | Admitting: Physical Therapy

## 2022-08-11 DIAGNOSIS — M6281 Muscle weakness (generalized): Secondary | ICD-10-CM

## 2022-08-11 DIAGNOSIS — R2681 Unsteadiness on feet: Secondary | ICD-10-CM

## 2022-08-11 DIAGNOSIS — R29818 Other symptoms and signs involving the nervous system: Secondary | ICD-10-CM | POA: Diagnosis not present

## 2022-08-11 DIAGNOSIS — R471 Dysarthria and anarthria: Secondary | ICD-10-CM

## 2022-08-11 DIAGNOSIS — R2689 Other abnormalities of gait and mobility: Secondary | ICD-10-CM | POA: Diagnosis not present

## 2022-08-11 DIAGNOSIS — R41841 Cognitive communication deficit: Secondary | ICD-10-CM | POA: Diagnosis not present

## 2022-08-11 DIAGNOSIS — M25612 Stiffness of left shoulder, not elsewhere classified: Secondary | ICD-10-CM | POA: Diagnosis not present

## 2022-08-11 DIAGNOSIS — R29898 Other symptoms and signs involving the musculoskeletal system: Secondary | ICD-10-CM | POA: Diagnosis not present

## 2022-08-11 DIAGNOSIS — R278 Other lack of coordination: Secondary | ICD-10-CM | POA: Diagnosis not present

## 2022-08-11 DIAGNOSIS — R293 Abnormal posture: Secondary | ICD-10-CM | POA: Diagnosis not present

## 2022-08-11 NOTE — Therapy (Signed)
OUTPATIENT SPEECH LANGUAGE PATHOLOGY PARKINSON'S TREATMENT   Patient Name: Denise [name] MRN: [id number] DOB:08-Aug-1950, 72 y.o., female Today's Date: 08/11/2022  PCP: Minette Brine FNP REFERRING PROVIDER: Thenganatt   END OF SESSION:  End of Session - 08/11/22 1459     Visit Number 6    Number of Visits 17    Date for SLP Re-Evaluation 09/07/22    Authorization Type [employer]    SLP Start Time 1445    SLP Stop Time  1530    SLP Time Calculation (min) 45 min    Activity Tolerance Patient tolerated treatment well              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   Past Surgical History:  Procedure Laterality Date   TUBAL LIGATION     Patient Active Problem List   Diagnosis Date Noted   Bradycardia 01/17/2022   Parkinson's disease 02/17/2020   Gait abnormality 12/04/2019   Prediabetes 12/03/2018   Vitamin D deficiency 12/03/2018   Anemia due to vitamin B12 deficiency 12/03/2018   Fatigue 12/03/2018   Vitamin B12 deficiency 10/01/2018   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 No  PATIENT GOALS: improve confidence while speaking  OBJECTIVE:   TODAY'S TREATMENT:                                                                                                                                         08-11-22: Pt entered session averaging above 70 dB.   "Ah"- 77 dB (very consistent, even volume across trials) Counting- 72 dB Reading- 70 dB  Cognitive- 68-70 dB (rare verbal cues to increase volume) With rare min gestural cues, pt maintained adequate volume across 15 minute conversation in noisy environment (radio on and clinician sitting across the room). SLP able to determine intended message in 100% of trials.   Daughter Lattie Haw) helps her with calendar so pt can keep track of schedule. Has spreadsheets for calendar, medication and appointments.   08-04-22: Opening conversation averaged mid 60s dB. Optimized patient volume and intensity throughout Speak Out! Program. Rare min A required to achieve targeted dB levels. Conversation of recent event averaged 70 dB with occasional min A to maintain targeted volume. Plan to target conversational  loudness outside of ST room in upcoming ST sessions. Good carryover of recommended memory compensations reported (checklists and schedules) with benefit reported by patient and family.   07-28-22: Pt accompanied by family members. Averaged 63 dB at start of session. SLP led pt through Speak Out! Lesson 9 and discussed the importance of HEP to warm up her voice. Provided model, pt demonstrated significant improvement in volume at start of warm up. Provided re-education concerning importance of intent. Pt brought notes from a panel she plans to speak out on Saturday. Clinician utilized presented notes to implement Speak Out! Strategies. Pt required consistent fading to intermittent verbal and visual cues to increase volume during conversational task. SLP encouraged pt to implement her own visual cues to improve speech volume. At start and end of session, SLP requested pt repeat list of items she needs to remember when she leaves the house. Provided education on using visual aids at home to aid recall of pertinent items.   Ah average: 88 dB Conversation average: 68 dB    07-26-22: SLP provided education on potential  metrics which may aid in improved carryover of intentional speech and increased vocal intensity. Encouraged pt to think about perceived effort level, reading facial expression of conversational partner, noting when being ask to repeat, making self-inquiry as to whether pt is channeling her "preacher" voice. Education on for improved communication efficacy, pt should not rely on using her "normal" voice. For improved volume, she needs to avoid what feels normal and speak with more effort/volume. Generated visual cue for hand to ear to aid in cueing from daughter when she does not want to interrupt in middle of speech for verbal cue. Pt with her questions for panel, SLP provided aid for generating key points Tzipora wants to relay in answering questions on panel. SLP asked pt to provided brief introduction to practice- pt with reduced intensity (64 dB) and usual extended pauses. SLP encourages pt to write key points and practice to mitigate complexities of public speaking task.   07-19-22: Generated strategy and visual aid to aid recall of necessary items for departure. With structured repetitive practice, pt able to recall 5 needed items with mod I by end of session. Provided instructions and visual aid to implement at home. Pt continues with online Speak Out! Lessons daily. Conversational volume initially averaged mid 60s dB.Targeted improving vocal quality and increasing intensity through progressively difficulty speech tasks using Speak Out! program, lesson 1 review. ST leads pt through exercises providing occasional model prior to pt execution. occasional min-A required to achieve target dB this date. Averages this date: loud "ah" 86 dB; reading 77 dB; cognitive speech task 73 dB. Conversational sample of approx 5 minutes, pt averages low 70s dB with occasional min-A.  PATIENT EDUCATION: Education details: see above Person educated: Patient and Child(ren) Education method: Holiday representative Education comprehension: verbalized understanding, returned demonstration, and needs further education  HOME EXERCISE PROGRAM: Speak Out! Program    GOALS: Goals reviewed with patient? Yes  SHORT TERM GOALS: Target date: 08/10/2022  Pt will achieve targeted dB levels in demonstration of Speak Out! Lessons with 90% accuracy given rare min A over 2 sessions  Baseline: 08/04/22 Goal status: MET  2.  Pt will utilize dysarthria compensations in 5-10 minute conversation and average WNL conversational volume (70-72 dB) given occasional min A over 2 sessions   Baseline: 08/04/22 Goal status: MET  3.  Pt and family will carryover memory compensations to aid daily functioning at home (  recalling needed items/daily tasks) given occasional min A over 2 sessions  Baseline: 07-28-22, 08/04/22 Goal status: MET  4.  Pt will carryover dysarthria strategies in public speaking role play to be 100% intelligible given occasional min A Baseline:  Goal status: MET  LONG TERM GOALS: Target date: 09/07/2022  Pt will utilize dysarthria compensations in 15+ minute conversation to optimize vocal intensity and clarity given rare min A over 2 sessions Baseline:  Goal status: IN PROGRESS  2.  Pt and family will carryover memory compensations to aid daily functioning at home (recalling needed items/daily tasks) with mod I  Baseline:  Goal status: IN PROGRESS  3.  Pt will subjectively report increased confidence and attend increased number of events per family baseline  Baseline:  Goal status: IN PROGRESS   ASSESSMENT:  CLINICAL IMPRESSION: Patient is a 72 y.o. Female who was seen today for progression of Parkinson's Disease. Today, pt presents with mild hypokinetic dysarthria with initial conversational volume averaging mid 60s dB. Following re-education of principle of intent and Speak Out! program, pt averaged low 70s dB. Education and instruction of memory compensations continued today. Pt would  benefit from skilled ST intervention to optimize confidence and communication effectiveness as well as maximize cognitive functioning with cognitive compensation training.   OBJECTIVE IMPAIRMENTS: Objective impairments include memory and dysarthria. These impairments are limiting patient from household responsibilities and effectively communicating at home and in community.Factors affecting potential to achieve goals and functional outcome are medical prognosis.. Patient will benefit from skilled SLP services to address above impairments and improve overall function.  REHAB POTENTIAL: Good  PLAN:  SLP FREQUENCY: 2x/week  SLP DURATION: 8 weeks  PLANNED INTERVENTIONS: Language facilitation, Environmental controls, Cueing hierachy, Cognitive reorganization, Internal/external aids, Functional tasks, Multimodal communication approach, SLP instruction and feedback, Compensatory strategies, and Patient/family education    Leroy Libman, Student-SLP 08/11/2022, 3:36 PM

## 2022-08-11 NOTE — Therapy (Signed)
OUTPATIENT PHYSICAL THERAPY NEURO TREATMENT   Patient Name: Denise [name] MRN: [id number] DOB:Nov 20, 1950, 72 y.o., female Today's Date: 08/11/2022   PCP: Minette Brine, FNP  REFERRING PROVIDER: Buck Mam Audrea Muscat, MD    END OF SESSION:  PT End of Session - 08/11/22 1533     Visit Number 7    Number of Visits 17    Date for PT Re-Evaluation 09/12/22    Authorization Type BCBS    PT Start Time 1532    PT Stop Time 1611    PT Time Calculation (min) 39 min    Equipment Utilized During Treatment Gait belt    Activity Tolerance Patient tolerated treatment well    Behavior During Therapy WFL for tasks assessed/performed                  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   Past Surgical History:  Procedure Laterality Date   TUBAL LIGATION     Patient Active Problem List   Diagnosis Date Noted   Bradycardia 01/17/2022   Parkinson's disease 02/17/2020   Gait abnormality 12/04/2019   Prediabetes 12/03/2018   Vitamin D deficiency 12/03/2018   Anemia due to vitamin B12 deficiency 12/03/2018   Fatigue 12/03/2018   Vitamin B12 deficiency 10/01/2018   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 Doing well overall, "I  gotta be more consistent" regarding her exercises.   Pt accompanied by: family member daughter   PERTINENT HISTORY: PD, glaucoma, osteoporosis (newly diagnosed), TB (as a child)    PAIN:  Are you having pain? No  PRECAUTIONS: Fall and Other: Osteoporosis  WEIGHT BEARING RESTRICTIONS: No  FALLS: Has patient fallen in last 6 months? Yes. Number of falls 2  LIVING ENVIRONMENT: Lives with: lives with their family - daughter and granddaughter  Lives  in: House/apartment Stairs: Yes: External: 2 steps; none Has following equipment at home: Single point cane and Grab bars  PLOF: Independent, pt's daughter reports sometimes pt needs help with dressing   PATIENT GOALS: See how she was since the last time she was here. Work on the balance   OBJECTIVE:   TODAY'S TREATMENT:     Ther Act  STG Assessment    [facility] PT Assessment - 08/11/22 1535       Mini-BESTest   Sit To Stand Normal: Comes to stand without use of hands and stabilizes independently.    Rise to Toes Moderate: Heels up, but not full range (smaller than when holding hands), OR noticeable instability for 3 s.    Stand on one leg (left) Moderate: < 20 s   14s   Stand on one leg (right) Moderate: < 20 s   9s   Stand on one leg - lowest score 1    Compensatory Stepping Correction - Forward Normal: Recovers independently with a single, large step (second realignement is allowed).    Compensatory Stepping Correction - Backward Normal: Recovers independently with a single, large step    Compensatory Stepping Correction - Left Lateral Severe: Falls, or cannot step   several small sh   Compensatory Stepping Correction - Right Lateral Normal: Recovers independently with 1 step (crossover or lateral OK)    Stepping Corredtion Lateral - lowest score 0    Stance - Feet together, eyes open, firm surface  Normal: 30s    Stance - Feet together, eyes closed, foam surface  Normal: 30s    Incline - Eyes Closed Normal: Stands independently  30s and aligns with gravity    Change in Gait Speed Normal: Significantly changes walkling speed without imbalance    Walk with head turns - Horizontal Moderate: performs head turns with reduction in gait speed.    Walk with pivot turns Moderate:Turns with feet close SLOW (>4 steps) with good balance.    Step over obstacles Moderate: Steps over box but touches box OR displays cautious behavior by slowing gait.    Timed UP & GO with Dual Task Normal: No noticeable change in sitting, standing or walking while backward counting when compared to TUG without    Mini-BEST total score 21      Timed Up and Go Test   Normal TUG (seconds) 15.69    Manual TUG (seconds) 15.52    Cognitive TUG (seconds) 16.19   retro counting by 1s   TUG Comments Performed with no AD              PATIENT EDUCATION: Education details: Continue HEP, goal outcomes Person educated: Patient and daughter  Education method: Explanation, Demonstration, and Verbal cues Education comprehension: verbalized understanding, returned demonstration, and needs further education  HOME EXERCISE PROGRAM: From previous bout of therapy: Will review/updated as needed.   Standing and seated PWR, Pt has in her exercise folder.     Access Code: [email or web address] URL: [email or web address] Date: 10/19/2021 Prepared by: [name]   Reviewed/condensed exercises for balance:    Exercises - Alternating Step Backward with Support  - 1 x daily - 5 x weekly - 1-2 sets - 10 reps - Romberg Stance Eyes Closed on Foam Pad  - 1 x daily - 5 x weekly - 3 sets - 30 hold   Plus standing tall against the wall for posture - 2 x 30 second holds    GOALS: Goals reviewed with patient? Yes  SHORT  TERM GOALS: ALL STGS = LTGS   LONG TERM GOALS: Target date: 09/08/2022 (updated for extended POC)   Pt will be independent with final PD HEP for improved strength, balance, transfers, and gait  Baseline:  Goal status: IN PROGRESS  2.   Pt will improve miniBEST to at least a 22/28 in order to demo decr fall risk Baseline: 19/28; 21/28 on 2/8 Goal status: IN PROGRESS  3.  Pt will improve gait speed with no AD vs. LRAD to at least 2.7 ft/sec in order to demo improved community mobility.   Baseline: 14.63 seconds with no AD = 2.24 ft/sec  Goal status: INITIAL  4.  Pt will improve manual TUG time to 14.5 seconds or less in order to demo decrease fall risk Baseline: 16.63 seconds; 15.52s on 2/8 Goal status: IN PROGRESS  5.  Pt will improve 5x sit<>stand to less than or equal to 14 sec to demonstrate improved functional strength and transfer efficiency. Baseline: 15.9 seconds with no UE support  Goal status: INITIAL  6.  Pt will verbalize and demonstrate understanding of fall prevention in the home/community (use of appropriate AD, backing up into chairs, freezing strategies) Baseline:  Goal status: INITIAL  ASSESSMENT:  CLINICAL IMPRESSION: Emphasis of skilled PT session on STG assessment. Pt continues to progress towards her LTGs, improving her minibest to 21/28 and her manual TUG to 15.52s. Pt continues to have difficulty w/SLS, balance on unlevel surfaces and stepping strategies in lateral directions. However, these have improved since eval. Continue POC.   OBJECTIVE IMPAIRMENTS: Abnormal gait, decreased activity tolerance, decreased balance, decreased cognition, decreased coordination, decreased knowledge of use of DME, decreased mobility, difficulty walking, decreased strength, decreased safety awareness, impaired flexibility, impaired tone, and postural dysfunction.   ACTIVITY LIMITATIONS: carrying, lifting, bending, stairs, transfers, dressing, and locomotion level  PARTICIPATION LIMITATIONS: meal prep, cleaning, driving, shopping, and community activity  PERSONAL FACTORS: Age, Behavior pattern, Past/current experiences, Time since onset of injury/illness/exacerbation, and 3+ comorbidities: PD, glaucoma,  osteoporosis (newly diagnosed), TB (as a child)    are also affecting patient's functional outcome.   REHAB POTENTIAL: Good  CLINICAL DECISION MAKING: Evolving/moderate complexity  EVALUATION COMPLEXITY: Moderate  PLAN:  PT FREQUENCY: 2x/week  PT DURATION: 8 weeks (anticipate maybe needing more than 4 weeks when pt initially got scheduled)  PLANNED INTERVENTIONS: Therapeutic exercises, Therapeutic activity, Neuromuscular re-education, Balance training, Gait training, Patient/Family education, Self Care, Stair training, Vestibular training, Visual/preceptual remediation/compensation, DME instructions, Manual therapy, and Re-evaluation  PLAN FOR NEXT SESSION:  Continue to work on gait with cane and rollator - trying obstacles. Review HEP and update as needed. Work on turning all the way before sitting down to a chair/getting up from low surfaces. Posture awareness as pt with heavy lean to L. Lateral stepping strategies/weight shifting. Pt requesting ball kicks on TM   Dehlia Kilner E Ismael Treptow, PT, DPT  08/11/2022, 4:15 PM

## 2022-08-16 ENCOUNTER — Encounter: Payer: Self-pay | Admitting: Physical Therapy

## 2022-08-16 ENCOUNTER — Ambulatory Visit: Payer: Medicare Other | Admitting: Speech Pathology

## 2022-08-16 ENCOUNTER — Telehealth: Payer: Self-pay

## 2022-08-16 ENCOUNTER — Ambulatory Visit: Payer: Medicare Other | Admitting: Physical Therapy

## 2022-08-16 DIAGNOSIS — R29818 Other symptoms and signs involving the nervous system: Secondary | ICD-10-CM | POA: Diagnosis not present

## 2022-08-16 DIAGNOSIS — R41841 Cognitive communication deficit: Secondary | ICD-10-CM | POA: Diagnosis not present

## 2022-08-16 DIAGNOSIS — M7989 Other specified soft tissue disorders: Secondary | ICD-10-CM

## 2022-08-16 DIAGNOSIS — R471 Dysarthria and anarthria: Secondary | ICD-10-CM | POA: Diagnosis not present

## 2022-08-16 DIAGNOSIS — R2681 Unsteadiness on feet: Secondary | ICD-10-CM | POA: Diagnosis not present

## 2022-08-16 DIAGNOSIS — R2689 Other abnormalities of gait and mobility: Secondary | ICD-10-CM | POA: Diagnosis not present

## 2022-08-16 DIAGNOSIS — R29898 Other symptoms and signs involving the musculoskeletal system: Secondary | ICD-10-CM | POA: Diagnosis not present

## 2022-08-16 DIAGNOSIS — D2271 Melanocytic nevi of right lower limb, including hip: Secondary | ICD-10-CM | POA: Diagnosis not present

## 2022-08-16 DIAGNOSIS — D2262 Melanocytic nevi of left upper limb, including shoulder: Secondary | ICD-10-CM | POA: Diagnosis not present

## 2022-08-16 DIAGNOSIS — M6281 Muscle weakness (generalized): Secondary | ICD-10-CM

## 2022-08-16 DIAGNOSIS — M25612 Stiffness of left shoulder, not elsewhere classified: Secondary | ICD-10-CM | POA: Diagnosis not present

## 2022-08-16 DIAGNOSIS — B351 Tinea unguium: Secondary | ICD-10-CM | POA: Diagnosis not present

## 2022-08-16 DIAGNOSIS — R293 Abnormal posture: Secondary | ICD-10-CM | POA: Diagnosis not present

## 2022-08-16 DIAGNOSIS — R278 Other lack of coordination: Secondary | ICD-10-CM | POA: Diagnosis not present

## 2022-08-16 DIAGNOSIS — D2261 Melanocytic nevi of right upper limb, including shoulder: Secondary | ICD-10-CM | POA: Diagnosis not present

## 2022-08-16 NOTE — Therapy (Signed)
OUTPATIENT SPEECH LANGUAGE PATHOLOGY PARKINSON'S TREATMENT   Patient Name: Denise [name] MRN: [id number] DOB:[birth year], 72 y.o., female Today's Date: 08/16/2022  PCP: Minette Brine FNP REFERRING PROVIDER: Thenganatt   END OF SESSION:  End of Session - 08/16/22 1532     Visit Number 7    Number of Visits 17    Date for SLP Re-Evaluation 09/07/22    Authorization Type BCBS    SLP Start Time 1445    SLP Stop Time  1531    SLP Time Calculation (min) 46 min    Activity Tolerance Patient tolerated treatment well             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   Past Surgical History:  Procedure Laterality Date   TUBAL LIGATION     Patient Active Problem List   Diagnosis Date Noted   Bradycardia 01/17/2022   Parkinson's disease 02/17/2020   Gait abnormality 12/04/2019   Prediabetes 12/03/2018   Vitamin D deficiency 12/03/2018   Anemia due to vitamin B12 deficiency 12/03/2018   Fatigue 12/03/2018   Vitamin B12 deficiency 10/01/2018   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 No  PATIENT GOALS: improve confidence while speaking  OBJECTIVE:   TODAY'S TREATMENT:                                                                                                                                         08-16-22: Pt accompanied by  daughter.  "Ah"- 79 dB Counting- 72-73 dB Reading- 68-70 dB Cognitive- 68 dB Conversation- 68 dB Pt reported that she continues to have moments of reduced volume. Independently uses environment to help determine if she's loud enough, such as if other people passing by can hear her when she greets them. Reported that her daughter will reminder her to speak louder when she does not self correct.  SLP provided occasional min verbal cues to maintain adequate volume across cognitive exercise. Pt self-corrected  x1 without cues during 15 minute conversation. Clinician provided occasional min verbal cues and x1 gestural cue.   08-11-22: Pt entered session averaging above 70 dB.  "Ah"- 77 dB (very consistent, even volume across trials) Counting- 72 dB Reading- 70 dB  Cognitive-  68-70 dB (rare verbal cues to increase volume) With rare min gestural cues, pt maintained adequate volume across 15 minute conversation in noisy environment (radio on and clinician sitting across the room). SLP able to determine intended message in 100% of trials.   Daughter Lattie Haw) helps her with calendar so pt can keep track of schedule. Has spreadsheets for calendar, medication and appointments.   08-04-22: Opening conversation averaged mid 60s dB. Optimized patient volume and intensity throughout Speak Out! Program. Rare min A required to achieve targeted dB levels. Conversation of recent event averaged 70 dB with occasional min A to maintain targeted volume. Plan to target conversational loudness outside of ST room in upcoming ST sessions. Good carryover of recommended memory compensations reported (checklists and schedules) with benefit reported by patient and family.   07-28-22: Pt accompanied by family members. Averaged 63 dB at start of session. SLP led pt through Speak Out! Lesson 9 and discussed the importance of HEP to warm up her voice. Provided model, pt demonstrated significant improvement in volume at start of warm up.  Provided re-education concerning importance of intent. Pt brought notes from a panel she plans to speak out on Saturday. Clinician utilized presented notes to implement Speak Out! Strategies. Pt required consistent fading to intermittent verbal and visual cues to increase volume during conversational task. SLP encouraged pt to implement her own visual cues to improve speech volume. At start and end of session, SLP requested pt repeat list of items she needs to remember when she leaves the house. Provided education on using visual aids at home to aid recall of pertinent items.   Ah average: 88 dB Conversation average: 68 dB    07-26-22: SLP provided education on potential metrics which may aid in improved carryover of intentional speech and increased vocal intensity. Encouraged pt to think about perceived effort level, reading facial expression of conversational partner, noting when being ask to repeat, making self-inquiry as to whether pt is channeling her "preacher" voice. Education on for improved communication efficacy, pt should not rely on using her "normal" voice. For improved volume, she needs to avoid what feels normal and speak with more effort/volume. Generated visual cue for hand to ear to aid in cueing from daughter when she does not want to interrupt in middle of speech for verbal cue. Pt with her questions for panel, SLP provided aid for generating key points Tykeyah wants to relay in answering questions on panel. SLP asked pt to provided brief introduction to practice- pt with reduced intensity (64 dB) and usual extended pauses. SLP encourages pt to write key points and practice to mitigate complexities of public speaking task.   07-19-22: Generated strategy and visual aid to aid recall of necessary items for departure. With structured repetitive practice, pt able to recall 5 needed items with mod I by end of session. Provided instructions and visual aid to implement at home. Pt continues with  online Speak Out! Lessons daily. Conversational volume initially averaged mid 60s dB.Targeted improving vocal quality and increasing intensity through progressively difficulty speech tasks using Speak Out! program, lesson 1 review. ST leads pt through exercises providing occasional model prior to pt execution. occasional min-A required to achieve target dB this date. Averages this date: loud "ah" 86 dB; reading 77 dB; cognitive speech task 73 dB. Conversational sample of approx 5 minutes, pt averages low 70s dB with occasional min-A.  PATIENT EDUCATION: Education details: see above Person educated: Patient and Child(ren) Education method: Dance movement psychotherapist  comprehension: verbalized understanding, returned demonstration, and needs further education  HOME EXERCISE PROGRAM: Speak Out! Program    GOALS: Goals reviewed with patient? Yes  SHORT TERM GOALS: Target date: 08/10/2022  Pt will achieve targeted dB levels in demonstration of Speak Out! Lessons with 90% accuracy given rare min A over 2 sessions  Baseline: 08/04/22 Goal status: MET  2.  Pt will utilize dysarthria compensations in 5-10 minute conversation and average WNL conversational volume (70-72 dB) given occasional min A over 2 sessions   Baseline: 08/04/22 Goal status: MET  3.  Pt and family will carryover memory compensations to aid daily functioning at home (recalling needed items/daily tasks) given occasional min A over 2 sessions  Baseline: 07-28-22, 08/04/22 Goal status: MET  4.  Pt will carryover dysarthria strategies in public speaking role play to be 100% intelligible given occasional min A Baseline:  Goal status: MET  LONG TERM GOALS: Target date: 09/07/2022  Pt will utilize dysarthria compensations in 15+ minute conversation to optimize vocal intensity and clarity given rare min A over 2 sessions Baseline:  Goal status: IN PROGRESS  2.  Pt and family will carryover memory compensations to aid daily  functioning at home (recalling needed items/daily tasks) with mod I  Baseline:  Goal status: IN PROGRESS  3.  Pt will subjectively report increased confidence and attend increased number of events per family baseline  Baseline:  Goal status: IN PROGRESS   ASSESSMENT:  CLINICAL IMPRESSION: Pt demonstrating good progress toward goals. SLP recommends 1x per week, pt agreeable.   OBJECTIVE IMPAIRMENTS: Objective impairments include memory and dysarthria. These impairments are limiting patient from household responsibilities and effectively communicating at home and in community.Factors affecting potential to achieve goals and functional outcome are medical prognosis.. Patient will benefit from skilled SLP services to address above impairments and improve overall function.  REHAB POTENTIAL: Good  PLAN:  SLP FREQUENCY: 2x/week  SLP DURATION: 8 weeks  PLANNED INTERVENTIONS: Language facilitation, Environmental controls, Cueing hierachy, Cognitive reorganization, Internal/external aids, Functional tasks, Multimodal communication approach, SLP instruction and feedback, Compensatory strategies, and Patient/family education    Leroy Libman, Student-SLP 08/16/2022, 3:32 PM

## 2022-08-16 NOTE — Telephone Encounter (Signed)
Pt walked into office requesting to purchase 2 pair of compression stockings. Pt's measurements were not in the computer. Measured pt, measurements placed in chart, 2 pair knee high stockings (1 of each color) given to pt to purchase.

## 2022-08-16 NOTE — Therapy (Signed)
OUTPATIENT PHYSICAL THERAPY NEURO TREATMENT   Patient Name: Denise [name] MRN: [id number] DOB:[birth year], 72 y.o., female Today's Date: 08/16/2022   PCP: Minette Brine, FNP  REFERRING PROVIDER: Buck Mam Audrea Muscat, MD    END OF SESSION:  PT End of Session - 08/16/22 1406     Visit Number 8    Number of Visits 17    Date for PT Re-Evaluation 09/12/22    Authorization Type BCBS    PT Start Time 1402    PT Stop Time 1443    PT Time Calculation (min) 41 min    Equipment Utilized During Treatment Gait belt    Activity Tolerance Patient tolerated treatment well    Behavior During Therapy WFL for tasks assessed/performed                  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   Past Surgical History:  Procedure Laterality Date   TUBAL LIGATION     Patient Active Problem List   Diagnosis Date Noted   Bradycardia 01/17/2022   Parkinson's disease 02/17/2020   Gait abnormality 12/04/2019   Prediabetes 12/03/2018   Vitamin D deficiency 12/03/2018   Anemia due to vitamin B12 deficiency 12/03/2018   Fatigue 12/03/2018   Vitamin B12 deficiency 10/01/2018   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 Nothing new.   Pt  accompanied by: family member daughter   PERTINENT HISTORY: PD, glaucoma, osteoporosis (newly diagnosed), TB (as a child)    PAIN:  Are you having pain? No  PRECAUTIONS: Fall and Other: Osteoporosis  WEIGHT BEARING RESTRICTIONS: No  FALLS: Has patient fallen in last 6 months? Yes. Number of falls 2  LIVING ENVIRONMENT: Lives with: lives with their family - daughter and granddaughter  Lives in: House/apartment Stairs: Yes: External: 2 steps; none Has  following equipment at home: Single point cane and Grab bars  PLOF: Independent, pt's daughter reports sometimes pt needs help with dressing   PATIENT GOALS: See how she was since the last time she was here. Work on the balance   OBJECTIVE:   TODAY'S TREATMENT:     NMR:  Pt ambulates in and out of session with rollator with cued for posture and incr stride length with gait.   The following treadmill training was completed for aerobic/neural priming, increased step length, endurance, and gait speed. BUE support throughout - Warmup: 1:00 up to 1.0 mph. Min cues for upright posture and increased step length  - HIIT: 5:00 30sec ON/OFF alternating green physioball kicks and normal walking at 1.0 mph. Mod verbal cues for high amplitude and velocity kicks esp with LLE. Additional cues to look out the window for posture and during OFF times to continue to focus on larger amplitude steps. Pt reporting initial level of effort at 5-6/10, pt able to work up to a 7-8/10 level of effort with incr step intensity. Cued to not hear pt's feet on the treadmill.  -Cool down: 60 sec at 1.0 mph, with continued focus on stride length and foot clearance   With 6 blaze pods on the mirror (in superior, lateral, and inferior directions) working on visual scanning, upright posture, weight shifting, trunk rotation and balance. Pt standing on air ex for compliant surface for balance:  Performed 1:30 bouts each - cued to reach across body for incr trunk rotation  and opening up hands big when going to tap blaze pod: 1st bout: 11 hits, 2nd bout: 17 hits  Worked on lateral stepping strategy with PT tech providing resistance using resisted belt in the lateral direction, with pt stepping laterally out to floor dot as visual cue on how far to step, performed x20 reps each side, progressing to PWR Step with reaching out and looking at hand, PT tech and then PT providing random perturbations with pt having to regain balance using stepping strategy. Min guard for balance, but pt able to maintain balance   With sit <> stands during session, cued for incr forward lean to stand   PATIENT EDUCATION: Education details: Continue HEP, importance of large amplitude movements with gait  Person educated: Patient and daughter  Education method: Explanation, Demonstration, and Verbal cues Education comprehension: verbalized understanding, returned demonstration, and needs further education  HOME EXERCISE PROGRAM: From previous bout of therapy: Will review/updated as needed.   Standing and seated PWR, Pt has in her exercise folder.     Access Code: [email or web address] URL: [email or web address] Date: 10/19/2021 Prepared by: [name]   Reviewed/condensed exercises for balance:    Exercises - Alternating Step Backward with Support  - 1 x daily - 5 x weekly - 1-2 sets - 10 reps - Romberg Stance Eyes Closed on Foam Pad  - 1 x daily - 5 x weekly - 3 sets - 30 hold   Plus standing tall against the wall for posture - 2 x 30 second holds    GOALS: Goals reviewed with patient? Yes  SHORT TERM GOALS: ALL STGS = LTGS   LONG TERM GOALS: Target date: 09/08/2022 (updated for extended POC)   Pt will be independent with final PD HEP for improved strength, balance, transfers, and gait  Baseline:  Goal status: IN PROGRESS  2.  Pt will improve miniBEST to at least a 22/28 in order to demo decr fall risk Baseline: 19/28; 21/28 on 2/8 Goal status:  IN  PROGRESS  3.  Pt will improve gait speed with no AD vs. LRAD to at least 2.7 ft/sec in order to demo improved community mobility.   Baseline: 14.63 seconds with no AD = 2.24 ft/sec  Goal status: INITIAL  4.  Pt will improve manual TUG time to 14.5 seconds or less in order to demo decrease fall risk Baseline: 16.63 seconds; 15.52s on 2/8 Goal status: IN PROGRESS  5.  Pt will improve 5x sit<>stand to less than or equal to 14 sec to demonstrate improved functional strength and transfer efficiency. Baseline: 15.9 seconds with no UE support  Goal status: INITIAL  6.  Pt will verbalize and demonstrate understanding of fall prevention in the home/community (use of appropriate AD, backing up into chairs, freezing strategies) Baseline:  Goal status: INITIAL  ASSESSMENT:  CLINICAL IMPRESSION: Worked  on larger amplitude movements on the treadmill with kicking a physioball in regards to step length, with pt initially reporting effort level as a 5-6/10, and able to incr to a 7-8/10 with cues. After treadmill, pt able to demo improved stride length. Remainder of session worked on balance with lateral stepping strategy and trunk rotation/compliant surfaces and visual scanning with use of blaze pods. Pt tolerated session well, will continue to progress towards LTGs.   OBJECTIVE IMPAIRMENTS: Abnormal gait, decreased activity tolerance, decreased balance, decreased cognition, decreased coordination, decreased knowledge of use of DME, decreased mobility, difficulty walking, decreased strength, decreased safety awareness, impaired flexibility, impaired tone, and postural dysfunction.   ACTIVITY LIMITATIONS: carrying, lifting, bending, stairs, transfers, dressing, and locomotion level  PARTICIPATION LIMITATIONS: meal prep, cleaning, driving, shopping, and community activity  PERSONAL FACTORS: Age, Behavior pattern, Past/current experiences, Time since onset of injury/illness/exacerbation, and 3+  comorbidities: PD, glaucoma, osteoporosis (newly diagnosed), TB (as a child)    are also affecting patient's functional outcome.   REHAB POTENTIAL: Good  CLINICAL DECISION MAKING: Evolving/moderate complexity  EVALUATION COMPLEXITY: Moderate  PLAN:  PT FREQUENCY: 2x/week  PT DURATION: 8 weeks (anticipate maybe needing more than 4 weeks when pt initially got scheduled)  PLANNED INTERVENTIONS: Therapeutic exercises, Therapeutic activity, Neuromuscular re-education, Balance training, Gait training, Patient/Family education, Self Care, Stair training, Vestibular training, Visual/preceptual remediation/compensation, DME instructions, Manual therapy, and Re-evaluation  PLAN FOR NEXT SESSION:  Continue to work on gait with cane and rollator - trying obstacles. Review HEP and update as needed. Work on turning all the way before sitting down to a chair/getting up from low surfaces. Posture awareness as pt with heavy lean to L. Lateral stepping strategies/weight shifting.Continue ball kicks on TM.    Arliss Journey, PT, DPT  08/16/2022, 2:46 PM

## 2022-08-18 ENCOUNTER — Ambulatory Visit: Payer: Medicare Other | Admitting: Speech Pathology

## 2022-08-18 ENCOUNTER — Ambulatory Visit: Payer: Medicare Other | Admitting: Physical Therapy

## 2022-08-18 DIAGNOSIS — M25612 Stiffness of left shoulder, not elsewhere classified: Secondary | ICD-10-CM | POA: Diagnosis not present

## 2022-08-18 DIAGNOSIS — R29818 Other symptoms and signs involving the nervous system: Secondary | ICD-10-CM | POA: Diagnosis not present

## 2022-08-18 DIAGNOSIS — R29898 Other symptoms and signs involving the musculoskeletal system: Secondary | ICD-10-CM | POA: Diagnosis not present

## 2022-08-18 DIAGNOSIS — R41841 Cognitive communication deficit: Secondary | ICD-10-CM | POA: Diagnosis not present

## 2022-08-18 DIAGNOSIS — R293 Abnormal posture: Secondary | ICD-10-CM

## 2022-08-18 DIAGNOSIS — R2681 Unsteadiness on feet: Secondary | ICD-10-CM

## 2022-08-18 DIAGNOSIS — R2689 Other abnormalities of gait and mobility: Secondary | ICD-10-CM

## 2022-08-18 DIAGNOSIS — R471 Dysarthria and anarthria: Secondary | ICD-10-CM | POA: Diagnosis not present

## 2022-08-18 DIAGNOSIS — R278 Other lack of coordination: Secondary | ICD-10-CM | POA: Diagnosis not present

## 2022-08-18 DIAGNOSIS — M6281 Muscle weakness (generalized): Secondary | ICD-10-CM | POA: Diagnosis not present

## 2022-08-18 NOTE — Therapy (Signed)
OUTPATIENT PHYSICAL THERAPY NEURO TREATMENT   Patient Name: Denise [name] MRN: [id number] DOB:February 16, 1951, 72 y.o., female Today's Date: 08/18/2022   PCP: Minette Brine, FNP  REFERRING PROVIDER: Buck Mam Audrea Muscat, MD    END OF SESSION:  PT End of Session - 08/18/22 1116     Visit Number 9    Number of Visits 17    Date for PT Re-Evaluation 09/12/22    Authorization Type BCBS    PT Start Time 1116   Pt arrived late and requested to use restroom prior to session   PT Stop Time 1147    PT Time Calculation (min) 31 min    Equipment Utilized During Treatment Gait belt    Activity Tolerance Patient tolerated treatment well    Behavior During Therapy [facility] for tasks assessed/performed                   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   Past Surgical History:  Procedure Laterality Date   TUBAL LIGATION     Patient Active Problem List   Diagnosis Date Noted   Bradycardia 01/17/2022   Parkinson's disease 02/17/2020   Gait abnormality 12/04/2019   Prediabetes 12/03/2018   Vitamin D deficiency 12/03/2018   Anemia due to vitamin B12 deficiency 12/03/2018   Fatigue 12/03/2018   Vitamin B12 deficiency 10/01/2018   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 Nothing new.   Pt accompanied by: family member daughter   PERTINENT HISTORY: PD, glaucoma, osteoporosis (newly diagnosed), TB (as a child)    PAIN:  Are you having pain? No  PRECAUTIONS: Fall and Other: Osteoporosis  WEIGHT BEARING RESTRICTIONS: No  FALLS: Has patient fallen in last 6 months? Yes. Number of falls 2  LIVING ENVIRONMENT: Lives with: lives with their family - daughter and  granddaughter  Lives in: House/apartment Stairs: Yes: External: 2 steps; none Has following equipment at home: Single point cane and Grab bars  PLOF: Independent, pt's daughter reports sometimes pt needs help with dressing   PATIENT GOALS: See how she was since the last time she was here. Work on the balance   OBJECTIVE:  TODAY'S TREATMENT:     Personnel officer  The following treadmill training was completed for aerobic/neural priming, endurance, and gait speed. BUE support throughout:  - Warmup: 2:00 up to 1.2 mph. Min cues for increased step length and clearance, as pt initially scuffing w/LLE  - HIIT: 5:00 30sec ON/OFF alternating green theraball kicks and normal walking at 1.2 mph. Min cues for increased amplitude kicks and to find symmetrical rhythm to reduce bradykinetic kick on LLE. Cued to keep feet "silent" throughout for improved step clearance  -Cool down: 60 sec at 1.2 mph, with continued focus on stride length and foot clearance   NMR  In // bars, standing on rockerboard in L/R direction and using mirror for visual biofeedback on body position without UE support. Min tactile cues provided to shoulder and pelvis for equal weight shift and postural control. Progressed to PWR Up on board (provided by [facility] certified therapist), x10 reps for improved postural control. Pt initially moving very slowly, min cues for fast transition into "up" position. With increased velocity, pt had increased difficulty maintaining balance, but no LOB noted Sit <>stands x3 from  12" box w/green dynadisc on top to practice standing from lower, uneven surfaces w/CGA. Pt initially had poor eccentric control and did not reach well for stand <>sit, requiring min A to safely lower to box and sit in middle of box. Min cues for proper sequencing of sit <>stand which pt able to teach back w/each repetition. Cued pt to stand tall at top of rep for upright posture and midline orientation. Pt stated she enjoyed exercise and would like to practice again in future.    PATIENT EDUCATION: Education details: Continue HEP Person educated: Patient and daughter  Education method: Explanation, Demonstration, and Verbal cues Education comprehension: verbalized understanding, returned demonstration, and needs further education  HOME EXERCISE PROGRAM: From previous bout of therapy: Will review/updated as needed.   Standing and seated PWR, Pt has in her exercise folder.     Access Code: [email or web address] URL: [email or web address] Date: 10/19/2021 Prepared by: [name]   Reviewed/condensed exercises for balance:    Exercises - Alternating Step Backward with Support  - 1 x daily - 5 x weekly - 1-2 sets - 10 reps - Romberg Stance Eyes Closed on Foam Pad  - 1 x daily - 5 x weekly - 3 sets - 30 hold   Plus standing tall against the wall for posture - 2 x 30 second holds    GOALS: Goals reviewed with patient? Yes  SHORT TERM GOALS: ALL STGS = LTGS   LONG TERM GOALS: Target date: 09/08/2022 (updated for extended POC)   Pt will be independent with final PD HEP for improved strength, balance, transfers, and gait  Baseline:  Goal status: IN PROGRESS  2.  Pt will improve miniBEST to at least a 22/28 in order to demo decr fall risk Baseline: 19/28; 21/28 on 2/8 Goal status: IN PROGRESS  3.  Pt will improve gait speed with no AD vs. LRAD to at least 2.7 ft/sec in order to demo improved community mobility.   Baseline: 14.63 seconds with no AD = 2.24 ft/sec  Goal status:  INITIAL  4.  Pt will improve manual TUG time to 14.5 seconds or less in order to demo decrease fall risk Baseline: 16.63 seconds; 15.52s on 2/8 Goal status: IN PROGRESS  5.  Pt will improve 5x sit<>stand to less than or equal to 14 sec to demonstrate improved functional strength and transfer efficiency. Baseline: 15.9 seconds with no UE support  Goal status: INITIAL  6.  Pt will verbalize and demonstrate understanding of fall prevention in the home/community (use of appropriate AD, backing up into chairs, freezing strategies) Baseline:  Goal status: INITIAL  ASSESSMENT:  CLINICAL IMPRESSION: Session limited due to pt's late arrival. Emphasis of skilled PT session on improved step length/clearance w/gait, high amplitude movement and postural control. Pt able to perform ball kicks on TM at increased speed today and improved w/velocity of kicks on L side compared to previous session. Pt continues to demonstrate truncal lean to L side but is able to self-correct w/use of visual cues. Pt initially challenged by sit <>stands from low surface but adapted well and was able to perform w/CGA by end of session and teach back proper technique. Continue POC.   OBJECTIVE IMPAIRMENTS: Abnormal gait, decreased activity tolerance, decreased balance, decreased cognition, decreased coordination, decreased knowledge of use of DME, decreased mobility, difficulty walking, decreased strength, decreased safety awareness, impaired flexibility, impaired tone, and postural dysfunction.   ACTIVITY LIMITATIONS: carrying, lifting, bending, stairs, transfers, dressing, and locomotion level  PARTICIPATION LIMITATIONS: meal prep, cleaning, driving, shopping, and community activity  PERSONAL FACTORS: Age, Behavior pattern, Past/current experiences, Time since onset of injury/illness/exacerbation, and 3+ comorbidities: PD, glaucoma, osteoporosis (newly diagnosed), TB (as a child)    are also affecting patient's functional  outcome.   REHAB POTENTIAL: Good  CLINICAL DECISION MAKING: Evolving/moderate complexity  EVALUATION COMPLEXITY: Moderate  PLAN:  PT FREQUENCY: 2x/week  PT DURATION: 8 weeks (anticipate maybe needing more than 4 weeks when pt initially got scheduled)  PLANNED INTERVENTIONS: Therapeutic exercises, Therapeutic activity, Neuromuscular re-education, Balance training, Gait training, Patient/Family education, Self Care, Stair training, Vestibular training, Visual/preceptual remediation/compensation, DME instructions, Manual therapy, and Re-evaluation  PLAN FOR NEXT SESSION:  Continue to work on gait with cane and rollator - trying obstacles. Review HEP and update as needed. Work on turning all the way before sitting down to a chair/getting up from low surfaces. Posture awareness as pt with heavy lean to L. Lateral stepping strategies/weight shifting.Continue ball kicks on TM.    Cruzita Lederer Corrin Sieling, PT, DPT  08/18/2022, 11:50 AM

## 2022-08-19 ENCOUNTER — Other Ambulatory Visit: Payer: Self-pay | Admitting: Nurse Practitioner

## 2022-08-23 ENCOUNTER — Ambulatory Visit: Payer: Medicare Other | Admitting: Occupational Therapy

## 2022-08-23 ENCOUNTER — Ambulatory Visit: Payer: Medicare Other | Admitting: Physical Therapy

## 2022-08-23 ENCOUNTER — Encounter: Payer: Medicare Other | Admitting: Speech Pathology

## 2022-08-23 ENCOUNTER — Encounter: Payer: Self-pay | Admitting: Physical Therapy

## 2022-08-23 DIAGNOSIS — R471 Dysarthria and anarthria: Secondary | ICD-10-CM | POA: Diagnosis not present

## 2022-08-23 DIAGNOSIS — R293 Abnormal posture: Secondary | ICD-10-CM

## 2022-08-23 DIAGNOSIS — R29818 Other symptoms and signs involving the nervous system: Secondary | ICD-10-CM | POA: Diagnosis not present

## 2022-08-23 DIAGNOSIS — R29898 Other symptoms and signs involving the musculoskeletal system: Secondary | ICD-10-CM

## 2022-08-23 DIAGNOSIS — R278 Other lack of coordination: Secondary | ICD-10-CM

## 2022-08-23 DIAGNOSIS — M6281 Muscle weakness (generalized): Secondary | ICD-10-CM | POA: Diagnosis not present

## 2022-08-23 DIAGNOSIS — M25612 Stiffness of left shoulder, not elsewhere classified: Secondary | ICD-10-CM

## 2022-08-23 DIAGNOSIS — R2689 Other abnormalities of gait and mobility: Secondary | ICD-10-CM | POA: Diagnosis not present

## 2022-08-23 DIAGNOSIS — R41841 Cognitive communication deficit: Secondary | ICD-10-CM | POA: Diagnosis not present

## 2022-08-23 DIAGNOSIS — R2681 Unsteadiness on feet: Secondary | ICD-10-CM | POA: Diagnosis not present

## 2022-08-23 NOTE — Therapy (Signed)
, OUTPATIENT OCCUPATIONAL THERAPY PARKINSON'S TREATMENT  Patient Name: Denise [name] MRN: [id number] DOB:February 14, 1951, 72 y.o., female Today's Date: 08/23/2022  PCP: Minette Brine, FNP REFERRING PROVIDER: Dr. Audrea Muscat Thenganatt  END OF SESSION:  OT End of Session - 08/23/22 1507     Visit Number 4    Number of Visits 17    Date for OT Re-Evaluation 09/24/22    Authorization Type BCBS [place] 2024,  No Deductible,  [place]; [place]  Auth Not reqd    Authorization - Visit Number 4    Authorization - Number of Visits 10    Progress Note Due on Visit 10    OT Start Time 1451    OT Stop Time 1530    OT Time Calculation (min) 39 min    Activity Tolerance Patient tolerated treatment well                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   Past Surgical History:  Procedure Laterality Date   TUBAL LIGATION     Patient Active Problem List   Diagnosis Date Noted   Bradycardia 01/17/2022   Parkinson's disease 02/17/2020   Gait abnormality 12/04/2019   Prediabetes 12/03/2018   Vitamin D deficiency 12/03/2018   Anemia due to vitamin B12 deficiency 12/03/2018   Fatigue 12/03/2018   Vitamin B12 deficiency 10/01/2018   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   Abnormal auditory perception of both ears 05/10/2016   Osteopenia 08/23/2012    ONSET DATE: 07/02/23  REFERRING DIAG: Parkinson's Disease  THERAPY DIAG:  Other abnormalities of gait and mobility  Abnormal posture  Muscle weakness (generalized)  Other symptoms and signs involving the musculoskeletal system  Other symptoms and signs involving the nervous system  Other lack of coordination  Stiffness of left shoulder, not elsewhere classified  Rationale for Evaluation and Treatment: Rehabilitation  SUBJECTIVE:   SUBJECTIVE  STATEMENT: Pt denies pain  Pt accompanied by:  dtr  PERTINENT HISTORY: Parkinson's Disease, last seen by OT 12/2021 (recommended 6 month re-evaluation at OT d/c)  PMH:  Osteopenia, glaucoma, prediabetes, Vitamin D deficiency, anemia due to B12 deficiency, bradycardia    PRECAUTIONS: Fall  WEIGHT BEARING RESTRICTIONS: No  PAIN:  Are you having pain? No  FALLS: Has patient fallen in last 6 months? Yes. Number of falls 2  at granddaughter's dance practice  LIVING ENVIRONMENT: Lives with: lives with their family and lives with their daughter Lives in: House/apartment Stairs: No Has following equipment at home: Grab bars  PLOF: Independent, no longer driving  PATIENT GOALS:improve balance, coordination   OBJECTIVE:   HAND DOMINANCE: Right  ADLs: Overall ADLs: difficulty sequencing at times, uses checklist prior to leaving house Transfers/ambulation related to ADLs: Eating: difficulty cutting food with fork/knife, some difficulty opening bottles/packages Grooming: mod I UB Dressing: difficulty getting shirts over head, difficulty putting earrings in at times LB Dressing: slide in shoes most of the time (can tie) Toileting: some difficulty with sitting down on toilet, some urgency, forgetting to flush Bathing: Washes at the sink in standing   Tub Shower transfers: washes at the sink   IADLs: Light housekeeping: assists daughter  Meal Prep: assists daughter with tasks Community mobility: no longer drives Medication management: dtr supervises/assists Handwriting:  illegible at time per pt  MOBILITY STATUS: Independent and Freezing  POSTURE COMMENTS:  rounded shoulders, forward head, and weight shift left  FUNCTIONAL OUTCOME MEASURES: Physical performance test: PPT#2 (simulated eating) Not tested & PPT#4 (donning/doffing jacket): 24sec  COORDINATION: 9 Hole Peg test: Right: 30.53 sec; Left: 47 sec Box and Blocks:  Right 43blocks, Left 42blocks bradykinesia noted  LUE  UE ROM:  BUEs grossly  WFL (approx 140* shoulder flex with elbow ext bilaterally)  UE MMT:   Not tested  SENSATION: Not tested  MUSCLE TONE: RUE: Within functional limits and LUE: Within functional limits  COGNITION: Overall cognitive status: Impaired  Pt with impaired memory, decr awareness of postural changes, bradyphrenia, and difficulty sequencing at times per dtr  OBSERVATIONS: Bradykinesia, flat affect, decr eye contact and head turns   TODAY'S TREATMENT:                                                                                                                               PWR! Basic 4 in sitting 10 reps each min v.c for amplitude  Seated functional reaching with trunk rotation to place and remove graded clothespins from vertical antennae, min v.c for upright posture  Practiced donning/doffing jacket using large amplitude movement strategies then donning/ doffing and zipping light sweater jacket, min A with zipper and min v.c for amplitude.  Min cues for large amplitude movements.  Simulated ADLs with bag with focus/min cues for large amplitude movements:  Donning/doffing pull-over shirt, pulling bag into hand for clothing adjustment/donning shirt/ donning socks, simulated donning pants-under each foot then passing behind back as if tucking in shirt, 10 reps each min v.c       PATIENT EDUCATION: Education details: PWR! Basic 4,  10 reps each Person educated: Patient and Child(ren) Education method: Explanation, Demonstration, Tactile cues, and Verbal cues, handout Education comprehension: verbalized understanding, returned demonstration, verbal cues required  HOME EXERCISE PROGRAM: 08/23/22- PWR1 basic 4 in seated  GOALS: Potential Goals reviewed with patient? Yes  SHORT TERM GOALS: Target date: 08/25/22  Pt will be independent with updated HEP. Goal status: INITIAL  2.  Pt will report incr ease with donning pull-over shirt without assist  consistently. Goal status: INITIAL  3.  Pt will verbalize understanding of ways to prevent future PD related complications and appropriate community resources. Goal status: INITIAL  4.  Pt will be able to write at least 2 sentences with good legibility and size. Goal status: MET 07/28/22   LONG TERM GOALS: Target date: 09/23/22  Pt/caregiver will verbalize understanding of adaptive strategies/AE to incr safety and ease with ADLs/IADLs. Goal status: INITIAL  2.  Pt will don/doff jacket in less than 20sec for incr ease using strategies prn. Baseline:  24sec Goal status: INITIAL  3.  Pt will improve L hand coordination for ADLs/IADLs as shown by decr time on 9-hole peg test by at least 8sec.  Baseline:  47sec Goal status: INITIAL  4.  Pt will improve functional reaching/coordination as shown by improving score on box and blocks test by at least 4 bilaterally. Baseline: R-43, L-42 Goal status: INITIAL  5.  Pt/dtr will verbalize understanding of cognitive compensation strategies for ADLs/IADLs. Goal status: INITIAL   ASSESSMENT:  CLINICAL IMPRESSION: Pt progressing towards goals.  Pt demo improving  large amplitude movement strategies with ADLS. Pt demonstrates understanding of PWR! Basic 4 seated.    PERFORMANCE DEFICITS: in functional skills including ADLs, IADLs, coordination, dexterity, Fine motor control, mobility, balance, decreased knowledge of use of DME, and UE functional use, cognitive skills including memory, safety awareness, and sequencing, and psychosocial skills including environmental adaptation and routines and behaviors.   IMPAIRMENTS: are limiting patient from ADLs, IADLs, and leisure.   COMORBIDITIES:  may have co-morbidities  that affects occupational performance. Patient will benefit from skilled OT to address above impairments and improve overall function.  MODIFICATION OR ASSISTANCE TO COMPLETE EVALUATION: Min-Moderate modification of tasks or assist with  assess necessary to complete an evaluation.  OT OCCUPATIONAL PROFILE AND HISTORY: Detailed assessment: Review of records and additional review of physical, cognitive, psychosocial history related to current functional performance.  CLINICAL DECISION MAKING: Moderate - several treatment options, min-mod task modification necessary  REHAB POTENTIAL: Good  EVALUATION COMPLEXITY: Moderate   PLAN:  OT FREQUENCY: 2x/week  OT DURATION: 8 weeks +eval  PLANNED INTERVENTIONS: self care/ADL training, therapeutic exercise, therapeutic activity, neuromuscular re-education, manual therapy, passive range of motion, balance training, functional mobility training, splinting, paraffin, fluidotherapy, moist heat, cryotherapy, patient/family education, cognitive remediation/compensation, and DME and/or AE instructions  RECOMMENDED OTHER SERVICES: current with PT, ST  CONSULTED AND AGREED WITH PLAN OF CARE: Patient and family Midwife (dtr)  PLAN FOR NEXT SESSION:   review ways to decr risk of future complications, ADL strategies, handwriting   Tacora Athanas, OTR/L 08/23/2022, 3:08 PM

## 2022-08-23 NOTE — Therapy (Signed)
OUTPATIENT PHYSICAL THERAPY NEURO TREATMENT/10th VISIT PN   Patient Name: Denise [name] MRN: [id number] DOB:Aug 18, 1950, 72 y.o., female Today's Date: 08/23/2022   PCP: Minette Brine, FNP  REFERRING PROVIDER: William Dalton, MD    10th Visit Physical Therapy Progress Note  Dates of Reporting Period: 07/13/22 to 08/23/22    END OF SESSION:  PT End of Session - 08/23/22 1411     Visit Number 10    Number of Visits 17    Date for PT Re-Evaluation 09/12/22    Authorization Type BCBS    PT Start Time 1409   pt in restroom at start of session   PT Stop Time 1447    PT Time Calculation (min) 38 min    Equipment Utilized During Treatment Gait belt    Activity Tolerance Patient tolerated treatment well    Behavior During Therapy WFL for tasks assessed/performed                   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   Past Surgical History:  Procedure Laterality Date   TUBAL LIGATION     Patient Active Problem List   Diagnosis Date Noted   Bradycardia 01/17/2022   Parkinson's disease 02/17/2020   Gait abnormality 12/04/2019   Prediabetes 12/03/2018   Vitamin D deficiency 12/03/2018   Anemia due to vitamin B12 deficiency 12/03/2018   Fatigue 12/03/2018   Vitamin B12 deficiency 10/01/2018   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 No changes. Has been feeling more steady with rollator. Reports things have been going well at home.   Pt accompanied by: family member daughter   PERTINENT HISTORY: PD, glaucoma, osteoporosis (newly diagnosed), TB (as a child)    PAIN:  Are you having pain? No  PRECAUTIONS: Fall  and Other: Osteoporosis  WEIGHT BEARING RESTRICTIONS: No  FALLS: Has patient fallen in last 6 months? Yes. Number of falls 2  LIVING ENVIRONMENT: Lives with: lives with their family - daughter and granddaughter  Lives in: House/apartment Stairs: Yes: External: 2 steps; none Has following equipment at home: Single point cane and Grab bars  PLOF: Independent, pt's daughter reports sometimes pt needs help with dressing   PATIENT GOALS: See how she was since the last time she was here. Work on the balance   OBJECTIVE:  TODAY'S TREATMENT:     Ther Ex  SciFit multi-peaks level 2.5 for 8 minutes using BUE/BLEs for neural priming for reciprocal movement, dynamic cardiovascular conditioning and increased amplitude of stepping. Pt reporting RPE as 7/10. Pt able to keep spm above 90 throughout    NMR  In // bars, stepping over smaller orange obstacles with step to pattern with leading with each RLE and LLE down and back x2 reps to work on SLS, weight shifting, and incr foot clearance. Pt needing cues for incr effort when stepping over with LLE as it had a tendency to hit the obstacle at times. Progressed to larger orange obstacles down and back x3 reps, pt more challenged with stepping over with LLE, but able to improve when thinking about incr effort of lifting up leg, although pt did hit obstacles at times. Pt needing intermittent UE support for balance, last couple of reps focused on incr speed when stepping over  Alternating forward stepping with ipsilateral scarf toss  for larger amplitude movements, stepping strategy, cued for tall posture when tossing and extending arm/opening up hands, performed 10 reps bilat, with pt having more difficulty with balance when having to step back with RLE, min guard as needed for balance. Added cognitive challenge by pt naming animals, pt with incr difficulty with cognitive dual tasking  With pt at edge of mat table and PT tech posterior to pt providing black t band resistance at pt's anterior pelvis: forward gait while ambulating against resistance with cued for posture and step length (taking a few steps forward and then slowly taking steps backwards and trying not to have tband pull her backwards), performed x5 reps total. Pt challenged walking forwards against resistance.   Needing initial reminders for proper brake management with rollator.   PATIENT EDUCATION: Education details: Continue HEP Person educated: Patient and daughter  Education method: Explanation, Demonstration, and Verbal cues Education comprehension: verbalized understanding, returned demonstration, and needs further education  HOME EXERCISE PROGRAM: From previous bout of therapy: Will review/updated as needed.   Standing and seated PWR, Pt has in her exercise folder.     Access Code: [email or web address] URL: [email or web address] Date: 10/19/2021 Prepared by: [name]   Reviewed/condensed exercises for balance:    Exercises - Alternating Step Backward with Support  - 1 x daily - 5 x weekly - 1-2 sets - 10 reps - Romberg Stance Eyes Closed on Foam Pad  - 1 x daily - 5 x weekly - 3 sets - 30 hold   Plus standing tall against the wall for posture - 2 x 30 second holds    GOALS: Goals reviewed with patient? Yes  SHORT TERM GOALS: ALL STGS = LTGS   LONG TERM GOALS: Target date: 09/08/2022 (updated for extended POC)   Pt will be independent with final PD HEP for improved strength, balance, transfers, and gait  Baseline:  Goal status:  IN PROGRESS  2.  Pt will improve miniBEST to at least  a 22/28 in order to demo decr fall risk Baseline: 19/28; 21/28 on 2/8 Goal status: IN PROGRESS  3.  Pt will improve gait speed with no AD vs. LRAD to at least 2.7 ft/sec in order to demo improved community mobility.   Baseline: 14.63 seconds with no AD = 2.24 ft/sec  Goal status: INITIAL  4.  Pt will improve manual TUG time to 14.5 seconds or less in order to demo decrease fall risk Baseline: 16.63 seconds; 15.52s on 2/8 Goal status: IN PROGRESS  5.  Pt will improve 5x sit<>stand to less than or equal to 14 sec to demonstrate improved functional strength and transfer efficiency. Baseline: 15.9 seconds with no UE support  Goal status: INITIAL  6.  Pt will verbalize and demonstrate understanding of fall prevention in the home/community (use of appropriate AD, backing up into chairs, freezing strategies) Baseline:  Goal status: INITIAL  ASSESSMENT:  CLINICAL IMPRESSION: 10th visit PN: LTGs assessed on 08/11/22, with LTGs #2 and #4 in progress. Pt improved miniBEST to a 21/28 (previously was 19/28) and improved manual TUG time from 16.6 > 15.5 seconds. Pt has now been using a rollator out in the community to help with pt's balance. Today's skilled session focused on reciprocal larger amplitude movements/aerobic exercise with SciFit with pt able to keep spm above the 90s throughout and standing balance tasks working on SLS/stepping. With SLS activities, pt more challenged with LLE foot clearance and had tendency to hit it at times, when thinking about incr effort when stepping over, pt able to demo with improved foot clearance and balance. Will continue to progress towards LTGs.   OBJECTIVE IMPAIRMENTS: Abnormal gait, decreased activity tolerance, decreased balance, decreased cognition, decreased coordination, decreased knowledge of use of DME, decreased mobility, difficulty walking, decreased strength, decreased safety awareness, impaired  flexibility, impaired tone, and postural dysfunction.   ACTIVITY LIMITATIONS: carrying, lifting, bending, stairs, transfers, dressing, and locomotion level  PARTICIPATION LIMITATIONS: meal prep, cleaning, driving, shopping, and community activity  PERSONAL FACTORS: Age, Behavior pattern, Past/current experiences, Time since onset of injury/illness/exacerbation, and 3+ comorbidities: PD, glaucoma, osteoporosis (newly diagnosed), TB (as a child)    are also affecting patient's functional outcome.   REHAB POTENTIAL: Good  CLINICAL DECISION MAKING: Evolving/moderate complexity  EVALUATION COMPLEXITY: Moderate  PLAN:  PT FREQUENCY: 2x/week  PT DURATION: 8 weeks (anticipate maybe needing more than 4 weeks when pt initially got scheduled)  PLANNED INTERVENTIONS: Therapeutic exercises, Therapeutic activity, Neuromuscular re-education, Balance training, Gait training, Patient/Family education, Self Care, Stair training, Vestibular training, Visual/preceptual remediation/compensation, DME instructions, Manual therapy, and Re-evaluation  PLAN FOR NEXT SESSION:  Continue to work on gait with cane and rollator - trying obstacles. Review HEP and update as needed. Posture awareness as pt with heavy lean to L. Lateral stepping strategies/weight shifting.Continue ball kicks on TM. SLS tasks. Resisted gait.    Arliss Journey, PT, DPT  08/23/2022, 2:51 PM

## 2022-08-25 ENCOUNTER — Encounter: Payer: Medicare Other | Admitting: Speech Pathology

## 2022-08-25 ENCOUNTER — Ambulatory Visit: Payer: Medicare Other | Admitting: Speech Pathology

## 2022-08-25 ENCOUNTER — Ambulatory Visit: Payer: Medicare Other | Admitting: Physical Therapy

## 2022-08-25 NOTE — Therapy (Deleted)
OUTPATIENT SPEECH LANGUAGE PATHOLOGY [facility]   Patient Name: Denise [name] MRN: [id number] DOB:01/27/1951, 72 y.o., female Today's Date: 08/16/2022  PCP: Minette Brine FNP REFERRING PROVIDER: Thenganatt   END OF SESSION:  End of Session - 08/16/22 1532     Visit Number 7    Number of Visits 17    Date for SLP Re-Evaluation 09/07/22    Authorization Type BC[employer]    SLP Start Time 1445    SLP Stop Time  1531    SLP Time Calculation (min) 46 min    Activity Tolerance Patient tolerated treatment well             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   Past Surgical History:  Procedure Laterality Date   TUBAL LIGATION     Patient Active Problem List   Diagnosis Date Noted   Bradycardia 01/17/2022   Parkinson's disease 02/17/2020   Gait abnormality 12/04/2019   Prediabetes 12/03/2018   Vitamin D deficiency 12/03/2018   Anemia due to vitamin B12 deficiency 12/03/2018   Fatigue 12/03/2018   Vitamin B12 deficiency 10/01/2018   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 No  PATIENT GOALS: improve confidence while speaking  OBJECTIVE:   TODAY'S TREATMENT:                                                                                                                                         08-25-22:  08-16-22: Pt accompanied by daughter.  "Ah"- 79  dB Counting- 72-73 dB Reading- 68-70 dB Cognitive- 68 dB Conversation- 68 dB Pt reported that she continues to have moments of reduced volume. Independently uses environment to help determine if she's loud enough, such as if other people passing by can hear her when she greets them. Reported that her daughter will reminder her to speak louder when she does not self correct.  SLP provided occasional min verbal cues to maintain adequate volume across cognitive exercise. Pt self-corrected  x1 without cues during 15 minute conversation. Clinician provided occasional min verbal cues and x1 gestural cue.   08-11-22: Pt entered session averaging above 70 dB.  "Ah"- 77 dB (very consistent, even volume across trials) Counting- 72 dB Reading- 70 dB  Cognitive- 68-70 dB (rare  verbal cues to increase volume) With rare min gestural cues, pt maintained adequate volume across 15 minute conversation in noisy environment (radio on and clinician sitting across the room). SLP able to determine intended message in 100% of trials.   Daughter Lattie Haw) helps her with calendar so pt can keep track of schedule. Has spreadsheets for calendar, medication and appointments.   08-04-22: Opening conversation averaged mid 60s dB. Optimized patient volume and intensity throughout Speak Out! Program. Rare min A required to achieve targeted dB levels. Conversation of recent event averaged 70 dB with occasional min A to maintain targeted volume. Plan to target conversational loudness outside of ST room in upcoming ST sessions. Good carryover of recommended memory compensations reported (checklists and schedules) with benefit reported by patient and family.   07-28-22: Pt accompanied by family members. Averaged 63 dB at start of session. SLP led pt through Speak Out! Lesson 9 and discussed the importance of HEP to warm up her voice. Provided model, pt demonstrated significant improvement in volume at start of warm up. Provided re-education  concerning importance of intent. Pt brought notes from a panel she plans to speak out on Saturday. Clinician utilized presented notes to implement Speak Out! Strategies. Pt required consistent fading to intermittent verbal and visual cues to increase volume during conversational task. SLP encouraged pt to implement her own visual cues to improve speech volume. At start and end of session, SLP requested pt repeat list of items she needs to remember when she leaves the house. Provided education on using visual aids at home to aid recall of pertinent items.   Ah average: 88 dB Conversation average: 68 dB    07-26-22: SLP provided education on potential metrics which may aid in improved carryover of intentional speech and increased vocal intensity. Encouraged pt to think about perceived effort level, reading facial expression of conversational partner, noting when being ask to repeat, making self-inquiry as to whether pt is channeling her "preacher" voice. Education on for improved communication efficacy, pt should not rely on using her "normal" voice. For improved volume, she needs to avoid what feels normal and speak with more effort/volume. Generated visual cue for hand to ear to aid in cueing from daughter when she does not want to interrupt in middle of speech for verbal cue. Pt with her questions for panel, SLP provided aid for generating key points Dusty wants to relay in answering questions on panel. SLP asked pt to provided brief introduction to practice- pt with reduced intensity (64 dB) and usual extended pauses. SLP encourages pt to write key points and practice to mitigate complexities of public speaking task.   07-19-22: Generated strategy and visual aid to aid recall of necessary items for departure. With structured repetitive practice, pt able to recall 5 needed items with mod I by end of session. Provided instructions and visual aid to implement at home. Pt continues with online Speak Out!  Lessons daily. Conversational volume initially averaged mid 60s dB.Targeted improving vocal quality and increasing intensity through progressively difficulty speech tasks using Speak Out! program, lesson 1 review. ST leads pt through exercises providing occasional model prior to pt execution. occasional min-A required to achieve target dB this date. Averages this date: loud "ah" 86 dB; reading 77 dB; cognitive speech task 73 dB. Conversational sample of approx 5 minutes, pt averages low 70s dB with occasional min-A.  PATIENT EDUCATION: Education details: see above Person educated: Patient and Child(ren) Education method: Customer service manager Education comprehension: verbalized understanding,  returned demonstration, and needs further education  HOME EXERCISE PROGRAM: Speak Out! Program    GOALS: Goals reviewed with patient? Yes  SHORT TERM GOALS: Target date: 08/10/2022  Pt will achieve targeted dB levels in demonstration of Speak Out! Lessons with 90% accuracy given rare min A over 2 sessions  Baseline: 08/04/22 Goal status: MET  2.  Pt will utilize dysarthria compensations in 5-10 minute conversation and average WNL conversational volume (70-72 dB) given occasional min A over 2 sessions   Baseline: 08/04/22 Goal status: MET  3.  Pt and family will carryover memory compensations to aid daily functioning at home (recalling needed items/daily tasks) given occasional min A over 2 sessions  Baseline: 07-28-22, 08/04/22 Goal status: MET  4.  Pt will carryover dysarthria strategies in public speaking role play to be 100% intelligible given occasional min A Baseline:  Goal status: MET  LONG TERM GOALS: Target date: 09/07/2022  Pt will utilize dysarthria compensations in 15+ minute conversation to optimize vocal intensity and clarity given rare min A over 2 sessions Baseline:  Goal status: IN PROGRESS  2.  Pt and family will carryover memory compensations to aid daily functioning at home  (recalling needed items/daily tasks) with mod I  Baseline:  Goal status: IN PROGRESS  3.  Pt will subjectively report increased confidence and attend increased number of events per family baseline  Baseline:  Goal status: IN PROGRESS   ASSESSMENT:  CLINICAL IMPRESSION: Pt demonstrating good progress toward goals. SLP recommends 1x per week, pt agreeable.   OBJECTIVE IMPAIRMENTS: Objective impairments include memory and dysarthria. These impairments are limiting patient from household responsibilities and effectively communicating at home and in community.Factors affecting potential to achieve goals and functional outcome are medical prognosis.. Patient will benefit from skilled SLP services to address above impairments and improve overall function.  REHAB POTENTIAL: Good  PLAN:  SLP FREQUENCY: 2x/week  SLP DURATION: 8 weeks  PLANNED INTERVENTIONS: Language facilitation, Environmental controls, Cueing hierachy, Cognitive reorganization, Internal/external aids, Functional tasks, Multimodal communication approach, SLP instruction and feedback, Compensatory strategies, and Patient/family education    Leroy Libman, Student-SLP 08/16/2022, 3:32 PM

## 2022-08-26 NOTE — Progress Notes (Signed)
She needs to use an enema to see if it helps with a good bowel movement

## 2022-08-30 ENCOUNTER — Ambulatory Visit: Payer: Medicare Other | Admitting: Physical Therapy

## 2022-08-30 ENCOUNTER — Ambulatory Visit: Payer: Medicare Other | Admitting: Speech Pathology

## 2022-08-30 ENCOUNTER — Encounter: Payer: Self-pay | Admitting: Physical Therapy

## 2022-08-30 DIAGNOSIS — R29898 Other symptoms and signs involving the musculoskeletal system: Secondary | ICD-10-CM | POA: Diagnosis not present

## 2022-08-30 DIAGNOSIS — M25612 Stiffness of left shoulder, not elsewhere classified: Secondary | ICD-10-CM | POA: Diagnosis not present

## 2022-08-30 DIAGNOSIS — R471 Dysarthria and anarthria: Secondary | ICD-10-CM

## 2022-08-30 DIAGNOSIS — R41841 Cognitive communication deficit: Secondary | ICD-10-CM | POA: Diagnosis not present

## 2022-08-30 DIAGNOSIS — R2689 Other abnormalities of gait and mobility: Secondary | ICD-10-CM

## 2022-08-30 DIAGNOSIS — R29818 Other symptoms and signs involving the nervous system: Secondary | ICD-10-CM | POA: Diagnosis not present

## 2022-08-30 DIAGNOSIS — R2681 Unsteadiness on feet: Secondary | ICD-10-CM | POA: Diagnosis not present

## 2022-08-30 DIAGNOSIS — R278 Other lack of coordination: Secondary | ICD-10-CM | POA: Diagnosis not present

## 2022-08-30 DIAGNOSIS — M6281 Muscle weakness (generalized): Secondary | ICD-10-CM

## 2022-08-30 DIAGNOSIS — R293 Abnormal posture: Secondary | ICD-10-CM | POA: Diagnosis not present

## 2022-08-30 NOTE — Therapy (Signed)
OUTPATIENT SPEECH LANGUAGE PATHOLOGY [facility]   Patient Name: Denise [name] MRN: [id number] DOB:1951-01-27, 72 y.o., female Today's Date: 08/30/2022  PCP: Minette Brine FNP REFERRING PROVIDER: Thenganatt   END OF SESSION:  End of Session - 08/30/22 1535     Visit Number 8    Number of Visits 17    Date for SLP Re-Evaluation 09/07/22    Authorization Type [employer]    SLP Start Time 1530    SLP Stop Time  [id number]    SLP Time Calculation (min) 45 min    Activity Tolerance Patient tolerated treatment well             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   Past Surgical History:  Procedure Laterality Date   TUBAL LIGATION     Patient Active Problem List   Diagnosis Date Noted   Bradycardia 01/17/2022   Parkinson's disease 02/17/2020   Gait abnormality 12/04/2019   Prediabetes 12/03/2018   Vitamin D deficiency 12/03/2018   Anemia due to vitamin B12 deficiency 12/03/2018   Fatigue 12/03/2018   Vitamin B12 deficiency 10/01/2018   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 No  PATIENT GOALS: improve confidence while speaking  OBJECTIVE:   TODAY'S TREATMENT:                                                                                                                                         08-25-22: Pt walked in  with reduced volume of 68 dB and under, reporting she was tired. SLP reviewed the principles of Speak out! and the meaning of speaking with intent. Pt discussed difficulty word-finding and demonstrated knowledge of anomia strategies that she implements independently.  Pt completed Speak out lesson 21.    "Ah"- 78-80 dB  Counting- 72-74 dB   Reading- 70 dB  Clinician instructed pt in modified Semantic Feature Analysis (SFA). During structured SFA task, pt required rare min verbal cues to maintain adequate conversational volume. Pt successful in communicating target word through modified SFA in 6/6 opportunities with occasional verbal prompts to elaborate description.   08-16-22: Pt accompanied by daughter.  "Ah"- 79 dB Counting- 72-73 dB Reading- 68-70 dB Cognitive- 68 dB Conversation- 68 dB Pt  reported that she continues to have moments of reduced volume. Independently uses environment to help determine if she's loud enough, such as if other people passing by can hear her when she greets them. Reported that her daughter will reminder her to speak louder when she does not self correct.  SLP provided occasional min verbal cues to maintain adequate volume across cognitive exercise. Pt self-corrected  x1 without cues during 15 minute conversation. Clinician provided occasional min verbal cues and x1 gestural cue.   08-11-22: Pt entered session averaging above 70 dB.  "Ah"- 77 dB (very consistent, even volume across trials) Counting- 72 dB Reading- 70 dB  Cognitive- 68-70 dB (rare verbal cues to increase volume) With rare min gestural cues, pt maintained adequate volume across 15 minute conversation in noisy environment (radio on and clinician sitting across the room). SLP able to determine intended message in 100% of trials.   Daughter Lattie Haw) helps her with calendar so pt can keep track of schedule. Has spreadsheets for calendar, medication and appointments.   08-04-22: Opening conversation  averaged mid 60s dB. Optimized patient volume and intensity throughout Speak Out! Program. Rare min A required to achieve targeted dB levels. Conversation of recent event averaged 70 dB with occasional min A to maintain targeted volume. Plan to target conversational loudness outside of ST room in upcoming ST sessions. Good carryover of recommended memory compensations reported (checklists and schedules) with benefit reported by patient and family.   PATIENT EDUCATION: Education details: see above Person educated: Patient and Child(ren) Education method: Customer service manager Education comprehension: verbalized understanding, returned demonstration, and needs further education  HOME EXERCISE PROGRAM: Speak Out! Program    GOALS: Goals reviewed with patient? Yes  SHORT TERM GOALS: Target date: 08/10/2022  Pt will achieve targeted dB levels in demonstration of Speak Out! Lessons with 90% accuracy given rare min A over 2 sessions  Baseline: 08/04/22 Goal status: MET  2.  Pt will utilize dysarthria compensations in 5-10 minute conversation and average WNL conversational volume (70-72 dB) given occasional min A over 2 sessions   Baseline: 08/04/22 Goal status: MET  3.  Pt and family will carryover memory compensations to aid daily functioning at home (recalling needed items/daily tasks) given occasional min A over 2 sessions  Baseline: 07-28-22, 08/04/22 Goal status: MET  4.  Pt will carryover dysarthria strategies in public speaking role play to be 100% intelligible given occasional min A Baseline:  Goal status: MET  LONG TERM GOALS: Target date: 09/07/2022  Pt will utilize dysarthria compensations in 15+ minute conversation to optimize vocal intensity and clarity given rare min A over 2 sessions Baseline:  Goal status: MET  2.  Pt and family will carryover memory compensations to aid daily functioning at home (recalling needed items/daily tasks) with mod I  Baseline:  Goal status:  IN PROGRESS  3.  Pt will subjectively report increased confidence and attend increased number of events per family baseline  Baseline:  Goal status: IN PROGRESS   ASSESSMENT:  CLINICAL IMPRESSION: Pt with good carryover of dysarthria strategies and compensations, although continues to experience reduced volume during moments of fatigue. SLP reviewed principles of Speak Out program this date. Pt expressed that she occasionally experiences word-finding difficulties.Provided education on anomia compensations and strategies. Pt verbalized understanding. 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    OBJECTIVE IMPAIRMENTS: Objective impairments include memory and dysarthria. These impairments are limiting patient from household responsibilities and effectively communicating at home and  in community.Factors affecting potential to achieve goals and functional outcome are medical prognosis.. Patient will benefit from skilled SLP services to address above impairments and improve overall function.  REHAB POTENTIAL: Good  PLAN:  SLP FREQUENCY: 2x/week  SLP DURATION: 8 weeks  PLANNED INTERVENTIONS: Language facilitation, Environmental controls, Cueing hierachy, Cognitive reorganization, Internal/external aids, Functional tasks, Multimodal communication approach, SLP instruction and feedback, Compensatory strategies, and Patient/family education    Leroy Libman, Student-SLP 08/30/2022, 3:58 PM

## 2022-08-30 NOTE — Therapy (Signed)
OUTPATIENT PHYSICAL THERAPY NEURO TREATMENT   Patient Name: Denise [name] MRN: [id number] DOB:08-Jan-1951, 72 y.o., female Today's Date: 08/30/2022   PCP: Minette Brine, [place]  REFERRING PROVIDER: William Dalton, MD    10th Visit Physical Therapy Progress Note  Dates of Reporting Period: 07/13/22 to 08/23/22    END OF SESSION:  PT End of Session - 08/30/22 1456     Visit Number 11    Number of Visits 17    Date for PT Re-Evaluation 09/12/22    Authorization Type [employer]    PT Start Time 1452   pt late to session   PT Stop Time 1531    PT Time Calculation (min) 39 min    Equipment Utilized During Treatment Gait belt    Activity Tolerance Patient tolerated treatment well    Behavior During Therapy WFL for tasks assessed/performed                   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   Past Surgical History:  Procedure Laterality Date   TUBAL LIGATION     Patient Active Problem List   Diagnosis Date Noted   Bradycardia 01/17/2022   Parkinson's disease 02/17/2020   Gait abnormality 12/04/2019   Prediabetes 12/03/2018   Vitamin D deficiency 12/03/2018   Anemia due to vitamin B12 deficiency 12/03/2018   Fatigue 12/03/2018   Vitamin B12 deficiency 10/01/2018   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 No changes. Got food poisoning last week.   Pt accompanied by: family member daughter   PERTINENT HISTORY: PD, glaucoma, osteoporosis (newly diagnosed), TB (as a child)    PAIN:  Are you having pain? No  PRECAUTIONS: Fall and Other: Osteoporosis  WEIGHT BEARING RESTRICTIONS: No  FALLS: Has patient fallen in last  6 months? Yes. Number of falls 2  LIVING ENVIRONMENT: Lives with: lives with their family - daughter and granddaughter  Lives in: House/apartment Stairs: Yes: External: 2 steps; none Has following equipment at home: Single point cane and Grab bars  PLOF: Independent, pt's daughter reports sometimes pt needs help with dressing   PATIENT GOALS: See how she was since the last time she was here. Work on the balance   OBJECTIVE:  TODAY'S TREATMENT:        NMR  With use of mirror in front of pt for visual feedback for upright posture/midline orientation (pt with trunk and head lean to the L) PWR! Up for improved posture in sitting x20 reps   Pt performs sit <> stand on air ex and in standing performing PWR Up for improved posture x20 reps  Pt reporting RPE of 7/10 when performing    With one PT providing resistance with resisted band and 2nd PT providing min guard/min A, worked on resisted gait with no AD x115' with cues for posture, arm swing, and stride length. First lap provided with steady resistance, with pt initially after difficulty. Performed an additional 59' with PT providing random perturbations during gait with pt having to use stepping strategies in order to maintain balance. Pt most challenged when going to the L, needing min A at times for balance. However, pt with improved reactive stepping to the L with incr reps   In // bars on rockerboard in A/P direction in front of mirror for improved postural awareness/weight shifting, and balance  strategies: Keeping board steady 2 x 30 seconds, with focus on posture and midline orientation, pt with difficulty shifting trunk to the R and tends to hold in more L truncal lean with balance challenge  Alternating posterior stepping off board x12 reps each leg, pt performing with UE support, when attempting to perform without, pt unable to adequately shift weight and step foot off and pt with incr fear when trying to perform.  With PT holding target to pt's R side and superiorly, worked on reaching ipsilateral hand to try to touch target for balance, trunk elongation, and weight shifting with cued to turn head when reaching, pt taking incr time to perform and decr ROM initially when reaching with pt needing intermittent UE support, performed x5 reps going to R and x5 reps going to the L. Pt with more difficulty reaching to the L side   With gait during session with rollator and with no AD, cued for tall posture and stride length.   PATIENT EDUCATION: Education details: Continue HEP, importance of working on tall posture at home.  Person educated: Patient and daughter  Education method: Explanation, Demonstration, and Verbal cues Education comprehension: verbalized understanding, returned demonstration, and needs further education  HOME EXERCISE PROGRAM: From previous bout of therapy: Will review/updated as needed.   Standing and seated PWR, Pt has in her exercise folder.     Access Code: [email or web address] URL: [email or web address] Date: 10/19/2021 Prepared by: [name]   Reviewed/condensed exercises for balance:    Exercises - Alternating Step Backward with Support  - 1 x daily - 5 x weekly - 1-2 sets - 10 reps - Romberg Stance Eyes Closed on Foam Pad  - 1 x daily - 5 x weekly - 3 sets - 30 hold   Plus standing tall against the wall for posture - 2 x 30 second holds    GOALS: Goals reviewed with patient? Yes  SHORT TERM GOALS: ALL STGS =  LTGS   LONG TERM GOALS:  Target date: 09/08/2022 (updated for extended POC)   Pt will be independent with final PD HEP for improved strength, balance, transfers, and gait  Baseline:  Goal status: IN PROGRESS  2.  Pt will improve miniBEST to at least a 22/28 in order to demo decr fall risk Baseline: 19/28; 21/28 on 2/8 Goal status: IN PROGRESS  3.  Pt will improve gait speed with no AD vs. LRAD to at least 2.7 ft/sec in order to demo improved community mobility.   Baseline: 14.63 seconds with no AD = 2.24 ft/sec  Goal status: INITIAL  4.  Pt will improve manual TUG time to 14.5 seconds or less in order to demo decrease fall risk Baseline: 16.63 seconds; 15.52s on 2/8 Goal status: IN PROGRESS  5.  Pt will improve 5x sit<>stand to less than or equal to 14 sec to demonstrate improved functional strength and transfer efficiency. Baseline: 15.9 seconds with no UE support  Goal status: INITIAL  6.  Pt will verbalize and demonstrate understanding of fall prevention in the home/community (use of appropriate AD, backing up into chairs, freezing strategies) Baseline:  Goal status: INITIAL  ASSESSMENT:  CLINICAL IMPRESSION: Today's skilled session focused on posture exercises in sitting/standing with PWR Up with use of mirror as visual feedback for midline orientation as pt with incr trunk lean to the L. Pt does well with use of visual cues for midline orientation, but pt challenged with midline orientation when doing balance tasks on the rockerboard. Worked on resisted gait for incr amplitude gait training and with random perturbations, pt initially more challenged when being pulled to the L, needing min A, but with incr reps pt able to regain balance using stepping strategies. Will continue per POC.  OBJECTIVE IMPAIRMENTS: Abnormal gait, decreased activity tolerance, decreased balance, decreased cognition, decreased coordination, decreased knowledge of use of DME, decreased mobility, difficulty walking, decreased  strength, decreased safety awareness, impaired flexibility, impaired tone, and postural dysfunction.   ACTIVITY LIMITATIONS: carrying, lifting, bending, stairs, transfers, dressing, and locomotion level  PARTICIPATION LIMITATIONS: meal prep, cleaning, driving, shopping, and community activity  PERSONAL FACTORS: Age, Behavior pattern, Past/current experiences, Time since onset of injury/illness/exacerbation, and 3+ comorbidities: PD, glaucoma, osteoporosis (newly diagnosed), TB (as a child)    are also affecting patient's functional outcome.   REHAB POTENTIAL: Good  CLINICAL DECISION MAKING: Evolving/moderate complexity  EVALUATION COMPLEXITY: Moderate  PLAN:  PT FREQUENCY: 2x/week  PT DURATION: 8 weeks (anticipate maybe needing more than 4 weeks when pt initially got scheduled)  PLANNED INTERVENTIONS: Therapeutic exercises, Therapeutic activity, Neuromuscular re-education, Balance training, Gait training, Patient/Family education, Self Care, Stair training, Vestibular training, Visual/preceptual remediation/compensation, DME instructions, Manual therapy, and Re-evaluation  PLAN FOR NEXT SESSION:  Continue to work on gait with cane and rollator - trying obstacles. Review HEP and update as needed. Posture awareness as pt with heavy lean to L. Lateral stepping strategies/weight shifting.Continue ball kicks on TM. SLS tasks. Resisted gait.    Arliss Journey, PT, DPT  08/30/2022, 3:34 PM

## 2022-08-31 ENCOUNTER — Encounter: Payer: Self-pay | Admitting: Nurse Practitioner

## 2022-09-01 ENCOUNTER — Encounter: Payer: Medicare Other | Admitting: Speech Pathology

## 2022-09-01 ENCOUNTER — Ambulatory Visit: Payer: Medicare Other | Admitting: Physical Therapy

## 2022-09-01 ENCOUNTER — Other Ambulatory Visit: Payer: Medicare Other

## 2022-09-01 DIAGNOSIS — R29818 Other symptoms and signs involving the nervous system: Secondary | ICD-10-CM | POA: Diagnosis not present

## 2022-09-01 DIAGNOSIS — R2681 Unsteadiness on feet: Secondary | ICD-10-CM | POA: Diagnosis not present

## 2022-09-01 DIAGNOSIS — D519 Vitamin B12 deficiency anemia, unspecified: Secondary | ICD-10-CM | POA: Diagnosis not present

## 2022-09-01 DIAGNOSIS — R293 Abnormal posture: Secondary | ICD-10-CM

## 2022-09-01 DIAGNOSIS — R29898 Other symptoms and signs involving the musculoskeletal system: Secondary | ICD-10-CM | POA: Diagnosis not present

## 2022-09-01 DIAGNOSIS — M6281 Muscle weakness (generalized): Secondary | ICD-10-CM | POA: Diagnosis not present

## 2022-09-01 DIAGNOSIS — R2689 Other abnormalities of gait and mobility: Secondary | ICD-10-CM | POA: Diagnosis not present

## 2022-09-01 DIAGNOSIS — R471 Dysarthria and anarthria: Secondary | ICD-10-CM | POA: Diagnosis not present

## 2022-09-01 DIAGNOSIS — R41841 Cognitive communication deficit: Secondary | ICD-10-CM | POA: Diagnosis not present

## 2022-09-01 DIAGNOSIS — R278 Other lack of coordination: Secondary | ICD-10-CM | POA: Diagnosis not present

## 2022-09-01 DIAGNOSIS — M25612 Stiffness of left shoulder, not elsewhere classified: Secondary | ICD-10-CM | POA: Diagnosis not present

## 2022-09-01 NOTE — Therapy (Signed)
OUTPATIENT PHYSICAL THERAPY NEURO TREATMENT   Patient Name: Tyrielle Vallas MRN: [id number] DOB:[birth year], 72 y.o., female Today's Date: 09/01/2022   PCP: Minette Brine, FNP  REFERRING PROVIDER: Buck Mam Audrea Muscat, MD       END OF SESSION:  PT End of Session - 09/01/22 1403     Visit Number 12    Number of Visits 17    Date for PT Re-Evaluation 09/12/22    Authorization Type BCBS    PT Start Time 1401    PT Stop Time 1444    PT Time Calculation (min) 43 min    Equipment Utilized During Treatment Gait belt    Activity Tolerance Patient tolerated treatment well    Behavior During Therapy WFL for tasks assessed/performed                    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   Past Surgical History:  Procedure Laterality Date   TUBAL LIGATION     Patient Active Problem List   Diagnosis Date Noted   Bradycardia 01/17/2022   Parkinson's disease 02/17/2020   Gait abnormality 12/04/2019   Prediabetes 12/03/2018   Vitamin D deficiency 12/03/2018   Anemia due to vitamin B12 deficiency 12/03/2018   Fatigue 12/03/2018   Vitamin B12 deficiency 10/01/2018   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 No acute changes.   Pt  accompanied by: family member daughter   PERTINENT HISTORY: PD, glaucoma, osteoporosis (newly diagnosed), TB (as a child)    PAIN:  Are you having pain? No  PRECAUTIONS: Fall and Other: Osteoporosis  WEIGHT BEARING RESTRICTIONS: No  FALLS: Has patient fallen in last 6 months? Yes. Number of falls 2  LIVING ENVIRONMENT: Lives with: lives with their family - daughter and granddaughter  Lives in: House/apartment Stairs: Yes:  External: 2 steps; none Has following equipment at home: Single point cane and Grab bars  PLOF: Independent, pt's daughter reports sometimes pt needs help with dressing   PATIENT GOALS: See how she was since the last time she was here. Work on the balance   OBJECTIVE:  TODAY'S TREATMENT:       Ther Ex  SciFit multi-peaks level 6 for 8 minutes using BUE/BLEs for neural priming for reciprocal movement, dynamic cardiovascular warmup and increased amplitude of stepping. RPE of 5/10 following activity   NMR  In // bars for improved postural control, lateral weight shifting and lateral reaching:  Standing on rockerboard in L/R direction:  Standing unsupported x5 minutes using mirror for visual biofeedback on body position to promote shift to R side. Min tactile cues provided by therapist to pelvis and anterior shoulder to promote midline orientation, but pt unable to maintain. Pt required intermittent UE support to correct L lateral LOB  Progressed to PWR Up (provided by [facility] certified therapist) x15 reps for continued postural control. Pt unable to perform without LOB to R side, requiring min A to stabilize.  Standing cone rotation drill (grabbing cone with L hand and reaching it cross-body to therapist on R side) for improved reaching out of BOS and lateral weight shifting. Pt moved very slowly and cautiously and required extra encouragement to reach out of BOS by therapist. CGA throughout  Progressed to performing drill on rockerboard in A/P direction and pt unable  to perform without Min A and intermittent UE support due to posterior LOB. Pt very frustrated by this and relied on forward flexed posture to perform despite mod multimodal cues for upright posture.      PATIENT EDUCATION: Education details: Continue HEP, importance of working on tall posture at home.  Person educated: Patient and daughter  Education method: Explanation, Demonstration, and Verbal cues Education comprehension: verbalized understanding, returned demonstration, and needs further education  HOME EXERCISE PROGRAM: From previous bout of therapy: Will review/updated as needed.   Standing and seated PWR, Pt has in her exercise folder.     Access Code: [email or web address] URL: [email or web address] Date: 10/19/2021 Prepared by: [name]   Reviewed/condensed exercises for balance:    Exercises - Alternating Step Backward with Support  - 1 x daily - 5 x weekly - 1-2 sets - 10 reps - Romberg Stance Eyes Closed on Foam Pad  - 1 x daily - 5 x weekly - 3 sets - 30 hold   Plus standing tall against the wall for posture - 2 x 30 second holds    GOALS: Goals reviewed with patient? Yes  SHORT TERM GOALS: ALL STGS = LTGS   LONG TERM GOALS: Target date: 09/08/2022 (updated for extended POC)   Pt will be independent with final PD HEP for improved strength, balance, transfers, and gait  Baseline:  Goal status: IN PROGRESS  2.  Pt will improve miniBEST to at least a 22/28 in order to demo decr fall risk Baseline: 19/28; 21/28 on 2/8 Goal status: IN PROGRESS  3.  Pt will improve gait speed with no AD vs. LRAD to at least 2.7 ft/sec in order to demo improved community mobility.   Baseline: 14.63 seconds with no AD = 2.24 ft/sec  Goal status: INITIAL  4.  Pt will improve manual TUG time to 14.5 seconds or less in order to demo decrease fall risk Baseline: 16.63 seconds; 15.52s on 2/8 Goal status: IN PROGRESS  5.  Pt will improve 5x sit<>stand  to less than or equal  to 14 sec to demonstrate improved functional strength and transfer efficiency. Baseline: 15.9 seconds with no UE support  Goal status: INITIAL  6.  Pt will verbalize and demonstrate understanding of fall prevention in the home/community (use of appropriate AD, backing up into chairs, freezing strategies) Baseline:  Goal status: INITIAL  ASSESSMENT:  CLINICAL IMPRESSION: Emphasis of skilled PT session on postural control, lateral weight shifting and reaching out of BOS. Pt reports having "off" day today and became frustrated with herself during session. Pt required min tactile cues to maintain upright posture despite use of mirror for visual biofeedback. Pt unable to weight shift without use of UE support and continues to be limited by L truncal lean. Continue POC.   OBJECTIVE IMPAIRMENTS: Abnormal gait, decreased activity tolerance, decreased balance, decreased cognition, decreased coordination, decreased knowledge of use of DME, decreased mobility, difficulty walking, decreased strength, decreased safety awareness, impaired flexibility, impaired tone, and postural dysfunction.   ACTIVITY LIMITATIONS: carrying, lifting, bending, stairs, transfers, dressing, and locomotion level  PARTICIPATION LIMITATIONS: meal prep, cleaning, driving, shopping, and community activity  PERSONAL FACTORS: Age, Behavior pattern, Past/current experiences, Time since onset of injury/illness/exacerbation, and 3+ comorbidities: PD, glaucoma, osteoporosis (newly diagnosed), TB (as a child)    are also affecting patient's functional outcome.   REHAB POTENTIAL: Good  CLINICAL DECISION MAKING: Evolving/moderate complexity  EVALUATION COMPLEXITY: Moderate  PLAN:  PT FREQUENCY: 2x/week  PT DURATION: 8 weeks (anticipate maybe needing more than 4 weeks when pt initially got scheduled)  PLANNED INTERVENTIONS: Therapeutic exercises, Therapeutic activity, Neuromuscular re-education, Balance training, Gait training,  Patient/Family education, Self Care, Stair training, Vestibular training, Visual/preceptual remediation/compensation, DME instructions, Manual therapy, and Re-evaluation  PLAN FOR NEXT SESSION:  May need to add appointments. Continue to work on gait with cane and rollator - trying obstacles. Review HEP and update as needed. Posture awareness as pt with heavy lean to L. Lateral stepping strategies/weight shifting.Continue ball kicks on TM. SLS tasks. Resisted gait.    Cruzita Lederer Paulette Lynch, PT, DPT  09/01/2022, 2:46 PM

## 2022-09-02 LAB — VITAMIN B12: Vitamin B-12: 653 pg/mL (ref 232–1245)

## 2022-09-06 ENCOUNTER — Ambulatory Visit: Payer: Medicare Other | Admitting: Occupational Therapy

## 2022-09-06 ENCOUNTER — Encounter: Payer: Self-pay | Admitting: Occupational Therapy

## 2022-09-06 ENCOUNTER — Ambulatory Visit: Payer: Medicare Other | Admitting: Speech Pathology

## 2022-09-06 ENCOUNTER — Ambulatory Visit: Payer: Medicare Other | Attending: Nurse Practitioner | Admitting: Physical Therapy

## 2022-09-06 ENCOUNTER — Encounter: Payer: Self-pay | Admitting: Physical Therapy

## 2022-09-06 DIAGNOSIS — R471 Dysarthria and anarthria: Secondary | ICD-10-CM

## 2022-09-06 DIAGNOSIS — M25612 Stiffness of left shoulder, not elsewhere classified: Secondary | ICD-10-CM

## 2022-09-06 DIAGNOSIS — R41841 Cognitive communication deficit: Secondary | ICD-10-CM

## 2022-09-06 DIAGNOSIS — R29898 Other symptoms and signs involving the musculoskeletal system: Secondary | ICD-10-CM | POA: Insufficient documentation

## 2022-09-06 DIAGNOSIS — R293 Abnormal posture: Secondary | ICD-10-CM | POA: Insufficient documentation

## 2022-09-06 DIAGNOSIS — R2681 Unsteadiness on feet: Secondary | ICD-10-CM

## 2022-09-06 DIAGNOSIS — R278 Other lack of coordination: Secondary | ICD-10-CM

## 2022-09-06 DIAGNOSIS — M6281 Muscle weakness (generalized): Secondary | ICD-10-CM | POA: Insufficient documentation

## 2022-09-06 DIAGNOSIS — R29818 Other symptoms and signs involving the nervous system: Secondary | ICD-10-CM | POA: Insufficient documentation

## 2022-09-06 DIAGNOSIS — R2689 Other abnormalities of gait and mobility: Secondary | ICD-10-CM | POA: Diagnosis not present

## 2022-09-06 NOTE — Therapy (Signed)
OUTPATIENT PHYSICAL THERAPY NEURO TREATMENT/RE-CERT   Patient Name: Denise [name] MRN: [id number] DOB:Jan 14, 1951, 72 y.o., female Today's Date: 09/07/2022   PCP: Minette Brine, FNP  REFERRING PROVIDER: Buck Mam Audrea Muscat, MD     END OF SESSION:  PT End of Session - 09/06/22 1409     Visit Number 13    Number of Visits 17    Date for PT Re-Evaluation 10/05/22    Authorization Type BCBS    PT Start Time 1407   handoff with OT   PT Stop Time 1445    PT Time Calculation (min) 38 min    Equipment Utilized During Treatment Gait belt    Activity Tolerance Patient tolerated treatment well    Behavior During Therapy WFL for tasks assessed/performed                    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   Past Surgical History:  Procedure Laterality Date   TUBAL LIGATION     Patient Active Problem List   Diagnosis Date Noted   Bradycardia 01/17/2022   Parkinson's disease 02/17/2020   Gait abnormality 12/04/2019   Prediabetes 12/03/2018   Vitamin D deficiency 12/03/2018   Anemia due to vitamin B12 deficiency 12/03/2018   Fatigue 12/03/2018   Vitamin B12 deficiency 10/01/2018   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 Has noticed her balance has gotten better since return to therapy.   Pt accompanied by: family member daughter   PERTINENT HISTORY: PD, glaucoma, osteoporosis (newly diagnosed), TB (as a child)    PAIN:  Are you having pain? No  PRECAUTIONS: Fall and Other: Osteoporosis  WEIGHT BEARING RESTRICTIONS: No  FALLS: Has patient fallen in last 6 months? Yes. Number of falls 2  LIVING ENVIRONMENT:  Lives with: lives with their family - daughter and granddaughter  Lives in: House/apartment Stairs: Yes: External: 2 steps; none Has following equipment at home: Single point cane and Grab bars  PLOF: Independent, pt's daughter reports sometimes pt needs help with dressing   PATIENT GOALS: See how she was since the last time she was here. Work on the balance   OBJECTIVE:  TODAY'S TREATMENT:      Therapeutic Activity: Goal Assessment: 5x sit <> stand: 11.6 seconds without UE support   Gait speed: 13.06 seconds with no AD = 2.51 ft/sec 13.2 seconds with rollator = 2.49 ft/sec    [facility] PT Assessment - 09/06/22 1435       Mini-BESTest   Sit To Stand Normal: Comes to stand without use of hands and stabilizes independently.    Rise to Toes Moderate: Heels up, but not full range (smaller than when holding hands), OR noticeable instability for 3 s.    Stand on one leg (left) Moderate: < 20 s   2-3 seconds   Stand on one leg (right) Moderate: < 20 s   4-5 seconds   Stand on one leg - lowest score 1    Compensatory Stepping Correction - Forward Normal: Recovers independently with a single, large step (second realignement is allowed).    Compensatory Stepping Correction - Backward Normal: Recovers independently with a single, large step    Compensatory Stepping Correction - Left Lateral Moderate: Several steps to recover equilibrium   2 steps   Compensatory Stepping Correction - Right Lateral Moderate: Several steps to recover equilibrium   2 steps    Stepping Corredtion Lateral - lowest score 1    Stance - Feet together, eyes open, firm surface  Normal: 30s    Stance - Feet together, eyes closed, foam surface  Normal: 30s    Incline - Eyes Closed Normal: Stands independently 30s and aligns with gravity    Change in Gait Speed Moderate: Unable to change walking speed or signs of imbalance    Walk with head turns - Horizontal Normal: performs head turns with no change in gait speed and good balance    Walk with pivot turns Moderate:Turns with feet close SLOW (>4 steps) with good balance.    Step over obstacles Moderate: Steps over box but touches box OR displays cautious behavior by slowing gait.    Timed UP & GO with Dual Task Moderate: Dual Task affects either counting OR walking (>10%) when compared to the TUG without Dual Task.    Mini-BEST total score 21      Timed Up and Go Test   Normal TUG (seconds) 15.25    Manual TUG (seconds) 14.41    Cognitive TUG (seconds) 20    TUG Comments no AD             PATIENT EDUCATION: Education details: Provided updated community resources handout for PD, update towards goals, plan to re-cert for 1x week for 4 weeks for POC and then D/C at that time with return evals in 6 months for PD  Person educated: Patient and daughter  Education method: Explanation, Demonstration, and Verbal cues Education comprehension: verbalized understanding, returned demonstration, and needs further education  HOME EXERCISE PROGRAM: From previous bout of therapy: Will review/updated as needed.   Standing and seated PWR, Pt has in her exercise folder.     Access Code: [email or web address] URL: [email or web address] Date: 10/19/2021 Prepared by: [name]   Reviewed/condensed exercises for balance:    Exercises -  Alternating Step Backward with Support  - 1 x daily - 5 x weekly - 1-2 sets - 10 reps - Romberg Stance Eyes Closed on Foam Pad  - 1 x daily - 5 x weekly - 3 sets - 30 hold   Plus  standing tall against the wall for posture - 2 x 30 second holds    GOALS: Goals reviewed with patient? Yes  SHORT TERM GOALS: ALL STGS = LTGS   LONG TERM GOALS: Target date: 09/08/2022 (updated for extended POC)   Pt will be independent with final PD HEP for improved strength, balance, transfers, and gait  Baseline:  Goal status: IN PROGRESS  2.  Pt will improve miniBEST to at least a 22/28 in order to demo decr fall risk Baseline: 19/28; 21/28 on 2/8  21/28 on 09/06/22  Goal status: NOT MET  3.  Pt will improve gait speed with no AD vs. LRAD to at least 2.7 ft/sec in order to demo improved community mobility.   Baseline: 14.63 seconds with no AD = 2.24 ft/sec   13.06 seconds with no AD = 2.51 ft/sec 13.2 seconds with rollator = 2.49 ft/sec  Goal status: NOT MET  4.  Pt will improve manual TUG time to 14.5 seconds or less in order to demo decrease fall risk Baseline: 16.63 seconds; 15.52s on 2/8; 14.41 seconds on 09/06/23 Goal status: MET  5.  Pt will improve 5x sit<>stand to less than or equal to 14 sec to demonstrate improved functional strength and transfer efficiency. Baseline: 15.9 seconds with no UE support; 11.6 seconds without UE support on 09/06/22  Goal status: MET  6.  Pt will verbalize and demonstrate understanding of fall prevention in the home/community (use of appropriate AD, backing up into chairs, freezing strategies) Baseline:  Goal status: IN PROGRESS    LONG TERM GOALS: Target date: 10/05/22  UPDATED/ON-GOING LTGS FOR RE-CERT:    Pt will be independent with final PD and walking program HEP for improved strength, balance, transfers, and gait  Baseline:  Goal status: IN PROGRESS  2.  Pt will verbalize and demonstrate understanding of fall prevention in the home/community (use of appropriate AD, backing up into chairs, freezing strategies) Baseline:  Goal status: IN PROGRESS  3.  Pt will improve TUG time with no AD to 13.5 seconds or less in order to demo  decrease fall risk Baseline: 15.25 seconds Goal status: NEW   ASSESSMENT:  CLINICAL IMPRESSION:  Today's skilled session focused on assessing pt's LTGs for re-cert.  Pt did not meet LTGs #2 and #3. Pt's miniBEST stayed the same at 21/28 since when last assessed approx. A month ago. Pt improved gait speed with rollator/no AD to 2.49 ft/sec, indicating improved community mobility, esp with rollator. Pt met LTGs #4 and #5 in regards to manual TUG and 5x sit <> stand. Pt with significant improvement with sit <> stands without UE support compared to eval. Pt in progress with LTGs #1 and #6 in regards to fall prevention and final HEP. Pt has made progress with OPPT so far, but will continue to benefit from PT for an additional 1x week for 4 weeks to continue to address balance, gait, posture, bradykinesia, activity tolerance, functional mobility in order to improve independence and decr fall risk from PD. Pt also missed some visits due to being sick. Pt and pt's daughter in agreement with plan. LTGs updated/on-going as appropriate.     OBJECTIVE IMPAIRMENTS: Abnormal gait, decreased activity tolerance, decreased balance, decreased cognition,  decreased coordination, decreased knowledge of use of DME, decreased mobility, difficulty walking, decreased strength, decreased safety awareness, impaired flexibility, impaired tone, and postural dysfunction.   ACTIVITY LIMITATIONS: carrying, lifting, bending, stairs, transfers, dressing, and locomotion level  PARTICIPATION LIMITATIONS: meal prep, cleaning, driving, shopping, and community activity  PERSONAL FACTORS: Age, Behavior pattern, Past/current experiences, Time since onset of injury/illness/exacerbation, and 3+ comorbidities: PD, glaucoma, osteoporosis (newly diagnosed), TB (as a child)    are also affecting patient's functional outcome.   REHAB POTENTIAL: Good  CLINICAL DECISION MAKING: Evolving/moderate complexity  EVALUATION COMPLEXITY:  Moderate  PLAN:  PT FREQUENCY: 1-2x/week  PT DURATION: 4 weeks  PLANNED INTERVENTIONS: Therapeutic exercises, Therapeutic activity, Neuromuscular re-education, Balance training, Gait training, Patient/Family education, Self Care, Stair training, Vestibular training, Visual/preceptual remediation/compensation, DME instructions, Manual therapy, and Re-evaluation  PLAN FOR NEXT SESSION:  Add walking program. Continue to work on gait with cane and rollator - trying obstacles. Review HEP and begin to finalize. Posture awareness as pt with heavy lean to L. Lateral stepping strategies/weight shifting.Continue ball kicks on TM. SLS tasks. Resisted gait.    Arliss Journey, PT, DPT  09/07/2022, 10:19 AM

## 2022-09-06 NOTE — Patient Instructions (Signed)
Ways to prevent future Parkinson's related complications: 1.   Exercise regularly,  2.   Focus on BIGGER movements during daily activities- really reach overhead, straighten elbows and extend fingers 3.   When dressing or reaching for your seatbelt make sure to use your body to assist by twisting while you reach-this can help to minimize stress on the shoulder and reduce the risk of a rotator cuff tear 4.   Swing your arms when you walk! People with PD are at increased risk for frozen shoulder and swinging your arms can reduce this risk. 5. Drink plenty of water and eat a high fiber diet 6. Do NOT take your Parkinson's medication around meals (avoid taking 30 min before a meal, or 1 hour after a meal), especially protein as it blocks the absorption of the medicine and will not work effectively

## 2022-09-06 NOTE — Therapy (Signed)
, OUTPATIENT OCCUPATIONAL THERAPY PARKINSON'S TREATMENT  Patient Name: Denise [name] MRN: [id number] DOB:[birth year], 72 y.o., female Today's Date: 09/06/2022  PCP: Minette Brine, FNP REFERRING PROVIDER: Dr. Audrea Muscat Thenganatt  END OF SESSION:  OT End of Session - 09/06/22 1324     Visit Number 5    Number of Visits 17    Date for OT Re-Evaluation 09/24/22    Authorization Type BCBS [place] 2024,  No Deductible,  [place]; [place]  Auth Not reqd    Authorization - Number of Visits 10    Progress Note Due on Visit 10    OT Start Time 1320    OT Stop Time 1405    OT Time Calculation (min) 45 min    Activity Tolerance Patient tolerated treatment well    Behavior During Therapy WFL for tasks assessed/performed                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   Past Surgical History:  Procedure Laterality Date   TUBAL LIGATION     Patient Active Problem List   Diagnosis Date Noted   Bradycardia 01/17/2022   Parkinson's disease 02/17/2020   Gait abnormality 12/04/2019   Prediabetes 12/03/2018   Vitamin D deficiency 12/03/2018   Anemia due to vitamin B12 deficiency 12/03/2018   Fatigue 12/03/2018   Vitamin B12 deficiency 10/01/2018   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 No recent falls  Pt accompanied by:  dtr  PERTINENT HISTORY:  Parkinson's Disease, last seen by OT 12/2021 (recommended 6 month re-evaluation at OT d/c)  PMH:  Osteopenia, glaucoma, prediabetes, Vitamin D deficiency, anemia due to B12 deficiency, bradycardia    PRECAUTIONS: Fall  WEIGHT BEARING RESTRICTIONS: No  PAIN:  Are you having pain? No  FALLS: Has patient fallen in last 6 months? Yes. Number of falls 2  at granddaughter's dance practice  LIVING ENVIRONMENT: Lives with: lives with their family and lives with their daughter Lives in: House/apartment Stairs: No Has following equipment at home: Grab bars  PLOF: Independent, no longer driving  PATIENT GOALS:improve balance, coordination   OBJECTIVE:   HAND DOMINANCE: Right  ADLs: Overall ADLs: difficulty sequencing at times, uses checklist prior to leaving house Transfers/ambulation related to ADLs: Eating: difficulty cutting food with fork/knife, some difficulty opening bottles/packages Grooming: mod I UB Dressing: difficulty getting shirts over head, difficulty putting earrings in at times LB Dressing: slide in shoes most of the time (can tie) Toileting: some difficulty with sitting down on toilet, some urgency, forgetting to flush Bathing: Washes at the sink in standing   Tub Shower transfers: washes at the sink   IADLs: Light housekeeping: assists daughter  Meal Prep: assists daughter with tasks Community mobility: no longer drives Medication management: dtr supervises/assists Handwriting:  illegible at time per pt  MOBILITY STATUS: Independent and  Freezing  POSTURE COMMENTS:  rounded shoulders, forward head, and weight shift left  FUNCTIONAL OUTCOME MEASURES: Physical performance test: PPT#2 (simulated eating) Not tested & PPT#4 (donning/doffing jacket): 24sec  COORDINATION: 9 Hole Peg test: Right: 30.53 sec; Left: 47 sec Box and Blocks:  Right 43blocks, Left 42blocks bradykinesia noted LUE  UE ROM:  BUEs grossly  WFL (approx 140* shoulder flex with elbow ext  bilaterally)  UE MMT:   Not tested  SENSATION: Not tested  MUSCLE TONE: RUE: Within functional limits and LUE: Within functional limits  COGNITION: Overall cognitive status: Impaired  Pt with impaired memory, decr awareness of postural changes, bradyphrenia, and difficulty sequencing at times per dtr  OBSERVATIONS: Bradykinesia, flat affect, decr eye contact and head turns   TODAY'S TREATMENT:                                                                                                                               PWR! Basic 4 in sitting 10 reps each min v.c for amplitude and sequencing correctly for PWR! Step. Also as a warm up prior to practicing donning/doffing jacket  Practiced donning light jacket using large amplitude movement strategies and cues to have hands at sleeve openings before donning using cape method. Pt then doffing light sweater jacket, with cues to turn and look at jacket for doffing over shoulders, then grabbing hands in back before doffing off sleeves.  Min cues for large amplitude movements and grasping big.  Discussed ways to prevent future PD related complications and issued handout. Discussed PD medication management and avoiding with meal time  Practiced stepping up to table and bringing chair to her before sitting at table and then sliding chair away from table, scooting to front of chair, and then PWR! Up to stand.  Pt also issued handwriting strategies and verbally reviewed strategies w/ pt/daughter, however pt did not have time to practice d/t time constraints       PATIENT EDUCATION: Education details: strategies for donning/doffing jacket, preventing future PD related complications, handwriting strategies Person educated: Patient and Child(ren) Education method: Explanation, Demonstration, Tactile cues, and Verbal cues, handout Education comprehension: verbalized understanding, returned demonstration, verbal cues required  HOME EXERCISE  PROGRAM: 08/23/22- PWR1 basic 4 in seated 09/06/22: future PD related complications, handwriting strategies  GOALS: Potential Goals reviewed with patient? Yes  SHORT TERM GOALS: Target date: 08/25/22  Pt will be independent with updated HEP. Goal status: in progress  2.  Pt will report incr ease with donning pull-over shirt without assist consistently. Goal status: in progress  3.  Pt will verbalize understanding of ways to prevent future PD related complications and appropriate community resources. Goal status: in progress (issued first part 09/06/22)  4.  Pt will be able to write at least 2 sentences with good legibility and size. Goal status: MET 07/28/22   LONG TERM GOALS: Target date: 09/23/22  Pt/caregiver will verbalize understanding of adaptive strategies/AE to incr safety  and ease with ADLs/IADLs. Goal status: INITIAL  2.  Pt will don/doff jacket in less than 20sec for incr ease using strategies prn. Baseline:  24sec Goal status: INITIAL  3.  Pt will improve L hand coordination for ADLs/IADLs as shown by decr time on 9-hole peg test by at least 8sec.  Baseline:  47sec Goal status: in progress  4.  Pt will improve functional reaching/coordination as shown by improving score on box and blocks test by at least 4 bilaterally. Baseline: R-43, L-42 Goal status: INITIAL  5.  Pt/dtr will verbalize understanding of cognitive compensation strategies for ADLs/IADLs. Goal status: INITIAL   ASSESSMENT:  CLINICAL IMPRESSION: Pt progressing towards goals.  Pt demo improving  large amplitude movement strategies with ADLS. Pt/daughter with greater awareness into ADL strategies  PERFORMANCE DEFICITS: in functional skills including ADLs, IADLs, coordination, dexterity, Fine motor control, mobility, balance, decreased knowledge of use of DME, and UE functional use, cognitive skills including memory, safety awareness, and sequencing, and psychosocial skills including environmental  adaptation and routines and behaviors.   IMPAIRMENTS: are limiting patient from ADLs, IADLs, and leisure.   COMORBIDITIES:  may have co-morbidities  that affects occupational performance. Patient will benefit from skilled OT to address above impairments and improve overall function.  MODIFICATION OR ASSISTANCE TO COMPLETE EVALUATION: Min-Moderate modification of tasks or assist with assess necessary to complete an evaluation.  OT OCCUPATIONAL PROFILE AND HISTORY: Detailed assessment: Review of records and additional review of physical, cognitive, psychosocial history related to current functional performance.  CLINICAL DECISION MAKING: Moderate - several treatment options, min-mod task modification necessary  REHAB POTENTIAL: Good  EVALUATION COMPLEXITY: Moderate   PLAN:  OT FREQUENCY: 2x/week  OT DURATION: 8 weeks +eval  PLANNED INTERVENTIONS: self care/ADL training, therapeutic exercise, therapeutic activity, neuromuscular re-education, manual therapy, passive range of motion, balance training, functional mobility training, splinting, paraffin, fluidotherapy, moist heat, cryotherapy, patient/family education, cognitive remediation/compensation, and DME and/or AE instructions  RECOMMENDED OTHER SERVICES: current with PT, ST  CONSULTED AND AGREED WITH PLAN OF CARE: Patient and family Midwife (dtr)  PLAN FOR NEXT SESSION:   practice handwriting, issue handout on ADL strategies and include specifics about donning/doffing jacket, ? Bag HEP   Hans Eden, OTR/L 09/06/2022, 2:16 PM

## 2022-09-06 NOTE — Therapy (Signed)
OUTPATIENT SPEECH LANGUAGE PATHOLOGY PARKINSON'S TREATMENT   Patient Name: Denise [name] MRN: [id number] DOB:[birth year], 72 y.o., female Today's Date: 09/06/2022  PCP: Minette Brine FNP REFERRING PROVIDER: Thenganatt   END OF SESSION:  End of Session - 09/06/22 1445     Visit Number 9    Number of Visits 17    Date for SLP Re-Evaluation 09/07/22    Authorization Type BCBS    SLP Start Time 1445    SLP Stop Time  1530    SLP Time Calculation (min) 45 min    Activity Tolerance Patient tolerated treatment well              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   Past Surgical History:  Procedure Laterality Date   TUBAL LIGATION     Patient Active Problem List   Diagnosis Date Noted   Bradycardia 01/17/2022   Parkinson's disease 02/17/2020   Gait abnormality 12/04/2019   Prediabetes 12/03/2018   Vitamin D deficiency 12/03/2018   Anemia due to vitamin B12 deficiency 12/03/2018   Fatigue 12/03/2018   Vitamin B12 deficiency 10/01/2018   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 No  PATIENT GOALS: improve confidence while speaking  OBJECTIVE:   TODAY'S TREATMENT:                                                                                                                                         09-06-22: Pt enters  with sub-optimal volume, endorses reduced volume in response to SLP questioning. Given verbal prompts and modeling, pt with minimal improvement in volume. SLP leads pt through warm up to encourage increased effort and intentional speech. Following warm up, pt's volume noted to increased, averaging 72 dB in conversation across 10 minute sample. Pt endorses perception of improvement of vocal intensity. Tells SLP the warm up helps in optimizing intensity. Occasional volume decay noted, without negative impact on intelligibility.   Pt reports pt has joined a women's group at church, pt is participating in activity 1x/week. Pt reports speech therapy has been helpful in increasing confidence in engaging with others.   08-25-22: Pt walked in with reduced volume of 68 dB and under, reporting she was tired.  SLP reviewed the principles of Speak out! and the meaning of speaking with intent. Pt discussed difficulty word-finding and demonstrated knowledge of anomia strategies that she implements independently.  Pt completed Speak out lesson 21.    "Ah"- 78-80 dB  Counting- 72-74 dB   Reading- 70 dB  Clinician instructed pt in modified Semantic Feature Analysis (SFA). During structured SFA task, pt required rare min verbal cues to maintain adequate conversational volume. Pt successful in communicating target word through modified SFA in 6/6 opportunities with occasional verbal prompts to elaborate description.    PATIENT EDUCATION: Education details: see above Person educated: Patient and Child(ren) Education method: Customer service manager Education comprehension: verbalized understanding, returned demonstration, and needs further education  HOME EXERCISE PROGRAM: Speak Out! Program    GOALS: Goals reviewed with patient? Yes  SHORT TERM GOALS: Target date: 08/10/2022  Pt will achieve targeted dB levels in demonstration of Speak Out! Lessons with 90% accuracy given rare min A over 2 sessions   Baseline: 08/04/22 Goal status: MET  2.  Pt will utilize dysarthria compensations in 5-10 minute conversation and average WNL conversational volume (70-72 dB) given occasional min A over 2 sessions   Baseline: 08/04/22 Goal status: MET  3.  Pt and family will carryover memory compensations to aid daily functioning at home (recalling needed items/daily tasks) given occasional min A over 2 sessions  Baseline: 07-28-22, 08/04/22 Goal status: MET  4.  Pt will carryover dysarthria strategies in public speaking role play to be 100% intelligible given occasional min A Baseline:  Goal status: MET  LONG TERM GOALS: Target date: 09/07/2022  Pt will utilize dysarthria compensations in 15+ minute conversation to optimize vocal intensity and clarity given rare min A over 2 sessions Baseline: 09/06/2022 Goal status: MET  2.  Pt and family will carryover memory compensations to aid daily functioning at home (recalling needed items/daily tasks) with mod I  Baseline: 09/06/2022 Goal status: MET  3.  Pt will subjectively report increased confidence and attend increased number of events per family baseline  Baseline: 09/06/2022 Goal status: MET   ASSESSMENT:  CLINICAL IMPRESSION: Pt with good carryover of dysarthria strategies and compensations, although continues to experience reduced volume during moments of fatigue. SLP reviewed principles of Speak Out program this date. Pt expressed that she occasionally experiences word-finding difficulties.Provided education on anomia compensations and strategies. Pt verbalized understanding. 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    OBJECTIVE IMPAIRMENTS: Objective impairments include memory and dysarthria. These impairments are limiting patient from household responsibilities and effectively communicating at home and in community.Factors affecting potential to achieve goals and functional outcome are medical prognosis.. Patient will  benefit from skilled SLP services to address above impairments and improve overall function.  REHAB POTENTIAL: Good  PLAN:  SLP FREQUENCY: 2x/week  SLP DURATION: 8 weeks  PLANNED INTERVENTIONS: Language facilitation, Environmental controls, Cueing hierachy, Cognitive reorganization, Internal/external aids, Functional tasks, Multimodal communication approach, SLP instruction and feedback, Compensatory strategies, and Patient/family education  SPEECH THERAPY DISCHARGE SUMMARY  Visits from Start of Care: 9  Current functional level related to goals / functional outcomes: Improved intelligibility and vocal intensity with rare min-A from SLP during structured tasks and conversational speech. Pt reports compliance with HEP, finds Speak Out! resources to be helpful. Has implemented external aids and routines to support decline in memory.    Remaining deficits: Dysarthria, cognitive-communication impairment   Education / Equipment: Dysarthria strategies and compensations, Speak Out!, external memory aids, caregiver cueing,  HEP   Patient agrees to discharge. Patient goals were met. Patient is being discharged due to meeting the stated rehab goals.Su Monks, [place] 09/06/2022, 2:45 PM

## 2022-09-12 ENCOUNTER — Ambulatory Visit: Payer: Medicare Other | Admitting: Occupational Therapy

## 2022-09-12 ENCOUNTER — Ambulatory Visit: Payer: Medicare Other | Admitting: Physical Therapy

## 2022-09-12 ENCOUNTER — Encounter: Payer: Self-pay | Admitting: Occupational Therapy

## 2022-09-12 DIAGNOSIS — R2681 Unsteadiness on feet: Secondary | ICD-10-CM | POA: Diagnosis not present

## 2022-09-12 DIAGNOSIS — M6281 Muscle weakness (generalized): Secondary | ICD-10-CM | POA: Diagnosis not present

## 2022-09-12 DIAGNOSIS — R29818 Other symptoms and signs involving the nervous system: Secondary | ICD-10-CM | POA: Diagnosis not present

## 2022-09-12 DIAGNOSIS — R278 Other lack of coordination: Secondary | ICD-10-CM

## 2022-09-12 DIAGNOSIS — R2689 Other abnormalities of gait and mobility: Secondary | ICD-10-CM | POA: Diagnosis not present

## 2022-09-12 DIAGNOSIS — M25612 Stiffness of left shoulder, not elsewhere classified: Secondary | ICD-10-CM | POA: Diagnosis not present

## 2022-09-12 DIAGNOSIS — R29898 Other symptoms and signs involving the musculoskeletal system: Secondary | ICD-10-CM | POA: Diagnosis not present

## 2022-09-12 DIAGNOSIS — R41841 Cognitive communication deficit: Secondary | ICD-10-CM | POA: Diagnosis not present

## 2022-09-12 DIAGNOSIS — R471 Dysarthria and anarthria: Secondary | ICD-10-CM | POA: Diagnosis not present

## 2022-09-12 DIAGNOSIS — R293 Abnormal posture: Secondary | ICD-10-CM | POA: Diagnosis not present

## 2022-09-12 NOTE — Patient Instructions (Signed)
Bag Exercises:  Small trash bag or produce bag works best.  For all exercises, sit with big posture (sit up tall with head up) and use big movements. Perform the following exercises 1 times per day, preferably before dressing.   Place bag on table (vertical) Hold end of bag in both hands. Stretch fingers out big to draw the entire bag into your palm. Repeat 10 times. Hold bag in both hands in front of you with hands/arms shoulder length apart. Move bag behind your head. Repeat 10 times. Hold bag in both hands in front of you with hands/arms shoulder length apart. Lift leg and move bag completely under each foot and back. Repeat 10 times on each side. Hold bag in right hand. Move right hand to reach behind shoulder. Then, reach behind back with left hand to pass bag from right hand to left hand. Switch sides. Repeat 10 times on each side.  To put on jacket:  1) Turn jacket to face out (tag facing out) 2) line hands up to sleeves 3) fling around like swinging a cape around your shoulders 4) Punch in one arm, then the other arm  To take jacket off:  1) Open hands big and grab on each side (belly button level) 2) Twist to take off one shoulder, then other shoulder 3) let go of jacket and reach big behind back to touch hands 4) grab ends of sleeves big and yank off    Performing Daily Activities with Big Movements  Pick at least one activity a day and perform with BIG, DELIBERATE movements/effort. This can make the activity easier and turn daily activities into exercise!  If you are standing during the activity, make sure to keep feet apart and stand with good/big/PWR! UP posture.  Examples: Dressing - Push arms in sleeves, twist when putting on jacket, push foot into pants, open hands to pull down shirt/put on socks/pull up pants Bathing - Wash/dry with long strokes Brushing your teeth - Big, slow movements Cutting food - Long deliberate cuts, hold utensil at middle and not end Opening  jar/bottle - Move as much as you can with each turn Picking up a cup/bottle - Open hand up big and get object all the way in palm Hanging up clothes/getting clothes down from closet - Reach with big effort Putting away groceries/dishes - Reach with big effort Wiping counter/table - Move in big, long strokes Stirring while cooking - Exaggerate movement Cleaning windows - Move in big, long strokes Sweeping - Move arms in big, long strokes Folding clothes - Exaggerate arm movements Changing light bulb - Move as much as you can with each turn Using a screwdriver - Move as much as you can with each turn Walking into a store/restaurant - Walk with big steps, swing arms if able Standing up from a chair/recliner/sofa - Scoot forward, lean forward, and stand with big effort

## 2022-09-12 NOTE — Therapy (Signed)
OUTPATIENT PHYSICAL THERAPY NEURO TREATMENT   Patient Name: Denise [name] MRN: [id number] DOB:Aug 05, 1950, 72 y.o., female Today's Date: 09/12/2022   PCP: Minette Brine, FNP  REFERRING PROVIDER: Buck Mam Audrea Muscat, MD     END OF SESSION:  PT End of Session - 09/12/22 1447     Visit Number 14    Number of Visits 17    Date for PT Re-Evaluation 10/05/22    Authorization Type BCBS    PT Start Time 1447   Handoff w/OT   PT Stop Time 1530    PT Time Calculation (min) 43 min    Equipment Utilized During Treatment Gait belt    Activity Tolerance Patient tolerated treatment well    Behavior During Therapy WFL for tasks assessed/performed                     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   Past Surgical History:  Procedure Laterality Date   TUBAL LIGATION     Patient Active Problem List   Diagnosis Date Noted   Bradycardia 01/17/2022   Parkinson's disease 02/17/2020   Gait abnormality 12/04/2019   Prediabetes 12/03/2018   Vitamin D deficiency 12/03/2018   Anemia due to vitamin B12 deficiency 12/03/2018   Fatigue 12/03/2018   Vitamin B12 deficiency 10/01/2018   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 Has been  working on [employer] regularly.   Pt accompanied by: family member daughter   PERTINENT HISTORY: PD, glaucoma, osteoporosis (newly diagnosed), TB (as a child)    PAIN:  Are you having pain? No  PRECAUTIONS: Fall and Other: Osteoporosis  WEIGHT BEARING RESTRICTIONS: No  FALLS: Has patient fallen in last 6 months? Yes. Number of falls 2  LIVING ENVIRONMENT: Lives with: lives with their family - daughter and granddaughter  Lives in: House/apartment Stairs: Yes: External: 2 steps; none Has following equipment at home: Single point cane and Grab bars  PLOF: Independent, pt's daughter reports sometimes pt needs help with dressing   PATIENT GOALS: See how she was since the last time she was here. Work on the balance   OBJECTIVE:  TODAY'S TREATMENT:     Ther Ex  SciFit multi-peaks level 6 for 8 minutes using BUE/BLEs for neural priming for reciprocal movement, dynamic cardiovascular warmup and increased amplitude of stepping. RPE of 2/10 following activity.   NMR  In // bars for improved lateral weight shifting and postural control: Resisted alt fwd step ups to 4" box, x8 per side, w/blue resistance band around hips pulling pt posteriorly to promote anterior shift. Pt very challenged by this and initially required min A to stabilize due to posterior lean, but with practice was able to perform w/CGA. Pt w/increased difficulty stepping up w/LLE.  With same resistance band, resisted side stepping, x6 each direction, for continued weight shift practice. Pt w/significant difficulty when stepping w/resistance pulling her to the L, requiring min A to facilitate weight shift at pelvis. CGA when resistance pulling to the R  On rockerboard in A/P direction, standing unsupported and using mirror for visual biofeedback on body position x4 minutes. Pt demonstrating increased posterior lean today, requiring mod cues for anterior weight shift. With cues, pt able to stabilize w/CGA-min A at times due to  posterior LOB. Progressed to performing single step down per side, but pt required min-mod A for lateral weight shift, especially when shifting to L side    PATIENT EDUCATION: Education details: Continue HEP and working on weight shifting, progression seen in PD Person educated: Patient and daughter  Education method: Consulting civil engineer, Demonstration, and Verbal cues Education comprehension: verbalized understanding, returned demonstration, and needs further education  HOME EXERCISE PROGRAM: From previous bout of therapy: Will review/updated as needed.   Standing and seated PWR, Pt has in her exercise folder.     Access Code: [email or web address] URL: [email or web address] Date: 10/19/2021 Prepared by: [name]   Reviewed/condensed exercises for balance:    Exercises - Alternating Step Backward with Support  - 1 x daily - 5 x weekly - 1-2 sets - 10 reps - Romberg Stance Eyes Closed on Foam Pad  - 1 x daily - 5 x weekly - 3 sets - 30 hold   Plus standing tall against the wall for posture - 2 x 30 second holds    GOALS: Goals reviewed with patient? Yes  SHORT TERM GOALS: ALL STGS = LTGS    LONG TERM GOALS: Target date: 10/05/22  UPDATED/ON-GOING LTGS FOR RE-CERT:    Pt will be independent with final PD and walking program HEP for improved strength, balance, transfers, and gait  Baseline:  Goal status: IN PROGRESS  2.  Pt will verbalize and demonstrate understanding of fall prevention in the home/community (use of appropriate AD, backing up into chairs, freezing strategies) Baseline:  Goal status: IN PROGRESS  3.  Pt will improve TUG time with no AD to 13.5 seconds or less in order to demo decrease fall risk Baseline: 15.25 seconds Goal status: NEW   ASSESSMENT:  CLINICAL IMPRESSION: Emphasis of skilled PT session on reciprocal movement patterns, anterior and lateral weight shifting techniques. Pt demonstrated significant difficulty performing lateral weight  shifting today, especially to L side, requiring mod tactile cues to perform. Pt continues to have posterior lean preference but can improve w/use of tactile or verbal cues.  Pt will continue to benefit from skilled PT to work on weight shifting, postural deficits and gait kinematics in relation to PD. Continue POC.    OBJECTIVE IMPAIRMENTS: Abnormal gait, decreased activity tolerance, decreased balance, decreased cognition, decreased coordination, decreased knowledge of use of DME, decreased mobility, difficulty walking, decreased strength, decreased safety awareness, impaired flexibility, impaired tone, and postural dysfunction.   ACTIVITY LIMITATIONS: carrying, lifting, bending, stairs, transfers, dressing, and locomotion level  PARTICIPATION LIMITATIONS: meal prep, cleaning, driving, shopping, and community activity  PERSONAL FACTORS: Age, Behavior pattern, Past/current experiences, Time since onset of injury/illness/exacerbation, and 3+ comorbidities: PD, glaucoma, osteoporosis (newly diagnosed), TB (as a child)    are also affecting patient's functional outcome.   REHAB POTENTIAL: Good  CLINICAL DECISION MAKING: Evolving/moderate complexity  EVALUATION COMPLEXITY: Moderate  PLAN:  PT FREQUENCY: 1-2x/week  PT DURATION: 4 weeks  PLANNED INTERVENTIONS: Therapeutic exercises, Therapeutic activity, Neuromuscular re-education, Balance training, Gait training, Patient/Family education, Self Care, Stair training, Vestibular training, Visual/preceptual remediation/compensation, DME instructions, Manual therapy, and Re-evaluation  PLAN FOR NEXT SESSION:  Add walking program. Continue to work on gait with cane and rollator - trying obstacles. Review HEP and begin to finalize. Posture awareness as pt with heavy lean to L. Lateral stepping strategies/weight shifting.Continue ball kicks on TM. SLS tasks. Resisted gait.    Cruzita Lederer Maleeyah Mccaughey, PT, DPT  09/12/2022, 3:36 PM

## 2022-09-12 NOTE — Therapy (Signed)
, OUTPATIENT OCCUPATIONAL THERAPY [facility]  Patient Name: Denise [name] MRN: [id number] DOB:08-04-1950, 72 y.o., female Today's Date: 09/12/2022  PCP: Minette Brine, FNP REFERRING PROVIDER: Dr. Audrea Muscat Thenganatt  END OF SESSION:  OT End of Session - 09/12/22 1404     Visit Number 6    Number of Visits 17    Date for OT Re-Evaluation 09/24/22    Authorization Type [employer] [place] 2024,  No Deductible,  [place]; [place]  Auth Not reqd    Authorization - Number of Visits 10    Progress Note Due on Visit 10    OT Start Time 1402    OT Stop Time 1445    OT Time Calculation (min) 43 min    Activity Tolerance Patient tolerated treatment well    Behavior During Therapy WFL for tasks assessed/performed                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   Past Surgical History:  Procedure Laterality Date   TUBAL LIGATION     Patient Active Problem List   Diagnosis Date Noted   Bradycardia 01/17/2022   Parkinson's disease 02/17/2020   Gait abnormality 12/04/2019   Prediabetes 12/03/2018   Vitamin D deficiency 12/03/2018   Anemia due to vitamin B12 deficiency 12/03/2018   Fatigue 12/03/2018   Vitamin B12 deficiency 10/01/2018   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 No recent falls  Pt accompanied by:  dtr  PERTINENT HISTORY: Parkinson's Disease,  last seen by OT 12/2021 (recommended 6 month re-evaluation at OT d/c)  PMH:  Osteopenia, glaucoma, prediabetes, Vitamin D deficiency, anemia due to B12 deficiency, bradycardia    PRECAUTIONS: Fall  WEIGHT BEARING RESTRICTIONS: No  PAIN:  Are you having pain? No  FALLS: Has patient fallen in last 6 months? Yes. Number of falls 2  at granddaughter's dance practice  LIVING ENVIRONMENT: Lives with: lives with their family and lives with their daughter Lives in: House/apartment Stairs: No Has following equipment at home: Grab bars  PLOF: Independent, no longer driving  PATIENT GOALS:improve balance, coordination   OBJECTIVE:   HAND DOMINANCE: Right  ADLs: Overall ADLs: difficulty sequencing at times, uses checklist prior to leaving house Transfers/ambulation related to ADLs: Eating: difficulty cutting food with fork/knife, some difficulty opening bottles/packages Grooming: mod I UB Dressing: difficulty getting shirts over head, difficulty putting earrings in at times LB Dressing: slide in shoes most of the time (can tie) Toileting: some difficulty with sitting down on toilet, some urgency, forgetting to flush Bathing: Washes at the sink in standing   Tub Shower transfers: washes at the sink   IADLs: Light housekeeping: assists daughter  Meal Prep: assists daughter with tasks Community mobility: no longer drives Medication management: dtr supervises/assists Handwriting:  illegible at time per pt  MOBILITY STATUS: Independent and Freezing  POSTURE COMMENTS:  rounded shoulders, forward head, and weight shift left  FUNCTIONAL OUTCOME MEASURES: Physical performance test: PPT#2 (simulated eating) Not tested & PPT#4 (donning/doffing jacket): 24sec  COORDINATION: 9 Hole Peg test: Right: 30.53 sec; Left: 47 sec Box and Blocks:  Right 43blocks, Left 42blocks bradykinesia noted LUE  UE ROM:  BUEs grossly  WFL (approx 140* shoulder flex with elbow ext bilaterally)  UE MMT:    Not tested  SENSATION: Not tested  MUSCLE TONE: RUE: Within functional limits and LUE: Within functional limits  COGNITION: Overall cognitive status: Impaired  Pt with impaired memory, decr awareness of postural changes, bradyphrenia, and difficulty sequencing at times per dtr  OBSERVATIONS: Bradykinesia, flat affect, decr eye contact and head turns   TODAY'S TREATMENT:                                                                                                                               Practiced and reviewed strategies for donning/doffing jacket - pt issued handout on specific strategies for jacket as well as ADL strategies in general. Also reviewed strategies for cutting food and opening twist lid/jar - pt return demo.   Pt issued bag ex's to improve dressing including: getting shirt over head, reaching behind back to doff jacket, getting legs into pants, and pulling up pants/socks - see pt instructions for details. Pt demo each x 10 reps bilaterally      PATIENT EDUCATION: Education details: strategies for donning/doffing jacket, ADL strategies, bag HEP Person educated: Patient and Child(ren) Education method: Explanation, Demonstration, Tactile cues, and Verbal cues, handout Education comprehension: verbalized understanding, returned demonstration, verbal cues required  HOME EXERCISE PROGRAM:  1/245/24: Shoulder flex, PWR! Hands, coordination HEP  08/23/22- PWR1 basic 4 in seated 09/06/22: future PD related complications, handwriting strategies 09/12/22: ADL strategies, strategies for donning/doffing jacket, bag HEP  GOALS: Potential Goals reviewed with patient? Yes  SHORT TERM GOALS: Target date: 08/25/22  Pt will be independent with updated HEP. Goal status: in progress  2.  Pt will report incr ease with donning pull-over shirt without assist consistently. Goal status: in progress  3.  Pt will verbalize understanding of ways to prevent future PD related  complications and appropriate community resources. Goal status: in progress (issued first part 09/06/22)  4.  Pt will be able to write at least 2 sentences with good legibility and size. Goal status: MET 07/28/22   LONG TERM GOALS: Target date: 09/23/22  Pt/caregiver will verbalize understanding of adaptive strategies/AE to incr safety and ease with ADLs/IADLs. Goal status: INITIAL  2.  Pt will don/doff jacket in less than 20sec for incr ease using strategies prn. Baseline:  24sec Goal status: INITIAL  3.  Pt will improve L hand coordination for ADLs/IADLs as shown by decr time on 9-hole peg test by at least 8sec.  Baseline:  47sec Goal status: in progress  4.  Pt will improve functional reaching/coordination as shown by improving score on box and  blocks test by at least 4 bilaterally. Baseline: R-43, L-42 Goal status: INITIAL  5.  Pt/dtr will verbalize understanding of cognitive compensation strategies for ADLs/IADLs. Goal status: INITIAL   ASSESSMENT:  CLINICAL IMPRESSION: Pt progressing towards goals.  Pt demo improving large amplitude movement strategies with ADLS. Pt/daughter with greater awareness into ADL strategies  PERFORMANCE DEFICITS: in functional skills including ADLs, IADLs, coordination, dexterity, Fine motor control, mobility, balance, decreased knowledge of use of DME, and UE functional use, cognitive skills including memory, safety awareness, and sequencing, and psychosocial skills including environmental adaptation and routines and behaviors.   IMPAIRMENTS: are limiting patient from ADLs, IADLs, and leisure.   COMORBIDITIES:  may have co-morbidities  that affects occupational performance. Patient will benefit from skilled OT to address above impairments and improve overall function.  MODIFICATION OR ASSISTANCE TO COMPLETE EVALUATION: Min-Moderate modification of tasks or assist with assess necessary to complete an evaluation.  OT OCCUPATIONAL PROFILE AND  HISTORY: Detailed assessment: Review of records and additional review of physical, cognitive, psychosocial history related to current functional performance.  CLINICAL DECISION MAKING: Moderate - several treatment options, min-mod task modification necessary  REHAB POTENTIAL: Good  EVALUATION COMPLEXITY: Moderate   PLAN:  OT FREQUENCY: 2x/week  OT DURATION: 8 weeks +eval  PLANNED INTERVENTIONS: self care/ADL training, therapeutic exercise, therapeutic activity, neuromuscular re-education, manual therapy, passive range of motion, balance training, functional mobility training, splinting, paraffin, fluidotherapy, moist heat, cryotherapy, patient/family education, cognitive remediation/compensation, and DME and/or AE instructions  RECOMMENDED OTHER SERVICES: current with PT, ST  CONSULTED AND AGREED WITH PLAN OF CARE: Patient and family Midwife (dtr)  PLAN FOR NEXT SESSION:   practice handwriting, review bag HEP and coordination HEP prn, progress towards remaining goals   Hans Eden, OTR/L 09/12/2022, 2:05 PM

## 2022-09-13 DIAGNOSIS — I70203 Unspecified atherosclerosis of native arteries of extremities, bilateral legs: Secondary | ICD-10-CM | POA: Diagnosis not present

## 2022-09-13 DIAGNOSIS — L84 Corns and callosities: Secondary | ICD-10-CM | POA: Diagnosis not present

## 2022-09-13 DIAGNOSIS — B351 Tinea unguium: Secondary | ICD-10-CM | POA: Diagnosis not present

## 2022-09-13 DIAGNOSIS — M79676 Pain in unspecified toe(s): Secondary | ICD-10-CM | POA: Diagnosis not present

## 2022-09-14 ENCOUNTER — Encounter: Payer: Self-pay | Admitting: Occupational Therapy

## 2022-09-14 ENCOUNTER — Ambulatory Visit: Payer: Medicare Other | Admitting: Occupational Therapy

## 2022-09-14 DIAGNOSIS — R29818 Other symptoms and signs involving the nervous system: Secondary | ICD-10-CM | POA: Diagnosis not present

## 2022-09-14 DIAGNOSIS — R2689 Other abnormalities of gait and mobility: Secondary | ICD-10-CM | POA: Diagnosis not present

## 2022-09-14 DIAGNOSIS — R471 Dysarthria and anarthria: Secondary | ICD-10-CM | POA: Diagnosis not present

## 2022-09-14 DIAGNOSIS — R293 Abnormal posture: Secondary | ICD-10-CM | POA: Diagnosis not present

## 2022-09-14 DIAGNOSIS — M25612 Stiffness of left shoulder, not elsewhere classified: Secondary | ICD-10-CM

## 2022-09-14 DIAGNOSIS — M6281 Muscle weakness (generalized): Secondary | ICD-10-CM | POA: Diagnosis not present

## 2022-09-14 DIAGNOSIS — R29898 Other symptoms and signs involving the musculoskeletal system: Secondary | ICD-10-CM | POA: Diagnosis not present

## 2022-09-14 DIAGNOSIS — R2681 Unsteadiness on feet: Secondary | ICD-10-CM | POA: Diagnosis not present

## 2022-09-14 DIAGNOSIS — R278 Other lack of coordination: Secondary | ICD-10-CM | POA: Diagnosis not present

## 2022-09-14 DIAGNOSIS — R41841 Cognitive communication deficit: Secondary | ICD-10-CM | POA: Diagnosis not present

## 2022-09-14 NOTE — Therapy (Signed)
, OUTPATIENT OCCUPATIONAL THERAPY [facility]  Patient Name: Denise [name] MRN: [id number] DOB:[birth year], 72 y.o., female Today's Date: 09/14/2022  PCP: Minette Brine, FNP REFERRING PROVIDER: Dr. Audrea Muscat Thenganatt  END OF SESSION:  OT End of Session - 09/14/22 1404     Visit Number 7    Number of Visits 17    Date for OT Re-Evaluation 09/24/22    Authorization Type [employer] [place] 2024,  No Deductible,  [place]; [place]  Auth Not reqd    Authorization - Number of Visits --    Progress Note Due on Visit 10    OT Start Time 1405    OT Stop Time 1445    OT Time Calculation (min) 40 min    Activity Tolerance Patient tolerated treatment well    Behavior During Therapy [facility] for tasks assessed/performed                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   Past Surgical History:  Procedure Laterality Date   TUBAL LIGATION     Patient Active Problem List   Diagnosis Date Noted   Bradycardia 01/17/2022   Parkinson's disease 02/17/2020   Gait abnormality 12/04/2019   Prediabetes 12/03/2018   Vitamin D deficiency 12/03/2018   Anemia due to vitamin B12 deficiency 12/03/2018   Fatigue 12/03/2018   Vitamin B12 deficiency 10/01/2018   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 No recent falls  Pt accompanied by:  daughter  PERTINENT HISTORY: Parkinson's Disease, last seen by OT 12/2021 (recommended 6  month re-evaluation at OT d/c)  PMH:  Osteopenia, glaucoma, prediabetes, Vitamin D deficiency, anemia due to B12 deficiency, bradycardia    PRECAUTIONS: Fall  WEIGHT BEARING RESTRICTIONS: No  PAIN:  Are you having pain? No  FALLS: Has patient fallen in last 6 months? Yes. Number of falls 2  at granddaughter's dance practice  LIVING ENVIRONMENT: Lives with: lives with their family and lives with their daughter Lives in: House/apartment Stairs: No Has following equipment at home: Grab bars  PLOF: Independent, no longer driving  PATIENT GOALS:improve balance, coordination   OBJECTIVE:   HAND DOMINANCE: Right  ADLs: Overall ADLs: difficulty sequencing at times, uses checklist prior to leaving house Transfers/ambulation related to ADLs: Eating: difficulty cutting food with fork/knife, some difficulty opening bottles/packages Grooming: mod I UB Dressing: difficulty getting shirts over head, difficulty putting earrings in at times LB Dressing: slide in shoes most of the time (can tie) Toileting: some difficulty with sitting down on toilet, some urgency, forgetting to flush Bathing: Washes at the sink in standing   Tub Shower transfers: washes at the sink   IADLs: Light housekeeping: assists daughter  Meal Prep: assists daughter with tasks Community mobility: no longer drives Medication management: dtr supervises/assists Handwriting:  illegible at time per pt  MOBILITY STATUS: Independent and Freezing  POSTURE COMMENTS:  rounded shoulders, forward head, and weight shift  left  FUNCTIONAL OUTCOME MEASURES: Physical performance test: PPT#2 (simulated eating) Not tested & PPT#4 (donning/doffing jacket): 24sec  COORDINATION: 9 Hole Peg test: Right: 30.53 sec; Left: 47 sec Box and Blocks:  Right 43blocks, Left 42blocks bradykinesia noted LUE  UE ROM:  BUEs grossly  WFL (approx 140* shoulder flex with elbow ext bilaterally)  UE MMT:   Not tested  SENSATION: Not  tested  MUSCLE TONE: RUE: Within functional limits and LUE: Within functional limits  COGNITION: Overall cognitive status: Impaired  Pt with impaired memory, decr awareness of postural changes, bradyphrenia, and difficulty sequencing at times per dtr  OBSERVATIONS: Bradykinesia, flat affect, decr eye contact and head turns   TODAY'S TREATMENT:                                                                                                                               Practiced writing 3 sentences w/ review of strategies - Pt able to maintain legibility and only mild decrease in size on 3rd sentence  Reviewed coordination HEP and bag HEP - pt demo each w/ less cues and better movement with bag HEP, however pt had some difficulty with coordination HEP especially in Rt hand - pt improved w/ repetition and mod cueing      PATIENT EDUCATION: See above  HOME EXERCISE PROGRAM:  1/245/24: Shoulder flex, PWR! Hands, coordination HEP  08/23/22- PWR1 basic 4 in seated 09/06/22: future PD related complications, handwriting strategies 09/12/22: ADL strategies, strategies for donning/doffing jacket, bag HEP   GOALS: Potential Goals reviewed with patient? Yes  SHORT TERM GOALS: Target date: 08/25/22  Pt will be independent with updated HEP. Goal status: MET  2.  Pt will report incr ease with donning pull-over shirt without assist consistently. Goal status: in progress  3.  Pt will verbalize understanding of ways to prevent future PD related complications and appropriate community resources. Goal status: in progress (issued first part 09/06/22)  4.  Pt will be able to write at least 2 sentences with good legibility and size. Goal status: MET 07/28/22   LONG TERM GOALS: Target date: 09/23/22  Pt/caregiver will verbalize understanding of adaptive strategies/AE to incr safety and ease with ADLs/IADLs. Goal status: INITIAL  2.  Pt will don/doff jacket in less than 20sec for incr ease using  strategies prn. Baseline:  24sec Goal status: INITIAL  3.  Pt will improve L hand coordination for ADLs/IADLs as shown by decr time on 9-hole peg test by at least 8sec.  Baseline:  47sec Goal status: in progress  4.  Pt will improve functional reaching/coordination as shown by improving score on box and blocks test by at least 4 bilaterally. Baseline: R-43, L-42 Goal status: INITIAL  5.  Pt/dtr will verbalize understanding of cognitive compensation strategies for ADLs/IADLs. Goal status: INITIAL   ASSESSMENT:  CLINICAL IMPRESSION: Pt progressing towards goals.  Pt demo improving large amplitude movement strategies with ADLS. Pt/daughter with greater awareness into  ADL strategies  PERFORMANCE DEFICITS: in functional skills including ADLs, IADLs, coordination, dexterity, Fine motor control, mobility, balance, decreased knowledge of use of DME, and UE functional use, cognitive skills including memory, safety awareness, and sequencing, and psychosocial skills including environmental adaptation and routines and behaviors.   IMPAIRMENTS: are limiting patient from ADLs, IADLs, and leisure.   COMORBIDITIES:  may have co-morbidities  that affects occupational performance. Patient will benefit from skilled OT to address above impairments and improve overall function.  MODIFICATION OR ASSISTANCE TO COMPLETE EVALUATION: Min-Moderate modification of tasks or assist with assess necessary to complete an evaluation.  OT OCCUPATIONAL PROFILE AND HISTORY: Detailed assessment: Review of records and additional review of physical, cognitive, psychosocial history related to current functional performance.  CLINICAL DECISION MAKING: Moderate - several treatment options, min-mod task modification necessary  REHAB POTENTIAL: Good  EVALUATION COMPLEXITY: Moderate   PLAN:  OT FREQUENCY: 2x/week  OT DURATION: 8 weeks +eval  PLANNED INTERVENTIONS: self care/ADL training, therapeutic exercise,  therapeutic activity, neuromuscular re-education, manual therapy, passive range of motion, balance training, functional mobility training, splinting, paraffin, fluidotherapy, moist heat, cryotherapy, patient/family education, cognitive remediation/compensation, and DME and/or AE instructions  RECOMMENDED OTHER SERVICES: current with PT, ST  CONSULTED AND AGREED WITH PLAN OF CARE: Patient and family Midwife (dtr)  PLAN FOR NEXT SESSION:  organize all HEP's and education and develop PD ex chart prn, issue community resources and assess remaining STG's.    Hans Eden, OTR/L 09/14/2022, 2:06 PM

## 2022-09-19 ENCOUNTER — Encounter: Payer: Self-pay | Admitting: Physical Therapy

## 2022-09-19 ENCOUNTER — Other Ambulatory Visit: Payer: Self-pay | Admitting: Nurse Practitioner

## 2022-09-19 ENCOUNTER — Ambulatory Visit: Payer: Medicare Other | Admitting: Physical Therapy

## 2022-09-19 ENCOUNTER — Ambulatory Visit: Payer: Medicare Other | Admitting: Occupational Therapy

## 2022-09-19 DIAGNOSIS — R41841 Cognitive communication deficit: Secondary | ICD-10-CM | POA: Diagnosis not present

## 2022-09-19 DIAGNOSIS — R29898 Other symptoms and signs involving the musculoskeletal system: Secondary | ICD-10-CM | POA: Diagnosis not present

## 2022-09-19 DIAGNOSIS — M25612 Stiffness of left shoulder, not elsewhere classified: Secondary | ICD-10-CM | POA: Diagnosis not present

## 2022-09-19 DIAGNOSIS — R29818 Other symptoms and signs involving the nervous system: Secondary | ICD-10-CM | POA: Diagnosis not present

## 2022-09-19 DIAGNOSIS — R2681 Unsteadiness on feet: Secondary | ICD-10-CM | POA: Diagnosis not present

## 2022-09-19 DIAGNOSIS — R278 Other lack of coordination: Secondary | ICD-10-CM

## 2022-09-19 DIAGNOSIS — R600 Localized edema: Secondary | ICD-10-CM

## 2022-09-19 DIAGNOSIS — R293 Abnormal posture: Secondary | ICD-10-CM

## 2022-09-19 DIAGNOSIS — M6281 Muscle weakness (generalized): Secondary | ICD-10-CM | POA: Diagnosis not present

## 2022-09-19 DIAGNOSIS — R471 Dysarthria and anarthria: Secondary | ICD-10-CM | POA: Diagnosis not present

## 2022-09-19 DIAGNOSIS — R2689 Other abnormalities of gait and mobility: Secondary | ICD-10-CM | POA: Diagnosis not present

## 2022-09-19 NOTE — Therapy (Signed)
OUTPATIENT PHYSICAL THERAPY NEURO TREATMENT   Patient Name: Denise [name] MRN: [id number] DOB:01/19/1951, 72 y.o., female Today's Date: 09/19/2022   PCP: Minette Brine, FNP  REFERRING PROVIDER: Buck Mam Audrea Muscat, MD     END OF SESSION:  PT End of Session - 09/19/22 1326     Visit Number 15    Number of Visits 17    Date for PT Re-Evaluation 10/05/22    Authorization Type BCBS    PT Start Time 1324   pt late to session   PT Stop Time 1400    PT Time Calculation (min) 36 min    Equipment Utilized During Treatment Gait belt    Activity Tolerance Patient tolerated treatment well    Behavior During Therapy WFL for tasks assessed/performed                     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   Past Surgical History:  Procedure Laterality Date   TUBAL LIGATION     Patient Active Problem List   Diagnosis Date Noted   Bradycardia 01/17/2022   Parkinson's disease 02/17/2020   Gait abnormality 12/04/2019   Prediabetes 12/03/2018   Vitamin D deficiency 12/03/2018   Anemia due to vitamin B12 deficiency 12/03/2018   Fatigue 12/03/2018   Vitamin B12 deficiency 10/01/2018   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   Abnormal auditory perception of both ears 05/10/2016   Osteopenia 08/23/2012    ONSET DATE:  06/30/2022  REFERRING DIAG: G20.A1 (ICD-10-CM) - Parkinsons   THERAPY DIAG:  Other lack of coordination  Muscle weakness (generalized)  Abnormal posture  Unsteadiness on feet  Other symptoms and signs involving the nervous system  Rationale for Evaluation and Treatment: Rehabilitation  SUBJECTIVE:                                                                                                                                                                                              SUBJECTIVE STATEMENT: Has been working on her exercises daily. No falls. Got her toe nails clipped and reports that her feet feel a lot better.    Pt accompanied by: family member daughter   PERTINENT HISTORY: PD, glaucoma, osteoporosis (newly diagnosed), TB (as a child)    PAIN:  Are you having pain? No  PRECAUTIONS: Fall and Other: Osteoporosis  WEIGHT BEARING RESTRICTIONS:  No  FALLS: Has patient fallen in last 6 months? Yes. Number of falls 2  LIVING ENVIRONMENT: Lives with: lives with their family - daughter and granddaughter  Lives in: House/apartment Stairs: Yes: External: 2 steps; none Has following equipment at home: Single point cane and Grab bars  PLOF: Independent, pt's daughter reports sometimes pt needs help with dressing   PATIENT GOALS: See how she was since the last time she was here. Work on the balance   OBJECTIVE:  TODAY'S TREATMENT:      NMR  Standing PWR Flow Up > Rock > Twist > Step x5 reps with PT demo, performed an additional 2 reps with pt doing it without PT demo cues and pt able to perform with min verbal cues Following multi-direction stepping sheet on level ground: Following sheet and stating direction out loud, initially needing mod cues on how to perform correctly and for incr step length, performed x3 reps. Progressed to pt stepping over 2" obstacles for improved SLS and foot clearance in the forwards/lateral direction. Needs min A at times for balance when clearing foot over obstacle anteriorly, performed x3 reps.  With one PT providing resistance and 2nd PT providing min guard, performed resisted lateral stepping to R 2 x 15' and then controlled stepping back to midline, also repeated going to the L 2 x 15'. Pt did well with controlled stepping back to midline and pt able to use stepping strategy to keep balance.  Provided handout on walking program with rollator and importance of walking for exercise (see pt  instructions for further details)   PATIENT EDUCATION: Education details: Continue HEP, discussed adding PWR Flow into exercise routine for additional cognitive challenge, walking program, and gave copy of multi-directional stepping strategy sheet (discussed performing with daughter and not stepping over obstacles at home for HEP)  Person educated: Patient and daughter  Education method: Explanation, Demonstration, and Verbal cues Education comprehension: verbalized understanding, returned demonstration, and needs further education  HOME EXERCISE PROGRAM: From previous bout of therapy: Will review/updated as needed.   Standing and seated PWR, Pt has in her exercise folder.     Access Code: [email or web address] URL: [email or web address] Date: 10/19/2021 Prepared by: [name]   Reviewed/condensed exercises for balance:    Exercises - Alternating Step Backward with Support  - 1 x daily - 5 x weekly - 1-2 sets - 10 reps - Romberg Stance Eyes Closed on Foam Pad  - 1 x daily - 5 x weekly - 3 sets - 30 hold   Plus standing tall against the wall for posture - 2 x 30 second holds    GOALS: Goals reviewed with patient? Yes  SHORT TERM GOALS: ALL STGS = LTGS    LONG TERM GOALS: Target date: 10/05/22  UPDATED/ON-GOING LTGS FOR RE-CERT:    Pt will be independent with final PD and walking program HEP for improved strength, balance, transfers, and gait  Baseline:  Goal status: IN PROGRESS  2.  Pt will verbalize and demonstrate understanding of fall prevention in the home/community (use of appropriate AD, backing up into chairs, freezing strategies) Baseline:  Goal status: IN PROGRESS  3.  Pt will improve TUG time with no AD to 13.5 seconds or less in order to demo decrease fall risk Baseline: 15.25 seconds Goal status: NEW   ASSESSMENT:  CLINICAL IMPRESSION: Pt arrived to session late today, so session was limited. Provided education and handout on walking program to  begin at home and after pt discharges from PT.  Remainder of session focused on stepping strategies, SLS, and standing PWR Flow. Pt did better with stepping strategies/weight shifting today, compared to prior session. Pt able to take larger resisted side steps and weight shift appropriately and when pulled against resistance, pt able to demo appropriate stepping to maintain balance. Will continue per POC.    OBJECTIVE IMPAIRMENTS: Abnormal gait, decreased activity tolerance, decreased balance, decreased cognition, decreased coordination, decreased knowledge of use of DME, decreased mobility, difficulty walking, decreased strength, decreased safety awareness, impaired flexibility, impaired tone, and postural dysfunction.   ACTIVITY LIMITATIONS: carrying, lifting, bending, stairs, transfers, dressing, and locomotion level  PARTICIPATION LIMITATIONS: meal prep, cleaning, driving, shopping, and community activity  PERSONAL FACTORS: Age, Behavior pattern, Past/current experiences, Time since onset of injury/illness/exacerbation, and 3+ comorbidities: PD, glaucoma, osteoporosis (newly diagnosed), TB (as a child)    are also affecting patient's functional outcome.   REHAB POTENTIAL: Good  CLINICAL DECISION MAKING: Evolving/moderate complexity  EVALUATION COMPLEXITY: Moderate  PLAN:  PT FREQUENCY: 1-2x/week  PT DURATION: 4 weeks  PLANNED INTERVENTIONS: Therapeutic exercises, Therapeutic activity, Neuromuscular re-education, Balance training, Gait training, Patient/Family education, Self Care, Stair training, Vestibular training, Visual/preceptual remediation/compensation, DME instructions, Manual therapy, and Re-evaluation  PLAN FOR NEXT SESSION:  Continue to work on gait with cane and rollator - trying obstacles. Review HEP and begin to finalize. Posture awareness as pt with heavy lean to L. Lateral stepping strategies/weight shifting.Continue ball kicks on TM. SLS tasks. Resisted gait.    Arliss Journey, PT, DPT  09/19/2022, 3:20 PM

## 2022-09-19 NOTE — Patient Instructions (Signed)
WALKING  Walking is a great form of exercise to increase your strength, endurance and overall fitness.  A walking program can help you start slowly and gradually build endurance as you go.  Everyone's ability is different, so each person's starting point will be different.  You do not have to follow them exactly.  The are just samples. You should simply find out what's right for you and stick to that program.   In the beginning, you'll start off walking 2-3 times a day for short distances.  As you get stronger, you'll be walking further at just 1-2 times per day.  A. You Can Walk For A Certain Length Of Time Each Day    Walk 5 minutes 3 times per day.  Increase 2 minutes every week or so!  Work up to 20-25 minutes (1-2 times per day).   Example:   Days 1-5 5-6 minutes 3 times per day   Day 6-12 8-10 minutes 2-3 times per day   Day 13-20 12-15 minutes 1-2 times per day    FOCUS ON TALLEST POSTURE, BIG STEPS! Bringing your rollator.

## 2022-09-21 ENCOUNTER — Ambulatory Visit: Payer: Medicare Other | Admitting: Occupational Therapy

## 2022-09-21 ENCOUNTER — Encounter: Payer: Self-pay | Admitting: Occupational Therapy

## 2022-09-21 DIAGNOSIS — R278 Other lack of coordination: Secondary | ICD-10-CM | POA: Diagnosis not present

## 2022-09-21 DIAGNOSIS — R29818 Other symptoms and signs involving the nervous system: Secondary | ICD-10-CM | POA: Diagnosis not present

## 2022-09-21 DIAGNOSIS — R471 Dysarthria and anarthria: Secondary | ICD-10-CM | POA: Diagnosis not present

## 2022-09-21 DIAGNOSIS — M6281 Muscle weakness (generalized): Secondary | ICD-10-CM

## 2022-09-21 DIAGNOSIS — M25612 Stiffness of left shoulder, not elsewhere classified: Secondary | ICD-10-CM

## 2022-09-21 DIAGNOSIS — R2681 Unsteadiness on feet: Secondary | ICD-10-CM | POA: Diagnosis not present

## 2022-09-21 DIAGNOSIS — R29898 Other symptoms and signs involving the musculoskeletal system: Secondary | ICD-10-CM | POA: Diagnosis not present

## 2022-09-21 DIAGNOSIS — R41841 Cognitive communication deficit: Secondary | ICD-10-CM | POA: Diagnosis not present

## 2022-09-21 DIAGNOSIS — R293 Abnormal posture: Secondary | ICD-10-CM

## 2022-09-21 DIAGNOSIS — R2689 Other abnormalities of gait and mobility: Secondary | ICD-10-CM | POA: Diagnosis not present

## 2022-09-21 NOTE — Therapy (Signed)
, OUTPATIENT OCCUPATIONAL THERAPY PARKINSON'S TREATMENT  Patient Name: Denise [name] MRN: [id number] DOB:15-Aug-1950, 72 y.o., female Today's Date: 09/21/2022  PCP: Minette Brine, FNP REFERRING PROVIDER: Dr. Audrea Muscat Thenganatt  END OF SESSION:  OT End of Session - 09/21/22 0847     Visit Number 8    Number of Visits 17    Date for OT Re-Evaluation 11/01/22    Authorization Type BCBS MCR 2024,  No Deductible,  [place]; [place]  Auth Not reqd    Progress Note Due on Visit 10    OT Start Time 0845    OT Stop Time 0930    OT Time Calculation (min) 45 min    Activity Tolerance Patient tolerated treatment well    Behavior During Therapy [facility] for tasks assessed/performed                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   Past Surgical History:  Procedure Laterality Date   TUBAL LIGATION     Patient Active Problem List   Diagnosis Date Noted   Bradycardia 01/17/2022   Parkinson's disease 02/17/2020   Gait abnormality 12/04/2019   Prediabetes 12/03/2018   Vitamin D deficiency 12/03/2018   Anemia due to vitamin B12 deficiency 12/03/2018   Fatigue 12/03/2018   Vitamin B12 deficiency 10/01/2018   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 No recent falls  Pt accompanied by:  daughter  PERTINENT HISTORY:  Parkinson's Disease, last seen by OT 12/2021 (recommended 6 month re-evaluation at OT d/c)  PMH:  Osteopenia, glaucoma, prediabetes, Vitamin D deficiency, anemia due to B12 deficiency, bradycardia    PRECAUTIONS: Fall  WEIGHT BEARING RESTRICTIONS: No  PAIN:  Are you having pain? No  FALLS: Has patient fallen in last 6 months? Yes. Number of falls 2  at granddaughter's dance practice  LIVING ENVIRONMENT: Lives with: lives with their family and lives with their daughter Lives in: House/apartment Stairs: No Has following equipment at home: Grab bars  PLOF: Independent, no longer driving  PATIENT GOALS:improve balance, coordination   OBJECTIVE:   HAND DOMINANCE: Right  ADLs: Overall ADLs: difficulty sequencing at times, uses checklist prior to leaving house Transfers/ambulation related to ADLs: Eating: difficulty cutting food with fork/knife, some difficulty opening bottles/packages Grooming: mod I UB Dressing: difficulty getting shirts over head, difficulty putting earrings in at times LB Dressing: slide in shoes most of the time (can tie) Toileting: some difficulty with sitting down on toilet, some urgency, forgetting to flush Bathing: Washes at the sink in standing   Tub Shower transfers: washes at the sink   IADLs: Light housekeeping: assists daughter  Meal Prep: assists daughter with tasks Community mobility: no longer drives Medication management: dtr supervises/assists Handwriting:  illegible at time per pt  MOBILITY STATUS: Independent and Freezing  POSTURE COMMENTS:  rounded shoulders, forward head, and weight shift left  FUNCTIONAL OUTCOME MEASURES: Physical performance test: PPT#2 (simulated eating) Not tested & PPT#4 (donning/doffing jacket): 24sec  COORDINATION: 9 Hole Peg test: Right: 30.53 sec; Left: 47 sec Box and Blocks:  Right 43blocks, Left 42blocks bradykinesia noted LUE  UE ROM:  BUEs grossly  WFL (approx 140* shoulder flex with elbow ext  bilaterally)  UE MMT:   Not tested  SENSATION: Not tested  MUSCLE TONE: RUE: Within functional limits and LUE: Within functional limits  COGNITION: Overall cognitive status: Impaired  Pt with impaired memory, decr awareness of postural changes, bradyphrenia, and difficulty sequencing at times per dtr  OBSERVATIONS: Bradykinesia, flat affect, decr eye contact and head turns   TODAY'S TREATMENT:                                                                                                                               Developed new PD exercise chart to include new exercises and reviewed w/ pt and daughter. Provided handout (see pt instructions) and worked on which days to do which exercises.  Pt issued community resources and online resources and reviewed w/ pt and daughter  Roma Kayser assessing STG's for renewal (see STG section below)    PATIENT EDUCATION: See above  HOME EXERCISE PROGRAM:  1/245/24: Shoulder flex, PWR! Hands, coordination HEP  08/23/22- PWR1 basic 4 in seated 09/06/22: future PD related complications, handwriting strategies 09/12/22: ADL strategies, strategies for donning/doffing jacket, bag HEP 09/21/22; PD ex chart, community resources   GOALS: Potential Goals reviewed with patient? Yes  SHORT TERM GOALS: Target date: 08/25/22  Pt will be independent with updated HEP. Goal status: MET  2.  Pt will report incr ease with donning pull-over shirt without assist consistently. Goal status: MET  3.  Pt will verbalize understanding of ways to prevent future PD related complications and appropriate community resources. Goal status: MET  4.  Pt will be able to write at least 2 sentences with good legibility and size. Goal status: MET 07/28/22   LONG TERM GOALS: Target date: 09/23/22  Pt/caregiver will verbalize understanding of adaptive strategies/AE to incr safety and ease with ADLs/IADLs. Goal status: MET  2.  Pt will don/doff jacket in less than 20sec for  incr ease using strategies prn. Baseline:  24sec Goal status: IN PROGRESS  3.  Pt will improve L hand coordination for ADLs/IADLs as shown by decr time on 9-hole peg test by at least 8sec.  Baseline:  47sec Goal status: in progress  4.  Pt will improve functional reaching/coordination as shown by improving score on box and blocks test by at least 4 bilaterally. Baseline: R-43, L-42 Goal status: INITIAL  5.  Pt/dtr will verbalize understanding of cognitive compensation strategies for ADLs/IADLs. Goal status: INITIAL   ASSESSMENT:  CLINICAL IMPRESSION: Pt has met all STG's and 1 LTG at this time. Renewal completed today to cover additional visits up to 11/01/22 to address  remaining goals  PERFORMANCE DEFICITS: in functional skills including ADLs, IADLs, coordination, dexterity, Fine motor control, mobility, balance, decreased knowledge of use of DME, and UE functional use, cognitive skills including memory, safety awareness, and sequencing, and psychosocial skills including environmental adaptation and routines and behaviors.   IMPAIRMENTS: are limiting patient from ADLs, IADLs, and leisure.   COMORBIDITIES:  may have co-morbidities  that affects occupational performance. Patient will benefit from skilled OT to address above impairments and improve overall function.  MODIFICATION OR ASSISTANCE TO COMPLETE EVALUATION: Min-Moderate modification of tasks or assist with assess necessary to complete an evaluation.  OT OCCUPATIONAL PROFILE AND HISTORY: Detailed assessment: Review of records and additional review of physical, cognitive, psychosocial history related to current functional performance.  CLINICAL DECISION MAKING: Moderate - several treatment options, min-mod task modification necessary  REHAB POTENTIAL: Good  EVALUATION COMPLEXITY: Moderate   PLAN:  OT FREQUENCY: 2x/wk   OT DURATION: for additional 5 weeks (up to 11/01/22, but may finish earlier)   PLANNED INTERVENTIONS:  self care/ADL training, therapeutic exercise, therapeutic activity, neuromuscular re-education, manual therapy, passive range of motion, balance training, functional mobility training, splinting, paraffin, fluidotherapy, moist heat, cryotherapy, patient/family education, cognitive remediation/compensation, and DME and/or AE instructions  RECOMMENDED OTHER SERVICES: current with PT, ST  CONSULTED AND AGREED WITH PLAN OF CARE: Patient and family Midwife (dtr)  PLAN FOR NEXT SESSION:  continue coordination, issue "keeping thinking skills sharp", progress towards remaining goals   Hans Eden, OTR/L 09/21/2022, 9:24 AM

## 2022-09-21 NOTE — Patient Instructions (Signed)
(  Exercise) Monday Tuesday Wednesday Thursday Friday Saturday Sunday   PWR! Lying on back           Bag exercises           PWR! seated           PWR! Standing           PWR! Hands           Balance/PT exercises           Posture           Walking           Diagonals          Stepping/ Cognition         Coordination

## 2022-09-26 ENCOUNTER — Ambulatory Visit: Payer: Medicare Other | Admitting: Occupational Therapy

## 2022-09-26 ENCOUNTER — Encounter: Payer: Self-pay | Admitting: Occupational Therapy

## 2022-09-26 ENCOUNTER — Ambulatory Visit: Payer: Medicare Other | Admitting: Physical Therapy

## 2022-09-26 DIAGNOSIS — R29898 Other symptoms and signs involving the musculoskeletal system: Secondary | ICD-10-CM | POA: Diagnosis not present

## 2022-09-26 DIAGNOSIS — R293 Abnormal posture: Secondary | ICD-10-CM | POA: Diagnosis not present

## 2022-09-26 DIAGNOSIS — R2689 Other abnormalities of gait and mobility: Secondary | ICD-10-CM | POA: Diagnosis not present

## 2022-09-26 DIAGNOSIS — M6281 Muscle weakness (generalized): Secondary | ICD-10-CM | POA: Diagnosis not present

## 2022-09-26 DIAGNOSIS — R2681 Unsteadiness on feet: Secondary | ICD-10-CM

## 2022-09-26 DIAGNOSIS — M25612 Stiffness of left shoulder, not elsewhere classified: Secondary | ICD-10-CM | POA: Diagnosis not present

## 2022-09-26 DIAGNOSIS — R278 Other lack of coordination: Secondary | ICD-10-CM | POA: Diagnosis not present

## 2022-09-26 DIAGNOSIS — R471 Dysarthria and anarthria: Secondary | ICD-10-CM | POA: Diagnosis not present

## 2022-09-26 DIAGNOSIS — R41841 Cognitive communication deficit: Secondary | ICD-10-CM | POA: Diagnosis not present

## 2022-09-26 DIAGNOSIS — R29818 Other symptoms and signs involving the nervous system: Secondary | ICD-10-CM | POA: Diagnosis not present

## 2022-09-26 NOTE — Patient Instructions (Signed)
   Keeping Thinking Skills Sharp: 1. Jigsaw puzzles 2. Card/board games 3. Talking on the phone/social events 4. [email or web address] 5. Online games 6. Word serches/crossword puzzles 7.  Logic puzzles 8. Aerobic exercise (stationary bike) 9. Eating balanced diet (fruits & veggies) 10. Drink water 11. Try something new--new recipe, hobby 12. Crafts 13. Do a variety of activities that are challenging 14.  Plan weekly meals and write a grocery list 15. Add cognitive activities to walking/exercising (think of animal/food/city with each letter of the alphabet, counting backwards, thinking of as many vegetables as you can, etc.).--Only do this  If safe (no freezing/falls).  

## 2022-09-26 NOTE — Therapy (Signed)
OUTPATIENT PHYSICAL THERAPY NEURO TREATMENT   Patient Name: Denise [name] MRN: [id number] DOB:Jul 12, 1950, 72 y.o., female Today's Date: 09/26/2022   PCP: Minette Brine, FNP  REFERRING PROVIDER: Buck Mam Audrea Muscat, MD     END OF SESSION:  PT End of Session - 09/26/22 1449     Visit Number 16    Number of Visits 17    Date for PT Re-Evaluation 10/05/22    Authorization Type BCBS    PT Start Time 1448    PT Stop Time 1525   Pt requesting to go to bathroom   PT Time Calculation (min) 37 min    Equipment Utilized During Treatment Gait belt    Activity Tolerance Patient tolerated treatment well    Behavior During Therapy [facility] for tasks assessed/performed                      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   Past Surgical History:  Procedure Laterality Date   TUBAL LIGATION     Patient Active Problem List   Diagnosis Date Noted   Bradycardia 01/17/2022   Parkinson's disease 02/17/2020   Gait abnormality 12/04/2019   Prediabetes 12/03/2018   Vitamin D deficiency 12/03/2018   Anemia due to vitamin B12 deficiency 12/03/2018   Fatigue 12/03/2018   Vitamin B12 deficiency 10/01/2018   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    Pt accompanied by: family member daughter   PERTINENT HISTORY: PD, glaucoma, osteoporosis (newly diagnosed), TB (as a child)    PAIN:  Are you having pain? No  PRECAUTIONS: Fall and Other: Osteoporosis  WEIGHT BEARING RESTRICTIONS: No  FALLS: Has patient fallen in last 6 months? Yes. Number of falls 2  LIVING ENVIRONMENT: Lives with: lives with their family - daughter and  granddaughter  Lives in: House/apartment Stairs: Yes: External: 2 steps; none Has following equipment at home: Single point cane and Grab bars  PLOF: Independent, pt's daughter reports sometimes pt needs help with dressing   PATIENT GOALS: See how she was since the last time she was here. Work on the balance   OBJECTIVE:  TODAY'S TREATMENT:     Ther Act  Discussed 6 mo PD evals vs screens and differences. Pt preferring screens at this time.   NMR Pt and daughter report having difficulty with performing stand <>sit transfers at home, as pt will get partially through transfer and freeze, unable to fully sit. Pt reports feeling as though she "cannot move" during this. Attempted to have pt count to 4 as she sat down, with her bottom touching the chair when she gets to 4. However, pt unsuccessful with this cue and continues to freeze mid-transfer. Added extra appointment in pt's POC to continue working on this, as therapist unable to find cue that work for pt this date. Encouraged pt to try counting method at home and report back next week.     PATIENT EDUCATION: Education details: Continue HEP, trying counting cue for stand <>Sit transfers  Person educated: Patient and daughter  Education method: Explanation, Demonstration, and Verbal cues Education comprehension: verbalized understanding, returned demonstration, and needs further education  HOME EXERCISE PROGRAM: From previous bout of therapy: Will review/updated as needed.   Standing and seated PWR, Pt has in her  exercise folder.     Access Code: [email or web address] URL: [email or web address] Date: 10/19/2021 Prepared by: [name]   Reviewed/condensed exercises for balance:    Exercises - Alternating Step Backward with Support  - 1 x daily - 5 x weekly - 1-2 sets - 10 reps - Romberg Stance Eyes Closed on Foam Pad  - 1 x daily - 5 x weekly - 3 sets - 30 hold   Plus standing tall against the wall for posture - 2 x 30 second holds    GOALS: Goals reviewed with patient? Yes  SHORT TERM GOALS: ALL STGS = LTGS    LONG TERM GOALS: Target date: 10/05/22  UPDATED/ON-GOING LTGS FOR RE-CERT:    Pt will be independent with final PD and walking program HEP for improved strength, balance, transfers, and gait  Baseline:  Goal status: IN PROGRESS  2.  Pt will verbalize and demonstrate understanding of fall prevention in the home/community (use of appropriate AD, backing up into chairs, freezing strategies) Baseline:  Goal status: IN PROGRESS  3.  Pt will improve TUG time with no AD to 13.5 seconds or less in order to demo decrease fall risk Baseline: 15.25 seconds Goal status: NEW   ASSESSMENT:  CLINICAL IMPRESSION: Session limited as pt needed to leave early to use restroom. Emphasis of skilled PT session on determining proper cue to assist pt to perform stand <>sit transfer without freezing. Attempted counting cue, but this was not helpful for pt. Added extra PT appointment to allow more time to work on this in clinic. Continue POC.    OBJECTIVE IMPAIRMENTS: Abnormal gait, decreased activity tolerance, decreased balance, decreased cognition, decreased coordination, decreased knowledge of use of DME, decreased mobility, difficulty walking, decreased strength, decreased safety awareness, impaired flexibility, impaired tone, and postural dysfunction.   ACTIVITY LIMITATIONS: carrying, lifting, bending, stairs, transfers, dressing, and locomotion level  PARTICIPATION LIMITATIONS: meal  prep, cleaning, driving, shopping, and community activity  PERSONAL FACTORS: Age, Behavior pattern, Past/current experiences, Time since onset of injury/illness/exacerbation, and 3+  comorbidities: PD, glaucoma, osteoporosis (newly diagnosed), TB (as a child)    are also affecting patient's functional outcome.   REHAB POTENTIAL: Good  CLINICAL DECISION MAKING: Evolving/moderate complexity  EVALUATION COMPLEXITY: Moderate  PLAN:  PT FREQUENCY: 1-2x/week  PT DURATION: 4 weeks  PLANNED INTERVENTIONS: Therapeutic exercises, Therapeutic activity, Neuromuscular re-education, Balance training, Gait training, Patient/Family education, Self Care, Stair training, Vestibular training, Visual/preceptual remediation/compensation, DME instructions, Manual therapy, and Re-evaluation  PLAN FOR NEXT SESSION:  Continue to work on gait with cane and rollator - trying obstacles. Review HEP and begin to finalize. Posture awareness as pt with heavy lean to L. Lateral stepping strategies/weight shifting.Continue ball kicks on TM. SLS tasks. Resisted gait. Freezing with stand <>sit cues   Cruzita Lederer Adiana Smelcer, PT, DPT  09/26/2022, 3:30 PM

## 2022-09-26 NOTE — Therapy (Signed)
, OUTPATIENT OCCUPATIONAL THERAPY PARKINSON'S TREATMENT  Patient Name: Denise [name] MRN: [id number] DOB:[birth year], 72 y.o., female Today's Date: 09/26/2022  PCP: Minette Brine, FNP REFERRING PROVIDER: Dr. Audrea Muscat Thenganatt  END OF SESSION:  OT End of Session - 09/26/22 1410     Visit Number 9    Number of Visits 17    Date for OT Re-Evaluation 11/01/22    Authorization Type BCBS MCR 2024,  No Deductible,  [place]; [place]  Auth Not reqd    Progress Note Due on Visit 10    OT Start Time 1413    OT Stop Time 1445    OT Time Calculation (min) 32 min    Activity Tolerance Patient tolerated treatment well    Behavior During Therapy [facility] for tasks assessed/performed             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   Past Surgical History:  Procedure Laterality Date   TUBAL LIGATION     Patient Active Problem List   Diagnosis Date Noted   Bradycardia 01/17/2022   Parkinson's disease 02/17/2020   Gait abnormality 12/04/2019   Prediabetes 12/03/2018   Vitamin D deficiency 12/03/2018   Anemia due to vitamin B12 deficiency 12/03/2018   Fatigue 12/03/2018   Vitamin B12 deficiency 10/01/2018   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 No recent falls  Pt accompanied by:  daughter  PERTINENT HISTORY: Parkinson's Disease,  last seen by OT 12/2021 (recommended 6 month re-evaluation at OT d/c)  PMH:  Osteopenia, glaucoma, prediabetes, Vitamin D deficiency, anemia due to B12 deficiency, bradycardia    PRECAUTIONS: Fall  WEIGHT BEARING RESTRICTIONS: No  PAIN:  Are you having pain? No  FALLS: Has patient fallen in last 6 months? Yes. Number of falls 2  at granddaughter's dance practice  LIVING ENVIRONMENT: Lives with: lives with their family and lives with their daughter Lives in: House/apartment Stairs: No Has following equipment at home: Grab bars  PLOF: Independent, no longer driving  PATIENT GOALS:improve balance, coordination   OBJECTIVE:   HAND DOMINANCE: Right  ADLs: Overall ADLs: difficulty sequencing at times, uses checklist prior to leaving house Transfers/ambulation related to ADLs: Eating: difficulty cutting food with fork/knife, some difficulty opening bottles/packages Grooming: mod I UB Dressing: difficulty getting shirts over head, difficulty putting earrings in at times LB Dressing: slide in shoes most of the time (can tie) Toileting: some difficulty with sitting down on toilet, some urgency, forgetting to flush Bathing: Washes at the sink in standing   Tub Shower transfers: washes at the sink   IADLs: Light housekeeping: assists daughter  Meal Prep: assists daughter with tasks Community mobility: no longer drives Medication management: dtr supervises/assists Handwriting:  illegible at time per pt  MOBILITY STATUS: Independent and Freezing  POSTURE COMMENTS:  rounded shoulders, forward head, and weight shift  left  FUNCTIONAL OUTCOME MEASURES: Physical performance test: PPT#2 (simulated eating) Not tested & PPT#4 (donning/doffing jacket): 24sec  COORDINATION: 9 Hole Peg test: Right: 30.53 sec; Left: 47 sec Box and Blocks:  Right 43blocks, Left 42blocks bradykinesia noted LUE  UE ROM:  BUEs grossly  WFL (approx 140* shoulder flex with elbow ext bilaterally)  UE MMT:    Not tested  SENSATION: Not tested  MUSCLE TONE: RUE: Within functional limits and LUE: Within functional limits  COGNITION: Overall cognitive status: Impaired  Pt with impaired memory, decr awareness of postural changes, bradyphrenia, and difficulty sequencing at times per dtr  OBSERVATIONS: Bradykinesia, flat affect, decr eye contact and head turns   TODAY'S TREATMENT:                                                                                                                              - Therapeutic activities completed for duration as noted below including:  Reviewed Coordination activities  OT initiated thinking skills sharp as noted in pt instructions  PATIENT EDUCATION: See above  HOME EXERCISE PROGRAM:  1/245/24: Shoulder flex, PWR! Hands, coordination HEP  08/23/22- PWR1 basic 4 in seated 09/06/22: future PD related complications, handwriting strategies 09/12/22: ADL strategies, strategies for donning/doffing jacket, bag HEP 09/21/22; PD ex chart, community resources   GOALS: Potential Goals reviewed with patient? Yes  SHORT TERM GOALS: Target date: 08/25/22  Pt will be independent with updated HEP. Goal status: MET  2.  Pt will report incr ease with donning pull-over shirt without assist consistently. Goal status: MET  3.  Pt will verbalize understanding of ways to prevent future PD related complications and appropriate community resources. Goal status: MET  4.  Pt will be able to write at least 2 sentences with good legibility and size. Goal status: MET 07/28/22   LONG TERM GOALS: Target date: 09/23/22  Pt/caregiver will verbalize understanding of adaptive strategies/AE to incr safety and ease with ADLs/IADLs. Goal status: MET  2.  Pt will don/doff jacket in less than 20sec for incr ease using strategies prn. Baseline:  24sec Goal status: IN PROGRESS  3.  Pt will improve L hand coordination for ADLs/IADLs as shown by decr time on 9-hole peg test by at  least 8sec.  Baseline:  47sec Goal status: in progress  4.  Pt will improve functional reaching/coordination as shown by improving score on box and blocks test by at least 4 bilaterally. Baseline: R-43, L-42 Goal status: INITIAL  5.  Pt/dtr will verbalize understanding of cognitive compensation strategies for ADLs/IADLs. Goal status: INITIAL   ASSESSMENT:  CLINICAL IMPRESSION: Pt has met all STG's and 1 LTG at this time. Renewal completed today to cover additional visits up to 11/01/22 to address remaining goals  PERFORMANCE DEFICITS: in functional skills including ADLs, IADLs, coordination, dexterity, Fine motor control, mobility, balance, decreased knowledge of use of DME, and UE functional use, cognitive skills including memory, safety awareness, and sequencing, and psychosocial  skills including environmental adaptation and routines and behaviors.   IMPAIRMENTS: are limiting patient from ADLs, IADLs, and leisure.   COMORBIDITIES:  may have co-morbidities  that affects occupational performance. Patient will benefit from skilled OT to address above impairments and improve overall function.  MODIFICATION OR ASSISTANCE TO COMPLETE EVALUATION: Min-Moderate modification of tasks or assist with assess necessary to complete an evaluation.  OT OCCUPATIONAL PROFILE AND HISTORY: Detailed assessment: Review of records and additional review of physical, cognitive, psychosocial history related to current functional performance.  CLINICAL DECISION MAKING: Moderate - several treatment options, min-mod task modification necessary  REHAB POTENTIAL: Good  EVALUATION COMPLEXITY: Moderate   PLAN:  OT FREQUENCY: 2x/wk   OT DURATION: for additional 5 weeks (up to 11/01/22, but may finish earlier)   PLANNED INTERVENTIONS: self care/ADL training, therapeutic exercise, therapeutic activity, neuromuscular re-education, manual therapy, passive range of motion, balance training, functional mobility  training, splinting, paraffin, fluidotherapy, moist heat, cryotherapy, patient/family education, cognitive remediation/compensation, and DME and/or AE instructions  RECOMMENDED OTHER SERVICES: current with PT, ST  CONSULTED AND AGREED WITH PLAN OF CARE: Patient and family Midwife (dtr)  PLAN FOR NEXT SESSION:  continue coordination, progress towards remaining goals   Dennis Bast, OTR/L 09/26/2022, 5:32 PM

## 2022-10-03 ENCOUNTER — Ambulatory Visit: Payer: Medicare Other | Admitting: Physical Therapy

## 2022-10-03 ENCOUNTER — Ambulatory Visit: Payer: Medicare Other | Admitting: Occupational Therapy

## 2022-10-05 ENCOUNTER — Ambulatory Visit: Payer: Medicare Other | Admitting: Physical Therapy

## 2022-10-05 ENCOUNTER — Ambulatory Visit: Payer: Medicare Other | Admitting: Occupational Therapy

## 2022-10-12 ENCOUNTER — Ambulatory Visit: Payer: Medicare Other | Attending: Nurse Practitioner | Admitting: Occupational Therapy

## 2022-10-12 ENCOUNTER — Encounter: Payer: Self-pay | Admitting: Occupational Therapy

## 2022-10-12 ENCOUNTER — Ambulatory Visit: Payer: Medicare Other | Admitting: Physical Therapy

## 2022-10-12 DIAGNOSIS — R2681 Unsteadiness on feet: Secondary | ICD-10-CM

## 2022-10-12 DIAGNOSIS — R29818 Other symptoms and signs involving the nervous system: Secondary | ICD-10-CM

## 2022-10-12 DIAGNOSIS — M25612 Stiffness of left shoulder, not elsewhere classified: Secondary | ICD-10-CM | POA: Diagnosis not present

## 2022-10-12 DIAGNOSIS — M6281 Muscle weakness (generalized): Secondary | ICD-10-CM

## 2022-10-12 DIAGNOSIS — R2689 Other abnormalities of gait and mobility: Secondary | ICD-10-CM

## 2022-10-12 DIAGNOSIS — R278 Other lack of coordination: Secondary | ICD-10-CM | POA: Diagnosis not present

## 2022-10-12 DIAGNOSIS — R293 Abnormal posture: Secondary | ICD-10-CM

## 2022-10-12 NOTE — Therapy (Signed)
, OUTPATIENT OCCUPATIONAL THERAPY PARKINSON'S TREATMENT  Patient Name: Denise [name] MRN: [id number] DOB:[birth year], 72 y.o., female Today's Date: 10/12/2022  PCP: [name], FNP REFERRING PROVIDER: Dr. Chales Abrahams Thenganatt  END OF SESSION:  OT End of Session - 10/12/22 1408     Visit Number 10    Number of Visits 17    Date for OT Re-Evaluation 11/01/22    Authorization Type BCBS MCR 2024,  No Deductible,  [place]; [place]  Auth Not reqd    Progress Note Due on Visit 10    OT Start Time 1405    OT Stop Time 1445    OT Time Calculation (min) 40 min    Activity Tolerance Patient tolerated treatment well    Behavior During Therapy [facility] for tasks assessed/performed             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   Past Surgical History:  Procedure Laterality Date   TUBAL LIGATION     Patient Active Problem List   Diagnosis Date Noted   Bradycardia 01/17/2022   Parkinson's disease 02/17/2020   Gait abnormality 12/04/2019   Prediabetes 12/03/2018   Vitamin D deficiency 12/03/2018   Anemia due to vitamin B12 deficiency 12/03/2018   Fatigue 12/03/2018   Vitamin B12 deficiency 10/01/2018   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   Abnormal auditory perception of both ears 05/10/2016   Osteopenia 08/23/2012    ONSET DATE: 07/02/23  REFERRING DIAG: Parkinson's Disease  THERAPY DIAG:  Other lack of coordination  Muscle weakness (generalized)  Abnormal posture  Stiffness of left shoulder, not elsewhere classified  Unsteadiness on feet  Other symptoms and signs involving the nervous system  Rationale for Evaluation and Treatment: Rehabilitation  SUBJECTIVE:   SUBJECTIVE STATEMENT: I've been good  Pt accompanied by:  daughter  PERTINENT HISTORY: Parkinson's Disease,  last seen by OT 12/2021 (recommended 6 month re-evaluation at OT d/c)  PMH:  Osteopenia, glaucoma, prediabetes, Vitamin D deficiency, anemia due to B12 deficiency, bradycardia    PRECAUTIONS: Fall  WEIGHT BEARING RESTRICTIONS: No  PAIN:  Are you having pain? No  FALLS: Has patient fallen in last 6 months? Yes. Number of falls 2  at granddaughter's dance practice  LIVING ENVIRONMENT: Lives with: lives with their family and lives with their daughter Lives in: House/apartment Stairs: No Has following equipment at home: Grab bars  PLOF: Independent, no longer driving  PATIENT GOALS:improve balance, coordination   OBJECTIVE:   HAND DOMINANCE: Right  ADLs: Overall ADLs: difficulty sequencing at times, uses checklist prior to leaving house Transfers/ambulation related to ADLs: Eating: difficulty cutting food with fork/knife, some difficulty opening bottles/packages Grooming: mod I UB Dressing: difficulty getting shirts over head, difficulty putting earrings in at times LB Dressing: slide in shoes most of the time (can tie) Toileting: some difficulty with sitting down on toilet, some urgency, forgetting to flush Bathing: Washes at the sink in standing   Tub Shower transfers: washes at the sink   IADLs: Light housekeeping: assists daughter  Meal Prep: assists daughter with tasks Community mobility: no longer drives Medication management: dtr supervises/assists Handwriting:  illegible at time per pt  MOBILITY STATUS: Independent and Freezing  POSTURE COMMENTS:  rounded shoulders, forward head, and weight  shift left  FUNCTIONAL OUTCOME MEASURES: Physical performance test: PPT#2 (simulated eating) Not tested & PPT#4 (donning/doffing jacket): 24sec  COORDINATION: 9 Hole Peg test: Right: 30.53 sec; Left: 47 sec Box and Blocks:  Right 43blocks, Left 42blocks bradykinesia noted LUE  UE ROM:  BUEs grossly  WFL (approx 140* shoulder flex with elbow ext bilaterally)  UE MMT:    Not tested  SENSATION: Not tested  MUSCLE TONE: RUE: Within functional limits and LUE: Within functional limits  COGNITION: Overall cognitive status: Impaired  Pt with impaired memory, decr awareness of postural changes, bradyphrenia, and difficulty sequencing at times per dtr  OBSERVATIONS: Bradykinesia, flat affect, decr eye contact and head turns   TODAY'S TREATMENT:                                                                                                                              - Pt/daughter issued most current community and online resources Reviewed keeping thinking skills sharp and discussed memory strategies (already issued by speech therapy)  Standing to perform coordination task to copy peg design for bilateral UE reaching, bilateral weight shift, coordination, dynamic standing, and visual/perceptual skills. Pt required occasional min cueing but mostly copied design correctly. Pt w/ slower processing and took extra time to copy  PATIENT EDUCATION: See above  HOME EXERCISE PROGRAM:  1/245/24: Shoulder flex, PWR! Hands, coordination HEP  08/23/22- PWR1 basic 4 in seated 09/06/22: future PD related complications, handwriting strategies 09/12/22: ADL strategies, strategies for donning/doffing jacket, bag HEP 09/21/22; PD ex chart, community resources 09/26/22: keeping thinking skills sharp   GOALS: Potential Goals reviewed with patient? Yes  SHORT TERM GOALS: Target date: 08/25/22  Pt will be independent with updated HEP. Goal status: MET  2.  Pt will report incr ease with donning pull-over shirt without assist consistently. Goal status: MET  3.  Pt will verbalize understanding of ways to prevent future PD related complications and appropriate community resources. Goal status: MET  4.  Pt will be able to write at least 2 sentences with good legibility and size. Goal status: MET 07/28/22   LONG TERM GOALS: Target date: 09/23/22  Pt/caregiver will verbalize  understanding of adaptive strategies/AE to incr safety and ease with ADLs/IADLs. Goal status: MET  2.  Pt will don/doff jacket in less than 20sec for incr ease using strategies prn. Baseline:  24sec Goal status: MET (15 sec)   3.  Pt will improve L hand coordination for ADLs/IADLs as shown by decr time on 9-hole peg test by at least 8sec.  Baseline:  47sec Goal status: in progress  4.  Pt will improve functional reaching/coordination as shown by improving score on box and blocks test by at least 4 bilaterally. Baseline: R-43, L-42 Goal status: IN PROGRESS  5.  Pt/dtr will verbalize understanding of cognitive compensation strategies for ADLs/IADLs. Goal status: IN PROGRESS  ASSESSMENT:  CLINICAL IMPRESSION: This 10th progress note is for dates: 07/26/22 to 10/12/22: PT  has met all STG's and 2 LTG's at this time. Pt progressing towards remaining goals. Pt has demo improvements in ADLS and coordination  PERFORMANCE DEFICITS: in functional skills including ADLs, IADLs, coordination, dexterity, Fine motor control, mobility, balance, decreased knowledge of use of DME, and UE functional use, cognitive skills including memory, safety awareness, and sequencing, and psychosocial skills including environmental adaptation and routines and behaviors.   IMPAIRMENTS: are limiting patient from ADLs, IADLs, and leisure.   COMORBIDITIES:  may have co-morbidities  that affects occupational performance. Patient will benefit from skilled OT to address above impairments and improve overall function.  MODIFICATION OR ASSISTANCE TO COMPLETE EVALUATION: Min-Moderate modification of tasks or assist with assess necessary to complete an evaluation.  OT OCCUPATIONAL PROFILE AND HISTORY: Detailed assessment: Review of records and additional review of physical, cognitive, psychosocial history related to current functional performance.  CLINICAL DECISION MAKING: Moderate - several treatment options, min-mod task  modification necessary  REHAB POTENTIAL: Good  EVALUATION COMPLEXITY: Moderate   PLAN:  OT FREQUENCY: 2x/wk   OT DURATION: for additional 5 weeks (up to 11/01/22, but may finish earlier)   PLANNED INTERVENTIONS: self care/ADL training, therapeutic exercise, therapeutic activity, neuromuscular re-education, manual therapy, passive range of motion, balance training, functional mobility training, splinting, paraffin, fluidotherapy, moist heat, cryotherapy, patient/family education, cognitive remediation/compensation, and DME and/or AE instructions  RECOMMENDED OTHER SERVICES: current with PT, ST  CONSULTED AND AGREED WITH PLAN OF CARE: Patient and family Adult nursemember/caregiver (dtr)  PLAN FOR NEXT SESSION:  continue coordination, toss scarf, UBE, progress towards remaining goals   Sheran LawlessKelly J Mennie Spiller, OTR/L 10/12/2022, 2:09 PM

## 2022-10-12 NOTE — Therapy (Signed)
OUTPATIENT PHYSICAL THERAPY NEURO TREATMENT- RECERTIFICATION   Patient Name: Arreon Arend MRN: [id number] DOB:06-11-1951, 72 y.o., female Today's Date: 10/13/2022   PCP: [name], FNP  REFERRING PROVIDER: Evern Bio Chales Abrahams, MD     END OF SESSION:  PT End of Session - 10/12/22 1453     Visit Number 17    Number of Visits 19   Recert   Date for PT Re-Evaluation 11/02/22   Recert   Authorization Type BCBS    PT Start Time 1451   Handoff w/OT   PT Stop Time 1526   Pt needing to use restroom   PT Time Calculation (min) 35 min    Equipment Utilized During Treatment Gait belt    Activity Tolerance Patient tolerated treatment well    Behavior During Therapy WFL for tasks assessed/performed                       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   Past Surgical History:  Procedure Laterality Date   TUBAL LIGATION     Patient Active Problem List   Diagnosis Date Noted   Bradycardia 01/17/2022   Parkinson's disease 02/17/2020   Gait abnormality 12/04/2019   Prediabetes 12/03/2018   Vitamin D deficiency 12/03/2018   Anemia due to vitamin B12 deficiency 12/03/2018   Fatigue 12/03/2018   Vitamin B12 deficiency 10/01/2018   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   Abnormal auditory perception of both ears 05/10/2016   Osteopenia 08/23/2012    ONSET DATE:  06/30/2022  REFERRING DIAG: G20.A1 (ICD-10-CM) - Parkinsons   THERAPY DIAG:  Muscle weakness (generalized) - Plan: PT plan of care cert/re-cert  Unsteadiness on feet - Plan: PT plan of care cert/re-cert  Other abnormalities of gait and mobility - Plan: PT plan of care cert/re-cert  Rationale for Evaluation and Treatment: Rehabilitation  SUBJECTIVE:                                                                                                                                                                                              SUBJECTIVE STATEMENT: Pt reports doing well, denies acute changes. Still having difficulty sitting to chair. No falls    Pt accompanied by: family member daughter   PERTINENT HISTORY: PD, glaucoma, osteoporosis (newly diagnosed), TB (as a child)    PAIN:  Are  you having pain? No  PRECAUTIONS: Fall and Other: Osteoporosis  WEIGHT BEARING RESTRICTIONS: No  FALLS: Has patient fallen in last 6 months? Yes. Number of falls 2  LIVING ENVIRONMENT: Lives with: lives with their family - daughter and granddaughter  Lives in: House/apartment Stairs: Yes: External: 2 steps; none Has following equipment at home: Single point cane and Grab bars  PLOF: Independent, pt's daughter reports sometimes pt needs help with dressing   PATIENT GOALS: See how she was since the last time she was here. Work on the balance   OBJECTIVE:  TODAY'S TREATMENT:     Ther Ex SciFit multi-peaks level 7 for 8 minutes using BUE/BLEs for neural priming for reciprocal movement, dynamic cardiovascular warmup and increased amplitude of stepping.   NMR With back against wall, practiced sliding hands down thighs and facilitating hip hinge ("touch butt to wall") to promote anterior weight shift w/stand <>sit and boost pt's confidence with this transfer. Pt performed 10 reps well, so progressed to performing in front of mat x10 reps and then performed full stand <>sit x5 at mat using technique with no freezing noted. Pt ecstatic with ability to perform transfer well and later transferred to standard arm chair x5 reps and performed stand <>sit x3 to chair with no issue.  Using 16" box placed in catty corner position posterior to pt, performed x20 KB DL using 5# KB to continue strengthening and priming hip hinge movement. Pt really enjoyed this activity, so encouraged pt to work on it at  home.   Ther Act   [facility] 10/12/22 1521       Timed Up and Go Test   Normal TUG (seconds) 13.15   No AD             PATIENT EDUCATION: Education details: Continue HEP, plan to DC next session  Person educated: Patient and daughter  Education method: Explanation, Demonstration, and Verbal cues Education comprehension: verbalized understanding, returned demonstration, and needs further education  HOME EXERCISE PROGRAM: From previous bout of therapy: Will review/updated as needed.   Standing and seated PWR, Pt has in her exercise folder.     Access Code: [email or web address] URL: [email or web address] Date: 10/19/2021 Prepared by: Sherlie Banhloe Gilgannon   Reviewed/condensed exercises for balance:    Exercises - Alternating Step Backward with Support  - 1 x daily - 5 x weekly - 1-2 sets - 10 reps - Romberg Stance Eyes Closed on Foam Pad  - 1 x daily - 5 x weekly - 3 sets - 30 hold   Plus standing tall against the wall for posture - 2 x 30 second holds    GOALS: Goals reviewed with patient? Yes  SHORT TERM GOALS: ALL STGS = LTGS    LONG TERM GOALS: Target date: 10/26/2022  UPDATED/ON-GOING LTGS FOR RE-CERT:    Pt will be independent with final PD and walking program HEP for improved strength, balance, transfers, and gait  Baseline:  Goal status: IN PROGRESS  2.  Pt will verbalize and demonstrate understanding of fall prevention in the home/community (use of appropriate AD, backing up into chairs, freezing strategies) Baseline:  Goal status: IN PROGRESS  3.  Pt will improve TUG time with no AD to 13.5 seconds or less in order to demo decrease fall risk Baseline: 15.25 seconds; 13.15s on 4/10  Goal status: MET   ASSESSMENT:  CLINICAL IMPRESSION: Emphasis of skilled PT session on endurance, forward trunk lean, facilitation of hip hinge required for stand <>sit and  LTG assessment. Pt able to facilitate hip hinge w/cue to slide hands down thighs in  order to perform stand <>sit without fear or freezing. Pt very excited with ability to sit without freezing and enjoyed DL activity, so daughter plans to recreate this at home. Pt has met LTG #3, improving her TUG time to 13.15s without AD and no freezing. Pt on track and in agreement to DC next session. Continue POC.    OBJECTIVE IMPAIRMENTS: Abnormal gait, decreased activity tolerance, decreased balance, decreased cognition, decreased coordination, decreased knowledge of use of DME, decreased mobility, difficulty walking, decreased strength, decreased safety awareness, impaired flexibility, impaired tone, and postural dysfunction.   ACTIVITY LIMITATIONS: carrying, lifting, bending, stairs, transfers, dressing, and locomotion level  PARTICIPATION LIMITATIONS: meal prep, cleaning, driving, shopping, and community activity  PERSONAL FACTORS: Age, Behavior pattern, Past/current experiences, Time since onset of injury/illness/exacerbation, and 3+ comorbidities: PD, glaucoma, osteoporosis (newly diagnosed), TB (as a child)    are also affecting patient's functional outcome.   REHAB POTENTIAL: Good  CLINICAL DECISION MAKING: Evolving/moderate complexity  EVALUATION COMPLEXITY: Moderate  PLAN:  PT FREQUENCY: 1-2x/week  PT DURATION: 4 weeks  PLANNED INTERVENTIONS: Therapeutic exercises, Therapeutic activity, Neuromuscular re-education, Balance training, Gait training, Patient/Family education, Self Care, Stair training, Vestibular training, Visual/preceptual remediation/compensation, DME instructions, Manual therapy, and Re-evaluation  PLAN FOR NEXT SESSION:  Assess goals and DC    Ronell Duffus E Odaliz Mcqueary, PT, DPT  10/13/2022, 9:24 AM

## 2022-10-14 ENCOUNTER — Other Ambulatory Visit: Payer: Self-pay | Admitting: Nurse Practitioner

## 2022-10-18 ENCOUNTER — Other Ambulatory Visit: Payer: Self-pay | Admitting: Nurse Practitioner

## 2022-10-18 DIAGNOSIS — M810 Age-related osteoporosis without current pathological fracture: Secondary | ICD-10-CM

## 2022-10-19 ENCOUNTER — Ambulatory Visit: Payer: Medicare Other | Admitting: Occupational Therapy

## 2022-10-19 ENCOUNTER — Encounter: Payer: Self-pay | Admitting: Occupational Therapy

## 2022-10-19 DIAGNOSIS — R293 Abnormal posture: Secondary | ICD-10-CM | POA: Diagnosis not present

## 2022-10-19 DIAGNOSIS — M6281 Muscle weakness (generalized): Secondary | ICD-10-CM | POA: Diagnosis not present

## 2022-10-19 DIAGNOSIS — R2689 Other abnormalities of gait and mobility: Secondary | ICD-10-CM | POA: Diagnosis not present

## 2022-10-19 DIAGNOSIS — R29818 Other symptoms and signs involving the nervous system: Secondary | ICD-10-CM

## 2022-10-19 DIAGNOSIS — R278 Other lack of coordination: Secondary | ICD-10-CM | POA: Diagnosis not present

## 2022-10-19 DIAGNOSIS — M25612 Stiffness of left shoulder, not elsewhere classified: Secondary | ICD-10-CM | POA: Diagnosis not present

## 2022-10-19 DIAGNOSIS — R2681 Unsteadiness on feet: Secondary | ICD-10-CM | POA: Diagnosis not present

## 2022-10-19 NOTE — Therapy (Signed)
, OUTPATIENT OCCUPATIONAL THERAPY PARKINSON'S TREATMENT  Patient Name: Denise [name] MRN: [id number] DOB:1950/08/25, 72 y.o., female Today's Date: 10/19/2022  PCP: [name], FNP REFERRING PROVIDER: Dr. Chales Abrahams Thenganatt  END OF SESSION:  OT End of Session - 10/19/22 1408     Visit Number 11    Number of Visits 17    Date for OT Re-Evaluation 11/01/22    Authorization Type BCBS MCR 2024,  No Deductible,  [place]; [place]  Auth Not reqd    Authorization - Number of Visits 11    Progress Note Due on Visit 20    OT Start Time 1404    OT Stop Time 1449    OT Time Calculation (min) 45 min    Activity Tolerance Patient tolerated treatment well    Behavior During Therapy WFL for tasks assessed/performed             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   Past Surgical History:  Procedure Laterality Date   TUBAL LIGATION     Patient Active Problem List   Diagnosis Date Noted   Bradycardia 01/17/2022   Parkinson's disease 02/17/2020   Gait abnormality 12/04/2019   Prediabetes 12/03/2018   Vitamin D deficiency 12/03/2018   Anemia due to vitamin B12 deficiency 12/03/2018   Fatigue 12/03/2018   Vitamin B12 deficiency 10/01/2018   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   Abnormal auditory perception of both ears 05/10/2016   Osteopenia 08/23/2012    ONSET DATE: 07/02/23  REFERRING DIAG: Parkinson's Disease  THERAPY DIAG:  Unsteadiness on feet  Other lack of coordination  Abnormal posture  Other symptoms and signs involving the nervous system  Rationale for Evaluation and Treatment: Rehabilitation  SUBJECTIVE:   SUBJECTIVE STATEMENT: No pain and no recent falls  Pt accompanied by:  daughter  PERTINENT HISTORY: Parkinson's Disease, last seen by OT 12/2021 (recommended  6 month re-evaluation at OT d/c)  PMH:  Osteopenia, glaucoma, prediabetes, Vitamin D deficiency, anemia due to B12 deficiency, bradycardia    PRECAUTIONS: Fall  WEIGHT BEARING RESTRICTIONS: No  PAIN:  Are you having pain? No  FALLS: Has patient fallen in last 6 months? Yes. Number of falls 2  at granddaughter's dance practice  LIVING ENVIRONMENT: Lives with: lives with their family and lives with their daughter Lives in: House/apartment Stairs: No Has following equipment at home: Grab bars  PLOF: Independent, no longer driving  PATIENT GOALS:improve balance, coordination   OBJECTIVE:   HAND DOMINANCE: Right  ADLs: Overall ADLs: difficulty sequencing at times, uses checklist prior to leaving house Transfers/ambulation related to ADLs: Eating: difficulty cutting food with fork/knife, some difficulty opening bottles/packages Grooming: mod I UB Dressing: difficulty getting shirts over head, difficulty putting earrings in at times LB Dressing: slide in shoes most of the time (can tie) Toileting: some difficulty with sitting down on toilet, some urgency, forgetting to flush Bathing: Washes at the sink in standing   Tub Shower transfers: washes at the sink   IADLs: Light housekeeping: assists daughter  Meal Prep: assists daughter with tasks Community mobility: no longer drives Medication management: dtr supervises/assists Handwriting:  illegible at time per pt  MOBILITY STATUS: Independent and Freezing  POSTURE COMMENTS:  rounded shoulders, forward head, and weight  shift left  FUNCTIONAL OUTCOME MEASURES: Physical performance test: PPT#2 (simulated eating) Not tested & PPT#4 (donning/doffing jacket): 24sec  COORDINATION: 9 Hole Peg test: Right: 30.53 sec; Left: 47 sec Box and Blocks:  Right 43blocks, Left 42blocks bradykinesia noted LUE  UE ROM:  BUEs grossly  WFL (approx 140* shoulder flex with elbow ext bilaterally)  UE MMT:   Not tested  SENSATION: Not  tested  MUSCLE TONE: RUE: Within functional limits and LUE: Within functional limits  COGNITION: Overall cognitive status: Impaired  Pt with impaired memory, decr awareness of postural changes, bradyphrenia, and difficulty sequencing at times per dtr  OBSERVATIONS: Bradykinesia, flat affect, decr eye contact and head turns   TODAY'S TREATMENT:                                                                                                                               Standing with back against counter for balance - pt tossing scarf and catching with other hand, alternating continuously. Pt required mod cueing to toss high and catch big (opening hand fully, and grabbing big in middle of scarf).  Standing to face counter - performing LE wt shifts, trunk rotation for contralateral reaching to grab scarves 1 at a time and throw to ipsilateral side while naming things same color as scarves and/or same first letter of color for cognitive component. Pt did to each side w/ difficulty dual tasking and having to stop physical task at times. Standing at counter to hit targets while performing PWR! Rock ipsilateral and contralateral reaching while counting aloud using loud voice  UBE x 8 mintues, level 3 for UE strength/endurance maintaining > 30 RPM    PATIENT EDUCATION: See above  HOME EXERCISE PROGRAM:  1/245/24: Shoulder flex, PWR! Hands, coordination HEP  08/23/22- PWR1 basic 4 in seated 09/06/22: future PD related complications, handwriting strategies 09/12/22: ADL strategies, strategies for donning/doffing jacket, bag HEP 09/21/22; PD ex chart, community resources 09/26/22: keeping thinking skills sharp   GOALS: Potential Goals reviewed with patient? Yes  SHORT TERM GOALS: Target date: 08/25/22  Pt will be independent with updated HEP. Goal status: MET  2.  Pt will report incr ease with donning pull-over shirt without assist consistently. Goal status: MET  3.  Pt will verbalize  understanding of ways to prevent future PD related complications and appropriate community resources. Goal status: MET  4.  Pt will be able to write at least 2 sentences with good legibility and size. Goal status: MET 07/28/22   LONG TERM GOALS: Target date: 09/23/22  Pt/caregiver will verbalize understanding of adaptive strategies/AE to incr safety and ease with ADLs/IADLs. Goal status: MET  2.  Pt will don/doff jacket in less than 20sec for incr ease using strategies prn. Baseline:  24sec Goal status: MET (15 sec)   3.  Pt will improve L hand coordination for ADLs/IADLs as shown by decr time on 9-hole peg test by at least 8sec.  Baseline:  47sec Goal status: in progress  4.  Pt will improve functional reaching/coordination as shown by improving score on box and blocks test by at least 4 bilaterally. Baseline: R-43, L-42 Goal status: IN PROGRESS  5.  Pt/dtr will verbalize understanding of cognitive compensation strategies for ADLs/IADLs. Goal status: MET  ASSESSMENT:  CLINICAL IMPRESSION:  Pt has met all STG's and 3 LTG's at this time. Pt progressing towards remaining goals. Pt has demo improvements in ADLS and coordination and improves w/ repetition and cueing for large amplitude movements  PERFORMANCE DEFICITS: in functional skills including ADLs, IADLs, coordination, dexterity, Fine motor control, mobility, balance, decreased knowledge of use of DME, and UE functional use, cognitive skills including memory, safety awareness, and sequencing, and psychosocial skills including environmental adaptation and routines and behaviors.   IMPAIRMENTS: are limiting patient from ADLs, IADLs, and leisure.   COMORBIDITIES:  may have co-morbidities  that affects occupational performance. Patient will benefit from skilled OT to address above impairments and improve overall function.  MODIFICATION OR ASSISTANCE TO COMPLETE EVALUATION: Min-Moderate modification of tasks or assist with assess  necessary to complete an evaluation.  OT OCCUPATIONAL PROFILE AND HISTORY: Detailed assessment: Review of records and additional review of physical, cognitive, psychosocial history related to current functional performance.  CLINICAL DECISION MAKING: Moderate - several treatment options, min-mod task modification necessary  REHAB POTENTIAL: Good  EVALUATION COMPLEXITY: Moderate   PLAN:  OT FREQUENCY: 2x/wk   OT DURATION: for additional 5 weeks (up to 11/01/22, but may finish earlier)   PLANNED INTERVENTIONS: self care/ADL training, therapeutic exercise, therapeutic activity, neuromuscular re-education, manual therapy, passive range of motion, balance training, functional mobility training, splinting, paraffin, fluidotherapy, moist heat, cryotherapy, patient/family education, cognitive remediation/compensation, and DME and/or AE instructions  RECOMMENDED OTHER SERVICES: current with PT, ST  CONSULTED AND AGREED WITH PLAN OF CARE: Patient and family Adult nurse (dtr)  PLAN FOR NEXT SESSION:  continue coordination, progress towards remaining goals, anticipate d/c 11/01/22   Sheran Lawless, OTR/L 10/19/2022, 2:57 PM

## 2022-10-26 ENCOUNTER — Ambulatory Visit: Payer: Medicare Other | Admitting: Occupational Therapy

## 2022-10-26 ENCOUNTER — Ambulatory Visit: Payer: Medicare Other | Admitting: Physical Therapy

## 2022-10-26 DIAGNOSIS — R2681 Unsteadiness on feet: Secondary | ICD-10-CM

## 2022-10-26 DIAGNOSIS — R293 Abnormal posture: Secondary | ICD-10-CM

## 2022-10-26 DIAGNOSIS — R2689 Other abnormalities of gait and mobility: Secondary | ICD-10-CM

## 2022-10-26 DIAGNOSIS — R278 Other lack of coordination: Secondary | ICD-10-CM

## 2022-10-26 DIAGNOSIS — M6281 Muscle weakness (generalized): Secondary | ICD-10-CM | POA: Diagnosis not present

## 2022-10-26 DIAGNOSIS — M25612 Stiffness of left shoulder, not elsewhere classified: Secondary | ICD-10-CM | POA: Diagnosis not present

## 2022-10-26 DIAGNOSIS — R29818 Other symptoms and signs involving the nervous system: Secondary | ICD-10-CM | POA: Diagnosis not present

## 2022-10-26 NOTE — Therapy (Signed)
OUTPATIENT PHYSICAL THERAPY NEURO TREATMENT- DISCHARGE SUMMARY   Patient Name: Denise [name] MRN: [id number] DOB:1951-02-03, 72 y.o., female Today's Date: 10/26/2022   PCP: [name], FNP  REFERRING PROVIDER: Evern Bio Chales Abrahams, MD    PHYSICAL THERAPY DISCHARGE SUMMARY  Visits from Start of Care: 18  Current functional level related to goals / functional outcomes: Mod I-SBA w/mobility with and without use of rollator    Remaining deficits: Gait and balance deficits 2/2 PD, L lateral truncal lean    Education / Equipment: HEP   Patient agrees to discharge. Patient goals were met. Patient is being discharged due to meeting the stated rehab goals.    END OF SESSION:  PT End of Session - 10/26/22 1244     Visit Number 18    Number of Visits 19   Recert   Date for PT Re-Evaluation 11/02/22   Recert   Authorization Type BCBS    PT Start Time 1244   Pt arrived late and used restroom prior to session   PT Stop Time 1313    PT Time Calculation (min) 29 min    Equipment Utilized During Treatment Gait belt    Activity Tolerance Patient tolerated treatment well    Behavior During Therapy WFL for tasks assessed/performed                        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   Past Surgical History:  Procedure Laterality Date   TUBAL LIGATION     Patient Active Problem List   Diagnosis Date Noted   Bradycardia 01/17/2022   Parkinson's disease 02/17/2020   Gait abnormality 12/04/2019   Prediabetes 12/03/2018   Vitamin D deficiency 12/03/2018   Anemia due to vitamin B12 deficiency 12/03/2018   Fatigue 12/03/2018   Vitamin B12 deficiency 10/01/2018   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 States she is having some freezing when trying to get into bed. No falls.    Pt accompanied by: family member daughter   PERTINENT HISTORY: PD, glaucoma, osteoporosis (newly diagnosed), TB (as a child)    PAIN:  Are you having pain? No  PRECAUTIONS: Fall and Other: Osteoporosis  WEIGHT BEARING RESTRICTIONS: No  FALLS: Has patient fallen in last 6 months? Yes. Number of falls 2  LIVING ENVIRONMENT: Lives with: lives with their family - daughter and granddaughter  Lives in: House/apartment Stairs: Yes: External: 2 steps; none Has following equipment at home: Single point cane and Grab bars  PLOF: Independent, pt's daughter reports sometimes pt needs help with dressing   PATIENT GOALS: See how she was since the last time she was here. Work on the balance   OBJECTIVE:  TODAY'S TREATMENT:     Ther Act  LTG Assessment   [facility] PT Assessment - 10/26/22 1301       Timed Up and Go Test   Normal TUG (seconds) 11.13   no AD           Discussed freezing episodes when getting into bed and pt able to demonstrate sit <>supine on L side without issue. Pt reports the freezing mostly happens in the morning when she wakes up to use bathroom and then wants to get back in bed. Educated pt on likely being in an "off" period and strategies to work on bed mobility (rocking, seated marches). Pt's daughter reports pt's bed is very high (mattress with two box  springs), so encouraged pt and daughter to remove one box spring to improve safety and ease of getting into/out of bed. Also encouraged pt practice several sit <>stands and sit <>supine w/lower bed height prior to sleeping on new height to avoid slipping out of bed or having difficulty standing from lower height. Pt and daughter verbalized understanding.    PATIENT EDUCATION: Education details: continue HEP and walking program, plan for screens in 23mo, how to obtain new referral if balance/mobility declines prior to screen date  Person educated: Patient and daughter  Education method: Explanation, Demonstration, and Verbal cues Education comprehension: verbalized understanding, returned demonstration, and needs further education  HOME EXERCISE PROGRAM: From previous bout of therapy: Will review/updated as needed.   Standing and seated PWR, Pt has in her exercise folder.     Access Code: [email or web address] URL: [email or web address] Date: 10/19/2021 Prepared by: [name]   Reviewed/condensed exercises for balance:    Exercises - Alternating Step Backward with Support  - 1 x daily - 5 x weekly - 1-2 sets - 10 reps - Romberg Stance Eyes Closed on Foam Pad  - 1 x daily - 5 x weekly - 3 sets - 30 hold   Plus standing tall against the wall for posture - 2 x 30 second holds    GOALS: Goals reviewed with patient? Yes  SHORT TERM GOALS: ALL STGS = LTGS    LONG TERM GOALS: Target date: 10/26/2022  UPDATED/ON-GOING LTGS FOR RE-CERT:    Pt will be independent with final PD and walking program HEP for improved strength, balance, transfers, and gait  Baseline:  Goal status: MET  2.  Pt will verbalize and demonstrate understanding of fall prevention in the home/community (use of appropriate AD, backing up into chairs, freezing strategies) Baseline:  Goal status: MET  3.  Pt will improve TUG time with no AD to 13.5 seconds or  less in order to demo decrease fall  risk Baseline: 15.25 seconds; 13.15s on 4/10; 11.13s (4/24) Goal status: MET   ASSESSMENT:  CLINICAL IMPRESSION: Emphasis of skilled PT session on LTG assessment and DC from PT. Pt has met 3 of 3 LTGs, improving her TUG time to 11s without AD w/o any incidence of freezing or imbalance. Pt is performing her HEP daily and reports her deadlift exercise helps tremendously for reducing freezing episodes with stand > sit. Pt able to teach back fall prevention strategies in both the home and community and is overall moving well. Did educate pt and daughter on watching pt's L lateral truncal lean and working on finding an internal cue that works for pt to self-correct this. Pt in agreement to DC this date due to meeting her goals and being happy with current functional level.    OBJECTIVE IMPAIRMENTS: Abnormal gait, decreased activity tolerance, decreased balance, decreased cognition, decreased coordination, decreased knowledge of use of DME, decreased mobility, difficulty walking, decreased strength, decreased safety awareness, impaired flexibility, impaired tone, and postural dysfunction.   ACTIVITY LIMITATIONS: carrying, lifting, bending, stairs, transfers, dressing, and locomotion level  PARTICIPATION LIMITATIONS: meal prep, cleaning, driving, shopping, and community activity  PERSONAL FACTORS: Age, Behavior pattern, Past/current experiences, Time since onset of injury/illness/exacerbation, and 3+ comorbidities: PD, glaucoma, osteoporosis (newly diagnosed), TB (as a child)    are also affecting patient's functional outcome.   REHAB POTENTIAL: Good  CLINICAL DECISION MAKING: Evolving/moderate complexity  EVALUATION COMPLEXITY: Moderate  PLAN:  PT FREQUENCY: 1-2x/week  PT DURATION: 4 weeks  PLANNED INTERVENTIONS: Therapeutic exercises, Therapeutic activity, Neuromuscular re-education, Balance training, Gait training, Patient/Family education, Self Care, Stair training, Vestibular training,  Visual/preceptual remediation/compensation, DME instructions, Manual therapy, and Re-evaluation    Drucilla Cumber E Leeum Sankey, PT, DPT  10/26/2022, 1:15 PM

## 2022-10-26 NOTE — Therapy (Signed)
, OUTPATIENT OCCUPATIONAL THERAPY PARKINSON'S TREATMENT  Patient Name: Denise [name] MRN: [id number] DOB:[birth year], 72 y.o., female Today's Date: 10/26/2022  PCP: [name], FNP REFERRING PROVIDER: Dr. Chales Abrahams Thenganatt  END OF SESSION:  OT End of Session - 10/26/22 1319     Visit Number 12    Number of Visits 17    Date for OT Re-Evaluation 11/01/22    Authorization Type [employer] MCR 2024,  No Deductible,  [place]; [place]  Auth Not reqd    Authorization - Number of Visits 12    Progress Note Due on Visit 20    OT Start Time 1318    OT Stop Time 1400    OT Time Calculation (min) 42 min    Activity Tolerance Patient tolerated treatment well    Behavior During Therapy WFL for tasks assessed/performed             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   Past Surgical History:  Procedure Laterality Date   TUBAL LIGATION     Patient Active Problem List   Diagnosis Date Noted   Bradycardia 01/17/2022   Parkinson's disease 02/17/2020   Gait abnormality 12/04/2019   Prediabetes 12/03/2018   Vitamin D deficiency 12/03/2018   Anemia due to vitamin B12 deficiency 12/03/2018   Fatigue 12/03/2018   Vitamin B12 deficiency 10/01/2018   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   Abnormal auditory perception of both ears 05/10/2016   Osteopenia 08/23/2012    ONSET DATE: 07/02/23  REFERRING DIAG: Parkinson's Disease  THERAPY DIAG:  Unsteadiness on feet  Other lack of coordination  Abnormal posture  Rationale for Evaluation and Treatment: Rehabilitation  SUBJECTIVE:   SUBJECTIVE STATEMENT: No pain and no recent falls  Pt accompanied by:  daughter  PERTINENT HISTORY: Parkinson's Disease, last seen by OT 12/2021 (recommended 6 month re-evaluation at OT d/c)  PMH:  Osteopenia,  glaucoma, prediabetes, Vitamin D deficiency, anemia due to B12 deficiency, bradycardia    PRECAUTIONS: Fall  WEIGHT BEARING RESTRICTIONS: No  PAIN:  Are you having pain? No  FALLS: Has patient fallen in last 6 months? Yes. Number of falls 2  at granddaughter's dance practice  LIVING ENVIRONMENT: Lives with: lives with their family and lives with their daughter Lives in: House/apartment Stairs: No Has following equipment at home: Grab bars  PLOF: Independent, no longer driving  PATIENT GOALS:improve balance, coordination   OBJECTIVE:   HAND DOMINANCE: Right  ADLs: Overall ADLs: difficulty sequencing at times, uses checklist prior to leaving house Transfers/ambulation related to ADLs: Eating: difficulty cutting food with fork/knife, some difficulty opening bottles/packages Grooming: mod I UB Dressing: difficulty getting shirts over head, difficulty putting earrings in at times LB Dressing: slide in shoes most of the time (can tie) Toileting: some difficulty with sitting down on toilet, some urgency, forgetting to flush Bathing: Washes at the sink in standing   Tub Shower transfers: washes at the sink   IADLs: Light housekeeping: assists daughter  Meal Prep: assists daughter with tasks Community mobility: no longer drives Medication management: dtr supervises/assists Handwriting:  illegible at time per pt  MOBILITY STATUS: Independent and Freezing  POSTURE COMMENTS:  rounded shoulders, forward head, and weight shift left  FUNCTIONAL OUTCOME MEASURES: Physical performance test:  PPT#2 (simulated eating) Not tested & PPT#4 (donning/doffing jacket): 24sec  COORDINATION: 9 Hole Peg test: Right: 30.53 sec; Left: 47 sec Box and Blocks:  Right 43blocks, Left 42blocks bradykinesia noted LUE  UE ROM:  BUEs grossly  WFL (approx 140* shoulder flex with elbow ext bilaterally)  UE MMT:   Not tested  SENSATION: Not tested  MUSCLE TONE: RUE: Within functional limits and  LUE: Within functional limits  COGNITION: Overall cognitive status: Impaired  Pt with impaired memory, decr awareness of postural changes, bradyphrenia, and difficulty sequencing at times per dtr  OBSERVATIONS: Bradykinesia, flat affect, decr eye contact and head turns   TODAY'S TREATMENT:                                                                                                                               Reviewed opening hands BIG prior to grabbing/grasping items, clothing, etc.  Stressed importance of full MP extension. Reviewed techniques for opening twist lids/jars.  Practiced donning/doffing coat x 2 w/ cues to reach big around behind back to doff.  Reviewed strategies for putting groceries/dishes away for big movements but w/ modifications prn for balance/fall prevention  Practiced flipping cards over, pushing off top card w/ thumb in one big push, and flicking cards across table bilaterally for coordination and finger extension.     PATIENT EDUCATION: See above  HOME EXERCISE PROGRAM:  1/245/24: Shoulder flex, PWR! Hands, coordination HEP  08/23/22- PWR1 basic 4 in seated 09/06/22: future PD related complications, handwriting strategies 09/12/22: ADL strategies, strategies for donning/doffing jacket, bag HEP 09/21/22; PD ex chart, community resources 09/26/22: keeping thinking skills sharp   GOALS: Potential Goals reviewed with patient? Yes  SHORT TERM GOALS: Target date: 08/25/22  Pt will be independent with updated HEP. Goal status: MET  2.  Pt will report incr ease with donning pull-over shirt without assist consistently. Goal status: MET  3.  Pt will verbalize understanding of ways to prevent future PD related complications and appropriate community resources. Goal status: MET  4.  Pt will be able to write at least 2 sentences with good legibility and size. Goal status: MET 07/28/22   LONG TERM GOALS: Target date: 09/23/22  Pt/caregiver will verbalize  understanding of adaptive strategies/AE to incr safety and ease with ADLs/IADLs. Goal status: MET  2.  Pt will don/doff jacket in less than 20sec for incr ease using strategies prn. Baseline:  24sec Goal status: MET (15 sec)   3.  Pt will improve L hand coordination for ADLs/IADLs as shown by decr time on 9-hole peg test by at least 8sec.  Baseline:  47sec Goal status: in progress  4.  Pt will improve functional reaching/coordination as shown by improving score on box and blocks test by at least 4 bilaterally. Baseline: R-43, L-42 Goal status: IN PROGRESS  5.  Pt/dtr will verbalize understanding of cognitive compensation strategies for ADLs/IADLs. Goal status: MET  ASSESSMENT:  CLINICAL IMPRESSION:  Pt has met all STG's and 3 LTG's at this time. Pt progressing towards remaining goals. Pt has demo improvements in ADLS and coordination and improves w/ repetition and cueing for large amplitude movements  PERFORMANCE DEFICITS: in functional skills including ADLs, IADLs, coordination, dexterity, Fine motor control, mobility, balance, decreased knowledge of use of DME, and UE functional use, cognitive skills including memory, safety awareness, and sequencing, and psychosocial skills including environmental adaptation and routines and behaviors.   IMPAIRMENTS: are limiting patient from ADLs, IADLs, and leisure.   COMORBIDITIES:  may have co-morbidities  that affects occupational performance. Patient will benefit from skilled OT to address above impairments and improve overall function.  MODIFICATION OR ASSISTANCE TO COMPLETE EVALUATION: Min-Moderate modification of tasks or assist with assess necessary to complete an evaluation.  OT OCCUPATIONAL PROFILE AND HISTORY: Detailed assessment: Review of records and additional review of physical, cognitive, psychosocial history related to current functional performance.  CLINICAL DECISION MAKING: Moderate - several treatment options, min-mod task  modification necessary  REHAB POTENTIAL: Good  EVALUATION COMPLEXITY: Moderate   PLAN:  OT FREQUENCY: 2x/wk   OT DURATION: for additional 5 weeks (up to 11/01/22, but may finish earlier)   PLANNED INTERVENTIONS: self care/ADL training, therapeutic exercise, therapeutic activity, neuromuscular re-education, manual therapy, passive range of motion, balance training, functional mobility training, splinting, paraffin, fluidotherapy, moist heat, cryotherapy, patient/family education, cognitive remediation/compensation, and DME and/or AE instructions  RECOMMENDED OTHER SERVICES: current with PT, ST  CONSULTED AND AGREED WITH PLAN OF CARE: Patient and family Adult nurse (dtr)  PLAN FOR NEXT SESSION:  assess remaining goals and d/c next session   Sheran Lawless, OTR/L 10/26/2022, 1:20 PM

## 2022-11-01 ENCOUNTER — Ambulatory Visit: Payer: Medicare Other | Admitting: Occupational Therapy

## 2022-11-09 ENCOUNTER — Ambulatory Visit (INDEPENDENT_AMBULATORY_CARE_PROVIDER_SITE_OTHER): Payer: Medicare Other | Admitting: Family Medicine

## 2022-11-09 ENCOUNTER — Encounter: Payer: Self-pay | Admitting: Family Medicine

## 2022-11-09 ENCOUNTER — Ambulatory Visit (INDEPENDENT_AMBULATORY_CARE_PROVIDER_SITE_OTHER): Payer: Medicare Other

## 2022-11-09 VITALS — BP 130/70 | HR 68 | Temp 98.4°F | Ht 63.0 in | Wt 128.1 lb

## 2022-11-09 VITALS — BP 130/70 | HR 68 | Temp 98.4°F | Ht 63.0 in | Wt 128.0 lb

## 2022-11-09 DIAGNOSIS — R7303 Prediabetes: Secondary | ICD-10-CM | POA: Diagnosis not present

## 2022-11-09 DIAGNOSIS — R269 Unspecified abnormalities of gait and mobility: Secondary | ICD-10-CM | POA: Diagnosis not present

## 2022-11-09 DIAGNOSIS — W19XXXA Unspecified fall, initial encounter: Secondary | ICD-10-CM | POA: Diagnosis not present

## 2022-11-09 DIAGNOSIS — Z0000 Encounter for general adult medical examination without abnormal findings: Secondary | ICD-10-CM | POA: Diagnosis not present

## 2022-11-09 DIAGNOSIS — R5383 Other fatigue: Secondary | ICD-10-CM | POA: Diagnosis not present

## 2022-11-09 HISTORY — DX: Unspecified fall, initial encounter: W19.XXXA

## 2022-11-09 NOTE — Assessment & Plan Note (Addendum)
Patient accompanied by daughter who stated that mom fell twice in 2 weeks; she was advised to check patient's BP daily for at least 1 week before administering HCTZ. Advised to Hold HCTZ for SBP < 100 and check  BP lying, sitting and standing.

## 2022-11-09 NOTE — Assessment & Plan Note (Signed)
Patient c/o fatigue and lightheadedness just before episode of fall. Will check CMP, CBC and other labs for possible deficiency

## 2022-11-09 NOTE — Progress Notes (Signed)
I,[name],acting as a Neurosurgeon for [employer], NP.,have documented all relevant documentation on the behalf of [name], NP,as directed by  [name], NP while in the presence of [name], NP.    Subjective:     Patient ID: Denise [name] , female    DOB: [birth year] , 72 y.o.   MRN: [id number]   Chief Complaint  Patient presents with   Fall    HPI  Patient is a 72 year old female who was accompanied by daughter today to the office because daughter states that in the past 2 weeks she has fallen twice. Patient usually had an episode of  fatigue,lightheadedness and sweats before she fell. 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     Family History  Problem Relation Age of Onset   Hypertension Mother    Heart disease Mother    Diabetes Father    Hypertension Father    Other Father        had a condition that was "a cousin" to ALS    Diabetes Sister    Tuberculosis Maternal Grandmother    Heart disease Sister    Colitis Daughter    Colon cancer Neg Hx    Colon polyps Neg Hx    Esophageal cancer Neg Hx    Rectal cancer Neg Hx    Stomach cancer Neg Hx      Current Outpatient Medications:    alendronate (FOSAMAX) 70 MG tablet, TAKE 1 TABLET (70 MG TOTAL) BY MOUTH EVERY 7 DAYS WITH FULL GLASS WATER ON EMPTY STOMACH, Disp: 12 tablet, Rfl: 1   atorvastatin (LIPITOR) 10 MG tablet, TAKE 1 TABLET BY MOUTH EVERY DAY AT BEDTIME MONDAY THROUGH FRIDAY, Disp: 65 tablet, Rfl: 4   Calcium Carb-Cholecalciferol (OYSTER SHELL CALCIUM/VITAMIN D) 500-200 MG-UNIT PACK, Take by mouth., Disp: , Rfl:    carbidopa-levodopa (SINEMET IR) 25-100 MG tablet, Take at  6am,12 and 6pm. Try to avoid protein. May take an extra during the day if needed., Disp: 360 tablet, Rfl: 4   dorzolamide-timolol (COSOPT) 2-0.5 % ophthalmic solution, Place 1 drop into the left eye 2 (two) times daily., Disp: , Rfl:    hydrochlorothiazide (HYDRODIURIL) 12.5 MG tablet, TAKE 1 TABLET BY MOUTH EVERY DAY AS NEEDED FOR LEG SWELLING AS DIRECTED, Disp: 90 tablet, Rfl: 1   ibuprofen (ADVIL) 800 MG tablet, Take 800 mg by mouth every 8 (eight) hours as needed., Disp: , Rfl:    Travoprost, BAK Free, (TRAVATAN) 0.004 % SOLN ophthalmic solution, 1 drop at bedtime., Disp: , Rfl:    Vitamin D, Ergocalciferol, (DRISDOL) 1.25 MG (50000 UNIT) CAPS capsule, TAKE 1 CAPSULE (50,000 UNITS TOTAL) BY MOUTH EVERY 7 (SEVEN) DAYS, Disp: 12 capsule, Rfl: 0   Allergies  Allergen Reactions   Nystatin-Triamcinolone Swelling    BLISTERS   Triamcinolone Acetonide     Blisters    Cephalexin Itching and Rash     Review of Systems  Constitutional:  Positive for fatigue.  Respiratory: Negative.    Cardiovascular: Negative.   Musculoskeletal:  Positive for gait problem.  Skin: Negative.  Psychiatric/Behavioral: Negative.       Today's Vitals   11/09/22 1541  BP: 130/70  Pulse: 68  Temp: 98.4 F (36.9 C)  Weight: 128 lb 1.4 oz (58.1 kg)  Height: 5\' 3"  (1.6 m)  PainSc: 0-No pain   Body mass index is 22.69 kg/m.   Objective:  Physical Exam Cardiovascular:     Rate and Rhythm: Normal rate and regular rhythm.  Pulmonary:     Effort: Pulmonary effort is normal.     Breath sounds: Normal breath sounds.  Abdominal:     General: Bowel sounds are normal.  Skin:    General: Skin is warm and dry.  Neurological:     Mental Status: She is alert.     Gait: Gait abnormal.         Assessment And Plan:     1. Fall, initial encounter  2. Fatigue, unspecified type - CBC no Diff - CMP14+EGFR - Hemoglobin A1c - Lipid panel - Vitamin D (25 hydroxy)  3. Prediabetes - Hemoglobin A1c      Patient was given opportunity to ask questions. Patient verbalized understanding of the plan and was able to repeat key elements of the plan. All questions were answered to their satisfaction.  [name], NP   I, [name], NP, have reviewed all documentation for this visit. The documentation on 11/09/22 for the exam, diagnosis, procedures, and orders are all accurate and complete.   IF YOU HAVE BEEN REFERRED TO A SPECIALIST, IT MAY TAKE 1-2 WEEKS TO SCHEDULE/PROCESS THE REFERRAL. IF YOU HAVE NOT HEARD FROM US/SPECIALIST IN TWO WEEKS, PLEASE GIVE [place] A CALL AT [phone].   THE PATIENT IS ENCOURAGED TO PRACTICE SOCIAL DISTANCING DUE TO THE COVID-19 PANDEMIC.

## 2022-11-09 NOTE — Progress Notes (Signed)
Subjective:   Denise [name] is a 72 y.o. female who presents for Medicare Annual (Subsequent) preventive examination.  Review of Systems     Cardiac Risk Factors include: advanced age (>16men, >88 women)     Objective:    Today's Vitals   11/09/22 1424  BP: 130/70  Pulse: 68  Temp: 98.4 F (36.9 C)  TempSrc: Oral  Weight: 128 lb (58.1 kg)  Height: 5\' 3"  (1.6 m)   Body mass index is 22.67 kg/m.     11/09/2022    2:41 PM 11/02/2021    4:38 PM 10/12/2021    1:45 PM 02/08/2021   10:22 AM 01/28/2021    8:05 AM 03/13/2020   12:45 PM 02/28/2020    8:05 AM  Advanced Directives  Does Patient Have a Medical Advance Directive? Yes Yes Yes Yes Yes Yes Yes  Type of Estate agent of [place];Living will Healthcare Power of [place];Living will Healthcare Power of [place];Living will Healthcare Power of [place];Living will  Healthcare Power of [place];Living will Healthcare Power of [place];Living will  Does patient want to make changes to medical advance directive?       No - Patient declined  Copy of Healthcare Power of Attorney in Chart? No - copy requested No - copy requested         Current Medications (verified) Outpatient Encounter Medications as of 11/09/2022  Medication Sig   alendronate (FOSAMAX) 70 MG tablet TAKE 1 TABLET (70 MG TOTAL) BY MOUTH EVERY 7 DAYS WITH FULL GLASS WATER ON EMPTY STOMACH   atorvastatin (LIPITOR) 10 MG tablet TAKE 1 TABLET BY MOUTH EVERY DAY AT BEDTIME MONDAY THROUGH FRIDAY   Calcium Carb-Cholecalciferol (OYSTER SHELL CALCIUM/VITAMIN D) 500-200 MG-UNIT PACK Take by mouth.   carbidopa-levodopa (SINEMET IR) 25-100 MG tablet Take at 6am,12 and 6pm. Try to avoid protein. May take an extra during the day if needed.   dorzolamide-timolol (COSOPT) 2-0.5 % ophthalmic solution Place 1 drop into the left eye 2 (two) times daily.   hydrochlorothiazide (HYDRODIURIL) 12.5 MG tablet TAKE 1 TABLET BY MOUTH EVERY DAY AS NEEDED FOR LEG SWELLING AS  DIRECTED   ibuprofen (ADVIL) 800 MG tablet Take 800 mg by mouth every 8 (eight) hours as needed.   Travoprost, BAK Free, (TRAVATAN) 0.004 % SOLN ophthalmic solution 1 drop at bedtime.   Vitamin D, Ergocalciferol, (DRISDOL) 1.25 MG (50000 UNIT) CAPS capsule TAKE 1 CAPSULE (50,000 UNITS TOTAL) BY MOUTH EVERY 7 (SEVEN) DAYS   No facility-administered encounter medications on file as of 11/09/2022.    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   Past Surgical History:  Procedure Laterality Date   TUBAL LIGATION     Family History  Problem Relation Age of Onset   Hypertension Mother    Heart disease Mother    Diabetes Father    Hypertension Father    Other Father        had a condition that was "a cousin" to ALS    Diabetes Sister    Tuberculosis Maternal Grandmother    Heart disease Sister    Colitis Daughter    Colon cancer Neg Hx    Colon polyps Neg Hx    Esophageal cancer  Neg Hx    Rectal cancer Neg Hx    Stomach cancer Neg Hx    Social History   Socioeconomic History   Marital status: Single    Spouse name: Not on file   Number of children: 3   Years of education: Not on file   Highest education level: Master's degree (e.g., MA, MS, MEng, MEd, MSW, MBA)  Occupational History   Not on file  Tobacco Use   Smoking status: Never   Smokeless tobacco: Never  Vaping Use   Vaping Use: Never used  Substance and Sexual Activity   Alcohol use: Never   Drug use: No   Sexual activity: Not Currently    Birth control/protection: Post-menopausal  Other Topics Concern   Not on file  Social History Narrative   Lives with daughter and grandaughter    Right handed   Caffeine: none   Social Determinants of Health   Financial  Resource Strain: Low Risk  (11/09/2022)   Overall Financial Resource Strain (CARDIA)    Difficulty of Paying Living Expenses: Not hard at all  Food Insecurity: No Food Insecurity (11/09/2022)   Hunger Vital Sign    Worried About Running Out of Food in the Last Year: Never true    Ran Out of Food in the Last Year: Never true  Transportation Needs: No Transportation Needs (11/09/2022)   PRAPARE - Administrator, Civil Service (Medical): No    Lack of Transportation (Non-Medical): No  Physical Activity: Insufficiently Active (11/09/2022)   Exercise Vital Sign    Days of Exercise per Week: 4 days    Minutes of Exercise per Session: 30 min  Stress: No Stress Concern Present (11/09/2022)   [employer] of Occupational Health - Occupational Stress Questionnaire    Feeling of Stress : Not at all  Social Connections: Moderately Integrated (11/02/2021)   Social Connection and Isolation Panel [NHANES]    Frequency of Communication with Friends and Family: More than three times a week    Frequency of Social Gatherings with Friends and Family: Twice a week    Attends Religious Services: More than 4 times per year    Active Member of [employer] or Organizations: Yes    Attends Banker Meetings: More than 4 times per year    Marital Status: Widowed    Tobacco Counseling Counseling given: Not Answered   Clinical Intake:  Pre-visit preparation completed: Yes  Pain : No/denies pain     Nutritional Status: BMI of 19-24  Normal Nutritional Risks: Nausea/ vomitting/ diarrhea (nausea and Monday and last week) Diabetes: No  How often do you need to have someone help you when you read instructions, pamphlets, or other written materials from your doctor or pharmacy?: 1 - Never  Diabetic? no  Interpreter Needed?: No  Information entered by :: NAllen LPN   Activities of Daily Living    11/09/2022    2:43 PM  In your present state of health, do you have any difficulty  performing the following activities:  Hearing? 1  Comment sometimes  Vision? 0  Difficulty concentrating or making decisions? 0  Walking or climbing stairs? 1  Dressing or bathing? 0  Comment has someone present in the home  Doing errands, shopping? 0  Preparing Food and eating ? N  Using the Toilet? N  In the past six months, have you accidently leaked urine? Y  Comment urinary incontinence  Do you have problems with loss of bowel control? Jeannie Fend  Managing your Medications? Y  Managing your Finances? N  Housekeeping or managing your Housekeeping? N    Patient Care Team: [name], FNP as PCP - General (General Practice)  Indicate any recent Medical Services you may have received from other than [facility] providers in the past year (date may be approximate).     Assessment:   This is a routine wellness examination for [place].  Hearing/Vision screen Vision Screening - Comments:: Regular eye exams, [facility]  Dietary issues and exercise activities discussed: Current Exercise Habits: Home exercise routine, Type of exercise: Other - see comments (works with PT and OT), Time (Minutes): 30, Frequency (Times/Week): 4, Weekly Exercise (Minutes/Week): 120   Goals Addressed             This Visit's Progress    Patient Stated       11/09/2022, working on maintaining Parkinson and remain active       Depression Screen    11/09/2022    2:43 PM 08/10/2022   11:21 AM 04/05/2022    4:07 PM 03/15/2022   12:12 PM 11/02/2021    3:57 PM 03/19/2021    1:28 PM 08/04/2020   10:06 AM  PHQ 2/9 Scores  PHQ - 2 Score 0 0 0 0 0 0 0    Fall Risk    11/09/2022    2:42 PM 08/10/2022   11:21 AM 04/05/2022    4:07 PM 03/15/2022   12:12 PM 11/02/2021    4:02 PM  Fall Risk   Falls in the past year? 1 1 1  0 0  Comment not sure exactly thinks the Parkinsons      Number falls in past yr: 1 1 0 0 0  Injury with Fall? 0  0 0 0  Comment bruises      Risk for fall due to : History of fall(s);Impaired  balance/gait;Impaired mobility;Medication side effect  History of fall(s) No Fall Risks No Fall Risks  Follow up Falls prevention discussed;Education provided;Falls evaluation completed  Falls evaluation completed Falls evaluation completed Falls evaluation completed    FALL RISK PREVENTION PERTAINING TO THE HOME:  Any stairs in or around the home? Yes  If so, are there any without handrails? Yes  Home free of loose throw rugs in walkways, pet beds, electrical cords, etc? Yes  Adequate lighting in your home to reduce risk of falls? Yes   ASSISTIVE DEVICES UTILIZED TO PREVENT FALLS:  Life alert? No  Use of a cane, walker or w/c? Yes  Grab bars in the bathroom? Yes  Shower chair or bench in shower? No  Elevated toilet seat or a handicapped toilet? Yes   TIMED UP AND GO:  Was the test performed? Yes .  Length of time to ambulate 10 feet: 7 sec.   Gait slow and steady with assistive device  Cognitive Function:        11/09/2022    2:45 PM 11/02/2021    4:01 PM 11/02/2021    4:00 PM 03/19/2021    1:40 PM 03/19/2021    1:24 PM  6CIT Screen  What Year? 0 points 0 points 0 points 0 points 0 points  What month? 0 points 0 points 0 points 0 points 0 points  What time? 0 points 3 points 0 points 0 points 0 points  Count back from 20 0 points 0 points 2 points 0 points 0 points  Months in reverse 0 points 0 points 0 points 0 points 0 points  Repeat  phrase 0 points 0 points 0 points 0 points 0 points  Total Score 0 points 3 points 2 points 0 points 0 points    Immunizations Immunization History  Administered Date(s) Administered   Fluad Quad(high Dose 65+) 05/13/2019, 04/07/2020, 03/15/2022   Influenza, High Dose Seasonal PF 05/21/2021   Influenza, Seasonal, Injecte, Preservative Fre 03/17/2015   Influenza,inj,Quad PF,6+ Mos 03/17/2015, 05/07/2018   Influenza-Unspecified 07/18/2003, 03/17/2015, 05/21/2021   Moderna Covid-19 Vaccine Bivalent Booster 58yrs & up 09/01/2021   Moderna  Sars-Covid-2 Vaccination 08/31/2019, 09/28/2019   PFIZER Comirnaty(Gray Top)Covid-19 Tri-Sucrose Vaccine 02/15/2021   PFIZER(Purple Top)SARS-COV-2 Vaccination 05/23/2020, 02/15/2021, 04/08/2021   PNEUMOCOCCAL CONJUGATE-20 04/08/2021   Td 07/18/2003   Tdap 05/01/2017   Zoster Recombinat (Shingrix) 05/08/2021, 08/09/2021    TDAP status: Up to date  Flu Vaccine status: Up to date  Pneumococcal vaccine status: Up to date  Covid-19 vaccine status: Completed vaccines  Qualifies for Shingles Vaccine? Yes   Zostavax completed Yes   Shingrix Completed?: Yes  Screening Tests Health Maintenance  Topic Date Due   COVID-19 Vaccine (7 - 2023-24 season) 03/04/2022   MAMMOGRAM  06/21/2022   Medicare Annual Wellness (AWV)  11/03/2022   INFLUENZA VACCINE  02/02/2023   COLONOSCOPY (Pts 45-48yrs Insurance coverage will need to be confirmed)  07/09/2025   DTaP/Tdap/Td (3 - Td or Tdap) 05/02/2027   Pneumonia Vaccine 51+ Years old  Completed   DEXA SCAN  Completed   Hepatitis C Screening  Completed   Zoster Vaccines- Shingrix  Completed   HPV VACCINES  Aged Out    Health Maintenance  Health Maintenance Due  Topic Date Due   COVID-19 Vaccine (7 - 2023-24 season) 03/04/2022   MAMMOGRAM  06/21/2022   Medicare Annual Wellness (AWV)  11/03/2022    Colorectal cancer screening: Type of screening: Colonoscopy. Completed 07/10/2015. Repeat every 10 years  Mammogram status: Completed 2023. Repeat every year  Bone Density status: Completed 07/21/2021.   Lung Cancer Screening: (Low Dose CT Chest recommended if Age 36-80 years, 30 pack-year currently smoking OR have quit w/in 15years.) does not qualify.   Lung Cancer Screening Referral: no  Additional Screening:  Hepatitis C Screening: does qualify; Completed 08/23/2012  Vision Screening: Recommended annual ophthalmology exams for early detection of glaucoma and other disorders of the eye. Is the patient up to date with their annual eye exam?   Yes  Who is the provider or what is the name of the office in which the patient attends annual eye exams? [facility] If pt is not established with a provider, would they like to be referred to a provider to establish care? No .   Dental Screening: Recommended annual dental exams for proper oral hygiene  Community Resource Referral / Chronic Care Management: CRR required this visit?  No   CCM required this visit?  No      Plan:     I have personally reviewed and noted the following in the patient's chart:   Medical and social history Use of alcohol, tobacco or illicit drugs  Current medications and supplements including opioid prescriptions. Patient is not currently taking opioid prescriptions. Functional ability and status Nutritional status Physical activity Advanced directives List of other physicians Hospitalizations, surgeries, and ER visits in previous 12 months Vitals Screenings to include cognitive, depression, and falls Referrals and appointments  In addition, I have reviewed and discussed with patient certain preventive protocols, quality metrics, and best practice recommendations. A written personalized care plan for preventive services as  well as general preventive health recommendations were provided to patient.     Barb Merino, LPN   07/09/1094   Nurse Notes: has been having a lot of falls. Would like to know if just related to Parkinsons or something else going on. Added to NP schedule for today.

## 2022-11-09 NOTE — Assessment & Plan Note (Signed)
Last HgbA1c 03/2022

## 2022-11-09 NOTE — Patient Instructions (Signed)
Denise [name] , Thank you for taking time to come for your Medicare Wellness Visit. I appreciate your ongoing commitment to your health goals. Please review the following plan we discussed and let me know if I can assist you in the future.   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  Completed   Zoster (Shingles) Vaccine  Completed   HPV Vaccine  Aged Out    Advanced directives: Please bring a copy of your POA (Power of [place]) and/or Living Will to your next appointment.   Conditions/risks identified: none  Next appointment: Follow up in one year for your annual wellness visit    Preventive Care 65 Years and Older, Female Preventive care refers to lifestyle choices and visits with your health care provider that can promote health and wellness. What does preventive care include? A yearly physical exam. This is also called an annual well check. Dental exams once or twice a year. Routine eye exams. Ask your health care provider how often you should have your eyes checked. Personal lifestyle choices, including: Daily care of your teeth and gums. Regular physical activity. Eating a healthy diet. Avoiding tobacco and drug use. Limiting alcohol use. Practicing safe sex. Taking low-dose aspirin every  day. Taking vitamin and mineral supplements as recommended by your health care provider. What happens during an annual well check? The services and screenings done by your health care provider during your annual well check will depend on your age, overall health, lifestyle risk factors, and family history of disease. Counseling  Your health care provider may ask you questions about your: Alcohol use. Tobacco use. Drug use. Emotional well-being. Home and relationship well-being. Sexual activity. Eating habits. History of falls. Memory and ability to understand (cognition). Work and work Astronomer. Reproductive health. Screening  You may have the following tests or measurements: Height, weight, and BMI. Blood pressure. Lipid and cholesterol levels. These may be checked every 5 years, or more frequently if you are over 77 years old. Skin check. Lung cancer screening. You may have this screening every year starting at age 14 if you have a 30-pack-year history of smoking and currently smoke or have quit within the past 15 years. Fecal occult blood test (FOBT) of the stool. You may have this test every year starting at age 66. Flexible sigmoidoscopy or colonoscopy. You may have a sigmoidoscopy every 5 years or a colonoscopy every 10 years starting at age 31. Hepatitis C blood test. Hepatitis B blood test. Sexually transmitted disease (STD) testing. Diabetes screening. This is done by checking your blood sugar (glucose) after you have not eaten for a while (fasting). You may have this  done every 1-3 years. Bone density scan. This is done to screen for osteoporosis. You may have this done starting at age 28. Mammogram. This may be done every 1-2 years. Talk to your health care provider about how often you should have regular mammograms. Talk with your health care provider about your test results, treatment options, and if necessary, the need for more tests. Vaccines  Your health care  provider may recommend certain vaccines, such as: Influenza vaccine. This is recommended every year. Tetanus, diphtheria, and acellular pertussis (Tdap, Td) vaccine. You may need a Td booster every 10 years. Zoster vaccine. You may need this after age 43. Pneumococcal 13-valent conjugate (PCV13) vaccine. One dose is recommended after age 75. Pneumococcal polysaccharide (PPSV23) vaccine. One dose is recommended after age 66. Talk to your health care provider about which screenings and vaccines you need and how often you need them. This information is not intended to replace advice given to you by your health care provider. Make sure you discuss any questions you have with your health care provider. Document Released: 07/17/2015 Document Revised: 03/09/2016 Document Reviewed: 04/21/2015 Elsevier Interactive Patient Education  2017 [place] Prevention in the Home Falls can cause injuries. They can happen to people of all ages. There are many things you can do to make your home safe and to help prevent falls. What can I do on the outside of my home? Regularly fix the edges of walkways and driveways and fix any cracks. Remove anything that might make you trip as you walk through a door, such as a raised step or threshold. Trim any bushes or trees on the path to your home. Use bright outdoor lighting. Clear any walking paths of anything that might make someone trip, such as rocks or tools. Regularly check to see if handrails are loose or broken. Make sure that both sides of any steps have handrails. Any raised decks and porches should have guardrails on the edges. Have any leaves, snow, or ice cleared regularly. Use sand or salt on walking paths during winter. Clean up any spills in your garage right away. This includes oil or grease spills. What can I do in the bathroom? Use night lights. Install grab bars by the toilet and in the tub and shower. Do not use towel bars as grab  bars. Use non-skid mats or decals in the tub or shower. If you need to sit down in the shower, use a plastic, non-slip stool. Keep the floor dry. Clean up any water that spills on the floor as soon as it happens. Remove soap buildup in the tub or shower regularly. Attach bath mats securely with double-sided non-slip rug tape. Do not have throw rugs and other things on the floor that can make you trip. What can I do in the bedroom? Use night lights. Make sure that you have a light by your bed that is easy to reach. Do not use any sheets or blankets that are too big for your bed. They should not hang down onto the floor. Have a firm chair that has side arms. You can use this for support while you get dressed. Do not have throw rugs and other things on the floor that can make you trip. What can I do in the kitchen? Clean up any spills right away. Avoid walking on wet floors. Keep items that you use a lot in easy-to-reach places. If you need to reach something above you, use a strong step stool that  has a grab bar. Keep electrical cords out of the way. Do not use floor polish or wax that makes floors slippery. If you must use wax, use non-skid floor wax. Do not have throw rugs and other things on the floor that can make you trip. What can I do with my stairs? Do not leave any items on the stairs. Make sure that there are handrails on both sides of the stairs and use them. Fix handrails that are broken or loose. Make sure that handrails are as long as the stairways. Check any carpeting to make sure that it is firmly attached to the stairs. Fix any carpet that is loose or worn. Avoid having throw rugs at the top or bottom of the stairs. If you do have throw rugs, attach them to the floor with carpet tape. Make sure that you have a light switch at the top of the stairs and the bottom of the stairs. If you do not have them, ask someone to add them for you. What else can I do to help prevent  falls? Wear shoes that: Do not have high heels. Have rubber bottoms. Are comfortable and fit you well. Are closed at the toe. Do not wear sandals. If you use a stepladder: Make sure that it is fully opened. Do not climb a closed stepladder. Make sure that both sides of the stepladder are locked into place. Ask someone to hold it for you, if possible. Clearly mark and make sure that you can see: Any grab bars or handrails. First and last steps. Where the edge of each step is. Use tools that help you move around (mobility aids) if they are needed. These include: Canes. Walkers. Scooters. Crutches. Turn on the lights when you go into a dark area. Replace any light bulbs as soon as they burn out. Set up your furniture so you have a clear path. Avoid moving your furniture around. If any of your floors are uneven, fix them. If there are any pets around you, be aware of where they are. Review your medicines with your doctor. Some medicines can make you feel dizzy. This can increase your chance of falling. Ask your doctor what other things that you can do to help prevent falls. This information is not intended to replace advice given to you by your health care provider. Make sure you discuss any questions you have with your health care provider. Document Released: 04/16/2009 Document Revised: 11/26/2015 Document Reviewed: 07/25/2014 Elsevier Interactive Patient Education  2017 [employer].

## 2022-11-10 LAB — CMP14+EGFR
ALT: 5 IU/L (ref 0–32)
AST: 17 IU/L (ref 0–40)
Albumin/Globulin Ratio: 2 (ref 1.2–2.2)
Albumin: 4.2 g/dL (ref 3.9–4.9)
Alkaline Phosphatase: 41 IU/L — ABNORMAL LOW (ref 44–121)
BUN/Creatinine Ratio: 18 (ref 12–28)
BUN: 18 mg/dL (ref 8–27)
Bilirubin Total: 0.7 mg/dL (ref 0.0–1.2)
CO2: 26 mmol/L (ref 20–29)
Calcium: 9.4 mg/dL (ref 8.7–10.3)
Chloride: 105 mmol/L (ref 96–106)
Creatinine, Ser: 0.99 mg/dL (ref 0.57–1.00)
Globulin, Total: 2.1 g/dL (ref 1.5–4.5)
Glucose: 88 mg/dL (ref 70–99)
Potassium: 3.6 mmol/L (ref 3.5–5.2)
Sodium: 142 mmol/L (ref 134–144)
Total Protein: 6.3 g/dL (ref 6.0–8.5)
eGFR: 61 mL/min/{1.73_m2} (ref >59)

## 2022-11-10 LAB — LIPID PANEL
Chol/HDL Ratio: 2.2 ratio (ref 0.0–4.4)
Cholesterol, Total: 122 mg/dL (ref 100–199)
HDL: 55 mg/dL (ref >39)
LDL Chol Calc (NIH): 53 mg/dL (ref 0–99)
Triglycerides: 68 mg/dL (ref 0–149)
VLDL Cholesterol Cal: 14 mg/dL (ref 5–40)

## 2022-11-10 LAB — CBC
Hematocrit: 36.3 % (ref 34.0–46.6)
Hemoglobin: 11.7 g/dL (ref 11.1–15.9)
MCH: 28.1 pg (ref 26.6–33.0)
MCHC: 32.2 g/dL (ref 31.5–35.7)
MCV: 87 fL (ref 79–97)
Platelets: 220 10*3/uL (ref 150–450)
RBC: 4.16 x10E6/uL (ref 3.77–5.28)
RDW: 12.8 % (ref 11.7–15.4)
WBC: 4.3 10*3/uL (ref 3.4–10.8)

## 2022-11-10 LAB — VITAMIN D 25 HYDROXY (VIT D DEFICIENCY, FRACTURES): Vit D, 25-Hydroxy: 84.4 ng/mL (ref 30.0–100.0)

## 2022-11-10 LAB — HEMOGLOBIN A1C
Est. average glucose Bld gHb Est-mCnc: 123 mg/dL
Hgb A1c MFr Bld: 5.9 % — ABNORMAL HIGH (ref 4.8–5.6)

## 2022-11-16 NOTE — Assessment & Plan Note (Signed)
Patient is currently getting physical therapy sessions to help with her gait and walking.

## 2022-11-17 DIAGNOSIS — K080 Exfoliation of teeth due to systemic causes: Secondary | ICD-10-CM | POA: Diagnosis not present

## 2022-12-12 ENCOUNTER — Ambulatory Visit: Payer: Medicare Other | Admitting: Nurse Practitioner

## 2022-12-20 DIAGNOSIS — B351 Tinea unguium: Secondary | ICD-10-CM | POA: Diagnosis not present

## 2022-12-20 DIAGNOSIS — L84 Corns and callosities: Secondary | ICD-10-CM | POA: Diagnosis not present

## 2022-12-20 DIAGNOSIS — I70203 Unspecified atherosclerosis of native arteries of extremities, bilateral legs: Secondary | ICD-10-CM | POA: Diagnosis not present

## 2022-12-20 DIAGNOSIS — M79676 Pain in unspecified toe(s): Secondary | ICD-10-CM | POA: Diagnosis not present

## 2022-12-21 ENCOUNTER — Ambulatory Visit (INDEPENDENT_AMBULATORY_CARE_PROVIDER_SITE_OTHER): Payer: Medicare Other | Admitting: Nurse Practitioner

## 2022-12-21 VITALS — BP 124/80 | HR 48 | Temp 98.6°F | Ht 63.0 in | Wt 126.4 lb

## 2022-12-21 DIAGNOSIS — W19XXXD Unspecified fall, subsequent encounter: Secondary | ICD-10-CM

## 2022-12-21 DIAGNOSIS — R269 Unspecified abnormalities of gait and mobility: Secondary | ICD-10-CM

## 2022-12-21 DIAGNOSIS — Z23 Encounter for immunization: Secondary | ICD-10-CM

## 2022-12-21 DIAGNOSIS — G20A1 Parkinson's disease without dyskinesia, without mention of fluctuations: Secondary | ICD-10-CM

## 2022-12-21 NOTE — Progress Notes (Signed)
Madelaine Bhat, CMA,acting as a Neurosurgeon for [name], FNP.,have documented all relevant documentation on the behalf of [name], FNP,as directed by  [name], FNP while in the presence of [name], FNP.  Subjective:  Patient ID: Denise [name] , female    DOB: [birth year] , 72 y.o.   MRN: [id number]  Chief Complaint  Patient presents with   Diabetes    HPI  Patient presents today after being seen in May for a fall. She was to take hydrochlorothiazide every other day and here to be reevaluated. She is taking her hydrochlorothiazide as needed. She was taking every day. She has finished this round of PT and will have another screening October 2024. Next appt with Neurology is October.    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     Family History  Problem Relation Age of Onset   Hypertension Mother    Heart disease Mother    Diabetes Father    Hypertension Father    Other Father        had a condition that was "a cousin" to ALS    Diabetes Sister    Tuberculosis Maternal Grandmother    Heart disease Sister    Colitis Daughter    Colon cancer Neg Hx    Colon polyps Neg Hx    Esophageal cancer Neg Hx    Rectal cancer Neg Hx    Stomach cancer Neg Hx      Current Outpatient Medications:    alendronate (FOSAMAX) 70 MG tablet, TAKE 1 TABLET (70 MG TOTAL) BY MOUTH EVERY 7 DAYS WITH FULL GLASS WATER ON EMPTY STOMACH, Disp: 12 tablet, Rfl: 1   atorvastatin (LIPITOR) 10 MG tablet, TAKE 1 TABLET BY MOUTH EVERY DAY AT BEDTIME MONDAY THROUGH FRIDAY, Disp: 65 tablet, Rfl: 4   Calcium Carb-Cholecalciferol (OYSTER SHELL CALCIUM/VITAMIN D) 500-200 MG-UNIT PACK, Take by mouth., Disp: , Rfl:    carbidopa-levodopa (SINEMET IR)  25-100 MG tablet, Take at 6am,12 and 6pm. Try to avoid protein. May take an extra during the day if needed., Disp: 360 tablet, Rfl: 4   dorzolamide-timolol (COSOPT) 2-0.5 % ophthalmic solution, Place 1 drop into the left eye 2 (two) times daily., Disp: , Rfl:    hydrochlorothiazide (HYDRODIURIL) 12.5 MG tablet, TAKE 1 TABLET BY MOUTH EVERY DAY AS NEEDED FOR LEG SWELLING AS DIRECTED, Disp: 90 tablet, Rfl: 1   ibuprofen (ADVIL) 800 MG tablet, Take 800 mg by mouth every 8 (eight) hours as needed., Disp: , Rfl:    Travoprost, BAK Free, (TRAVATAN) 0.004 % SOLN ophthalmic solution, 1 drop at bedtime., Disp: , Rfl:    Vitamin D, Ergocalciferol, (DRISDOL) 1.25 MG (50000 UNIT) CAPS capsule, TAKE 1 CAPSULE (50,000 UNITS TOTAL) BY MOUTH EVERY 7 (SEVEN) DAYS, Disp: 12 capsule, Rfl: 0   Allergies  Allergen Reactions   Nystatin-Triamcinolone Swelling    BLISTERS   Triamcinolone Acetonide     Blisters    Cephalexin Itching and Rash     Review of Systems  Constitutional:  Negative for fatigue.  Respiratory: Negative.    Cardiovascular: Negative.  Negative for chest pain, palpitations and leg swelling.  Gastrointestinal: Negative.   Neurological: Negative.   Psychiatric/Behavioral: Negative.       Today's Vitals   12/21/22 1211  BP: 124/80  Pulse: (!) 48  Temp: 98.6 F (37 C)  TempSrc: Oral  Weight: 126 lb 6.4 oz (57.3 kg)  Height: 5\' 3"  (1.6 m)  PainSc: 0-No pain   Body mass index is 22.39 kg/m.  Wt Readings from Last 3 Encounters:  12/21/22 126 lb 6.4 oz (57.3 kg)  11/09/22 128 lb 1.4 oz (58.1 kg)  11/09/22 128 lb (58.1 kg)    The ASCVD Risk score (Arnett DK, et al., 2019) failed to calculate for the following reasons:   The valid total cholesterol range is 130 to 320 mg/dL  Objective:  Physical Exam Vitals reviewed.  Constitutional:      General: She is not in acute distress.    Appearance: Normal appearance.  Cardiovascular:     Rate and Rhythm: Normal rate and regular rhythm.      Pulses: Normal pulses.     Heart sounds: Normal heart sounds. No murmur heard. Pulmonary:     Effort: Pulmonary effort is normal. No respiratory distress.     Breath sounds: Normal breath sounds. No wheezing.  Musculoskeletal:        General: No swelling or tenderness.  Skin:    General: Skin is warm and dry.     Capillary Refill: Capillary refill takes less than 2 seconds.  Neurological:     General: No focal deficit present.     Mental Status: She is alert and oriented to person, place, and time.     Cranial Nerves: No cranial nerve deficit.     Motor: No weakness.  Psychiatric:        Mood and Affect: Mood normal.        Behavior: Behavior normal.        Thought Content: Thought content normal.        Judgment: Judgment normal.         Assessment And Plan:    Parkinson's disease without dyskinesia, unspecified whether manifestations fluctuate Assessment & Plan: Continue follow-up with neurology   Gait abnormality Assessment & Plan: Continue using rollator walker for ambulation and has completed physical therapy for this session.   Fall, subsequent encounter Assessment & Plan: Not had any further falls since her last visit.   Need for COVID-19 vaccine Assessment & Plan: Covid 19 vaccine given in office observed for 15 minutes without any adverse reaction   Orders: [employer] Fall 2023 Covid-19 Vaccine 38yrs and older    Return for controlled DM check 4 months.  Patient was given opportunity to ask questions. Patient verbalized understanding of the plan and was able to repeat key elements of the plan. All questions were answered to their satisfaction.  [name], FNP  I, [name], FNP, have reviewed all documentation for this visit. The documentation on 12/21/22 for the exam, diagnosis, procedures, and orders are all accurate and complete.   IF YOU HAVE BEEN REFERRED TO A SPECIALIST, IT MAY TAKE 1-2 WEEKS TO SCHEDULE/PROCESS THE REFERRAL. IF YOU  HAVE NOT HEARD FROM US/SPECIALIST IN TWO WEEKS, PLEASE GIVE [place] A CALL AT [phone] X [phone].

## 2022-12-30 DIAGNOSIS — Z23 Encounter for immunization: Secondary | ICD-10-CM | POA: Insufficient documentation

## 2022-12-30 NOTE — Assessment & Plan Note (Signed)
Not had any further falls since her last visit.

## 2022-12-30 NOTE — Assessment & Plan Note (Signed)
Continue using rollator walker for ambulation and has completed physical therapy for this session.

## 2022-12-30 NOTE — Assessment & Plan Note (Signed)
Continue follow-up with neurology 

## 2022-12-30 NOTE — Assessment & Plan Note (Signed)
Covid 19 vaccine given in office observed for 15 minutes without any adverse reaction  

## 2023-01-05 ENCOUNTER — Other Ambulatory Visit: Payer: Self-pay | Admitting: Nurse Practitioner

## 2023-03-01 DIAGNOSIS — Z20822 Contact with and (suspected) exposure to covid-19: Secondary | ICD-10-CM | POA: Diagnosis not present

## 2023-03-01 DIAGNOSIS — Z881 Allergy status to other antibiotic agents status: Secondary | ICD-10-CM | POA: Diagnosis not present

## 2023-03-01 DIAGNOSIS — R410 Disorientation, unspecified: Secondary | ICD-10-CM | POA: Diagnosis not present

## 2023-03-01 DIAGNOSIS — Z888 Allergy status to other drugs, medicaments and biological substances status: Secondary | ICD-10-CM | POA: Diagnosis not present

## 2023-03-01 DIAGNOSIS — J069 Acute upper respiratory infection, unspecified: Secondary | ICD-10-CM | POA: Diagnosis not present

## 2023-03-01 DIAGNOSIS — M1990 Unspecified osteoarthritis, unspecified site: Secondary | ICD-10-CM | POA: Diagnosis not present

## 2023-03-01 DIAGNOSIS — Z20828 Contact with and (suspected) exposure to other viral communicable diseases: Secondary | ICD-10-CM | POA: Diagnosis not present

## 2023-03-01 DIAGNOSIS — Z7983 Long term (current) use of bisphosphonates: Secondary | ICD-10-CM | POA: Diagnosis not present

## 2023-03-01 DIAGNOSIS — M810 Age-related osteoporosis without current pathological fracture: Secondary | ICD-10-CM | POA: Diagnosis not present

## 2023-03-01 DIAGNOSIS — I10 Essential (primary) hypertension: Secondary | ICD-10-CM | POA: Diagnosis not present

## 2023-03-03 ENCOUNTER — Telehealth: Payer: Self-pay

## 2023-03-03 NOTE — Transitions of Care (Post Inpatient/ED Visit) (Unsigned)
   03/03/2023  Name: Jissel Gathings MRN: [id number] DOB: [birth year]  Today's TOC FU Call Status: Today's TOC FU Call Status:: Successful TOC FU Call Completed TOC FU Call Complete Date: 03/03/23 Patient's Name and Date of Birth confirmed.  Transition Care Management Follow-up Telephone Call Date of Discharge: 03/02/23 Discharge Facility: Other [facility]) Name of Other (Non-[facility]) Discharge Facility: [place] Type of Discharge: Emergency Department Reason for ED Visit: Other: Any questions or concerns?: No  Items Reviewed: Did you receive and understand the discharge instructions provided?: Yes Medications obtained,verified, and reconciled?: Yes (Medications Reviewed) Any new allergies since your discharge?: No Dietary orders reviewed?: NA Do you have support at home?: Yes People in Home: child(ren), adult  Medications Reviewed Today: Medications Reviewed Today   Medications were not reviewed in this encounter     Home Care and Equipment/Supplies: Were Home Health Services Ordered?: NA Any new equipment or medical supplies ordered?: NA  Functional Questionnaire: Do you need assistance with bathing/showering or dressing?: No Do you need assistance with meal preparation?: No Do you need assistance with eating?: No Do you have difficulty maintaining continence: No Do you need assistance with getting out of bed/getting out of a chair/moving?: No Do you have difficulty managing or taking your medications?: No  Follow up appointments reviewed: PCP Follow-up appointment confirmed?: NA Specialist Hospital Follow-up appointment confirmed?: NA Do you need transportation to your follow-up appointment?: No Do you understand care options if your condition(s) worsen?: Yes-patient verbalized understanding    SIGNATUREYL,RMA

## 2023-03-13 ENCOUNTER — Telehealth: Payer: Self-pay | Admitting: Neurology

## 2023-03-13 ENCOUNTER — Encounter: Payer: Self-pay | Admitting: Nurse Practitioner

## 2023-03-13 ENCOUNTER — Ambulatory Visit (INDEPENDENT_AMBULATORY_CARE_PROVIDER_SITE_OTHER): Payer: Medicare Other | Admitting: Nurse Practitioner

## 2023-03-13 VITALS — BP 130/70 | HR 52 | Temp 98.5°F | Ht 63.0 in | Wt 126.0 lb

## 2023-03-13 DIAGNOSIS — Z2821 Immunization not carried out because of patient refusal: Secondary | ICD-10-CM | POA: Diagnosis not present

## 2023-03-13 DIAGNOSIS — R350 Frequency of micturition: Secondary | ICD-10-CM | POA: Diagnosis not present

## 2023-03-13 DIAGNOSIS — R443 Hallucinations, unspecified: Secondary | ICD-10-CM

## 2023-03-13 DIAGNOSIS — G20A1 Parkinson's disease without dyskinesia, without mention of fluctuations: Secondary | ICD-10-CM | POA: Diagnosis not present

## 2023-03-13 LAB — POCT URINALYSIS DIP (CLINITEK)
Bilirubin, UA: NEGATIVE
Glucose, UA: NEGATIVE mg/dL
Ketones, POC UA: NEGATIVE mg/dL
Nitrite, UA: NEGATIVE
Spec Grav, UA: 1.025 (ref 1.010–1.025)
Urobilinogen, UA: 1 U/dL
pH, UA: 5.5 (ref 5.0–8.0)

## 2023-03-13 MED ORDER — SULFAMETHOXAZOLE-TRIMETHOPRIM 800-160 MG PO TABS
1.0000 | ORAL_TABLET | Freq: Two times a day (BID) | ORAL | 0 refills | Status: DC
Start: 2023-03-13 — End: 2023-06-15

## 2023-03-13 NOTE — Assessment & Plan Note (Signed)
Urinalysis has small leuks and trace blood, due to the hallucinations will treat with antibiotic pending urine culture results.

## 2023-03-13 NOTE — Telephone Encounter (Signed)
I called the daughter. Patient has been having visual hallucinations for months but new since her last pcp visit in June. She can have them during the day or during the night. She wakes up in the mornings in a panic because she thinks there is water on the floor. She has had some intermittent confusion. We haven't seen pt in 1.5 years. Dr Lucia Gaskins did have cancellation so I have her scheduled for 9/10 at 830 am but I have advised daughter to call primary care to have patient checked for UTI today.

## 2023-03-13 NOTE — Progress Notes (Signed)
Madelaine Bhat, CMA,acting as a Neurosurgeon for [name], FNP.,have documented all relevant documentation on the behalf of [name], FNP,as directed by  [name], FNP while in the presence of [name], FNP.  Subjective:  Patient ID: Denise [name] , female    DOB: 05-19-1951 , 72 y.o.   MRN: [id number]  No chief complaint on file.   HPI  Patient presents today for a possible UTI,  patient reports hallucinations since February she was seeing dogs then it progressed to appeared wax like/marble. She would see them at the breakfast table. Not there for long periods. No auditory hallucinations. Her daughter feels like it started around the time they got a dog in February but did not mention to her daughter until last week. She says "they taunt me".  Dgt says when she wakes up in the morning thinking she urinated in the bed and sees a puddle of water on the floor. For the last week has been every morning. Dgt feels like she is forgetting how to use the bathroom. The last 2 mornings has been crying due to the floor being wet and her shoes wet but it really is not. Dgt reports she thought a women was giving her a shot to get her in the shape she is in and concerned about a "woman" taking her money.  Patient's daughter reports she didn't want to mention it before. Patient also has urinary frequency. Patient also has a lot of confusion and has been sleeping more. She and her daughter are interested in a medication to treat hallucinations. She had 2 falls in May. She is having a hard time trusting when she walks, sits down, using the bathroom. Her daughter is not having to clean her from urinating/bowel movements. Walking is slowing down and bending over more.   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     Family History  Problem Relation Age of Onset   Hypertension Mother    Heart disease Mother    Diabetes Father    Hypertension Father    Other Father        had a condition that was "a cousin" to ALS    Diabetes Sister    Tuberculosis Maternal Grandmother    Heart disease Sister    Colitis Daughter    Colon cancer Neg Hx    Colon polyps Neg Hx    Esophageal cancer Neg Hx    Rectal cancer Neg Hx    Stomach cancer Neg Hx      Current Outpatient Medications:    alendronate (FOSAMAX) 70 MG tablet, TAKE 1 TABLET (70 MG TOTAL) BY MOUTH EVERY 7 DAYS WITH FULL GLASS WATER ON EMPTY STOMACH, Disp: 12 tablet, Rfl: 1   atorvastatin (LIPITOR) 10 MG tablet, TAKE 1 TABLET BY MOUTH EVERY DAY AT BEDTIME MONDAY THROUGH FRIDAY, Disp: 65 tablet, Rfl: 4   Calcium Carb-Cholecalciferol (OYSTER SHELL CALCIUM/VITAMIN D) 500-200 MG-UNIT PACK, Take by mouth., Disp: , Rfl:    carbidopa-levodopa (SINEMET IR) 25-100 MG tablet, Take at 6am,12 and 6pm. Try to avoid protein. May take an extra during the day if needed. (Patient taking  differently: 0.5 tablets. Take at 6am,12 and 6pm. Try to avoid protein. May take an extra during the day if needed.), Disp: 360 tablet, Rfl: 4   dorzolamide-timolol (COSOPT) 2-0.5 % ophthalmic solution, Place 1 drop into the left eye 2 (two) times daily., Disp: , Rfl:    ibuprofen (ADVIL) 800 MG tablet, Take 800 mg by mouth every 8 (eight) hours as needed., Disp: , Rfl:    sulfamethoxazole-trimethoprim (BACTRIM DS) 800-160 MG tablet, Take 1 tablet by mouth 2 (two) times daily., Disp: 10 tablet, Rfl: 0   Travoprost, BAK Free, (TRAVATAN) 0.004 % SOLN ophthalmic solution, 1 drop at bedtime., Disp: , Rfl:    Vitamin D, Ergocalciferol, (DRISDOL) 1.25 MG (50000 UNIT) CAPS capsule, TAKE 1 CAPSULE (50,000 UNITS TOTAL) BY MOUTH EVERY 7 (SEVEN) DAYS, Disp: 12 capsule, Rfl: 0   hydrochlorothiazide (HYDRODIURIL) 12.5 MG tablet, TAKE 1  TABLET BY MOUTH EVERY DAY AS NEEDED FOR LEG SWELLING AS DIRECTED, Disp: 90 tablet, Rfl: 1   QUEtiapine (SEROQUEL) 25 MG tablet, Take 1 tablet (25 mg total) by mouth at bedtime., Disp: 30 tablet, Rfl: 6   Allergies  Allergen Reactions   Nystatin-Triamcinolone Swelling    BLISTERS   Triamcinolone Acetonide     Blisters    Cephalexin Itching and Rash     Review of Systems  Constitutional: Negative.   Respiratory: Negative.    Cardiovascular: Negative.   Musculoskeletal: Negative.   Psychiatric/Behavioral:  Positive for hallucinations.      Today's Vitals   03/13/23 1528  BP: 130/70  Pulse: (!) 52  Temp: 98.5 F (36.9 C)  TempSrc: Oral  Weight: 126 lb (57.2 kg)  Height: 5\' 3"  (1.6 m)  PainSc: 0-No pain   Body mass index is 22.32 kg/m.  Wt Readings from Last 3 Encounters:  03/15/23 124 lb (56.2 kg)  03/13/23 126 lb (57.2 kg)  12/21/22 126 lb 6.4 oz (57.3 kg)     Objective:  Physical Exam Vitals reviewed.  Constitutional:      General: She is not in acute distress.    Appearance: Normal appearance.  Cardiovascular:     Rate and Rhythm: Normal rate and regular rhythm.     Pulses: Normal pulses.     Heart sounds: Normal heart sounds. No murmur heard. Pulmonary:     Effort: Pulmonary effort is normal. No respiratory distress.     Breath sounds: Normal breath sounds. No wheezing.  Musculoskeletal:        General: No swelling or tenderness.     Comments: Using rolling walker  Skin:    General: Skin is warm and dry.     Capillary Refill: Capillary refill takes less than 2 seconds.  Neurological:     General: No focal deficit present.     Mental Status: She is alert and oriented to person, place, and time.     Cranial Nerves: No cranial nerve deficit.     Motor: No weakness.  Psychiatric:        Mood and Affect: Mood normal.        Behavior: Behavior normal.        Thought Content: Thought content normal.        Judgment: Judgment normal.         Assessment  And Plan:  Parkinson's disease without dyskinesia, unspecified whether manifestations fluctuate Assessment & Plan: Continue follow-up with neurology  Orders: -     POCT URINALYSIS DIP (CLINITEK) -     Urine Culture  Urinary frequency  Assessment & Plan: Urinalysis has small leuks and trace blood, due to the hallucinations will treat with antibiotic pending urine culture results.   Orders: -     Sulfamethoxazole-Trimethoprim; Take 1 tablet by mouth 2 (two) times daily.  Dispense: 10 tablet; Refill: 0  Hallucinations Assessment & Plan: Her hallucinations may be related to her Parkinson's disease.  Urinalysis is normal negative for nitrates.  Small leuks.  Will send urine culture.  She has an appointment with neurology in the morning  Orders: -     POCT URINALYSIS DIP (CLINITEK) -     Urine Culture  Influenza vaccination declined Assessment & Plan: Patient declined influenza vaccination at this time. Patient is aware that influenza vaccine prevents illness in 70% of healthy people, and reduces hospitalizations to 30-70% in elderly. This vaccine is recommended annually. Education has been provided regarding the importance of this vaccine but patient still declined. Advised may receive this vaccine at local pharmacy or Health Dept.or vaccine clinic. Aware to provide a copy of the vaccination record if obtained from local pharmacy or Health Dept.  Pt is willing to accept risk associated with refusing vaccination.      No follow-ups on file.   Patient was given opportunity to ask questions. Patient verbalized understanding of the plan and was able to repeat key elements of the plan. All questions were answered to their satisfaction.    Jeanell Sparrow, FNP, have reviewed all documentation for this visit. The documentation on 03/13/23 for the exam, diagnosis, procedures, and orders are all accurate and complete.   IF YOU HAVE BEEN REFERRED TO A SPECIALIST, IT MAY TAKE 1-2 WEEKS TO  SCHEDULE/PROCESS THE REFERRAL. IF YOU HAVE NOT HEARD FROM US/SPECIALIST IN TWO WEEKS, PLEASE GIVE [place] A CALL AT [phone] X [phone].

## 2023-03-13 NOTE — Telephone Encounter (Signed)
Pt's daughter called stating that the pt has started having hallucinations frequently and she would like to be advised on what can be done.

## 2023-03-14 ENCOUNTER — Ambulatory Visit: Payer: Medicare Other | Admitting: Neurology

## 2023-03-15 ENCOUNTER — Encounter: Payer: Self-pay | Admitting: Neurology

## 2023-03-15 ENCOUNTER — Ambulatory Visit: Payer: Medicare Other | Admitting: Neurology

## 2023-03-15 VITALS — BP 142/72 | HR 55 | Ht 66.0 in | Wt 124.0 lb

## 2023-03-15 DIAGNOSIS — Z736 Limitation of activities due to disability: Secondary | ICD-10-CM

## 2023-03-15 DIAGNOSIS — R2689 Other abnormalities of gait and mobility: Secondary | ICD-10-CM

## 2023-03-15 DIAGNOSIS — R269 Unspecified abnormalities of gait and mobility: Secondary | ICD-10-CM

## 2023-03-15 DIAGNOSIS — R278 Other lack of coordination: Secondary | ICD-10-CM | POA: Diagnosis not present

## 2023-03-15 DIAGNOSIS — G20A2 Parkinson's disease without dyskinesia, with fluctuations: Secondary | ICD-10-CM | POA: Diagnosis not present

## 2023-03-15 DIAGNOSIS — R443 Hallucinations, unspecified: Secondary | ICD-10-CM

## 2023-03-15 MED ORDER — QUETIAPINE FUMARATE 25 MG PO TABS: 25.0000 mg | ORAL_TABLET | Freq: Every day | ORAL | 6 refills | Status: DC

## 2023-03-15 NOTE — Patient Instructions (Addendum)
Start seroquel/quetiapine   Quetiapine Tablets What is this medication? QUETIAPINE (kwe TYE a peen) treats hallucintaions. It works by balancing the levels of dopamine and serotonin in your brain, hormones that help regulate mood, behaviors, and thoughts. It belongs to a group of medications called antipsychotics. Antipsychotic medications can be used to treat several kinds of conditions. This medicine may be used for other purposes; ask your health care provider or pharmacist if you have questions. COMMON BRAND NAME(S): Seroquel What should I tell my care team before I take this medication? They need to know if you have any of these conditions: Blockage in your bowels Cataracts Constipation Dementia Diabetes Difficulty swallowing Glaucoma Heart disease High levels of prolactin History of breast cancer History of irregular heartbeat Liver disease Low blood cell levels (white cells, red cells, and platelets) Low blood pressure Parkinson disease Prostate disease Seizures Suicidal thoughts, plans, or attempt by you or a family member Thyroid disease Trouble passing urine An unusual or allergic reaction to quetiapine, other medications, foods, dyes, or preservatives Pregnant or trying to get pregnant Breastfeeding How should I use this medication? Take this medication by mouth with water. Take it as directed on the prescription label at the same time every day. You can take it with or without food. If it upsets your stomach, take it with food. Keep taking it unless your care team tells you to stop. A special MedGuide will be given to you by the pharmacist with each prescription and refill. Be sure to read this information carefully each time. Talk to your care team about the use of this medication in children. While this medication may be prescribed for children as young as 10 years for selected conditions, precautions do apply. People over 71 years of age may have a stronger  reaction to this medication and need smaller doses. Overdosage: If you think you have taken too much of this medicine contact a poison control center or emergency room at once. NOTE: This medicine is only for you. Do not share this medicine with others. What if I miss a dose? If you miss a dose, take it as soon as you can. If it is almost time for your next dose, take only that dose. Do not take double or extra doses. What may interact with this medication? Do not take this medication with any of the following: Cisapride Dronedarone Metoclopramide Pimozide Thioridazine This medication may also interact with the following: Alcohol Antihistamines for allergy, cough, and cold Atropine Avasimibe Certain antivirals for HIV or hepatitis Certain medications for anxiety or sleep Certain medications for bladder problems, such as oxybutynin, tolterodine Certain medications for depression, such as amitriptyline, fluoxetine, nefazodone, sertraline Certain medications for fungal infections, such as fluconazole, ketoconazole, itraconazole, posaconazole Certain medications for stomach problems, such as dicyclomine, hyoscyamine Certain medications for travel sickness, such as scopolamine Cimetidine General anesthetics, such as halothane, isoflurane, methoxyflurane, propofol Ipratropium Levodopa or other medications for Parkinson disease Medications for blood pressure Medications for seizures Medications that relax muscles for surgery Opioid medications for pain Other medications that cause heart rhythm changes Phenothiazines, such as chlorpromazine, prochlorperazine Rifampin St. John's wort This list may not describe all possible interactions. Give your health care provider a list of all the medicines, herbs, non-prescription drugs, or dietary supplements you use. Also tell them if you smoke, drink alcohol, or use illegal drugs. Some items may interact with your medicine. What should I watch for  while using this medication? Visit your care team for regular checks  on your progress. Tell your care team if your symptoms do not start to get better or if they get worse. Do not suddenly stop taking This medication. You may develop a severe reaction. Your care team will tell you how much medication to take. If your care team wants you to stop the medication, the dose may be slowly lowered over time to avoid any side effects. You may need to have an eye exam before and during use of this medication. This medication may increase blood sugar. Ask your care team if changes in diet or medications are needed if you have diabetes. This medication may cause thoughts of suicide or depression. This includes sudden changes in mood, behaviors, or thoughts. These changes can happen at any time but are more common in the beginning of treatment or after a change in dose. Call your care team right away if you experience these thoughts or worsening depression. This medication may affect your coordination, reaction time, or judgment. Do not drive or operate machinery until you know how this medication affects you. Sit up or stand slowly to reduce the risk of dizzy or fainting spells. Drinking alcohol with this medication can increase the risk of these side effects. This medication can cause problems with controlling your body temperature. It can lower the response of your body to cold temperatures. If possible, stay indoors during cold weather. If you must go outdoors, wear warm clothes. It can also lower the response of your body to heat. Do not overheat. Do not over-exercise. Stay out of the sun when possible. If you must be in the sun, wear cool clothing. Drink plenty of water. If you have trouble controlling your body temperature, call your care team right away. What side effects may I notice from receiving this medication? Side effects that you should report to your care team as soon as possible: Allergic  reactions--skin rash, itching, hives, swelling of the face, lips, tongue, or throat Heart rhythm changes--fast or irregular heartbeat, dizziness, feeling faint or lightheaded, chest pain, trouble breathing High blood sugar (hyperglycemia)--increased thirst or amount of urine, unusual weakness or fatigue, blurry vision High fever, stiff muscles, increased sweating, fast or irregular heartbeat, and confusion, which may be signs of neuroleptic malignant syndrome High prolactin level--unexpected breast tissue growth, discharge from the nipple, change in sex drive or performance, irregular menstrual cycle Increase in blood pressure in children Infection--fever, chills, cough, or sore throat Low blood pressure--dizziness, feeling faint or lightheaded, blurry vision Low thyroid levels (hypothyroidism)--unusual weakness or fatigue, increased sensitivity to cold, constipation, hair loss, dry skin, weight gain, feelings of depression Pain or trouble swallowing Seizures Stroke--sudden numbness or weakness of the face, arm, or leg, trouble speaking, confusion, trouble walking, loss of balance or coordination, dizziness, severe headache, change in vision Sudden eye pain or change in vision such as blurry vision, seeing halos around lights, vision loss Thoughts of suicide or self-harm, worsening mood, feelings of depression Trouble passing urine Uncontrolled and repetitive body movements, muscle stiffness or spasms, tremors or shaking, loss of balance or coordination, restlessness, shuffling walk, which may be signs of extrapyramidal symptoms (EPS) Side effects that usually do not require medical attention (report to your care team if they continue or are bothersome): Constipation Dizziness Drowsiness Dry mouth Weight gain This list may not describe all possible side effects. Call your doctor for medical advice about side effects. You may report side effects to FDA at 1-800-FDA-1088. Where should I keep my  medication? Keep out of  the reach of children. Store at room temperature between 15 and 30 degrees C (59 and 86 degrees F). Throw away any unused medication after the expiration date. NOTE: This sheet is a summary. It may not cover all possible information. If you have questions about this medicine, talk to your doctor, pharmacist, or health care provider.  2024 [employer] (2022-01-03 00:00:00)

## 2023-03-15 NOTE — Progress Notes (Signed)
[facility]    Provider:  Dr Lucia Gaskins Requesting Provider: [name], FNP Primary Care Provider:  [name], FNP  CC: Parkinson's disease  03/15/2023 qtc 410 and 70 bpm EKG. Already started antibiotics for UTI. She still wants treatment.  Discussed they have an appointment at [facility] next week ans should follow with them for any changes but patient feels this is a necesity and she can't live any long er with her hallucinations even thought she knows intuitively they are hallucinations/delusions. Will start low dose Start seroquel/quetiapine and follow up with [facility] Dr. Lucia Bitter  Patient feels like this is an emergency so she Is coming here and is seeing Dr. Karie Schwalbe . Hallucintaions. It doesn't do anything. She sees characters, one is a little child, one is a grown person and they come and move around they don;t talk but they are always around at the house. Started in February. She seems water on the floor and when she needs to use the bathroom at 4am she is scared to go. She is scared. She got up this morning and went. She is worried itis schizophrenia. She is worried about it. She is having delusions about a lady coming to give her shots and take her money. They both had covid recently and took medications. They asked about nuplazid. Patient says "I cannot do this anymore". She is forgetting things and she is worried about it like how to use the bathroom. Flush. But she does it. She had a fall in September of last year and December last year and 2 falls in may at the house. She is in PT/OT and is now using a cane and a rollater. Daughter is very caring and helps her all the time and she helps her with steps in the outside of the house and thinking about a ramp.   Patient complains of symptoms per HPI as well as the following symptoms: parkinson's disease with hallucinations . Pertinent negatives and positives per HPI. All others negative  12/27/2021: She is following with Dr.  Evern Bio and let her know she does not need to follow here anymore since Dr. Evern Bio is an excellent PD specialist. No falls. No significant tremors. Walking is good as far as she knows, she has been going to therapy in [place]. She is exercising at home. She is sleeping well. Memory is going well. No significant memory loss. She denies any weight loss. Her appetite is definitely good. No problems swallowing, No dyskinesias, smell has been impaired for a long time. No problems with the medication, working fine. She has been going to [facility]" here in [place] for speech therapy. Will defer to Dr. Evern Bio for any adjustments as her primary neurologist and will just refill her current medications. Answered all questions. Daughter present and provided much information. No other focal neurologic deficits, associated symptoms, inciting events or modifiable factors.  Patient complains of symptoms per HPI as well as the following symptoms: none . Pertinent negatives and positives per HPI. All others negative   12/21/2020: She is following with Dr. Evern Bio and let her know she does not need to follow here anymore since Dr. Evern Bio is an excellent PD specialist.  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 No falls. She does exercises at home. She said she needs to walk more. No choking on food, no losing weight. She has stable drooling not bothering her and just sometimes. Constipation is a problem. Swallowing fine, no hallucinations or delusions. Memory appears  ok. No dyskinesias. Provided resources in the community information.  Interval history 06/08/2020:   72 y.o. female here as requested by [name], FNP for trouble ambulating. PMHx CKD, obesity, TB, osteopenia, glaucoma with optic atrophy, B12 deficiency, fatigue, pre-diabetes.  Patient with hypophonia, hypokalemia, micrographia, shuffling gait, decreased arm swing, stooped posture, bradykinetic, suspect disorder in the  Parkinson's disease spectrum(idiopathic PD, lewy body etc)  or possibly NPH or vascular dementia causing gait apraxia.  I discussed with patient, DaTScan c/w Parkinsonian disorder and correlates with clinical findings.    She is doing well. She was seen by the Movement Disorder team at [facility] at my suggestion. She is taking B12. On exam there is bradykinesia/parkinsonism with decreased stride and freezing of gait has improved on Sinemet. Continue Sinemet and regular exercise was advised by [facility] and f/u in one year. She found the meeting helpful and we are just stay the course. She has no side effects to the medication. No dizziness, no difficulty swallowing, no falls. Freezing gait has improved.   Interval history February 17, 2020: Patient was seen alone last time for gait abnormality and parkinsonism, she is here today for follow-up and discussion and also brings her daughter.  Daughter provides much information today as well.  We discussed findings which included MRI of the brain which showed some mild chronic microvascular changes and mild atrophy.  However DaTscan was consistent with a parkinsonian syndrome.  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  We had an extended visit today, reviewed case with daughter, discussed findings on MRI of the brain and findings on DaTscan.  We discussed pathophysiology of Parkinson's disease, natural progression, treatment, answered all questions.  We discussed different treatment options, deep brain stimulation, clinical trials, skin checks for cancer which are increased in Parkinson's disease use, risk for falls, we reviewed thoughts of information with local resources including Parkinson's classes, Parkinson's support groups, online information.  We also discussed side effects of medications, dyskinesias, signs and symptoms of Parkinson's disease, she is not experiencing any swallowing difficulties at this time, we  discussed safety and falls.  We had a very long discussion, we started her on Sinemet, answered all questions, we will see her back in about 6 weeks to see how she is doing.   HPI:  Denise [name] is a 72 y.o. female here as requested by [name], FNP for trouble ambulating. PMHx CKD, obesity, TB, osteopenia, glaucoma with optic atrophy, B12 deficiency, fatigue, pre-diabetes.  I reviewed Denise [name]'s notes, daughter and nursing has her mom has been "tensing up when walking", stiffness, she has been going to a chiropractor and working with them for 6 months, sometimes she feels like a backpack on her back but no did not anything, someday she does better with water, she is writing smaller, taking longer to get dressed, gait slower, examination was largely unremarkable including cardiovascular, pulmonary, however a neurologic exam Romberg sign was slightly positive, slow to respond with some of her questions, mild cogwheeling to her right wrist.  She is here alone. She reports her best friend noticed her walking was "off" a little, she is doing better since seeing a chiropractor, she reports 3 years ago she was under a lot of stress and she kept to herself and that's when she started noticing symptoms, slowly progressive,, her daughter also noticed. She is a poor historian, she walks slumped over, she is slower than she used to be, she gets in the habit  of not picking feet up enough, no tremors, no falls, she does not use a cane, her handwriting is getting smaller, she has always had a soft voise, no vivid dreams,no dizziness or postural symptoms, smell impaired for a long time, about 20 years lost sense of smell, no problem with taste, she wears adult briefs for incontinence, no difficulty swallowing, she has low B12 and she is going to take injections, no constipation, no vision changes. No history of dopamine blocking drugs. No neck pain. No other focal neurologic deficits, associated symptoms, inciting  events or modifiable factors.  Reviewed notes, labs and imaging from outside physicians, which showed:  IMPRESSION: Personally reviewed images from 2014 x-ray lumbar spine and agree with the following   1.  Lateral projection is off axis significantly reducing sensitivity and specificity.  Suspected anterolisthesis at L5-S1. I cannot exclude pars defects at L5.  Lower lumbar facet arthropathy. 2. Prominence of stool throughout the colon suggests constipation. 3.  Expanded left pubic body is suggested on the frontal projection, possibly post-traumatic or from spurring.  Sed rate 2, CMP unremarkable slightly decreased GFR, vitamin B12 was 299 (in the normal range but may still be B12 deficiency), TSH normal, autoimmune panel double-stranded DNA/complement C3/ANA all negative or within normal range. Review of Systems: Patient complains of symptoms per HPI as well as the following symptoms: no tremor. Pertinent negatives and positives per HPI. All others negative    Social History   Socioeconomic History   Marital status: Single    Spouse name: Not on file   Number of children: 3   Years of education: Not on file   Highest education level: Master's degree (e.g., MA, MS, MEng, MEd, MSW, MBA)  Occupational History   Not on file  Tobacco Use   Smoking status: Never   Smokeless tobacco: Never  Vaping Use   Vaping status: Never Used  Substance and Sexual Activity   Alcohol use: Never   Drug use: No   Sexual activity: Not Currently    Birth control/protection: Post-menopausal  Other Topics Concern   Not on file  Social History Narrative   Lives with daughter and grandaughter    Right handed   Caffeine: none   Social Determinants of Health   Financial Resource Strain: Low Risk  (12/21/2022)   Overall Financial Resource Strain (CARDIA)    Difficulty of Paying Living Expenses: Not hard at all  Food Insecurity: No Food Insecurity (12/21/2022)   Hunger Vital Sign    Worried About  Running Out of Food in the Last Year: Never true    Ran Out of Food in the Last Year: Never true  Transportation Needs: No Transportation Needs (12/21/2022)   PRAPARE - Administrator, Civil Service (Medical): No    Lack of Transportation (Non-Medical): No  Physical Activity: Insufficiently Active (12/21/2022)   Exercise Vital Sign    Days of Exercise per Week: 5 days    Minutes of Exercise per Session: 20 min  Stress: No Stress Concern Present (12/21/2022)   [employer] of Occupational Health - Occupational Stress Questionnaire    Feeling of Stress : Not at all  Social Connections: Moderately Integrated (12/21/2022)   Social Connection and Isolation Panel [NHANES]    Frequency of Communication with Friends and Family: More than three times a week    Frequency of Social Gatherings with Friends and Family: More than three times a week    Attends Religious Services: More than 4 times per  year    Active Member of Clubs or Organizations: Yes    Attends Banker Meetings: 1 to 4 times per year    Marital Status: Widowed  Intimate Partner Violence: Not At Risk (11/02/2021)   Humiliation, Afraid, Rape, and Kick questionnaire    Fear of Current or Ex-Partner: No    Emotionally Abused: No    Physically Abused: No    Sexually Abused: No    Family History  Problem Relation Age of Onset   Hypertension Mother    Heart disease Mother    Diabetes Father    Hypertension Father    Other Father        had a condition that was "a cousin" to ALS    Diabetes Sister    Tuberculosis Maternal Grandmother    Heart disease Sister    Colitis Daughter    Colon cancer Neg Hx    Colon polyps Neg Hx    Esophageal cancer Neg Hx    Rectal cancer Neg Hx    Stomach cancer Neg Hx     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     Patient Active Problem List   Diagnosis Date Noted   Shuffling gait 03/18/2023   Worsened handwriting 03/18/2023   Coordination impairment 03/18/2023   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   Hallucinations 03/13/2023   Influenza vaccination declined 03/13/2023   Need for COVID-19 vaccine 12/30/2022   Fall 11/09/2022   Bradycardia 01/17/2022   Parkinson's disease 02/17/2020   Gait abnormality 12/04/2019   Prediabetes 12/03/2018   Vitamin D deficiency 12/03/2018   Anemia due to vitamin B12 deficiency 12/03/2018   Fatigue 12/03/2018   Urinary frequency 10/01/2018   Vitamin B12 deficiency 10/01/2018   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     carbidopa-levodopa (SINEMET IR) 25-100 MG tablet Take at 6am,12 and 6pm. Try to avoid protein. May take an extra during the day if needed. (Patient taking differently: 0.5 tablets. Take at 6am,12 and 6pm. Try to avoid protein. May take an extra during the day if needed.) 360 tablet 4   dorzolamide-timolol (COSOPT) 2-0.5 % ophthalmic solution Place 1 drop into the left eye 2 (two) times daily.     hydrochlorothiazide (HYDRODIURIL) 12.5 MG tablet TAKE 1 TABLET BY MOUTH EVERY DAY AS NEEDED FOR LEG SWELLING AS DIRECTED 90 tablet 1   ibuprofen (ADVIL) 800 MG tablet Take 800 mg by mouth every 8 (eight) hours as needed.      sulfamethoxazole-trimethoprim (BACTRIM DS) 800-160 MG tablet Take 1 tablet by mouth 2 (two) times daily. 10 tablet 0   Travoprost, BAK Free, (TRAVATAN) 0.004 %  SOLN ophthalmic solution 1 drop at bedtime.     Vitamin D, Ergocalciferol, (DRISDOL) 1.25 MG (50000 UNIT) CAPS capsule TAKE 1 CAPSULE (50,000 UNITS TOTAL) BY MOUTH EVERY 7 (SEVEN) DAYS 12 capsule 0   QUEtiapine (SEROQUEL) 25 MG tablet Take 1 tablet (25 mg total) by mouth at bedtime. 30 tablet 6   No current facility-administered medications for this visit.    Allergies as of 03/15/2023 - Review Complete 03/15/2023  Allergen Reaction Noted   Nystatin-triamcinolone Swelling 01/02/2020   Triamcinolone acetonide  06/08/2020   Cephalexin Itching and Rash 01/07/2020    Vitals: BP (!) 142/72   Pulse (!) 55   Ht 5\' 6"  (1.676 m)   Wt 124 lb (56.2 kg)   BMI 20.01 kg/m  Last Weight:  Wt Readings from Last 1 Encounters:  03/15/23 124 lb (56.2 kg)   Last Height:   Ht Readings from Last 1 Encounters:  03/15/23 5\' 6"  (1.676 m)    Exam: Thin and frail but Speech:    fluent Cognition:    Oriented to person and disorder and can discuss her symtoms, have good insight the hallucinations are hallucinations  Cranial Nerves: reduced blink and hypomimia, The pupils are equal, round, and reactive to light. Visual fields are full to threat. Extraocular movements are intact. Trigeminal sensation is intact and the muscles of mastication are normal. The face is symmetric. Hypomimia. The palate elevates in the midline. Hearing intact to voice. Voice is hypophonic. Shoulder shrug is normal. The tongue has normal motion without fasciculations.       Coordination:  No dysmetria, bradykinetic, amplitude decrements on movements such as finger and toe taps  Motor Observation:    No asymmetry, no tremor Tone: Mild rigidity of the upper extremities with facilitation   Strength:    Moving all limbs equally antigravity     Sensation: intact to  LT  Gait: using a rollator, can stand and walk independently, stooped, decreased stride, low clearance and narrow gait, no freezing, bradykinetic, no dyskinesias, not lightheaded or dizzy  Dtrs: brisk     Assessment/Plan:  72 y.o. female follow up PD: overall stable. tc 410 and 70 bpm EKG. Already started antibiotics for UTI. She still wants treatment.  Discussed they have an appointment at [facility] next week and should follow with them for any changes but patient feels this is a necesity and she can't live any long er with her hallucinations even thought she knows intuitively they are hallucinations/delusions. Will start low dose Start seroquel/quetiapine and follow up with [facility] Dr. Lucia Bitter, have not had goo response to nuplazid discussed with daughter and patient, black box warnings on seroquel, not approved for the elderly, risk of morbidity and mortality, risks vs benefits, they understand and and acknowledge and fervently want to start seroquel and will follow with [facility] and I have discussed NOT having 2 neurologists manage them but given the emergency situation they feel they are in and that they cannot contacy [place] will give her low dose and luckily they follow with specialist and excellent movement disorder specialist Dr. Karie Schwalbe next week.    - She is following with Dr. Evern Bio at [facility] and let her know she does not need to follow here anymore since Dr. Evern Bio is an excellent PD specialist and pateint is extremely stable, probably only needs yearly follow up at this time   - Dr. Kieth Brightly: The results of the current neuropsychological evaluation are consistent with subjective reports of changes in motor functioning,  which would also be consistent with a diagnosis of Parkinson's disease.  There are some cognitive issues outside of her motor deficits that are noted on the objective neuropsychological evaluation including deficits with regard to visual scanning and motor speed, initial  storage and organization of new information.  The patient retains good expressive language functioning, excellent auditory encoding capacity as well as the ability to retain information that is initially stored over a period of delay.  The pattern of cognitive strengths and weaknesses in overall medical history are not consistent with a diagnosis of cortical dementia such as Alzheimer's or Lewy body dementia.  The patient is not displaying any particular tremors and is denying any visual or auditory hallucinations or other changes in personality and behavior.  While the patient had difficulty storing new information the information that was initially stored was maintained over period of delay and there was no significant loss of information with delay.The patient does meet the criterion for dementia but it would be primarily related to symptoms associated with Parkinson's.  Gait changes in motor deficits, difficulties with memory are all noted.  Memory impairment along with apraxia along with changes in her overall function would be consistent with a neurocognitive disorder; the results of the current neuropsychological evaluation would be consistent with a primary degenerative psychomotor process with some impacts on memory and learning and other capacities being well maintained. - Silver Sneakers, is in PT currently and in Speech Therapy - continue Sinemet - no dyskinesias, stable doesn't need increase - Skin checks - increased risk skin cancer on PD - Exercise can slow progression of PD, gave information on local resources, PD groups and classes in the area.Discussed fall risks and precautions again  MRI of the brain w/wo contrast: very mild chronic microvascular ischemic change and mild generalized cortical atrophy most noted in the medial temporal lobes.  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  Answered questions, discussed hydration  Meds ordered this encounter  Medications   DISCONTD: QUEtiapine (SEROQUEL) 25 MG tablet    Sig: Take 1 tablet (25 mg total) by mouth at bedtime.    Dispense:  30 tablet    Refill:  6   QUEtiapine (SEROQUEL) 25 MG tablet    Sig: Take 1 tablet (25 mg total) by mouth at bedtime.    Dispense:  30 tablet    Refill:  6     Cc: [name], FNP,  [name], FNP  Naomie Dean, MD  [facility] [address] [place], [place] [address]  Phone [phone] Fax [phone]  I spent over 35 minutes of face-to-face and non-face-to-face time with patient on the  1. Parkinson's disease without dyskinesia, with fluctuating manifestations   2. Fine motor disability   3. Coordination impairment   4. Gait abnormality   5. Worsened handwriting   6. Shuffling gait   7. Hallucinations    diagnosis.  This included previsit chart review, lab review, study review, order entry, electronic health record documentation, patient education on the different diagnostic and therapeutic options, counseling and coordination of care, risks and benefits of management, compliance, or risk factor reduction

## 2023-03-18 DIAGNOSIS — R278 Other lack of coordination: Secondary | ICD-10-CM | POA: Insufficient documentation

## 2023-03-18 DIAGNOSIS — R2689 Other abnormalities of gait and mobility: Secondary | ICD-10-CM | POA: Insufficient documentation

## 2023-03-18 DIAGNOSIS — Z736 Limitation of activities due to disability: Secondary | ICD-10-CM | POA: Insufficient documentation

## 2023-03-19 ENCOUNTER — Other Ambulatory Visit: Payer: Self-pay | Admitting: Nurse Practitioner

## 2023-03-19 DIAGNOSIS — R600 Localized edema: Secondary | ICD-10-CM

## 2023-03-26 NOTE — Assessment & Plan Note (Addendum)
Her hallucinations may be related to her Parkinson's disease.  Urinalysis is normal negative for nitrates.  Small leuks.  Will send urine culture.  She has an appointment with neurology in the morning

## 2023-03-26 NOTE — Assessment & Plan Note (Signed)
Continue follow-up with neurology

## 2023-03-26 NOTE — Assessment & Plan Note (Signed)

## 2023-04-01 ENCOUNTER — Other Ambulatory Visit: Payer: Self-pay | Admitting: Nurse Practitioner

## 2023-04-01 DIAGNOSIS — M810 Age-related osteoporosis without current pathological fracture: Secondary | ICD-10-CM

## 2023-04-04 ENCOUNTER — Telehealth: Payer: Self-pay

## 2023-04-04 NOTE — Telephone Encounter (Signed)
*  [facility]  Pharmacy Patient Advocate Encounter   Received notification from CoverMyMeds that prior authorization for QUEtiapine Fumarate 25MG  tablets  is required/requested.   Insurance verification completed.   The patient is insured through [facility] .   Per test claim: PA required; PA started via CoverMyMeds. KEY [id number] . Waiting for clinical questions to populate.

## 2023-04-05 ENCOUNTER — Encounter: Payer: Self-pay | Admitting: Nurse Practitioner

## 2023-04-05 ENCOUNTER — Ambulatory Visit: Payer: Medicare Other | Admitting: Nurse Practitioner

## 2023-04-05 VITALS — BP 110/70 | HR 55 | Temp 98.7°F | Ht 66.0 in | Wt 122.0 lb

## 2023-04-05 DIAGNOSIS — G20A1 Parkinson's disease without dyskinesia, without mention of fluctuations: Secondary | ICD-10-CM

## 2023-04-05 DIAGNOSIS — R7303 Prediabetes: Secondary | ICD-10-CM

## 2023-04-05 DIAGNOSIS — Z9989 Dependence on other enabling machines and devices: Secondary | ICD-10-CM

## 2023-04-05 NOTE — Telephone Encounter (Signed)
Tiffany [email or web address]  called re: PA to inform of denial on QUEtiapine (SEROQUEL) 25 MG tablet there is a fax being sent over as well, If there are questions a call can be made to [phone] option 5

## 2023-04-05 NOTE — Progress Notes (Signed)
Madelaine Bhat, CMA,acting as a Neurosurgeon for [name], FNP.,have documented all relevant documentation on the behalf of [name], FNP,as directed by  [name], FNP while in the presence of [name], FNP.  Subjective:  Patient ID: Denise [name] , female    DOB: Nov 14, 1950 , 72 y.o.   MRN: [id number]  No chief complaint on file.   HPI  Patient presents today for a pre dm follow up, Patient reports compliance with medication. Patient denies any chest pain, SOB, or headaches. Patient would also like a home health aid. Patient would need aid for dressing, meal prep, using restroom and sitter when home alone. She has been seen at Neuro and started on a new medication for hallucinations and to return for f/u.   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     Family History  Problem Relation Age of Onset   Hypertension Mother    Heart disease Mother    Diabetes Father    Hypertension Father    Other Father        had a condition that was "a cousin" to ALS    Diabetes Sister    Tuberculosis Maternal Grandmother    Heart disease Sister    Colitis Daughter    Colon cancer Neg Hx    Colon polyps Neg Hx    Esophageal cancer Neg Hx    Rectal cancer Neg Hx    Stomach cancer Neg Hx      Current Outpatient Medications:    alendronate (FOSAMAX) 70 MG tablet, TAKE 1 TABLET (70 MG TOTAL) BY MOUTH EVERY 7 DAYS WITH FULL GLASS WATER ON EMPTY STOMACH, Disp: 12 tablet, Rfl: 1   atorvastatin (LIPITOR) 10 MG tablet, TAKE 1 TABLET BY MOUTH EVERY DAY AT BEDTIME MONDAY THROUGH FRIDAY, Disp: 65 tablet, Rfl: 4   Calcium Carb-Cholecalciferol (OYSTER SHELL CALCIUM/VITAMIN D) 500-200 MG-UNIT PACK, Take by mouth., Disp: , Rfl:     carbidopa-levodopa (SINEMET IR) 25-100 MG tablet, Take at 6am,12 and 6pm. Try to avoid protein. May take an extra during the day if needed. (Patient taking differently: 0.5 tablets. Take at 6am,12 and 6pm. Try to avoid protein. May take an extra during the day if needed.), Disp: 360 tablet, Rfl: 4   dorzolamide-timolol (COSOPT) 2-0.5 % ophthalmic solution, Place 1 drop into the left eye 2 (two) times daily., Disp: , Rfl:    hydrochlorothiazide (HYDRODIURIL) 12.5 MG tablet, TAKE 1 TABLET BY MOUTH EVERY DAY AS NEEDED FOR LEG SWELLING AS DIRECTED, Disp: 90 tablet, Rfl: 1   ibuprofen (ADVIL) 800 MG tablet, Take 800 mg by mouth every 8 (eight) hours as needed., Disp: , Rfl:    QUEtiapine (SEROQUEL) 25 MG tablet, Take 1 tablet (25 mg total) by mouth at bedtime., Disp: 30 tablet, Rfl: 6   sulfamethoxazole-trimethoprim (BACTRIM DS) 800-160 MG tablet, Take 1 tablet by mouth 2 (two) times daily., Disp: 10 tablet, Rfl: 0   Travoprost, BAK Free, (TRAVATAN) 0.004 % SOLN ophthalmic solution, 1 drop at bedtime., Disp: , Rfl:    Vitamin D, Ergocalciferol, (DRISDOL) 1.25 MG (50000 UNIT) CAPS capsule, TAKE 1 CAPSULE (50,000 UNITS TOTAL)  BY MOUTH EVERY 7 (SEVEN) DAYS, Disp: 12 capsule, Rfl: 0   Allergies  Allergen Reactions   Nystatin-Triamcinolone Swelling    BLISTERS   Triamcinolone Acetonide     Blisters    Cephalexin Itching and Rash     Review of Systems  Constitutional: Negative.   Respiratory: Negative.    Cardiovascular: Negative.   Neurological: Negative.   Psychiatric/Behavioral: Negative.       Today's Vitals   04/05/23 1522  BP: 110/70  Pulse: (!) 55  Temp: 98.7 F (37.1 C)  TempSrc: Oral  Weight: 122 lb (55.3 kg)  Height: 5\' 6"  (1.676 m)  PainSc: 0-No pain   Body mass index is 19.69 kg/m.  Wt Readings from Last 3 Encounters:  04/05/23 122 lb (55.3 kg)  03/15/23 124 lb (56.2 kg)  03/13/23 126 lb (57.2 kg)      Objective:  Physical Exam Vitals reviewed.  Constitutional:       General: She is not in acute distress.    Appearance: Normal appearance.  Cardiovascular:     Rate and Rhythm: Normal rate and regular rhythm.     Pulses: Normal pulses.     Heart sounds: Normal heart sounds. No murmur heard. Pulmonary:     Effort: Pulmonary effort is normal. No respiratory distress.     Breath sounds: Normal breath sounds. No wheezing.  Musculoskeletal:        General: No swelling or tenderness.     Comments: Using rolling walker  Skin:    General: Skin is warm and dry.     Capillary Refill: Capillary refill takes less than 2 seconds.  Neurological:     General: No focal deficit present.     Mental Status: She is alert and oriented to person, place, and time.     Cranial Nerves: No cranial nerve deficit.     Motor: No weakness.  Psychiatric:        Mood and Affect: Mood normal.        Behavior: Behavior normal.        Thought Content: Thought content normal.        Judgment: Judgment normal.         Assessment And Plan:  Prediabetes Assessment & Plan: Hemoglobin A1c is stable.  Will check hemoglobin A1c today.  Diet controlled  Orders: -     Hemoglobin A1c -     CMP14+EGFR  Parkinson's disease without dyskinesia, unspecified whether manifestations fluctuate (HCC) Assessment & Plan: She has been started on a new medication by neurology due to increase in hallucinations likely related to her Parkinson's disease.  Her daughter mentions that she may need some more assistance for dressing, meal prep and bathroom assistance however she does not have Medicaid and is unable to get PCS services.  I have advised her she can pay for a personal or private duty assistant provide her with phone numbers or companies.  May consider referral to social worker when she is ready     Return for controlled DM check 4 months.  Patient was given opportunity to ask questions. Patient verbalized understanding of the plan and was able to repeat key elements of the plan. All  questions were answered to their satisfaction.    Jeanell Sparrow, FNP, have reviewed all documentation for this visit. The documentation on 04/05/23 for the exam, diagnosis, procedures, and orders are all accurate and complete.   IF YOU HAVE BEEN REFERRED TO A SPECIALIST, IT MAY TAKE 1-2 WEEKS  TO SCHEDULE/PROCESS THE REFERRAL. IF YOU HAVE NOT HEARD FROM US/SPECIALIST IN TWO WEEKS, PLEASE GIVE [place] A CALL AT [phone].

## 2023-04-05 NOTE — Patient Instructions (Addendum)
Administrator in [place] - Phone: [phone]

## 2023-04-05 NOTE — Progress Notes (Deleted)
Madelaine Bhat, CMA,acting as a Neurosurgeon for [name], FNP.,have documented all relevant documentation on the behalf of [name], FNP,as directed by  [name], FNP while in the presence of [name], FNP.  Subjective:  Patient ID: Denise [name] , female    DOB: 1950/11/30 , 72 y.o.   MRN: [id number]  No chief complaint on file.   HPI  Patient presents today for wanting a home health aid, Patient reports compliance with medication. Patient denies any chest pain, SOB, or headaches. Patient has no concerns today.     Past Medical History:  Diagnosis Date  . Chronic kidney disease    as a child, no present issues   . Glaucoma   . Osteopenia 06/2018   T score -1.9 FRAX 3% / 0.2%  . 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 Hypertension Mother   . Heart disease Mother   . Diabetes Father   . Hypertension Father   . Other Father        had a condition that was "a cousin" to ALS   . Diabetes Sister   . Tuberculosis Maternal Grandmother   . Heart disease Sister   . Colitis Daughter   . Colon cancer Neg Hx   . Colon polyps Neg Hx   . Esophageal cancer Neg Hx   . Rectal cancer Neg Hx   . Stomach cancer Neg Hx      Current Outpatient Medications:  .  alendronate (FOSAMAX) 70 MG tablet, TAKE 1 TABLET (70 MG TOTAL) BY MOUTH EVERY 7 DAYS WITH FULL GLASS WATER ON EMPTY STOMACH, Disp: 12 tablet, Rfl: 1 .  atorvastatin (LIPITOR) 10 MG tablet, TAKE 1 TABLET BY MOUTH EVERY DAY AT BEDTIME MONDAY THROUGH FRIDAY, Disp: 65 tablet, Rfl: 4 .  Calcium Carb-Cholecalciferol (OYSTER SHELL CALCIUM/VITAMIN D) 500-200 MG-UNIT PACK, Take by mouth., Disp: , Rfl:  .  carbidopa-levodopa (SINEMET IR) 25-100 MG tablet, Take at 6am,12 and 6pm. Try to avoid protein. May take an extra during the day if needed. (Patient taking differently: 0.5 tablets. Take at 6am,12 and 6pm. Try to avoid protein. May take an extra during the day if needed.), Disp: 360 tablet,  Rfl: 4 .  dorzolamide-timolol (COSOPT) 2-0.5 % ophthalmic solution, Place 1 drop into the left eye 2 (two) times daily., Disp: , Rfl:  .  hydrochlorothiazide (HYDRODIURIL) 12.5 MG tablet, TAKE 1 TABLET BY MOUTH EVERY DAY AS NEEDED FOR LEG SWELLING AS DIRECTED, Disp: 90 tablet, Rfl: 1 .  ibuprofen (ADVIL) 800 MG tablet, Take 800 mg by mouth every 8 (eight) hours as needed., Disp: , Rfl:  .  QUEtiapine (SEROQUEL) 25 MG tablet, Take 1 tablet (25 mg total) by mouth at bedtime., Disp: 30 tablet, Rfl: 6 .  sulfamethoxazole-trimethoprim (BACTRIM DS) 800-160 MG tablet, Take 1 tablet by mouth 2 (two) times daily., Disp: 10 tablet, Rfl: 0 .  Travoprost, BAK Free, (TRAVATAN) 0.004 % SOLN ophthalmic solution, 1 drop at bedtime., Disp: , Rfl:  .  Vitamin D, Ergocalciferol, (DRISDOL) 1.25 MG (50000 UNIT) CAPS capsule, TAKE 1 CAPSULE (50,000 UNITS TOTAL) BY MOUTH EVERY 7 (SEVEN) DAYS, Disp: 12 capsule, Rfl: 0   Allergies  Allergen Reactions  . Nystatin-Triamcinolone Swelling    BLISTERS  . Triamcinolone Acetonide     Blisters   . Cephalexin Itching and Rash     Review of Systems   There were no vitals filed for this visit.  There is no height or weight on file to calculate BMI.  Wt Readings from Last 3 Encounters:  03/15/23 124 lb (56.2 kg)  03/13/23 126 lb (57.2 kg)  12/21/22 126 lb 6.4 oz (57.3 kg)    The ASCVD Risk score (Arnett DK, et al., 2019) failed to calculate for the following reasons:   The valid total cholesterol range is 130 to 320 mg/dL  Objective:  Physical Exam      Assessment And Plan:  There are no diagnoses linked to this encounter.  No follow-ups on file.  Patient was given opportunity to ask questions. Patient verbalized understanding of the plan and was able to repeat key elements of the plan. All questions were answered to their satisfaction.    Jeanell Sparrow, FNP, have reviewed all documentation for this visit. The documentation on 04/05/23 for the exam, diagnosis,  procedures, and orders are all accurate and complete.   IF YOU HAVE BEEN REFERRED TO A SPECIALIST, IT MAY TAKE 1-2 WEEKS TO SCHEDULE/PROCESS THE REFERRAL. IF YOU HAVE NOT HEARD FROM US/SPECIALIST IN TWO WEEKS, PLEASE GIVE [place] A CALL AT [phone].

## 2023-04-05 NOTE — Telephone Encounter (Signed)
I spoke with the pt's daughter [name] and discussed the seroquel. She is aware that insurance denied it however it can still be filled at the pharmacy with a goodrx coupon. I answered her questions and she verbalized appreciation for the call.

## 2023-04-06 LAB — CMP14+EGFR
ALT: 5 [IU]/L (ref 0–32)
AST: 13 [IU]/L (ref 0–40)
Albumin: 4.1 g/dL (ref 3.9–4.9)
Alkaline Phosphatase: 44 [IU]/L (ref 44–121)
BUN/Creatinine Ratio: 20 (ref 12–28)
BUN: 20 mg/dL (ref 8–27)
Bilirubin Total: 0.6 mg/dL (ref 0.0–1.2)
CO2: 23 mmol/L (ref 20–29)
Calcium: 9.2 mg/dL (ref 8.7–10.3)
Chloride: 109 mmol/L — ABNORMAL HIGH (ref 96–106)
Creatinine, Ser: 1.01 mg/dL — ABNORMAL HIGH (ref 0.57–1.00)
Globulin, Total: 2.4 g/dL (ref 1.5–4.5)
Glucose: 114 mg/dL — ABNORMAL HIGH (ref 70–99)
Potassium: 3.9 mmol/L (ref 3.5–5.2)
Sodium: 146 mmol/L — ABNORMAL HIGH (ref 134–144)
Total Protein: 6.5 g/dL (ref 6.0–8.5)
eGFR: 60 mL/min/{1.73_m2} (ref >59)

## 2023-04-06 LAB — HEMOGLOBIN A1C
Est. average glucose Bld gHb Est-mCnc: 126 mg/dL
Hgb A1c MFr Bld: 6 % — ABNORMAL HIGH (ref 4.8–5.6)

## 2023-04-12 DIAGNOSIS — G20A2 Parkinson's disease without dyskinesia, with fluctuations: Secondary | ICD-10-CM | POA: Diagnosis not present

## 2023-04-20 ENCOUNTER — Encounter: Payer: Self-pay | Admitting: Nurse Practitioner

## 2023-04-20 NOTE — Assessment & Plan Note (Signed)
She has been started on a new medication by neurology due to increase in hallucinations likely related to her Parkinson's disease.  Her daughter mentions that she may need some more assistance for dressing, meal prep and bathroom assistance however she does not have Medicaid and is unable to get PCS services.  I have advised her she can pay for a personal or private duty assistant provide her with phone numbers or companies.  May consider referral to social worker when she is ready

## 2023-04-20 NOTE — Assessment & Plan Note (Signed)
Hemoglobin A1c is stable.  Will check hemoglobin A1c today.  Diet controlled

## 2023-04-24 ENCOUNTER — Ambulatory Visit: Payer: Medicare Other | Admitting: Nurse Practitioner

## 2023-04-24 NOTE — Therapy (Signed)
[facility] [address] [place], [place], [address] Phone: [phone]   Fax:  [phone]  Patient Details  Name: Denise [name] MRN: [id number] Date of Birth: 05-20-1951 Referring Provider:  [name], FNP  Encounter Date: 04/25/2023  Occupational Therapy Parkinson's Disease Screen  Hand dominance:  Rt    Physical Performance Test item #2 (simulated eating):  11.62 sec  Physical Performance Test item #4 (donning/doffing jacket):  25 sec  9-hole peg test:    RUE  27.47 sec        LUE  37.18 sec  Change in ability to perform ADLs/IADLs:  mild micrographia at end of sentence, 100% legible  Other Comments:  BUE AROM WFLs  Pt does not require occupational therapy services at this time.  Recommended occupational therapy screen in  6 months    Sheran Lawless, OT 04/24/2023, 12:24 PM  [facility] [address] [place], [place], [address] Phone: [phone]   Fax:  [phone]

## 2023-04-25 ENCOUNTER — Ambulatory Visit: Payer: Medicare Other | Attending: Nurse Practitioner | Admitting: Physical Therapy

## 2023-04-25 ENCOUNTER — Ambulatory Visit: Payer: Medicare Other | Admitting: Occupational Therapy

## 2023-04-25 ENCOUNTER — Ambulatory Visit: Payer: Medicare Other | Admitting: Speech Pathology

## 2023-04-25 DIAGNOSIS — R471 Dysarthria and anarthria: Secondary | ICD-10-CM | POA: Insufficient documentation

## 2023-04-25 DIAGNOSIS — R2681 Unsteadiness on feet: Secondary | ICD-10-CM | POA: Insufficient documentation

## 2023-04-25 DIAGNOSIS — R1310 Dysphagia, unspecified: Secondary | ICD-10-CM

## 2023-04-25 DIAGNOSIS — R278 Other lack of coordination: Secondary | ICD-10-CM

## 2023-04-25 NOTE — Therapy (Signed)
[facility] [address] [place], [place], [address] Phone: [phone]   Fax:  [phone]  Patient Details  Name: Denise [name] MRN: [id number] Date of Birth: 1951-04-05 Referring Provider:  [name], FNP  Encounter Date: 04/25/2023  Physical Therapy Parkinson's Disease Screen  Pt has had 2 falls since she was last here from therapy. Reports that she exercises with her daughter. For walking, using a cane in the house and a rollator out in the community.   Did not get to assess outcome measures as pt arrived late/had to use the restroom. Only had a few minutes with pt until she had her next PD screen.   Patient would benefit from Physical Therapy evaluation due to falls. Pt would benefit from a tune up for PT.  Drake Leach, PT, DPT 04/25/2023, 1:27 PM  [facility] [address] [place], [place], [address] Phone: [phone]   Fax:  [phone]

## 2023-04-25 NOTE — Therapy (Signed)
[facility] [address] [place], [place], [address] Phone: [phone]   Fax:  [phone]  Patient Details  Name: Denise [name] MRN: [id number] Date of Birth: Mar 22, 1951 Referring Provider:  [name], FNP  Encounter Date: 04/25/2023  Speech Therapy Parkinson's Disease Screen   Decibel Level today: low 60s dB  (WNL=70-72 dB) with sound level meter 30cm away from pt's mouth. Pt reports to daily HEP practice. Pt's conversational volume has remained consistant since last treatment course  Pt reports difficulty with swallowing, which would benefit from further evaluation. Pt with increased coughing with PO intake. ST to order a MBSS.   Pt reports feeling like cognition has declined. She is having to ask same questions repeatedly, forgetting where things are, losing train of thought  Pt would benefit from speech-language eval for dysarthria  and bedside swallow eval  - please order via EPIC   Maia Breslow, CCC-SLP 04/25/2023, 1:27 PM  [facility] [address] [place], [place], [address] Phone: [phone]   Fax:  [phone]

## 2023-04-27 ENCOUNTER — Other Ambulatory Visit (HOSPITAL_COMMUNITY): Payer: Self-pay | Admitting: *Deleted

## 2023-04-27 DIAGNOSIS — R1310 Dysphagia, unspecified: Secondary | ICD-10-CM

## 2023-05-01 ENCOUNTER — Telehealth: Payer: Self-pay | Admitting: Physical Therapy

## 2023-05-01 DIAGNOSIS — H401112 Primary open-angle glaucoma, right eye, moderate stage: Secondary | ICD-10-CM | POA: Diagnosis not present

## 2023-05-01 DIAGNOSIS — H04123 Dry eye syndrome of bilateral lacrimal glands: Secondary | ICD-10-CM | POA: Diagnosis not present

## 2023-05-01 DIAGNOSIS — H2513 Age-related nuclear cataract, bilateral: Secondary | ICD-10-CM | POA: Diagnosis not present

## 2023-05-01 DIAGNOSIS — H401123 Primary open-angle glaucoma, left eye, severe stage: Secondary | ICD-10-CM | POA: Diagnosis not present

## 2023-05-01 NOTE — Telephone Encounter (Signed)
Faxed as provider not in EPIC:  Dr. Evern Bio, Denise [name] was seen for Parkinson's Disease 6 month screens on 04/25/23. The patient would benefit from a PT evaluation for hx of falls and ST evaluation for dysarthria .   If you agree, please place an order for Parkinson's Diease in [facility] or fax the order to [phone].  Thank you, [name], PT, DPT 05/01/23 2:33 PM    [facility] [address] [place], [place]  [address] Phone:  [phone] Fax:  [phone]

## 2023-05-11 ENCOUNTER — Ambulatory Visit (HOSPITAL_COMMUNITY)
Admission: RE | Admit: 2023-05-11 | Discharge: 2023-05-11 | Disposition: A | Discharge status: Home / Self Care | Payer: Medicare Other | Source: Ambulatory Visit | Attending: *Deleted | Admitting: *Deleted

## 2023-05-11 ENCOUNTER — Ambulatory Visit (HOSPITAL_COMMUNITY): Payer: Medicare Other

## 2023-05-11 DIAGNOSIS — R1310 Dysphagia, unspecified: Secondary | ICD-10-CM

## 2023-05-11 DIAGNOSIS — R471 Dysarthria and anarthria: Secondary | ICD-10-CM

## 2023-05-15 ENCOUNTER — Other Ambulatory Visit (HOSPITAL_COMMUNITY): Payer: Self-pay | Admitting: Nurse Practitioner

## 2023-05-15 ENCOUNTER — Other Ambulatory Visit: Payer: Self-pay | Admitting: Nurse Practitioner

## 2023-05-15 DIAGNOSIS — R1310 Dysphagia, unspecified: Secondary | ICD-10-CM

## 2023-05-15 DIAGNOSIS — G20A1 Parkinson's disease without dyskinesia, without mention of fluctuations: Secondary | ICD-10-CM

## 2023-05-22 ENCOUNTER — Ambulatory Visit: Payer: Medicare Other | Attending: Psychiatry | Admitting: Physical Therapy

## 2023-05-22 ENCOUNTER — Other Ambulatory Visit: Payer: Self-pay

## 2023-05-22 VITALS — BP 127/71 | HR 55

## 2023-05-22 DIAGNOSIS — R2681 Unsteadiness on feet: Secondary | ICD-10-CM | POA: Diagnosis not present

## 2023-05-22 DIAGNOSIS — R269 Unspecified abnormalities of gait and mobility: Secondary | ICD-10-CM | POA: Diagnosis not present

## 2023-05-22 DIAGNOSIS — M6281 Muscle weakness (generalized): Secondary | ICD-10-CM | POA: Diagnosis not present

## 2023-05-22 NOTE — Therapy (Unsigned)
OUTPATIENT PHYSICAL THERAPY NEURO EVALUATION   Patient Name: Denise [name] MRN: [id number] DOB:Jan 10, 1951, 72 y.o., female Today's Date: 05/23/2023   PCP: [name], FNP REFERRING PROVIDER: Evern Bio Chales Abrahams, MD    END OF SESSION:  PT End of Session - 05/22/23 1234     Visit Number 1    Number of Visits 9    Date for PT Re-Evaluation 07/03/23    Authorization Type [employer]    PT Start Time 1230    PT Stop Time 1315    PT Time Calculation (min) 45 min    Equipment Utilized During Treatment Gait belt    Activity Tolerance Patient tolerated treatment well    Behavior During Therapy [facility] for tasks assessed/performed             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   Past Surgical History:  Procedure Laterality Date   TUBAL LIGATION     Patient Active Problem List   Diagnosis Date Noted   Shuffling gait 03/18/2023   Worsened handwriting 03/18/2023   Coordination impairment 03/18/2023   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   Hallucinations 03/13/2023   Influenza vaccination declined 03/13/2023   Need for COVID-19 vaccine 12/30/2022   Fall 11/09/2022   Bradycardia 01/17/2022   Parkinson's disease (HCC) 02/17/2020   Gait abnormality 12/04/2019   Prediabetes 12/03/2018   Vitamin D deficiency 12/03/2018   Anemia due to vitamin B12 deficiency 12/03/2018   Fatigue 12/03/2018   Urinary frequency 10/01/2018   Vitamin B12 deficiency 10/01/2018   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   Abnormal auditory perception of both ears 05/10/2016   Osteopenia 08/23/2012    ONSET DATE: 05/15/2023 (referral date)  REFERRING DIAG: G20.A1 (ICD-10-CM) - Parkinson's disease without dyskinesia, without mention of fluctuations  THERAPY DIAG:  Abnormality of gait and  mobility - Plan: PT plan of care cert/re-cert  Muscle weakness (generalized) - Plan: PT plan of care cert/re-cert  Unsteadiness on feet - Plan: PT plan of care cert/re-cert  Rationale for Evaluation and Treatment: Rehabilitation  SUBJECTIVE:                                                                                                                                                                                             SUBJECTIVE STATEMENT: Patient provides partial subjective but daughter provides primary subjective.  Patient family  reports 2 falls since patient last D/C from PT. One episode due to freezing in the kitchen and struggle with turn. Family reports ongoing changes in memory and patient recently started having hallucinations which include sometimes seeing three people, different version of granddaughter, or a dog. Patient started on medication by neurologist to help with hallucinations but reported min improvements ~ month in. Patient currently taking carbidopa-levodopa 4x daily, typically around 9-10 and 12-1 for two of the doses. Family reports also noticed a decrease in patient's speed and occasionally having a hard time moving feet.   Pt accompanied by: family member daughter and granddaughter  PERTINENT HISTORY: PD with hallucinations, glaucoma, osteoporosis, TB (as a child)    PAIN:  Are you having pain? No  PRECAUTIONS: Fall and Other: Osteoporosis  WEIGHT BEARING RESTRICTIONS: No  FALLS: Has patient fallen in last 6 months? Yes. Number of falls 2  LIVING ENVIRONMENT: Lives with: lives with their family - daughter and granddaughter  Lives in: House/apartment Stairs: Yes: External: 2 steps; none Has following equipment at home: Single point cane and Grab bars  PLOF: Independent, pt's daughter reports sometimes pt needs help with dressing   PATIENT GOALS: See how she was since the last time she was here. Work on the balance   OBJECTIVE:    COGNITION: Overall cognitive status: Impaired   SENSATION: Grossly WFL with mild intermittent tingling in feet  COORDINATION: LE Heel to shin: bradykinentic  POSTURE: rounded shoulders, forward head, trunk flexed, posterior pelvic tilt, and weight shift left, trunk lean and L lateral head tilt to L in sitting/standing  BED MOBILITY:  To be assessed  TRANSFERS: Assistive device utilized: Environmental consultant - 4 wheeled  Sit to stand: SBA and Min A Stand to sit: SBA and Min A MinA for safety intermittently with verbal cues and tactile cues, Performs with mix of no UE and UE support during session; requires cues for lining up to chair during session and occasionally with motor planning, particular difficulty noted with turning approaches to chair, requires visual and tactile cues for lateral stepping, multiple episodes of freezing during session  GAIT: Gait pattern: step through pattern, decreased arm swing- Right, decreased step length- Left, decreased stride length, decreased ankle dorsiflexion- Right, decreased ankle dorsiflexion- Left, Right foot flat, Left foot flat, shuffling, lateral lean- Left, decreased trunk rotation, and trunk flexed Distance walked: Clinic distances  Assistive device utilized: Environmental consultant - 4 wheeled Level of assistance: SBA and CGA Comments: Requires cues occassionally for navigation and pacing, end blocking noted on turns with mild festination of step  FUNCTIONAL TESTS:     05/22/23 0001  Standardized Balance Assessment  Standardized Balance Assessment Five Times Sit to Stand;TUG;10 meter walk test;Mini-BESTest  Five times sit to stand comments  13.20 (seconds without UE use (smaller movements for final 2 reps, SBA))  10 Meter Walk 2.41 ft/sec with rollator (SBA-CGA (cues to keep rollator close to body))  Mini-BESTest  Sit To Stand 1 (scored down due to difficulty achieving full ROM consistently)  Compensatory Stepping Correction - Forward 1 (5 steps)   Compensatory Stepping Correction - Backward 1 (6 small steps)  Compensatory Stepping Correction - Left Lateral 1 (2-3 mini steps)  Compensatory Stepping Correction - Right Lateral 1 (3-4 steps, difficulty with weight shift off of left side)  Stepping Corredtion Lateral - lowest score 1  Timed UP & GO with Dual Task 0 ((dual task was dual task) stop telling fishing story with dual task and requires minA to end freezing  episode)  Timed Up and Go Test  Normal TUG (seconds) 82 (seconds on first rep due to confusion with coming to chair and sitting down (1 episode of freezing), 40.91 seconds on second set still requiring min cues to sit fully back in chair with 2WW)  Cognitive TUG (seconds) 92 (seconds (1 episode of freezing, had talk about fishing story but unable to finish and required cues when going back to sit down))    Will finish miniBEST at next session.   TODAY'S TREATMENT:                                                                                                                               Initial Eval only   PATIENT EDUCATION: Education details: POC, examination findings, goal collaboration Person educated: Patient and daughter, granddaughter  Education method: Explanation, Facilities manager, and Verbal cues Education comprehension: verbalized understanding, returned demonstration, and needs further education  HOME EXERCISE PROGRAM: Copied over from previous bout of therapy: Will review/updated as needed; recommend checking out.   Standing and seated PWR, Pt has in her exercise folder.     Access Code: [email or web address] URL: [email or web address] Date: 10/19/2021 Prepared by: [name]   Reviewed/condensed exercises for balance:    Exercises - Alternating Step Backward with Support  - 1 x daily - 5 x weekly - 1-2 sets - 10 reps - Romberg Stance Eyes Closed on Foam Pad  - 1 x daily - 5 x weekly - 3 sets - 30 hold   Plus standing tall against the wall  for posture - 2 x 30 second holds    GOALS: Goals reviewed with patient? Yes  SHORT TERM GOALS: STG = LTG due to POC length  LONG TERM GOALS: Target date: 07/03/2023  Pt will be independent with final PD HEP for improved strength, balance, transfers, and gait  Baseline: To be provided/reviewed from last session Goal status: INITIAL  2.  miniBEST to be completed with LTG written.  Baseline: To be completed Goal status: INITIAL  3.  Pt will improve gait speed with LRAD to at least 2.71 ft/sec in order to demo improved community mobility.   Baseline: 2.41 ft/sec with rollator Goal status: INITIAL  4.  Patient will improve TUG score to 35 seconds with LRAD to indicate a decreased risk of falls and demonstrate improved overall mobility.   Baseline: 82 to 40.91 seconds with range due to freezing episodes and rollator with SBA-minA  Goal status: INITIAL  5.  Pt will improve 5x sit<>stand to less than or equal to 12 sec to demonstrate improved functional strength and transfer efficiency. Baseline: 13.2 seconds with no UE support Goal status: INITIAL  ASSESSMENT:  CLINICAL IMPRESSION: Patient is a 72 year old female known to this clinic with a referral for treatment related to PD impairment. Since pt was here, pt has had 2 falls and family noticing increase in hallucinations. Pt now started on a medication within last  month to help manage hallucinations. Pt demonstrates reduction in gait speed, increased freezing episodes with TUG, and challenges achieving full stand with repetition on 5xSTS. Patient unable to complete dual task with TUG in today's session. Initiated miniBEST but will require completion next session but patient noted to having ongoing challenges with stepping reactions with greatest challenges noted in A/P direction in today's session. Pt using rollator at this time. Pt would benefit from skilled PT to address these impairments and functional limitations to maximize  functional mobility independence and decr fall risk.    OBJECTIVE IMPAIRMENTS: Abnormal gait, decreased activity tolerance, decreased balance, decreased cognition, decreased coordination, decreased knowledge of use of DME, decreased mobility, difficulty walking, decreased strength, decreased safety awareness, impaired flexibility, impaired tone, and postural dysfunction.   ACTIVITY LIMITATIONS: carrying, lifting, bending, stairs, transfers, dressing, and locomotion level  PARTICIPATION LIMITATIONS: meal prep, cleaning, driving, shopping, and community activity  PERSONAL FACTORS: Age, Behavior pattern, Past/current experiences, Time since onset of injury/illness/exacerbation, and 3+ comorbidities: PD, glaucoma, osteoporosis (newly diagnosed), TB (as a child)    are also affecting patient's functional outcome.   REHAB POTENTIAL: Good  CLINICAL DECISION MAKING: Evolving/moderate complexity  EVALUATION COMPLEXITY: Moderate  PLAN:  PT FREQUENCY: 2x/week  PT DURATION: 4 weeks  PLANNED INTERVENTIONS: Therapeutic exercises, Therapeutic activity, Neuromuscular re-education, Balance training, Gait training, Patient/Family education, Self Care, Stair training, Vestibular training, Visual/preceptual remediation/compensation, DME instructions, Manual therapy, and Re-evaluation  PLAN FOR NEXT SESSION: Finish miniBEST and update goal. Review HEP and update as needed. Work on turning before sitting down and postural awareness, work on stepping reaction and weight shift   Carmelia Bake, PT, DPT  05/23/2023, 8:40 AM

## 2023-05-25 ENCOUNTER — Ambulatory Visit (HOSPITAL_COMMUNITY)
Admission: RE | Admit: 2023-05-25 | Discharge: 2023-05-25 | Disposition: A | Discharge status: Home / Self Care | Payer: Medicare Other | Source: Ambulatory Visit | Attending: *Deleted | Admitting: *Deleted

## 2023-05-25 ENCOUNTER — Ambulatory Visit (HOSPITAL_COMMUNITY)
Admission: RE | Admit: 2023-05-25 | Discharge: 2023-05-25 | Disposition: A | Discharge status: Home / Self Care | Payer: Medicare Other | Source: Ambulatory Visit | Attending: Nurse Practitioner | Admitting: Nurse Practitioner

## 2023-05-25 DIAGNOSIS — G20A1 Parkinson's disease without dyskinesia, without mention of fluctuations: Secondary | ICD-10-CM | POA: Diagnosis not present

## 2023-05-25 DIAGNOSIS — R1310 Dysphagia, unspecified: Secondary | ICD-10-CM | POA: Diagnosis not present

## 2023-05-25 NOTE — Progress Notes (Signed)
Modified Barium Swallow Study  Patient Details  Name: Denise [name] MRN: [id number] Date of Birth: [birth year]  Today's Date: 05/25/2023  Modified Barium Swallow completed.  Full report located under Chart Review in the Imaging Section.  History of Present Illness Pt is a 72 YO F with PMH of PD. PD symptoms began in 2020. She is followed by OP ST at [facility], completed prior ST course addressing cognition and dysarthria. Pt reports increased coughing with solids and liquids.   Clinical Impression Ms. Denise [name] presents with functional oropharyngeal swallow. She accepted PO trials of thin, mildly thick, and moderately thick liquids, puree and dysphagia 3 solids. Lingual control was good during directed bolus hold, though during A-P transit some lingual pumping exhibited. Reduced base of tongue retraction also evidenced with narrow column of bolus between base of tongue and posterior pharyngeal wll. All bolus consistencies were noted to leave mild reside in oral cavity with pt moving into pharynx following initial swallow. Pt did not appear sensate to this trace amount of residue, no subsequent swallow to clear noted across exam. Pt's pharyngeal function appears to be within normal limits with complete closure of airway via hyolaryngeal elevation and epiglottic inversion. No penetration or aspiration of any inconsistencies noted. Previously mentioned pharyngeal residue does not appear to be related to reduced strength or amplitude of structure movement in pharynx, as residue not noted until moved into pharynx from oral cavity. Pt demonstrates complete distension of upper esophageal sphincter, with boluses noted to completely move into esophagus.  Esophageal sweep was unremarkable. Suggest ongoing regular solids with thin liquids, employment of aspiration precautions. Pt may benefit from reducing distractions during meal times d/t complaints of coughing with meals. Pt does have PD, reduced  intent during automatic movements may decrease the efficiency of those motor movements. Pt and caregiver (daughter) verbalize understanding of all recommendations. Pt is scheduled to follow up with this SLP in OP setting. Factors that may increase risk of adverse event in presence of aspiration Denise [name] & Denise [name] 2021): Limited mobility;Reduced cognitive function  Swallow Evaluation Recommendations Recommendations: PO diet PO Diet Recommendation: Regular;Thin liquids (Level 0) Liquid Administration via: Cup Medication Administration: Whole meds with liquid Supervision: Patient able to self-feed Swallowing strategies  : Minimize environmental distractions Oral care recommendations: Oral care BID (2x/day)      Denise [name] 05/25/2023,1:15 PM

## 2023-06-05 NOTE — Therapy (Unsigned)
OUTPATIENT SPEECH LANGUAGE PATHOLOGY PARKINSON'S EVALUATION   Patient Name: Denise [name] MRN: [id number] DOB:12/21/1950, 72 y.o., female Today's Date: 06/07/2023  PCP: Denise Felts FNP REFERRING PROVIDER: Evern Bio Chales Abrahams, MD  END OF SESSION:  End of Session - 06/06/23 1508     Visit Number 1    Number of Visits 13    Date for SLP Re-Evaluation 07/18/23    Authorization Type BCBS Medicare    SLP Start Time 1402    SLP Stop Time  1448    SLP Time Calculation (min) 46 min    Activity Tolerance Patient tolerated treatment well             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   Past Surgical History:  Procedure Laterality Date   TUBAL LIGATION     Patient Active Problem List   Diagnosis Date Noted   Shuffling gait 03/18/2023   Worsened handwriting 03/18/2023   Coordination impairment 03/18/2023   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   Hallucinations 03/13/2023   Influenza vaccination declined 03/13/2023   Need for COVID-19 vaccine 12/30/2022   Fall 11/09/2022   Bradycardia 01/17/2022   Parkinson's disease (HCC) 02/17/2020   Gait abnormality 12/04/2019   Prediabetes 12/03/2018   Vitamin D deficiency 12/03/2018   Anemia due to vitamin B12 deficiency 12/03/2018   Fatigue 12/03/2018   Urinary frequency 10/01/2018   Vitamin B12 deficiency 10/01/2018   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 No  FALLS: Has patient fallen in last 6 months?  No  LIVING ENVIRONMENT: Lives with: lives with their family Lives in: House/apartment  PLOF:  Level of assistance: Needed assistance with ADLs, Needed assistance with IADLS Employment: Retired  PATIENT GOALS: "pinpoint the cognitive and maintain/improve speech"   OBJECTIVE:  Note: Objective measures were completed at Evaluation unless otherwise noted.  COGNITION: Overall cognitive status: Impaired Areas of impairment: Orientation, Attention, Memory, Awareness, and Problem solving Comments: Endorsed decline in cognition resulting in forgetting AD (cane), medications, and appointment times. Experiencing difficulty with recall in conversations resulting in inaccurate responses to questions and need for repeated information, frequent word retrieval difficulties despite use of description strategy   MOTOR SPEECH: Overall motor speech: impaired Level of impairment: Conversation Respiration: thoracic breathing and clavicular breathing Phonation: low vocal intensity Resonance: WFL Articulation: Impaired: conversation Intelligibility: Intelligibility reduced Motor planning: Impaired: inconsistent Interfering components:  PD, cognitive decline Effective technique: slow rate, increased vocal intensity, over articulate, and pacing  ORAL MOTOR EXAMINATION: Overall status: WFL  OBJECTIVE VOICE ASSESSMENT: Conversational loudness average: low 60s dB Voice quality: low vocal intensity and vocal fatigue Stimulability trials: Given SLP modeling and occasional mod cues, loudness average increased to upper 60s dB at sentence level.  Comments:  Despite daily online [employer] [facility], pt exhibits volume decay  averaging low 60s dB conversational volume in quiet environment. With verbal and non-verbal cues, pt able to increase vocal intensity to upper 60s dB.   Pt does report difficulty with swallowing. MBSS completed 05-25-23. See below: INSTRUMENTAL SWALLOW STUDY FINDINGS (MBSS)  [name] presents with functional oropharyngeal swallow. She accepted PO trials of thin, mildly thick, and moderately thick liquids, puree and dysphagia 3 solids. Lingual control was good during directed bolus hold, though during A-P transit some lingual pumping exhibited. Reduced base of tongue retraction also evidenced with narrow column of bolus between base of tongue and posterior pharyngeal wll. All bolus consistencies were noted to leave mild reside in oral cavity with pt moving into pharynx following initial swallow. Pt did not appear sensate to this trace amount of residue, no subsequent swallow to clear noted across exam. Pt's pharyngeal function appears to be within normal limits with complete closure of airway via hyolaryngeal elevation and epiglottic inversion. No penetration or aspiration of any inconsistencies noted. Previously mentioned pharyngeal residue does not appear to be related to reduced strength or amplitude of structure movement in pharynx, as residue not noted until moved into pharynx from oral cavity. Pt demonstrates complete distension of upper esophageal sphincter, with boluses noted to completely move into esophagus. Esophageal sweep was unremarkable. Suggest ongoing regular solids with thin liquids, employment of aspiration precautions. Pt may benefit from reducing distractions during meal times d/t complaints of coughing with meals. Pt does have PD, reduced intent during automatic movements may decrease the efficiency of those motor movements. Pt and caregiver (daughter) verbalize understanding of all recommendations. Pt is scheduled to follow up with this SLP in OP setting.   PATIENT REPORTED OUTCOME  MEASURES (PROM): Memory Mistakes: 16 (N/A for # 3, 13, 16)  TODAY'S TREATMENT:                                                                                                                                         06/06/23: eval only  PATIENT EDUCATION: Education details: see above Person educated: Patient and Child(ren) Education method: Medical illustrator Education comprehension: verbalized understanding, returned demonstration, and needs further education  HOME EXERCISE PROGRAM: Speak Out!   GOALS: Goals reviewed with patient? Yes  SHORT TERM GOALS: Target date: 07/04/2023  Pt will achieve targeted dB levels in demonstration of [employer]! Lessons with 90% accuracy given occasional min A over 2 sessions  Baseline: Goal status: INITIAL  2.  Pt will utilize dysarthria compensations in 5-10 minute conversation and average WNL conversational volume (70-72 dB) given usual min A over 2 sessions  Baseline:  Goal status: INITIAL  3.  Pt and family will carryover memory compensations to aid daily functioning at home given usual min A over 2 sessions  Baseline:  Goal status: INITIAL  4.  Pt will demonstrate recommended swallow  strategies/exercises with occasional mod A over 2 sessions  Baseline:  Goal status: INITIAL   LONG TERM GOALS: Target date: 07/18/2023   Pt will utilize dysarthria compensations in 15+ minute conversation to optimize vocal intensity and clarity given occasional min A over 2 sessions  Baseline:  Goal status: INITIAL  2.  Pt and family will carryover memory compensations to aid daily functioning at home with occasional min A > 1 week Baseline:  Goal status: INITIAL  3.  Pt will utilize intent and recommended strategies to optimize swallow function with less frequent s/sx > 1 week  Baseline:  Goal status: INITIAL  ASSESSMENT:  CLINICAL IMPRESSION: Patient is a 72 y.o. F who was seen today for progression of Parkinson's Disease. Well known  to this clinic from prior ST courses. Recently completed ST screening revealing decline in cognition, increased swallowing difficulty, and low vocal intensity. MBSS completed with functional oropharyngeal swallowing reported but suspect PD impacting inconsistent swallow function. Endorsed intermittent difficulty with solids with daughter tracking troubled foods. Denied difficulty with thin liquids. Continues to complete daily online Spe[employer] Program but demonstrates overall volume decay in conversation averaging low 60s dB. Most concerned with cognitive linguistic decline resulting in frequent word finding, forgetting assistive device around house, reduced recall of medications, and repeating self in conversation. Pt would benefit from short repeat course of speech therapy to optimize communication effectiveness, swallow safety, and cognitive functioning.   OBJECTIVE IMPAIRMENTS: Objective impairments include attention, memory, executive functioning, dysarthria, and dysphagia. These impairments are limiting patient from household responsibilities, effectively communicating at home and in community, and safety when swallowing.Factors affecting potential to achieve goals and functional outcome are medical prognosis and previous level of function.. Patient will benefit from skilled SLP services to address above impairments and improve overall function.  REHAB POTENTIAL: Good  PLAN:  SLP FREQUENCY: 2x/week  SLP DURATION: 6 weeks  PLANNED INTERVENTIONS: 92522- Speech Eval Sound Prod, Articulate, Phonological, 64403 Treatment of swallowing function, 92507 Treatment of speech (30 or 45 min) , Aspiration precaution training, Pharyngeal strengthening exercises, Language facilitation, Cueing hierachy, Cognitive reorganization, Internal/external aids, Functional tasks, Multimodal communication approach, SLP instruction and feedback, Compensatory strategies, and Patient/family education    Gracy Racer, CCC-SLP 06/07/2023, 7:54 AM

## 2023-06-06 ENCOUNTER — Ambulatory Visit: Payer: Medicare Other | Attending: Psychiatry

## 2023-06-06 DIAGNOSIS — R41841 Cognitive communication deficit: Secondary | ICD-10-CM | POA: Insufficient documentation

## 2023-06-06 DIAGNOSIS — R1310 Dysphagia, unspecified: Secondary | ICD-10-CM | POA: Diagnosis not present

## 2023-06-06 DIAGNOSIS — R269 Unspecified abnormalities of gait and mobility: Secondary | ICD-10-CM | POA: Diagnosis not present

## 2023-06-06 DIAGNOSIS — R2689 Other abnormalities of gait and mobility: Secondary | ICD-10-CM | POA: Insufficient documentation

## 2023-06-06 DIAGNOSIS — M6281 Muscle weakness (generalized): Secondary | ICD-10-CM | POA: Diagnosis not present

## 2023-06-06 DIAGNOSIS — R278 Other lack of coordination: Secondary | ICD-10-CM | POA: Diagnosis not present

## 2023-06-06 DIAGNOSIS — R471 Dysarthria and anarthria: Secondary | ICD-10-CM | POA: Diagnosis not present

## 2023-06-06 DIAGNOSIS — R2681 Unsteadiness on feet: Secondary | ICD-10-CM | POA: Diagnosis not present

## 2023-06-08 ENCOUNTER — Encounter: Payer: Self-pay | Admitting: Physical Therapy

## 2023-06-08 ENCOUNTER — Ambulatory Visit: Payer: Medicare Other | Admitting: Physical Therapy

## 2023-06-08 VITALS — BP 116/63 | HR 59

## 2023-06-08 DIAGNOSIS — R278 Other lack of coordination: Secondary | ICD-10-CM | POA: Diagnosis not present

## 2023-06-08 DIAGNOSIS — R2681 Unsteadiness on feet: Secondary | ICD-10-CM | POA: Diagnosis not present

## 2023-06-08 DIAGNOSIS — R1310 Dysphagia, unspecified: Secondary | ICD-10-CM | POA: Diagnosis not present

## 2023-06-08 DIAGNOSIS — R41841 Cognitive communication deficit: Secondary | ICD-10-CM | POA: Diagnosis not present

## 2023-06-08 DIAGNOSIS — M6281 Muscle weakness (generalized): Secondary | ICD-10-CM | POA: Diagnosis not present

## 2023-06-08 DIAGNOSIS — R2689 Other abnormalities of gait and mobility: Secondary | ICD-10-CM

## 2023-06-08 DIAGNOSIS — R471 Dysarthria and anarthria: Secondary | ICD-10-CM | POA: Diagnosis not present

## 2023-06-08 DIAGNOSIS — R269 Unspecified abnormalities of gait and mobility: Secondary | ICD-10-CM

## 2023-06-08 NOTE — Therapy (Signed)
OUTPATIENT PHYSICAL THERAPY NEURO TREATMENT   Patient Name: Denise [name] MRN: [id number] DOB:1950-07-27, 72 y.o., female Today's Date: 06/08/2023   PCP: [name], FNP REFERRING PROVIDER: Evern Bio Chales Abrahams, MD    END OF SESSION:  PT End of Session - 06/08/23 1441     Visit Number 2    Number of Visits 9    Date for PT Re-Evaluation 07/03/23    Authorization Type Blue Cross Blue Shield    PT Start Time [phone]    PT Stop Time 1525    PT Time Calculation (min) 48 min    Equipment Utilized During Treatment Gait belt    Activity Tolerance Treatment limited secondary to medical complications (Comment)   BP drop   Behavior During Therapy [facility] for tasks assessed/performed             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   Past Surgical History:  Procedure Laterality Date   TUBAL LIGATION     Patient Active Problem List   Diagnosis Date Noted   Shuffling gait 03/18/2023   Worsened handwriting 03/18/2023   Coordination impairment 03/18/2023   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   Hallucinations 03/13/2023   Influenza vaccination declined 03/13/2023   Need for COVID-19 vaccine 12/30/2022   Fall 11/09/2022   Bradycardia 01/17/2022   Parkinson's disease (HCC) 02/17/2020   Gait abnormality 12/04/2019   Prediabetes 12/03/2018   Vitamin D deficiency 12/03/2018   Anemia due to vitamin B12 deficiency 12/03/2018   Fatigue 12/03/2018   Urinary frequency 10/01/2018   Vitamin B12 deficiency 10/01/2018   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   Abnormal auditory perception of both ears 05/10/2016   Osteopenia 08/23/2012    ONSET DATE: 05/15/2023 (referral date)  REFERRING DIAG: G20.A1 (ICD-10-CM) - Parkinson's disease without dyskinesia, without mention of  fluctuations  THERAPY DIAG:  Abnormality of gait and mobility  Muscle weakness (generalized)  Unsteadiness on feet  Other lack of coordination  Other abnormalities of gait and mobility  Rationale for Evaluation and Treatment: Rehabilitation  SUBJECTIVE:                                                                                                                                                                                             SUBJECTIVE STATEMENT: Patient provides partial subjective but daughter provides primary subjective.  Patient arrives to session with  rollator. States hallucinations have been about the same. Patient had a swallow study and results do show some difficulty but no precautions to swallowing at this time with water. Denies falls and near falls. Freezing episodes are about the same.    Pt accompanied by: family member daughter  PERTINENT HISTORY: PD with hallucinations, glaucoma, osteoporosis, TB (as a child)    PAIN:  Are you having pain? No  PRECAUTIONS: Fall and Other: Osteoporosis  WEIGHT BEARING RESTRICTIONS: No  FALLS: Has patient fallen in last 6 months? Yes. Number of falls 2  LIVING ENVIRONMENT: Lives with: lives with their family - daughter and granddaughter  Lives in: House/apartment Stairs: Yes: External: 2 steps; none Has following equipment at home: Single point cane and Grab bars  PLOF: Independent, pt's daughter reports sometimes pt needs help with dressing   PATIENT GOALS: See how she was since the last time she was here. Work on the balance   OBJECTIVE:   COGNITION: Overall cognitive status: Impaired   TODAY'S TREATMENT:                                                                                                                               Vitals:   06/08/23 1447 06/08/23 1510 06/08/23 1513 06/08/23 1516  BP: 131/71 115/67 95/65 116/63  Pulse: (!) 57 62 76 (!) 59  SpO2:  97%      Seated on RUE, mildly  elevated but WFL for PT  NMR:  SciFit x 8 min on sprint setting and level 3 for reciprocal large amplitude UE and LE movement pattern, cardiovascular endurance, and neural priming, with target to maintain > 95 spm on off settings and > 80 spm on on settings (mild difficulty achieving tragets with fatigue on off periods around minute 6)  RPE: 5/10 Discussed results  TherAct: Assessed vitals as noted above Goal assessment not captured on eval as noted below   [facility] PT Assessment - 06/08/23 0001       Mini-BESTest   Sit To Stand Moderate: Comes to stand WITH use of hands on first attempt.   see eval   Rise to Toes Moderate: Heels up, but not full range (smaller than when holding hands), OR noticeable instability for 3 s.   30% of height as when using hands for stability, increaed unsteadiness   Stand on one leg (left) Moderate: < 20 s   2 seconds   Stand on one leg (right) Moderate: < 20 s   2 seconds   Stand on one leg - lowest score 1    Compensatory Stepping Correction - Forward Moderate: More than one step is required to recover equilibrium   see eval   Compensatory Stepping Correction - Backward Moderate: More than one step is required to recover equilibrium   see eval   Compensatory Stepping Correction - Left Lateral Moderate: Several steps to recover equilibrium   see eval   Compensatory Stepping Correction -  Right Lateral Moderate: Several steps to recover equilibrium   see eval   Stepping Corredtion Lateral - lowest score 1    Stance - Feet together, eyes open, firm surface  Moderate: < 30s    Timed UP & GO with Dual Task Severe: Stops counting while walking OR stops walking while counting.   see eval           After completing the static standing in // bars with eyes open, patient reports feeling SOB. Second therapist called over to get chair and pulse ox. Patient lowered to sitting. SPO2 97-99% when assessed, BP dropped from 130/70s to 115/60s when assessed. Rechecked in  standing and dropped to 90/60s. Patient already in compression socks, discussed trial of abdominal binder in future sessions and encouraged follow up with PCP. Held further PT and transferred patient to transport chair to leave clinic safely in today's session.   PATIENT EDUCATION: Education details: BP management Person educated: Patient and daughter, granddaughter  Education method: Explanation, Demonstration, and Verbal cues Education comprehension: verbalized understanding, returned demonstration, and needs further education  HOME EXERCISE PROGRAM: Copied over from previous bout of therapy: Will review/updated as needed; recommend checking out.   Standing and seated PWR, Pt has in her exercise folder.     Access Code: [email or web address] URL: [email or web address] Date: 10/19/2021 Prepared by: [name]   Reviewed/condensed exercises for balance:    Exercises - Alternating Step Backward with Support  - 1 x daily - 5 x weekly - 1-2 sets - 10 reps - Romberg Stance Eyes Closed on Foam Pad  - 1 x daily - 5 x weekly - 3 sets - 30 hold   Plus standing tall against the wall for posture - 2 x 30 second holds    GOALS: Goals reviewed with patient? Yes  SHORT TERM GOALS: STG = LTG due to POC length  LONG TERM GOALS: Target date: 07/03/2023  Pt will be independent with final PD HEP for improved strength, balance, transfers, and gait  Baseline: To be provided/reviewed from last session Goal status: INITIAL  2.  miniBEST to be completed with LTG written.  Baseline: To be completed Goal status: INITIAL  3.  Pt will improve gait speed with LRAD to at least 2.71 ft/sec in order to demo improved community mobility.   Baseline: 2.41 ft/sec with rollator Goal status: INITIAL  4.  Patient will improve TUG score to 35 seconds with LRAD to indicate a decreased risk of falls and demonstrate improved overall mobility.   Baseline: 82 to 40.91 seconds with range due to freezing  episodes and rollator with SBA-minA  Goal status: INITIAL  5.  Pt will improve 5x sit<>stand to less than or equal to 12 sec to demonstrate improved functional strength and transfer efficiency. Baseline: 13.2 seconds with no UE support Goal status: INITIAL  ASSESSMENT:  CLINICAL IMPRESSION: Skilled PT session emphasized work on SciFit for neural priming, large amplitude movements, and cardiovascular endurance. Session limited by drop in BP by 40s points for systolic and 10 points for diastolic with standing during miniBEST. Increased to safe range for transfer out of clinic transport chair. Educated on BP safety and encouraged follow up with PCP. Discussed abdominal binder trial. Continue POC as able.   OBJECTIVE IMPAIRMENTS: Abnormal gait, decreased activity tolerance, decreased balance, decreased cognition, decreased coordination, decreased knowledge of use of DME, decreased mobility, difficulty walking, decreased strength, decreased safety awareness, impaired flexibility, impaired tone, and postural dysfunction.   ACTIVITY LIMITATIONS: carrying, lifting, bending,  stairs, transfers, dressing, and locomotion level  PARTICIPATION LIMITATIONS: meal prep, cleaning, driving, shopping, and community activity  PERSONAL FACTORS: Age, Behavior pattern, Past/current experiences, Time since onset of injury/illness/exacerbation, and 3+ comorbidities: PD, glaucoma, osteoporosis (newly diagnosed), TB (as a child)    are also affecting patient's functional outcome.   REHAB POTENTIAL: Good  CLINICAL DECISION MAKING: Evolving/moderate complexity  EVALUATION COMPLEXITY: Moderate  PLAN:  PT FREQUENCY: 2x/week  PT DURATION: 4 weeks  PLANNED INTERVENTIONS: Therapeutic exercises, Therapeutic activity, Neuromuscular re-education, Balance training, Gait training, Patient/Family education, Self Care, Stair training, Vestibular training, Visual/preceptual remediation/compensation, DME instructions, Manual  therapy, and Re-evaluation  PLAN FOR NEXT SESSION: Finish miniBEST and update goal as able, monitor BP and possible abdominal measuring and recommendations, discuss water and food intake Review HEP and update as needed. Work on turning before sitting down and postural awareness, work on stepping reaction and weight shift   Carmelia Bake, PT, DPT  06/08/2023, 3:41 PM

## 2023-06-09 DIAGNOSIS — R42 Dizziness and giddiness: Secondary | ICD-10-CM | POA: Diagnosis not present

## 2023-06-09 DIAGNOSIS — Z7983 Long term (current) use of bisphosphonates: Secondary | ICD-10-CM | POA: Diagnosis not present

## 2023-06-09 DIAGNOSIS — R0789 Other chest pain: Secondary | ICD-10-CM | POA: Diagnosis not present

## 2023-06-09 DIAGNOSIS — Z79899 Other long term (current) drug therapy: Secondary | ICD-10-CM | POA: Diagnosis not present

## 2023-06-09 DIAGNOSIS — R079 Chest pain, unspecified: Secondary | ICD-10-CM | POA: Diagnosis not present

## 2023-06-09 DIAGNOSIS — I951 Orthostatic hypotension: Secondary | ICD-10-CM | POA: Diagnosis not present

## 2023-06-12 ENCOUNTER — Ambulatory Visit: Payer: Medicare Other | Admitting: Physical Therapy

## 2023-06-12 ENCOUNTER — Encounter: Payer: Self-pay | Admitting: Physical Therapy

## 2023-06-12 VITALS — BP 113/70 | HR 62

## 2023-06-12 DIAGNOSIS — R41841 Cognitive communication deficit: Secondary | ICD-10-CM | POA: Diagnosis not present

## 2023-06-12 DIAGNOSIS — R2681 Unsteadiness on feet: Secondary | ICD-10-CM

## 2023-06-12 DIAGNOSIS — M6281 Muscle weakness (generalized): Secondary | ICD-10-CM

## 2023-06-12 DIAGNOSIS — R2689 Other abnormalities of gait and mobility: Secondary | ICD-10-CM | POA: Diagnosis not present

## 2023-06-12 DIAGNOSIS — R278 Other lack of coordination: Secondary | ICD-10-CM | POA: Diagnosis not present

## 2023-06-12 DIAGNOSIS — R269 Unspecified abnormalities of gait and mobility: Secondary | ICD-10-CM | POA: Diagnosis not present

## 2023-06-12 DIAGNOSIS — R1310 Dysphagia, unspecified: Secondary | ICD-10-CM | POA: Diagnosis not present

## 2023-06-12 DIAGNOSIS — R471 Dysarthria and anarthria: Secondary | ICD-10-CM | POA: Diagnosis not present

## 2023-06-12 NOTE — Therapy (Signed)
OUTPATIENT PHYSICAL THERAPY NEURO TREATMENT   Patient Name: Denise [name] MRN: [id number] DOB:01/08/1951, 72 y.o., female Today's Date: 06/12/2023   PCP: [name], FNP REFERRING PROVIDER: Evern Bio Chales Abrahams, MD    END OF SESSION:  PT End of Session - 06/12/23 1233     Visit Number 3    Number of Visits 9    Date for PT Re-Evaluation 07/03/23    Authorization Type Blue Cross Blue Shield    PT Start Time 1230    PT Stop Time 1311    PT Time Calculation (min) 41 min    Equipment Utilized During Treatment Gait belt    Activity Tolerance Patient tolerated treatment well    Behavior During Therapy [facility] for tasks assessed/performed             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   Past Surgical History:  Procedure Laterality Date   TUBAL LIGATION     Patient Active Problem List   Diagnosis Date Noted   Shuffling gait 03/18/2023   Worsened handwriting 03/18/2023   Coordination impairment 03/18/2023   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   Hallucinations 03/13/2023   Influenza vaccination declined 03/13/2023   Need for COVID-19 vaccine 12/30/2022   Fall 11/09/2022   Bradycardia 01/17/2022   Parkinson's disease (HCC) 02/17/2020   Gait abnormality 12/04/2019   Prediabetes 12/03/2018   Vitamin D deficiency 12/03/2018   Anemia due to vitamin B12 deficiency 12/03/2018   Fatigue 12/03/2018   Urinary frequency 10/01/2018   Vitamin B12 deficiency 10/01/2018   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   Abnormal auditory perception of both ears 05/10/2016   Osteopenia 08/23/2012    ONSET DATE: 05/15/2023 (referral date)  REFERRING DIAG: G20.A1 (ICD-10-CM) - Parkinson's disease without dyskinesia, without mention of fluctuations  THERAPY DIAG:  Abnormality of gait and  mobility  Muscle weakness (generalized)  Unsteadiness on feet  Rationale for Evaluation and Treatment: Rehabilitation  SUBJECTIVE:                                                                                                                                                                                             SUBJECTIVE STATEMENT: Went to the ER since she was last here for orthostatic hypotension and chest wall pain. Tests were run and it was negative and was educated to follow up with her PCP regarding  orthostatics. No falls.   Pt accompanied by: family member daughter  PERTINENT HISTORY: PD with hallucinations, glaucoma, osteoporosis, TB (as a child)    PAIN:  Are you having pain? No  PRECAUTIONS: Fall and Other: Osteoporosis  WEIGHT BEARING RESTRICTIONS: No  FALLS: Has patient fallen in last 6 months? Yes. Number of falls 2  LIVING ENVIRONMENT: Lives with: lives with their family - daughter and granddaughter  Lives in: House/apartment Stairs: Yes: External: 2 steps; none Has following equipment at home: Single point cane and Grab bars  PLOF: Independent, pt's daughter reports sometimes pt needs help with dressing   PATIENT GOALS: See how she was since the last time she was here. Work on the balance   OBJECTIVE:   COGNITION: Overall cognitive status: Impaired   TODAY'S TREATMENT:                                                                                                                                TherAct:  Vitals:   06/12/23 1241 06/12/23 1242  BP: 119/68 113/70  Pulse: (!) 54 62  Sitting, Standing   Assessed vitals as noted above, no orthostatics noted Reviewed strategies for orthostatics: staying well hydrated, taking time with transitions, doing ankle pumps/marching before standing. Pt to follow-up with this with her PCP and educated to continue to monitor BP at home.    NMR:  Pt performs PWR! Moves in Standing position:    PWR! Up for  improved posture 10 reps, an additional 10 reps with raising onto toes for anterior balance, initial cues for wider BOS   PWR! Rock for improved weight shifting 10 reps, performed an additional 10 reps with lifting up opposite leg for dynamic SLS, pt needing to hold onto chair   PWR! Twist for improved trunk rotation 10 reps   PWR! Step for improved step initiation 10 reps, plus an additional 10 reps with stepping over 2" obstacle for improved foot clearance    On blue foam beam: Sit <> stands from mat table without UE support, 5 reps with cues for incr forward lean to stand, when standing cues for scap retraction and looking up to the ceiling, pt with incr unsteadiness/postural sway when looking up with more upright posture, needing CGA for balance  Alternating lateral side steps for stepping strategy with cues for trying to look straight ahead, 10 reps each side, pt more unsteady when stepping to the L side and back to midline  10 reps sit <> stands with 4# medicine ball, when standing raising it over head and slamming it to floor and catching it, pt did well with large amplitude when tossing ball down and catching it    PATIENT EDUCATION: Education details: BP management and checking it at home, reviewed standing PWR moves from HEP with pt performing well  Person educated: Patient and daughter  Education method: Explanation, Demonstration, and Verbal cues Education comprehension: verbalized understanding, returned demonstration, and needs  further education  HOME EXERCISE PROGRAM: Copied over from previous bout of therapy: Will review/updated as needed; recommend checking out.   Standing and seated PWR, Pt has in her exercise folder.     Access Code: [email or web address] URL: [email or web address] Date: 10/19/2021 Prepared by: [name]   Reviewed/condensed exercises for balance:    Exercises - Alternating Step Backward with Support  - 1 x daily - 5 x weekly - 1-2 sets -  10 reps - Romberg Stance Eyes Closed on Foam Pad  - 1 x daily - 5 x weekly - 3 sets - 30 hold   Plus standing tall against the wall for posture - 2 x 30 second holds    GOALS: Goals reviewed with patient? Yes  SHORT TERM GOALS: STG = LTG due to POC length  LONG TERM GOALS: Target date: 07/03/2023  Pt will be independent with final PD HEP for improved strength, balance, transfers, and gait  Baseline: To be provided/reviewed from last session Goal status: INITIAL  2.  miniBEST to be completed with LTG written.  Baseline: To be completed Goal status: INITIAL  3.  Pt will improve gait speed with LRAD to at least 2.71 ft/sec in order to demo improved community mobility.   Baseline: 2.41 ft/sec with rollator Goal status: INITIAL  4.  Patient will improve TUG score to 35 seconds with LRAD to indicate a decreased risk of falls and demonstrate improved overall mobility.   Baseline: 82 to 40.91 seconds with range due to freezing episodes and rollator with SBA-minA  Goal status: INITIAL  5.  Pt will improve 5x sit<>stand to less than or equal to 12 sec to demonstrate improved functional strength and transfer efficiency. Baseline: 13.2 seconds with no UE support Goal status: INITIAL  ASSESSMENT:  CLINICAL IMPRESSION:  Pt's BP WNL for therapy today and did not demonstrate any orthostatic values. Pt tolerated standing exercises well with no reports of any lightheadedness or dizziness. Reviewed standing PWR moves for HEP with pt performing well, needing intermittent cues for looking at hands, elbow extension, and opening up hands. Remainder of session focused on sit <> stands and lateral stepping. When performing standing on an unlevel surface, pt more challenged with balance and had more unsteadiness with more upright posture/looking up. Will continue per POC.    OBJECTIVE IMPAIRMENTS: Abnormal gait, decreased activity tolerance, decreased balance, decreased cognition, decreased  coordination, decreased knowledge of use of DME, decreased mobility, difficulty walking, decreased strength, decreased safety awareness, impaired flexibility, impaired tone, and postural dysfunction.   ACTIVITY LIMITATIONS: carrying, lifting, bending, stairs, transfers, dressing, and locomotion level  PARTICIPATION LIMITATIONS: meal prep, cleaning, driving, shopping, and community activity  PERSONAL FACTORS: Age, Behavior pattern, Past/current experiences, Time since onset of injury/illness/exacerbation, and 3+ comorbidities: PD, glaucoma, osteoporosis (newly diagnosed), TB (as a child)    are also affecting patient's functional outcome.   REHAB POTENTIAL: Good  CLINICAL DECISION MAKING: Evolving/moderate complexity  EVALUATION COMPLEXITY: Moderate  PLAN:  PT FREQUENCY: 2x/week  PT DURATION: 4 weeks  PLANNED INTERVENTIONS: Therapeutic exercises, Therapeutic activity, Neuromuscular re-education, Balance training, Gait training, Patient/Family education, Self Care, Stair training, Vestibular training, Visual/preceptual remediation/compensation, DME instructions, Manual therapy, and Re-evaluation  PLAN FOR NEXT SESSION: Finish miniBEST and update goal as able, monitor BP. Review HEP and update as needed. Work on turning before sitting down and postural awareness, work on stepping reaction and weight shift   Work on bed mobility. Review seated PWR moves   Drake Leach, PT, DPT  06/12/2023,  3:56 PM

## 2023-06-13 ENCOUNTER — Telehealth: Payer: Self-pay | Admitting: Neurology

## 2023-06-13 ENCOUNTER — Telehealth: Payer: Self-pay

## 2023-06-13 NOTE — Transitions of Care (Post Inpatient/ED Visit) (Signed)
06/13/2023  Name: Denise [name] MRN: [id number] DOB: May 18, 1951  Today's TOC FU Call Status: Today's TOC FU Call Status:: Successful TOC FU Call Completed TOC FU Call Complete Date: 06/13/23 Patient's Name and Date of Birth confirmed.  Transition Care Management Follow-up Telephone Call Date of Discharge: 06/09/23 Discharge Facility: Other [facility]) Name of Other ([facility]) Discharge Facility: [facility] Type of Discharge: Inpatient Admission Primary Inpatient Discharge Diagnosis:: Hypotension How have you been since you were released from the hospital?: Same Any questions or concerns?: No  Items Reviewed: Did you receive and understand the discharge instructions provided?: Yes Medications obtained,verified, and reconciled?: Yes (Medications Reviewed) Any new allergies since your discharge?: No Dietary orders reviewed?: No Do you have support at home?: Yes People in Home: child(ren), adult  Medications Reviewed Today: Medications Reviewed Today     Reviewed by Marlyn Corporal, CMA (Certified Medical Assistant) on 06/13/23 at 1717  Med List Status: <None>   Medication Order Taking? Sig Documenting Provider Last Dose Status Informant  alendronate (FOSAMAX) 70 MG tablet [id number] Yes TAKE 1 TABLET (70 MG TOTAL) BY MOUTH EVERY 7 DAYS WITH FULL GLASS WATER ON EMPTY STOMACH [name], FNP Taking Active   atorvastatin (LIPITOR) 10 MG tablet [id number] Yes TAKE 1 TABLET BY MOUTH EVERY DAY AT BEDTIME MONDAY THROUGH Elpidio Galea, FNP Taking Active   Calcium Carb-Cholecalciferol (OYSTER SHELL CALCIUM/VITAMIN D) 500-200 MG-UNIT PACK [id number] Yes Take by mouth. [provider] Taking Active   carbidopa-levodopa (SINEMET IR) 25-100 MG tablet [id number] Yes Take at 6am,12 and 6pm. Try to avoid protein. May take an extra during the day if needed.  Patient taking differently: 0.5 tablets. Take at 6am,12 and 6pm. Try to avoid protein. May take an extra during the day if  needed.   Anson Fret, MD Taking Active   dorzolamide-timolol (COSOPT) 2-0.5 % ophthalmic solution [id number] Yes Place 1 drop into the left eye 2 (two) times daily. [provider] Taking Active   hydrochlorothiazide (HYDRODIURIL) 12.5 MG tablet [id number] Yes TAKE 1 TABLET BY MOUTH EVERY DAY AS NEEDED FOR LEG SWELLING AS DIRECTED [name], FNP Taking Active   ibuprofen (ADVIL) 800 MG tablet [id number] Yes Take 800 mg by mouth every 8 (eight) hours as needed. [provider] Taking Active   QUEtiapine (SEROQUEL) 25 MG tablet [id number] Yes Take 1 tablet (25 mg total) by mouth at bedtime. Anson Fret, MD Taking Active   sulfamethoxazole-trimethoprim (BACTRIM DS) 800-160 MG tablet [id number] Yes Take 1 tablet by mouth 2 (two) times daily. [name], FNP Taking Active   Travoprost, BAK Free, (TRAVATAN) 0.004 % SOLN ophthalmic solution [id number] Yes 1 drop at bedtime. [provider] Taking Active   Vitamin D, Ergocalciferol, (DRISDOL) 1.25 MG (50000 UNIT) CAPS capsule [id number] Yes TAKE 1 CAPSULE (50,000 UNITS TOTAL) BY MOUTH EVERY 7 (SEVEN) DAYS [name], FNP Taking Active             Home Care and Equipment/Supplies: Were Home Health Services Ordered?: No Any new equipment or medical supplies ordered?: No  Functional Questionnaire: Do you need assistance with bathing/showering or dressing?: No Do you need assistance with meal preparation?: Yes Do you need assistance with eating?: No Do you have difficulty maintaining continence: No Do you need assistance with getting out of bed/getting out of a chair/moving?: No Do you have difficulty managing or taking your medications?: Yes  Follow up appointments reviewed: PCP Follow-up appointment confirmed?: Yes Date of PCP follow-up appointment?: 06/19/23 Follow-up Provider: Christell Constant  [facility] Follow-up appointment confirmed?: No Reason Specialist Follow-Up Not Confirmed: Appointment  Sceduled by [facility] Calling Clinician Do you need transportation to your follow-up appointment?: No Do you understand care options if your condition(s) worsen?: Yes-patient verbalized understanding    SIGNATURE Lisabeth Devoid, CMA

## 2023-06-13 NOTE — Telephone Encounter (Signed)
I called daughter of pt.  (Who has PD) she is on seroquel 25mg  po at bedtime for hallucination/ delusions.  Seemed to help initially, she has now show worsening paranoia, delusions/hallucination.  Refusing to eat.  Pt feels that daughter and grand-daughter is against her.  She was seen 06-09-2023 orthostasis/ labs done.  No infection per daughter.  Asking for increase of seroquel would help?  We have no folllow up, noted to see [facility] neuro, but daughter said Dr. Lucia Gaskins would follow for this.

## 2023-06-13 NOTE — Telephone Encounter (Signed)
Pt's daughter hallucination getting worse and delusional. She thinks her 71 year old granddaughter is against  Getting challenging to get her to eat.  QUEtiapine (SEROQUEL) 25 MG tablet at the current dose is not working.Her medication may need to be increase. Maybe can get a work in or a earlier appt than 11/2023. Would like a call back.

## 2023-06-14 NOTE — Telephone Encounter (Signed)
We can increase it to 25mg  twice a day if daughter approved please order, 180 for 3 months and 3 refills thanks

## 2023-06-15 ENCOUNTER — Encounter (HOSPITAL_COMMUNITY): Payer: Self-pay

## 2023-06-15 ENCOUNTER — Encounter: Payer: Self-pay | Admitting: Physical Therapy

## 2023-06-15 ENCOUNTER — Telehealth: Payer: Self-pay

## 2023-06-15 ENCOUNTER — Ambulatory Visit: Payer: Medicare Other | Admitting: Physical Therapy

## 2023-06-15 ENCOUNTER — Ambulatory Visit: Payer: Medicare Other | Admitting: Speech Pathology

## 2023-06-15 ENCOUNTER — Ambulatory Visit (HOSPITAL_COMMUNITY)
Admission: EM | Admit: 2023-06-15 | Discharge: 2023-06-15 | Disposition: A | Discharge status: Home / Self Care | Payer: Medicare Other | Attending: Internal Medicine | Admitting: Internal Medicine

## 2023-06-15 ENCOUNTER — Telehealth: Payer: Self-pay | Admitting: Physical Therapy

## 2023-06-15 ENCOUNTER — Telehealth: Payer: Self-pay | Admitting: Neurology

## 2023-06-15 VITALS — BP 146/73 | HR 51

## 2023-06-15 DIAGNOSIS — R278 Other lack of coordination: Secondary | ICD-10-CM

## 2023-06-15 DIAGNOSIS — N3001 Acute cystitis with hematuria: Secondary | ICD-10-CM

## 2023-06-15 DIAGNOSIS — R2689 Other abnormalities of gait and mobility: Secondary | ICD-10-CM | POA: Diagnosis not present

## 2023-06-15 DIAGNOSIS — R2681 Unsteadiness on feet: Secondary | ICD-10-CM | POA: Diagnosis not present

## 2023-06-15 DIAGNOSIS — R269 Unspecified abnormalities of gait and mobility: Secondary | ICD-10-CM

## 2023-06-15 DIAGNOSIS — M6281 Muscle weakness (generalized): Secondary | ICD-10-CM

## 2023-06-15 DIAGNOSIS — R471 Dysarthria and anarthria: Secondary | ICD-10-CM | POA: Diagnosis not present

## 2023-06-15 DIAGNOSIS — R41841 Cognitive communication deficit: Secondary | ICD-10-CM | POA: Diagnosis not present

## 2023-06-15 DIAGNOSIS — R1310 Dysphagia, unspecified: Secondary | ICD-10-CM | POA: Diagnosis not present

## 2023-06-15 LAB — POCT URINALYSIS DIP (MANUAL ENTRY)
Bilirubin, UA: NEGATIVE
Glucose, UA: NEGATIVE mg/dL
Ketones, POC UA: NEGATIVE mg/dL
Nitrite, UA: NEGATIVE
Protein Ur, POC: NEGATIVE mg/dL
Spec Grav, UA: 1.01 (ref 1.010–1.025)
Urobilinogen, UA: 0.2 U/dL
pH, UA: 7 (ref 5.0–8.0)

## 2023-06-15 MED ORDER — SULFAMETHOXAZOLE-TRIMETHOPRIM 800-160 MG PO TABS: 1.0000 | ORAL_TABLET | Freq: Two times a day (BID) | ORAL | 0 refills | Status: AC

## 2023-06-15 MED ORDER — QUETIAPINE FUMARATE 25 MG PO TABS: 25.0000 mg | ORAL_TABLET | Freq: Two times a day (BID) | ORAL | 3 refills | Status: DC

## 2023-06-15 NOTE — Addendum Note (Signed)
Addended by: Guy Begin on: 06/15/2023 10:41 AM   Modules accepted: Orders

## 2023-06-15 NOTE — Telephone Encounter (Signed)
I called and spoke to daughter of pt.  I relayed that she can give additional dose of seroquel 25mg  po, start taking BID. Order to pharmacy.  I relayed that she contact pcp for appt to evaluate for any underlying infection /UTI, since not check when in ED that I can see.  She is not wanting to eat/drink or take her medications (sinemet).  I relayed that if she needs urgent needs then to call 911 or take her to ED for eval.  She appreciated call back.  Will give her dose this am.  Will call back as needed.

## 2023-06-15 NOTE — Discharge Instructions (Addendum)
Please increase oral fluid intake Your urine test is suggestive of urinary tract infection Please take antibiotics as prescribed Will call you with recommendations if we need to change your antibiotics. You may take Tylenol as needed for fever Please feel free to return to urgent care if symptoms persist or worsens.

## 2023-06-15 NOTE — ED Triage Notes (Signed)
Patient needing urinary testing. Was at the ED last week and urine was not ran. Patient has some confusion with history of Parkinson's.

## 2023-06-15 NOTE — Telephone Encounter (Signed)
Daughter calling for response from Dr Lucia Gaskins

## 2023-06-15 NOTE — Telephone Encounter (Signed)
Patients daughter - Yeymi Crosslin left message that patient was getting worse. [name] was called back but did not answer- VM was left stating she should go to her already scheduled neuro appointment today and they can do a simple evaluation.

## 2023-06-15 NOTE — ED Provider Notes (Signed)
[facility]    CSN: [id number] Arrival date & time: 06/15/23  1540      History   Chief Complaint Chief Complaint  Patient presents with   Altered Mental Status    HPI Denise [name] is a 72 y.o. female is brought to the urgent care accompanied by family member on account of mild confusion over the past several days.  Patient's family member has noticed increased urinary frequency.  Patient denies any dysuria or lower abdominal pain.  No nausea, vomiting, chills or fever.  No flank pain.  The confusion is intermittent especially in the mornings.  Patient has a history of Parkinson's disease.  No changes to her chronic medications.   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    Patient Active Problem List   Diagnosis Date Noted   Shuffling gait 03/18/2023   Worsened handwriting 03/18/2023   Coordination impairment 03/18/2023   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   Hallucinations 03/13/2023   Influenza vaccination declined 03/13/2023   Need for COVID-19 vaccine 12/30/2022   Fall 11/09/2022   Bradycardia 01/17/2022   Parkinson's disease (HCC) 02/17/2020   Gait abnormality 12/04/2019   Prediabetes 12/03/2018   Vitamin D deficiency 12/03/2018   Anemia due to vitamin B12 deficiency 12/03/2018   Fatigue 12/03/2018   Urinary frequency 10/01/2018   Vitamin B12 deficiency 10/01/2018   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 Authorizing Provider  alendronate (FOSAMAX) 70 MG tablet TAKE 1 TABLET (70 MG TOTAL) BY MOUTH EVERY 7 DAYS WITH FULL GLASS WATER ON EMPTY STOMACH 04/05/23  Yes [name], FNP  atorvastatin (LIPITOR) 10 MG tablet TAKE 1 TABLET BY MOUTH EVERY DAY AT BEDTIME MONDAY THROUGH FRIDAY 07/26/22  Yes [name], FNP  Calcium Carb-Cholecalciferol (OYSTER SHELL CALCIUM/VITAMIN D) 500-200 MG-UNIT PACK Take by mouth.   Yes [provider]  carbidopa-levodopa (SINEMET IR) 25-100 MG tablet Take at 6am,12 and 6pm. Try to avoid protein. May take an extra during the day if needed. Patient taking differently: 0.5 tablets. Take at 6am,12 and 6pm. Try to avoid protein. May take an extra during the day if needed. 12/27/21  Yes Anson Fret, MD  dorzolamide-timolol (COSOPT) 2-0.5 % ophthalmic solution Place 1 drop into the left eye 2 (two) times daily. 03/08/22  Yes [provider]  hydrochlorothiazide (HYDRODIURIL) 12.5 MG tablet TAKE 1 TABLET BY MOUTH EVERY DAY AS NEEDED FOR LEG SWELLING AS DIRECTED 03/21/23  Yes [name], FNP  ibuprofen (ADVIL) 800  MG tablet Take 800 mg by mouth every 8 (eight) hours as needed. 03/25/22  Yes [provider]  QUEtiapine (SEROQUEL) 25 MG tablet Take 1 tablet (25 mg total) by mouth 2 (two) times daily. 06/15/23  Yes Anson Fret, MD  sulfamethoxazole-trimethoprim (BACTRIM DS) 800-160 MG tablet Take 1 tablet by mouth 2 (two) times daily for 3 days. 06/15/23 06/18/23 Yes Zabian Swayne, Britta Mccreedy, MD  Travoprost, BAK Free, (TRAVATAN) 0.004 % SOLN ophthalmic solution 1 drop at bedtime.   Yes [provider]  Vitamin D, Ergocalciferol, (DRISDOL) 1.25 MG (50000 UNIT) CAPS capsule TAKE 1 CAPSULE (50,000 UNITS TOTAL) BY MOUTH EVERY 7 (SEVEN) DAYS 04/05/23  Yes [name], FNP    Family History Family History  Problem Relation Age of Onset   Hypertension Mother     Heart disease Mother    Diabetes Father    Hypertension Father    Other Father        had a condition that was "a cousin" to ALS    Diabetes Sister    Tuberculosis Maternal Grandmother    Heart disease Sister    Colitis Daughter    Colon cancer Neg Hx    Colon polyps Neg Hx    Esophageal cancer Neg Hx    Rectal cancer Neg Hx    Stomach cancer Neg Hx     Social History Social History   Tobacco Use   Smoking status: Never   Smokeless tobacco: Never  Vaping Use   Vaping status: Never Used  Substance Use Topics   Alcohol use: Never   Drug use: No     Allergies   Nystatin-triamcinolone, Triamcinolone acetonide, and Cephalexin   Review of Systems Review of Systems As per HPI  Physical Exam Triage Vital Signs ED Triage Vitals  Encounter Vitals Group     BP 06/15/23 1627 (!) 181/84     Systolic BP Percentile --      Diastolic BP Percentile --      Pulse Rate 06/15/23 1627 (!) 57     Resp 06/15/23 1627 16     Temp 06/15/23 1627 98.6 F (37 C)     Temp Source 06/15/23 1627 Oral     SpO2 06/15/23 1627 94 %     Weight 06/15/23 1627 121 lb 14.6 oz (55.3 kg)     Height 06/15/23 1627 5\' 6"  (1.676 m)     Head Circumference --      Peak Flow --      Pain Score 06/15/23 1626 0     Pain Loc --      Pain Education --      Exclude from Growth Chart --    No data found.  Updated Vital Signs BP (!) 181/84 (BP Location: Left Arm)   Pulse (!) 57   Temp 98.6 F (37 C) (Oral)   Resp 16   Ht 5\' 6"  (1.676 m)   Wt 55.3 kg   SpO2 94%   BMI 19.68 kg/m   Visual Acuity Right Eye Distance:   Left Eye Distance:   Bilateral Distance:    Right Eye Near:   Left Eye Near:    Bilateral Near:     Physical Exam Vitals and nursing note reviewed.  Constitutional:      General: She is not in acute distress.    Appearance: She is not ill-appearing.  Cardiovascular:     Rate and Rhythm: Normal rate and regular rhythm.  Pulmonary:  Effort: Pulmonary effort is normal.      Breath sounds: Normal breath sounds.  Abdominal:     General: Bowel sounds are normal.     Palpations: Abdomen is soft.  Neurological:     Mental Status: She is alert.      UC Treatments / Results  Labs (all labs ordered are listed, but only abnormal results are displayed) Labs Reviewed  POCT URINALYSIS DIP (MANUAL ENTRY) - Abnormal; Notable for the following components:      Result Value   Blood, UA trace-intact (*)    Leukocytes, UA Trace (*)    All other components within normal limits  URINE CULTURE    EKG   Radiology No results found.  Procedures Procedures (including critical care time)  Medications Ordered in UC Medications - No data to display  Initial Impression / Assessment and Plan / UC Course  I have reviewed the triage vital signs and the nursing notes.  Pertinent labs & imaging results that were available during my care of the patient were reviewed by me and considered in my medical decision making (see chart for details).     1.  Acute cystitis with hematuria: Point-of-care urinalysis is positive for leukocyte Estrace and hemoglobin Patient is advised to increase oral fluid intake Urine cultures have been sent Return precautions given. Final Clinical Impressions(s) / UC Diagnoses   Final diagnoses:  Acute cystitis with hematuria     Discharge Instructions      Please increase oral fluid intake Your urine test is suggestive of urinary tract infection Please take antibiotics as prescribed Will call you with recommendations if we need to change your antibiotics. You may take Tylenol as needed for fever Please feel free to return to urgent care if symptoms persist or worsens.   ED Prescriptions     Medication Sig Dispense Auth. Provider   sulfamethoxazole-trimethoprim (BACTRIM DS) 800-160 MG tablet Take 1 tablet by mouth 2 (two) times daily for 3 days. 6 tablet Roopa Graver, Britta Mccreedy, MD      PDMP not reviewed this encounter.    Merrilee Jansky, MD 06/15/23 1740

## 2023-06-15 NOTE — Therapy (Signed)
OUTPATIENT PHYSICAL THERAPY NEURO TREATMENT   Patient Name: Denise [name] MRN: [id number] DOB:Feb 27, 1951, 72 y.o., female Today's Date: 06/15/2023   PCP: [name], FNP REFERRING PROVIDER: Evern Bio Chales Abrahams, MD    END OF SESSION:  PT End of Session - 06/15/23 1643     Visit Number 4    Number of Visits 9    Date for PT Re-Evaluation 07/03/23    Authorization Type [employer]    PT Start Time 1430    PT Stop Time 1510    PT Time Calculation (min) 40 min    Equipment Utilized During Treatment Gait belt    Activity Tolerance Treatment limited secondary to medical complications (Comment)    Behavior During Therapy Flat affect;Agitated             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   Past Surgical History:  Procedure Laterality Date   TUBAL LIGATION     Patient Active Problem List   Diagnosis Date Noted   Shuffling gait 03/18/2023   Worsened handwriting 03/18/2023   Coordination impairment 03/18/2023   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   Hallucinations 03/13/2023   Influenza vaccination declined 03/13/2023   Need for COVID-19 vaccine 12/30/2022   Fall 11/09/2022   Bradycardia 01/17/2022   Parkinson's disease (HCC) 02/17/2020   Gait abnormality 12/04/2019   Prediabetes 12/03/2018   Vitamin D deficiency 12/03/2018   Anemia due to vitamin B12 deficiency 12/03/2018   Fatigue 12/03/2018   Urinary frequency 10/01/2018   Vitamin B12 deficiency 10/01/2018   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   Abnormal auditory perception of both ears 05/10/2016   Osteopenia 08/23/2012    ONSET DATE: 05/15/2023 (referral date)  REFERRING DIAG: G20.A1 (ICD-10-CM) - Parkinson's disease without dyskinesia, without mention of fluctuations  THERAPY DIAG:   Abnormality of gait and mobility  Muscle weakness (generalized)  Unsteadiness on feet  Other lack of coordination  Other abnormalities of gait and mobility  Rationale for Evaluation and Treatment: Rehabilitation  SUBJECTIVE:                                                                                                                                                                                             SUBJECTIVE STATEMENT: Subjective provided primarily by patient's daughter.   Patient arrives to session with daughter. Daughter reports that patient had been  having increasing hallucinations thinking the house was flooding, she wet the bed, or granddaughter was conspiring against her. Patient had woke up at 5:00am this morning and tried to go outside as she thought house had flooded. Patient daughter also reports that patient has not eaten/drank since last night and has been eating progressively less. Patient has not taken medication this morning. Denies falls/near falls.   Pt accompanied by: family member daughter  PERTINENT HISTORY: PD with hallucinations, glaucoma, osteoporosis, TB (as a child)    PAIN:  Are you having pain? No  PRECAUTIONS: Fall and Other: Osteoporosis  WEIGHT BEARING RESTRICTIONS: No  FALLS: Has patient fallen in last 6 months? Yes. Number of falls 2  LIVING ENVIRONMENT: Lives with: lives with their family - daughter and granddaughter  Lives in: House/apartment Stairs: Yes: External: 2 steps; none Has following equipment at home: Single point cane and Grab bars  PLOF: Independent, pt's daughter reports sometimes pt needs help with dressing   PATIENT GOALS: See how she was since the last time she was here. Work on the balance   OBJECTIVE:   COGNITION: Overall cognitive status: Impaired   TODAY'S TREATMENT:                                                                                                                                TherAct:  Vitals:   06/15/23 1510  BP: (!) 146/73  Pulse: (!) 51  Vitals: seated on RUE  Extensive part of session providing therapeutic listening by two therapists. Discussed safety and encouraged patient to go to urgent care as neurologist had expressed concern for possible UTI during phone call this morning per patient spouse. Patient reports that she is very confused. She initially refused to go to urgent care due to confusion but eventually agrees with second therapist who is more familiar with patient provides adequate encouragement. Patient denying burning urination at this time. Patient eventually agrees to take a few sips of water. Other therapist went over to [facility] to speak with Dr. Lucia Gaskins to discuss patient status who agreed with urgent care call as they were unable to get patient in. Patient BP assessed and elevated but WFL. Patient with daughter then left for urgent care.   PATIENT EDUCATION: Education details: See above Person educated: Patient and daughter  Education method: Explanation, Demonstration, and Verbal cues Education comprehension: verbalized understanding, returned demonstration, and needs further education  HOME EXERCISE PROGRAM: Copied over from previous bout of therapy: Will review/updated as needed; recommend checking out.   Standing and seated PWR, Pt has in her exercise folder.     Access Code: [email or web address] URL: [email or web address] Date: 10/19/2021 Prepared by: [name]   Reviewed/condensed exercises for balance:    Exercises - Alternating Step Backward with Support  - 1 x daily - 5 x weekly - 1-2 sets - 10 reps - Romberg Stance Eyes Closed on Foam Pad  - 1 x daily - 5 x  weekly - 3 sets - 30 hold   Plus standing tall against the wall for posture - 2 x 30 second holds    GOALS: Goals reviewed with patient? Yes  SHORT TERM GOALS: STG = LTG due to POC length  LONG TERM GOALS: Target date: 07/03/2023  Pt will be independent  with final PD HEP for improved strength, balance, transfers, and gait  Baseline: To be provided/reviewed from last session Goal status: INITIAL  2.  miniBEST to be completed with LTG written.  Baseline: To be completed Goal status: DISCONTINUED due to changes in cognition   3.  Pt will improve gait speed with LRAD to at least 2.71 ft/sec in order to demo improved community mobility.   Baseline: 2.41 ft/sec with rollator Goal status: INITIAL  4.  Patient will improve TUG score to 35 seconds with LRAD to indicate a decreased risk of falls and demonstrate improved overall mobility.   Baseline: 82 to 40.91 seconds with range due to freezing episodes and rollator with SBA-minA  Goal status: INITIAL  5.  Pt will improve 5x sit<>stand to less than or equal to 12 sec to demonstrate improved functional strength and transfer efficiency. Baseline: 13.2 seconds with no UE support Goal status: INITIAL  ASSESSMENT:  CLINICAL IMPRESSION:  Patient visit very limited by changes in medical status as noted above. Provided education and vital assessment to ensure safely transitioned to urgent care. See above for details of session. Will continue per POC as able.    OBJECTIVE IMPAIRMENTS: Abnormal gait, decreased activity tolerance, decreased balance, decreased cognition, decreased coordination, decreased knowledge of use of DME, decreased mobility, difficulty walking, decreased strength, decreased safety awareness, impaired flexibility, impaired tone, and postural dysfunction.   ACTIVITY LIMITATIONS: carrying, lifting, bending, stairs, transfers, dressing, and locomotion level  PARTICIPATION LIMITATIONS: meal prep, cleaning, driving, shopping, and community activity  PERSONAL FACTORS: Age, Behavior pattern, Past/current experiences, Time since onset of injury/illness/exacerbation, and 3+ comorbidities: PD, glaucoma, osteoporosis (newly diagnosed), TB (as a child)    are also affecting patient's  functional outcome.   REHAB POTENTIAL: Good  CLINICAL DECISION MAKING: Evolving/moderate complexity  EVALUATION COMPLEXITY: Moderate  PLAN:  PT FREQUENCY: 2x/week  PT DURATION: 4 weeks  PLANNED INTERVENTIONS: Therapeutic exercises, Therapeutic activity, Neuromuscular re-education, Balance training, Gait training, Patient/Family education, Self Care, Stair training, Vestibular training, Visual/preceptual remediation/compensation, DME instructions, Manual therapy, and Re-evaluation  PLAN FOR NEXT SESSION: monitor BP, resources for demenita as needed, updates with urgent care. Review HEP and update as needed. Work on turning before sitting down and postural awareness, work on stepping reaction and weight shift   Work on bed mobility. Review seated PWR moves   Carmelia Bake, PT, DPT  06/15/2023, 5:11 PM

## 2023-06-15 NOTE — Telephone Encounter (Signed)
PT called patient's daughter prior to session as had noticed phone conversations in medical chart with medical team. Patient and daughter already on way to PT appointment and still wanted to try and be seen before going somewhere else.  Maryruth Eve, PT, DPT

## 2023-06-15 NOTE — Telephone Encounter (Signed)
Therapist came over from [facility], she said that pt was there for therapy.  She was refusing to go to urgent care for eval / possible uti, having delusions/halluciantion, agitation.   Due to not being able to get in with pcp pt was refusing to go.  I relayed that we are not able to see her at this time.  She needs to seek eval as previously  relayed to daughter due to symptoms.  I relayed that I would tell her that Dr. Lucia Gaskins was asked and she said for her to go to ED/urgent care.  Hopefully this will make things easier and pt will follow thru.

## 2023-06-15 NOTE — Telephone Encounter (Signed)
Pt's daughter has called back and the message from Dr Lucia Gaskins was relayed.  Daughter states the reason for her call is that things have escalated in pt not being willing to eat or drink, not willing to take current meds being combative and has tried leaving her home.  Daughter is at a loss and very concerned, she is asking for a call from RN , she can be reached at [phone]

## 2023-06-16 LAB — URINE CULTURE
Culture: NO GROWTH
Special Requests: NORMAL

## 2023-06-19 ENCOUNTER — Ambulatory Visit (INDEPENDENT_AMBULATORY_CARE_PROVIDER_SITE_OTHER): Payer: Medicare Other | Admitting: Nurse Practitioner

## 2023-06-19 ENCOUNTER — Encounter: Payer: Self-pay | Admitting: Physical Therapy

## 2023-06-19 ENCOUNTER — Ambulatory Visit: Payer: Medicare Other | Admitting: Physical Therapy

## 2023-06-19 VITALS — BP 110/60 | HR 105 | Temp 98.1°F | Ht 66.0 in | Wt 120.0 lb

## 2023-06-19 VITALS — BP 84/59 | HR 85

## 2023-06-19 DIAGNOSIS — R4182 Altered mental status, unspecified: Secondary | ICD-10-CM

## 2023-06-19 DIAGNOSIS — Z7189 Other specified counseling: Secondary | ICD-10-CM

## 2023-06-19 DIAGNOSIS — G20A1 Parkinson's disease without dyskinesia, without mention of fluctuations: Secondary | ICD-10-CM | POA: Diagnosis not present

## 2023-06-19 DIAGNOSIS — N3001 Acute cystitis with hematuria: Secondary | ICD-10-CM | POA: Diagnosis not present

## 2023-06-19 DIAGNOSIS — R2681 Unsteadiness on feet: Secondary | ICD-10-CM

## 2023-06-19 DIAGNOSIS — R269 Unspecified abnormalities of gait and mobility: Secondary | ICD-10-CM

## 2023-06-19 DIAGNOSIS — M6281 Muscle weakness (generalized): Secondary | ICD-10-CM

## 2023-06-19 NOTE — Progress Notes (Unsigned)
I,[name], CMA,acting as a Neurosurgeon for [employer], FNP.,have documented all relevant documentation on the behalf of [name], FNP,as directed by  [name], FNP while in the presence of [name], FNP.  Subjective:  Patient ID: Denise [name] , female    DOB: 1950-08-23 , 72 y.o.   MRN: [id number]  Chief Complaint  Patient presents with   ER f/u    HPI  Patient presents today for a ER f/u. Patient's daughter would like to discuss with her about her parkinson's disease she reports the patient has good and bad days. She has not taken her medication for a UTI.   She has seen Dr. Lucia Gaskins - they are deferring to primary care. Went to [facility] on last week. Can come tomorrow from 10a-2p. She has to have a physical, TB test and able to operate with little redirection. She has thought about inpatient 8  She is now interested in hospice care if necessary.  Her daughter reports the patient has concerns about spiritual warfare with taking her medications and wearing certain clothes. Her daughter is also trying to get her in a day program may need a form completed for a physical and TB test. 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     Family History  Problem Relation Age of Onset   Hypertension Mother    Heart disease Mother    Diabetes Father    Hypertension Father    Other Father        had a condition that was "a cousin" to ALS    Diabetes Sister    Tuberculosis Maternal Grandmother    Heart disease Sister    Colitis Daughter    Colon cancer Neg Hx    Colon polyps Neg Hx    Esophageal cancer Neg Hx    Rectal cancer Neg Hx    Stomach cancer Neg Hx      Current Outpatient Medications:    alendronate (FOSAMAX) 70 MG tablet, TAKE 1 TABLET (70 MG TOTAL)  BY MOUTH EVERY 7 DAYS WITH FULL GLASS WATER ON EMPTY STOMACH, Disp: 12 tablet, Rfl: 1   atorvastatin (LIPITOR) 10 MG tablet, TAKE 1 TABLET BY MOUTH EVERY DAY AT BEDTIME MONDAY THROUGH FRIDAY, Disp: 65 tablet, Rfl: 4   Calcium Carb-Cholecalciferol (OYSTER SHELL CALCIUM/VITAMIN D) 500-200 MG-UNIT PACK, Take by mouth., Disp: , Rfl:    carbidopa-levodopa (SINEMET IR) 25-100 MG tablet, Take at 6am,12 and 6pm. Try to avoid protein. May take an extra during the day if needed., Disp: 360 tablet, Rfl: 4   dorzolamide-timolol (COSOPT) 2-0.5 % ophthalmic solution, Place 1 drop into the left eye 2 (two) times daily., Disp: , Rfl:    hydrochlorothiazide (HYDRODIURIL) 12.5 MG tablet, TAKE 1 TABLET BY MOUTH EVERY DAY AS NEEDED FOR LEG SWELLING AS DIRECTED, Disp: 90 tablet, Rfl: 1   ibuprofen (ADVIL) 800 MG tablet, Take 800 mg by mouth every 8 (eight) hours as needed., Disp: , Rfl:    QUEtiapine (SEROQUEL) 25 MG tablet, Take 1 tablet (25 mg total) by mouth 2 (two) times daily., Disp: 180 tablet, Rfl: 3   Travoprost, BAK Free, (TRAVATAN) 0.004 % SOLN ophthalmic solution, 1  drop at bedtime., Disp: , Rfl:    Vitamin D, Ergocalciferol, (DRISDOL) 1.25 MG (50000 UNIT) CAPS capsule, TAKE 1 CAPSULE (50,000 UNITS TOTAL) BY MOUTH EVERY 7 (SEVEN) DAYS, Disp: 12 capsule, Rfl: 0   Allergies  Allergen Reactions   Nystatin-Triamcinolone Swelling    BLISTERS   Triamcinolone Acetonide     Blisters    Cephalexin Itching and Rash     Review of Systems  Constitutional: Negative.   Respiratory: Negative.    Cardiovascular: Negative.   Gastrointestinal: Negative.   Neurological: Negative.   Psychiatric/Behavioral: Negative.       Today's Vitals   06/19/23 1202  BP: 110/60  Pulse: (!) 105  Temp: 98.1 F (36.7 C)  TempSrc: Oral  Weight: 120 lb (54.4 kg)  Height: 5\' 6"  (1.676 m)  PainSc: 0-No pain   Body mass index is 19.37 kg/m.  Wt Readings from Last 3 Encounters:  06/19/23 120 lb (54.4 kg)  06/15/23 121 lb  14.6 oz (55.3 kg)  04/05/23 122 lb (55.3 kg)    The ASCVD Risk score (Arnett DK, et al., 2019) failed to calculate for the following reasons:   The valid total cholesterol range is 130 to 320 mg/dL  Objective:  Physical Exam Vitals reviewed.  Constitutional:      General: She is not in acute distress.    Appearance: Normal appearance.  Cardiovascular:     Rate and Rhythm: Normal rate and regular rhythm.     Pulses: Normal pulses.     Heart sounds: Normal heart sounds. No murmur heard. Pulmonary:     Effort: Pulmonary effort is normal. No respiratory distress.     Breath sounds: Normal breath sounds. No wheezing.  Musculoskeletal:        General: No swelling or tenderness.     Comments: Using rolling walker  Skin:    General: Skin is warm and dry.     Capillary Refill: Capillary refill takes less than 2 seconds.  Neurological:     General: No focal deficit present.     Mental Status: She is alert and oriented to person, place, and time.     Cranial Nerves: No cranial nerve deficit.     Motor: No weakness.  Psychiatric:        Mood and Affect: Mood normal.        Behavior: Behavior normal.        Thought Content: Thought content normal.        Judgment: Judgment normal.         Assessment And Plan:  Altered mental status, unspecified altered mental status type -     Ambulatory referral to Hospice -     AMB Referral [facility] Care Management  Parkinson's disease without dyskinesia, unspecified whether manifestations fluctuate (HCC) -     Ambulatory referral to Hospice -     AMB Referral [facility] Care Management  Acute cystitis with hematuria    Return if symptoms worsen or fail to improve.  Patient was given opportunity to ask questions. Patient verbalized understanding of the plan and was able to repeat key elements of the plan. All questions were answered to their satisfaction.    Jeanell Sparrow, FNP, have reviewed all documentation for this visit. The documentation on  06/19/23 for the exam, diagnosis, procedures, and orders are all accurate and complete.   IF YOU HAVE BEEN REFERRED TO A SPECIALIST, IT MAY TAKE 1-2 WEEKS TO SCHEDULE/PROCESS THE REFERRAL. IF YOU HAVE NOT HEARD FROM US/SPECIALIST  IN TWO WEEKS, PLEASE GIVE [place] A CALL AT [phone].

## 2023-06-19 NOTE — Therapy (Signed)
OUTPATIENT PHYSICAL THERAPY NEURO TREATMENT- ARRIVED NO CHARGE   Patient Name: Denise [name] MRN: [id number] DOB:[birth year], 72 y.o., female Today's Date: 06/19/2023   PCP: [name], FNP REFERRING PROVIDER: Evern Bio Chales Abrahams, MD    END OF SESSION:  PT End of Session - 06/19/23 1407     Visit Number 5   arrived no charge   Number of Visits 9    Date for PT Re-Evaluation 07/03/23    Authorization Type Blue [employer]    PT Start Time 1404    PT Stop Time 1434   full time not used due to arrived no charge   PT Time Calculation (min) 30 min    Equipment Utilized During Treatment --    Activity Tolerance Treatment limited secondary to medical complications (Comment)    Behavior During Therapy Flat affect;WFL for tasks assessed/performed             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   Past Surgical History:  Procedure Laterality Date   TUBAL LIGATION     Patient Active Problem List   Diagnosis Date Noted   Shuffling gait 03/18/2023   Worsened handwriting 03/18/2023   Coordination impairment 03/18/2023   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   Hallucinations 03/13/2023   Influenza vaccination declined 03/13/2023   Need for COVID-19 vaccine 12/30/2022   Fall 11/09/2022   Bradycardia 01/17/2022   Parkinson's disease (HCC) 02/17/2020   Gait abnormality 12/04/2019   Prediabetes 12/03/2018   Vitamin D deficiency 12/03/2018   Anemia due to vitamin B12 deficiency 12/03/2018   Fatigue 12/03/2018   Urinary frequency 10/01/2018   Vitamin B12 deficiency 10/01/2018   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   Abnormal auditory perception of both ears 05/10/2016   Osteopenia 08/23/2012    ONSET DATE: 05/15/2023 (referral date)  REFERRING DIAG: G20.A1 (ICD-10-CM) -  Parkinson's disease without dyskinesia, without mention of fluctuations  THERAPY DIAG:  Muscle weakness (generalized)  Abnormality of gait and mobility  Unsteadiness on feet  Rationale for Evaluation and Treatment: Rehabilitation  SUBJECTIVE:                                                                                                                                                                                             SUBJECTIVE STATEMENT: Subjective provided primarily by patient's daughter.   After PT appt last week,  pt went to Urgent Care and was diagnosed with a UTI. Took her Carbidopa Levodopa today. And was in agreement to take her Seroquel.  Pt's daughter reports that they have taken 2 out of 6 of the UTI pills. Pt has not been taking all her pills recently. Pt's daughter reports that they have put in a referral for a social worker and one for palliative care. Going to go to [facility] tomorrow for a day program to see if she likes it. Reports L leg will start spasming randomly, started about 6 months.   Pt accompanied by: family member daughter  PERTINENT HISTORY: PD with hallucinations, glaucoma, osteoporosis, TB (as a child)    PAIN:  Are you having pain? No  PRECAUTIONS: Fall and Other: Osteoporosis  WEIGHT BEARING RESTRICTIONS: No  FALLS: Has patient fallen in last 6 months? Yes. Number of falls 2  LIVING ENVIRONMENT: Lives with: lives with their family - daughter and granddaughter  Lives in: House/apartment Stairs: Yes: External: 2 steps; none Has following equipment at home: Single point cane and Grab bars  PLOF: Independent, pt's daughter reports sometimes pt needs help with dressing   PATIENT GOALS: See how she was since the last time she was here. Work on the balance   OBJECTIVE:   COGNITION: Overall cognitive status: Impaired   TODAY'S TREATMENT:                                                                                                                                 Vitals:   06/19/23 1419 06/19/23 1421  BP: (!) 100/57 (!) 84/59  Pulse: 68 85   Vitals: seated on RUE  Followed up with pt and pt's daughter regarding Urgent Care visit and pt diagnosed with a UTI. Pt has only taken 2 of her anti-biotics and is not taking all of her medications. Pt's daughter has been encouraging pt to take medications and also drink more water. Assessed pt's BP with pt's BP low in sitting and standing, with pt denying lightheadedness. Pt's daughter reports she has not been drinking a lot of water and pt did not really sleep last night. Discussed at this time, will cancel this weeks and next week's appt until pt is better medically managed with UTI and making sure infection is cleared. Pt and daughter in agreement with this plan. Educated on return ER/Urgent Care instructions regarding UTI.    PATIENT EDUCATION: Education details: See above Person educated: Patient and daughter  Education method: Explanation, Demonstration, and Verbal cues Education comprehension: verbalized understanding, returned demonstration, and needs further education  HOME EXERCISE PROGRAM: Copied over from previous bout of therapy: Will review/updated as needed; recommend checking out.   Standing and seated PWR, Pt has in her exercise folder.     Access Code: [email or web address] URL: [email or web address] Date: 10/19/2021 Prepared by: [name]   Reviewed/condensed exercises for balance:    Exercises - Alternating Step Backward with Support  -  1 x daily - 5 x weekly - 1-2 sets - 10 reps - Romberg Stance Eyes Closed on Foam Pad  - 1 x daily - 5 x weekly - 3 sets - 30 hold   Plus standing tall against the wall for posture - 2 x 30 second holds    GOALS: Goals reviewed with patient? Yes  SHORT TERM GOALS: STG = LTG due to POC length  LONG TERM GOALS: Target date: 07/03/2023  Pt will be independent with final PD HEP for improved strength,  balance, transfers, and gait  Baseline: To be provided/reviewed from last session Goal status: INITIAL  2.  miniBEST to be completed with LTG written.  Baseline: To be completed Goal status: DISCONTINUED due to changes in cognition   3.  Pt will improve gait speed with LRAD to at least 2.71 ft/sec in order to demo improved community mobility.   Baseline: 2.41 ft/sec with rollator Goal status: INITIAL  4.  Patient will improve TUG score to 35 seconds with LRAD to indicate a decreased risk of falls and demonstrate improved overall mobility.   Baseline: 82 to 40.91 seconds with range due to freezing episodes and rollator with SBA-minA  Goal status: INITIAL  5.  Pt will improve 5x sit<>stand to less than or equal to 12 sec to demonstrate improved functional strength and transfer efficiency. Baseline: 13.2 seconds with no UE support Goal status: INITIAL  ASSESSMENT:  CLINICAL IMPRESSION:  See above - arrived no charge. Pt recently diagnosed with a UTI and has not been taking all of her medication. Pt to follow-up with PCP next week to make sure urinalysis shows no infection. If it does, then pt will need to go to ER. Discussed will go on hold until pt no longer has an infection. Pt and pt's daughter verbalized understanding and in agreement with plan.    OBJECTIVE IMPAIRMENTS: Abnormal gait, decreased activity tolerance, decreased balance, decreased cognition, decreased coordination, decreased knowledge of use of DME, decreased mobility, difficulty walking, decreased strength, decreased safety awareness, impaired flexibility, impaired tone, and postural dysfunction.   ACTIVITY LIMITATIONS: carrying, lifting, bending, stairs, transfers, dressing, and locomotion level  PARTICIPATION LIMITATIONS: meal prep, cleaning, driving, shopping, and community activity  PERSONAL FACTORS: Age, Behavior pattern, Past/current experiences, Time since onset of injury/illness/exacerbation, and 3+  comorbidities: PD, glaucoma, osteoporosis (newly diagnosed), TB (as a child)    are also affecting patient's functional outcome.   REHAB POTENTIAL: Good  CLINICAL DECISION MAKING: Evolving/moderate complexity  EVALUATION COMPLEXITY: Moderate  PLAN:  PT FREQUENCY: 2x/week  PT DURATION: 4 weeks  PLANNED INTERVENTIONS: Therapeutic exercises, Therapeutic activity, Neuromuscular re-education, Balance training, Gait training, Patient/Family education, Self Care, Stair training, Vestibular training, Visual/preceptual remediation/compensation, DME instructions, Manual therapy, and Re-evaluation  PLAN FOR NEXT SESSION: monitor BP, resources for demenita as needed, how is pt feeling after UTI?  Review HEP and update as needed. Work on turning before sitting down and postural awareness, work on stepping reaction and weight shift   Work on bed mobility. Review seated PWR moves   Drake Leach, PT, DPT  06/19/2023, 2:42 PM

## 2023-06-20 ENCOUNTER — Telehealth: Payer: Self-pay | Admitting: *Deleted

## 2023-06-20 NOTE — Progress Notes (Signed)
Complex Care Management Note   06/20/2023 Name: Denise [name] MRN: [id number] DOB: 02/03/1951  Denise [name] is a 72 y.o. year old female who sees [name], FNP for primary care. I reached out to Gypsy Decant by phone today to offer complex care management services.  [name] was given information about Complex Care Management services today including:   The Complex Care Management services include support from the care team which includes your Nurse Coordinator, Clinical Social Worker, or Pharmacist.  The Complex Care Management team is here to help remove barriers to the health concerns and goals most important to you. Complex Care Management services are voluntary, and the patient may decline or stop services at any time by request to their care team member.   Complex Care Management Consent Status: Patient agreed to services and verbal consent obtained. Daughter Sherma Mangold DPR on file agreed to services and verbal consent   Follow up plan:  Telephone appointment with complex care management team member scheduled for:  07/03/23  Encounter Outcome:  Patient Scheduled  [facility]  Care Coordination Care Guide  Direct Dial: [phone]

## 2023-06-22 ENCOUNTER — Encounter: Payer: Medicare Other | Admitting: Speech Pathology

## 2023-06-22 ENCOUNTER — Ambulatory Visit: Payer: Medicare Other | Admitting: Physical Therapy

## 2023-06-22 DIAGNOSIS — R4182 Altered mental status, unspecified: Secondary | ICD-10-CM | POA: Insufficient documentation

## 2023-06-22 DIAGNOSIS — Z7189 Other specified counseling: Secondary | ICD-10-CM | POA: Insufficient documentation

## 2023-06-22 DIAGNOSIS — N3001 Acute cystitis with hematuria: Secondary | ICD-10-CM | POA: Insufficient documentation

## 2023-06-22 NOTE — Assessment & Plan Note (Signed)
Continue current medications and f/u with Neurology

## 2023-06-22 NOTE — Assessment & Plan Note (Signed)
Likely related to her urinary tract infection vs Parkinsons. I had a long discussion with patient and daughter about her taking her medications to help her to get better also discussed her decline and the difficulty there daughter is having with caring for her.

## 2023-06-22 NOTE — Assessment & Plan Note (Signed)
She seems to be at a point where Palliative will be a good option.  Had a long conversation about the difference between palliative versus hospice care.  The patient is now more willing to do hospice care if needed.  I have advised them to contact their lawyer who help them with her advance directives to make an update.

## 2023-06-22 NOTE — Assessment & Plan Note (Signed)
She had a UTI when she went to the emergency room a few days ago.  She is on an antibiotic but has only taken a few doses due to concerns that the medications will make her worse.  Reassured patient medications are there to help her and advised her to take it daily to avoid risking going to the hospital with potential sepsis.

## 2023-06-24 ENCOUNTER — Encounter: Payer: Self-pay | Admitting: Nurse Practitioner

## 2023-06-24 DIAGNOSIS — R0689 Other abnormalities of breathing: Secondary | ICD-10-CM | POA: Diagnosis not present

## 2023-06-24 DIAGNOSIS — R0902 Hypoxemia: Secondary | ICD-10-CM | POA: Diagnosis not present

## 2023-06-24 DIAGNOSIS — R000 Tachycardia, unspecified: Secondary | ICD-10-CM | POA: Diagnosis not present

## 2023-06-24 DIAGNOSIS — R4182 Altered mental status, unspecified: Secondary | ICD-10-CM | POA: Diagnosis not present

## 2023-06-24 DIAGNOSIS — R001 Bradycardia, unspecified: Secondary | ICD-10-CM | POA: Diagnosis not present

## 2023-06-24 DIAGNOSIS — R410 Disorientation, unspecified: Secondary | ICD-10-CM | POA: Diagnosis not present

## 2023-06-24 DIAGNOSIS — R9431 Abnormal electrocardiogram [ECG] [EKG]: Secondary | ICD-10-CM | POA: Diagnosis not present

## 2023-06-24 DIAGNOSIS — R918 Other nonspecific abnormal finding of lung field: Secondary | ICD-10-CM | POA: Diagnosis not present

## 2023-06-24 DIAGNOSIS — Z7983 Long term (current) use of bisphosphonates: Secondary | ICD-10-CM | POA: Diagnosis not present

## 2023-06-24 DIAGNOSIS — F0390 Unspecified dementia without behavioral disturbance: Secondary | ICD-10-CM | POA: Diagnosis not present

## 2023-06-24 DIAGNOSIS — G20C Parkinsonism, unspecified: Secondary | ICD-10-CM | POA: Diagnosis not present

## 2023-06-24 DIAGNOSIS — F0280 Dementia in other diseases classified elsewhere without behavioral disturbance: Secondary | ICD-10-CM | POA: Diagnosis not present

## 2023-06-24 DIAGNOSIS — N390 Urinary tract infection, site not specified: Secondary | ICD-10-CM | POA: Diagnosis not present

## 2023-06-26 ENCOUNTER — Ambulatory Visit: Payer: Medicare Other | Admitting: Physical Therapy

## 2023-07-03 ENCOUNTER — Ambulatory Visit: Payer: Self-pay | Admitting: Licensed Clinical Social Worker

## 2023-07-03 ENCOUNTER — Ambulatory Visit: Payer: Medicare Other | Admitting: Physical Therapy

## 2023-07-03 NOTE — Patient Instructions (Signed)
Visit Information  Thank you for taking time to visit with me today. Please don't hesitate to contact me if I can be of assistance to you.   Following are the goals we discussed today:   Goals Addressed             This Visit's Progress    Obtain Supportive Resources-Placement   On track    Activities and task to complete in order to accomplish goals.   Keep all upcoming appointments discussed today Continue with compliance of taking medication prescribed by Doctor Implement healthy coping skills discussed to assist with management of symptoms Follow up with [facility] regarding housing         Our next appointment is by telephone on 01/07 at 1 PM  Please call the care guide team at [phone] if you need to cancel or reschedule your appointment.   If you are experiencing a Mental Health or Behavioral Health Crisis or need someone to talk to, please call the Suicide and Crisis Lifeline: 988 call 911   Patient verbalizes understanding of instructions and care plan provided today and agrees to view in MyChart. Active MyChart status and patient understanding of how to access instructions and care plan via MyChart confirmed with patient.     Jenel Lucks, MSW, LCSW [facility] Care Management [place]  [facility] [email or web address] Phone [phone] 9:47 AM

## 2023-07-03 NOTE — Patient Outreach (Signed)
  Care Coordination   Initial Visit Note   07/03/2023 Name: Denise [name] MRN: [id number] DOB: 02-02-1951  Denise [name] is a 72 y.o. year old female who sees [name], FNP for primary care. I spoke with  Denise [name] by phone today.  What matters to the patients health and wellness today?  Level of Care, Caregiver Stress    Goals Addressed             This Visit's Progress    Obtain Supportive Resources-Placement   On track    Activities and task to complete in order to accomplish goals.   Keep all upcoming appointments discussed today Continue with compliance of taking medication prescribed by Doctor Implement healthy coping skills discussed to assist with management of symptoms Follow up with [facility] regarding housing         SDOH assessments and interventions completed:  Yes  SDOH Interventions Today    Flowsheet Row Most Recent Value  SDOH Interventions   Food Insecurity Interventions Intervention Not Indicated  Housing Interventions Intervention Not Indicated  Transportation Interventions Intervention Not Indicated        Care Coordination Interventions:  Yes, provided  Interventions Today    Flowsheet Row Most Recent Value  General Interventions   General Interventions Discussed/Reviewed General Interventions Discussed, Programmer, applications, Doctor Visits, Level of Care, Communication with  Doctor Visits Discussed/Reviewed Doctor Visits Discussed  Communication with --  [employer] collaborated with Care Guide to assist with scheduing initial appt with RNCM]  Level of Care Adult Daycare, Assisted Living  Exercise Interventions   Exercise Discussed/Reviewed Exercise Discussed  [Patient participates in outpatient physical and speech therapy]  Education Interventions   Education Provided Provided Education  Provided Verbal Education On Mental Health/Coping with Illness  Teacher, English as a foreign language Fatigue]  Mental Health Interventions   Mental Health  Discussed/Reviewed Mental Health Discussed, Coping Strategies, Grief and Loss  Pharmacy Interventions   Pharmacy Dicussed/Reviewed Pharmacy Topics Discussed, Medication Adherence  Safety Interventions   Safety Discussed/Reviewed Safety Discussed       Follow up plan: Follow up call scheduled for 07/11/2023    Encounter Outcome:  Patient Visit Completed   Jenel Lucks, MSW, LCSW [facility] Care Management [facility]  [facility] [email or web address] Phone [phone] 9:46 AM

## 2023-07-06 ENCOUNTER — Ambulatory Visit: Payer: Medicare Other | Attending: Psychiatry | Admitting: Physical Therapy

## 2023-07-06 ENCOUNTER — Encounter: Payer: Self-pay | Admitting: Physical Therapy

## 2023-07-06 ENCOUNTER — Ambulatory Visit: Payer: Medicare Other | Admitting: Speech Pathology

## 2023-07-06 ENCOUNTER — Encounter: Payer: Self-pay | Admitting: Licensed Clinical Social Worker

## 2023-07-06 DIAGNOSIS — R471 Dysarthria and anarthria: Secondary | ICD-10-CM | POA: Diagnosis not present

## 2023-07-06 DIAGNOSIS — R269 Unspecified abnormalities of gait and mobility: Secondary | ICD-10-CM | POA: Insufficient documentation

## 2023-07-06 DIAGNOSIS — R41841 Cognitive communication deficit: Secondary | ICD-10-CM | POA: Diagnosis not present

## 2023-07-06 DIAGNOSIS — M6281 Muscle weakness (generalized): Secondary | ICD-10-CM | POA: Diagnosis not present

## 2023-07-06 DIAGNOSIS — R2681 Unsteadiness on feet: Secondary | ICD-10-CM | POA: Diagnosis not present

## 2023-07-06 DIAGNOSIS — R1310 Dysphagia, unspecified: Secondary | ICD-10-CM

## 2023-07-06 DIAGNOSIS — R2689 Other abnormalities of gait and mobility: Secondary | ICD-10-CM | POA: Insufficient documentation

## 2023-07-06 DIAGNOSIS — R278 Other lack of coordination: Secondary | ICD-10-CM | POA: Diagnosis not present

## 2023-07-06 NOTE — Therapy (Signed)
 OUTPATIENT PHYSICAL THERAPY NEURO TREATMENT / DISCHARGE   Patient Name: Denise [name] MRN: [id number] DOB:1950-11-12, 73 y.o., female Today's Date: 07/06/2023   PCP: Georgina Speaks, FNP REFERRING PROVIDER: Lori Ronal Caldron, MD    PHYSICAL THERAPY DISCHARGE SUMMARY  Visits from Start of Care: 6  Current functional level related to goals / functional outcomes: See below   Remaining deficits: Transitioning to memory care   Education / Equipment: See below, recommend continuing PT possibly in memory care setting as medical team see fits for reinforcement of safety in new living environment    Patient agrees to discharge. Patient goals were partially met. Patient is being discharged due to a change in medical status.   END OF SESSION:  PT End of Session - 07/06/23 1533     Visit Number 6    Number of Visits 9    Date for PT Re-Evaluation 07/03/23    Authorization Type [employer]    PT Start Time 1530    PT Stop Time 1600    PT Time Calculation (min) 30 min    Equipment Utilized During Treatment Gait belt    Activity Tolerance Patient tolerated treatment well    Behavior During Therapy Flat affect;WFL for tasks assessed/performed             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   Past Surgical History:  Procedure Laterality Date   TUBAL LIGATION     Patient Active Problem List   Diagnosis Date Noted   Altered mental status 06/22/2023   Acute cystitis with hematuria 06/22/2023   Advanced care planning/counseling discussion 06/22/2023   Shuffling gait 03/18/2023   Worsened handwriting 03/18/2023   Coordination impairment 03/18/2023   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   Hallucinations 03/13/2023   Influenza vaccination declined 03/13/2023   Need for COVID-19 vaccine 12/30/2022   Fall  11/09/2022   Bradycardia 01/17/2022   Parkinson's disease (HCC) 02/17/2020   Gait abnormality 12/04/2019   Prediabetes 12/03/2018   Vitamin D  deficiency 12/03/2018   Anemia due to vitamin B12 deficiency 12/03/2018   Fatigue 12/03/2018   Urinary frequency 10/01/2018   Vitamin B12 deficiency 10/01/2018   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   Patient reports that she is feeling a bit better. She had to do another round of antibiotics due to UTI not fully clearing. Patient had requested to transition to memory care and will be moving there next week per daughter. Patient continues to be having challenges with eating.   Pt accompanied by: family member daughter  PERTINENT HISTORY: PD with hallucinations, glaucoma, osteoporosis, TB (as a child)    PAIN:  Are you having pain? No  PRECAUTIONS: Fall and Other: Osteoporosis  WEIGHT BEARING RESTRICTIONS: No  FALLS: Has patient fallen in last 6 months? Yes. Number of falls 2  LIVING ENVIRONMENT: Lives with: lives with their family - daughter and granddaughter  Lives in: House/apartment Stairs: Yes: External: 2 steps; none Has following equipment at home:  Single point cane and Grab bars  PLOF: Independent, pt's daughter reports sometimes pt needs help with dressing   PATIENT GOALS: See how she was since the last time she was here. Work on the balance   OBJECTIVE:   COGNITION: Overall cognitive status: Impaired   TODAY'S TREATMENT:                                                                                                                               TherAct: Physical therapy session emphasized extensive education on POC and recommendation of D/C at this time as patient transitioning to memory care. Discussed limitations in carryover of new tasks in current setting given changes in cognition and recommend trial of PT in new memory setting as medical team saw fit once patient settled in following move for upkeep of activity and reinforcement of functional safety in new setting, recommended follow up with neurologist about diet/nutrition concerns given fluctuations in diet with current memory status, patient and caregiver verbalize understanding and agreeable to D/C  PATIENT EDUCATION: Education details: See above Person educated: Patient and daughter  Education method: Explanation, Demonstration, and Verbal cues Education comprehension: verbalized understanding, returned demonstration, and needs further education  HOME EXERCISE PROGRAM: Copied over from previous bout of therapy: Will review/updated as needed; recommend checking out.   Standing and seated PWR, Pt has in her exercise folder.     Access Code: [email or web address] URL: [email or web address] Date: 10/19/2021 Prepared by: Sheffield Senate   Reviewed/condensed exercises for balance:    Exercises - Alternating Step Backward with Support  - 1 x daily - 5 x weekly - 1-2 sets - 10 reps - Romberg Stance Eyes Closed on Foam Pad  - 1 x daily - 5 x weekly - 3 sets - 30 hold   Plus standing tall against the wall for posture - 2 x 30 second holds    GOALS: Goals reviewed  with patient? Yes  SHORT TERM GOALS: STG = LTG due to POC length  LONG TERM GOALS: Target date: 07/03/2023  Pt will be independent with final PD HEP for improved strength, balance, transfers, and  gait  Baseline: Recommended seated PWR! Moves at this time  Goal status: MET  2.  miniBEST to be completed with LTG written.  Baseline: To be completed Goal status: DISCONTINUED due to changes in cognition   3.  Pt will improve gait speed with LRAD to at least 2.71 ft/sec in order to demo improved community mobility.   Baseline: 2.41 ft/sec with rollator Goal status: DISCONTINUED due to changes in medical status  4.  Patient will improve TUG score to 35 seconds with LRAD to indicate a decreased risk of falls and demonstrate improved overall mobility.   Baseline: 82 to 40.91 seconds with range due to freezing episodes and rollator with SBA-minA  Goal status: DISCONTINUED due to changes in medical status  5.  Pt will improve 5x sit<>stand to less than or equal to 12 sec to demonstrate improved functional strength and transfer efficiency. Baseline: 13.2 seconds with no UE support Goal status: DISCONTINUED due to changes in medical status  ASSESSMENT:  CLINICAL IMPRESSION:  Patient is being discharged from skilled physical therapy services due to a change in medical status. Outpatient physical therapy services are not advised at this time as outpatient setting does not allow for translation of tasks reviewed in therapy to home environment given patient's progression of dementia symptoms as well as concerns about increasing activity with current nutritional status. Patient is transitioning to memory care. Patient and spouse verbalize understanding at this time and demonstrate independence in HEP to continue to maintain gains.   OBJECTIVE IMPAIRMENTS: Abnormal gait, decreased activity tolerance, decreased balance, decreased cognition, decreased coordination, decreased knowledge of use of DME,  decreased mobility, difficulty walking, decreased strength, decreased safety awareness, impaired flexibility, impaired tone, and postural dysfunction.   ACTIVITY LIMITATIONS: carrying, lifting, bending, stairs, transfers, dressing, and locomotion level  PARTICIPATION LIMITATIONS: meal prep, cleaning, driving, shopping, and community activity  PERSONAL FACTORS: Age, Behavior pattern, Past/current experiences, Time since onset of injury/illness/exacerbation, and 3+ comorbidities: PD, glaucoma, osteoporosis (newly diagnosed), TB (as a child)    are also affecting patient's functional outcome.   REHAB POTENTIAL: Good  CLINICAL DECISION MAKING: Evolving/moderate complexity  EVALUATION COMPLEXITY: Moderate  PLAN:  PT FREQUENCY: 2x/week  PT DURATION: 4 weeks  PLANNED INTERVENTIONS: Therapeutic exercises, Therapeutic activity, Neuromuscular re-education, Balance training, Gait training, Patient/Family education, Self Care, Stair training, Vestibular training, Visual/preceptual remediation/compensation, DME instructions, Manual therapy, and Re-evaluation  PLAN FOR NEXT SESSION: NA - D/C as noted above  Lauraine DELENA Grumbling, PT, DPT  07/06/2023, 4:58 PM

## 2023-07-06 NOTE — Therapy (Signed)
 OUTPATIENT SPEECH LANGUAGE PATHOLOGY TREATMENT NOTE   Patient Name: Denise [name] MRN: [id number] DOB:Feb 27, 1951, 73 y.o., female Today's Date: 07/06/2023  PCP: Georgina Speaks FNP REFERRING PROVIDER: Lori Ronal Caldron, MD  END OF SESSION:  End of Session - 07/06/23 1446     Visit Number 2    Number of Visits 13    Date for SLP Re-Evaluation 07/18/23    Authorization Type BCBS Medicare    SLP Start Time 1446    SLP Stop Time  1530    SLP Time Calculation (min) 44 min    Activity Tolerance Patient tolerated treatment well             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   Past Surgical History:  Procedure Laterality Date   TUBAL LIGATION     Patient Active Problem List   Diagnosis Date Noted   Altered mental status 06/22/2023   Acute cystitis with hematuria 06/22/2023   Advanced care planning/counseling discussion 06/22/2023   Shuffling gait 03/18/2023   Worsened handwriting 03/18/2023   Coordination impairment 03/18/2023   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   Hallucinations 03/13/2023   Influenza vaccination declined 03/13/2023   Need for COVID-19 vaccine 12/30/2022   Fall 11/09/2022   Bradycardia 01/17/2022   Parkinson's disease (HCC) 02/17/2020   Gait abnormality 12/04/2019   Prediabetes 12/03/2018   Vitamin D  deficiency 12/03/2018   Anemia due to vitamin B12 deficiency 12/03/2018   Fatigue 12/03/2018   Urinary frequency 10/01/2018   Vitamin B12 deficiency 10/01/2018   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 No  FALLS: Has patient fallen in last 6 months?  No  LIVING ENVIRONMENT: Lives with: lives with their family Lives in: House/apartment  PLOF:  Level of assistance: Needed assistance with ADLs, Needed assistance with IADLS Employment: Retired  PATIENT GOALS: pinpoint the cognitive and maintain/improve speech   OBJECTIVE:  Note: Objective measures were completed at Evaluation unless otherwise noted.  COGNITION: Overall cognitive status: Impaired Areas of impairment: Orientation, Attention, Memory, Awareness, and Problem solving Comments: Endorsed decline in cognition resulting in forgetting AD (cane), medications, and appointment times. Experiencing difficulty with recall in conversations resulting in inaccurate responses to questions and need for repeated information, frequent word retrieval difficulties despite use of description strategy   MOTOR SPEECH: Overall motor speech: impaired Level of impairment: Conversation Respiration: thoracic breathing and clavicular breathing Phonation: low vocal intensity Resonance: WFL Articulation: Impaired: conversation Intelligibility: Intelligibility reduced Motor planning: Impaired: inconsistent Interfering components:  PD, cognitive decline Effective technique: slow rate, increased vocal intensity, over articulate, and pacing  ORAL MOTOR EXAMINATION: Overall status: WFL  OBJECTIVE VOICE ASSESSMENT: Conversational loudness average: low 60s dB Voice quality: low vocal  intensity and vocal fatigue Stimulability trials: Given SLP modeling and occasional mod cues, loudness average  increased to upper 60s dB at sentence level.  Comments: Despite daily online [facility], pt exhibits volume decay averaging low 60s dB conversational volume in quiet environment. With verbal and non-verbal cues, pt able to increase vocal intensity to upper 60s dB.   Pt does report difficulty with swallowing. MBSS completed 05-25-23. See below: INSTRUMENTAL SWALLOW STUDY FINDINGS (MBSS)  Ms. Zari presents with functional oropharyngeal swallow. She accepted PO trials of thin, mildly thick, and moderately thick liquids, puree and dysphagia 3 solids. Lingual control was good during directed bolus hold, though during A-P transit some lingual pumping exhibited. Reduced base of tongue retraction also evidenced with narrow column of bolus between base of tongue and posterior pharyngeal wll. All bolus consistencies were noted to leave mild reside in oral cavity with pt moving into pharynx following initial swallow. Pt did not appear sensate to this trace amount of residue, no subsequent swallow to clear noted across exam. Pt's pharyngeal function appears to be within normal limits with complete closure of airway via hyolaryngeal elevation and epiglottic inversion. No penetration or aspiration of any inconsistencies noted. Previously mentioned pharyngeal residue does not appear to be related to reduced strength or amplitude of structure movement in pharynx, as residue not noted until moved into pharynx from oral cavity. Pt demonstrates complete distension of upper esophageal sphincter, with boluses noted to completely move into esophagus. Esophageal sweep was unremarkable. Suggest ongoing regular solids with thin liquids, employment of aspiration precautions. Pt may benefit from reducing distractions during meal times d/t complaints of coughing with meals. Pt does have PD, reduced intent during automatic movements may decrease the efficiency of those motor movements. Pt and caregiver (daughter) verbalize  understanding of all recommendations. Pt is scheduled to follow up with this SLP in OP setting.   PATIENT REPORTED OUTCOME MEASURES (PROM): Memory Mistakes: 22 (N/A for # 3, 13, 16)  TODAY'S TREATMENT:                                                                                                                                         07/06/2023: Addressed memory compensations via generation of memory book, d/t pt reporting frustration with reduced recall resulting in having to ask Olam same information multiple times. SLP provided education on ways to utilize memory book for recall of functional and personally salient information. Suggested purchasing binder to utilize. Provided page for daily orientation to date/daily activities and calendar. Pt required mod-A for calendar reading. Suggested adding pages for family members to recall important details such as times she can call (live hours away) or their scheduled (e.g. sporting meets). Olam verbalized understanding and appreciation for recommendation.   PATIENT EDUCATION: Education details: see above Person educated: Patient and Child(ren) Education method: Medical Illustrator Education comprehension: verbalized understanding, returned demonstration, and needs further  education  HOME EXERCISE PROGRAM: Speak Out!   GOALS: Goals reviewed with patient? Yes  SHORT TERM GOALS: Target date: 07/04/2023  Pt will achieve targeted dB levels in demonstration of Speak Out! Lessons with 90% accuracy given occasional min A over 2 sessions  Baseline: Goal status: INITIAL  2.  Pt will utilize dysarthria compensations in 5-10 minute conversation and average WNL conversational volume (70-72 dB) given usual min A over 2 sessions  Baseline:  Goal status: INITIAL  3.  Pt and family will carryover memory compensations to aid daily functioning at home given usual min A over 2 sessions  Baseline:  Goal status: INITIAL  4.  Pt will demonstrate  recommended swallow strategies/exercises with occasional mod A over 2 sessions  Baseline:  Goal status: INITIAL   LONG TERM GOALS: Target date: 07/18/2023   Pt will utilize dysarthria compensations in 15+ minute conversation to optimize vocal intensity and clarity given occasional min A over 2 sessions  Baseline:  Goal status: INITIAL  2.  Pt and family will carryover memory compensations to aid daily functioning at home with occasional min A > 1 week Baseline:  Goal status: INITIAL  3.  Pt will utilize intent and recommended strategies to optimize swallow function with less frequent s/sx > 1 week  Baseline:  Goal status: INITIAL  ASSESSMENT:  CLINICAL IMPRESSION: Patient is a 73 y.o. F who was seen today for progression of Parkinson's Disease. Well known to this clinic from prior ST courses. Recently completed ST screening revealing decline in cognition, increased swallowing difficulty, and low vocal intensity. MBSS completed with functional oropharyngeal swallowing reported but suspect PD impacting inconsistent swallow function. Endorsed intermittent difficulty with solids with daughter tracking troubled foods. Denied difficulty with thin liquids. Continues to complete daily online Speak Out! Program but demonstrates overall volume decay in conversation averaging low 60s dB. Most concerned with cognitive linguistic decline resulting in frequent word finding, forgetting assistive device around house, reduced recall of medications, and repeating self in conversation. Pt would benefit from short repeat course of speech therapy to optimize communication effectiveness, swallow safety, and cognitive functioning.   OBJECTIVE IMPAIRMENTS: Objective impairments include attention, memory, executive functioning, dysarthria, and dysphagia. These impairments are limiting patient from household responsibilities, effectively communicating at home and in community, and safety when swallowing.Factors  affecting potential to achieve goals and functional outcome are medical prognosis and previous level of function.. Patient will benefit from skilled SLP services to address above impairments and improve overall function.  REHAB POTENTIAL: Good  PLAN:  SLP FREQUENCY: 2x/week  SLP DURATION: 6 weeks  PLANNED INTERVENTIONS: 92522- Speech Eval Sound Prod, Articulate, Phonological, 07473 Treatment of swallowing function, 92507 Treatment of speech (30 or 45 min) , Aspiration precaution training, Pharyngeal strengthening exercises, Language facilitation, Cueing hierachy, Cognitive reorganization, Internal/external aids, Functional tasks, Multimodal communication approach, SLP instruction and feedback, Compensatory strategies, and Patient/family education    Harlene LITTIE Ned, CCC-SLP 07/06/2023, 2:47 PM

## 2023-07-07 ENCOUNTER — Telehealth: Payer: Self-pay | Admitting: *Deleted

## 2023-07-07 NOTE — Patient Instructions (Signed)
 Visit Information  Thank you for taking time to visit with me today. Please don't hesitate to contact me if I can be of assistance to you.   Following are the goals we discussed today:   Goals Addressed             This Visit's Progress    Obtain Supportive Resources-Placement   On track    Activities and task to complete in order to accomplish goals.   Keep all upcoming appointments discussed today Continue with compliance of taking medication prescribed by Doctor Implement healthy coping skills discussed to assist with management of symptoms Follow up with [facility] regarding housing         Our next appointment is by telephone on 1/7 at 1 PM  Please call the care guide team at [phone] if you need to cancel or reschedule your appointment.   If you are experiencing a Mental Health or Behavioral Health Crisis or need someone to talk to, please call the Suicide and Crisis Lifeline: 988 call 911   Patient verbalizes understanding of instructions and care plan provided today and agrees to view in MyChart. Active MyChart status and patient understanding of how to access instructions and care plan via MyChart confirmed with patient.     Denise [name], MSW, LCSW [facility] Care Management [place]  [facility] [email or web address] Phone [phone] 5:29 PM

## 2023-07-07 NOTE — Progress Notes (Signed)
 Complex Care Management Care Guide Note  07/07/2023 Name: Sarahelizabeth Conway MRN: [id number] DOB: Jun 06, 1951  Loralyn Rachel is a 73 y.o. year old female who is a primary care patient of Georgina Speaks, FNP and is actively engaged with the care management team. I reached out to Rilla Hint by phone today to assist with scheduling  with the RN Case Manager.  Follow up plan: Unsuccessful telephone outreach attempt made.  [facility]  Care Coordination Care Guide  Direct Dial: [phone]

## 2023-07-07 NOTE — Patient Outreach (Signed)
  Care Coordination   Collaboration  Visit Note   07/06/2023 Name: Denise [name] MRN: [id number] DOB: 01-08-1951  Denise [name] is a 73 y.o. year old female who sees Georgina Speaks, FNP for primary care. I  engaged with PCP  What matters to the patients health and wellness today?  Level of Care and Caregiver Stress    Goals Addressed             This Visit's Progress    Obtain Supportive Resources-Placement   On track    Activities and task to complete in order to accomplish goals.   Keep all upcoming appointments discussed today Continue with compliance of taking medication prescribed by Doctor Implement healthy coping skills discussed to assist with management of symptoms Follow up with [facility] regarding housing         SDOH assessments and interventions completed:  No     Care Coordination Interventions:  Yes, provided  Interventions Today    Flowsheet Row Most Recent Value  Chronic Disease   Chronic disease during today's visit Other  [PD]  General Interventions   General Interventions Discussed/Reviewed Communication with  Communication with PCP/Specialists  Level of Care Assisted Living  [Will f/up with pt's daughter about transition to preferred ALF]  Mental Health Interventions   Mental Health Discussed/Reviewed Mental Health Reviewed  [Caregiver Stress]       Follow up plan: Follow up call scheduled for 1-2 weeks    Encounter Outcome:  Patient Visit Completed   Rolin Kerns, MSW, LCSW [facility] Care Management [facility]  [facility] [email or web address] Phone [phone] 5:29 PM

## 2023-07-10 ENCOUNTER — Telehealth: Payer: Self-pay

## 2023-07-10 ENCOUNTER — Ambulatory Visit: Payer: Medicare Other

## 2023-07-10 ENCOUNTER — Other Ambulatory Visit: Payer: Self-pay | Admitting: Nurse Practitioner

## 2023-07-10 ENCOUNTER — Encounter: Payer: Self-pay | Admitting: Nurse Practitioner

## 2023-07-10 ENCOUNTER — Ambulatory Visit: Payer: Medicare Other | Admitting: Physical Therapy

## 2023-07-10 NOTE — Telephone Encounter (Signed)
 Spoke with Daisy Lazar from [facility] about patients orders- Gearldine Bienenstock stated she would fax over papers- papers where never received. I reached out to brandy regarding papers - she stated she had to leave and we should get papers in the morning.

## 2023-07-10 NOTE — Therapy (Deleted)
 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   Past Surgical History:  Procedure Laterality Date   TUBAL LIGATION     Patient Active Problem List   Diagnosis Date Noted   Altered mental status 06/22/2023   Acute cystitis with hematuria 06/22/2023   Advanced care planning/counseling discussion 06/22/2023   Shuffling gait 03/18/2023   Worsened handwriting 03/18/2023   Coordination impairment 03/18/2023   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   Hallucinations 03/13/2023   Influenza vaccination declined 03/13/2023   Need for COVID-19 vaccine 12/30/2022   Fall 11/09/2022   Bradycardia 01/17/2022   Parkinson's disease (HCC) 02/17/2020   Gait abnormality 12/04/2019   Prediabetes 12/03/2018   Vitamin D  deficiency 12/03/2018   Anemia due to vitamin B12 deficiency 12/03/2018   Fatigue 12/03/2018   Urinary frequency 10/01/2018   Vitamin B12 deficiency 10/01/2018   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  Rationale for Evaluation and Treatment: Rehabilitation  SUBJECTIVE:   SUBJECTIVE STATEMENT: Pt reports has finished medication course for UTI, have f/u scheduled with PCP   PERTINENT HISTORY: PD, glaucoma, osteoporosis (newly diagnosed), TB (as a child)    PAIN:  Are you having pain? No  FALLS: Has  patient fallen in last 6 months?  No  LIVING ENVIRONMENT: Lives with: lives with their family Lives in: House/apartment  PLOF:  Level of assistance: Needed assistance with ADLs, Needed assistance with IADLS Employment: Retired  PATIENT GOALS: pinpoint the cognitive and maintain/improve speech   OBJECTIVE:  Note: Objective measures were completed at Evaluation unless otherwise noted.  COGNITION: Overall cognitive status: Impaired Areas of impairment: Orientation, Attention, Memory, Awareness, and Problem solving Comments: Endorsed decline in cognition resulting in forgetting AD (cane), medications, and appointment times. Experiencing difficulty with recall in conversations resulting in inaccurate responses to questions and need for repeated information, frequent word retrieval difficulties despite use of description strategy   MOTOR SPEECH: Overall motor speech: impaired Level of impairment: Conversation Respiration: thoracic breathing and clavicular breathing Phonation: low vocal intensity Resonance: WFL Articulation: Impaired: conversation Intelligibility: Intelligibility reduced Motor planning: Impaired: inconsistent Interfering components:  PD, cognitive decline Effective technique: slow rate, increased vocal intensity, over articulate, and pacing  ORAL MOTOR EXAMINATION: Overall status: WFL  OBJECTIVE VOICE ASSESSMENT: Conversational loudness average: low 60s dB Voice quality: low vocal intensity and vocal fatigue Stimulability trials: Given SLP modeling and occasional mod cues, loudness average increased to upper 60s dB at sentence level.  Comments: Despite daily online Spe[facility]! Practice, pt exhibits volume decay averaging low 60s dB conversational volume in quiet environment. With verbal and non-verbal cues, pt able to increase vocal intensity to upper 60s dB.   Pt does report difficulty with swallowing. MBSS completed 05-25-23. See below: INSTRUMENTAL SWALLOW  STUDY FINDINGS (MBSS)  Ms. Dawnya presents with functional oropharyngeal swallow.  She accepted PO trials of thin, mildly thick, and moderately thick liquids, puree and dysphagia 3 solids. Lingual control was good during directed bolus hold, though during A-P transit some lingual pumping exhibited. Reduced base of tongue retraction also evidenced with narrow column of bolus between base of tongue and posterior pharyngeal wll. All bolus consistencies were noted to leave mild reside in oral cavity with pt moving into pharynx following initial swallow. Pt did not appear sensate to this trace amount of residue, no subsequent swallow to clear noted across exam. Pt's pharyngeal function appears to be within normal limits with complete closure of airway via hyolaryngeal elevation and epiglottic inversion. No penetration or aspiration of any inconsistencies noted. Previously mentioned pharyngeal residue does not appear to be related to reduced strength or amplitude of structure movement in pharynx, as residue not noted until moved into pharynx from oral cavity. Pt demonstrates complete distension of upper esophageal sphincter, with boluses noted to completely move into esophagus. Esophageal sweep was unremarkable. Suggest ongoing regular solids with thin liquids, employment of aspiration precautions. Pt may benefit from reducing distractions during meal times d/t complaints of coughing with meals. Pt does have PD, reduced intent during automatic movements may decrease the efficiency of those motor movements. Pt and caregiver (daughter) verbalize understanding of all recommendations. Pt is scheduled to follow up with this SLP in OP setting.   PATIENT REPORTED OUTCOME MEASURES (PROM): Memory Mistakes: 22 (N/A for # 3, 13, 16)  TODAY'S TREATMENT:                                                                                                                                         07/10/23:  07/06/2023: Addressed memory  compensations via generation of memory book, d/t pt reporting frustration with reduced recall resulting in having to ask Olam same information multiple times. SLP provided education on ways to utilize memory book for recall of functional and personally salient information. Suggested purchasing binder to utilize. Provided page for daily orientation to date/daily activities and calendar. Pt required mod-A for calendar reading. Suggested adding pages for family members to recall important details such as times she can call (live hours away) or their scheduled (e.g. sporting meets). Olam verbalized understanding and appreciation for recommendation.   PATIENT EDUCATION: Education details: see above Person educated: Patient and Child(ren) Education method: Medical Illustrator Education comprehension: verbalized understanding, returned demonstration, and needs further education  HOME EXERCISE PROGRAM: Speak Out!   GOALS: Goals reviewed with patient? Yes  SHORT TERM GOALS: Target date: 07/04/2023  Pt will achieve targeted dB levels in demonstration of Speak Out! Lessons with 90% accuracy given occasional min A over 2 sessions  Baseline: Goal status: INITIAL  2.  Pt will utilize dysarthria compensations in 5-10 minute conversation and average WNL conversational volume (70-72 dB) given usual min A over 2 sessions  Baseline:  Goal status: INITIAL  3.  Pt and family will carryover memory compensations to aid daily functioning at home given usual min A over 2 sessions  Baseline:  Goal status: INITIAL  4.  Pt will demonstrate recommended swallow strategies/exercises with occasional mod A over 2 sessions  Baseline:  Goal status: INITIAL   LONG TERM GOALS: Target date: 07/18/2023   Pt will utilize dysarthria compensations in 15+ minute conversation to optimize vocal intensity and clarity given occasional min A over 2 sessions  Baseline:  Goal status: INITIAL  2.  Pt and family will  carryover memory compensations to aid daily functioning at home with occasional min A > 1 week Baseline:  Goal status: INITIAL  3.  Pt will utilize intent and recommended strategies to optimize swallow function with less frequent s/sx > 1 week  Baseline:  Goal status: INITIAL  ASSESSMENT:  CLINICAL IMPRESSION: Patient is a 73 y.o. F who was seen today for progression of Parkinson's Disease. Well known to this clinic from prior ST courses. Recently completed ST screening revealing decline in cognition, increased swallowing difficulty, and low vocal intensity. MBSS completed with functional oropharyngeal swallowing reported but suspect PD impacting inconsistent swallow function. Endorsed intermittent difficulty with solids with daughter tracking troubled foods. Denied difficulty with thin liquids. Continues to complete daily online Speak Out! Program but demonstrates overall volume decay in conversation averaging low 60s dB. Most concerned with cognitive linguistic decline resulting in frequent word finding, forgetting assistive device around house, reduced recall of medications, and repeating self in conversation. Pt would benefit from short repeat course of speech therapy to optimize communication effectiveness, swallow safety, and cognitive functioning.   OBJECTIVE IMPAIRMENTS: Objective impairments include attention, memory, executive functioning, dysarthria, and dysphagia. These impairments are limiting patient from household responsibilities, effectively communicating at home and in community, and safety when swallowing.Factors affecting potential to achieve goals and functional outcome are medical prognosis and previous level of function.. Patient will benefit from skilled SLP services to address above impairments and improve overall function.  REHAB POTENTIAL: Good  PLAN:  SLP FREQUENCY: 2x/week  SLP DURATION: 6 weeks  PLANNED INTERVENTIONS: 92522- Speech Eval Sound Prod, Articulate,  Phonological, 07473 Treatment of swallowing function, 92507 Treatment of speech (30 or 45 min) , Aspiration precaution training, Pharyngeal strengthening exercises, Language facilitation, Cueing hierachy, Cognitive reorganization, Internal/external aids, Functional tasks, Multimodal communication approach, SLP instruction and feedback, Compensatory strategies, and Patient/family education    Comer LILLETTE Louder, CCC-SLP 07/10/2023, 7:48 AM

## 2023-07-11 ENCOUNTER — Ambulatory Visit: Payer: Self-pay | Admitting: Licensed Clinical Social Worker

## 2023-07-11 ENCOUNTER — Other Ambulatory Visit: Payer: Self-pay | Admitting: Nurse Practitioner

## 2023-07-11 MED ORDER — VITAMIN D (ERGOCALCIFEROL) 1.25 MG (50000 UNIT) PO CAPS: 50000.0000 [IU] | ORAL_CAPSULE | ORAL | 0 refills | Status: DC

## 2023-07-11 NOTE — Patient Instructions (Signed)
 Visit Information  Thank you for taking time to visit with me today. Please don't hesitate to contact me if I can be of assistance to you.   Following are the goals we discussed today:   Goals Addressed             This Visit's Progress    Obtain Supportive Resources-Placement   On track    Activities and task to complete in order to accomplish goals.   Keep all upcoming appointments discussed today Continue with compliance of taking medication prescribed by Doctor Implement healthy coping skills discussed to assist with management of symptoms          Our next appointment is by telephone on 1/21 at 3 PM  Please call the care guide team at [phone] if you need to cancel or reschedule your appointment.   If you are experiencing a Mental Health or Behavioral Health Crisis or need someone to talk to, please call the Suicide and Crisis Lifeline: 988 call 911   Patient verbalizes understanding of instructions and care plan provided today and agrees to view in MyChart. Active MyChart status and patient understanding of how to access instructions and care plan via MyChart confirmed with patient.     Marc Leichter, MSW, LCSW [facility] Care Management [place]  [facility] [email or web address] Phone [phone] 2:47 PM

## 2023-07-11 NOTE — Patient Outreach (Signed)
  Care Coordination   Follow Up Visit Note   07/11/2023 Name: Romaine Maciolek MRN: [id number] DOB: 06-15-1951  Lindalou Soltis is a 73 y.o. year old female who sees Georgina Speaks, FNP for primary care. I spoke with  Rilla Ohm daughter, Olam, by phone today.  What matters to the patients health and wellness today?  Level of Care, Transportation, Housing    Goals Addressed             This Visit's Progress    Obtain Supportive Resources-Placement   On track    Activities and task to complete in order to accomplish goals.   Keep all upcoming appointments discussed today Continue with compliance of taking medication prescribed by Doctor Implement healthy coping skills discussed to assist with management of symptoms          SDOH assessments and interventions completed:  No     Care Coordination Interventions:  Yes, provided  Interventions Today    Flowsheet Row Most Recent Value  Chronic Disease   Chronic disease during today's visit Other  [PD]  General Interventions   General Interventions Discussed/Reviewed General Interventions Reviewed, Doctor Visits, Level of Care  Doctor Visits Discussed/Reviewed Doctor Visits Reviewed  Level of Care Assisted Living  [Patient is moving in ALF today. Staff has sent PCP ppwk to review and complete after medication adjustements were made. PCP office aware]  Exercise Interventions   Exercise Discussed/Reviewed Exercise Reviewed  [Pt will resume PT at ALF. Will continue with outpatient ST, daughter and facility will provide transportation]  Mental Health Interventions   Mental Health Discussed/Reviewed Mental Health Reviewed, Coping Strategies, Anxiety  Pharmacy Interventions   Pharmacy Dicussed/Reviewed Pharmacy Topics Reviewed, Medication Adherence  Safety Interventions   Safety Discussed/Reviewed Safety Reviewed       Follow up plan: Follow up call scheduled for 2-4 weeks    Encounter Outcome:  Patient Visit Completed    Rolin Kerns, MSW, LCSW [facility] Care Management [facility]  [facility] [email or web address] Phone [phone] 2:46 PM

## 2023-07-13 ENCOUNTER — Ambulatory Visit: Payer: Medicare Other | Admitting: Physical Therapy

## 2023-07-13 ENCOUNTER — Ambulatory Visit: Payer: Medicare Other | Admitting: Speech Pathology

## 2023-07-13 DIAGNOSIS — R471 Dysarthria and anarthria: Secondary | ICD-10-CM

## 2023-07-13 DIAGNOSIS — R2681 Unsteadiness on feet: Secondary | ICD-10-CM | POA: Diagnosis not present

## 2023-07-13 DIAGNOSIS — R1310 Dysphagia, unspecified: Secondary | ICD-10-CM

## 2023-07-13 DIAGNOSIS — R278 Other lack of coordination: Secondary | ICD-10-CM | POA: Diagnosis not present

## 2023-07-13 DIAGNOSIS — M6281 Muscle weakness (generalized): Secondary | ICD-10-CM | POA: Diagnosis not present

## 2023-07-13 DIAGNOSIS — R269 Unspecified abnormalities of gait and mobility: Secondary | ICD-10-CM | POA: Diagnosis not present

## 2023-07-13 DIAGNOSIS — R41841 Cognitive communication deficit: Secondary | ICD-10-CM

## 2023-07-13 DIAGNOSIS — R2689 Other abnormalities of gait and mobility: Secondary | ICD-10-CM | POA: Diagnosis not present

## 2023-07-13 NOTE — Therapy (Signed)
 OUTPATIENT SPEECH LANGUAGE PATHOLOGY TREATMENT NOTE   Patient Name: Denise [name] MRN: [id number] DOB:[birth year], 73 y.o., female Today's Date: 07/13/2023  PCP: Georgina Speaks FNP REFERRING PROVIDER: Lori Ronal Caldron, MD  END OF SESSION:  End of Session - 07/13/23 1452     Visit Number 3    Number of Visits 13    Date for SLP Re-Evaluation 07/18/23    Authorization Type BCBS Medicare    SLP Start Time 1452    SLP Stop Time  1527    SLP Time Calculation (min) 35 min    Activity Tolerance Patient tolerated treatment well              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   Past Surgical History:  Procedure Laterality Date   TUBAL LIGATION     Patient Active Problem List   Diagnosis Date Noted   Altered mental status 06/22/2023   Acute cystitis with hematuria 06/22/2023   Advanced care planning/counseling discussion 06/22/2023   Shuffling gait 03/18/2023   Worsened handwriting 03/18/2023   Coordination impairment 03/18/2023   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   Hallucinations 03/13/2023   Influenza vaccination declined 03/13/2023   Need for COVID-19 vaccine 12/30/2022   Fall 11/09/2022   Bradycardia 01/17/2022   Parkinson's disease (HCC) 02/17/2020   Gait abnormality 12/04/2019   Prediabetes 12/03/2018   Vitamin D  deficiency 12/03/2018   Anemia due to vitamin B12 deficiency 12/03/2018   Fatigue 12/03/2018   Urinary frequency 10/01/2018   Vitamin B12 deficiency 10/01/2018   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 No  FALLS: Has patient fallen in last 6 months?  No  LIVING ENVIRONMENT: Lives with: lives with their family Lives in: House/apartment  PLOF:  Level of assistance: Needed assistance with ADLs, Needed assistance with IADLS Employment: Retired  PATIENT GOALS: pinpoint the cognitive and maintain/improve speech   OBJECTIVE:  Note: Objective measures were completed at Evaluation unless otherwise noted.  COGNITION: Overall cognitive status: Impaired Areas of impairment: Orientation, Attention, Memory, Awareness, and Problem solving Comments: Endorsed decline in cognition resulting in forgetting AD (cane), medications, and appointment times. Experiencing difficulty with recall in conversations resulting in inaccurate responses to questions and need for repeated information, frequent word retrieval difficulties despite use of description strategy   MOTOR SPEECH: Overall motor speech: impaired Level of impairment: Conversation Respiration: thoracic breathing and clavicular breathing Phonation: low vocal intensity Resonance: WFL Articulation: Impaired: conversation Intelligibility: Intelligibility reduced Motor planning: Impaired: inconsistent Interfering components:  PD, cognitive decline Effective technique: slow rate, increased vocal intensity, over articulate, and pacing  ORAL MOTOR EXAMINATION: Overall status: WFL  OBJECTIVE VOICE ASSESSMENT: Conversational loudness average: low 60s dB Voice quality: low  vocal intensity and vocal fatigue Stimulability trials: Given SLP modeling and occasional mod cues, loudness average  increased to upper 60s dB at sentence level.  Comments: Despite daily online Speak Out! Practice, pt exhibits volume decay averaging low 60s dB conversational volume in quiet environment. With verbal and non-verbal cues, pt able to increase vocal intensity to upper 60s dB.   Pt does report difficulty with swallowing. MBSS completed 05-25-23. See below: INSTRUMENTAL SWALLOW STUDY FINDINGS (MBSS)  Ms. Judaea presents with functional oropharyngeal swallow. She accepted PO trials of thin, mildly thick, and moderately thick liquids, puree and dysphagia 3 solids. Lingual control was good during directed bolus hold, though during A-P transit some lingual pumping exhibited. Reduced base of tongue retraction also evidenced with narrow column of bolus between base of tongue and posterior pharyngeal wll. All bolus consistencies were noted to leave mild reside in oral cavity with pt moving into pharynx following initial swallow. Pt did not appear sensate to this trace amount of residue, no subsequent swallow to clear noted across exam. Pt's pharyngeal function appears to be within normal limits with complete closure of airway via hyolaryngeal elevation and epiglottic inversion. No penetration or aspiration of any inconsistencies noted. Previously mentioned pharyngeal residue does not appear to be related to reduced strength or amplitude of structure movement in pharynx, as residue not noted until moved into pharynx from oral cavity. Pt demonstrates complete distension of upper esophageal sphincter, with boluses noted to completely move into esophagus. Esophageal sweep was unremarkable. Suggest ongoing regular solids with thin liquids, employment of aspiration precautions. Pt may benefit from reducing distractions during meal times d/t complaints of coughing with meals. Pt does have PD, reduced intent during automatic movements may decrease the efficiency of those motor movements. Pt and caregiver (daughter) verbalize  understanding of all recommendations. Pt is scheduled to follow up with this SLP in OP setting.   PATIENT REPORTED OUTCOME MEASURES (PROM): Memory Mistakes: 22 (N/A for # 3, 13, 16)  TODAY'S TREATMENT:                                                                                                                                         07/13/23: Reviewed recommendations for HEP and follow up with SLP at new facility. Discussed how memory book can be modified for novel environment. Provided summary of all information via patient handout. Scheduled for PD screen f/u in 6 mos.   07/06/2023: Addressed memory compensations via generation of memory book, d/t pt reporting frustration with reduced recall resulting in having to ask Olam same information multiple times. SLP provided education on ways to utilize memory book for recall of functional and personally salient information. Suggested purchasing binder to utilize. Provided page for daily orientation to date/daily activities and calendar. Pt required mod-A for calendar reading. Suggested adding pages for family members to recall important details such as times she can call (live  hours away) or their scheduled (e.g. sporting meets). Olam verbalized understanding and appreciation for recommendation.   PATIENT EDUCATION: Education details: see above Person educated: Patient and Child(ren) Education method: Medical Illustrator Education comprehension: verbalized understanding, returned demonstration, and needs further education  HOME EXERCISE PROGRAM: Speak Out!   GOALS: Goals reviewed with patient? Yes  SHORT TERM GOALS: Target date: 07/04/2023  Pt will achieve targeted dB levels in demonstration of Speak Out! Lessons with 90% accuracy given occasional min A over 2 sessions  Baseline: Goal status: NOT MET  2.  Pt will utilize dysarthria compensations in 5-10 minute conversation and average WNL conversational volume (70-72 dB) given usual  min A over 2 sessions  Baseline:  Goal status:  NOT MET  3.  Pt and family will carryover memory compensations to aid daily functioning at home given usual min A over 2 sessions  Baseline:  Goal status: PARTIALLY MET  4.  Pt will demonstrate recommended swallow strategies/exercises with occasional mod A over 2 sessions  Baseline:  Goal status: DEFERRED (functional swallow)   LONG TERM GOALS: Target date: 07/18/2023   Pt will utilize dysarthria compensations in 15+ minute conversation to optimize vocal intensity and clarity given occasional min A over 2 sessions  Baseline:  Goal status: NOT MET  2.  Pt and family will carryover memory compensations to aid daily functioning at home with occasional min A > 1 week Baseline:  Goal status: NOT MET  3.  Pt will utilize intent and recommended strategies to optimize swallow function with less frequent s/sx > 1 week  Baseline:  Goal status: DEFERRED (functional swallow)  ASSESSMENT:  CLINICAL IMPRESSION: Patient is a 73 y.o. F who was seen today for progression of Parkinson's Disease. Well known to this clinic from prior ST courses. Limited therapy course d/t ongoing medical concerns re: hypotension and cognitive impairments, UTI. Pt planning to transition to memory care unit. Agreeable to d/c this date.   OBJECTIVE IMPAIRMENTS: Objective impairments include attention, memory, executive functioning, dysarthria, and dysphagia. These impairments are limiting patient from household responsibilities, effectively communicating at home and in community, and safety when swallowing.Factors affecting potential to achieve goals and functional outcome are medical prognosis and previous level of function.. Patient will benefit from skilled SLP services to address above impairments and improve overall function.  REHAB POTENTIAL: Good  PLAN:  SLP FREQUENCY: 2x/week  SLP DURATION: 6 weeks  PLANNED INTERVENTIONS: 92522- Speech Eval Sound Prod,  Articulate, Phonological, 07473 Treatment of swallowing function, 92507 Treatment of speech (30 or 45 min) , Aspiration precaution training, Pharyngeal strengthening exercises, Language facilitation, Cueing hierachy, Cognitive reorganization, Internal/external aids, Functional tasks, Multimodal communication approach, SLP instruction and feedback, Compensatory strategies, and Patient/family education  SPEECH THERAPY DISCHARGE SUMMARY  Visits from Start of Care: 3  Current functional level related to goals / functional outcomes: Minimal progress made d/t limited visits in setting of exacerbated dementia sx.    Remaining deficits: Cognitive communication impairment, dysarthria    Education / Equipment: HEP, memory book generation    Patient agrees to discharge. Patient goals were not met. Patient is being discharged due to a change in medical status. Pt will be transitioning to memory care unit. Suggest follow up with facility SLP for ongoing support for dementia and speech maintenance in setting of PD.   Harlene LITTIE Ned, CCC-SLP 07/13/2023, 2:52 PM

## 2023-07-13 NOTE — Patient Instructions (Signed)
 Where should I do Speak Out practice: Ask Denise [name], she may have recommendation Dining room in between meals Activity room if not being used Outside if it's nice out   If you can't find the practice on phone, call and ask Denise [name] for the link   Make sure you show Denise [name] your Speak Out book, tell her you do daily practice.   I recommend you try getting on caseload with [facility] SLP -- Routines, habits, environmental design to support your cognition  Developing and using your memory book  Speech practice -- using your intentional speech  It may be helpful for SLP to see our treatment notes-- they are in mychart  Can email with any questions   If you need us , ask PCP or neurologist for referral to come back and see us  here.

## 2023-07-14 NOTE — Progress Notes (Signed)
 Complex Care Management Care Guide Note  07/14/2023 Name: Syria Kestner MRN: [id number] DOB: 1951/03/21  Taneasha Fuqua is a 73 y.o. year old female who is a primary care patient of Georgina Speaks, FNP and is actively engaged with the care management team. I reached out to Rilla Hint by phone today to assist with scheduling  with the RN Case Manager.  Follow up plan: Unsuccessful telephone outreach attempt made.  [facility]  Care Coordination Care Guide  Direct Dial: [phone]

## 2023-07-15 ENCOUNTER — Other Ambulatory Visit: Payer: Self-pay | Admitting: Nurse Practitioner

## 2023-07-15 DIAGNOSIS — I2510 Atherosclerotic heart disease of native coronary artery without angina pectoris: Secondary | ICD-10-CM

## 2023-07-16 DIAGNOSIS — Z0000 Encounter for general adult medical examination without abnormal findings: Secondary | ICD-10-CM | POA: Diagnosis not present

## 2023-07-16 DIAGNOSIS — G20A1 Parkinson's disease without dyskinesia, without mention of fluctuations: Secondary | ICD-10-CM | POA: Diagnosis not present

## 2023-07-16 DIAGNOSIS — Y92121 Bathroom in nursing home as the place of occurrence of the external cause: Secondary | ICD-10-CM | POA: Diagnosis not present

## 2023-07-16 DIAGNOSIS — S0990XA Unspecified injury of head, initial encounter: Secondary | ICD-10-CM | POA: Diagnosis not present

## 2023-07-16 DIAGNOSIS — I6782 Cerebral ischemia: Secondary | ICD-10-CM | POA: Diagnosis not present

## 2023-07-16 DIAGNOSIS — M1990 Unspecified osteoarthritis, unspecified site: Secondary | ICD-10-CM | POA: Diagnosis not present

## 2023-07-16 DIAGNOSIS — I672 Cerebral atherosclerosis: Secondary | ICD-10-CM | POA: Diagnosis not present

## 2023-07-16 DIAGNOSIS — Z79899 Other long term (current) drug therapy: Secondary | ICD-10-CM | POA: Diagnosis not present

## 2023-07-16 DIAGNOSIS — Z880 Allergy status to penicillin: Secondary | ICD-10-CM | POA: Diagnosis not present

## 2023-07-16 DIAGNOSIS — W19XXXA Unspecified fall, initial encounter: Secondary | ICD-10-CM | POA: Diagnosis not present

## 2023-07-16 DIAGNOSIS — W01198A Fall on same level from slipping, tripping and stumbling with subsequent striking against other object, initial encounter: Secondary | ICD-10-CM | POA: Diagnosis not present

## 2023-07-17 ENCOUNTER — Other Ambulatory Visit (HOSPITAL_COMMUNITY): Payer: Self-pay

## 2023-07-17 ENCOUNTER — Telehealth: Payer: Self-pay | Admitting: Pharmacy Technician

## 2023-07-17 NOTE — Telephone Encounter (Signed)
 Pharmacy Patient Advocate Encounter   Received notification from CoverMyMeds that prior authorization for QUEtiapine  Fumarate 25MG  tablets is required/requested.   Insurance verification completed.   The patient is insured through [facility] .   Per test claim: PA required; PA submitted to above mentioned insurance via CoverMyMeds Key/confirmation #/EOC [id number] Status is pending

## 2023-07-18 ENCOUNTER — Other Ambulatory Visit: Payer: Self-pay | Admitting: Nurse Practitioner

## 2023-07-18 DIAGNOSIS — G20A1 Parkinson's disease without dyskinesia, without mention of fluctuations: Secondary | ICD-10-CM

## 2023-07-18 DIAGNOSIS — R296 Repeated falls: Secondary | ICD-10-CM

## 2023-07-19 ENCOUNTER — Ambulatory Visit: Payer: Medicare Other | Admitting: Neurology

## 2023-07-20 DIAGNOSIS — I10 Essential (primary) hypertension: Secondary | ICD-10-CM | POA: Diagnosis not present

## 2023-07-20 DIAGNOSIS — H409 Unspecified glaucoma: Secondary | ICD-10-CM | POA: Diagnosis not present

## 2023-07-20 DIAGNOSIS — R443 Hallucinations, unspecified: Secondary | ICD-10-CM | POA: Diagnosis not present

## 2023-07-20 DIAGNOSIS — K5900 Constipation, unspecified: Secondary | ICD-10-CM | POA: Diagnosis not present

## 2023-07-20 DIAGNOSIS — E559 Vitamin D deficiency, unspecified: Secondary | ICD-10-CM | POA: Diagnosis not present

## 2023-07-20 DIAGNOSIS — Z9181 History of falling: Secondary | ICD-10-CM | POA: Diagnosis not present

## 2023-07-20 DIAGNOSIS — G20A1 Parkinson's disease without dyskinesia, without mention of fluctuations: Secondary | ICD-10-CM | POA: Diagnosis not present

## 2023-07-20 DIAGNOSIS — R413 Other amnesia: Secondary | ICD-10-CM | POA: Diagnosis not present

## 2023-07-20 DIAGNOSIS — E538 Deficiency of other specified B group vitamins: Secondary | ICD-10-CM | POA: Diagnosis not present

## 2023-07-21 ENCOUNTER — Encounter: Payer: Self-pay | Admitting: Nurse Practitioner

## 2023-07-21 NOTE — Telephone Encounter (Signed)
Pharmacy Patient Advocate Encounter  Received notification from [facility]  that Prior Authorization for QUEtiapine Fumarate 25MG  tablets has been DENIED.  Full denial letter will be uploaded to the media tab. See denial reason below.  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 In this case, the dementia-related psychosis is not severe, nor does the member's behavior put themselves or others in danger.   PA #/Case ID/Reference #: [id number]

## 2023-07-24 NOTE — Telephone Encounter (Addendum)
Pt resides at a nursing facility.  Seen recently in ED for fall.  To f/u with pcp.  Noted seroquel denied.  Last note in 04/2023, daughter was informed could get with [employer]. At that time.  I do not see appt.

## 2023-07-24 NOTE — Progress Notes (Signed)
Complex Care Management Care Guide Note  07/24/2023 Name: Denise [name] MRN: [id number] DOB: [birth year]  Denise [name] is a 73 y.o. year old female who is a primary care patient of Denise Felts, Denise [name] and is actively engaged with the care management team. I reached out to Denise [name] by phone today to assist with scheduling  with the RN Case Manager.  Follow up plan: Telephone appointment with complex care management team member scheduled for:  08/03/23  Denise [name]  [facility]  [facility], [facility] Guide  Direct Dial: [phone]  Fax [phone]

## 2023-07-25 ENCOUNTER — Telehealth: Payer: Self-pay

## 2023-07-25 ENCOUNTER — Ambulatory Visit: Payer: Self-pay | Admitting: Licensed Clinical Social Worker

## 2023-07-25 NOTE — Telephone Encounter (Signed)
Physical Therapist Samson Frederic called stating that patient wanted a female occupational therapist and [facility] did not have one. Order for occupational therapist was cancelled.

## 2023-07-26 NOTE — Patient Outreach (Signed)
  Care Coordination   Follow Up Visit Note   07/25/2023 Name: Rosilee Nauert MRN: [id number] DOB: 12/15/1950  Daijia Gierke is a 73 y.o. year old female who sees [name], FNP for primary care. I spoke with  Gypsy Decant by phone today.  What matters to the patients health and wellness today?  Level of Care and Caregiver Stress    Goals Addressed             This Visit's Progress    Obtain Supportive Resources-Placement       Activities and task to complete in order to accomplish goals.   Keep all upcoming appointments discussed today Continue with compliance of taking medication prescribed by Doctor Implement healthy coping skills discussed to assist with management of symptoms F/up with Neurologist to discuss dementia concerns           SDOH assessments and interventions completed:  No     Care Coordination Interventions:  Yes, provided  Interventions Today    Flowsheet Row Most Recent Value  Chronic Disease   Chronic disease during today's visit Other  [PD]  General Interventions   General Interventions Discussed/Reviewed General Interventions Reviewed, Level of Care, Doctor Visits  Doctor Visits Discussed/Reviewed Doctor Visits Reviewed  Level of Care Assisted Living  [Pt has had difficulty adjusting to ALF in last 2 weeks. Daughter reports staff has called on multiple occassions to report observations of pt being confused or walking hallways at early hours.]  Mental Health Interventions   Mental Health Discussed/Reviewed Mental Health Reviewed, Coping Strategies, Anxiety  [Validation and encouragement provided for ongoing caregiver stress. Strategies discussed to assist with management of symptoms. Pt continues to participate in activities at facility to promote socialization]  Safety Interventions   Safety Discussed/Reviewed Safety Reviewed, Home Safety  [Strategies discussed to assist with improving safety and decreasing fall risk]  Advanced Directive  Interventions   Advanced Directives Discussed/Reviewed Advanced Directives Reviewed, Advanced Care Planning  [Family recently met with lawyer to modify Advanced Directives]       Follow up plan: Follow up call scheduled for 2-4 weeks    Encounter Outcome:  Patient Visit Completed   Jenel Lucks, LCSW [place]  [facility], [facility] Clinical Social Worker Direct Dial: [phone]  Fax: [phone] Website: [email or web address] 4:40 PM

## 2023-07-26 NOTE — Patient Instructions (Signed)
Visit Information  Thank you for taking time to visit with me today. Please don't hesitate to contact me if I can be of assistance to you.   Following are the goals we discussed today:   Goals Addressed             This Visit's Progress    Obtain Supportive Resources-Placement       Activities and task to complete in order to accomplish goals.   Keep all upcoming appointments discussed today Continue with compliance of taking medication prescribed by Doctor Implement healthy coping skills discussed to assist with management of symptoms F/up with Neurologist to discuss dementia concerns           Our next appointment is by telephone on 1/28 at 3 pm  Please call the care guide team at [phone] if you need to cancel or reschedule your appointment.   If you are experiencing a Mental Health or Behavioral Health Crisis or need someone to talk to, please call the Suicide and Crisis Lifeline: 988 call 911   Patient verbalizes understanding of instructions and care plan provided today and agrees to view in MyChart. Active MyChart status and patient understanding of how to access instructions and care plan via MyChart confirmed with patient.     Windy Fast [facility]  [facility], [facility] Clinical Social Worker Direct Dial: [phone]  Fax: [phone] Website: [email or web address] 4:40 PM

## 2023-07-27 DIAGNOSIS — E559 Vitamin D deficiency, unspecified: Secondary | ICD-10-CM | POA: Diagnosis not present

## 2023-07-27 DIAGNOSIS — Z9181 History of falling: Secondary | ICD-10-CM | POA: Diagnosis not present

## 2023-07-27 DIAGNOSIS — R443 Hallucinations, unspecified: Secondary | ICD-10-CM | POA: Diagnosis not present

## 2023-07-27 DIAGNOSIS — E538 Deficiency of other specified B group vitamins: Secondary | ICD-10-CM | POA: Diagnosis not present

## 2023-07-27 DIAGNOSIS — H409 Unspecified glaucoma: Secondary | ICD-10-CM | POA: Diagnosis not present

## 2023-07-27 DIAGNOSIS — K5900 Constipation, unspecified: Secondary | ICD-10-CM | POA: Diagnosis not present

## 2023-07-27 DIAGNOSIS — I10 Essential (primary) hypertension: Secondary | ICD-10-CM | POA: Diagnosis not present

## 2023-07-27 DIAGNOSIS — G20A1 Parkinson's disease without dyskinesia, without mention of fluctuations: Secondary | ICD-10-CM | POA: Diagnosis not present

## 2023-07-27 DIAGNOSIS — R413 Other amnesia: Secondary | ICD-10-CM | POA: Diagnosis not present

## 2023-07-28 DIAGNOSIS — G20A2 Parkinson's disease without dyskinesia, with fluctuations: Secondary | ICD-10-CM | POA: Diagnosis not present

## 2023-07-28 DIAGNOSIS — F02C0 Dementia in other diseases classified elsewhere, severe, without behavioral disturbance, psychotic disturbance, mood disturbance, and anxiety: Secondary | ICD-10-CM | POA: Diagnosis not present

## 2023-07-28 DIAGNOSIS — G20A1 Parkinson's disease without dyskinesia, without mention of fluctuations: Secondary | ICD-10-CM | POA: Diagnosis not present

## 2023-07-31 ENCOUNTER — Encounter: Payer: Self-pay | Admitting: Nurse Practitioner

## 2023-07-31 DIAGNOSIS — E559 Vitamin D deficiency, unspecified: Secondary | ICD-10-CM | POA: Diagnosis not present

## 2023-07-31 DIAGNOSIS — R413 Other amnesia: Secondary | ICD-10-CM | POA: Diagnosis not present

## 2023-07-31 DIAGNOSIS — G20A1 Parkinson's disease without dyskinesia, without mention of fluctuations: Secondary | ICD-10-CM | POA: Diagnosis not present

## 2023-07-31 DIAGNOSIS — H409 Unspecified glaucoma: Secondary | ICD-10-CM | POA: Diagnosis not present

## 2023-07-31 DIAGNOSIS — Z9181 History of falling: Secondary | ICD-10-CM | POA: Diagnosis not present

## 2023-07-31 DIAGNOSIS — I10 Essential (primary) hypertension: Secondary | ICD-10-CM | POA: Diagnosis not present

## 2023-07-31 DIAGNOSIS — R443 Hallucinations, unspecified: Secondary | ICD-10-CM | POA: Diagnosis not present

## 2023-07-31 DIAGNOSIS — E538 Deficiency of other specified B group vitamins: Secondary | ICD-10-CM | POA: Diagnosis not present

## 2023-07-31 DIAGNOSIS — K5900 Constipation, unspecified: Secondary | ICD-10-CM | POA: Diagnosis not present

## 2023-08-01 ENCOUNTER — Telehealth: Payer: Self-pay | Admitting: Licensed Clinical Social Worker

## 2023-08-01 ENCOUNTER — Encounter: Payer: Self-pay | Admitting: Licensed Clinical Social Worker

## 2023-08-01 NOTE — Patient Outreach (Signed)
  Care Coordination   08/01/2023 Name: Denise [name] MRN: [id number] DOB: [birth year]   Care Coordination Outreach Attempts:  An unsuccessful outreach was attempted for an appointment today.  Follow Up Plan:  Additional outreach attempts will be made to offer the patient complex care management information and services.   Encounter Outcome:  No Answer   Care Coordination Interventions:  No, not indicated     Jenel Lucks, LCSW [place]  [facility], [facility] Clinical Social Worker Direct Dial: [phone]  Fax: [phone] Website: [email or web address] 5:34 PM

## 2023-08-02 DIAGNOSIS — Z9181 History of falling: Secondary | ICD-10-CM | POA: Diagnosis not present

## 2023-08-02 DIAGNOSIS — G20A1 Parkinson's disease without dyskinesia, without mention of fluctuations: Secondary | ICD-10-CM | POA: Diagnosis not present

## 2023-08-02 DIAGNOSIS — K5900 Constipation, unspecified: Secondary | ICD-10-CM | POA: Diagnosis not present

## 2023-08-02 DIAGNOSIS — E559 Vitamin D deficiency, unspecified: Secondary | ICD-10-CM | POA: Diagnosis not present

## 2023-08-02 DIAGNOSIS — R443 Hallucinations, unspecified: Secondary | ICD-10-CM | POA: Diagnosis not present

## 2023-08-02 DIAGNOSIS — I10 Essential (primary) hypertension: Secondary | ICD-10-CM | POA: Diagnosis not present

## 2023-08-02 DIAGNOSIS — R413 Other amnesia: Secondary | ICD-10-CM | POA: Diagnosis not present

## 2023-08-02 DIAGNOSIS — E538 Deficiency of other specified B group vitamins: Secondary | ICD-10-CM | POA: Diagnosis not present

## 2023-08-02 DIAGNOSIS — H409 Unspecified glaucoma: Secondary | ICD-10-CM | POA: Diagnosis not present

## 2023-08-03 ENCOUNTER — Encounter: Payer: Self-pay | Admitting: Nurse Practitioner

## 2023-08-03 ENCOUNTER — Ambulatory Visit: Payer: Self-pay

## 2023-08-03 NOTE — Patient Outreach (Signed)
  Care Coordination   08/03/2023 Name: Denise [name] MRN: [id number] DOB: 08/07/1950   Care Coordination Outreach Attempts:  An unsuccessful outreach was attempted for an appointment today.  Follow Up Plan:  Additional outreach attempts will be made to offer the patient complex care management information and services.   Encounter Outcome:  No Answer   Care Coordination Interventions:  No, not indicated    Delsa Sale RN BSN CCM [place]  [facility], [facility] Nurse Care Coordinator  Direct Dial: [phone] Website: [email or web address]

## 2023-08-04 ENCOUNTER — Other Ambulatory Visit: Payer: Self-pay | Admitting: Nurse Practitioner

## 2023-08-04 DIAGNOSIS — G20A1 Parkinson's disease without dyskinesia, without mention of fluctuations: Secondary | ICD-10-CM

## 2023-08-04 NOTE — Addendum Note (Signed)
Addended by: Marlyn Corporal on: 08/04/2023 09:31 AM   Modules accepted: Orders

## 2023-08-07 ENCOUNTER — Other Ambulatory Visit: Payer: Self-pay

## 2023-08-07 DIAGNOSIS — I10 Essential (primary) hypertension: Secondary | ICD-10-CM | POA: Diagnosis not present

## 2023-08-07 DIAGNOSIS — E538 Deficiency of other specified B group vitamins: Secondary | ICD-10-CM | POA: Diagnosis not present

## 2023-08-07 DIAGNOSIS — K5900 Constipation, unspecified: Secondary | ICD-10-CM | POA: Diagnosis not present

## 2023-08-07 DIAGNOSIS — R443 Hallucinations, unspecified: Secondary | ICD-10-CM | POA: Diagnosis not present

## 2023-08-07 DIAGNOSIS — G20A1 Parkinson's disease without dyskinesia, without mention of fluctuations: Secondary | ICD-10-CM

## 2023-08-07 DIAGNOSIS — Z9181 History of falling: Secondary | ICD-10-CM | POA: Diagnosis not present

## 2023-08-07 DIAGNOSIS — H409 Unspecified glaucoma: Secondary | ICD-10-CM | POA: Diagnosis not present

## 2023-08-07 DIAGNOSIS — E559 Vitamin D deficiency, unspecified: Secondary | ICD-10-CM | POA: Diagnosis not present

## 2023-08-07 DIAGNOSIS — R413 Other amnesia: Secondary | ICD-10-CM | POA: Diagnosis not present

## 2023-08-09 ENCOUNTER — Encounter: Payer: Self-pay | Admitting: Nurse Practitioner

## 2023-08-09 DIAGNOSIS — G20A1 Parkinson's disease without dyskinesia, without mention of fluctuations: Secondary | ICD-10-CM | POA: Diagnosis not present

## 2023-08-09 DIAGNOSIS — E538 Deficiency of other specified B group vitamins: Secondary | ICD-10-CM | POA: Diagnosis not present

## 2023-08-09 DIAGNOSIS — E559 Vitamin D deficiency, unspecified: Secondary | ICD-10-CM | POA: Diagnosis not present

## 2023-08-09 DIAGNOSIS — K5900 Constipation, unspecified: Secondary | ICD-10-CM | POA: Diagnosis not present

## 2023-08-09 DIAGNOSIS — H409 Unspecified glaucoma: Secondary | ICD-10-CM | POA: Diagnosis not present

## 2023-08-09 DIAGNOSIS — R443 Hallucinations, unspecified: Secondary | ICD-10-CM | POA: Diagnosis not present

## 2023-08-09 DIAGNOSIS — I10 Essential (primary) hypertension: Secondary | ICD-10-CM | POA: Diagnosis not present

## 2023-08-09 DIAGNOSIS — R413 Other amnesia: Secondary | ICD-10-CM | POA: Diagnosis not present

## 2023-08-09 DIAGNOSIS — Z9181 History of falling: Secondary | ICD-10-CM | POA: Diagnosis not present

## 2023-08-09 LAB — URINE CULTURE

## 2023-08-10 ENCOUNTER — Encounter: Payer: Self-pay | Admitting: Physical Therapy

## 2023-08-10 DIAGNOSIS — K5900 Constipation, unspecified: Secondary | ICD-10-CM | POA: Diagnosis not present

## 2023-08-10 DIAGNOSIS — R443 Hallucinations, unspecified: Secondary | ICD-10-CM | POA: Diagnosis not present

## 2023-08-10 DIAGNOSIS — G20A1 Parkinson's disease without dyskinesia, without mention of fluctuations: Secondary | ICD-10-CM | POA: Diagnosis not present

## 2023-08-10 DIAGNOSIS — E559 Vitamin D deficiency, unspecified: Secondary | ICD-10-CM | POA: Diagnosis not present

## 2023-08-10 DIAGNOSIS — Z9181 History of falling: Secondary | ICD-10-CM | POA: Diagnosis not present

## 2023-08-10 DIAGNOSIS — E538 Deficiency of other specified B group vitamins: Secondary | ICD-10-CM | POA: Diagnosis not present

## 2023-08-10 DIAGNOSIS — I10 Essential (primary) hypertension: Secondary | ICD-10-CM | POA: Diagnosis not present

## 2023-08-10 DIAGNOSIS — R413 Other amnesia: Secondary | ICD-10-CM | POA: Diagnosis not present

## 2023-08-10 DIAGNOSIS — H409 Unspecified glaucoma: Secondary | ICD-10-CM | POA: Diagnosis not present

## 2023-08-14 ENCOUNTER — Encounter: Payer: Self-pay | Admitting: Nurse Practitioner

## 2023-08-14 ENCOUNTER — Ambulatory Visit: Payer: Medicare Other | Admitting: Nurse Practitioner

## 2023-08-14 ENCOUNTER — Ambulatory Visit (INDEPENDENT_AMBULATORY_CARE_PROVIDER_SITE_OTHER): Payer: Medicare Other | Admitting: Nurse Practitioner

## 2023-08-14 VITALS — BP 110/70 | HR 75 | Temp 98.5°F | Ht 66.0 in | Wt 124.2 lb

## 2023-08-14 DIAGNOSIS — G20A1 Parkinson's disease without dyskinesia, without mention of fluctuations: Secondary | ICD-10-CM | POA: Diagnosis not present

## 2023-08-14 DIAGNOSIS — R7303 Prediabetes: Secondary | ICD-10-CM | POA: Diagnosis not present

## 2023-08-14 DIAGNOSIS — Z79899 Other long term (current) drug therapy: Secondary | ICD-10-CM | POA: Diagnosis not present

## 2023-08-14 DIAGNOSIS — Z13220 Encounter for screening for lipoid disorders: Secondary | ICD-10-CM | POA: Diagnosis not present

## 2023-08-14 DIAGNOSIS — Z23 Encounter for immunization: Secondary | ICD-10-CM

## 2023-08-14 DIAGNOSIS — Z0000 Encounter for general adult medical examination without abnormal findings: Secondary | ICD-10-CM | POA: Diagnosis not present

## 2023-08-14 DIAGNOSIS — F03B2 Unspecified dementia, moderate, with psychotic disturbance: Secondary | ICD-10-CM | POA: Diagnosis not present

## 2023-08-14 DIAGNOSIS — R443 Hallucinations, unspecified: Secondary | ICD-10-CM | POA: Diagnosis not present

## 2023-08-14 MED ORDER — QUETIAPINE FUMARATE 25 MG PO TABS: ORAL_TABLET | ORAL | Status: DC

## 2023-08-14 NOTE — Progress Notes (Signed)
 Madelaine Bhat, CMA,acting as a Neurosurgeon for [name], FNP.,have documented all relevant documentation on the behalf of [name], FNP,as directed by  [name], FNP while in the presence of [name], FNP.  Subjective:    Patient ID: Denise [name] , female    DOB: 1950/11/10 , 73 y.o.   MRN: [id number]  Chief Complaint  Patient presents with   Annual Exam    HPI  Patient presents today for HM, Patient reports compliance with medication. Patient denies any chest pain, SOB, or headaches. Patient has no concerns today. She is now staying in [facility] in [facility]. She continues to do well. She did have one fall while in the Assisted living and hit her head - seen in ER had CT scan head and neck xray. She is in OT/PT/ST - once a week. She will walk around on her own with a friend she has met there at memory care. She will go out to the courtyard when it is warm. She is using her rolling walker when ambulating.     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     Family History  Problem Relation Age of Onset   Hypertension Mother    Heart disease Mother    Diabetes Father    Hypertension Father    Other Father        had a condition that was "a cousin" to ALS    Diabetes Sister    Tuberculosis Maternal Grandmother    Heart disease Sister    Colitis Daughter    Colon cancer Neg Hx    Colon polyps Neg Hx    Esophageal cancer Neg Hx    Rectal cancer Neg Hx    Stomach cancer Neg Hx      Current Outpatient Medications:    alendronate (FOSAMAX) 70 MG tablet, TAKE 1 TABLET (70 MG TOTAL) BY MOUTH EVERY 7 DAYS WITH FULL GLASS WATER ON EMPTY STOMACH, Disp: 12 tablet, Rfl: 1   atorvastatin (LIPITOR) 10 MG tablet, TAKE 1 TABLET BY MOUTH EVERY DAY AT BEDTIME MONDAY THROUGH FRIDAY, Disp: 65 tablet, Rfl: 4    Calcium Carb-Cholecalciferol (OYSTER SHELL CALCIUM/VITAMIN D) 500-200 MG-UNIT PACK, Take by mouth., Disp: , Rfl:    carbidopa-levodopa (SINEMET IR) 25-100 MG tablet, Take at 6am,12 and 6pm. Try to avoid protein. May take an extra during the day if needed., Disp: 360 tablet, Rfl: 4   dorzolamide-timolol (COSOPT) 2-0.5 % ophthalmic solution, Place 1 drop into the left eye 2 (two) times daily., Disp: , Rfl:    hydrochlorothiazide (HYDRODIURIL) 12.5 MG tablet, TAKE 1 TABLET BY MOUTH EVERY DAY AS NEEDED FOR LEG SWELLING AS DIRECTED, Disp: 90 tablet, Rfl: 1   ibuprofen (ADVIL) 800 MG tablet, Take 800 mg by mouth every 8 (eight) hours as needed., Disp: , Rfl:    Travoprost, BAK Free, (TRAVATAN) 0.004 % SOLN ophthalmic solution, 1 drop at bedtime., Disp: , Rfl:    Vitamin D, Ergocalciferol, (DRISDOL) 1.25 MG (50000 UNIT) CAPS capsule, Take 1 capsule (50,000 Units total) by mouth every 7 (seven) days., Disp: 12 capsule, Rfl: 0   QUEtiapine (SEROQUEL) 25 MG tablet, Take 1 tab in am and 2 tabs pm - Dr. Evern Bio, Disp: ,  Rfl:    Allergies  Allergen Reactions   Nystatin-Triamcinolone Swelling    BLISTERS   Triamcinolone Acetonide     Blisters    Cephalexin Itching and Rash    The patient states she uses post menopausal status for birth control. No LMP recorded. Patient is postmenopausal.  Negative for: breast discharge, breast lump(s), breast pain and breast self exam. Associated symptoms include abnormal vaginal bleeding. Pertinent negatives include abnormal bleeding (hematology), anxiety, decreased libido, depression, difficulty falling sleep, dyspareunia, history of infertility, nocturia, sexual dysfunction, sleep disturbances, urinary incontinence, urinary urgency, vaginal discharge and vaginal itching. Diet regular; she may or may not eat breakfast - not really a breakfast eater. The patient states her exercise level is minimal.   The patient's tobacco use is:  Social History   Tobacco Use  Smoking  Status Never  Smokeless Tobacco Never  . She has been exposed to passive smoke. The patient's alcohol use is:  Social History   Substance and Sexual Activity  Alcohol Use Never   Review of Systems  Constitutional: Negative.   HENT: Negative.    Eyes: Negative.   Respiratory: Negative.    Cardiovascular: Negative.   Gastrointestinal:  Negative for diarrhea, nausea and vomiting.  Endocrine: Negative.   Genitourinary: Negative.   Musculoskeletal: Negative.   Skin: Negative.   Allergic/Immunologic: Negative.   Neurological: Negative.   Hematological: Negative.   Psychiatric/Behavioral: Negative.       Today's Vitals   08/14/23 1518  BP: 110/70  Pulse: 75  Temp: 98.5 F (36.9 C)  TempSrc: Oral  Weight: 124 lb 3.2 oz (56.3 kg)  Height: 5\' 6"  (1.676 m)  PainSc: 0-No pain   Body mass index is 20.05 kg/m.  Wt Readings from Last 3 Encounters:  08/14/23 124 lb 3.2 oz (56.3 kg)  06/19/23 120 lb (54.4 kg)  06/15/23 121 lb 14.6 oz (55.3 kg)     Objective:  Physical Exam Vitals reviewed.  Constitutional:      General: She is not in acute distress.    Appearance: She is well-developed.     Comments: Thin appearance, hypophonic  HENT:     Head: Normocephalic and atraumatic.     Right Ear: Hearing, tympanic membrane, ear canal and external ear normal. There is no impacted cerumen.     Left Ear: Hearing, tympanic membrane, ear canal and external ear normal. There is no impacted cerumen.     Nose: Nose normal.     Mouth/Throat:     Mouth: Mucous membranes are moist.  Eyes:     General: Lids are normal.     Extraocular Movements: Extraocular movements intact.     Conjunctiva/sclera: Conjunctivae normal.     Pupils: Pupils are equal, round, and reactive to light.     Funduscopic exam:    Right eye: No papilledema.        Left eye: No papilledema.  Neck:     Thyroid: No thyroid mass.     Vascular: No carotid bruit.  Cardiovascular:     Rate and Rhythm: Normal rate and  regular rhythm.     Pulses: Normal pulses.     Heart sounds: Normal heart sounds. No murmur heard. Pulmonary:     Effort: Pulmonary effort is normal. No respiratory distress.     Breath sounds: Normal breath sounds. No wheezing.  Chest:     Chest wall: No mass.  Breasts:    Tanner Score is 5.     Right: Normal. No mass  or tenderness.     Left: Normal. No mass or tenderness.  Abdominal:     General: Abdomen is flat. Bowel sounds are normal. There is no distension.     Palpations: Abdomen is soft.     Tenderness: There is no abdominal tenderness.  Genitourinary:    Comments: Deferred  Musculoskeletal:        General: No swelling or tenderness. Normal range of motion.     Cervical back: Full passive range of motion without pain, normal range of motion and neck supple.     Right lower leg: No edema.     Left lower leg: No edema.     Comments: Uses rolling walker While sitting in chair she often leans to the left  Lymphadenopathy:     Upper Body:     Right upper body: No supraclavicular, axillary or pectoral adenopathy.     Left upper body: No supraclavicular, axillary or pectoral adenopathy.  Skin:    General: Skin is warm and dry.     Capillary Refill: Capillary refill takes less than 2 seconds.  Neurological:     General: No focal deficit present.     Mental Status: She is alert and oriented to person, place, and time.     Cranial Nerves: No cranial nerve deficit.     Sensory: No sensory deficit.     Motor: No weakness.  Psychiatric:        Mood and Affect: Mood normal.        Behavior: Behavior normal.        Thought Content: Thought content normal.        Judgment: Judgment normal.     Comments: Flat facial appearance         Assessment And Plan:     Encounter for annual health examination Assessment & Plan: Behavior modifications discussed and diet history reviewed.   Pt will continue to exercise regularly and modify diet with low GI, plant based foods and  decrease intake of processed foods.  Recommend intake of daily multivitamin, Vitamin D, and calcium.  Recommend mammogram and colonoscopy for preventive screenings, as well as recommend immunizations that include influenza, TDAP, and Shingles    Prediabetes Assessment & Plan: Hemoglobin A1c is stable.  Will check hemoglobin A1c today.  Diet controlled  Orders: -     Hemoglobin A1c  Parkinson's disease without dyskinesia, unspecified whether manifestations fluctuate (HCC) Assessment & Plan: Continue current medications and f/u with Neurology  Orders: -     CMP14+EGFR  COVID-19 vaccine administered Assessment & Plan: Covid 19 vaccine given in office observed for 15 minutes without any adverse reaction   Orders: -     Pfizer Comirnaty Covid-19 Vaccine 73yrs & older  Other long term (current) drug therapy -     CBC with Differential/Platelet  Encounter for screening for lipid disorder -     Lipid panel  Other orders -     QUEtiapine Fumarate; Take 1 tab in am and 2 tabs pm - Dr. Evern Bio    Return for 1 year physical : keep same next. Patient was given opportunity to ask questions. Patient verbalized understanding of the plan and was able to repeat key elements of the plan. All questions were answered to their satisfaction.   [name], FNP  I, [name], FNP, have reviewed all documentation for this visit. The documentation on 08/14/23 for the exam, diagnosis, procedures, and orders are all accurate and complete.

## 2023-08-14 NOTE — Patient Instructions (Addendum)
 You need to apply desitin to the buttocks once a day and as needed with soiling.   Health Maintenance  Topic Date Due   Mammogram  06/21/2022   Flu Shot  10/02/2023*   COVID-19 Vaccine (9 - 2024-25 season) 10/09/2023   Medicare Annual Wellness Visit  11/09/2023   Colon Cancer Screening  07/09/2025   DTaP/Tdap/Td vaccine (3 - Td or Tdap) 05/02/2027   Pneumonia Vaccine  Completed   DEXA scan (bone density measurement)  Completed   Hepatitis C Screening  Completed   Zoster (Shingles) Vaccine  Completed   HPV Vaccine  Aged Out  *Topic was postponed. The date shown is not the original due date.

## 2023-08-15 ENCOUNTER — Encounter: Payer: Self-pay | Admitting: Nurse Practitioner

## 2023-08-15 DIAGNOSIS — M1612 Unilateral primary osteoarthritis, left hip: Secondary | ICD-10-CM | POA: Diagnosis not present

## 2023-08-15 DIAGNOSIS — S4292XA Fracture of left shoulder girdle, part unspecified, initial encounter for closed fracture: Secondary | ICD-10-CM | POA: Diagnosis not present

## 2023-08-15 DIAGNOSIS — S42191A Fracture of other part of scapula, right shoulder, initial encounter for closed fracture: Secondary | ICD-10-CM | POA: Diagnosis not present

## 2023-08-15 DIAGNOSIS — M25552 Pain in left hip: Secondary | ICD-10-CM | POA: Diagnosis not present

## 2023-08-15 DIAGNOSIS — R0781 Pleurodynia: Secondary | ICD-10-CM | POA: Diagnosis not present

## 2023-08-15 DIAGNOSIS — S42125A Nondisplaced fracture of acromial process, left shoulder, initial encounter for closed fracture: Secondary | ICD-10-CM | POA: Diagnosis not present

## 2023-08-15 DIAGNOSIS — M79602 Pain in left arm: Secondary | ICD-10-CM | POA: Diagnosis not present

## 2023-08-15 DIAGNOSIS — F0390 Unspecified dementia without behavioral disturbance: Secondary | ICD-10-CM | POA: Diagnosis not present

## 2023-08-15 DIAGNOSIS — G20A1 Parkinson's disease without dyskinesia, without mention of fluctuations: Secondary | ICD-10-CM | POA: Diagnosis not present

## 2023-08-15 DIAGNOSIS — Z043 Encounter for examination and observation following other accident: Secondary | ICD-10-CM | POA: Diagnosis not present

## 2023-08-15 DIAGNOSIS — W1830XA Fall on same level, unspecified, initial encounter: Secondary | ICD-10-CM | POA: Diagnosis not present

## 2023-08-15 DIAGNOSIS — W0110XA Fall on same level from slipping, tripping and stumbling with subsequent striking against unspecified object, initial encounter: Secondary | ICD-10-CM | POA: Diagnosis not present

## 2023-08-15 DIAGNOSIS — M25512 Pain in left shoulder: Secondary | ICD-10-CM | POA: Diagnosis not present

## 2023-08-15 DIAGNOSIS — M19012 Primary osteoarthritis, left shoulder: Secondary | ICD-10-CM | POA: Diagnosis not present

## 2023-08-15 DIAGNOSIS — F0280 Dementia in other diseases classified elsewhere without behavioral disturbance: Secondary | ICD-10-CM | POA: Diagnosis not present

## 2023-08-15 LAB — CBC WITH DIFFERENTIAL/PLATELET
Basophils Absolute: 0 10*3/uL (ref 0.0–0.2)
Basos: 1 %
EOS (ABSOLUTE): 0.1 10*3/uL (ref 0.0–0.4)
Eos: 2 %
Hematocrit: 41 % (ref 34.0–46.6)
Hemoglobin: 12.9 g/dL (ref 11.1–15.9)
Immature Grans (Abs): 0 10*3/uL (ref 0.0–0.1)
Immature Granulocytes: 0 %
Lymphocytes Absolute: 1.7 10*3/uL (ref 0.7–3.1)
Lymphs: 36 %
MCH: 28.3 pg (ref 26.6–33.0)
MCHC: 31.5 g/dL (ref 31.5–35.7)
MCV: 90 fL (ref 79–97)
Monocytes Absolute: 0.4 10*3/uL (ref 0.1–0.9)
Monocytes: 8 %
Neutrophils Absolute: 2.5 10*3/uL (ref 1.4–7.0)
Neutrophils: 53 %
Platelets: 204 10*3/uL (ref 150–450)
RBC: 4.56 x10E6/uL (ref 3.77–5.28)
RDW: 12.7 % (ref 11.7–15.4)
WBC: 4.7 10*3/uL (ref 3.4–10.8)

## 2023-08-15 LAB — HEMOGLOBIN A1C
Est. average glucose Bld gHb Est-mCnc: 120 mg/dL
Hgb A1c MFr Bld: 5.8 % — ABNORMAL HIGH (ref 4.8–5.6)

## 2023-08-15 LAB — CMP14+EGFR
ALT: 10 [IU]/L (ref 0–32)
AST: 14 [IU]/L (ref 0–40)
Albumin: 4.6 g/dL (ref 3.8–4.8)
Alkaline Phosphatase: 61 [IU]/L (ref 44–121)
BUN/Creatinine Ratio: 13 (ref 12–28)
BUN: 13 mg/dL (ref 8–27)
Bilirubin Total: 0.5 mg/dL (ref 0.0–1.2)
CO2: 20 mmol/L (ref 20–29)
Calcium: 9.7 mg/dL (ref 8.7–10.3)
Chloride: 106 mmol/L (ref 96–106)
Creatinine, Ser: 0.99 mg/dL (ref 0.57–1.00)
Globulin, Total: 2.6 g/dL (ref 1.5–4.5)
Glucose: 85 mg/dL (ref 70–99)
Potassium: 4 mmol/L (ref 3.5–5.2)
Sodium: 144 mmol/L (ref 134–144)
Total Protein: 7.2 g/dL (ref 6.0–8.5)
eGFR: 61 mL/min/{1.73_m2} (ref >59)

## 2023-08-15 LAB — LIPID PANEL
Chol/HDL Ratio: 2.5 {ratio} (ref 0.0–4.4)
Cholesterol, Total: 158 mg/dL (ref 100–199)
HDL: 62 mg/dL (ref >39)
LDL Chol Calc (NIH): 79 mg/dL (ref 0–99)
Triglycerides: 89 mg/dL (ref 0–149)
VLDL Cholesterol Cal: 17 mg/dL (ref 5–40)

## 2023-08-16 ENCOUNTER — Encounter: Payer: Self-pay | Admitting: Nurse Practitioner

## 2023-08-17 ENCOUNTER — Telehealth: Payer: Self-pay

## 2023-08-17 ENCOUNTER — Telehealth: Payer: Medicare Other | Admitting: Nurse Practitioner

## 2023-08-17 DIAGNOSIS — Z09 Encounter for follow-up examination after completed treatment for conditions other than malignant neoplasm: Secondary | ICD-10-CM

## 2023-08-17 DIAGNOSIS — W19XXXD Unspecified fall, subsequent encounter: Secondary | ICD-10-CM | POA: Diagnosis not present

## 2023-08-17 DIAGNOSIS — S42125D Nondisplaced fracture of acromial process, left shoulder, subsequent encounter for fracture with routine healing: Secondary | ICD-10-CM | POA: Diagnosis not present

## 2023-08-17 DIAGNOSIS — R82998 Other abnormal findings in urine: Secondary | ICD-10-CM

## 2023-08-17 LAB — POCT URINALYSIS DIP (CLINITEK)
Glucose, UA: NEGATIVE mg/dL
Ketones, POC UA: NEGATIVE mg/dL
Nitrite, UA: NEGATIVE
POC PROTEIN,UA: 30 — AB
Spec Grav, UA: >=1.03 — AB (ref 1.010–1.025)
Urobilinogen, UA: 1 U/dL
pH, UA: 5.5 (ref 5.0–8.0)

## 2023-08-17 NOTE — Transitions of Care (Post Inpatient/ED Visit) (Signed)
   08/17/2023  Name: Denise [name] MRN: [id number] DOB: [birth year]  Today's TOC FU Call Status: Today's TOC FU Call Status:: Successful TOC FU Call Completed TOC FU Call Complete Date: 08/17/23 Patient's Name and Date of Birth confirmed.  Transition Care Management Follow-up Telephone Call Date of Discharge: 08/16/23 Discharge Facility: Other ([facility]) Name of Other ([facility]) Discharge Facility: [facility] Type of Discharge: Emergency Department Reason for ED Visit: Other: (Closed nondisplaced fracture of acromial process of left scapula, initial encounter) How have you been since you were released from the hospital?: Same Any questions or concerns?: No  Items Reviewed: Did you receive and understand the discharge instructions provided?: Yes Medications obtained,verified, and reconciled?: Yes (Medications Reviewed) Any new allergies since your discharge?: No Dietary orders reviewed?: NA Do you have support at home?: Yes People in Home: spouse Name of Support/Comfort Primary Source: Denise [name]  Medications Reviewed Today: Medications Reviewed Today   Medications were not reviewed in this encounter     Home Care and Equipment/Supplies: Were Home Health Services Ordered?: No Any new equipment or medical supplies ordered?: No  Functional Questionnaire: Do you need assistance with bathing/showering or dressing?: Yes Do you need assistance with meal preparation?: Yes Do you need assistance with eating?: Yes Do you have difficulty maintaining continence: Yes Do you need assistance with getting out of bed/getting out of a chair/moving?: Yes Do you have difficulty managing or taking your medications?: Yes  Follow up appointments reviewed: PCP Follow-up appointment confirmed?: No MD Provider Line Number:[phone] Given: Yes Specialist Hospital Follow-up appointment confirmed?: Yes Date of Specialist follow-up appointment?: 08/24/23 Follow-Up Specialty Provider::  ORTHO Do you need transportation to your follow-up appointment?: No Do you understand care options if your condition(s) worsen?: Yes-patient verbalized understanding    SIGNATURE Denise [name], CMA

## 2023-08-17 NOTE — Progress Notes (Signed)
 Virtual Visit via Video Note  Madelaine Bhat, CMA,acting as a scribe for [name], FNP.,have documented all relevant documentation on the behalf of [name], FNP,as directed by  [name], FNP while in the presence of [name], FNP.  I connected with Denise [name] on 09/01/23 at  3:40 PM EST by a video enabled telemedicine application and verified that I am speaking with the correct person using two identifiers.  Patient Location: Home Provider Location: Office  I discussed the limitations, risks, security, and privacy concerns of performing an evaluation and management service by video and the availability of in person appointments. I also discussed with the patient that there may be a patient responsible charge related to this service. The patient expressed understanding and agreed to proceed.  Subjective: PCP: [name], FNP  No chief complaint on file.  Patient presents today for a hospital follow up, Patient reports compliance with medication. Patient denies any chest pain, SOB, or headaches. Patient went to the hospital on 08/15/2023 after a fall, she tripped on her can.  The staff reported she was in the hall with her cane and she fell and hit her head. No visible injuries. When her daughter tried to put her coat on her left arm was hurting. Her daughter is concerned about the fracture and the type of fall she had. Unable to determine if she hurt it before in the same place. She has a sling and tylenol/oxycodone for pain. She has an appt on next Thursday at 330pm. She has to keep sling on at all times except for bathing. The patient is unable to remember what happened. Her daughter is not taking her back to [facility] due to her concerns of the fall, the need for her to require more assistance.   She has a Closed nondisplaced fracture of acromial process of left scapula. She has an appt with Orthopedics next week.   Patients daughter reports she would like her  urine checked due to the dark (brownish yellow) color in her urine, she does admit she does not drink enough water. Dgt noticed yesterday. Patient does have a follow up appointment with ortho Thursday.       Review of Systems  Constitutional: Negative.   Respiratory: Negative.    Cardiovascular: Negative.   Musculoskeletal:        Right arm/shoulder in sling  Neurological:  Negative for dizziness.  Psychiatric/Behavioral: Negative.       Current Outpatient Medications:    alendronate (FOSAMAX) 70 MG tablet, TAKE 1 TABLET (70 MG TOTAL) BY MOUTH EVERY 7 DAYS WITH FULL GLASS WATER ON EMPTY STOMACH, Disp: 12 tablet, Rfl: 1   atorvastatin (LIPITOR) 10 MG tablet, TAKE 1 TABLET BY MOUTH EVERY DAY AT BEDTIME MONDAY THROUGH FRIDAY, Disp: 65 tablet, Rfl: 4   Calcium Carb-Cholecalciferol (OYSTER SHELL CALCIUM/VITAMIN D) 500-200 MG-UNIT PACK, Take by mouth., Disp: , Rfl:    carbidopa-levodopa (SINEMET IR) 25-100 MG tablet, Take at 6am,12 and 6pm. Try to avoid protein. May take an extra during the day if needed., Disp: 360 tablet, Rfl: 4   dorzolamide-timolol (COSOPT) 2-0.5 % ophthalmic solution, Place 1 drop into the left eye 2 (two) times daily., Disp: , Rfl:    hydrochlorothiazide (HYDRODIURIL) 12.5 MG tablet, TAKE 1 TABLET BY MOUTH EVERY DAY AS NEEDED FOR LEG SWELLING AS DIRECTED, Disp: 90 tablet, Rfl: 1   ibuprofen (ADVIL) 800 MG tablet, Take 800 mg by mouth every 8 (eight) hours as needed., Disp: , Rfl:  QUEtiapine (SEROQUEL) 25 MG tablet, Take 1 tab in am and 2 tabs pm - Dr. Evern Bio, Disp: , Rfl:    Travoprost, BAK Free, (TRAVATAN) 0.004 % SOLN ophthalmic solution, 1 drop at bedtime., Disp: , Rfl:    Vitamin D, Ergocalciferol, (DRISDOL) 1.25 MG (50000 UNIT) CAPS capsule, Take 1 capsule (50,000 Units total) by mouth every 7 (seven) days., Disp: 12 capsule, Rfl: 0  Observations/Objective: There were no vitals filed for this visit. Physical Exam Vitals reviewed.  Constitutional:       General: She is not in acute distress.    Appearance: Normal appearance. She is well-developed.     Comments: Thin appearance  HENT:     Head: Normocephalic and atraumatic.  Eyes:     Pupils: Pupils are equal, round, and reactive to light.  Pulmonary:     Effort: Pulmonary effort is normal. No respiratory distress.  Musculoskeletal:     Comments: Limited range of motion to left arm  Neurological:     General: No focal deficit present.     Mental Status: She is alert and oriented to person, place, and time.     Cranial Nerves: No cranial nerve deficit.  Psychiatric:        Mood and Affect: Mood normal.    Assessment and Plan: Dark urine Assessment & Plan: Her daughter will bring her for a urine sample in the next few days. Encouraged to stay well hydrated with water.   Orders: -     POCT URINALYSIS DIP (CLINITEK) -     CMP14+EGFR -     Urine Culture  Closed nondisplaced fracture of acromial process of left scapula with routine healing, subsequent encounter Assessment & Plan: She has a f/u with Orthopedics next week.    Fall, subsequent encounter Assessment & Plan: She fell at the assisted living facility, possibly tripping over her cane. Seen in hospital ER.      Follow Up Instructions: Return for keep same next; lab visit on Tuesday 2p.   I discussed the assessment and treatment plan with the patient. The patient was provided an opportunity to ask questions, and all were answered. The patient agreed with the plan and demonstrated an understanding of the instructions.   The patient was advised to call back or seek an in-person evaluation if the symptoms worsen or if the condition fails to improve as anticipated.  The above assessment and management plan was discussed with the patient. The patient verbalized understanding of and has agreed to the management plan.   Jeanell Sparrow, FNP, have reviewed all documentation for this visit. The documentation on 08/17/23 for  the exam, diagnosis, procedures, and orders are all accurate and complete.

## 2023-08-19 LAB — URINE CULTURE

## 2023-08-21 ENCOUNTER — Encounter: Payer: Self-pay | Admitting: Neurology

## 2023-08-21 ENCOUNTER — Encounter: Payer: Self-pay | Admitting: Nurse Practitioner

## 2023-08-21 ENCOUNTER — Telehealth: Payer: Self-pay | Admitting: Licensed Clinical Social Worker

## 2023-08-21 NOTE — Patient Instructions (Signed)
Visit Information  Thank you for taking time to visit with me today. Please don't hesitate to contact me if I can be of assistance to you.   Following are the goals we discussed today:   Goals Addressed             This Visit's Progress    Obtain Supportive Resources-Placement   On track    Activities and task to complete in order to accomplish goals.   Keep all upcoming appointments discussed today Continue with compliance of taking medication prescribed by Doctor Implement healthy coping skills discussed to assist with management of symptoms Review resources discussed for in home aids to strengthen support in caring for pt in the home           Our next appointment is by telephone on 2/25 at 3:30 PM  Please call the care guide team at [phone] if you need to cancel or reschedule your appointment.   If you are experiencing a Mental Health or Behavioral Health Crisis or need someone to talk to, please call the Suicide and Crisis Lifeline: 988 call 911   Patient verbalizes understanding of instructions and care plan provided today and agrees to view in MyChart. Active MyChart status and patient understanding of how to access instructions and care plan via MyChart confirmed with patient.     Windy Fast [facility]  [facility], [facility] Clinical Social Worker Direct Dial: [phone]  Fax: [phone] Website: [email or web address] 4:09 PM

## 2023-08-21 NOTE — Patient Outreach (Signed)
  Care Coordination   Follow Up Visit Note   08/21/2023 Name: Denise [name] MRN: [id number] DOB: [birth year]  Denise [name] is a 73 y.o. year old female who sees [name], FNP for primary care. I spoke with  [name] daughter, [name], by phone today.  What matters to the patients health and wellness today?  Level of Care, Caregiver Stress, Symptom Management    Goals Addressed             This Visit's Progress    Obtain Supportive Resources-Placement   On track    Activities and task to complete in order to accomplish goals.   Keep all upcoming appointments discussed today Continue with compliance of taking medication prescribed by Doctor Implement healthy coping skills discussed to assist with management of symptoms Review resources discussed for in home aids to strengthen support in caring for pt in the home           SDOH assessments and interventions completed:  No     Care Coordination Interventions:  Yes, provided  Interventions Today    Flowsheet Row Most Recent Value  Chronic Disease   Chronic disease during today's visit Other  [Parkinson's Dementia]  General Interventions   General Interventions Discussed/Reviewed General Interventions Reviewed, [facility], Doctor Visits, Level of Care  Doctor Visits Discussed/Reviewed Doctor Visits Reviewed  Level of Care Personal Care Services, Assisted Living  [Daughter has moved pt from recent ALF. Pt is residing with adult daughter and receives occassional support from aunts with caring for pt. Has tours scheduled for new ALF[facility]. Family will f/up with LTC benefit insurance to inquire about services]  Mental Health Interventions   Mental Health Discussed/Reviewed Mental Health Reviewed, Coping Strategies  [Caregiver Stress and stress management strategies discussed]  Pharmacy Interventions   Pharmacy Dicussed/Reviewed Pharmacy Topics Reviewed, Medication Adherence  Safety Interventions    Safety Discussed/Reviewed Safety Reviewed, Fall Risk       Follow up plan: Follow up call scheduled for 2-4 weeks    Encounter Outcome:  Patient Visit Completed   Jenel Lucks, LCSW [place]  [facility], [facility] Clinical Social Worker Direct Dial: [phone]  Fax: [phone] Website: [email or web address] 4:08 PM

## 2023-08-22 ENCOUNTER — Other Ambulatory Visit: Payer: Medicare Other

## 2023-08-22 DIAGNOSIS — R82998 Other abnormal findings in urine: Secondary | ICD-10-CM | POA: Diagnosis not present

## 2023-08-23 LAB — CMP14+EGFR
ALT: 8 [IU]/L (ref 0–32)
AST: 12 [IU]/L (ref 0–40)
Albumin: 4.5 g/dL (ref 3.8–4.8)
Alkaline Phosphatase: 62 [IU]/L (ref 44–121)
BUN/Creatinine Ratio: 15 (ref 12–28)
BUN: 13 mg/dL (ref 8–27)
Bilirubin Total: 0.5 mg/dL (ref 0.0–1.2)
CO2: 28 mmol/L (ref 20–29)
Calcium: 9.3 mg/dL (ref 8.7–10.3)
Chloride: 105 mmol/L (ref 96–106)
Creatinine, Ser: 0.88 mg/dL (ref 0.57–1.00)
Globulin, Total: 2.4 g/dL (ref 1.5–4.5)
Glucose: 68 mg/dL — ABNORMAL LOW (ref 70–99)
Potassium: 4.4 mmol/L (ref 3.5–5.2)
Sodium: 144 mmol/L (ref 134–144)
Total Protein: 6.9 g/dL (ref 6.0–8.5)
eGFR: 70 mL/min/{1.73_m2} (ref >59)

## 2023-08-27 DIAGNOSIS — Z23 Encounter for immunization: Secondary | ICD-10-CM | POA: Insufficient documentation

## 2023-08-27 DIAGNOSIS — Z0000 Encounter for general adult medical examination without abnormal findings: Secondary | ICD-10-CM | POA: Insufficient documentation

## 2023-08-27 NOTE — Assessment & Plan Note (Signed)
 Covid 19 vaccine given in office observed for 15 minutes without any adverse reaction

## 2023-08-27 NOTE — Assessment & Plan Note (Signed)

## 2023-08-27 NOTE — Assessment & Plan Note (Signed)
 Continue current medications and f/u with Neurology

## 2023-08-27 NOTE — Assessment & Plan Note (Signed)
 Hemoglobin A1c is stable.  Will check hemoglobin A1c today.  Diet controlled

## 2023-08-28 ENCOUNTER — Encounter: Payer: Self-pay | Admitting: Nurse Practitioner

## 2023-08-28 DIAGNOSIS — S42125A Nondisplaced fracture of acromial process, left shoulder, initial encounter for closed fracture: Secondary | ICD-10-CM | POA: Diagnosis not present

## 2023-08-28 DIAGNOSIS — S4992XA Unspecified injury of left shoulder and upper arm, initial encounter: Secondary | ICD-10-CM | POA: Diagnosis not present

## 2023-08-29 ENCOUNTER — Ambulatory Visit: Payer: Self-pay | Admitting: Licensed Clinical Social Worker

## 2023-08-31 NOTE — Patient Outreach (Signed)
 Care Coordination   Follow Up Visit Note   08/29/2023 Name: Krystelle Prashad MRN: [id number] DOB: Apr 26, 1951  Ramiyah Mcclenahan is a 73 y.o. year old female who sees [name], FNP for primary care. I spoke with  [name] daughter by phone today.  What matters to the patients health and wellness today?  Level of Care, Symptom Management    Goals Addressed             This Visit's Progress    Obtain Supportive Resources-Placement   On track    Activities and task to complete in order to accomplish goals.   Keep all upcoming appointments discussed today Continue with compliance of taking medication prescribed by Doctor Implement healthy coping skills discussed to assist with management of symptoms Review resources discussed for in home aids to strengthen support in caring for pt in the home F/up with referral to [facility] ([facility])            SDOH assessments and interventions completed:  No     Care Coordination Interventions:  Yes, provided  Interventions Today    Flowsheet Row Most Recent Value  Chronic Disease   Chronic disease during today's visit Other  [Parkinson's Dementia]  General Interventions   General Interventions Discussed/Reviewed General Interventions Reviewed, Doctor Visits, Level of Care  Doctor Visits Discussed/Reviewed Doctor Visits Reviewed  Level of Care Applications  Applications Other  [Family is following up with LTC insurance provider. Forms are in process of being completed and submitted from specialist/pcp office]  Education Interventions   Applications Other  [Family is following up with LTC insurance provider. Forms are in process of being completed and submitted from specialist/pcp office]  Mental Health Interventions   Mental Health Discussed/Reviewed Mental Health Reviewed, Coping Strategies  Safety Interventions   Safety Discussed/Reviewed Safety Reviewed  Advanced Directive Interventions    Advanced Directives Discussed/Reviewed End of Life  End of Life Palliative     ' Follow up plan: Follow up call scheduled for 2-4 weeks    Encounter Outcome:  Patient Visit Completed   Jenel Lucks, LCSW [place]  [facility], [facility] Clinical Social Worker Direct Dial: [phone]  Fax: [phone] Website: [email or web address] 6:28 PM

## 2023-08-31 NOTE — Patient Instructions (Signed)
 Visit Information  Thank you for taking time to visit with me today. Please don't hesitate to contact me if I can be of assistance to you.   Following are the goals we discussed today:   Goals Addressed             This Visit's Progress    Obtain Supportive Resources-Placement   On track    Activities and task to complete in order to accomplish goals.   Keep all upcoming appointments discussed today Continue with compliance of taking medication prescribed by Doctor Implement healthy coping skills discussed to assist with management of symptoms Review resources discussed for in home aids to strengthen support in caring for pt in the home F/up with referral to [facility] ([facility])            Our next appointment is by telephone on 3/25 at 3:30 PM  Please call the care guide team at [phone] if you need to cancel or reschedule your appointment.   If you are experiencing a Mental Health or Behavioral Health Crisis or need someone to talk to, please call the Suicide and Crisis Lifeline: 988 call 911   Patient verbalizes understanding of instructions and care plan provided today and agrees to view in MyChart. Active MyChart status and patient understanding of how to access instructions and care plan via MyChart confirmed with patient.     Windy Fast [facility]  [facility], [facility] Clinical Social Worker Direct Dial: [phone]  Fax: [phone] Website: [email or web address] 6:28 PM

## 2023-09-01 ENCOUNTER — Encounter: Payer: Self-pay | Admitting: Nurse Practitioner

## 2023-09-01 DIAGNOSIS — R82998 Other abnormal findings in urine: Secondary | ICD-10-CM | POA: Insufficient documentation

## 2023-09-01 DIAGNOSIS — S42125D Nondisplaced fracture of acromial process, left shoulder, subsequent encounter for fracture with routine healing: Secondary | ICD-10-CM | POA: Insufficient documentation

## 2023-09-01 NOTE — Assessment & Plan Note (Signed)
 She has a f/u with Orthopedics next week.

## 2023-09-01 NOTE — Assessment & Plan Note (Signed)
 She fell at the assisted living facility, possibly tripping over her cane. Seen in hospital ER.

## 2023-09-01 NOTE — Assessment & Plan Note (Signed)
 Her daughter will bring her for a urine sample in the next few days. Encouraged to stay well hydrated with water.

## 2023-09-04 ENCOUNTER — Telehealth: Payer: Self-pay | Admitting: *Deleted

## 2023-09-04 NOTE — Progress Notes (Signed)
 Complex Care Management Care Guide Note  09/04/2023 Name: Rickiya Picariello MRN: [id number] DOB: June 09, 1951  Jenisa Monty is a 73 y.o. year old female who is a primary care patient of [name], FNP and is actively engaged with the care management team. I reached out to Gypsy Decant by phone today to assist with re-scheduling  with the RN Case Manager.  Follow up plan: Unsuccessful telephone outreach attempt made. A HIPAA compliant phone message was left for the patient providing contact information and requesting a return call.  Gwenevere Ghazi  [facility]  [facility], [facility] Guide  Direct Dial: [phone]  Fax [phone]

## 2023-09-05 ENCOUNTER — Other Ambulatory Visit: Payer: Self-pay | Admitting: Nurse Practitioner

## 2023-09-05 DIAGNOSIS — G20A1 Parkinson's disease without dyskinesia, without mention of fluctuations: Secondary | ICD-10-CM

## 2023-09-11 NOTE — Progress Notes (Signed)
 Complex Care Management Care Guide Note  09/11/2023 Name: Denise [name] MRN: [id number] DOB: 1951/02/06  Denise [name] is a 73 y.o. year old female who is a primary care patient of [name], FNP and is actively engaged with the care management team. I reached out to Gypsy Decant by phone today to assist with re-scheduling  with the RN Case Manager.  Follow up plan: Unsuccessful telephone outreach attempt made. A HIPAA compliant phone message was left for the patient providing contact information and requesting a return call.No further outreach attempts will be made at this time. We have been unable to contact the patient to reschedule for complex care management services.   Gwenevere Ghazi  [facility]  Value-Based Care Institute, [facility] Guide  Direct Dial: [phone]  Fax [phone]

## 2023-09-12 DIAGNOSIS — Z1231 Encounter for screening mammogram for malignant neoplasm of breast: Secondary | ICD-10-CM | POA: Diagnosis not present

## 2023-09-12 LAB — HM MAMMOGRAPHY

## 2023-09-19 DIAGNOSIS — B351 Tinea unguium: Secondary | ICD-10-CM | POA: Diagnosis not present

## 2023-09-19 DIAGNOSIS — L84 Corns and callosities: Secondary | ICD-10-CM | POA: Diagnosis not present

## 2023-09-19 DIAGNOSIS — M79676 Pain in unspecified toe(s): Secondary | ICD-10-CM | POA: Diagnosis not present

## 2023-09-19 DIAGNOSIS — I70203 Unspecified atherosclerosis of native arteries of extremities, bilateral legs: Secondary | ICD-10-CM | POA: Diagnosis not present

## 2023-09-26 ENCOUNTER — Ambulatory Visit: Payer: Self-pay | Admitting: Licensed Clinical Social Worker

## 2023-09-27 NOTE — Patient Outreach (Signed)
 Care Coordination   Follow Up Visit Note   09/26/2023 Name: Denise [name] MRN: [id number] DOB: 1950-11-05  Denise [name] is a 73 y.o. year old female who sees Denise Felts, FNP for primary care. I spoke with  Denise [name] daughter, Denise [name], by phone today.  What matters to the patients health and wellness today?  Symptom Management, Caregiver Stress, Level of Care    Goals Addressed             This Visit's Progress    Obtain Supportive Resources-Placement   On track    Activities and task to complete in order to accomplish goals.   Keep all upcoming appointments discussed today Continue with compliance of taking medication prescribed by Doctor Implement healthy coping skills discussed to assist with management of symptoms Review resources discussed for in home aids to strengthen support in caring for pt in the home F/up with referral to [facility] ([facility])            SDOH assessments and interventions completed:  No     Care Coordination Interventions:  Yes, provided  Interventions Today    Flowsheet Row Most Recent Value  Chronic Disease   Chronic disease during today's visit Other  [PD]  General Interventions   General Interventions Discussed/Reviewed General Interventions Reviewed, Community Resources, Level of Care, Doctor Visits  Doctor Visits Discussed/Reviewed Doctor Visits Reviewed  Level of Care Assisted Living, Personal Care Services  [Pt has been approved for aid services (45) prior to paying self-pay]  Mental Health Interventions   Mental Health Discussed/Reviewed Mental Health Reviewed, Coping Strategies  [Stress management strategies discussed to assist with caregiver stress and self-care]  Safety Interventions   Safety Discussed/Reviewed Safety Reviewed       Follow up plan: Follow up call scheduled for 2-4 weeks    Encounter Outcome:  Patient Visit Completed   Jenel Lucks, LCSW [place]   [facility], [facility] Clinical Social Worker Direct Dial: [phone]  Fax: [phone] Website: [email or web address] 5:00 PM

## 2023-09-27 NOTE — Patient Instructions (Signed)
 Visit Information  Thank you for taking time to visit with me today. Please don't hesitate to contact me if I can be of assistance to you.   Following are the goals we discussed today:   Goals Addressed             This Visit's Progress    Obtain Supportive Resources-Placement   On track    Activities and task to complete in order to accomplish goals.   Keep all upcoming appointments discussed today Continue with compliance of taking medication prescribed by Doctor Implement healthy coping skills discussed to assist with management of symptoms Review resources discussed for in home aids to strengthen support in caring for pt in the home F/up with referral to [facility] ([facility])            Our next appointment is by telephone on 4/22 at 3:30 PM  Please call the care guide team at [phone] if you need to cancel or reschedule your appointment.   If you are experiencing a Mental Health or Behavioral Health Crisis or need someone to talk to, please call the Suicide and Crisis Lifeline: 988 call 911   Patient verbalizes understanding of instructions and care plan provided today and agrees to view in MyChart. Active MyChart status and patient understanding of how to access instructions and care plan via MyChart confirmed with patient.     Windy Fast [facility]  [facility], [facility] Clinical Social Worker Direct Dial: [phone]  Fax: [phone] Website: [email or web address] 5:00 PM

## 2023-10-02 DIAGNOSIS — G3184 Mild cognitive impairment, so stated: Secondary | ICD-10-CM | POA: Diagnosis not present

## 2023-10-02 DIAGNOSIS — F419 Anxiety disorder, unspecified: Secondary | ICD-10-CM | POA: Diagnosis not present

## 2023-10-03 DIAGNOSIS — G20C Parkinsonism, unspecified: Secondary | ICD-10-CM | POA: Diagnosis not present

## 2023-10-03 DIAGNOSIS — F02A0 Dementia in other diseases classified elsewhere, mild, without behavioral disturbance, psychotic disturbance, mood disturbance, and anxiety: Secondary | ICD-10-CM | POA: Diagnosis not present

## 2023-10-03 DIAGNOSIS — Z515 Encounter for palliative care: Secondary | ICD-10-CM | POA: Diagnosis not present

## 2023-10-11 ENCOUNTER — Encounter: Payer: Self-pay | Admitting: Nurse Practitioner

## 2023-10-16 ENCOUNTER — Other Ambulatory Visit: Payer: Self-pay | Admitting: Nurse Practitioner

## 2023-10-16 DIAGNOSIS — Z111 Encounter for screening for respiratory tuberculosis: Secondary | ICD-10-CM | POA: Diagnosis not present

## 2023-10-16 MED ORDER — QUETIAPINE FUMARATE 25 MG PO TABS: ORAL_TABLET | ORAL | Status: DC

## 2023-10-16 MED ORDER — CARBIDOPA-LEVODOPA 25-100 MG PO TABS: ORAL_TABLET | ORAL | Status: DC

## 2023-10-17 ENCOUNTER — Other Ambulatory Visit: Payer: Self-pay | Admitting: Nurse Practitioner

## 2023-10-18 ENCOUNTER — Other Ambulatory Visit: Payer: Self-pay | Admitting: Nurse Practitioner

## 2023-10-18 ENCOUNTER — Encounter: Payer: Self-pay | Admitting: Nurse Practitioner

## 2023-10-18 DIAGNOSIS — R5381 Other malaise: Secondary | ICD-10-CM | POA: Diagnosis not present

## 2023-10-18 DIAGNOSIS — M25559 Pain in unspecified hip: Secondary | ICD-10-CM | POA: Diagnosis not present

## 2023-10-18 DIAGNOSIS — M79661 Pain in right lower leg: Secondary | ICD-10-CM | POA: Diagnosis not present

## 2023-10-18 DIAGNOSIS — M79604 Pain in right leg: Secondary | ICD-10-CM | POA: Diagnosis not present

## 2023-10-18 DIAGNOSIS — Z79899 Other long term (current) drug therapy: Secondary | ICD-10-CM | POA: Diagnosis not present

## 2023-10-18 DIAGNOSIS — G20A1 Parkinson's disease without dyskinesia, without mention of fluctuations: Secondary | ICD-10-CM | POA: Diagnosis not present

## 2023-10-18 DIAGNOSIS — F0280 Dementia in other diseases classified elsewhere without behavioral disturbance: Secondary | ICD-10-CM | POA: Diagnosis not present

## 2023-10-18 DIAGNOSIS — M1611 Unilateral primary osteoarthritis, right hip: Secondary | ICD-10-CM | POA: Diagnosis not present

## 2023-10-18 DIAGNOSIS — M1990 Unspecified osteoarthritis, unspecified site: Secondary | ICD-10-CM | POA: Diagnosis not present

## 2023-10-18 DIAGNOSIS — Z7983 Long term (current) use of bisphosphonates: Secondary | ICD-10-CM | POA: Diagnosis not present

## 2023-10-18 DIAGNOSIS — Z111 Encounter for screening for respiratory tuberculosis: Secondary | ICD-10-CM | POA: Diagnosis not present

## 2023-10-18 DIAGNOSIS — M79606 Pain in leg, unspecified: Secondary | ICD-10-CM | POA: Diagnosis not present

## 2023-10-18 DIAGNOSIS — M810 Age-related osteoporosis without current pathological fracture: Secondary | ICD-10-CM | POA: Diagnosis not present

## 2023-10-18 MED ORDER — CARBIDOPA-LEVODOPA 25-100 MG PO TABS: ORAL_TABLET | ORAL | Status: AC

## 2023-10-19 ENCOUNTER — Other Ambulatory Visit: Payer: Self-pay | Admitting: Nurse Practitioner

## 2023-10-19 MED ORDER — ACETAMINOPHEN ER 650 MG PO TBCR: 650.0000 mg | EXTENDED_RELEASE_TABLET | Freq: Three times a day (TID) | ORAL | 2 refills | Status: DC | PRN

## 2023-10-22 ENCOUNTER — Encounter: Payer: Self-pay | Admitting: Nurse Practitioner

## 2023-10-24 ENCOUNTER — Encounter: Payer: Self-pay | Admitting: Licensed Clinical Social Worker

## 2023-10-24 ENCOUNTER — Encounter: Payer: Self-pay | Admitting: Nurse Practitioner

## 2023-10-24 ENCOUNTER — Ambulatory Visit (INDEPENDENT_AMBULATORY_CARE_PROVIDER_SITE_OTHER): Admitting: Nurse Practitioner

## 2023-10-24 VITALS — BP 110/60 | HR 59 | Temp 98.5°F | Ht 66.0 in | Wt 127.2 lb

## 2023-10-24 DIAGNOSIS — G20A1 Parkinson's disease without dyskinesia, without mention of fluctuations: Secondary | ICD-10-CM | POA: Diagnosis not present

## 2023-10-24 DIAGNOSIS — M79604 Pain in right leg: Secondary | ICD-10-CM

## 2023-10-24 DIAGNOSIS — F32A Depression, unspecified: Secondary | ICD-10-CM

## 2023-10-24 DIAGNOSIS — F419 Anxiety disorder, unspecified: Secondary | ICD-10-CM | POA: Insufficient documentation

## 2023-10-24 DIAGNOSIS — Z09 Encounter for follow-up examination after completed treatment for conditions other than malignant neoplasm: Secondary | ICD-10-CM | POA: Insufficient documentation

## 2023-10-24 NOTE — Progress Notes (Signed)
 [place], CMA,acting as a Neurosurgeon for Denise Epley, FNP.,have documented all relevant documentation on the behalf of Denise Epley, FNP,as directed by  Denise Epley, FNP while in the presence of Denise Epley, FNP.  Subjective:  Patient ID: Denise [name] , female    DOB: [birth year] , 73 y.o.   MRN: [id number]  Chief Complaint  Patient presents with   Hospitalization Follow-up    HPI  Patient presents today for a hospital follow up, patient reports compliance with medications. Patient denies any chest pain, SOB, or headaches. Patient went to ER 10/18/2023. Patient went for right leg pain. Had an ultrasound everything was normal. Urine culture was done.   Patient reports today they are feeling  Patient is also having some concerns about anxiety, mood and behavior changes. She will say she needs help and will panic. She is unable to get there quick enough. The episodes last approximately one hour. Occurring more often and more in the morning. She is due to see the Neurologist tomorrow. Denise [name](patient daughter) reports many mornings she is yelling for help and it is nothing going on. She reports she is having a lot of panic attacks. Dr. Johnnie Nan at [facility].  She reports they need orders for Desitin to her buttocks where there are 2 red areas. For her compression socks and to check her legs, tylenol  650mg , and a order to check her blood pressure before taking her hydrochlorothiazide    She had respite at [facility] - she is planning to remain there for her care.  She would like to see a therapist.      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 (Patient not taking: Reported on 10/26/2023), Disp: 60 tablet, Rfl: 2   alendronate  (FOSAMAX ) 70 MG tablet, TAKE 1 TABLET (70 MG TOTAL) BY MOUTH EVERY 7 DAYS WITH FULL GLASS WATER ON EMPTY STOMACH, Disp: 12 tablet, Rfl: 1   atorvastatin  (LIPITOR) 10 MG tablet, TAKE 1 TABLET BY MOUTH EVERY DAY AT BEDTIME MONDAY THROUGH FRIDAY, Disp: 65 tablet, Rfl: 4   Calcium  Carb-Cholecalciferol (OYSTER SHELL CALCIUM /VITAMIN D ) 500-200 MG-UNIT PACK, Take by mouth., Disp: , Rfl:    carbidopa -levodopa  (SINEMET  IR) 25-100 MG tablet, Take 1 tablet by mouth at 8am,12pm, 4pm and 8pm. Try to avoid protein. May take an extra during the day if needed., Disp: , Rfl:    dorzolamide-timolol (COSOPT) 2-0.5 % ophthalmic  solution, Place 1 drop into the left eye 2 (two) times daily., Disp: , Rfl:    hydrochlorothiazide  (HYDRODIURIL ) 12.5 MG tablet, TAKE 1 TABLET BY MOUTH EVERY DAY AS NEEDED FOR LEG SWELLING AS DIRECTED, Disp: 90 tablet, Rfl: 1   QUEtiapine  (SEROQUEL ) 25 MG tablet, Take 1 tab in am and 2 tabs pm - Dr. Jeneane Miracle, Disp: , Rfl:    Travoprost, BAK Free, (TRAVATAN) 0.004 % SOLN ophthalmic solution, 1 drop at bedtime., Disp: , Rfl:    Vitamin D , Ergocalciferol , (DRISDOL ) 1.25 MG (50000 UNIT) CAPS capsule, TAKE 1 CAPSULE (50,000 UNITS TOTAL) BY MOUTH EVERY 7 (SEVEN) DAYS, Disp: 12 capsule, Rfl: 0   Allergies  Allergen Reactions   Nystatin -Triamcinolone  Swelling    BLISTERS   Triamcinolone  Acetonide     Blisters    Cephalexin  Itching and Rash     Review of Systems   Constitutional: Negative.   Respiratory: Negative.    Cardiovascular: Negative.   Gastrointestinal: Negative.   Neurological: Negative.   Psychiatric/Behavioral: Negative.       Today's Vitals   10/24/23 1610  BP: 110/60  Pulse: (!) 59  Temp: 98.5 F (36.9 C)  TempSrc: Oral  Weight: 127 lb 3.2 oz (57.7 kg)  Height: 5\' 6"  (1.676 m)  PainSc: 0-No pain   Body mass index is 20.53 kg/m.  Wt Readings from Last 3 Encounters:  10/24/23 127 lb 3.2 oz (57.7 kg)  08/14/23 124 lb 3.2 oz (56.3 kg)  06/19/23 120 lb (54.4 kg)       10/24/2023    4:10 PM 08/14/2023    3:13 PM 03/19/2021    1:29 PM  GAD 7 : Generalized Anxiety Score  Nervous, Anxious, on Edge 3 0 0  Control/stop worrying 1 0 0  Worry too much - different things 1 0 0  Trouble relaxing 3 0 0  Restless 0 0 0  Easily annoyed or irritable 3 0 0  Afraid - awful might happen 0 0 0  Total GAD 7 Score 11 0 0  Anxiety Difficulty Very difficult Not difficult at all Not difficult at all       10/24/2023    4:12 PM 08/14/2023    3:13 PM 11/09/2022    2:43 PM 08/10/2022   11:21 AM 04/05/2022    4:07 PM  Depression screen PHQ 2/9  Decreased Interest 0 0 0 0 0  Down, Depressed, Hopeless 2 0 0 0 0  PHQ - 2 Score 2 0 0 0 0  Altered sleeping 1 0     Tired, decreased energy 1 0     Change in appetite 0 0     Feeling bad or failure about yourself  1 0     Trouble concentrating 0 0     Moving slowly or fidgety/restless 0 0     Suicidal thoughts 0 0     PHQ-9 Score 5 0     Difficult doing work/chores  Not difficult at all        Objective:  Physical Exam Vitals reviewed.  Constitutional:      General: She is not in acute distress.    Appearance: Normal appearance. She is well-developed.     Comments: Thin appearance  HENT:     Head: Normocephalic and atraumatic.  Eyes:     Pupils: Pupils are equal, round, and reactive to light.  Pulmonary:     Effort: Pulmonary effort is normal. No respiratory distress.  Skin:     General:  Skin is warm and dry.     Capillary Refill: Capillary refill takes less than 2 seconds.  Neurological:     General: No focal deficit present.     Mental Status: She is alert and oriented to person, place, and time.     Cranial Nerves: No cranial nerve deficit.     Motor: No weakness.  Psychiatric:        Mood and Affect: Mood normal.        Behavior: Behavior is cooperative.        Thought Content: Thought content normal.        Cognition and Memory: She exhibits impaired recent memory (at times).        Judgment: Judgment is inappropriate (at times).         Assessment And Plan:  Hospital discharge follow-up  Right leg pain Assessment & Plan: Seen at ER. Ultrasound done normal. Not having pain at this time.    Parkinson's disease without dyskinesia, unspecified whether manifestations fluctuate (HCC) Assessment & Plan: She is having behavioral changes, she does have an appt with Neurology in the next upcoming days. However will refer to neuropsych to evaluate further.   Orders: -     Ambulatory referral to Neuropsychology  Anxiety and depression Assessment & Plan: She is having more episodes of depression and periods of anxiety and may need changes with her medications. Will refer to neuropsych to also help with therapy. Anxiety score 11. Depression score 5   Orders: -     Ambulatory referral to Neuropsychology    Return in about 4 months (around 02/23/2024).  Patient was given opportunity to ask questions. Patient verbalized understanding of the plan and was able to repeat key elements of the plan. All questions were answered to their satisfaction.    Inge Mangle, FNP, have reviewed all documentation for this visit. The documentation on 10/24/23 for the exam, diagnosis, procedures, and orders are all accurate and complete.   IF YOU HAVE BEEN REFERRED TO A SPECIALIST, IT MAY TAKE 1-2 WEEKS TO SCHEDULE/PROCESS THE REFERRAL. IF YOU HAVE NOT HEARD FROM  US /SPECIALIST IN TWO WEEKS, PLEASE GIVE US  A CALL AT [phone].

## 2023-10-25 ENCOUNTER — Telehealth: Payer: Self-pay | Admitting: Nurse Practitioner

## 2023-10-25 DIAGNOSIS — G20A2 Parkinson's disease without dyskinesia, with fluctuations: Secondary | ICD-10-CM | POA: Diagnosis not present

## 2023-10-25 DIAGNOSIS — R443 Hallucinations, unspecified: Secondary | ICD-10-CM | POA: Diagnosis not present

## 2023-10-25 NOTE — Telephone Encounter (Signed)
 Called to [facility] Dr. Johnnie Nan updated on the visit from yesterday to be able to collaborate for the best and most effective way of treating the patient.

## 2023-10-26 ENCOUNTER — Inpatient Hospital Stay: Payer: Self-pay | Admitting: Nurse Practitioner

## 2023-10-26 ENCOUNTER — Other Ambulatory Visit: Payer: Self-pay | Admitting: Licensed Clinical Social Worker

## 2023-10-30 NOTE — Patient Instructions (Signed)
 Visit Information  Thank you for taking time to visit with me today. Please don't hesitate to contact me if I can be of assistance to you before our next scheduled appointment.  Our next appointment is by telephone on 5/22 at 2 PM Please call the care guide team at [phone] if you need to cancel or reschedule your appointment.   Following is a copy of your care plan:   Goals Addressed             This Visit's Progress    COMPLETED: Obtain Supportive Resources-Placement       Activities and task to complete in order to accomplish goals.   Keep all upcoming appointments discussed today Continue with compliance of taking medication prescribed by Doctor Implement healthy coping skills discussed to assist with management of symptoms Review resources discussed for in home aids to strengthen support in caring for pt in the home F/up with referral to [facility] ([facility])         VBCI Social Work Care Plan   On track    Problems:   Disease Management support and education needs related to Anxiety with Excessive Worry, and Depression: depressed mood  CSW Clinical Goal(s):   Over the next 60 days the Caregiver Patient will attend all scheduled medical appointments as evidenced by patient report and care team review of appointment completion in electronic MEDICAL RECORD NUMBER  demonstrate a reduction in symptoms related to Anxiety with Excessive Worry, Depression: depressed mood .  Interventions:  Mental Health:  Evaluation of current treatment plan related to Anxiety with Excessive Worry, and Depression: depressed mood Active listening / Reflection utilized Caregiver stress acknowledged :  Emotional Support Provided Mindfulness or Relaxation training provided  Patient Goals/Self-Care Activities:  Increase coping skills and healthy habits  Plan:   Telephone follow up appointment with care management team member scheduled for:  4 weeks         Please call the Suicide and Crisis Lifeline: 988 go to [facility] [address], [place] [phone]) call 911 if you are experiencing a Mental Health or Behavioral Health Crisis or need someone to talk to.  Patient verbalizes understanding of instructions and care plan provided today and agrees to view in MyChart. Active MyChart status and patient understanding of how to access instructions and care plan via MyChart confirmed with patient.     Arlis Bent [facility]  [facility], [facility] Clinical Social Worker Direct Dial: [phone]  Fax: [phone] Website: [email or web address] 5:32 PM

## 2023-10-30 NOTE — Patient Outreach (Signed)
 Complex Care Management   Visit Note  10/26/2023  Name:  Denise [name] MRN: [id number] DOB: [birth year]  Situation: Referral received for Complex Care Management related to [facility]/Behavioral Health diagnosis Anxiety and Depression  I obtained verbal consent from Patient.  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    Assessment: Patient Reported Symptoms:  Cognitive Cognitive Status: Alert and oriented to person, place, and time, Normal speech and language skills (Per Caregiver)      Neurological Neurological Review of Symptoms: No symptoms reported Neurological Conditions: Parkinson's disease  HEENT HEENT Symptoms Reported: No symptoms reported      Cardiovascular Cardiovascular Symptoms Reported: No symptoms reported    Respiratory Respiratory Symptoms Reported: No symptoms reported    Endocrine Patient reports the following symptoms related to hypoglycemia or hyperglycemia : No symptoms reported    Gastrointestinal Gastrointestinal Symptoms Reported: No symptoms reported      Genitourinary Genitourinary Symptoms Reported: No symptoms reported    Integumentary Integumentary Symptoms Reported: No symptoms reported    Musculoskeletal Musculoskelatal Symptoms Reviewed: No symptoms reported        Psychosocial   Behavioral Health Conditions: Anxiety, Depression Behavioral Management Strategies: Medication therapy Major Change/Loss/Stressor/Fears (CP): Medical condition, self Quality of Family Relationships: helpful, involved, supportive Do you feel physically threatened by others?: No      10/24/2023    4:12 PM  Depression screen PHQ 2/9  Decreased Interest 0  Down, Depressed, Hopeless 2  PHQ - 2 Score 2  Altered sleeping 1  Tired, decreased  energy 1  Change in appetite 0  Feeling bad or failure about yourself  1  Trouble concentrating 0  Moving slowly or fidgety/restless 0  Suicidal thoughts 0  PHQ-9 Score 5    There were no vitals filed for this visit.  Medications Reviewed Today     Reviewed by Jun Rightmyer D, LCSW (Social Worker) on 10/26/23 at 1417  Med List Status: <None>   Medication Order Taking? Sig Documenting Provider Last Dose Status Informant  acetaminophen  (TYLENOL  8 HOUR) 650 MG CR tablet [id number] No Take 1 tablet (650 mg total) by mouth every 8 (eight) hours as needed for pain.  Patient not taking: Reported on 10/26/2023   [name], FNP Not Taking Active   alendronate  (FOSAMAX ) 70 MG tablet [id number] Yes TAKE 1 TABLET (70 MG TOTAL) BY MOUTH EVERY 7 DAYS WITH FULL GLASS WATER ON EMPTY STOMACH [name], FNP Taking Active   atorvastatin  (LIPITOR) 10 MG tablet [id number] Yes TAKE 1 TABLET BY MOUTH EVERY DAY AT BEDTIME MONDAY THROUGH FRIDAY Moore, Janece, FNP Taking Active   Calcium  Carb-Cholecalciferol (OYSTER SHELL CALCIUM /VITAMIN D ) 500-200 MG-UNIT PACK [id number] Yes Take by mouth. [provider] Taking Active   carbidopa -levodopa  (SINEMET  IR) 25-100 MG tablet [id number] Yes Take 1 tablet by mouth at 8am,12pm, 4pm and 8pm. Try to avoid protein. May take an extra during the day if needed. [name], FNP Taking Active   dorzolamide-timolol (COSOPT) 2-0.5 % ophthalmic solution [id number] Yes Place 1 drop into the left eye 2 (two) times daily. [provider] Taking Active   hydrochlorothiazide  (HYDRODIURIL ) 12.5 MG tablet [id number] Yes TAKE 1 TABLET BY MOUTH EVERY DAY  AS NEEDED FOR LEG SWELLING AS DIRECTED [name], FNP Taking Active   QUEtiapine  (SEROQUEL ) 25 MG tablet [id number] Yes Take 1 tab in am and 2 tabs pm - Dr. Marina Shy, Abelino Able, FNP Taking Active   Travoprost, BAK Free, (TRAVATAN) 0.004 % SOLN ophthalmic solution [id number] Yes 1 drop at bedtime. [provider] Taking Active   Vitamin D , Ergocalciferol , (DRISDOL ) 1.25 MG (50000 UNIT) CAPS capsule [id number] Yes TAKE 1 CAPSULE (50,000 UNITS TOTAL) BY MOUTH EVERY 7 (SEVEN) DAYS [name], FNP Taking Active             Recommendation:   Continue utilizing strategies discussed to assist with symptom management  Follow Up Plan:   Telephone follow-up in 1 month  Alease Hunter, LCSW [facility]  [facility], [facility] Clinical Social Worker Direct Dial: [phone]  Fax: [phone] Website: [email or web address] 5:30 PM

## 2023-11-02 ENCOUNTER — Inpatient Hospital Stay: Payer: Self-pay | Admitting: Nurse Practitioner

## 2023-11-02 DIAGNOSIS — H04123 Dry eye syndrome of bilateral lacrimal glands: Secondary | ICD-10-CM | POA: Diagnosis not present

## 2023-11-02 DIAGNOSIS — H2513 Age-related nuclear cataract, bilateral: Secondary | ICD-10-CM | POA: Diagnosis not present

## 2023-11-02 DIAGNOSIS — H401123 Primary open-angle glaucoma, left eye, severe stage: Secondary | ICD-10-CM | POA: Diagnosis not present

## 2023-11-02 DIAGNOSIS — H401112 Primary open-angle glaucoma, right eye, moderate stage: Secondary | ICD-10-CM | POA: Diagnosis not present

## 2023-11-12 ENCOUNTER — Encounter: Payer: Self-pay | Admitting: Nurse Practitioner

## 2023-11-12 DIAGNOSIS — M79604 Pain in right leg: Secondary | ICD-10-CM

## 2023-11-12 HISTORY — DX: Pain in right leg: M79.604

## 2023-11-12 NOTE — Assessment & Plan Note (Addendum)
 She is having more episodes of depression and periods of anxiety and may need changes with her medications. Will refer to neuropsych to also help with therapy. Anxiety score 11. Depression score 5

## 2023-11-12 NOTE — Assessment & Plan Note (Addendum)
 Seen at ER. Ultrasound done normal. Not having pain at this time.

## 2023-11-12 NOTE — Assessment & Plan Note (Signed)
 She is having behavioral changes, she does have an appt with Neurology in the next upcoming days. However will refer to neuropsych to evaluate further.

## 2023-11-13 ENCOUNTER — Ambulatory Visit: Payer: Self-pay

## 2023-11-13 NOTE — Telephone Encounter (Addendum)
 Chief Complaint: fall Symptoms: unwitnessed fall, left forearm skin tear (from prior fall) Frequency: once this morning and twice on 11/11/23 Pertinent Negatives: Staff denies unilateral numbness, weakness, facial droop, changes in speech or vision, pain, dizziness, fever, urinary symptoms, head injury, bruising or swelling. Disposition: [] ED /[] Urgent Care (no appt availability in office) / [x] Appointment(In office/virtual)/ []  [facility]/ [] Home Care/ [] Refused Recommended Disposition /[] [facility]/ []  Follow-up with PCP Additional Notes: Kelli with [facility] calling in to report a fall this morning. Unwitnessed fall and patient was found by staff on her left side. She states they think the patient had tried to sit down without the use of her walker. She reports patient BP was 162/90 after the fall. Denies any stroke like symptoms and reports patient is sitting up at this time using her cell phone and denies any reports of pain. She states there is a left forearm skin tear from a fall on 11/11/23. Advised patient should make an appt and be seen by PCP, she states the patient's daughter is on her way up to the Senior [facility] now and they will have her call back to schedule.  Copied from CRM [id number]. Topic: Clinical - Red Word Triage >> Nov 13, 2023 10:09 AM Everette C wrote: [facility] that prompted transfer to Nurse Triage: Loetta Ringer, with [facility] has called to report a fall for the patient  The patient was found on their bottom this morning today around 9:45 Reason for Disposition  [1] Falling (two or more falls) AND [2] in past year  Answer Assessment - Initial Assessment Questions 1. MECHANISM: "How did the fall happen?"     Unwitnessed. Patient did not have her walker locked behind her. The staff said it looked like she tried to sit down without her walker and had scooted herself down to the floor.  2. DOMESTIC VIOLENCE AND ELDER ABUSE SCREENING:  "Did you fall because someone pushed you or tried to hurt you?" If Yes, ask: "Are you safe now?"     Staff on phone unable to ask patient.  3. ONSET: "When did the fall happen?" (e.g., minutes, hours, or days ago)     This morning around 0945.  4. LOCATION: "What part of the body hit the ground?" (e.g., back, buttocks, head, hips, knees, hands, head, stomach)     Left side and was perched on her elbow.  5. INJURY: "Did you hurt (injure) yourself when you fell?" If Yes, ask: "What did you injure? Tell me more about this?" (e.g., body area; type of injury; pain severity)"     Denies.  6. PAIN: "Is there any pain?" If Yes, ask: "How bad is the pain?" (e.g., Scale 1-10; or mild,  moderate, severe)   - NONE (0): No pain   - MILD (1-3): Doesn't interfere with normal activities    - MODERATE (4-7): Interferes with normal activities or awakens from sleep    - SEVERE (8-10): Excruciating pain, unable to do any normal activities      Denies.  7. SIZE: For cuts, bruises, or swelling, ask: "How large is it?" (e.g., inches or centimeters)      No new cuts, bruises or swelling. Left forearm skin tear from fall on 11/11/23.  8. PREGNANCY: "Is there any chance you are pregnant?" "When was your last menstrual period?"     N/A  9. OTHER SYMPTOMS: "Do you have any other symptoms?" (e.g., dizziness, fever, weakness; new onset or worsening).  Denies.  10. CAUSE: "What do you think caused the fall (or falling)?" (e.g., tripped, dizzy spell)       Frequents falls, two falls on 11/11/23.  Protocols used: Falls and [facility]

## 2023-11-14 ENCOUNTER — Other Ambulatory Visit: Payer: Self-pay | Admitting: Nurse Practitioner

## 2023-11-14 DIAGNOSIS — M810 Age-related osteoporosis without current pathological fracture: Secondary | ICD-10-CM

## 2023-11-15 ENCOUNTER — Ambulatory Visit

## 2023-11-15 ENCOUNTER — Ambulatory Visit (INDEPENDENT_AMBULATORY_CARE_PROVIDER_SITE_OTHER): Payer: Self-pay | Admitting: Family Medicine

## 2023-11-15 ENCOUNTER — Ambulatory Visit: Payer: Self-pay | Admitting: Nurse Practitioner

## 2023-11-15 ENCOUNTER — Encounter: Payer: Self-pay | Admitting: Family Medicine

## 2023-11-15 VITALS — BP 170/60 | HR 56 | Temp 97.8°F | Ht 66.0 in | Wt 123.0 lb

## 2023-11-15 DIAGNOSIS — R296 Repeated falls: Secondary | ICD-10-CM | POA: Diagnosis not present

## 2023-11-15 DIAGNOSIS — G20A2 Parkinson's disease without dyskinesia, with fluctuations: Secondary | ICD-10-CM

## 2023-11-15 DIAGNOSIS — R82998 Other abnormal findings in urine: Secondary | ICD-10-CM

## 2023-11-15 DIAGNOSIS — Z0000 Encounter for general adult medical examination without abnormal findings: Secondary | ICD-10-CM | POA: Diagnosis not present

## 2023-11-15 LAB — POCT URINALYSIS DIPSTICK
Bilirubin, UA: NEGATIVE
Blood, UA: NEGATIVE
Glucose, UA: NEGATIVE
Ketones, UA: NEGATIVE
Nitrite, UA: NEGATIVE
Protein, UA: NEGATIVE
Spec Grav, UA: 1.015 (ref 1.010–1.025)
Urobilinogen, UA: 2 U/dL — AB
pH, UA: 7 (ref 5.0–8.0)

## 2023-11-15 NOTE — Progress Notes (Signed)
 I,Denise [name], CMA,acting as a Neurosurgeon for [employer], NP.,have documented all relevant documentation on the behalf of Denise Spry, NP,as directed by  Denise Spry, NP while in the presence of Denise Spry, NP.  Subjective:  Patient ID: Denise [name] , female    DOB: January 09, 1951 , 73 y.o.   MRN: [id number]  Chief Complaint  Patient presents with   Fall    HPI  Patient is a 73 year old female with diagnosis of Parkinson's disease who presents today for evaluation of frequent falls. She lives in a memory care facility and in the office today with the daughter. Daughter reports that patient fell on Saturday 11/11/2023 and 11/13/2023. According to patient she tripped  but did not hit her head and she has no visible injuries anywhere else.  Parkinson's is managed by neurology and in her  last visit, seroquel  was increased due to hallucinations/paranoia, patient and daughter states that this has helped a lot. Also states that it has helped her to sleep better too. BP is unusually elevated today, asked daughter to tell the staff at care home to keep record of BP for a few days and send PCP.  Fall The accident occurred 3 to 5 days ago. The fall occurred while walking. Distance fallen: N/A. There was no blood loss. The pain is at a severity of 0/10. The patient is experiencing no pain.     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     Family History  Problem Relation Age of Onset   Hypertension Mother    Heart disease Mother    Diabetes Father    Hypertension Father    Other Father        had a condition that was "a cousin" to ALS    Diabetes Sister    Tuberculosis Maternal Grandmother    Heart disease Sister    Colitis Daughter    Colon cancer Neg Hx    Colon polyps Neg Hx    Esophageal cancer Neg Hx    Rectal cancer  Neg Hx    Stomach cancer Neg Hx      Current Outpatient Medications:    alendronate  (FOSAMAX ) 70 MG tablet, TAKE 1 TABLET (70 MG TOTAL) BY MOUTH EVERY 7 DAYS WITH FULL GLASS WATER ON EMPTY STOMACH, Disp: 12 tablet, Rfl: 1   atorvastatin  (LIPITOR) 10 MG tablet, TAKE 1 TABLET BY MOUTH EVERY DAY AT BEDTIME MONDAY THROUGH FRIDAY, Disp: 65 tablet, Rfl: 4   Calcium  Carb-Cholecalciferol (OYSTER SHELL CALCIUM /VITAMIN D ) 500-200 MG-UNIT PACK, Take by mouth., Disp: , Rfl:    carbidopa -levodopa  (SINEMET  IR) 25-100 MG tablet, Take 1 tablet by mouth at 8am,12pm, 4pm and 8pm. Try to avoid protein. May take an extra during the day if needed., Disp: , Rfl:    dorzolamide-timolol (COSOPT) 2-0.5 % ophthalmic solution, Place 1 drop into the left eye 2 (two) times daily., Disp: , Rfl:    hydrochlorothiazide  (HYDRODIURIL ) 12.5 MG tablet, TAKE 1 TABLET BY MOUTH EVERY DAY AS NEEDED FOR LEG SWELLING AS DIRECTED, Disp: 90 tablet, Rfl: 1   QUEtiapine  (SEROQUEL ) 25 MG tablet, Take 1 tab in am and 2 tabs pm - Dr. Jeneane Miracle (Patient taking differently: Take 1 tab in am and 23tabs pm -  Dr. Jeneane Miracle), Disp: , Rfl:    Travoprost, BAK Free, (TRAVATAN) 0.004 % SOLN ophthalmic solution, 1 drop at bedtime., Disp: , Rfl:    Vitamin D , Ergocalciferol , (DRISDOL ) 1.25 MG (50000 UNIT) CAPS capsule, TAKE 1 CAPSULE (50,000 UNITS TOTAL) BY MOUTH EVERY 7 (SEVEN) DAYS, Disp: 12 capsule, Rfl: 0   Allergies  Allergen Reactions   Nystatin -Triamcinolone  Swelling    BLISTERS   Triamcinolone  Acetonide     Blisters    Cephalexin  Itching and Rash     Review of Systems  Constitutional: Negative.   HENT: Negative.    Respiratory: Negative.    Cardiovascular: Negative.   Musculoskeletal:  Positive for gait problem.  Psychiatric/Behavioral:  Negative for agitation and behavioral problems.      Today's Vitals   11/15/23 1159 11/15/23 1245  BP: (!) 180/70 (!) 170/60  Pulse: (!) 56   Temp: 97.8 F (36.6 C)   TempSrc: Oral   Weight:  123 lb (55.8 kg)   Height: 5\' 6"  (1.676 m)   PainSc: 0-No pain    Body mass index is 19.85 kg/m.  Wt Readings from Last 3 Encounters:  11/15/23 123 lb (55.8 kg)  10/24/23 127 lb 3.2 oz (57.7 kg)  08/14/23 124 lb 3.2 oz (56.3 kg)    The 10-year ASCVD risk score (Arnett DK, et al., 2019) is: 9.3%   Values used to calculate the score:     Age: 66 years     Sex: Female     Is Non-Hispanic African American: Yes     Diabetic: No     Tobacco smoker: No     Systolic Blood Pressure: 120 mmHg     Is BP treated: Yes     HDL Cholesterol: 62 mg/dL     Total Cholesterol: 158 mg/dL  Objective:  Physical Exam Cardiovascular:     Rate and Rhythm: Bradycardia present.  Pulmonary:     Effort: Pulmonary effort is normal.     Breath sounds: Normal breath sounds.  Neurological:     Mental Status: She is alert.     Motor: Weakness present.     Gait: Gait abnormal.         Assessment And Plan:  Frequent falls -     POCT urinalysis dipstick  Leukocytes in urine -     Urine Culture  Parkinson's disease without dyskinesia, with fluctuating manifestations (HCC) Assessment & Plan: Chronic. Managed by Neurology.     Return if symptoms worsen or fail to improve, for keep next appt.  Patient was given opportunity to ask questions. Patient verbalized understanding of the plan and was able to repeat key elements of the plan. All questions were answered to their satisfaction.   I, Denise Spry, NP, have reviewed all documentation for this visit. The documentation on 11/25/2023 for the exam, diagnosis, procedures, and orders are all accurate and complete.    IF YOU HAVE BEEN REFERRED TO A SPECIALIST, IT MAY TAKE 1-2 WEEKS TO SCHEDULE/PROCESS THE REFERRAL. IF YOU HAVE NOT HEARD FROM US /SPECIALIST IN TWO WEEKS, PLEASE GIVE US  A CALL AT [phone] X [phone].

## 2023-11-15 NOTE — Patient Instructions (Signed)
 Denise [name] , Thank you for taking time out of your busy schedule to complete your Annual Wellness Visit with me. I enjoyed our conversation and look forward to speaking with you again next year. I, as well as your care team,  appreciate your ongoing commitment to your health goals. Please review the following plan we discussed and let me know if I can assist you in the future. Your Game plan/ To Do List    Referrals: If you haven't heard from the office you've been referred to, please reach out to them at the phone provided.  N/a Follow up Visits: Next Medicare AWV with our clinical staff: office will schedule   Have you seen your provider in the last 6 months (3 months if uncontrolled diabetes)? Yes Next Office Visit with your provider: 02/27/2024 3:40  Clinician Recommendations:  Aim for 30 minutes of exercise or brisk walking, 6-8 glasses of water, and 5 servings of fruits and vegetables each day.       This is a list of the screening recommended for you and due dates:  Health Maintenance  Topic Date Due   Flu Shot  02/02/2024   COVID-19 Vaccine (9 - Mixed Product risk 2024-25 season) 02/11/2024   Mammogram  09/11/2024   Medicare Annual Wellness Visit  11/14/2024   Colon Cancer Screening  07/09/2025   DTaP/Tdap/Td vaccine (3 - Td or Tdap) 05/02/2027   Pneumonia Vaccine  Completed   DEXA scan (bone density measurement)  Completed   Hepatitis C Screening  Completed   Zoster (Shingles) Vaccine  Completed   HPV Vaccine  Aged Out   Meningitis B Vaccine  Aged Out    Advanced directives: (Copy Requested) Please bring a copy of your health care power of attorney and living will to the office to be added to your chart at your convenience. You can mail to [facility] [address] [place], [place] [address] or email to [email or web address] Advance Care Planning is important because it:  [x]  Makes sure you receive the medical care that is consistent with your values,  goals, and preferences  [x]  It provides guidance to your family and loved ones and reduces their decisional burden about whether or not they are making the right decisions based on your wishes.  Follow the link provided in your after visit summary or read over the paperwork we have mailed to you to help you started getting your Advance Directives in place. If you need assistance in completing these, please reach out to us  so that we can help you!  See attachments for Preventive Care and Fall Prevention Tips.

## 2023-11-15 NOTE — Progress Notes (Signed)
 Subjective:   Denise [name] is a 73 y.o. who presents for a Medicare Wellness preventive visit.  As a reminder, Annual Wellness Visits don't include a physical exam, and some assessments may be limited, especially if this visit is performed virtually. We may recommend an in-person visit if needed.  Visit Complete: Virtual I connected with  Denise [name] on 11/15/23 by a audio enabled telemedicine application and verified that I am speaking with the correct person using two identifiers.  Patient Location: Home  Provider Location: Home Office  I discussed the limitations of evaluation and management by telemedicine. The patient expressed understanding and agreed to proceed.  Vital Signs: Because this visit was a virtual/telehealth visit, some criteria may be missing or patient reported. Any vitals not documented were not able to be obtained and vitals that have been documented are patient reported.  VideoError- Librarian, academic were attempted between this provider and patient, however failed, due to patient having technical difficulties OR patient did not have access to video capability.  We continued and completed visit with audio only.   Persons Participating in Visit: Patient assisted by sister.  AWV Questionnaire: No: Patient Medicare AWV questionnaire was not completed prior to this visit.  Cardiac Risk Factors include: advanced age (>81men, >36 women)     Objective:     Today's Vitals   There is no height or weight on file to calculate BMI.     11/15/2023    4:05 PM 06/07/2023    7:53 AM 05/22/2023   12:37 PM 11/09/2022    2:41 PM 11/02/2021    4:38 PM 10/12/2021    1:45 PM 02/08/2021   10:22 AM  Advanced Directives  Does Patient Have a Medical Advance Directive? Yes Yes Yes Yes Yes Yes Yes  Type of Estate agent of [employer] Power of [place];Living will Healthcare Power of [place];Living will Healthcare Power  of [place];Living will Healthcare Power of [place];Living will Healthcare Power of [place];Living will Healthcare Power of [place];Living will  Does patient want to make changes to medical advance directive?   Yes (MAU/Ambulatory/Procedural Areas - Information given)      Copy of Healthcare Power of Attorney in Chart? No - copy requested   No - copy requested No - copy requested      Current Medications (verified) Outpatient Encounter Medications as of 11/15/2023  Medication Sig   alendronate  (FOSAMAX ) 70 MG tablet TAKE 1 TABLET (70 MG TOTAL) BY MOUTH EVERY 7 DAYS WITH FULL GLASS WATER ON EMPTY STOMACH   atorvastatin  (LIPITOR) 10 MG tablet TAKE 1 TABLET BY MOUTH EVERY DAY AT BEDTIME MONDAY THROUGH FRIDAY   Calcium  Carb-Cholecalciferol (OYSTER SHELL CALCIUM /VITAMIN D ) 500-200 MG-UNIT PACK Take by mouth.   carbidopa -levodopa  (SINEMET  IR) 25-100 MG tablet Take 1 tablet by mouth at 8am,12pm, 4pm and 8pm. Try to avoid protein. May take an extra during the day if needed.   dorzolamide-timolol (COSOPT) 2-0.5 % ophthalmic solution Place 1 drop into the left eye 2 (two) times daily.   hydrochlorothiazide  (HYDRODIURIL ) 12.5 MG tablet TAKE 1 TABLET BY MOUTH EVERY DAY AS NEEDED FOR LEG SWELLING AS DIRECTED   QUEtiapine  (SEROQUEL ) 25 MG tablet Take 1 tab in am and 2 tabs pm - Dr. Jeneane Miracle (Patient taking differently: Take 1 tab in am and 23tabs pm - Dr. Jeneane Miracle)   Travoprost, BAK Free, (TRAVATAN) 0.004 % SOLN ophthalmic solution 1 drop at bedtime.   Vitamin D , Ergocalciferol , (DRISDOL ) 1.25 MG (50000 UNIT) CAPS capsule  TAKE 1 CAPSULE (50,000 UNITS TOTAL) BY MOUTH EVERY 7 (SEVEN) DAYS   No facility-administered encounter medications on file as of 11/15/2023.    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   Past Surgical History:  Procedure Laterality Date   TUBAL LIGATION     Family History  Problem Relation Age of Onset   Hypertension Mother    Heart disease Mother    Diabetes Father    Hypertension Father    Other Father        had a condition that was "a cousin" to ALS    Diabetes Sister    Tuberculosis Maternal Grandmother    Heart disease Sister    Colitis Daughter    Colon cancer Neg Hx    Colon polyps Neg Hx    Esophageal cancer Neg Hx    Rectal cancer Neg Hx    Stomach cancer Neg Hx    Social History   Socioeconomic History   Marital status: Single    Spouse name: Not on file   Number of children: 3   Years of education: Not on file   Highest education level: Some college, no degree  Occupational History   Not on file  Tobacco Use   Smoking status: Never   Smokeless tobacco: Never  Vaping Use   Vaping status: Never Used  Substance and Sexual Activity   Alcohol use: Never   Drug use: No   Sexual activity: Not Currently    Birth control/protection: Post-menopausal  Other Topics Concern   Not on file  Social History Narrative   Lives with daughter and grandaughter    Right handed   Caffeine: none   Social Drivers of Health   Financial Resource Strain: Low Risk  (11/15/2023)   Overall Financial Resource Strain (CARDIA)    Difficulty of Paying Living Expenses: Not hard at all  Food Insecurity: No Food Insecurity (11/15/2023)   Hunger Vital Sign    Worried About Running Out of Food in the Last Year: Never true    Ran Out of Food in the Last Year: Never true  Transportation Needs: No Transportation Needs (11/15/2023)   PRAPARE - Administrator, Civil Service (Medical): No    Lack of Transportation (Non-Medical): No  Physical Activity: Inactive (11/15/2023)   Exercise Vital Sign    Days of Exercise per Week: 0 days    Minutes of Exercise per Session: 0 min  Stress: No Stress  Concern Present (11/15/2023)   [employer] of Occupational Health - Occupational Stress Questionnaire    Feeling of Stress : Only a little  Social Connections: Moderately Isolated (11/15/2023)   Social Connection and Isolation Panel [NHANES]    Frequency of Communication with Friends and Family: Twice a week    Frequency of Social Gatherings with Friends and Family: More than three times a week    Attends Religious Services: 1 to 4 times per year    Active Member of [employer] or Organizations: No    Attends Banker Meetings: Never    Marital Status: Widowed    Tobacco Counseling Counseling given: Not Answered    Clinical Intake:  Pre-visit preparation  completed: Yes  Pain : No/denies pain     Nutritional Risks: None Diabetes: No  Lab Results  Component Value Date   HGBA1C 5.8 (H) 08/14/2023   HGBA1C 6.0 (H) 04/05/2023   HGBA1C 5.9 (H) 11/09/2022     How often do you need to have someone help you when you read instructions, pamphlets, or other written materials from your doctor or pharmacy?: 4 - Often  Interpreter Needed?: No  Information entered by :: NAllen LPN   Activities of Daily Living     11/15/2023    3:58 PM  In your present state of health, do you have any difficulty performing the following activities:  Hearing? 0  Vision? 1  Comment cataracts and glaucoma  Difficulty concentrating or making decisions? 1  Walking or climbing stairs? 1  Dressing or bathing? 1  Doing errands, shopping? 1  Preparing Food and eating ? Y  Using the Toilet? Y  In the past six months, have you accidently leaked urine? Y  Comment incontinence  Do you have problems with loss of bowel control? Y  Comment incontinence  Managing your Medications? N  Managing your Finances? Y  Housekeeping or managing your Housekeeping? Y    Patient Care Team: [name], FNP as PCP - General (General Practice) Lewis, Jasmine D, LCSW as VBCI Care Management (Licensed  Clinical Social Worker) Adriana Albany, LCSW (Licensed Clinical Social Worker)  Indicate any recent Medical Services you may have received from other than [facility] providers in the past year (date may be approximate).     Assessment:    This is a routine wellness examination for [place].  Hearing/Vision screen Hearing Screening - Comments:: Denies hearing issues Vision Screening - Comments:: Regular eye exams, [facility]   Goals Addressed             This Visit's Progress    Patient Stated       11/15/2023, drink more water       Depression Screen     11/15/2023    4:07 PM 10/24/2023    4:12 PM 08/14/2023    3:13 PM 11/09/2022    2:43 PM 08/10/2022   11:21 AM 04/05/2022    4:07 PM 03/15/2022   12:12 PM  PHQ 2/9 Scores  PHQ - 2 Score 1 2 0 0 0 0 0  PHQ- 9 Score 3 5 0        Fall Risk     11/15/2023    4:05 PM 08/14/2023    3:13 PM 03/13/2023    3:27 PM 11/09/2022    2:42 PM 08/10/2022   11:21 AM  Fall Risk   Falls in the past year? 1 0 0 1 1  Comment parkinsons   not sure exactly thinks the Parkinsons   Number falls in past yr: 1 0 0 1 1  Injury with Fall? 0 0 0 0   Comment    bruises   Risk for fall due to : History of fall(s);Impaired balance/gait;Impaired mobility;Medication side effect History of fall(s);Impaired balance/gait;Impaired mobility No Fall Risks History of fall(s);Impaired balance/gait;Impaired mobility;Medication side effect   Follow up Falls prevention discussed;Falls evaluation completed Falls evaluation completed Falls evaluation completed Falls prevention discussed;Education provided;Falls evaluation completed     MEDICARE RISK AT HOME:  Medicare Risk at Home Any stairs in or around the home?: No If so, are there any without handrails?: No Home free of loose throw rugs in walkways, pet beds, electrical cords, etc?: Yes  Adequate lighting in your home to reduce risk of falls?: Yes Life alert?: No Use of a cane, walker or w/c?: Yes Grab bars in the  bathroom?: Yes Shower chair or bench in shower?: Yes Elevated toilet seat or a handicapped toilet?: Yes  TIMED UP AND GO:  Was the test performed?  No  Cognitive Function: Impaired: Patient has current diagnosis of cognitive impairment.        11/09/2022    2:45 PM 11/02/2021    4:01 PM 11/02/2021    4:00 PM 03/19/2021    1:40 PM 03/19/2021    1:24 PM  6CIT Screen  What Year? 0 points 0 points 0 points 0 points 0 points  What month? 0 points 0 points 0 points 0 points 0 points  What time? 0 points 3 points 0 points 0 points 0 points  Count back from 20 0 points 0 points 2 points 0 points 0 points  Months in reverse 0 points 0 points 0 points 0 points 0 points  Repeat phrase 0 points 0 points 0 points 0 points 0 points  Total Score 0 points 3 points 2 points 0 points 0 points    Immunizations Immunization History  Administered Date(s) Administered   Fluad Quad(high Dose 65+) 05/13/2019, 04/07/2020, 03/15/2022   Influenza, High Dose Seasonal PF 05/21/2021   Influenza, Seasonal, Injecte, Preservative Fre 03/17/2015   Influenza,inj,Quad PF,6+ Mos 03/17/2015, 05/07/2018   Influenza-Unspecified 07/18/2003, 03/17/2015, 05/21/2021   Moderna Covid-19 Vaccine Bivalent Booster 73yrs & up 09/01/2021   Moderna Sars-Covid-2 Vaccination 08/31/2019, 09/28/2019   PFIZER Comirnaty(Gray Top)Covid-19 Tri-Sucrose Vaccine 02/15/2021   PFIZER(Purple Top)SARS-COV-2 Vaccination 05/23/2020, 02/15/2021, 04/08/2021   PNEUMOCOCCAL CONJUGATE-20 04/08/2021   Pfizer(Comirnaty)Fall Seasonal Vaccine 12 years and older 12/21/2022, 08/14/2023   Td 07/18/2003   Tdap 05/01/2017   Zoster Recombinant(Shingrix ) 05/08/2021, 08/09/2021    Screening Tests Health Maintenance  Topic Date Due   INFLUENZA VACCINE  02/02/2024   COVID-19 Vaccine (9 - Mixed Product risk 2024-25 season) 02/11/2024   MAMMOGRAM  09/11/2024   Medicare Annual Wellness (AWV)  11/14/2024   Colonoscopy  07/09/2025   DTaP/Tdap/Td (3 - Td or  Tdap) 05/02/2027   Pneumonia Vaccine 79+ Years old  Completed   DEXA SCAN  Completed   Hepatitis C Screening  Completed   Zoster Vaccines- Shingrix   Completed   HPV VACCINES  Aged Out   Meningococcal B Vaccine  Aged Out    Health Maintenance  There are no preventive care reminders to display for this patient.  Health Maintenance Items Addressed: Up to date  Additional Screening:  Vision Screening: Recommended annual ophthalmology exams for early detection of glaucoma and other disorders of the eye.  Dental Screening: Recommended annual dental exams for proper oral hygiene  Community Resource Referral / Chronic Care Management: CRR required this visit?  No   CCM required this visit?  No   Plan:    I have personally reviewed and noted the following in the patient's chart:   Medical and social history Use of alcohol, tobacco or illicit drugs  Current medications and supplements including opioid prescriptions. Patient is not currently taking opioid prescriptions. Functional ability and status Nutritional status Physical activity Advanced directives List of other physicians Hospitalizations, surgeries, and ER visits in previous 12 months Vitals Screenings to include cognitive, depression, and falls Referrals and appointments  In addition, I have reviewed and discussed with patient certain preventive protocols, quality metrics, and best practice recommendations. A written personalized care plan for preventive services  as well as general preventive health recommendations were provided to patient.   Areatha Beecham, LPN   1/61/0960   After Visit Summary: (MyChart) Due to this being a telephonic visit, the after visit summary with patients personalized plan was offered to patient via MyChart   Notes: Nothing significant to report at this time.

## 2023-11-17 LAB — URINE CULTURE

## 2023-11-21 ENCOUNTER — Telehealth: Payer: Self-pay | Admitting: Nurse Practitioner

## 2023-11-21 ENCOUNTER — Telehealth: Payer: Self-pay

## 2023-11-21 ENCOUNTER — Ambulatory Visit: Payer: Self-pay | Admitting: Nurse Practitioner

## 2023-11-21 NOTE — Telephone Encounter (Signed)
 Returned call to daughter to discuss her recent falls and refusal of some of her care. Her daughter reports she will have episodes she will refuse care but they will then call the daughter to come up to help. She is in a memory care. Her daughter feels like she is in the best place. Her room is across the hall from the directors office and Chiropodist. They are trying to make sure she is sitting all the way on the bed so she does not slide. She is reminded to use her cane or walker. She is eating well when her memory is doing okay. Her daughter visits daily and her aunt. She is active with the activities. She will find out how to order the physical therapy in the building.

## 2023-11-21 NOTE — Telephone Encounter (Signed)
 Copied from CRM [id number]. Topic: Clinical - Medical Advice >> Nov 21, 2023  8:59 AM Rosamond Comes wrote: Reason for CRM: [place] Special care coordinator, with [facility]  [phone] reporting, patient fell in bedroom and did not complain of any pain, last night 11/20/23 around 9pm patient has fallen 4 times this month. [place] has refused care. Patient was admitted on 10/18/23   11/14/23 Patient refused care was visibly wet,refused to eat on  11/16/23 refused care, she didn't want help 11/17/23 refused care,morning medication, breakfast, lunch and snacks  Patient refuses to go to dinning room to eat. Patient has food in room, sometimes she will eat it.   Facility calls the daughter about refusal of care and provider for falls only    Provider is going to speak with patients daughter.

## 2023-11-21 NOTE — Telephone Encounter (Signed)
 [place] Special care coordinator, with [facility]  [phone] - facility she currently at.

## 2023-11-23 ENCOUNTER — Other Ambulatory Visit: Payer: Self-pay | Admitting: Licensed Clinical Social Worker

## 2023-11-24 NOTE — Patient Instructions (Signed)
 Visit Information  Thank you for taking time to visit with me today. Please don't hesitate to contact me if I can be of assistance to you before our next scheduled appointment.  Your next care management appointment is by telephone on 06/19 at 2 PM  Please call the care guide team at [phone] if you need to cancel, schedule, or reschedule an appointment.   Please call the Suicide and Crisis Lifeline: 988 go to [facility] [address] [place] [phone]) call 911 if you are experiencing a Mental Health or Behavioral Health Crisis or need someone to talk to.  Alease Hunter, LCSW [place]  [facility], [facility] Clinical Social Worker Direct Dial: [phone]  Fax: [phone] Website: [email or web address] 5:47 AM

## 2023-11-24 NOTE — Patient Outreach (Signed)
 Complex Care Management   Visit Note  11/23/2023  Name:  Denise [name] MRN: [id number] DOB: Aug 03, 1950  Situation: Referral received for Complex Care Management related to Mental/Behavioral Health diagnosis Anxiety/Depression I obtained verbal consent from Caregiver.  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    Assessment: Patient Reported Symptoms:  Cognitive Cognitive Status: Alert and oriented to person, place, and time, Normal speech and language skills      Neurological Neurological Review of Symptoms: No symptoms reported Neurological Conditions: Parkinson's disease  HEENT HEENT Symptoms Reported: No symptoms reported      Cardiovascular Cardiovascular Symptoms Reported: No symptoms reported    Respiratory Respiratory Symptoms Reported: No symptoms reported    Endocrine Patient reports the following symptoms related to hypoglycemia or hyperglycemia : No symptoms reported    Gastrointestinal Gastrointestinal Symptoms Reported: No symptoms reported      Genitourinary Genitourinary Symptoms Reported: No symptoms reported    Integumentary Integumentary Symptoms Reported: No symptoms reported    Musculoskeletal Musculoskelatal Symptoms Reviewed: No symptoms reported   Falls in the past year?: Yes Number of falls in past year: 2 or more Was there an injury with Fall?: No Fall Risk Category Calculator: 2 Patient Fall Risk Level: Moderate Fall Risk Patient at Risk for Falls Due to: History of fall(s), Impaired balance/gait Fall risk Follow up: Falls prevention discussed  Psychosocial Psychosocial Symptoms Reported: Anxiety - if selected complete GAD Additional Psychological Details: Patient is doing well. Panic attacks have decreased with the  adjustment of medications approx two weeks ago. Additional healthy coping skills discussed Behavioral Health Conditions: Anxiety, Depression Behavioral Management Strategies: Adequate rest, Support system, Coping strategies, Medication therapy Major Change/Loss/Stressor/Fears (CP): Medical condition, self Quality of Family Relationships: helpful, involved, supportive Do you feel physically threatened by others?: No      11/15/2023    4:07 PM  Depression screen PHQ 2/9  Decreased Interest 0  Down, Depressed, Hopeless 1  PHQ - 2 Score 1  Altered sleeping 0  Tired, decreased energy 2  Change in appetite 0  Feeling bad or failure about yourself  0  Trouble concentrating 0  Moving slowly or fidgety/restless 0  Suicidal thoughts 0  PHQ-9 Score 3    There were no vitals filed for this visit.  Medications Reviewed Today   Medications were not reviewed in this encounter     Recommendation:   Continue utilizing strategies discussed to assist with symptom management  Follow Up Plan:   Telephone follow-up 4-6 weeks  Alease Hunter, LCSW [place]  [facility], [facility] Clinical Social Worker Direct Dial: [phone]  Fax: [phone] Website: [email or web address] 5:46 AM

## 2023-11-25 ENCOUNTER — Encounter: Payer: Self-pay | Admitting: Nurse Practitioner

## 2023-11-25 DIAGNOSIS — R296 Repeated falls: Secondary | ICD-10-CM | POA: Insufficient documentation

## 2023-11-25 DIAGNOSIS — R82998 Other abnormal findings in urine: Secondary | ICD-10-CM | POA: Insufficient documentation

## 2023-11-25 NOTE — Assessment & Plan Note (Signed)
 Chronic. Managed by Neurology.

## 2023-11-28 ENCOUNTER — Other Ambulatory Visit: Payer: Self-pay | Admitting: Nurse Practitioner

## 2023-11-28 DIAGNOSIS — R296 Repeated falls: Secondary | ICD-10-CM

## 2023-11-28 DIAGNOSIS — G20A1 Parkinson's disease without dyskinesia, without mention of fluctuations: Secondary | ICD-10-CM

## 2023-11-30 ENCOUNTER — Telehealth: Payer: Self-pay

## 2023-11-30 DIAGNOSIS — Z556 Problems related to health literacy: Secondary | ICD-10-CM | POA: Diagnosis not present

## 2023-11-30 DIAGNOSIS — F0280 Dementia in other diseases classified elsewhere without behavioral disturbance: Secondary | ICD-10-CM | POA: Diagnosis not present

## 2023-11-30 DIAGNOSIS — M810 Age-related osteoporosis without current pathological fracture: Secondary | ICD-10-CM | POA: Diagnosis not present

## 2023-11-30 DIAGNOSIS — N189 Chronic kidney disease, unspecified: Secondary | ICD-10-CM | POA: Diagnosis not present

## 2023-11-30 DIAGNOSIS — H269 Unspecified cataract: Secondary | ICD-10-CM | POA: Diagnosis not present

## 2023-11-30 DIAGNOSIS — Z604 Social exclusion and rejection: Secondary | ICD-10-CM | POA: Diagnosis not present

## 2023-11-30 DIAGNOSIS — Z79899 Other long term (current) drug therapy: Secondary | ICD-10-CM | POA: Diagnosis not present

## 2023-11-30 DIAGNOSIS — Z7983 Long term (current) use of bisphosphonates: Secondary | ICD-10-CM | POA: Diagnosis not present

## 2023-11-30 DIAGNOSIS — E559 Vitamin D deficiency, unspecified: Secondary | ICD-10-CM | POA: Diagnosis not present

## 2023-11-30 DIAGNOSIS — G20A2 Parkinson's disease without dyskinesia, with fluctuations: Secondary | ICD-10-CM | POA: Diagnosis not present

## 2023-11-30 DIAGNOSIS — E538 Deficiency of other specified B group vitamins: Secondary | ICD-10-CM | POA: Diagnosis not present

## 2023-11-30 DIAGNOSIS — R32 Unspecified urinary incontinence: Secondary | ICD-10-CM | POA: Diagnosis not present

## 2023-11-30 DIAGNOSIS — H3410 Central retinal artery occlusion, unspecified eye: Secondary | ICD-10-CM | POA: Diagnosis not present

## 2023-11-30 DIAGNOSIS — Z9181 History of falling: Secondary | ICD-10-CM | POA: Diagnosis not present

## 2023-11-30 DIAGNOSIS — H409 Unspecified glaucoma: Secondary | ICD-10-CM | POA: Diagnosis not present

## 2023-11-30 NOTE — Telephone Encounter (Signed)
 Copied from CRM [id number]. Topic: Referral - Prior Authorization Question >> Nov 30, 2023  8:49 AM Oddis Bench wrote: Reason for CRM: [place] [facility], patient has order for PT with medicare need to have visit note from the same provider  showing [name] and a different provider. Needs to have the notes from Hospital discharge faxed over from 04/22 to number [phone]  Last note faxed.

## 2023-12-07 DIAGNOSIS — Z9181 History of falling: Secondary | ICD-10-CM | POA: Diagnosis not present

## 2023-12-07 DIAGNOSIS — H269 Unspecified cataract: Secondary | ICD-10-CM | POA: Diagnosis not present

## 2023-12-07 DIAGNOSIS — H409 Unspecified glaucoma: Secondary | ICD-10-CM | POA: Diagnosis not present

## 2023-12-07 DIAGNOSIS — E559 Vitamin D deficiency, unspecified: Secondary | ICD-10-CM | POA: Diagnosis not present

## 2023-12-07 DIAGNOSIS — G20A2 Parkinson's disease without dyskinesia, with fluctuations: Secondary | ICD-10-CM | POA: Diagnosis not present

## 2023-12-07 DIAGNOSIS — H3410 Central retinal artery occlusion, unspecified eye: Secondary | ICD-10-CM | POA: Diagnosis not present

## 2023-12-07 DIAGNOSIS — N189 Chronic kidney disease, unspecified: Secondary | ICD-10-CM | POA: Diagnosis not present

## 2023-12-07 DIAGNOSIS — F0280 Dementia in other diseases classified elsewhere without behavioral disturbance: Secondary | ICD-10-CM | POA: Diagnosis not present

## 2023-12-07 DIAGNOSIS — M810 Age-related osteoporosis without current pathological fracture: Secondary | ICD-10-CM | POA: Diagnosis not present

## 2023-12-07 DIAGNOSIS — Z604 Social exclusion and rejection: Secondary | ICD-10-CM | POA: Diagnosis not present

## 2023-12-07 DIAGNOSIS — E538 Deficiency of other specified B group vitamins: Secondary | ICD-10-CM | POA: Diagnosis not present

## 2023-12-07 DIAGNOSIS — R32 Unspecified urinary incontinence: Secondary | ICD-10-CM | POA: Diagnosis not present

## 2023-12-07 DIAGNOSIS — Z556 Problems related to health literacy: Secondary | ICD-10-CM | POA: Diagnosis not present

## 2023-12-07 DIAGNOSIS — Z7983 Long term (current) use of bisphosphonates: Secondary | ICD-10-CM | POA: Diagnosis not present

## 2023-12-07 DIAGNOSIS — Z79899 Other long term (current) drug therapy: Secondary | ICD-10-CM | POA: Diagnosis not present

## 2023-12-12 DIAGNOSIS — M810 Age-related osteoporosis without current pathological fracture: Secondary | ICD-10-CM | POA: Diagnosis not present

## 2023-12-12 DIAGNOSIS — H269 Unspecified cataract: Secondary | ICD-10-CM | POA: Diagnosis not present

## 2023-12-12 DIAGNOSIS — Z9181 History of falling: Secondary | ICD-10-CM | POA: Diagnosis not present

## 2023-12-12 DIAGNOSIS — H3410 Central retinal artery occlusion, unspecified eye: Secondary | ICD-10-CM | POA: Diagnosis not present

## 2023-12-12 DIAGNOSIS — N189 Chronic kidney disease, unspecified: Secondary | ICD-10-CM | POA: Diagnosis not present

## 2023-12-12 DIAGNOSIS — R32 Unspecified urinary incontinence: Secondary | ICD-10-CM | POA: Diagnosis not present

## 2023-12-12 DIAGNOSIS — F0280 Dementia in other diseases classified elsewhere without behavioral disturbance: Secondary | ICD-10-CM | POA: Diagnosis not present

## 2023-12-12 DIAGNOSIS — Z604 Social exclusion and rejection: Secondary | ICD-10-CM | POA: Diagnosis not present

## 2023-12-12 DIAGNOSIS — E559 Vitamin D deficiency, unspecified: Secondary | ICD-10-CM | POA: Diagnosis not present

## 2023-12-12 DIAGNOSIS — G20A2 Parkinson's disease without dyskinesia, with fluctuations: Secondary | ICD-10-CM | POA: Diagnosis not present

## 2023-12-12 DIAGNOSIS — H409 Unspecified glaucoma: Secondary | ICD-10-CM | POA: Diagnosis not present

## 2023-12-12 DIAGNOSIS — E538 Deficiency of other specified B group vitamins: Secondary | ICD-10-CM | POA: Diagnosis not present

## 2023-12-12 DIAGNOSIS — Z79899 Other long term (current) drug therapy: Secondary | ICD-10-CM | POA: Diagnosis not present

## 2023-12-12 DIAGNOSIS — Z556 Problems related to health literacy: Secondary | ICD-10-CM | POA: Diagnosis not present

## 2023-12-12 DIAGNOSIS — Z7983 Long term (current) use of bisphosphonates: Secondary | ICD-10-CM | POA: Diagnosis not present

## 2023-12-14 DIAGNOSIS — M810 Age-related osteoporosis without current pathological fracture: Secondary | ICD-10-CM | POA: Diagnosis not present

## 2023-12-14 DIAGNOSIS — Z556 Problems related to health literacy: Secondary | ICD-10-CM | POA: Diagnosis not present

## 2023-12-14 DIAGNOSIS — E538 Deficiency of other specified B group vitamins: Secondary | ICD-10-CM | POA: Diagnosis not present

## 2023-12-14 DIAGNOSIS — R32 Unspecified urinary incontinence: Secondary | ICD-10-CM | POA: Diagnosis not present

## 2023-12-14 DIAGNOSIS — Z79899 Other long term (current) drug therapy: Secondary | ICD-10-CM | POA: Diagnosis not present

## 2023-12-14 DIAGNOSIS — F0280 Dementia in other diseases classified elsewhere without behavioral disturbance: Secondary | ICD-10-CM | POA: Diagnosis not present

## 2023-12-14 DIAGNOSIS — E559 Vitamin D deficiency, unspecified: Secondary | ICD-10-CM | POA: Diagnosis not present

## 2023-12-14 DIAGNOSIS — H3410 Central retinal artery occlusion, unspecified eye: Secondary | ICD-10-CM | POA: Diagnosis not present

## 2023-12-14 DIAGNOSIS — Z7983 Long term (current) use of bisphosphonates: Secondary | ICD-10-CM | POA: Diagnosis not present

## 2023-12-14 DIAGNOSIS — N189 Chronic kidney disease, unspecified: Secondary | ICD-10-CM | POA: Diagnosis not present

## 2023-12-14 DIAGNOSIS — Z9181 History of falling: Secondary | ICD-10-CM | POA: Diagnosis not present

## 2023-12-14 DIAGNOSIS — G20A2 Parkinson's disease without dyskinesia, with fluctuations: Secondary | ICD-10-CM | POA: Diagnosis not present

## 2023-12-14 DIAGNOSIS — H409 Unspecified glaucoma: Secondary | ICD-10-CM | POA: Diagnosis not present

## 2023-12-14 DIAGNOSIS — Z604 Social exclusion and rejection: Secondary | ICD-10-CM | POA: Diagnosis not present

## 2023-12-14 DIAGNOSIS — H269 Unspecified cataract: Secondary | ICD-10-CM | POA: Diagnosis not present

## 2023-12-19 DIAGNOSIS — L03032 Cellulitis of left toe: Secondary | ICD-10-CM | POA: Diagnosis not present

## 2023-12-19 DIAGNOSIS — M79675 Pain in left toe(s): Secondary | ICD-10-CM | POA: Diagnosis not present

## 2023-12-21 ENCOUNTER — Other Ambulatory Visit: Payer: Self-pay | Admitting: Licensed Clinical Social Worker

## 2023-12-21 DIAGNOSIS — N189 Chronic kidney disease, unspecified: Secondary | ICD-10-CM | POA: Diagnosis not present

## 2023-12-21 DIAGNOSIS — Z604 Social exclusion and rejection: Secondary | ICD-10-CM | POA: Diagnosis not present

## 2023-12-21 DIAGNOSIS — E559 Vitamin D deficiency, unspecified: Secondary | ICD-10-CM | POA: Diagnosis not present

## 2023-12-21 DIAGNOSIS — H269 Unspecified cataract: Secondary | ICD-10-CM | POA: Diagnosis not present

## 2023-12-21 DIAGNOSIS — G20A2 Parkinson's disease without dyskinesia, with fluctuations: Secondary | ICD-10-CM | POA: Diagnosis not present

## 2023-12-21 DIAGNOSIS — H409 Unspecified glaucoma: Secondary | ICD-10-CM | POA: Diagnosis not present

## 2023-12-21 DIAGNOSIS — Z556 Problems related to health literacy: Secondary | ICD-10-CM | POA: Diagnosis not present

## 2023-12-21 DIAGNOSIS — Z7983 Long term (current) use of bisphosphonates: Secondary | ICD-10-CM | POA: Diagnosis not present

## 2023-12-21 DIAGNOSIS — H3410 Central retinal artery occlusion, unspecified eye: Secondary | ICD-10-CM | POA: Diagnosis not present

## 2023-12-21 DIAGNOSIS — Z79899 Other long term (current) drug therapy: Secondary | ICD-10-CM | POA: Diagnosis not present

## 2023-12-21 DIAGNOSIS — M810 Age-related osteoporosis without current pathological fracture: Secondary | ICD-10-CM | POA: Diagnosis not present

## 2023-12-21 DIAGNOSIS — E538 Deficiency of other specified B group vitamins: Secondary | ICD-10-CM | POA: Diagnosis not present

## 2023-12-21 DIAGNOSIS — R32 Unspecified urinary incontinence: Secondary | ICD-10-CM | POA: Diagnosis not present

## 2023-12-21 DIAGNOSIS — Z9181 History of falling: Secondary | ICD-10-CM | POA: Diagnosis not present

## 2023-12-21 DIAGNOSIS — F0280 Dementia in other diseases classified elsewhere without behavioral disturbance: Secondary | ICD-10-CM | POA: Diagnosis not present

## 2023-12-21 NOTE — Patient Outreach (Signed)
 Complex Care Management   Visit Note  12/21/2023  Name:  Denise [name] MRN: [id number] DOB: [birth year]  Situation: Referral received for Complex Care Management related to Mental/Behavioral Health diagnosis MDD/GAD I obtained verbal consent from Patient.  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    Assessment: Patient Reported Symptoms:  Cognitive Cognitive Status: Able to follow simple commands, Normal speech and language skills Cognitive/Intellectual Conditions Management [RPT]: None reported or documented in medical history or problem list      Neurological Neurological Review of Symptoms: No symptoms reported Neurological Conditions: Parkinson's disease Neurological Management Strategies: Routine screening, Medication therapy  HEENT HEENT Symptoms Reported: No symptoms reported      Cardiovascular Cardiovascular Symptoms Reported: No symptoms reported    Respiratory Respiratory Symptoms Reported: No symptoms reported    Endocrine Patient reports the following symptoms related to hypoglycemia or hyperglycemia : No symptoms reported Is patient diabetic?: No    Gastrointestinal Gastrointestinal Symptoms Reported: No symptoms reported      Genitourinary Genitourinary Symptoms Reported: No symptoms reported Genitourinary Conditions: Frequency  Integumentary Integumentary Symptoms Reported: No symptoms reported    Musculoskeletal Musculoskelatal Symptoms Reviewed: Unsteady gait Additional Musculoskeletal Details: Parkinson's Musculoskeletal Conditions: Unsteady gait Musculoskeletal Management Strategies: Medication therapy Falls in the past year?: Yes Number of falls in past year: 2 or more Was there an injury with Fall?: No Fall  Risk Category Calculator: 2 Patient Fall Risk Level: Moderate Fall Risk Patient at Risk for Falls Due to: History of fall(s), Impaired balance/gait  Psychosocial Additional Psychological Details: Caregiver stress discussed, in addition, to strategies to manage symptoms. Pt has hx of refusing medications/assistance with changing depends. Encouragement provided and solution focused options discussed Behavioral Health Conditions: Anxiety, Depression Behavioral Management Strategies: Adequate rest, Support system, Medication therapy Major Change/Loss/Stressor/Fears (CP): Medical condition, self Quality of Family Relationships: helpful, involved, supportive      11/15/2023    4:07 PM  Depression screen PHQ 2/9  Decreased Interest 0  Down, Depressed, Hopeless 1  PHQ - 2 Score 1  Altered sleeping 0  Tired, decreased energy 2  Change in appetite 0  Feeling bad or failure about yourself  0  Trouble concentrating 0  Moving slowly or fidgety/restless 0  Suicidal thoughts 0  PHQ-9 Score 3    There were no vitals filed for this visit.  Medications Reviewed Today     Reviewed by Adriana Albany, LCSW (Social Worker) on 12/21/23 at 1408  Med List Status: <None>   Medication Order Taking? Sig Documenting Provider Last Dose Status Informant  alendronate  (FOSAMAX ) 70 MG tablet [id number] No TAKE 1 TABLET (70 MG TOTAL) BY MOUTH EVERY 7 DAYS WITH FULL GLASS WATER ON EMPTY STOMACH [name], FNP Taking Active   atorvastatin  (LIPITOR) 10 MG tablet [id number] No TAKE 1 TABLET BY MOUTH EVERY DAY AT BEDTIME MONDAY THROUGH FRIDAY Moore, Janece, FNP Taking Active   Calcium  Carb-Cholecalciferol (OYSTER SHELL CALCIUM /VITAMIN D ) 500-200 MG-UNIT PACK [id number] No Take by mouth. [provider] Taking Active   carbidopa -levodopa  (SINEMET  IR) 25-100 MG tablet [id number] No Take 1 tablet by mouth at 8am,12pm, 4pm and 8pm. Try to avoid protein. May take an extra  during the day if needed. [name],  FNP Taking Active   dorzolamide-timolol (COSOPT) 2-0.5 % ophthalmic solution [id number] No Place 1 drop into the left eye 2 (two) times daily. [provider] Taking Active   hydrochlorothiazide  (HYDRODIURIL ) 12.5 MG tablet [id number] No TAKE 1 TABLET BY MOUTH EVERY DAY AS NEEDED FOR LEG SWELLING AS DIRECTED [name], FNP Taking Active   QUEtiapine  (SEROQUEL ) 25 MG tablet [id number] No Take 1 tab in am and 2 tabs pm - Dr. Jeneane Miracle  Patient taking differently: Take 1 tab in am and 23tabs pm - Dr. Marina Shy, Abelino Able, FNP Taking Active   Travoprost, BAK Free, (TRAVATAN) 0.004 % SOLN ophthalmic solution [id number] No 1 drop at bedtime. [provider] Taking Active   Vitamin D , Ergocalciferol , (DRISDOL ) 1.25 MG (50000 UNIT) CAPS capsule [id number] No TAKE 1 CAPSULE (50,000 UNITS TOTAL) BY MOUTH EVERY 7 (SEVEN) DAYS [name], FNP Taking Active             Recommendation:   Continue Current Plan of Care  Follow Up Plan:   Telephone follow-up in 1 month  Alease Hunter, LCSW [facility]  [facility], [facility] Clinical Social Worker Direct Dial: [phone]  Fax: [phone] Website: [email or web address] 2:48 PM

## 2023-12-21 NOTE — Patient Instructions (Signed)
 Visit Information  Thank you for taking time to visit with me today. Please don't hesitate to contact me if I can be of assistance to you before our next scheduled appointment.  Our next appointment is by telephone on 07/31 at 2 PM Please call the care guide team at [phone] if you need to cancel or reschedule your appointment.   Following is a copy of your care plan:   Goals Addressed             This Visit's Progress    VBCI Social Work Care Plan   On track    Problems:   Disease Management support and education needs related to Anxiety with Excessive Worry, and Depression: depressed mood  CSW Clinical Goal(s):   Over the next 60 days the Caregiver Patient will attend all scheduled medical appointments as evidenced by patient report and care team review of appointment completion in electronic MEDICAL RECORD NUMBER  demonstrate a reduction in symptoms related to Anxiety with Excessive Worry, Depression: depressed mood .  Interventions:  Mental Health:  Evaluation of current treatment plan related to Anxiety with Excessive Worry, and Depression: depressed mood Active listening / Reflection utilized Caregiver stress acknowledged :  Emotional Support Provided Mindfulness or Relaxation training provided  Patient Goals/Self-Care Activities:  Increase coping skills and healthy habits  Continue to utilize strategies discussed with LCSW, PCP, facility staff to promote safety and well-being and decrease risk of falls  Review supportive resources on caregiver support/Parkinson's Disease   Plan:   Telephone follow up appointment with care management team member scheduled for:  4 weeks        Please call the Suicide and Crisis Lifeline: 988 call 1-800-273-TALK (toll free, 24 hour hotline) call 911 if you are experiencing a Mental Health or Behavioral Health Crisis or need someone to talk to.  Patient verbalizes understanding of instructions and care plan provided today and  agrees to view in MyChart. Active MyChart status and patient understanding of how to access instructions and care plan via MyChart confirmed with patient.     Arlis Bent [facility]  [facility], [facility] Clinical Social Worker Direct Dial: [phone]  Fax: [phone] Website: [email or web address] 2:59 PM

## 2023-12-25 ENCOUNTER — Telehealth: Payer: Self-pay

## 2023-12-25 NOTE — Telephone Encounter (Signed)
 Copied from CRM [id number]. Topic: General - Other >> Dec 25, 2023 11:38 AM Cristopher B wrote: Reason for CRM: kelly from pt nursing home called in to let Dr.Moore know pt fell 15 mins ago today 12/25/2023 and landed on her right side  . She said pt bp was 190 over 80 . Just wanted to give a update and any questions more than welcome to give a call [phone]   Called and spoke with daisy she stated patient not complaining of any pain and they would recheck after she was calmed and settled.

## 2023-12-26 DIAGNOSIS — H269 Unspecified cataract: Secondary | ICD-10-CM | POA: Diagnosis not present

## 2023-12-26 DIAGNOSIS — H3410 Central retinal artery occlusion, unspecified eye: Secondary | ICD-10-CM | POA: Diagnosis not present

## 2023-12-26 DIAGNOSIS — G20A2 Parkinson's disease without dyskinesia, with fluctuations: Secondary | ICD-10-CM | POA: Diagnosis not present

## 2023-12-26 DIAGNOSIS — R32 Unspecified urinary incontinence: Secondary | ICD-10-CM | POA: Diagnosis not present

## 2023-12-26 DIAGNOSIS — Z604 Social exclusion and rejection: Secondary | ICD-10-CM | POA: Diagnosis not present

## 2023-12-26 DIAGNOSIS — Z79899 Other long term (current) drug therapy: Secondary | ICD-10-CM | POA: Diagnosis not present

## 2023-12-26 DIAGNOSIS — N189 Chronic kidney disease, unspecified: Secondary | ICD-10-CM | POA: Diagnosis not present

## 2023-12-26 DIAGNOSIS — M810 Age-related osteoporosis without current pathological fracture: Secondary | ICD-10-CM | POA: Diagnosis not present

## 2023-12-26 DIAGNOSIS — Z7983 Long term (current) use of bisphosphonates: Secondary | ICD-10-CM | POA: Diagnosis not present

## 2023-12-26 DIAGNOSIS — E538 Deficiency of other specified B group vitamins: Secondary | ICD-10-CM | POA: Diagnosis not present

## 2023-12-26 DIAGNOSIS — Z556 Problems related to health literacy: Secondary | ICD-10-CM | POA: Diagnosis not present

## 2023-12-26 DIAGNOSIS — F0280 Dementia in other diseases classified elsewhere without behavioral disturbance: Secondary | ICD-10-CM | POA: Diagnosis not present

## 2023-12-26 DIAGNOSIS — H409 Unspecified glaucoma: Secondary | ICD-10-CM | POA: Diagnosis not present

## 2023-12-26 DIAGNOSIS — E559 Vitamin D deficiency, unspecified: Secondary | ICD-10-CM | POA: Diagnosis not present

## 2023-12-26 DIAGNOSIS — Z9181 History of falling: Secondary | ICD-10-CM | POA: Diagnosis not present

## 2023-12-30 DIAGNOSIS — Z7983 Long term (current) use of bisphosphonates: Secondary | ICD-10-CM | POA: Diagnosis not present

## 2023-12-30 DIAGNOSIS — H409 Unspecified glaucoma: Secondary | ICD-10-CM | POA: Diagnosis not present

## 2023-12-30 DIAGNOSIS — E559 Vitamin D deficiency, unspecified: Secondary | ICD-10-CM | POA: Diagnosis not present

## 2023-12-30 DIAGNOSIS — E538 Deficiency of other specified B group vitamins: Secondary | ICD-10-CM | POA: Diagnosis not present

## 2023-12-30 DIAGNOSIS — Z556 Problems related to health literacy: Secondary | ICD-10-CM | POA: Diagnosis not present

## 2023-12-30 DIAGNOSIS — Z79899 Other long term (current) drug therapy: Secondary | ICD-10-CM | POA: Diagnosis not present

## 2023-12-30 DIAGNOSIS — H269 Unspecified cataract: Secondary | ICD-10-CM | POA: Diagnosis not present

## 2023-12-30 DIAGNOSIS — F0280 Dementia in other diseases classified elsewhere without behavioral disturbance: Secondary | ICD-10-CM | POA: Diagnosis not present

## 2023-12-30 DIAGNOSIS — Z9181 History of falling: Secondary | ICD-10-CM | POA: Diagnosis not present

## 2023-12-30 DIAGNOSIS — M810 Age-related osteoporosis without current pathological fracture: Secondary | ICD-10-CM | POA: Diagnosis not present

## 2023-12-30 DIAGNOSIS — Z604 Social exclusion and rejection: Secondary | ICD-10-CM | POA: Diagnosis not present

## 2023-12-30 DIAGNOSIS — H3410 Central retinal artery occlusion, unspecified eye: Secondary | ICD-10-CM | POA: Diagnosis not present

## 2023-12-30 DIAGNOSIS — N189 Chronic kidney disease, unspecified: Secondary | ICD-10-CM | POA: Diagnosis not present

## 2023-12-30 DIAGNOSIS — R32 Unspecified urinary incontinence: Secondary | ICD-10-CM | POA: Diagnosis not present

## 2023-12-30 DIAGNOSIS — G20A2 Parkinson's disease without dyskinesia, with fluctuations: Secondary | ICD-10-CM | POA: Diagnosis not present

## 2024-01-01 ENCOUNTER — Telehealth: Payer: Self-pay

## 2024-01-01 NOTE — Telephone Encounter (Signed)
 Called nursing home and LVM for [facility] [place]. Called to see how patients blood pressure has been running since fall on Saturday.

## 2024-01-01 NOTE — Telephone Encounter (Signed)
 Erminio returned call and stated she would get a full set of vitals on patient and give me a call back.

## 2024-01-02 ENCOUNTER — Ambulatory Visit (INDEPENDENT_AMBULATORY_CARE_PROVIDER_SITE_OTHER): Admitting: Nurse Practitioner

## 2024-01-02 VITALS — BP 120/74 | HR 65 | Temp 98.6°F | Ht 66.0 in | Wt 117.4 lb

## 2024-01-02 DIAGNOSIS — G20A2 Parkinson's disease without dyskinesia, with fluctuations: Secondary | ICD-10-CM | POA: Diagnosis not present

## 2024-01-02 DIAGNOSIS — R82998 Other abnormal findings in urine: Secondary | ICD-10-CM

## 2024-01-02 DIAGNOSIS — R413 Other amnesia: Secondary | ICD-10-CM

## 2024-01-02 DIAGNOSIS — R296 Repeated falls: Secondary | ICD-10-CM | POA: Diagnosis not present

## 2024-01-02 LAB — POCT URINALYSIS DIP (CLINITEK)
Bilirubin, UA: NEGATIVE
Blood, UA: NEGATIVE
Glucose, UA: NEGATIVE mg/dL
Ketones, POC UA: NEGATIVE mg/dL
Nitrite, UA: NEGATIVE
Spec Grav, UA: 1.025 (ref 1.010–1.025)
Urobilinogen, UA: 2 U/dL — AB
pH, UA: 7 (ref 5.0–8.0)

## 2024-01-02 NOTE — Progress Notes (Signed)
 LILLETTE Kristeen JINNY Gladis, CMA,acting as a Neurosurgeon for Denise Ada, FNP.,have documented all relevant documentation on the behalf of Denise Ada, FNP,as directed by  Denise Ada, FNP while in the presence of Denise Ada, FNP.  Subjective:  Patient ID: Denise [name] , female    DOB: Apr 08, 1951 , 73 y.o.   MRN: [id number]  Chief Complaint  Patient presents with   Fall    Patient presents today for falls, patient reports she has had many falls. She has had 2 falls this week. She has one scrap on her right buttocks, she also had a lot of bruising on her left hip. Patient reports she feels fine but she is a little sore.    Cough    Patient reports she has had increased coughing, she notices it more when eating and drinking.     HPI  She has had another fall today with her dirty clothes hamper. She started holding on to it more. She took 2 steps and fell on the floor. She has some soreness. She was ambulatory. She has a scrape on her buttocks, cleaned by her daughter and covered. She had fell the last time and now has a bruise to left hip area. She continues to do therapy and they are aware of her frequent falls.      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     Family History  Problem Relation Age of Onset   Hypertension Mother    Heart disease Mother    Diabetes Father    Hypertension Father    Other Father        had a condition that was a cousin to ALS    Diabetes Sister    Anemia Sister    Tuberculosis Maternal Grandmother    Heart disease Sister    Colitis Daughter    Colon cancer Neg Hx    Colon polyps Neg Hx    Esophageal cancer Neg Hx    Rectal cancer Neg Hx    Stomach cancer Neg Hx      Current Outpatient Medications:    alendronate  (FOSAMAX ) 70 MG tablet, TAKE 1 TABLET (70 MG TOTAL) BY MOUTH EVERY 7 DAYS  WITH FULL GLASS WATER ON EMPTY STOMACH, Disp: 12 tablet, Rfl: 1   atorvastatin  (LIPITOR) 10 MG tablet, TAKE 1 TABLET BY MOUTH EVERY DAY AT BEDTIME MONDAY THROUGH FRIDAY, Disp: 65 tablet, Rfl: 4   Calcium  Carb-Cholecalciferol (OYSTER SHELL CALCIUM /VITAMIN D ) 500-200 MG-UNIT PACK, Take by mouth., Disp: , Rfl:    carbidopa -levodopa  (SINEMET  IR) 25-100 MG tablet, Take 1 tablet by mouth at 8am,12pm, 4pm and 8pm. Try to avoid protein. May take an extra during the day if needed., Disp: , Rfl:    dorzolamide-timolol (COSOPT) 2-0.5 % ophthalmic solution, Place 1 drop into the left eye 2 (two) times daily., Disp: , Rfl:    hydrochlorothiazide  (HYDRODIURIL ) 12.5 MG tablet, TAKE 1 TABLET BY MOUTH EVERY DAY AS NEEDED FOR LEG SWELLING AS DIRECTED, Disp: 90 tablet, Rfl: 1   QUEtiapine  (SEROQUEL ) 25 MG tablet, Take 1 tab in am and 2 tabs pm - Dr. Lori (Patient taking differently: Take 1 tab in am and 3tabs pm - Dr. Lori), Disp: , Rfl:    Travoprost, BAK Free, (  TRAVATAN) 0.004 % SOLN ophthalmic solution, 1 drop at bedtime., Disp: , Rfl:    Vitamin D , Ergocalciferol , (DRISDOL ) 1.25 MG (50000 UNIT) CAPS capsule, TAKE 1 CAPSULE (50,000 UNITS TOTAL) BY MOUTH EVERY 7 (SEVEN) DAYS, Disp: 12 capsule, Rfl: 0   Allergies  Allergen Reactions   Nystatin -Triamcinolone  Swelling    BLISTERS   Triamcinolone  Acetonide     Blisters    Cephalexin  Itching and Rash     Review of Systems  Constitutional: Negative.   Respiratory: Negative.    Cardiovascular: Negative.   Neurological:        Memory changes  Psychiatric/Behavioral: Negative.       Today's Vitals   01/02/24 1549  BP: 120/74  Pulse: 65  Temp: 98.6 F (37 C)  TempSrc: Oral  Weight: 117 lb 6.4 oz (53.3 kg)  Height: 5' 6 (1.676 m)  PainSc: 6   PainLoc: Hip   Body mass index is 18.95 kg/m.  Wt Readings from Last 3 Encounters:  01/02/24 117 lb 6.4 oz (53.3 kg)  11/15/23 123 lb (55.8 kg)  10/24/23 127 lb 3.2 oz (57.7 kg)      Objective:   Physical Exam Vitals and nursing note reviewed.  Constitutional:      General: She is not in acute distress.    Appearance: Normal appearance. She is well-developed.     Comments: Thin appearance  HENT:     Head: Normocephalic and atraumatic.  Eyes:     Pupils: Pupils are equal, round, and reactive to light.  Pulmonary:     Effort: Pulmonary effort is normal. No respiratory distress.  Musculoskeletal:        General: Normal range of motion.     Comments: Slow gait and using rolling walker  Skin:    General: Skin is warm.     Findings: Bruising (left leg) present.  Neurological:     General: No focal deficit present.     Mental Status: She is alert and oriented to person, place, and time.     Cranial Nerves: No cranial nerve deficit.     Motor: No weakness.  Psychiatric:        Mood and Affect: Mood normal.         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 She did have a bruise to her left leg color is green/yellow  Orders: -     POCT URINALYSIS DIP (CLINITEK) -     Urine Culture -     MR BRAIN W WO CONTRAST; Future  Leukocytes in urine Assessment & Plan: Will send urine culture  Orders: -     Urine Culture  Memory impairment Assessment & Plan: Daughter is concerned about her memory worsening and she is having more frequent falls. Will check MRI of brain to evaluate for possible progression of her parkinsons  Orders: -     MR BRAIN W WO CONTRAST; Future    Return for keep same next at request.  Patient was given opportunity to ask questions. Patient verbalized understanding of the plan and was able to repeat key elements of the plan. All questions were  answered to their satisfaction.  LILLETTE Denise Ada, FNP, have reviewed all documentation for this visit. The documentation on 01/02/24 for the exam, diagnosis, procedures, and orders are all accurate and complete.  IF YOU HAVE BEEN REFERRED TO A SPECIALIST, IT MAY TAKE 1-2 WEEKS TO SCHEDULE/PROCESS THE REFERRAL. IF YOU HAVE NOT HEARD FROM US /SPECIALIST IN TWO WEEKS, PLEASE GIVE US  A CALL AT [phone].

## 2024-01-03 LAB — VITAMIN B12: Vitamin B-12: 412 pg/mL (ref 232–1245)

## 2024-01-03 LAB — TSH: TSH: 0.572 u[IU]/mL (ref 0.450–4.500)

## 2024-01-04 DIAGNOSIS — Z7983 Long term (current) use of bisphosphonates: Secondary | ICD-10-CM | POA: Diagnosis not present

## 2024-01-04 DIAGNOSIS — Z79899 Other long term (current) drug therapy: Secondary | ICD-10-CM | POA: Diagnosis not present

## 2024-01-04 DIAGNOSIS — F0280 Dementia in other diseases classified elsewhere without behavioral disturbance: Secondary | ICD-10-CM | POA: Diagnosis not present

## 2024-01-04 DIAGNOSIS — Z9181 History of falling: Secondary | ICD-10-CM | POA: Diagnosis not present

## 2024-01-04 DIAGNOSIS — E538 Deficiency of other specified B group vitamins: Secondary | ICD-10-CM | POA: Diagnosis not present

## 2024-01-04 DIAGNOSIS — M810 Age-related osteoporosis without current pathological fracture: Secondary | ICD-10-CM | POA: Diagnosis not present

## 2024-01-04 DIAGNOSIS — E559 Vitamin D deficiency, unspecified: Secondary | ICD-10-CM | POA: Diagnosis not present

## 2024-01-04 DIAGNOSIS — H269 Unspecified cataract: Secondary | ICD-10-CM | POA: Diagnosis not present

## 2024-01-04 DIAGNOSIS — Z604 Social exclusion and rejection: Secondary | ICD-10-CM | POA: Diagnosis not present

## 2024-01-04 DIAGNOSIS — G20A2 Parkinson's disease without dyskinesia, with fluctuations: Secondary | ICD-10-CM | POA: Diagnosis not present

## 2024-01-04 DIAGNOSIS — H3410 Central retinal artery occlusion, unspecified eye: Secondary | ICD-10-CM | POA: Diagnosis not present

## 2024-01-04 DIAGNOSIS — N189 Chronic kidney disease, unspecified: Secondary | ICD-10-CM | POA: Diagnosis not present

## 2024-01-04 DIAGNOSIS — H409 Unspecified glaucoma: Secondary | ICD-10-CM | POA: Diagnosis not present

## 2024-01-04 DIAGNOSIS — R32 Unspecified urinary incontinence: Secondary | ICD-10-CM | POA: Diagnosis not present

## 2024-01-04 DIAGNOSIS — Z556 Problems related to health literacy: Secondary | ICD-10-CM | POA: Diagnosis not present

## 2024-01-05 LAB — URINE CULTURE

## 2024-01-06 ENCOUNTER — Ambulatory Visit: Payer: Self-pay | Admitting: Nurse Practitioner

## 2024-01-06 DIAGNOSIS — N3000 Acute cystitis without hematuria: Secondary | ICD-10-CM

## 2024-01-06 MED ORDER — SULFAMETHOXAZOLE-TRIMETHOPRIM 800-160 MG PO TABS
1.0000 | ORAL_TABLET | Freq: Two times a day (BID) | ORAL | 0 refills | Status: AC
Start: 2024-01-06 — End: 2024-01-09

## 2024-01-07 ENCOUNTER — Other Ambulatory Visit: Payer: Self-pay | Admitting: Nurse Practitioner

## 2024-01-08 DIAGNOSIS — E538 Deficiency of other specified B group vitamins: Secondary | ICD-10-CM | POA: Diagnosis not present

## 2024-01-08 DIAGNOSIS — Z604 Social exclusion and rejection: Secondary | ICD-10-CM | POA: Diagnosis not present

## 2024-01-08 DIAGNOSIS — Z7983 Long term (current) use of bisphosphonates: Secondary | ICD-10-CM | POA: Diagnosis not present

## 2024-01-08 DIAGNOSIS — G20A2 Parkinson's disease without dyskinesia, with fluctuations: Secondary | ICD-10-CM | POA: Diagnosis not present

## 2024-01-08 DIAGNOSIS — H269 Unspecified cataract: Secondary | ICD-10-CM | POA: Diagnosis not present

## 2024-01-08 DIAGNOSIS — H3410 Central retinal artery occlusion, unspecified eye: Secondary | ICD-10-CM | POA: Diagnosis not present

## 2024-01-08 DIAGNOSIS — R32 Unspecified urinary incontinence: Secondary | ICD-10-CM | POA: Diagnosis not present

## 2024-01-08 DIAGNOSIS — Z556 Problems related to health literacy: Secondary | ICD-10-CM | POA: Diagnosis not present

## 2024-01-08 DIAGNOSIS — Z9181 History of falling: Secondary | ICD-10-CM | POA: Diagnosis not present

## 2024-01-08 DIAGNOSIS — Z79899 Other long term (current) drug therapy: Secondary | ICD-10-CM | POA: Diagnosis not present

## 2024-01-08 DIAGNOSIS — E559 Vitamin D deficiency, unspecified: Secondary | ICD-10-CM | POA: Diagnosis not present

## 2024-01-08 DIAGNOSIS — M810 Age-related osteoporosis without current pathological fracture: Secondary | ICD-10-CM | POA: Diagnosis not present

## 2024-01-08 DIAGNOSIS — N189 Chronic kidney disease, unspecified: Secondary | ICD-10-CM | POA: Diagnosis not present

## 2024-01-08 DIAGNOSIS — F0280 Dementia in other diseases classified elsewhere without behavioral disturbance: Secondary | ICD-10-CM | POA: Diagnosis not present

## 2024-01-08 DIAGNOSIS — H409 Unspecified glaucoma: Secondary | ICD-10-CM | POA: Diagnosis not present

## 2024-01-08 NOTE — Telephone Encounter (Signed)
 Please send them an updated medication list and star the new medication which is the antibiotic.

## 2024-01-08 NOTE — Telephone Encounter (Signed)
 Copied from CRM [id number]. Topic: Clinical - Medication Question >> Jan 08, 2024  1:22 PM Willma R wrote: Reason for CRM: Cathlean care manager at [facility] is calling requesting orders for sulfamethoxazole -trimethoprim  (BACTRIM  DS) 800-160 MG table be sent to them so she can add the medication into matrix so they can provide it to the patient.  Cathlean can be reached at [phone] Fax [phone]  Orders faxed.

## 2024-01-11 ENCOUNTER — Encounter: Payer: Self-pay | Admitting: Nurse Practitioner

## 2024-01-12 ENCOUNTER — Other Ambulatory Visit: Payer: Self-pay | Admitting: Family Medicine

## 2024-01-12 ENCOUNTER — Telehealth: Payer: Self-pay

## 2024-01-12 ENCOUNTER — Ambulatory Visit: Payer: Self-pay

## 2024-01-12 DIAGNOSIS — R001 Bradycardia, unspecified: Secondary | ICD-10-CM | POA: Diagnosis not present

## 2024-01-12 DIAGNOSIS — F0280 Dementia in other diseases classified elsewhere without behavioral disturbance: Secondary | ICD-10-CM | POA: Diagnosis not present

## 2024-01-12 DIAGNOSIS — R079 Chest pain, unspecified: Secondary | ICD-10-CM | POA: Diagnosis not present

## 2024-01-12 DIAGNOSIS — M79671 Pain in right foot: Secondary | ICD-10-CM | POA: Diagnosis not present

## 2024-01-12 DIAGNOSIS — M2011 Hallux valgus (acquired), right foot: Secondary | ICD-10-CM | POA: Diagnosis not present

## 2024-01-12 DIAGNOSIS — M542 Cervicalgia: Secondary | ICD-10-CM | POA: Diagnosis not present

## 2024-01-12 DIAGNOSIS — M25551 Pain in right hip: Secondary | ICD-10-CM | POA: Diagnosis not present

## 2024-01-12 DIAGNOSIS — W19XXXA Unspecified fall, initial encounter: Secondary | ICD-10-CM | POA: Diagnosis not present

## 2024-01-12 DIAGNOSIS — Z043 Encounter for examination and observation following other accident: Secondary | ICD-10-CM | POA: Diagnosis not present

## 2024-01-12 DIAGNOSIS — R102 Pelvic and perineal pain: Secondary | ICD-10-CM | POA: Diagnosis not present

## 2024-01-12 DIAGNOSIS — G9389 Other specified disorders of brain: Secondary | ICD-10-CM | POA: Diagnosis not present

## 2024-01-12 DIAGNOSIS — G20A1 Parkinson's disease without dyskinesia, without mention of fluctuations: Secondary | ICD-10-CM | POA: Diagnosis not present

## 2024-01-12 DIAGNOSIS — Z79899 Other long term (current) drug therapy: Secondary | ICD-10-CM | POA: Diagnosis not present

## 2024-01-12 NOTE — Telephone Encounter (Signed)
 Called patients daughters Olam to check in after a reported fall at 3am. Patient fell out of bed and has been checked out at [facility]. Olam stated her all her testing was good and no broken bones. Patient is headed back to [facility].

## 2024-01-14 ENCOUNTER — Encounter: Payer: Self-pay | Admitting: Nurse Practitioner

## 2024-01-14 DIAGNOSIS — R413 Other amnesia: Secondary | ICD-10-CM | POA: Insufficient documentation

## 2024-01-14 NOTE — Assessment & Plan Note (Signed)
 Continue f/u with Neurology and Parkinson's specialist

## 2024-01-14 NOTE — Assessment & Plan Note (Signed)
 She has been falling more often so will check urinalysis. She did have a bruise to her left leg color is green/yellow

## 2024-01-14 NOTE — Assessment & Plan Note (Signed)
 Daughter is concerned about her memory worsening and she is having more frequent falls. Will check MRI of brain to evaluate for possible progression of her parkinsons

## 2024-01-14 NOTE — Assessment & Plan Note (Signed)
Will send urine culture

## 2024-01-15 ENCOUNTER — Encounter: Payer: Self-pay | Admitting: Nurse Practitioner

## 2024-01-15 DIAGNOSIS — R001 Bradycardia, unspecified: Secondary | ICD-10-CM | POA: Diagnosis not present

## 2024-01-23 ENCOUNTER — Ambulatory Visit: Payer: Self-pay

## 2024-01-23 ENCOUNTER — Encounter: Payer: Self-pay | Admitting: Nurse Practitioner

## 2024-01-24 ENCOUNTER — Encounter: Payer: Self-pay | Admitting: Nurse Practitioner

## 2024-01-25 ENCOUNTER — Telehealth: Payer: Self-pay

## 2024-01-25 ENCOUNTER — Other Ambulatory Visit: Payer: Self-pay | Admitting: Nurse Practitioner

## 2024-01-25 DIAGNOSIS — N1831 Chronic kidney disease, stage 3a: Secondary | ICD-10-CM

## 2024-01-25 DIAGNOSIS — G20A2 Parkinson's disease without dyskinesia, with fluctuations: Secondary | ICD-10-CM

## 2024-01-25 DIAGNOSIS — E559 Vitamin D deficiency, unspecified: Secondary | ICD-10-CM

## 2024-01-25 DIAGNOSIS — F0280 Dementia in other diseases classified elsewhere without behavioral disturbance: Secondary | ICD-10-CM

## 2024-01-25 DIAGNOSIS — M810 Age-related osteoporosis without current pathological fracture: Secondary | ICD-10-CM

## 2024-01-25 DIAGNOSIS — H409 Unspecified glaucoma: Secondary | ICD-10-CM

## 2024-01-25 DIAGNOSIS — E538 Deficiency of other specified B group vitamins: Secondary | ICD-10-CM

## 2024-01-25 NOTE — Telephone Encounter (Signed)
 Copy of written info from [facility] order given to patient's daughter Olam.

## 2024-01-25 NOTE — Telephone Encounter (Signed)
 Copied from CRM [id number]. Topic: General - Other >> Jan 25, 2024  9:05 AM Marissa P wrote: Reason for CRM: [facility]ard [facility] called to let us  know that she slid off her bed and had another fall this morning. Looks fine, vitals good and can walk. Just had slight pain left side. But she is fine now it was just when they pushed on it but she is currently fine.

## 2024-01-25 NOTE — Progress Notes (Unsigned)
 Chief Complaint  Patient presents with   Initial Home Health Certififcation   Received home health orders orders from [facility] [place]. Start of care 11/30/2023.   Certification and orders from 11/30/2023 through 01/28/2024 are reviewed, signed and faxed back to home health company.  Need of intermittent skilled services at home: PT  The home health care plan has been established by me and will be reviewed and updated as needed to maximize patient recovery.  I certify that all home health services have been and will be furnished to the patient while under my care.  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

## 2024-01-26 DIAGNOSIS — R443 Hallucinations, unspecified: Secondary | ICD-10-CM | POA: Diagnosis not present

## 2024-01-26 DIAGNOSIS — G20A2 Parkinson's disease without dyskinesia, with fluctuations: Secondary | ICD-10-CM | POA: Diagnosis not present

## 2024-01-28 ENCOUNTER — Ambulatory Visit
Admission: RE | Admit: 2024-01-28 | Discharge: 2024-01-28 | Disposition: A | Discharge status: Home / Self Care | Source: Ambulatory Visit | Attending: Nurse Practitioner | Admitting: Nurse Practitioner

## 2024-01-28 DIAGNOSIS — R413 Other amnesia: Secondary | ICD-10-CM

## 2024-01-28 DIAGNOSIS — G20A2 Parkinson's disease without dyskinesia, with fluctuations: Secondary | ICD-10-CM

## 2024-01-28 DIAGNOSIS — R296 Repeated falls: Secondary | ICD-10-CM

## 2024-01-28 MED ORDER — GADOPICLENOL 0.5 MMOL/ML IV SOLN
5.5000 mL | Freq: Once | INTRAVENOUS | Status: AC | PRN
  Administered 2024-01-28: 5.5 mL via INTRAVENOUS

## 2024-01-29 ENCOUNTER — Encounter: Payer: Self-pay | Admitting: Nurse Practitioner

## 2024-01-30 DIAGNOSIS — L539 Erythematous condition, unspecified: Secondary | ICD-10-CM | POA: Diagnosis not present

## 2024-01-30 DIAGNOSIS — S50862A Insect bite (nonvenomous) of left forearm, initial encounter: Secondary | ICD-10-CM | POA: Diagnosis not present

## 2024-01-30 DIAGNOSIS — Y92129 Unspecified place in nursing home as the place of occurrence of the external cause: Secondary | ICD-10-CM | POA: Diagnosis not present

## 2024-01-30 DIAGNOSIS — R21 Rash and other nonspecific skin eruption: Secondary | ICD-10-CM | POA: Diagnosis not present

## 2024-01-30 DIAGNOSIS — R519 Headache, unspecified: Secondary | ICD-10-CM | POA: Diagnosis not present

## 2024-01-30 DIAGNOSIS — S199XXA Unspecified injury of neck, initial encounter: Secondary | ICD-10-CM | POA: Diagnosis not present

## 2024-01-30 DIAGNOSIS — W19XXXA Unspecified fall, initial encounter: Secondary | ICD-10-CM | POA: Diagnosis not present

## 2024-01-30 DIAGNOSIS — S0003XA Contusion of scalp, initial encounter: Secondary | ICD-10-CM | POA: Diagnosis not present

## 2024-01-30 NOTE — ED Notes (Signed)
 Patient advocate spoke with support person(daughter). There are no concerns or questions at this time. Patient lying quietly.   Denise [name] 01/30/24 1337

## 2024-01-30 NOTE — ED Notes (Signed)
 Patient advocate spoke with support person(daughter),They are waiting to see a provider. There are no concerns or questions at this time. Patient is lying quietly.   Denise [name] 01/30/24 1053

## 2024-01-30 NOTE — ED Triage Notes (Addendum)
 Patient BIB [facility] with report of unwitnessed fall at SNF this evening. C/o right sided head pain denies LOC

## 2024-02-01 ENCOUNTER — Encounter: Payer: Self-pay | Admitting: Licensed Clinical Social Worker

## 2024-02-01 ENCOUNTER — Telehealth: Payer: Self-pay | Admitting: Licensed Clinical Social Worker

## 2024-02-06 ENCOUNTER — Telehealth: Payer: Self-pay

## 2024-02-06 NOTE — Telephone Encounter (Signed)
 Copied from CRM #[id number]. Topic: General - Other >> Feb 06, 2024 10:20 AM Antwanette L wrote: Reason for CRM: Gwenn from [facility] is calling b/c she faxed over 3 fall reports on 7/23, 7/24, and 7/30. Gwenn needs [name] to sign off on the forms. Gwenn is requesting a callback at [phone]

## 2024-02-07 ENCOUNTER — Encounter: Payer: Self-pay | Admitting: Nurse Practitioner

## 2024-02-07 ENCOUNTER — Telehealth: Payer: Self-pay

## 2024-02-07 NOTE — Telephone Encounter (Signed)
 Copied from CRM #[id number]. Topic: General - Other >> Feb 07, 2024  9:08 AM Charlet HERO wrote: Reason for CRM: Adrien is calling from [facility] senior living she is calling to report that the patient had a fall with no injury.

## 2024-02-08 NOTE — Patient Instructions (Signed)
 Rilla Hint - I am sorry I was unable to reach you today for our scheduled appointment. I work with Georgina Speaks, FNP and am calling to support your healthcare needs. Please contact me at [phone] at your earliest convenience. I look forward to speaking with you soon.   Thank you,  Rolin Kerns, LCSW [place]  [facility], [facility] Clinical Social Worker Direct Dial: [phone]  Fax: [phone] Website: [email or web address] 8:42 AM

## 2024-02-09 DIAGNOSIS — S199XXA Unspecified injury of neck, initial encounter: Secondary | ICD-10-CM | POA: Diagnosis not present

## 2024-02-09 DIAGNOSIS — W1830XA Fall on same level, unspecified, initial encounter: Secondary | ICD-10-CM | POA: Diagnosis not present

## 2024-02-09 DIAGNOSIS — Z043 Encounter for examination and observation following other accident: Secondary | ICD-10-CM | POA: Diagnosis not present

## 2024-02-09 DIAGNOSIS — M79601 Pain in right arm: Secondary | ICD-10-CM | POA: Diagnosis not present

## 2024-02-09 DIAGNOSIS — M19011 Primary osteoarthritis, right shoulder: Secondary | ICD-10-CM | POA: Diagnosis not present

## 2024-02-09 DIAGNOSIS — M79621 Pain in right upper arm: Secondary | ICD-10-CM | POA: Diagnosis not present

## 2024-02-09 DIAGNOSIS — R519 Headache, unspecified: Secondary | ICD-10-CM | POA: Diagnosis not present

## 2024-02-09 DIAGNOSIS — Y92129 Unspecified place in nursing home as the place of occurrence of the external cause: Secondary | ICD-10-CM | POA: Diagnosis not present

## 2024-02-09 DIAGNOSIS — F0390 Unspecified dementia without behavioral disturbance: Secondary | ICD-10-CM | POA: Diagnosis not present

## 2024-02-09 DIAGNOSIS — S0083XA Contusion of other part of head, initial encounter: Secondary | ICD-10-CM | POA: Diagnosis not present

## 2024-02-09 DIAGNOSIS — M19021 Primary osteoarthritis, right elbow: Secondary | ICD-10-CM | POA: Diagnosis not present

## 2024-02-09 DIAGNOSIS — Y939 Activity, unspecified: Secondary | ICD-10-CM | POA: Diagnosis not present

## 2024-02-12 ENCOUNTER — Encounter: Payer: Self-pay | Admitting: Nurse Practitioner

## 2024-02-12 ENCOUNTER — Ambulatory Visit: Admitting: Nurse Practitioner

## 2024-02-12 VITALS — BP 100/60 | HR 84 | Temp 98.4°F | Ht 66.0 in | Wt 118.4 lb

## 2024-02-12 DIAGNOSIS — G309 Alzheimer's disease, unspecified: Secondary | ICD-10-CM | POA: Diagnosis not present

## 2024-02-12 DIAGNOSIS — G20A2 Parkinson's disease without dyskinesia, with fluctuations: Secondary | ICD-10-CM | POA: Diagnosis not present

## 2024-02-12 DIAGNOSIS — R296 Repeated falls: Secondary | ICD-10-CM | POA: Diagnosis not present

## 2024-02-12 DIAGNOSIS — R82998 Other abnormal findings in urine: Secondary | ICD-10-CM | POA: Diagnosis not present

## 2024-02-12 DIAGNOSIS — F0280 Dementia in other diseases classified elsewhere without behavioral disturbance: Secondary | ICD-10-CM

## 2024-02-12 LAB — POCT URINALYSIS DIP (CLINITEK)
Bilirubin, UA: NEGATIVE
Blood, UA: NEGATIVE
Glucose, UA: NEGATIVE mg/dL
Nitrite, UA: NEGATIVE
POC PROTEIN,UA: NEGATIVE
Spec Grav, UA: 1.02 (ref 1.010–1.025)
Urobilinogen, UA: 1 U/dL
pH, UA: 6 (ref 5.0–8.0)

## 2024-02-12 NOTE — Progress Notes (Signed)
 LILLETTE Kristeen JINNY Gladis, CMA,acting as a Neurosurgeon for [name], FNP.,have documented all relevant documentation on the behalf of [name], FNP,as directed by  [name], FNP while in the presence of [name], FNP.  Subjective:  Patient ID: Denise [name] , female    DOB: [birth year] , 73 y.o.   MRN: [id number]  Chief Complaint  Patient presents with   Fall    Patient presents today for falls, patient reports having 2 falls within the last week. She reports Friday she did hit her head with her last fall. She reports she did fall and hit her face which lead to bruises and a black eye on her right side.    HPI  She is here today due to frequent falls. Her daughter is trying to get an alarm device and a camera. She has increased her seroquel  2 in am and 3 at pm when she is not herself she will miss her medications. She had been calling 911 in the morning. What should she take when not sleeping - try melatonin. She also talked about starting her on donepezil. Would like to wait until they get the monitoring and then readress.   She has been sleeping the last 2 days until 11am after her most recent hospital visit   Her daughter did show me a picture of one of her recent falls that showed a black eye days later.   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     Family History  Problem Relation Age of Onset   Hypertension Mother    Heart disease Mother    Diabetes Father    Hypertension Father    Other Father        had a condition that was a cousin to ALS    Diabetes Sister    Anemia Sister    Tuberculosis Maternal Grandmother    Heart disease Sister    Colitis Daughter    Colon cancer Neg Hx    Colon polyps Neg Hx    Esophageal cancer Neg Hx    Rectal cancer Neg Hx    Stomach cancer Neg Hx       Current Outpatient Medications:    alendronate  (FOSAMAX ) 70 MG tablet, TAKE 1 TABLET (70 MG TOTAL) BY MOUTH EVERY 7 DAYS WITH FULL GLASS WATER ON EMPTY STOMACH, Disp: 12 tablet, Rfl: 1   atorvastatin  (LIPITOR) 10 MG tablet, TAKE 1 TABLET BY MOUTH EVERY DAY AT BEDTIME MONDAY THROUGH FRIDAY, Disp: 65 tablet, Rfl: 4   Calcium  Carb-Cholecalciferol (OYSTER SHELL CALCIUM /VITAMIN D ) 500-200 MG-UNIT PACK, Take by mouth., Disp: , Rfl:    carbidopa -levodopa  (SINEMET  IR) 25-100 MG tablet, Take 1 tablet by mouth at 8am,12pm, 4pm and 8pm. Try to avoid protein. May take an extra during the day if needed., Disp: , Rfl:    dorzolamide-timolol (COSOPT) 2-0.5 % ophthalmic solution, Place 1 drop into the left eye 2 (two) times daily., Disp: , Rfl:    hydrochlorothiazide  (HYDRODIURIL ) 12.5 MG tablet, TAKE 1 TABLET BY MOUTH EVERY DAY AS NEEDED FOR LEG SWELLING AS DIRECTED, Disp: 90 tablet, Rfl: 1   QUEtiapine  (SEROQUEL ) 25 MG tablet, Take 1 tab in am and 2 tabs pm - Dr. Lori (Patient  taking differently: Take 1 tab in am and 3tabs pm - Dr. Lori), Disp: , Rfl:    Travoprost, BAK Free, (TRAVATAN) 0.004 % SOLN ophthalmic solution, 1 drop at bedtime., Disp: , Rfl:    Vitamin D , Ergocalciferol , (DRISDOL ) 1.25 MG (50000 UNIT) CAPS capsule, TAKE 1 CAPSULE (50,000 UNITS TOTAL) BY MOUTH EVERY 7 (SEVEN) DAYS, Disp: 12 capsule, Rfl: 0   Allergies  Allergen Reactions   Nystatin -Triamcinolone  Swelling    BLISTERS   Triamcinolone  Acetonide     Blisters    Cephalexin  Itching and Rash     Review of Systems  Constitutional: Negative.   Respiratory: Negative.    Cardiovascular: Negative.   Neurological: Negative.   Psychiatric/Behavioral: Negative.       Today's Vitals   02/12/24 1619  BP: 100/60  Pulse: 84  Temp: 98.4 F (36.9 C)  TempSrc: Oral  Weight: 118 lb 6.4 oz (53.7 kg)  Height: 5' 6 (1.676 m)  PainSc: 0-No pain   Body mass index is 19.11 kg/m.  Wt Readings from Last 3 Encounters:   02/12/24 118 lb 6.4 oz (53.7 kg)  01/02/24 117 lb 6.4 oz (53.3 kg)  11/15/23 123 lb (55.8 kg)    Objective:  Physical Exam Vitals and nursing note reviewed.  Constitutional:      General: She is not in acute distress.    Appearance: Normal appearance. She is well-developed.     Comments: Thin appearance  HENT:     Head: Normocephalic and atraumatic.  Eyes:     Pupils: Pupils are equal, round, and reactive to light.  Cardiovascular:     Rate and Rhythm: Normal rate and regular rhythm.     Pulses: Normal pulses.     Heart sounds: Normal heart sounds. No murmur heard. Pulmonary:     Effort: Pulmonary effort is normal. No respiratory distress.     Breath sounds: Normal breath sounds.  Musculoskeletal:        General: Normal range of motion.     Comments: Slow gait and using rolling walker  Skin:    General: Skin is warm.     Capillary Refill: Capillary refill takes less than 2 seconds.     Findings: No bruising.  Neurological:     General: No focal deficit present.     Mental Status: She is alert and oriented to person, place, and time.     Cranial Nerves: No cranial nerve deficit.     Motor: No weakness.  Psychiatric:        Mood and Affect: Mood normal.         Assessment And Plan:  Frequent falls Assessment & Plan: She has had another fall. Daughter is planning to get cameras to put in room overnight to see what is happening with her falls. I have signed several copies of previous falls for the facility as well  Orders: -     POCT URINALYSIS DIP (CLINITEK) -     Urine Culture  Parkinson's disease without dyskinesia, with fluctuations (HCC) [G20.A2] Assessment & Plan: She continues to see Dr. Euell to make any changes to her medications. I feel her falls are related to worsening symptoms of her Parkinson's.    Alzheimer's disease (HCC) [G30.9, F02.80] Assessment & Plan: Continue f/u with Neurologist and Neuropsych   Urine white blood cells  increased Assessment & Plan: Will send for urine culture to see if she has a urinary tract infection  Orders: -     Urine Culture  Return for keep same next.  Patient was given opportunity to ask questions. Patient verbalized understanding of the plan and was able to repeat key elements of the plan. All questions were answered to their satisfaction.    [name], FNP, have reviewed all documentation for this visit. The documentation on 02/12/24 for the exam, diagnosis, procedures, and orders are all accurate and complete.   IF YOU HAVE BEEN REFERRED TO A SPECIALIST, IT MAY TAKE 1-2 WEEKS TO SCHEDULE/PROCESS THE REFERRAL. IF YOU HAVE NOT HEARD FROM US /SPECIALIST IN TWO WEEKS, PLEASE GIVE US  A CALL AT [phone] X [phone].

## 2024-02-13 ENCOUNTER — Encounter: Payer: Self-pay | Admitting: Nurse Practitioner

## 2024-02-13 DIAGNOSIS — F333 Major depressive disorder, recurrent, severe with psychotic symptoms: Secondary | ICD-10-CM | POA: Diagnosis not present

## 2024-02-14 LAB — URINE CULTURE

## 2024-02-19 ENCOUNTER — Encounter: Payer: Self-pay | Admitting: Physical Therapy

## 2024-02-25 ENCOUNTER — Ambulatory Visit: Payer: Self-pay | Admitting: Nurse Practitioner

## 2024-02-25 DIAGNOSIS — R82998 Other abnormal findings in urine: Secondary | ICD-10-CM | POA: Insufficient documentation

## 2024-02-25 DIAGNOSIS — F0280 Dementia in other diseases classified elsewhere without behavioral disturbance: Secondary | ICD-10-CM | POA: Insufficient documentation

## 2024-02-25 DIAGNOSIS — G20A2 Parkinson's disease without dyskinesia, with fluctuations: Secondary | ICD-10-CM | POA: Insufficient documentation

## 2024-02-25 NOTE — Assessment & Plan Note (Signed)
 Will send for urine culture to see if she has a urinary tract infection

## 2024-02-25 NOTE — Assessment & Plan Note (Addendum)
 She has had another fall. Daughter is planning to get cameras to put in room overnight to see what is happening with her falls. I have signed several copies of previous falls for the facility as well

## 2024-02-25 NOTE — Assessment & Plan Note (Signed)
 She continues to see Dr. Euell to make any changes to her medications. I feel her falls are related to worsening symptoms of her Parkinson's.

## 2024-02-26 NOTE — Assessment & Plan Note (Signed)
 Continue f/u with Neurologist and Neuropsych

## 2024-02-27 ENCOUNTER — Ambulatory Visit: Admitting: Nurse Practitioner

## 2024-02-27 ENCOUNTER — Ambulatory Visit: Payer: Medicare Other | Admitting: Occupational Therapy

## 2024-02-27 ENCOUNTER — Ambulatory Visit: Payer: Medicare Other | Attending: Nurse Practitioner | Admitting: Physical Therapy

## 2024-02-27 ENCOUNTER — Other Ambulatory Visit: Payer: Self-pay | Admitting: Nurse Practitioner

## 2024-02-27 ENCOUNTER — Ambulatory Visit: Payer: Medicare Other | Admitting: Speech Pathology

## 2024-02-27 NOTE — Progress Notes (Signed)
 Received order to authorize new Rxs from Dr. Akintayo (psychiatrist) and Dr Thigennaut (neurologist) for Rexulti 0.5 mg 1 tab daily at 8 am for psychosis, agitation and depression. Also Quetiapine  25 mg - 3 tabs in the evening. - I have signed the order and will give back to the daughter and make a copy for the chart.

## 2024-03-06 DIAGNOSIS — D259 Leiomyoma of uterus, unspecified: Secondary | ICD-10-CM | POA: Diagnosis not present

## 2024-03-06 DIAGNOSIS — M542 Cervicalgia: Secondary | ICD-10-CM | POA: Diagnosis not present

## 2024-03-06 DIAGNOSIS — S0990XA Unspecified injury of head, initial encounter: Secondary | ICD-10-CM | POA: Diagnosis not present

## 2024-03-06 DIAGNOSIS — S0003XA Contusion of scalp, initial encounter: Secondary | ICD-10-CM | POA: Diagnosis not present

## 2024-03-06 DIAGNOSIS — M79641 Pain in right hand: Secondary | ICD-10-CM | POA: Diagnosis not present

## 2024-03-06 DIAGNOSIS — M79601 Pain in right arm: Secondary | ICD-10-CM | POA: Diagnosis not present

## 2024-03-06 DIAGNOSIS — S199XXA Unspecified injury of neck, initial encounter: Secondary | ICD-10-CM | POA: Diagnosis not present

## 2024-03-06 DIAGNOSIS — R918 Other nonspecific abnormal finding of lung field: Secondary | ICD-10-CM | POA: Diagnosis not present

## 2024-03-06 DIAGNOSIS — S0083XA Contusion of other part of head, initial encounter: Secondary | ICD-10-CM | POA: Diagnosis not present

## 2024-03-06 DIAGNOSIS — R2689 Other abnormalities of gait and mobility: Secondary | ICD-10-CM | POA: Diagnosis not present

## 2024-03-06 DIAGNOSIS — W06XXXA Fall from bed, initial encounter: Secondary | ICD-10-CM | POA: Diagnosis not present

## 2024-03-06 DIAGNOSIS — M546 Pain in thoracic spine: Secondary | ICD-10-CM | POA: Diagnosis not present

## 2024-03-06 DIAGNOSIS — R10819 Abdominal tenderness, unspecified site: Secondary | ICD-10-CM | POA: Diagnosis not present

## 2024-03-06 DIAGNOSIS — S299XXA Unspecified injury of thorax, initial encounter: Secondary | ICD-10-CM | POA: Diagnosis not present

## 2024-03-06 DIAGNOSIS — M25511 Pain in right shoulder: Secondary | ICD-10-CM | POA: Diagnosis not present

## 2024-03-06 DIAGNOSIS — M79621 Pain in right upper arm: Secondary | ICD-10-CM | POA: Diagnosis not present

## 2024-03-07 ENCOUNTER — Telehealth: Payer: Self-pay

## 2024-03-07 ENCOUNTER — Telehealth: Payer: Self-pay | Admitting: Licensed Clinical Social Worker

## 2024-03-07 ENCOUNTER — Ambulatory Visit: Payer: Self-pay

## 2024-03-07 ENCOUNTER — Encounter: Payer: Self-pay | Admitting: Nurse Practitioner

## 2024-03-07 NOTE — Telephone Encounter (Signed)
 Copied from CRM [id number]. Topic: Clinical - Red Word Triage >> Mar 07, 2024  9:12 AM Wess RAMAN wrote: Red Word that prompted transfer to Nurse Triage: Adrien, caregiver, stated patient fell around 8:15am this morning. No visible injuries right now. No pain at this time. Reason for Disposition  MILD weakness (e.g., does not interfere with ability to work, go to school, normal activities)  (Exception: Mild weakness is a chronic symptom.)  Answer Assessment - Initial Assessment Questions 1. MECHANISM: How did the fall happen?     Rolled off of bed this morning  2. DOMESTIC VIOLENCE AND ELDER ABUSE SCREENING: Did you fall because someone pushed you or tried to hurt you? If Yes, ask: Are you safe now?     No  3. ONSET: When did the fall happen? (e.g., minutes, hours, or days ago)     This morning around 0815  4. LOCATION: What part of the body hit the ground? (e.g., back, buttocks, head, hips, knees, hands, head, stomach)     Landed on left side  5. INJURY: Did you hurt (injure) yourself when you fell? If Yes, ask: What did you injure? Tell me more about this? (e.g., body area; type of injury; pain severity)     No visible injuries  6. PAIN: Is there any pain? If Yes, ask: How bad is the pain? (e.g., Scale 0-10; or none, mild,      No pain at this time  7. SIZE: For cuts, bruises, or swelling, ask: How large is it? (e.g., inches or centimeters)      No  8. PREGNANCY: Is there any chance you are pregnant? When was your last menstrual period?     No  9. OTHER SYMPTOMS: Do you have any other symptoms? (e.g., dizziness, fever, weakness; new-onset or worsening).      No  10. CAUSE: What do you think caused the fall (or falling)? (e.g., dizzy spell, tripped)       Patient rolled off of bed  Patient is a SNF. Caregiver Adrien called to report that patient rolled off of bed this morning. Patient had similar issue yesterday and was seen in the ED, later discharged.   This RN advising PCP follow-up. Debra asking RN to schedule visit with patients daughter Olam. This RN placed call to daughter Olam, call answered, then RN was placed on hold. RN called Olam back, no answer, and unable to leave vmail. Will place in callback folder for follow-up calls.  Protocols used: Falls and [facility]

## 2024-03-07 NOTE — Telephone Encounter (Signed)
 FAX NUMBER TO [facility] - [phone]

## 2024-03-07 NOTE — Telephone Encounter (Signed)
 FYI Only or Action Required?: FYI only for provider.  Patient was last seen in primary care on 02/12/2024 by Georgina Speaks, FNP.  Called Nurse Triage reporting Fall.  Symptoms began today.  Interventions attempted: Nothing.  Symptoms are: stable.  Triage Disposition: Home Care  Patient/caregiver understands and will follow disposition?: Yes   Copied from CRM #[id number]. Topic: Clinical - Red Word Triage >> Mar 07, 2024  5:09 PM Tinnie BROCKS wrote: Red Word that prompted transfer to Nurse Triage: Faith from [facility] calling for one of her residents. Pt had a fall about 15 mins ago, no visible injuries at this time. Reason for Disposition  [1] Recent fall AND [2] no injury  Answer Assessment - Initial Assessment Questions Faith from [facility] (supervisor med tech) called to report the fall of a resident. Patient fell approximately 15 mins ago. Faith states the patient was walking in the hall and was offered help but declined. Pt was standing in the hall and walked to sit down. She assumes the patient attempted to sit down but missed the seat. Patient found flat on her bottom on the floor. Per Faith, patient denied pain. No visible injuries. Patient's BP was 160/76.  Protocols used: Falls and [facility]

## 2024-03-07 NOTE — Telephone Encounter (Addendum)
 Please call daughter Daughter Olam: [phone]   Patient's daughter returned missed call from initial triage RN. This RN recommended patient be evaluated in the ED d/t unwitnessed fall this morning, pt may have been on the floor for an hour or longer. States mother denied any pain/injuries and was found with her head on a pillow on the floor. Daughter refused ED recommendation. This RN provided rationale including age, length of time on floor, unwitnessed fall, unknown head strike (even if through pillow) and dementia limiting the facility's assessment and she stated that she will consider it.   Daughter will call back to schedule hospital follow-up.  Daughter has following questions/concerns for PCP:  -Facility policy does not allow for bed rails, family is going to discuss with their corporate. This RN provided daughter with joint commission website so she can review what is and isn't considered restraint in regards to rails.  -Facility is recommending a body pillow for the patient, states that they will need an order for it from provider  -Requesting to d/c compression stocking order  -Requesting dose for OTC melatonin, mother wakes and sometimes rolls out of bed between 11pm and 7am. Daughter would like to see if melatonin could help with that.

## 2024-03-07 NOTE — Telephone Encounter (Signed)
 Order faxed to [facility] [phone] for patient to use body pillow.

## 2024-03-08 ENCOUNTER — Other Ambulatory Visit: Payer: Self-pay | Admitting: Licensed Clinical Social Worker

## 2024-03-08 NOTE — Patient Instructions (Signed)
 Visit Information  Thank you for taking time to visit with me today. Please don't hesitate to contact me if I can be of assistance to you before our next scheduled appointment.  Your next care management appointment is by telephone on 10/03 at 3:30 PM  Please call the care guide team at [phone] if you need to cancel, schedule, or reschedule an appointment.   Please call the Suicide and Crisis Lifeline: 988 go to [facility]gent [facility] [address] [place] [phone]) call 911 if you are experiencing a Mental Health or Behavioral Health Crisis or need someone to talk to.  Rolin Kerns, LCSW [place]  [facility], [facility] Clinical Social Worker Direct Dial: [phone]  Fax: [phone] Website: [email or web address] 6:14 PM

## 2024-03-08 NOTE — Patient Outreach (Signed)
 Complex Care Management   Visit Note  03/08/2024  Name:  Denise [name] MRN: [id number] DOB: 20-Oct-1950  Situation: Referral received for Complex Care Management related to Mental/Behavioral Health diagnosis Anxiety and Depression I obtained verbal consent from Caregiver.  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    Assessment: Patient Reported Symptoms:  Cognitive Cognitive Status: No symptoms reported, Able to follow simple commands, Normal speech and language skills   Health Maintenance Behaviors: Annual physical exam  Neurological Neurological Review of Symptoms: No symptoms reported Neurological Management Strategies: Routine screening, Medication therapy  HEENT HEENT Symptoms Reported: Not assessed      Cardiovascular Cardiovascular Symptoms Reported: Not assessed    Respiratory Respiratory Symptoms Reported: Not assesed    Endocrine Endocrine Symptoms Reported: No symptoms reported Is patient diabetic?: No    Gastrointestinal Gastrointestinal Symptoms Reported: Not assessed      Genitourinary Genitourinary Symptoms Reported: Not assessed    Integumentary Integumentary Symptoms Reported: Not assessed    Musculoskeletal Musculoskelatal Symptoms Reviewed: Unsteady gait Additional Musculoskeletal Details: Parkinson's Musculoskeletal Management Strategies: Medication therapy, Routine screening, Adequate rest, Coping strategies Falls in the past year?: Yes Number of falls in past year: 2 or more Was there an injury with Fall?: No Fall Risk Category Calculator: 2 Patient Fall Risk Level: Moderate Fall Risk Patient at Risk for Falls Due to: History of fall(s) Fall risk Follow up: Falls prevention discussed  Psychosocial Psychosocial Symptoms  Reported: Other Other Psychosocial Conditions: Caregiver stress Behavioral Management Strategies: Adequate rest, Coping strategies, Support system, Medication therapy Major Change/Loss/Stressor/Fears (CP): Medical condition, self Techniques to Cope with Loss/Stress/Change: Medication      03/08/2024    PHQ2-9 Depression Screening   Little interest or pleasure in doing things    Feeling down, depressed, or hopeless    PHQ-2 - Total Score    Trouble falling or staying asleep, or sleeping too much    Feeling tired or having little energy    Poor appetite or overeating     Feeling bad about yourself - or that you are a failure or have let yourself or your family down    Trouble concentrating on things, such as reading the newspaper or watching television    Moving or speaking so slowly that other people could have noticed.  Or the opposite - being so fidgety or restless that you have been moving around a lot more than usual    Thoughts that you would be better off dead, or hurting yourself in some way    PHQ2-9 Total Score    If you checked off any problems, how difficult have these problems made it for you to do your work, take care of things at home, or get along with other people    Depression Interventions/Treatment      There were no vitals filed for this visit.  Medications Reviewed Today     Reviewed by Ezzard Rolin BIRCH, LCSW (Social Worker) on 03/08/24 at 1754  Med List Status: <None>   Medication Order Taking? Sig Documenting Provider Last Dose Status Informant  alendronate  (FOSAMAX ) 70 MG tablet [id number]  TAKE 1 TABLET (70 MG TOTAL) BY MOUTH EVERY 7 DAYS WITH  FULL GLASS WATER ON EMPTY STOMACH Georgina Speaks, FNP  Active   atorvastatin  (LIPITOR) 10 MG tablet [id number]  TAKE 1 TABLET BY MOUTH EVERY DAY AT BEDTIME MONDAY THROUGH FRIDAY Moore, Janece, FNP  Active   Calcium  Carb-Cholecalciferol (OYSTER SHELL CALCIUM /VITAMIN D ) 500-200 MG-UNIT PACK [id number]  Take by mouth. [provider]  Active   carbidopa -levodopa  (SINEMET  IR) 25-100 MG tablet [id number]  Take 1 tablet by mouth at 8am,12pm, 4pm and 8pm. Try to avoid protein. May take an extra during the day if needed. Georgina Speaks, FNP  Active   dorzolamide-timolol (COSOPT) 2-0.5 % ophthalmic solution [id number]  Place 1 drop into the left eye 2 (two) times daily. [provider]  Active   hydrochlorothiazide  (HYDRODIURIL ) 12.5 MG tablet [id number]  TAKE 1 TABLET BY MOUTH EVERY DAY AS NEEDED FOR LEG SWELLING AS DIRECTED Georgina Speaks, FNP  Active   QUEtiapine  (SEROQUEL ) 25 MG tablet [id number]  Take 1 tab in am and 2 tabs pm - Dr. Lori  Patient taking differently: Take 1 tab in am and 3tabs pm - Dr. Lori Georgina, Speaks, FNP  Active   Travoprost, BAK Free, (TRAVATAN) 0.004 % SOLN ophthalmic solution [id number]  1 drop at bedtime. [provider]  Active   Vitamin D , Ergocalciferol , (DRISDOL ) 1.25 MG (50000 UNIT) CAPS capsule [id number]  TAKE 1 CAPSULE (50,000 UNITS TOTAL) BY MOUTH EVERY 7 (SEVEN) DAYS Georgina Speaks, FNP  Active             Recommendation:   Continue Current Plan of Care  Follow Up Plan:   Telephone follow-up in 1 month  Rolin Kerns, LCSW [facility]  [facility], [facility] Clinical Social Worker Direct Dial: [phone]  Fax: [phone] Website: [email or web address] 6:13 PM

## 2024-03-12 ENCOUNTER — Other Ambulatory Visit: Payer: Self-pay | Admitting: Nurse Practitioner

## 2024-03-12 MED ORDER — VITAMIN B-12 1000 MCG PO TABS: 1000.0000 ug | ORAL_TABLET | Freq: Every day | ORAL | Status: AC

## 2024-03-12 MED ORDER — REXULTI 0.5 MG PO TABS: 1.0000 | ORAL_TABLET | Freq: Every morning | ORAL | Status: DC

## 2024-03-13 ENCOUNTER — Other Ambulatory Visit: Payer: Self-pay | Admitting: Nurse Practitioner

## 2024-03-13 DIAGNOSIS — F0280 Dementia in other diseases classified elsewhere without behavioral disturbance: Secondary | ICD-10-CM

## 2024-03-13 MED ORDER — QUETIAPINE FUMARATE 25 MG PO TABS: ORAL_TABLET | ORAL | Status: AC

## 2024-03-14 ENCOUNTER — Encounter: Payer: Self-pay | Admitting: Nurse Practitioner

## 2024-03-14 ENCOUNTER — Other Ambulatory Visit: Payer: Self-pay | Admitting: Nurse Practitioner

## 2024-03-14 MED ORDER — DESITIN 13 % EX CREA: 1.0000 | TOPICAL_CREAM | Freq: Three times a day (TID) | CUTANEOUS | Status: AC | PRN

## 2024-03-26 ENCOUNTER — Other Ambulatory Visit: Payer: Self-pay | Admitting: Nurse Practitioner

## 2024-03-26 DIAGNOSIS — R600 Localized edema: Secondary | ICD-10-CM

## 2024-03-27 DIAGNOSIS — R633 Feeding difficulties, unspecified: Secondary | ICD-10-CM | POA: Diagnosis not present

## 2024-03-27 DIAGNOSIS — R1311 Dysphagia, oral phase: Secondary | ICD-10-CM | POA: Diagnosis not present

## 2024-03-27 DIAGNOSIS — G20A2 Parkinson's disease without dyskinesia, with fluctuations: Secondary | ICD-10-CM | POA: Diagnosis not present

## 2024-03-31 ENCOUNTER — Encounter: Payer: Self-pay | Admitting: Nurse Practitioner

## 2024-04-01 ENCOUNTER — Other Ambulatory Visit: Payer: Self-pay | Admitting: Nurse Practitioner

## 2024-04-05 ENCOUNTER — Other Ambulatory Visit: Payer: Self-pay | Admitting: Licensed Clinical Social Worker

## 2024-04-08 DIAGNOSIS — F333 Major depressive disorder, recurrent, severe with psychotic symptoms: Secondary | ICD-10-CM | POA: Diagnosis not present

## 2024-04-09 NOTE — Patient Outreach (Signed)
 Complex Care Management   Visit Note  04/05/2024  Name:  Denise [name] MRN: [id number] DOB: July 13, 1950  Situation: Referral received for Complex Care Management related to Dementia and Caregiver Stress I obtained verbal consent from Caregiver.  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    Assessment: Patient Reported Symptoms:  Cognitive Cognitive Status: No symptoms reported, Able to follow simple commands, Struggling with memory recall Cognitive/Intellectual Conditions Management [RPT]: None reported or documented in medical history or problem list      Neurological Neurological Review of Symptoms: No symptoms reported Neurological Management Strategies: Routine screening, Medication therapy  HEENT HEENT Symptoms Reported: Not assessed      Cardiovascular Cardiovascular Symptoms Reported: Not assessed    Respiratory Respiratory Symptoms Reported: Not assesed    Endocrine Endocrine Symptoms Reported: Not assessed Is patient diabetic?: No    Gastrointestinal Gastrointestinal Symptoms Reported: Not assessed      Genitourinary Genitourinary Symptoms Reported: Not assessed    Integumentary Integumentary Symptoms Reported: Not assessed    Musculoskeletal Musculoskelatal Symptoms Reviewed: Unsteady gait Additional Musculoskeletal Details: Parkinson's Musculoskeletal Management Strategies: Medication therapy, Routine screening, Adequate rest, Coping strategies Falls in the past year?: Yes Number of falls in past year: 2 or more Was there an injury with Fall?: No Fall Risk Category Calculator: 2 Patient Fall Risk Level: Moderate Fall Risk Patient at Risk for Falls Due to: Impaired balance/gait  Psychosocial            04/09/2024    PHQ2-9  Depression Screening   Little interest or pleasure in doing things    Feeling down, depressed, or hopeless    PHQ-2 - Total Score    Trouble falling or staying asleep, or sleeping too much    Feeling tired or having little energy    Poor appetite or overeating     Feeling bad about yourself - or that you are a failure or have let yourself or your family down    Trouble concentrating on things, such as reading the newspaper or watching television    Moving or speaking so slowly that other people could have noticed.  Or the opposite - being so fidgety or restless that you have been moving around a lot more than usual    Thoughts that you would be better off dead, or hurting yourself in some way    PHQ2-9 Total Score    If you checked off any problems, how difficult have these problems made it for you to do your work, take care of things at home, or get along with other people    Depression Interventions/Treatment      There were no vitals filed for this visit.  Medications Reviewed Today     Reviewed by Ezzard Rolin BIRCH, LCSW (Social Worker) on 04/09/24 at 0608  Med List Status: <None>   Medication Order Taking? Sig Documenting Provider Last Dose Status Informant  alendronate  (FOSAMAX ) 70 MG tablet [id number]  TAKE 1 TABLET (70 MG TOTAL) BY MOUTH EVERY 7 DAYS WITH FULL GLASS WATER ON EMPTY STOMACH Georgina Speaks, FNP  Active   atorvastatin  (LIPITOR) 10 MG tablet [id number]  TAKE 1 TABLET BY MOUTH EVERY DAY AT  BEDTIME MONDAY THROUGH DAROLD Ada, [place], FNP  Active   Brexpiprazole  (REXULTI ) 0.5 MG TABS [id number]  Take 1 tablet (0.5 mg total) by mouth every morning. Ada Speaks, FNP  Active   Calcium  Carb-Cholecalciferol (OYSTER SHELL CALCIUM /VITAMIN D ) 500-200 MG-UNIT PACK [id number]  Take by mouth. [provider]  Active   carbidopa -levodopa  (SINEMET  IR) 25-100 MG tablet [id number]  Take 1 tablet by mouth at 8am,12pm, 4pm and 8pm. Try to avoid protein. May take an extra during the  day if needed. Ada Speaks, FNP  Active   cyanocobalamin  (VITAMIN B12) 1000 MCG tablet [id number]  Take 1 tablet (1,000 mcg total) by mouth daily. Ada Speaks, FNP  Active   dorzolamide-timolol (COSOPT) 2-0.5 % ophthalmic solution [id number]  Place 1 drop into the left eye 2 (two) times daily. [provider]  Active   hydrochlorothiazide  (HYDRODIURIL ) 12.5 MG tablet [id number]  TAKE 1 TABLET BY MOUTH EVERY DAY AS NEEDED FOR LEG SWELLING AS DIRECTED Ada Speaks, FNP  Active   QUEtiapine  (SEROQUEL ) 25 MG tablet [id number]  Take 3 tabs pm - Dr. Lori Ada, Speaks, FNP  Active   Travoprost, BAK Free, (TRAVATAN) 0.004 % SOLN ophthalmic solution [id number]  1 drop at bedtime. [provider]  Active   Vitamin D , Ergocalciferol , (DRISDOL ) 1.25 MG (50000 UNIT) CAPS capsule [id number]  TAKE 1 CAPSULE (50,000 UNITS TOTAL) BY MOUTH EVERY 7 (SEVEN) DAYS Ada Speaks, FNP  Active   Zinc Oxide (DESITIN) 13 % CREA [id number]  Apply 1 Application topically 3 (three) times daily as needed. Ada Speaks, FNP  Active             Recommendation:   Continue Current Plan of Care  Follow Up Plan:   Telephone follow-up in 1 month  Rolin Kerns, LCSW [facility]  [facility], [facility] Clinical Social Worker Direct Dial: [phone]  Fax: [phone] Website: [email or web address] 6:21 AM

## 2024-04-09 NOTE — Patient Instructions (Signed)
 Visit Information  Thank you for taking time to visit with me today. Please don't hesitate to contact me if I can be of assistance to you before our next scheduled appointment.  Your next care management appointment is by telephone on 11/7 at 3:30 PM  Please call the care guide team at [phone] if you need to cancel, schedule, or reschedule an appointment.   Please call the Suicide and Crisis Lifeline: 988 go to [facility] [address], [place] [phone]) call 911 if you are experiencing a Mental Health or Behavioral Health Crisis or need someone to talk to.  Rolin Kerns, LCSW [place]  [facility], [facility] Clinical Social Worker Direct Dial: [phone]  Fax: [phone] Website: [email or web address] 6:22 AM

## 2024-04-15 ENCOUNTER — Ambulatory Visit (INDEPENDENT_AMBULATORY_CARE_PROVIDER_SITE_OTHER): Admitting: Nurse Practitioner

## 2024-04-15 VITALS — BP 120/74 | HR 75 | Temp 98.6°F | Ht 66.0 in | Wt 119.2 lb

## 2024-04-15 DIAGNOSIS — N289 Disorder of kidney and ureter, unspecified: Secondary | ICD-10-CM | POA: Diagnosis not present

## 2024-04-15 DIAGNOSIS — F02C0 Dementia in other diseases classified elsewhere, severe, without behavioral disturbance, psychotic disturbance, mood disturbance, and anxiety: Secondary | ICD-10-CM

## 2024-04-15 DIAGNOSIS — E538 Deficiency of other specified B group vitamins: Secondary | ICD-10-CM

## 2024-04-15 DIAGNOSIS — M2550 Pain in unspecified joint: Secondary | ICD-10-CM

## 2024-04-15 DIAGNOSIS — R82998 Other abnormal findings in urine: Secondary | ICD-10-CM | POA: Diagnosis not present

## 2024-04-15 DIAGNOSIS — M545 Low back pain, unspecified: Secondary | ICD-10-CM | POA: Diagnosis not present

## 2024-04-15 DIAGNOSIS — R319 Hematuria, unspecified: Secondary | ICD-10-CM

## 2024-04-15 DIAGNOSIS — G20A1 Parkinson's disease without dyskinesia, without mention of fluctuations: Secondary | ICD-10-CM

## 2024-04-15 LAB — POCT URINALYSIS DIP (CLINITEK)
Bilirubin, UA: NEGATIVE
Glucose, UA: NEGATIVE mg/dL
Ketones, POC UA: NEGATIVE mg/dL
Nitrite, UA: NEGATIVE
POC PROTEIN,UA: NEGATIVE
Spec Grav, UA: 1.015 (ref 1.010–1.025)
Urobilinogen, UA: 1 U/dL
pH, UA: 6.5 (ref 5.0–8.0)

## 2024-04-15 MED ORDER — KETOROLAC TROMETHAMINE 60 MG/2ML IM SOLN
60.0000 mg | Freq: Once | INTRAMUSCULAR | Status: AC
  Administered 2024-04-15: 60 mg via INTRAMUSCULAR

## 2024-04-15 NOTE — Progress Notes (Signed)
 LILLETTE Kristeen JINNY Gladis, CMA,acting as a Neurosurgeon for [name], FNP.,have documented all relevant documentation on the behalf of [name], FNP,as directed by  [name], FNP while in the presence of [name], FNP.  Subjective:  Patient ID: Denise [name] , female    DOB: 1950-11-26 , 73 y.o.   MRN: [id number]  Chief Complaint  Patient presents with   Back Pain    Patient presents today for lower back and left hip pain that feels like burning. She reports this started a few weeks ago.    Shoulder Pain    Patient also reports pain in right shoulder that she previously fell on.    Discussed the use of AI scribe software for clinical note transcription with the patient, who gave verbal consent to proceed.  History of Present Illness Denise [name] is a 73 year old female who presents with lower back pain and right shoulder pain following a fall. She is accompanied by a family member.  She experiences lower back pain described as hurting and burning, which started a few weeks ago. She has been taking 650 mg of Tylenol  as needed, but the pain persists.  She also has right shoulder pain that began after a fall where she hit her head and shoulder. The pain is short, throbbing, and squeezing, and is exacerbated by movement. She has difficulty using her right arm and experiences pain when raising it or moving it across her chest.  Additionally, she mentions left foot pain, which she describes as feeling like it might 'fall off'. This is a new complaint that her family member was not previously aware of. She has a history of a fall where she went to the ER after hitting her head and shoulder. Her family member notes that she has a high tolerance for pain and does not frequently request pain medication.  She is currently taking Rexulti , with a possible increase to 2 mg, and melatonin for sleep, which has been increased to 12 mg without significant improvement. She is also taking a daily vitamin B12  supplement of 1000 micrograms.  She drinks water, tea, and Gatorade regularly and does not eat breakfast. She reports a little bit of blood in her urine and a history of white blood cells in her urine, but no UTI was diagnosed previously.      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     Family History  Problem Relation Age of Onset   Hypertension Mother    Heart disease Mother    Diabetes Father    Hypertension Father    Other Father        had a condition that was a cousin to ALS    Diabetes Sister    Anemia Sister    Tuberculosis Maternal Grandmother    Heart disease Sister    Colitis Daughter    Colon cancer Neg Hx    Colon polyps Neg Hx    Esophageal cancer Neg Hx    Rectal cancer Neg Hx    Stomach cancer Neg Hx      Current Outpatient Medications:    alendronate  (FOSAMAX ) 70 MG tablet, TAKE 1 TABLET (70 MG TOTAL) BY MOUTH EVERY 7 DAYS WITH FULL GLASS WATER ON  EMPTY STOMACH, Disp: 12 tablet, Rfl: 1   atorvastatin  (LIPITOR) 10 MG tablet, TAKE 1 TABLET BY MOUTH EVERY DAY AT BEDTIME MONDAY THROUGH FRIDAY, Disp: 65 tablet, Rfl: 4   Calcium  Carb-Cholecalciferol (OYSTER SHELL CALCIUM /VITAMIN D ) 500-200 MG-UNIT PACK, Take by mouth., Disp: , Rfl:    carbidopa -levodopa  (SINEMET  IR) 25-100 MG tablet, Take 1 tablet by mouth at 8am,12pm, 4pm and 8pm. Try to avoid protein. May take an extra during the day if needed., Disp: , Rfl:    cyanocobalamin  (VITAMIN B12) 1000 MCG tablet, Take 1 tablet (1,000 mcg total) by mouth daily., Disp: , Rfl:    dorzolamide-timolol (COSOPT) 2-0.5 % ophthalmic solution, Place 1 drop into the left eye 2 (two) times daily., Disp: , Rfl:    hydrochlorothiazide  (HYDRODIURIL ) 12.5 MG tablet, TAKE 1 TABLET BY MOUTH EVERY DAY AS NEEDED FOR LEG SWELLING AS DIRECTED, Disp: 90 tablet, Rfl: 1    QUEtiapine  (SEROQUEL ) 25 MG tablet, Take 3 tabs pm - Dr. Lori, Disp: , Rfl:    REXULTI  1 MG TABS tablet, Take 1 mg by mouth daily., Disp: , Rfl:    Travoprost, BAK Free, (TRAVATAN) 0.004 % SOLN ophthalmic solution, 1 drop at bedtime., Disp: , Rfl:    Vitamin D , Ergocalciferol , (DRISDOL ) 1.25 MG (50000 UNIT) CAPS capsule, TAKE 1 CAPSULE (50,000 UNITS TOTAL) BY MOUTH EVERY 7 (SEVEN) DAYS, Disp: 12 capsule, Rfl: 0   Zinc Oxide (DESITIN) 13 % CREA, Apply 1 Application topically 3 (three) times daily as needed., Disp: , Rfl:    Allergies  Allergen Reactions   Nystatin -Triamcinolone  Swelling    BLISTERS   Triamcinolone  Acetonide     Blisters    Cephalexin  Itching and Rash     Review of Systems  Constitutional: Negative.   Respiratory: Negative.    Cardiovascular: Negative.   Musculoskeletal:  Negative for gait problem.       Pain to left low back, left hip and left foot feels like it wanting to fall off. Right shoulder pain since her fall head first.   Neurological: Negative.   Psychiatric/Behavioral: Negative.       Today's Vitals   04/15/24 1626  BP: 120/74  Pulse: 75  Temp: 98.6 F (37 C)  TempSrc: Oral  Weight: 119 lb 3.2 oz (54.1 kg)  Height: 5' 6 (1.676 m)  PainSc: 10-Worst pain ever  PainLoc: Back   Body mass index is 19.24 kg/m.  Wt Readings from Last 3 Encounters:  04/15/24 119 lb 3.2 oz (54.1 kg)  02/12/24 118 lb 6.4 oz (53.7 kg)  01/02/24 117 lb 6.4 oz (53.3 kg)      Objective:  Physical Exam Vitals and nursing note reviewed.  Constitutional:      General: She is not in acute distress.    Appearance: Normal appearance. She is well-developed.     Comments: Thin appearance  HENT:     Head: Normocephalic and atraumatic.  Eyes:     Pupils: Pupils are equal, round, and reactive to light.  Cardiovascular:     Rate and Rhythm: Normal rate and regular rhythm.     Pulses: Normal pulses.     Heart sounds: Normal heart sounds. No murmur heard. Pulmonary:      Effort: Pulmonary effort is normal. No respiratory distress.     Breath sounds: No wheezing.  Musculoskeletal:        General: Normal range of motion.     Comments: Slow gait and using rolling walker  Skin:    General: Skin  is warm.     Findings: Bruising (left leg) present.  Neurological:     General: No focal deficit present.     Mental Status: She is alert and oriented to person, place, and time.     Cranial Nerves: No cranial nerve deficit.     Motor: No weakness.  Psychiatric:        Mood and Affect: Mood normal.         Assessment And Plan:  Acute bilateral low back pain without sciatica Assessment & Plan: Chronic pain post-fall, severe and persistent despite Tylenol . Right shoulder pain likely due to decreased use and possible arthritis. - Administer pain relief injection. - Consider meloxicam for daily management. - Consider orthopedic referral for shoulder evaluation if pain persists. - Evaluate need for physical therapy for shoulder mobility.  Orders: -     POCT URINALYSIS DIP (CLINITEK)  Leukocytes in urine Assessment & Plan: Blood and white cells in urine, findings are not unexpected based on prior results. - Send urine sample for culture.  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 - Recheck Vitamin B12 levels.  Orders: -     Vitamin B12  Abnormal kidney function -     BMP8+eGFR    Return if symptoms worsen or fail to improve.  Patient was given opportunity to ask questions. Patient verbalized understanding of the plan and was able to repeat key elements of the plan. All questions were answered to their  satisfaction.    [name], FNP, have reviewed all documentation for this visit. The documentation on 04/15/24 for the exam, diagnosis, procedures, and orders are all accurate and complete.   IF YOU HAVE BEEN REFERRED TO A SPECIALIST, IT MAY TAKE 1-2 WEEKS TO SCHEDULE/PROCESS THE REFERRAL. IF YOU HAVE NOT HEARD FROM US /SPECIALIST IN TWO WEEKS, PLEASE GIVE US  A CALL AT [phone].

## 2024-04-16 LAB — BMP8+EGFR
BUN/Creatinine Ratio: 12 (ref 12–28)
BUN: 12 mg/dL (ref 8–27)
CO2: 23 mmol/L (ref 20–29)
Calcium: 9 mg/dL (ref 8.7–10.3)
Chloride: 106 mmol/L (ref 96–106)
Creatinine, Ser: 0.99 mg/dL (ref 0.57–1.00)
Glucose: 90 mg/dL (ref 70–99)
Potassium: 4.1 mmol/L (ref 3.5–5.2)
Sodium: 143 mmol/L (ref 134–144)
eGFR: 61 mL/min/1.73 (ref >59)

## 2024-04-16 LAB — VITAMIN B12: Vitamin B-12: 1464 pg/mL — ABNORMAL HIGH (ref 232–1245)

## 2024-04-19 LAB — URINE CULTURE

## 2024-04-21 ENCOUNTER — Ambulatory Visit: Payer: Self-pay | Admitting: Nurse Practitioner

## 2024-04-21 DIAGNOSIS — M545 Low back pain, unspecified: Secondary | ICD-10-CM

## 2024-04-21 HISTORY — DX: Low back pain, unspecified: M54.50

## 2024-04-21 NOTE — Assessment & Plan Note (Signed)
 Chronic pain post-fall, severe and persistent despite Tylenol . Right shoulder pain likely due to decreased use and possible arthritis. - Administer pain relief injection. - Consider meloxicam for daily management. - Consider orthopedic referral for shoulder evaluation if pain persists. - Evaluate need for physical therapy for shoulder mobility.

## 2024-04-21 NOTE — Assessment & Plan Note (Signed)
 Blood and white cells in urine, findings are not unexpected based on prior results. - Send urine sample for culture.

## 2024-04-21 NOTE — Assessment & Plan Note (Signed)
 Previously low B12 levels, on 1000 mcg daily supplement. - Recheck Vitamin B12 levels.

## 2024-04-23 ENCOUNTER — Telehealth: Payer: Self-pay

## 2024-04-23 NOTE — Telephone Encounter (Signed)
 Copied from CRM #[id number]. Topic: Clinical - Medication Question >> Apr 23, 2024  9:07 AM Darshell M wrote: Reason for CRM: Debra with [facility] calling to report that patient had a fall in her bedroom. No visible injuries. CB# [phone].

## 2024-05-01 ENCOUNTER — Ambulatory Visit: Admitting: Nurse Practitioner

## 2024-05-02 DIAGNOSIS — M79675 Pain in left toe(s): Secondary | ICD-10-CM | POA: Diagnosis not present

## 2024-05-02 DIAGNOSIS — M79674 Pain in right toe(s): Secondary | ICD-10-CM | POA: Diagnosis not present

## 2024-05-02 DIAGNOSIS — B351 Tinea unguium: Secondary | ICD-10-CM | POA: Diagnosis not present

## 2024-05-02 DIAGNOSIS — L84 Corns and callosities: Secondary | ICD-10-CM | POA: Diagnosis not present

## 2024-05-02 DIAGNOSIS — I70203 Unspecified atherosclerosis of native arteries of extremities, bilateral legs: Secondary | ICD-10-CM | POA: Diagnosis not present

## 2024-05-06 DIAGNOSIS — F333 Major depressive disorder, recurrent, severe with psychotic symptoms: Secondary | ICD-10-CM | POA: Diagnosis not present

## 2024-05-10 ENCOUNTER — Ambulatory Visit: Payer: Self-pay

## 2024-05-10 ENCOUNTER — Telehealth: Payer: Self-pay

## 2024-05-10 ENCOUNTER — Other Ambulatory Visit: Payer: Self-pay | Admitting: Licensed Clinical Social Worker

## 2024-05-10 NOTE — Telephone Encounter (Signed)
 Copied from CRM #[id number]. Topic: General - Other >> May 10, 2024  9:09 AM Everette C wrote: Reason for CRM: Denise [name] with [name] would also like to discuss the previous submission of fall reports on 10/22 10/25 and 10/31 respectively. Please contact further when possible.   Called and spoke with Denise [name]- Let her know that all reports has been scanned to the email provided. Denise [name] stated she was unable to get into email provided. Will fax forms again today.

## 2024-05-10 NOTE — Telephone Encounter (Signed)
 FYI Only or Action Required?: FYI only for provider:  .  Patient was last seen in primary care on 04/15/2024 by Georgina Speaks, FNP.  Called Nurse Triage reporting Weight Loss.  Symptoms began about a month ago.  Interventions attempted: Nothing.  Symptoms are: unchanged.  Triage Disposition: Call PCP When Office is Open  Patient/caregiver understands and will follow disposition?: Yes   Message from [facility] C sent at 05/10/2024  9:09 AM EST  Summary: weight loss FYI   Reason for Triage: Denise [name] with Denise [name] has called to share that the patient's weight has decreased from 125.5 to 117 lbs within one month. Denise [name] shares that there are no concerns but the weight loss must be reported to the patient's PCP         Reason for Disposition  [1] Caller requesting NON-URGENT health information AND [2] PCP's office is the best resource  Answer Assessment - Initial Assessment Questions 1. REASON FOR CALL or QUESTION: What is your reason for calling today? or How can I best     To report pt's weight loss of 125.5 lbs to 117 lbs in the last month. Denies any other symptoms at this time.  2. CALLER: Document the source of call. (e.g., laboratory staff, caregiver or patient).     Denise [name] calling from [employer]  Protocols used: Information Only Call - No Triage-A-AH

## 2024-05-10 NOTE — Patient Instructions (Signed)
 Visit Information  Thank you for taking time to visit with me today. Please don't hesitate to contact me if I can be of assistance to you before our next scheduled appointment.  Your next care management appointment is by telephone on 12/12 at 3:30 PM  Please call the care guide team at [phone] if you need to cancel, schedule, or reschedule an appointment.   Please call the Suicide and Crisis Lifeline: 988 go to [facility] Urgent [facility] [address] [place] [phone]) call 911 if you are experiencing a Mental Health or Behavioral Health Crisis or need someone to talk to.  Rolin Kerns, LCSW [place]  [facility], [facility] Clinical Social Worker Direct Dial: [phone]  Fax: [phone] Website: [email or web address] 4:35 PM

## 2024-05-10 NOTE — Patient Outreach (Signed)
 Complex Care Management   Visit Note  05/10/2024  Name:  Denise [name] MRN: [id number] DOB: 1951-01-15  Situation: Referral received for Complex Care Management related to Mental/Behavioral Health diagnosis Anxiety and Depression I obtained verbal consent from Caregiver.  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    Assessment: Patient Reported Symptoms:  Cognitive Cognitive Status: No symptoms reported, Able to follow simple commands, Struggling with memory recall Cognitive/Intellectual Conditions Management [RPT]: None reported or documented in medical history or problem list   Health Maintenance Behaviors: Annual physical exam  Neurological Neurological Review of Symptoms: No symptoms reported Neurological Management Strategies: Medication therapy, Routine screening  HEENT HEENT Symptoms Reported: No symptoms reported      Cardiovascular Cardiovascular Symptoms Reported: No symptoms reported    Respiratory Respiratory Symptoms Reported: No symptoms reported    Endocrine Endocrine Symptoms Reported: No symptoms reported    Gastrointestinal Gastrointestinal Symptoms Reported: Change in appetite Gastrointestinal Comment: Facility has informed PCP of weight loss (approx five lbs) Caregiver reports pt decreased snacking in between meals    Genitourinary Genitourinary Symptoms Reported: Incontinence Genitourinary Management Strategies: Incontinence garment/pad  Integumentary Integumentary Symptoms Reported: No symptoms reported    Musculoskeletal Musculoskelatal Symptoms Reviewed: Unsteady gait, Muscle pain Additional Musculoskeletal Details: Parkinson's Musculoskeletal Management Strategies: Medication therapy, Routine screening, Coping strategies,  Adequate rest Musculoskeletal Comment: Pt was administered injection to assist with pain relief at PCP office   Patient at Risk for Falls Due to: Impaired balance/gait  Psychosocial Psychosocial Symptoms Reported: No symptoms reported   Major Change/Loss/Stressor/Fears (CP): Medical condition, self Techniques to Cope with Loss/Stress/Change: Diversional activities, Medication      05/10/2024    PHQ2-9 Depression Screening   Little interest or pleasure in doing things    Feeling down, depressed, or hopeless    PHQ-2 - Total Score    Trouble falling or staying asleep, or sleeping too much    Feeling tired or having little energy    Poor appetite or overeating     Feeling bad about yourself - or that you are a failure or have let yourself or your family down    Trouble concentrating on things, such as reading the newspaper or watching television    Moving or speaking so slowly that other people could have noticed.  Or the opposite - being so fidgety or restless that you have been moving around a lot more than usual    Thoughts that you would be better off dead, or hurting yourself in some way    PHQ2-9 Total Score    If you checked off any problems, how difficult have these problems made it for you to do your work, take care of things at home, or get along with other people    Depression Interventions/Treatment      There were no vitals filed for this visit.  Medications Reviewed Today     Reviewed by Ezzard Rolin BIRCH, LCSW (Social Worker) on 05/10/24 at 1627  Med List Status: <None>   Medication Order Taking? Sig Documenting Provider Last Dose Status Informant  alendronate  (FOSAMAX ) 70 MG tablet [id number]  TAKE 1 TABLET (70 MG TOTAL) BY MOUTH EVERY 7 DAYS WITH  FULL GLASS WATER ON EMPTY STOMACH Georgina Speaks, FNP  Active   atorvastatin  (LIPITOR) 10 MG tablet [id number]  TAKE 1 TABLET BY MOUTH EVERY DAY AT BEDTIME MONDAY THROUGH FRIDAY Moore, Janece, FNP  Active   Calcium   Carb-Cholecalciferol (OYSTER SHELL CALCIUM /VITAMIN D ) 500-200 MG-UNIT PACK [id number]  Take by mouth. [provider]  Active   carbidopa -levodopa  (SINEMET  IR) 25-100 MG tablet [id number]  Take 1 tablet by mouth at 8am,12pm, 4pm and 8pm. Try to avoid protein. May take an extra during the day if needed. Georgina Speaks, FNP  Active   cyanocobalamin  (VITAMIN B12) 1000 MCG tablet [id number]  Take 1 tablet (1,000 mcg total) by mouth daily. Georgina Speaks, FNP  Active   dorzolamide-timolol (COSOPT) 2-0.5 % ophthalmic solution [id number]  Place 1 drop into the left eye 2 (two) times daily. [provider]  Active   hydrochlorothiazide  (HYDRODIURIL ) 12.5 MG tablet [id number]  TAKE 1 TABLET BY MOUTH EVERY DAY AS NEEDED FOR LEG SWELLING AS DIRECTED Georgina Speaks, FNP  Active   QUEtiapine  (SEROQUEL ) 25 MG tablet [id number]  Take 3 tabs pm - Dr. Lori Georgina, Speaks, FNP  Active   REXULTI  1 MG TABS tablet [id number]  Take 1 mg by mouth daily. [provider]  Active   Travoprost, BAK Free, (TRAVATAN) 0.004 % SOLN ophthalmic solution [id number]  1 drop at bedtime. [provider]  Active   Vitamin D , Ergocalciferol , (DRISDOL ) 1.25 MG (50000 UNIT) CAPS capsule [id number]  TAKE 1 CAPSULE (50,000 UNITS TOTAL) BY MOUTH EVERY 7 (SEVEN) DAYS Georgina Speaks, FNP  Active   Zinc Oxide (DESITIN) 13 % CREA [id number]  Apply 1 Application topically 3 (three) times daily as needed. Georgina Speaks, FNP  Active             Recommendation:   Continue Current Plan of Care  Follow Up Plan:   Telephone follow-up in 1 month  Rolin Kerns, LCSW [facility]  [facility], [facility] Clinical Social Worker Direct Dial: [phone]  Fax: [phone] Website: [email or web address] 4:34 PM

## 2024-05-14 ENCOUNTER — Encounter: Payer: Self-pay | Admitting: Nurse Practitioner

## 2024-05-14 ENCOUNTER — Ambulatory Visit: Admitting: Nurse Practitioner

## 2024-05-16 DIAGNOSIS — R8290 Unspecified abnormal findings in urine: Secondary | ICD-10-CM | POA: Diagnosis not present

## 2024-05-16 DIAGNOSIS — G309 Alzheimer's disease, unspecified: Secondary | ICD-10-CM | POA: Diagnosis not present

## 2024-05-16 DIAGNOSIS — L89159 Pressure ulcer of sacral region, unspecified stage: Secondary | ICD-10-CM | POA: Diagnosis not present

## 2024-05-16 DIAGNOSIS — R4189 Other symptoms and signs involving cognitive functions and awareness: Secondary | ICD-10-CM | POA: Diagnosis not present

## 2024-05-16 DIAGNOSIS — R399 Unspecified symptoms and signs involving the genitourinary system: Secondary | ICD-10-CM | POA: Diagnosis not present

## 2024-05-21 ENCOUNTER — Ambulatory Visit: Admitting: Nurse Practitioner

## 2024-05-21 NOTE — Progress Notes (Deleted)
 LILLETTE Kristeen JINNY Gladis, CMA,acting as a neurosurgeon for Denise Ada, FNP.,have documented all relevant documentation on the behalf of Denise Ada, FNP,as directed by  Denise Ada, FNP while in the presence of Denise Ada, FNP.  Subjective:  Patient ID: Denise [name] , female    DOB: [birth year] , 73 y.o.   MRN: [id number]  No chief complaint on file.   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     Family History  Problem Relation Age of Onset   Hypertension Mother    Heart disease Mother    Diabetes Father    Hypertension Father    Other Father        had a condition that was a cousin to ALS    Diabetes Sister    Anemia Sister    Tuberculosis Maternal Grandmother    Heart disease Sister    Colitis Daughter    Colon cancer Neg Hx    Colon polyps Neg Hx    Esophageal cancer Neg Hx    Rectal cancer Neg Hx    Stomach cancer Neg Hx      Current Outpatient Medications:    alendronate  (FOSAMAX ) 70 MG tablet, TAKE 1 TABLET (70 MG TOTAL) BY MOUTH EVERY 7 DAYS WITH FULL GLASS WATER ON EMPTY STOMACH, Disp: 12 tablet, Rfl: 1   atorvastatin  (LIPITOR) 10 MG tablet, TAKE 1 TABLET BY MOUTH EVERY DAY AT BEDTIME MONDAY THROUGH FRIDAY, Disp: 65 tablet, Rfl: 4   Calcium  Carb-Cholecalciferol (OYSTER SHELL CALCIUM /VITAMIN D ) 500-200 MG-UNIT PACK, Take by mouth., Disp: , Rfl:    carbidopa -levodopa  (SINEMET  IR) 25-100 MG tablet, Take 1 tablet by mouth at 8am,12pm, 4pm and 8pm. Try to avoid protein. May take an extra during the day if needed., Disp: , Rfl:    cyanocobalamin  (VITAMIN B12) 1000 MCG tablet, Take 1 tablet (1,000 mcg total) by mouth daily., Disp: , Rfl:    dorzolamide-timolol (COSOPT) 2-0.5 % ophthalmic solution, Place 1 drop into the left eye 2 (two) times daily., Disp: , Rfl:    hydrochlorothiazide  (HYDRODIURIL ) 12.5  MG tablet, TAKE 1 TABLET BY MOUTH EVERY DAY AS NEEDED FOR LEG SWELLING AS DIRECTED, Disp: 90 tablet, Rfl: 1   QUEtiapine  (SEROQUEL ) 25 MG tablet, Take 3 tabs pm - Dr. Lori, Disp: , Rfl:    REXULTI  1 MG TABS tablet, Take 1 mg by mouth daily., Disp: , Rfl:    Travoprost, BAK Free, (TRAVATAN) 0.004 % SOLN ophthalmic solution, 1 drop at bedtime., Disp: , Rfl:    Vitamin D , Ergocalciferol , (DRISDOL ) 1.25 MG (50000 UNIT) CAPS capsule, TAKE 1 CAPSULE (50,000 UNITS TOTAL) BY MOUTH EVERY 7 (SEVEN) DAYS, Disp: 12 capsule, Rfl: 0   Zinc Oxide (DESITIN) 13 % CREA, Apply 1 Application topically 3 (three) times daily as needed., Disp: , Rfl:    Allergies  Allergen Reactions   Nystatin -Triamcinolone  Swelling    BLISTERS   Triamcinolone  Acetonide     Blisters    Cephalexin  Itching and Rash     Review of Systems   There were no vitals filed for this visit. There is no height or weight on file to calculate BMI.  Wt Readings from Last 3 Encounters:  04/15/24 119 lb 3.2 oz (54.1 kg)  02/12/24 118 lb 6.4 oz (53.7 kg)  01/02/24 117 lb 6.4 oz (53.3 kg)    The 10-year ASCVD risk score (Arnett DK, et al., 2019) is: 6%   Values used to calculate the score:     Age: 58 years     Clincally relevant sex: Female     Is Non-Hispanic African American: Yes     Diabetic: No     Tobacco smoker: No     Systolic Blood Pressure: 94 mmHg     Is BP treated: Yes     HDL Cholesterol: 62 mg/dL     Total Cholesterol: 158 mg/dL  Objective:  Physical Exam      Assessment And Plan:   Assessment & Plan   No orders of the defined types were placed in this encounter.    No follow-ups on file.  Patient was given opportunity to ask questions. Patient verbalized understanding of the plan and was able to repeat key elements of the plan. All questions were answered to their satisfaction.    LILLETTE Denise Ada, FNP, have reviewed all documentation for this visit. The documentation on 05/21/24 for the exam,  diagnosis, procedures, and orders are all accurate and complete.   IF YOU HAVE BEEN REFERRED TO A SPECIALIST, IT MAY TAKE 1-2 WEEKS TO SCHEDULE/PROCESS THE REFERRAL. IF YOU HAVE NOT HEARD FROM US /SPECIALIST IN TWO WEEKS, PLEASE GIVE US  A CALL AT [phone] X [phone].

## 2024-05-24 DIAGNOSIS — R8290 Unspecified abnormal findings in urine: Secondary | ICD-10-CM | POA: Diagnosis not present

## 2024-06-04 ENCOUNTER — Encounter: Payer: Self-pay | Admitting: Nurse Practitioner

## 2024-06-05 ENCOUNTER — Other Ambulatory Visit: Payer: Self-pay | Admitting: Nurse Practitioner

## 2024-06-05 DIAGNOSIS — H04123 Dry eye syndrome of bilateral lacrimal glands: Secondary | ICD-10-CM | POA: Diagnosis not present

## 2024-06-05 DIAGNOSIS — H2513 Age-related nuclear cataract, bilateral: Secondary | ICD-10-CM | POA: Diagnosis not present

## 2024-06-05 DIAGNOSIS — M810 Age-related osteoporosis without current pathological fracture: Secondary | ICD-10-CM

## 2024-06-05 DIAGNOSIS — H401112 Primary open-angle glaucoma, right eye, moderate stage: Secondary | ICD-10-CM | POA: Diagnosis not present

## 2024-06-05 DIAGNOSIS — H401123 Primary open-angle glaucoma, left eye, severe stage: Secondary | ICD-10-CM | POA: Diagnosis not present

## 2024-06-14 ENCOUNTER — Other Ambulatory Visit: Payer: Self-pay | Admitting: Licensed Clinical Social Worker

## 2024-06-19 NOTE — Patient Instructions (Signed)
 Visit Information  Thank you for taking time to visit with me today. Please don't hesitate to contact me if I can be of assistance to you before our next scheduled appointment.  Your next care management appointment is by telephone on 1/16 at 3:30 PM   Please call the care guide team at [phone] if you need to cancel, schedule, or reschedule an appointment.   Please call the Suicide and Crisis Lifeline: 988 call the [facility]: [phone] call 911 if you are experiencing a Mental Health or Behavioral Health Crisis or need someone to talk to.  Rolin Kerns, LCSW [place]  [facility], [facility] Clinical Social Worker Direct Dial: [phone]  Fax: [phone] Website: [email or web address] 4:52 PM

## 2024-06-19 NOTE — Patient Outreach (Signed)
 Complex Care Management   Visit Note  06/14/2024  Name:  Denise [name] MRN: [id number] DOB: 09/16/1950  Situation: Referral received for Complex Care Management related to Dementia I obtained verbal consent from Caregiver.  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    Assessment: Patient Reported Symptoms:  Cognitive Cognitive Status: No symptoms reported, Requires Assistance Decision Making Cognitive/Intellectual Conditions Management [RPT]: None reported or documented in medical history or problem list   Health Maintenance Behaviors: Annual physical exam  Neurological Neurological Review of Symptoms: Not assessed    HEENT HEENT Symptoms Reported: Not assessed      Cardiovascular Cardiovascular Symptoms Reported: Not assessed    Respiratory Respiratory Symptoms Reported: Not assesed    Endocrine Endocrine Symptoms Reported: No symptoms reported Is patient diabetic?: No    Gastrointestinal Gastrointestinal Symptoms Reported: No symptoms reported Gastrointestinal Comment: Patient is eating well    Genitourinary Genitourinary Symptoms Reported: Incontinence Genitourinary Management Strategies: Coping strategies  Integumentary Integumentary Symptoms Reported: Wound Additional Integumentary Details: Pt has a sore on bottom Skin Management Strategies: Coping strategies, Dressing changes, Routine screening Skin Comment: Caregiver continues to put ointment on site  Musculoskeletal Musculoskelatal Symptoms Reviewed: Other, Unsteady gait, Muscle pain Other Musculoskeletal Symptoms: Parkinson's Musculoskeletal Management Strategies: Adequate rest, Coping strategies, Medication therapy, Routine screening   Patient at Risk for Falls Due to: Impaired  balance/gait, History of fall(s)  Psychosocial Psychosocial Symptoms Reported: Alteration in sleep habits Additional Psychological Details: Pt's medication to assist with sleep was increased. Strategies to assist with sleep hygiene discussed. Some barriers may resolve itself, once pt returns to private residence Behavioral Management Strategies: Adequate rest, Support system, Coping strategies, Medication therapy Major Change/Loss/Stressor/Fears (CP): Medical condition, self Techniques to Cope with Loss/Stress/Change: Diversional activities, Medication Quality of Family Relationships: helpful, involved, supportive    06/19/2024    PHQ2-9 Depression Screening   Little interest or pleasure in doing things    Feeling down, depressed, or hopeless    PHQ-2 - Total Score    Trouble falling or staying asleep, or sleeping too much    Feeling tired or having little energy    Poor appetite or overeating     Feeling bad about yourself - or that you are a failure or have let yourself or your family down    Trouble concentrating on things, such as reading the newspaper or watching television    Moving or speaking so slowly that other people could have noticed.  Or the opposite - being so fidgety or restless that you have been moving around a lot more than usual    Thoughts that you would be better off dead, or hurting yourself in some way    PHQ2-9 Total Score    If you checked off any problems, how difficult have these problems made it for you to do your work, take care of things at home, or get along with other people    Depression Interventions/Treatment      There were no vitals filed for this visit.    Medications Reviewed Today     Reviewed by Ezzard Rolin BIRCH, LCSW (Social Worker) on 06/17/24 at 1341  Med List Status: <None>  Medication Order Taking? Sig Documenting Provider Last Dose Status Informant  alendronate  (FOSAMAX ) 70 MG tablet [id number]  TAKE 1 TABLET (70 MG TOTAL) BY MOUTH  EVERY 7 DAYS WITH FULL GLASS WATER ON EMPTY STOMACH Georgina Speaks, FNP  Active   atorvastatin  (LIPITOR) 10 MG tablet [id number]  TAKE 1 TABLET BY MOUTH EVERY DAY AT BEDTIME MONDAY THROUGH FRIDAY Moore, Janece, FNP  Active   Calcium  Carb-Cholecalciferol (OYSTER SHELL CALCIUM /VITAMIN D ) 500-200 MG-UNIT PACK [id number]  Take by mouth. [provider]  Active   carbidopa -levodopa  (SINEMET  IR) 25-100 MG tablet [id number]  Take 1 tablet by mouth at 8am,12pm, 4pm and 8pm. Try to avoid protein. May take an extra during the day if needed. Georgina Speaks, FNP  Active   cyanocobalamin  (VITAMIN B12) 1000 MCG tablet [id number]  Take 1 tablet (1,000 mcg total) by mouth daily. Georgina Speaks, FNP  Active   dorzolamide-timolol (COSOPT) 2-0.5 % ophthalmic solution [id number]  Place 1 drop into the left eye 2 (two) times daily. [provider]  Active   hydrochlorothiazide  (HYDRODIURIL ) 12.5 MG tablet [id number]  TAKE 1 TABLET BY MOUTH EVERY DAY AS NEEDED FOR LEG SWELLING AS DIRECTED Georgina Speaks, FNP  Active   QUEtiapine  (SEROQUEL ) 25 MG tablet [id number]  Take 3 tabs pm - Dr. Lori Georgina, Speaks, FNP  Active   REXULTI  1 MG TABS tablet [id number]  Take 1 mg by mouth daily. [provider]  Active   Travoprost, BAK Free, (TRAVATAN) 0.004 % SOLN ophthalmic solution [id number]  1 drop at bedtime. [provider]  Active   Vitamin D , Ergocalciferol , (DRISDOL ) 1.25 MG (50000 UNIT) CAPS capsule [id number]  TAKE 1 CAPSULE (50,000 UNITS TOTAL) BY MOUTH EVERY 7 (SEVEN) DAYS Georgina Speaks, FNP  Active   Zinc Oxide (DESITIN) 13 % CREA [id number]  Apply 1 Application topically 3 (three) times daily as needed. Georgina Speaks, FNP  Active             Recommendation:   Continue Current Plan of Care  Follow Up Plan:   Telephone follow-up in 1 month  Rolin Kerns, LCSW [facility]  [facility], [facility] Clinical Social Worker Direct Dial: [phone]  Fax:  [phone] Website: [email or web address] 4:51 PM

## 2024-07-01 ENCOUNTER — Encounter: Payer: Self-pay | Admitting: Nurse Practitioner

## 2024-07-09 ENCOUNTER — Encounter: Payer: Self-pay | Admitting: Nurse Practitioner

## 2024-07-09 ENCOUNTER — Ambulatory Visit (INDEPENDENT_AMBULATORY_CARE_PROVIDER_SITE_OTHER): Payer: Self-pay | Admitting: Nurse Practitioner

## 2024-07-09 VITALS — BP 120/60 | HR 57 | Temp 98.1°F | Ht 66.0 in | Wt 122.0 lb

## 2024-07-09 DIAGNOSIS — S31000D Unspecified open wound of lower back and pelvis without penetration into retroperitoneum, subsequent encounter: Secondary | ICD-10-CM

## 2024-07-09 DIAGNOSIS — R82998 Other abnormal findings in urine: Secondary | ICD-10-CM | POA: Diagnosis not present

## 2024-07-09 DIAGNOSIS — G20A1 Parkinson's disease without dyskinesia, without mention of fluctuations: Secondary | ICD-10-CM | POA: Diagnosis not present

## 2024-07-09 LAB — POCT URINALYSIS DIP (CLINITEK)
Bilirubin, UA: NEGATIVE
Blood, UA: NEGATIVE
Glucose, UA: NEGATIVE mg/dL
Nitrite, UA: NEGATIVE
POC PROTEIN,UA: NEGATIVE
Spec Grav, UA: 1.02
Urobilinogen, UA: 1 U/dL
pH, UA: 6

## 2024-07-09 NOTE — Progress Notes (Signed)
 I,Denise [name], CMA,acting as a neurosurgeon for [employer], FNP.,have documented all relevant documentation on the behalf of Denise Ada, FNP,as directed by  Denise Ada, FNP while in the presence of Denise Ada, FNP.  Subjective:  Patient ID: Denise [name] , female    DOB: [birth year] , 74 y.o.   MRN: [id number]  Chief Complaint  Patient presents with   Referral    Patient presents today for a referral to wound care. Patient reports she has a wound on her bottom and she needs help taking care of it.     She was seen at the urgent care a few weeks. She has been using metahoney and has changed every 12 hours. She was sitting in the recliner a lot more. She has been getting up more frequently. She has been back home since December 17th. Her daughter was having to go to the facility early morning hours. Her daughter feels she is doing better since being home has a bed in the living room while she sleeps on the couch. She will take her out sometimes unless is too cold. She is having trouble standing. There is a parkinson's pump to help with off times.     Discussed the use of AI scribe software for clinical note transcription with the patient, who gave verbal consent to proceed.  History of Present Illness [name] is a 74 year old female with Parkinson's disease who presents with a wound on her buttocks. She is accompanied by her daughter, who is her primary caregiver.  The wound on her buttocks was initially small but has worsened over the past two and a half weeks. It was first noticed approximately a week and a half before visiting urgent care. The wound is open, with old skin peeling off and occasional bleeding. Initially black and purple, the skin has started to grow back. There is a noticeable smell from the wound.  The wound care regimen includes cleaning with mild soap and water, and applying medicated honey and Tegaderm. Her daughter changes the dressing at least twice a day,  aiming for every 12 hours. She has also used non-adhesive pads. Initially, Desitin and dusting powder were used, but these were discontinued as the condition worsened.  Her daughter has been managing her care at home since June 19, 2024, after finding it exhausting to travel back and forth to a care facility. A bed has been set up in the living room to monitor her more easily, and efforts are made to ensure she is not sitting for prolonged periods to relieve pressure on the wound. She also has difficulty standing, especially in the morning, which may be related to her Parkinson's disease.  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   Cardiovascular: Negative.   Skin:  Positive for wound (buttocks - 2.5 weeks - had been putting desitin on the area and then opened. but has now improving. One side began bleeding.).  Psychiatric/Behavioral: Negative.       Today's Vitals   07/09/24 1536  BP: 120/60  Pulse: (!) 57  Temp: 98.1 F (36.7 C)  TempSrc: Oral  Weight: 122 lb (55.3 kg)  Height: 5' 6 (1.676 m)  PainSc: 0-No pain   Body mass index is 19.69  kg/m.  Wt Readings from Last 3 Encounters:  07/09/24 122 lb (55.3 kg)  04/15/24 119 lb 3.2 oz (54.1 kg)  02/12/24 118 lb 6.4 oz (53.7 kg)      Objective:  Physical Exam Vitals and nursing note reviewed.  Constitutional:      General: She is not in acute distress.    Appearance: Normal appearance. She is well-developed.     Comments: Thin appearance  HENT:     Head: Normocephalic and atraumatic.  Eyes:     Pupils: Pupils are equal, round, and reactive to light.  Cardiovascular:     Rate and Rhythm: Normal rate and regular rhythm.     Pulses: Normal pulses.     Heart sounds: Normal heart sounds. No murmur heard. Pulmonary:     Effort: Pulmonary effort is normal. No respiratory distress.     Breath sounds: No wheezing.  Musculoskeletal:        General: Normal range of motion.     Comments: Slow gait and using rolling walker  Skin:    General: Skin is warm.     Findings: Wound (sacral area and buttocks, erythema and small open area. No drainage noted) present.  Neurological:     General: No focal deficit present.     Mental Status: She is alert and oriented to person, place, and time.     Cranial Nerves: No cranial nerve deficit.     Motor: No weakness.  Psychiatric:        Mood and Affect: Mood normal.         Assessment And Plan:   Assessment & Plan Parkinson's disease without dyskinesia, unspecified whether manifestations fluctuate (HCC) Difficulty standing due to medication timing per her daughter. Considering Parkinson's pump for better 'on' times. - Consider Parkinson's pump to improve 'on' times and reduce 'off' times. Wound of sacral region, subsequent encounter Wound improving with skin regeneration, no drainage, mild odor, no infection. - Continue cleaning with mild soap and water. - Apply Medihoney. - Use Tegaderm dressing, avoiding adhesive contact with redness. - Ordered home health services for evaluation and management. - Consider non-adhesive pads  to prevent irritation. - Change dressing daily unless soiled. Urine white blood cells increased Small leukocytes in urine will send urine culture  Orders Placed This Encounter  Procedures   Culture, Urine   Ambulatory referral to Home Health   POCT URINALYSIS DIP (CLINITEK)     Return if symptoms worsen or fail to improve.  Patient was given opportunity to ask questions. Patient verbalized understanding of the plan and was able to repeat  key elements of the plan. All questions were answered to their satisfaction.    LILLETTE Denise Ada, FNP, have reviewed all documentation for this visit. The documentation on 07/09/2024 for the exam, diagnosis, procedures, and orders are all accurate and complete.   IF YOU HAVE BEEN REFERRED TO A SPECIALIST, IT MAY TAKE 1-2 WEEKS TO SCHEDULE/PROCESS THE REFERRAL. IF YOU HAVE NOT HEARD FROM US /SPECIALIST IN TWO WEEKS, PLEASE GIVE US  A CALL AT [phone].      [1]  Current Outpatient Medications:    alendronate  (FOSAMAX ) 70 MG tablet, TAKE 1 TABLET (70 MG TOTAL) BY MOUTH EVERY 7 DAYS WITH FULL GLASS WATER ON EMPTY STOMACH, Disp: 12 tablet, Rfl: 1   atorvastatin  (LIPITOR) 10 MG tablet, TAKE 1 TABLET BY MOUTH EVERY DAY AT BEDTIME MONDAY THROUGH FRIDAY, Disp: 65 tablet, Rfl: 4   Calcium  Carb-Cholecalciferol (OYSTER SHELL CALCIUM /VITAMIN D ) 500-200 MG-UNIT PACK, Take by mouth., Disp: , Rfl:    carbidopa -levodopa  (SINEMET  IR) 25-100 MG tablet, Take 1 tablet by mouth at 8am,12pm, 4pm and 8pm. Try to avoid protein. May take an extra during the day if needed., Disp: , Rfl:    cyanocobalamin  (VITAMIN B12) 1000 MCG tablet, Take 1 tablet (1,000 mcg total) by mouth daily., Disp: , Rfl:    dorzolamide-timolol (COSOPT) 2-0.5 % ophthalmic solution, Place 1 drop into the left eye 2 (two) times daily., Disp: , Rfl:    hydrochlorothiazide  (HYDRODIURIL ) 12.5 MG tablet, TAKE 1 TABLET BY MOUTH EVERY DAY AS NEEDED FOR LEG SWELLING AS DIRECTED, Disp: 90 tablet, Rfl: 1    QUEtiapine  (SEROQUEL ) 25 MG tablet, Take 3 tabs pm - Dr. Lori, Disp: , Rfl:    REXULTI  3 MG TABS, Take 0.5 tablets by mouth every morning., Disp: , Rfl:    Travoprost, BAK Free, (TRAVATAN) 0.004 % SOLN ophthalmic solution, 1 drop at bedtime., Disp: , Rfl:    Vitamin D , Ergocalciferol , (DRISDOL ) 1.25 MG (50000 UNIT) CAPS capsule, TAKE 1 CAPSULE (50,000 UNITS TOTAL) BY MOUTH EVERY 7 (SEVEN) DAYS, Disp: 12 capsule, Rfl: 0   Zinc Oxide (DESITIN) 13 % CREA, Apply 1 Application topically 3 (three) times daily as needed., Disp: , Rfl:    REXULTI  1 MG TABS tablet, Take 1 mg by mouth daily. (Patient not taking: Reported on 07/09/2024), Disp: , Rfl:  [2]  Allergies Allergen Reactions   Nystatin -Triamcinolone  Swelling    BLISTERS   Triamcinolone  Acetonide     Blisters    Cephalexin  Itching and Rash

## 2024-07-12 LAB — URINE CULTURE

## 2024-07-15 NOTE — Assessment & Plan Note (Signed)
 Difficulty standing due to medication timing per her daughter. Considering Parkinson's pump for better 'on' times. - Consider Parkinson's pump to improve 'on' times and reduce 'off' times.

## 2024-07-16 ENCOUNTER — Encounter: Payer: Self-pay | Admitting: Nurse Practitioner

## 2024-07-18 ENCOUNTER — Other Ambulatory Visit: Payer: Self-pay | Admitting: Nurse Practitioner

## 2024-07-18 MED ORDER — NITROFURANTOIN MONOHYD MACRO 100 MG PO CAPS: 100.0000 mg | ORAL_CAPSULE | Freq: Two times a day (BID) | ORAL | 0 refills | Status: AC

## 2024-07-19 ENCOUNTER — Other Ambulatory Visit: Payer: Self-pay | Admitting: Licensed Clinical Social Worker

## 2024-07-19 NOTE — Patient Instructions (Signed)
 Visit Information  Thank you for taking time to visit with me today. Please don't hesitate to contact me if I can be of assistance to you before our next scheduled appointment.  Your next care management appointment is by telephone on 2/20 at 3:30 PM  Please call the care guide team at [phone] if you need to cancel, schedule, or reschedule an appointment.   Please call the Suicide and Crisis Lifeline: 988 go to [facility] [address] [place] [id number]) call 911 if you are experiencing a Mental Health or Behavioral Health Crisis or need someone to talk to.  Rolin Kerns, LCSW [place]  [facility], [facility] Clinical Social Worker Direct Dial: [phone]  Fax: [phone] Website: [email or web address] 5:41 PM

## 2024-07-19 NOTE — Patient Outreach (Signed)
 Complex Care Management   Visit Note  07/19/2024  Name:  Denise [name] MRN: [id number] DOB: 1950/11/14  Situation: Referral received for Complex Care Management related to Caregiver Stress I obtained verbal consent from Caregiver.  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    Assessment: Patient Reported Symptoms:  Cognitive Cognitive Status: No symptoms reported, Requires Assistance Decision Making Cognitive/Intellectual Conditions Management [RPT]: None reported or documented in medical history or problem list   Health Maintenance Behaviors: Annual physical exam  Neurological Neurological Review of Symptoms: Not assessed    HEENT HEENT Symptoms Reported: No symptoms reported      Cardiovascular Cardiovascular Symptoms Reported: Not assessed    Respiratory Respiratory Symptoms Reported: No symptoms reported    Endocrine Endocrine Symptoms Reported: No symptoms reported    Gastrointestinal Gastrointestinal Symptoms Reported: No symptoms reported      Genitourinary Genitourinary Symptoms Reported: Not assessed    Integumentary Integumentary Symptoms Reported: Wound Skin Management Strategies: Coping strategies, Medication therapy, Routine screening, Dressing changes Skin Comment: Caregiver reports that pt's wound is closing; however, there is patchy skin. HH services have began to assist with wound care  Musculoskeletal Musculoskelatal Symptoms Reviewed: Unsteady gait Musculoskeletal Management Strategies: Coping strategies, Routine screening, Exercise, Medication therapy   Patient at Risk for Falls Due to: Impaired  balance/gait, Impaired mobility Fall risk Follow up: Falls prevention discussed  Psychosocial Psychosocial Symptoms Reported: Other Other Psychosocial Conditions: Caregiver Stress Additional Psychological Details: Caregiver reports a decrease in agitation and/or aggressive behaviors with medication management Behavioral Management Strategies: Adequate rest, Coping strategies, Support system, Medication therapy Major Change/Loss/Stressor/Fears (CP): Medical condition, self Techniques to Cope with Loss/Stress/Change: Diversional activities, Medication Quality of Family Relationships: helpful, involved, supportive    07/19/2024    PHQ2-9 Depression Screening   Little interest or pleasure in doing things    Feeling down, depressed, or hopeless    PHQ-2 - Total Score    Trouble falling or staying asleep, or sleeping too much    Feeling tired or having little energy    Poor appetite or overeating     Feeling bad about yourself - or that you are a failure or have let yourself or your family down    Trouble concentrating on things, such as reading the newspaper or watching television    Moving or speaking so slowly that other people could have noticed.  Or the opposite - being so fidgety or restless that you have been moving around a lot more than usual    Thoughts that you would be better off dead, or hurting yourself in some way    PHQ2-9 Total Score    If you checked off any problems, how difficult have these problems made it for you to do your work, take care of things at home, or get along with other people    Depression Interventions/Treatment      There were no vitals filed for this visit.    Medications Reviewed Today     Reviewed by Dee Paden D, LCSW (Social Worker) on  07/19/24 at 1732  Med List Status: <None>   Medication Order Taking? Sig Documenting Provider Last Dose Status Informant  alendronate  (FOSAMAX ) 70 MG tablet [id number]  TAKE 1 TABLET (70 MG TOTAL) BY MOUTH EVERY  7 DAYS WITH FULL GLASS WATER ON EMPTY STOMACH Georgina Speaks, FNP  Active   atorvastatin  (LIPITOR) 10 MG tablet [id number]  TAKE 1 TABLET BY MOUTH EVERY DAY AT BEDTIME MONDAY THROUGH FRIDAY Moore, Janece, FNP  Active   Calcium  Carb-Cholecalciferol (OYSTER SHELL CALCIUM /VITAMIN D ) 500-200 MG-UNIT PACK [id number]  Take by mouth. [provider]  Active   carbidopa -levodopa  (SINEMET  IR) 25-100 MG tablet [id number]  Take 1 tablet by mouth at 8am,12pm, 4pm and 8pm. Try to avoid protein. May take an extra during the day if needed. Georgina Speaks, FNP  Active   cyanocobalamin  (VITAMIN B12) 1000 MCG tablet [id number]  Take 1 tablet (1,000 mcg total) by mouth daily. Georgina Speaks, FNP  Active   dorzolamide-timolol (COSOPT) 2-0.5 % ophthalmic solution [id number]  Place 1 drop into the left eye 2 (two) times daily. [provider]  Active   hydrochlorothiazide  (HYDRODIURIL ) 12.5 MG tablet [id number]  TAKE 1 TABLET BY MOUTH EVERY DAY AS NEEDED FOR LEG SWELLING AS DIRECTED Georgina Speaks, FNP  Active   nitrofurantoin , macrocrystal-monohydrate, (MACROBID ) 100 MG capsule [id number]  Take 1 capsule (100 mg total) by mouth 2 (two) times daily for 7 days. Georgina Speaks, FNP  Active   QUEtiapine  (SEROQUEL ) 25 MG tablet [id number]  Take 3 tabs pm - Dr. Lori Georgina, Speaks, FNP  Active   REXULTI  1 MG TABS tablet [id number]  Take 1 mg by mouth daily.  Patient not taking: Reported on 07/09/2024   [provider]  Active   REXULTI  3 MG TABS [id number]  Take 0.5 tablets by mouth every morning. [provider]  Active   Travoprost, BAK Free, (TRAVATAN) 0.004 % SOLN ophthalmic solution [id number]  1 drop at bedtime. [provider]  Active   Vitamin D , Ergocalciferol , (DRISDOL ) 1.25 MG (50000 UNIT) CAPS capsule [id number]  TAKE 1 CAPSULE (50,000 UNITS TOTAL) BY MOUTH EVERY 7 (SEVEN) DAYS Georgina Speaks, FNP  Active   Zinc Oxide (DESITIN) 13 % CREA [id number]  Apply 1 Application topically 3 (three)  times daily as needed. Georgina Speaks, FNP  Active             Recommendation:   Continue Current Plan of Care  Follow Up Plan:   Telephone follow-up in 1 month  Rolin Kerns, LCSW [facility]  [facility], [facility] Clinical Social Worker Direct Dial: [phone]  Fax: [phone] Website: [email or web address] 5:41 PM

## 2024-08-06 ENCOUNTER — Other Ambulatory Visit: Payer: Self-pay | Admitting: Nurse Practitioner

## 2024-08-14 ENCOUNTER — Encounter: Payer: Medicare Other | Admitting: Nurse Practitioner

## 2024-08-23 ENCOUNTER — Telehealth: Admitting: Licensed Clinical Social Worker

## 2024-12-25 ENCOUNTER — Ambulatory Visit: Payer: Self-pay
